# Patient Record
Sex: Male | Born: 1950 | Race: White | Hispanic: No | Marital: Married | Smoking: Never smoker
Health system: Southern US, Community
[De-identification: ages and names within clinical notes are randomized; demographics above are authoritative.]

## PROBLEM LIST (undated history)

## (undated) DIAGNOSIS — D696 Thrombocytopenia, unspecified: Secondary | ICD-10-CM

## (undated) DIAGNOSIS — G4730 Sleep apnea, unspecified: Secondary | ICD-10-CM

## (undated) DIAGNOSIS — E119 Type 2 diabetes mellitus without complications: Secondary | ICD-10-CM

## (undated) DIAGNOSIS — R51 Headache: Secondary | ICD-10-CM

## (undated) DIAGNOSIS — M1990 Unspecified osteoarthritis, unspecified site: Secondary | ICD-10-CM

## (undated) DIAGNOSIS — I4892 Unspecified atrial flutter: Secondary | ICD-10-CM

## (undated) DIAGNOSIS — N183 Chronic kidney disease, stage 3 unspecified: Secondary | ICD-10-CM

## (undated) DIAGNOSIS — E039 Hypothyroidism, unspecified: Secondary | ICD-10-CM

## (undated) DIAGNOSIS — D509 Iron deficiency anemia, unspecified: Secondary | ICD-10-CM

## (undated) DIAGNOSIS — K5790 Diverticulosis of intestine, part unspecified, without perforation or abscess without bleeding: Secondary | ICD-10-CM

## (undated) DIAGNOSIS — Z8719 Personal history of other diseases of the digestive system: Secondary | ICD-10-CM

## (undated) DIAGNOSIS — I480 Paroxysmal atrial fibrillation: Secondary | ICD-10-CM

## (undated) DIAGNOSIS — I499 Cardiac arrhythmia, unspecified: Secondary | ICD-10-CM

## (undated) DIAGNOSIS — N529 Male erectile dysfunction, unspecified: Secondary | ICD-10-CM

## (undated) DIAGNOSIS — K219 Gastro-esophageal reflux disease without esophagitis: Secondary | ICD-10-CM

## (undated) DIAGNOSIS — R519 Headache, unspecified: Secondary | ICD-10-CM

## (undated) DIAGNOSIS — K279 Peptic ulcer, site unspecified, unspecified as acute or chronic, without hemorrhage or perforation: Secondary | ICD-10-CM

## (undated) DIAGNOSIS — C801 Malignant (primary) neoplasm, unspecified: Secondary | ICD-10-CM

## (undated) DIAGNOSIS — G4733 Obstructive sleep apnea (adult) (pediatric): Secondary | ICD-10-CM

## (undated) DIAGNOSIS — I5042 Chronic combined systolic (congestive) and diastolic (congestive) heart failure: Secondary | ICD-10-CM

## (undated) DIAGNOSIS — N200 Calculus of kidney: Secondary | ICD-10-CM

## (undated) DIAGNOSIS — I10 Essential (primary) hypertension: Secondary | ICD-10-CM

## (undated) DIAGNOSIS — E785 Hyperlipidemia, unspecified: Secondary | ICD-10-CM

## (undated) DIAGNOSIS — M810 Age-related osteoporosis without current pathological fracture: Secondary | ICD-10-CM

## (undated) DIAGNOSIS — D126 Benign neoplasm of colon, unspecified: Secondary | ICD-10-CM

## (undated) DIAGNOSIS — Z8546 Personal history of malignant neoplasm of prostate: Secondary | ICD-10-CM

## (undated) DIAGNOSIS — K259 Gastric ulcer, unspecified as acute or chronic, without hemorrhage or perforation: Secondary | ICD-10-CM

## (undated) DIAGNOSIS — K5792 Diverticulitis of intestine, part unspecified, without perforation or abscess without bleeding: Secondary | ICD-10-CM

## (undated) HISTORY — DX: Thrombocytopenia, unspecified: D69.6

## (undated) HISTORY — DX: Gastric ulcer, unspecified as acute or chronic, without hemorrhage or perforation: K25.9

## (undated) HISTORY — DX: Morbid (severe) obesity due to excess calories: E66.01

## (undated) HISTORY — DX: Sleep apnea, unspecified: G47.30

## (undated) HISTORY — DX: Obstructive sleep apnea (adult) (pediatric): G47.33

## (undated) HISTORY — DX: Calculus of kidney: N20.0

## (undated) HISTORY — DX: Diverticulosis of intestine, part unspecified, without perforation or abscess without bleeding: K57.90

## (undated) HISTORY — DX: Personal history of malignant neoplasm of prostate: Z85.46

## (undated) HISTORY — DX: Diverticulitis of intestine, part unspecified, without perforation or abscess without bleeding: K57.92

## (undated) HISTORY — DX: Hyperlipidemia, unspecified: E78.5

## (undated) HISTORY — PX: OTHER SURGICAL HISTORY: SHX169

## (undated) HISTORY — PX: UPPER GASTROINTESTINAL ENDOSCOPY: SHX188

## (undated) HISTORY — PX: ATRIAL FIBRILLATION ABLATION: SHX5732

## (undated) HISTORY — DX: Hypothyroidism, unspecified: E03.9

## (undated) HISTORY — DX: Type 2 diabetes mellitus without complications: E11.9

## (undated) HISTORY — DX: Personal history of other diseases of the digestive system: Z87.19

## (undated) HISTORY — DX: Gastro-esophageal reflux disease without esophagitis: K21.9

## (undated) HISTORY — DX: Age-related osteoporosis without current pathological fracture: M81.0

## (undated) HISTORY — DX: Male erectile dysfunction, unspecified: N52.9

## (undated) HISTORY — DX: Iron deficiency anemia, unspecified: D50.9

## (undated) HISTORY — DX: Peptic ulcer, site unspecified, unspecified as acute or chronic, without hemorrhage or perforation: K27.9

## (undated) HISTORY — DX: Benign neoplasm of colon, unspecified: D12.6

## (undated) HISTORY — DX: Unspecified atrial flutter: I48.92

---

## 1975-04-12 HISTORY — PX: WRIST SURGERY: SHX841

## 1977-04-11 HISTORY — PX: KNEE ARTHROSCOPY: SUR90

## 1990-04-11 DIAGNOSIS — D696 Thrombocytopenia, unspecified: Secondary | ICD-10-CM

## 1990-04-11 HISTORY — DX: Thrombocytopenia, unspecified: D69.6

## 1992-04-11 DIAGNOSIS — K279 Peptic ulcer, site unspecified, unspecified as acute or chronic, without hemorrhage or perforation: Secondary | ICD-10-CM

## 1992-04-11 HISTORY — DX: Peptic ulcer, site unspecified, unspecified as acute or chronic, without hemorrhage or perforation: K27.9

## 1998-04-11 HISTORY — PX: OTHER SURGICAL HISTORY: SHX169

## 1999-02-02 ENCOUNTER — Ambulatory Visit (HOSPITAL_COMMUNITY): Admission: RE | Admit: 1999-02-02 | Discharge: 1999-02-02 | Payer: Self-pay | Admitting: Family Medicine

## 1999-02-02 ENCOUNTER — Encounter: Payer: Self-pay | Admitting: Family Medicine

## 2001-05-01 ENCOUNTER — Encounter: Payer: Self-pay | Admitting: Family Medicine

## 2001-05-01 ENCOUNTER — Encounter: Admission: RE | Admit: 2001-05-01 | Discharge: 2001-05-01 | Payer: Self-pay | Admitting: Family Medicine

## 2001-11-15 ENCOUNTER — Encounter: Payer: Self-pay | Admitting: Emergency Medicine

## 2001-11-16 ENCOUNTER — Inpatient Hospital Stay (HOSPITAL_COMMUNITY): Admission: EM | Admit: 2001-11-16 | Discharge: 2001-11-16 | Payer: Self-pay | Admitting: Emergency Medicine

## 2002-01-18 ENCOUNTER — Encounter: Payer: Self-pay | Admitting: Emergency Medicine

## 2002-01-18 ENCOUNTER — Emergency Department (HOSPITAL_COMMUNITY): Admission: EM | Admit: 2002-01-18 | Discharge: 2002-01-18 | Payer: Self-pay | Admitting: Emergency Medicine

## 2002-07-03 ENCOUNTER — Encounter: Payer: Self-pay | Admitting: Emergency Medicine

## 2002-07-03 ENCOUNTER — Emergency Department (HOSPITAL_COMMUNITY): Admission: EM | Admit: 2002-07-03 | Discharge: 2002-07-04 | Payer: Self-pay | Admitting: Emergency Medicine

## 2003-06-10 ENCOUNTER — Encounter: Admission: RE | Admit: 2003-06-10 | Discharge: 2003-06-10 | Payer: Self-pay | Admitting: Family Medicine

## 2004-01-01 ENCOUNTER — Encounter: Admission: RE | Admit: 2004-01-01 | Discharge: 2004-01-01 | Payer: Self-pay | Admitting: Family Medicine

## 2004-06-29 ENCOUNTER — Ambulatory Visit (HOSPITAL_COMMUNITY): Admission: RE | Admit: 2004-06-29 | Discharge: 2004-06-29 | Payer: Self-pay | Admitting: Gastroenterology

## 2005-04-07 ENCOUNTER — Inpatient Hospital Stay (HOSPITAL_COMMUNITY): Admission: AD | Admit: 2005-04-07 | Discharge: 2005-04-09 | Payer: Self-pay | Admitting: Gastroenterology

## 2005-07-26 ENCOUNTER — Emergency Department (HOSPITAL_COMMUNITY): Admission: EM | Admit: 2005-07-26 | Discharge: 2005-07-26 | Payer: Self-pay | Admitting: Emergency Medicine

## 2006-01-26 ENCOUNTER — Encounter: Admission: RE | Admit: 2006-01-26 | Discharge: 2006-01-26 | Payer: Self-pay | Admitting: Family Medicine

## 2006-04-24 ENCOUNTER — Encounter (INDEPENDENT_AMBULATORY_CARE_PROVIDER_SITE_OTHER): Payer: Self-pay | Admitting: Specialist

## 2006-04-24 ENCOUNTER — Ambulatory Visit (HOSPITAL_COMMUNITY): Admission: RE | Admit: 2006-04-24 | Discharge: 2006-04-25 | Payer: Self-pay | Admitting: Surgery

## 2006-08-23 ENCOUNTER — Emergency Department (HOSPITAL_COMMUNITY): Admission: EM | Admit: 2006-08-23 | Discharge: 2006-08-23 | Payer: Self-pay | Admitting: Emergency Medicine

## 2007-01-11 ENCOUNTER — Ambulatory Visit (HOSPITAL_COMMUNITY): Admission: RE | Admit: 2007-01-11 | Discharge: 2007-01-11 | Payer: Self-pay | Admitting: Urology

## 2007-02-10 HISTORY — PX: LAPAROSCOPIC RETROPUBIC PROSTATECTOMY: SUR794

## 2007-04-12 HISTORY — PX: TOTAL THYROIDECTOMY: SHX2547

## 2007-08-07 ENCOUNTER — Ambulatory Visit (HOSPITAL_COMMUNITY): Admission: RE | Admit: 2007-08-07 | Discharge: 2007-08-07 | Payer: Self-pay | Admitting: Family Medicine

## 2007-08-07 ENCOUNTER — Encounter (INDEPENDENT_AMBULATORY_CARE_PROVIDER_SITE_OTHER): Payer: Self-pay | Admitting: Family Medicine

## 2007-08-08 ENCOUNTER — Ambulatory Visit: Payer: Self-pay | Admitting: Vascular Surgery

## 2008-04-11 DIAGNOSIS — C801 Malignant (primary) neoplasm, unspecified: Secondary | ICD-10-CM

## 2008-04-11 HISTORY — DX: Malignant (primary) neoplasm, unspecified: C80.1

## 2008-05-09 ENCOUNTER — Encounter (INDEPENDENT_AMBULATORY_CARE_PROVIDER_SITE_OTHER): Payer: Self-pay | Admitting: Cardiology

## 2008-05-09 ENCOUNTER — Inpatient Hospital Stay (HOSPITAL_COMMUNITY): Admission: EM | Admit: 2008-05-09 | Discharge: 2008-05-15 | Payer: Self-pay | Admitting: Emergency Medicine

## 2008-05-13 ENCOUNTER — Encounter (INDEPENDENT_AMBULATORY_CARE_PROVIDER_SITE_OTHER): Payer: Self-pay | Admitting: Cardiology

## 2008-05-19 ENCOUNTER — Ambulatory Visit (HOSPITAL_COMMUNITY): Admission: RE | Admit: 2008-05-19 | Discharge: 2008-05-20 | Payer: Self-pay | Admitting: Cardiology

## 2008-05-26 ENCOUNTER — Ambulatory Visit (HOSPITAL_COMMUNITY): Admission: RE | Admit: 2008-05-26 | Discharge: 2008-05-26 | Payer: Self-pay | Admitting: Cardiology

## 2008-06-09 ENCOUNTER — Ambulatory Visit: Payer: Self-pay | Admitting: Internal Medicine

## 2009-02-10 ENCOUNTER — Emergency Department (HOSPITAL_COMMUNITY): Admission: EM | Admit: 2009-02-10 | Discharge: 2009-02-10 | Payer: Self-pay | Admitting: Emergency Medicine

## 2009-02-16 ENCOUNTER — Ambulatory Visit (HOSPITAL_BASED_OUTPATIENT_CLINIC_OR_DEPARTMENT_OTHER): Admission: RE | Admit: 2009-02-16 | Discharge: 2009-02-16 | Payer: Self-pay | Admitting: Urology

## 2009-07-10 ENCOUNTER — Ambulatory Visit: Payer: Self-pay | Admitting: Vascular Surgery

## 2010-05-02 ENCOUNTER — Encounter: Payer: Self-pay | Admitting: Family Medicine

## 2010-05-12 ENCOUNTER — Encounter: Payer: Self-pay | Admitting: Gastroenterology

## 2010-07-14 LAB — URINALYSIS, ROUTINE W REFLEX MICROSCOPIC
Leukocytes, UA: NEGATIVE
Nitrite: NEGATIVE
Specific Gravity, Urine: 1.016 (ref 1.005–1.030)

## 2010-07-14 LAB — PROTIME-INR
INR: 1.14 (ref 0.00–1.49)
Prothrombin Time: 14.5 seconds (ref 11.6–15.2)
Prothrombin Time: 21.2 seconds — ABNORMAL HIGH (ref 11.6–15.2)

## 2010-07-14 LAB — CBC: HCT: 38.2 % — ABNORMAL LOW (ref 39.0–52.0)

## 2010-07-14 LAB — URINE MICROSCOPIC-ADD ON

## 2010-07-14 LAB — BASIC METABOLIC PANEL
BUN: 12 mg/dL (ref 6–23)
BUN: 15 mg/dL (ref 6–23)
CO2: 27 mEq/L (ref 19–32)
CO2: 28 mEq/L (ref 19–32)
Calcium: 8.3 mg/dL — ABNORMAL LOW (ref 8.4–10.5)
Chloride: 100 mEq/L (ref 96–112)
Chloride: 107 mEq/L (ref 96–112)
Creatinine, Ser: 1.22 mg/dL (ref 0.4–1.5)
GFR calc Af Amer: >60 mL/min (ref >60)
GFR calc non Af Amer: >60 mL/min (ref >60)
Glucose, Bld: 132 mg/dL — ABNORMAL HIGH (ref 70–99)
Glucose, Bld: 182 mg/dL — ABNORMAL HIGH (ref 70–99)
Potassium: 2.9 mEq/L — ABNORMAL LOW (ref 3.5–5.1)
Potassium: 3.4 mEq/L — ABNORMAL LOW (ref 3.5–5.1)
Sodium: 141 mEq/L (ref 135–145)
Sodium: 141 mEq/L (ref 135–145)

## 2010-07-14 LAB — DIFFERENTIAL
Basophils Absolute: 0 10*3/uL (ref 0.0–0.1)
Basophils Relative: 0 % (ref 0–1)
Eosinophils Absolute: 0.1 10*3/uL (ref 0.0–0.7)
Eosinophils Relative: 1 % (ref 0–5)
Lymphs Abs: 1.4 10*3/uL (ref 0.7–4.0)
Monocytes Absolute: 0.8 10*3/uL (ref 0.1–1.0)
Monocytes Relative: 6 % (ref 3–12)
Neutrophils Relative %: 83 % — ABNORMAL HIGH (ref 43–77)

## 2010-07-14 LAB — APTT
aPTT: 41 seconds — ABNORMAL HIGH (ref 24–37)
aPTT: 43 seconds — ABNORMAL HIGH (ref 24–37)

## 2010-07-26 LAB — CBC
HCT: 38.1 % — ABNORMAL LOW (ref 39.0–52.0)
HCT: 41 % (ref 39.0–52.0)
Hemoglobin: 12.7 g/dL — ABNORMAL LOW (ref 13.0–17.0)
Hemoglobin: 14 g/dL (ref 13.0–17.0)
MCHC: 33.3 g/dL (ref 30.0–36.0)
MCHC: 34.1 g/dL (ref 30.0–36.0)
MCV: 90.8 fL (ref 78.0–100.0)
Platelets: 224 10*3/uL (ref 150–400)
RBC: 4.2 MIL/uL — ABNORMAL LOW (ref 4.22–5.81)
RBC: 4.26 MIL/uL (ref 4.22–5.81)
RDW: 14.9 % (ref 11.5–15.5)
RDW: 15.1 % (ref 11.5–15.5)
WBC: 10.7 10*3/uL — ABNORMAL HIGH (ref 4.0–10.5)

## 2010-07-26 LAB — APTT: aPTT: 32 seconds (ref 24–37)

## 2010-07-26 LAB — URINALYSIS, ROUTINE W REFLEX MICROSCOPIC
Glucose, UA: NEGATIVE mg/dL
Ketones, ur: 15 mg/dL — AB
Nitrite: NEGATIVE
pH: 7 (ref 5.0–8.0)

## 2010-07-26 LAB — CARDIAC PANEL(CRET KIN+CKTOT+MB+TROPI)
CK, MB: 2.3 ng/mL (ref 0.3–4.0)
CK, MB: 2.4 ng/mL (ref 0.3–4.0)
Relative Index: 1.4 (ref 0.0–2.5)
Total CK: 168 U/L (ref 7–232)

## 2010-07-26 LAB — BASIC METABOLIC PANEL
CO2: 22 mEq/L (ref 19–32)
Calcium: 8.5 mg/dL (ref 8.4–10.5)
Creatinine, Ser: 1.21 mg/dL (ref 0.4–1.5)
GFR calc Af Amer: >60 mL/min (ref >60)
GFR calc non Af Amer: >60 mL/min (ref >60)

## 2010-07-26 LAB — POCT CARDIAC MARKERS
CKMB, poc: 1.4 ng/mL (ref 1.0–8.0)
Myoglobin, poc: 201 ng/mL (ref 12–200)

## 2010-07-26 LAB — POCT I-STAT, CHEM 8
HCT: 43 % (ref 39.0–52.0)
Hemoglobin: 14.6 g/dL (ref 13.0–17.0)
Potassium: 3.7 mEq/L (ref 3.5–5.1)
Sodium: 141 mEq/L (ref 135–145)
TCO2: 18 mmol/L (ref 0–100)

## 2010-07-26 LAB — HEPARIN LEVEL (UNFRACTIONATED)
Heparin Unfractionated: 0.17 IU/mL — ABNORMAL LOW (ref 0.30–0.70)
Heparin Unfractionated: 0.21 IU/mL — ABNORMAL LOW (ref 0.30–0.70)
Heparin Unfractionated: 0.24 IU/mL — ABNORMAL LOW (ref 0.30–0.70)

## 2010-07-26 LAB — PROTIME-INR
INR: 1.2 (ref 0.00–1.49)
Prothrombin Time: 14.9 seconds (ref 11.6–15.2)
Prothrombin Time: 14.9 seconds (ref 11.6–15.2)
Prothrombin Time: 15.1 seconds (ref 11.6–15.2)

## 2010-07-26 LAB — CK TOTAL AND CKMB (NOT AT ARMC)
CK, MB: 2.8 ng/mL (ref 0.3–4.0)
Relative Index: 1.1 (ref 0.0–2.5)
Total CK: 247 U/L — ABNORMAL HIGH (ref 7–232)

## 2010-07-26 LAB — DIFFERENTIAL
Basophils Absolute: 0 10*3/uL (ref 0.0–0.1)
Eosinophils Relative: 0 % (ref 0–5)
Lymphocytes Relative: 8 % — ABNORMAL LOW (ref 12–46)
Lymphs Abs: 0.9 10*3/uL (ref 0.7–4.0)
Monocytes Absolute: 0.2 10*3/uL (ref 0.1–1.0)
Neutro Abs: 9.6 10*3/uL — ABNORMAL HIGH (ref 1.7–7.7)

## 2010-07-26 LAB — TROPONIN I: Troponin I: 0.02 ng/mL (ref 0.00–0.06)

## 2010-07-26 LAB — TSH: TSH: 1.823 u[IU]/mL (ref 0.350–4.500)

## 2010-07-27 LAB — HEPARIN LEVEL (UNFRACTIONATED)
Heparin Unfractionated: 0.21 IU/mL — ABNORMAL LOW (ref 0.30–0.70)
Heparin Unfractionated: 0.67 IU/mL (ref 0.30–0.70)
Heparin Unfractionated: 0.88 IU/mL — ABNORMAL HIGH (ref 0.30–0.70)
Heparin Unfractionated: 1.23 IU/mL — ABNORMAL HIGH (ref 0.30–0.70)

## 2010-07-27 LAB — CBC
HCT: 43.2 % (ref 39.0–52.0)
Hemoglobin: 14.9 g/dL (ref 13.0–17.0)
Hemoglobin: 15.2 g/dL (ref 13.0–17.0)
MCHC: 32.6 g/dL (ref 30.0–36.0)
MCHC: 32.8 g/dL (ref 30.0–36.0)
MCHC: 34 g/dL (ref 30.0–36.0)
MCHC: 34.2 g/dL (ref 30.0–36.0)
MCHC: 34.6 g/dL (ref 30.0–36.0)
MCHC: 34.8 g/dL (ref 30.0–36.0)
MCV: 89 fL (ref 78.0–100.0)
MCV: 89.3 fL (ref 78.0–100.0)
MCV: 89.6 fL (ref 78.0–100.0)
MCV: 89.7 fL (ref 78.0–100.0)
Platelets: 211 10*3/uL (ref 150–400)
Platelets: 230 10*3/uL (ref 150–400)
Platelets: 256 10*3/uL (ref 150–400)
RBC: 4.84 MIL/uL (ref 4.22–5.81)
RDW: 14.9 % (ref 11.5–15.5)
RDW: 14.9 % (ref 11.5–15.5)
RDW: 15 % (ref 11.5–15.5)
RDW: 15.2 % (ref 11.5–15.5)
WBC: 7.1 10*3/uL (ref 4.0–10.5)

## 2010-07-27 LAB — APTT
aPTT: 164 seconds — ABNORMAL HIGH (ref 24–37)
aPTT: 60 seconds — ABNORMAL HIGH (ref 24–37)

## 2010-07-27 LAB — BASIC METABOLIC PANEL
BUN: 15 mg/dL (ref 6–23)
CO2: 20 mEq/L (ref 19–32)
CO2: 21 mEq/L (ref 19–32)
CO2: 26 mEq/L (ref 19–32)
CO2: 27 mEq/L (ref 19–32)
Calcium: 8.8 mg/dL (ref 8.4–10.5)
Calcium: 9.5 mg/dL (ref 8.4–10.5)
Chloride: 105 mEq/L (ref 96–112)
Chloride: 108 mEq/L (ref 96–112)
Chloride: 110 mEq/L (ref 96–112)
Chloride: 110 mEq/L (ref 96–112)
Creatinine, Ser: 1.3 mg/dL (ref 0.4–1.5)
Creatinine, Ser: 1.88 mg/dL — ABNORMAL HIGH (ref 0.4–1.5)
GFR calc Af Amer: >60 mL/min (ref >60)
Glucose, Bld: 113 mg/dL — ABNORMAL HIGH (ref 70–99)
Glucose, Bld: 146 mg/dL — ABNORMAL HIGH (ref 70–99)
Potassium: 3.6 mEq/L (ref 3.5–5.1)
Potassium: 4.2 mEq/L (ref 3.5–5.1)
Sodium: 137 mEq/L (ref 135–145)
Sodium: 137 mEq/L (ref 135–145)
Sodium: 143 mEq/L (ref 135–145)

## 2010-07-27 LAB — PROTIME-INR
INR: 1.3 (ref 0.00–1.49)
INR: 2.6 — ABNORMAL HIGH (ref 0.00–1.49)
Prothrombin Time: 18.8 seconds — ABNORMAL HIGH (ref 11.6–15.2)
Prothrombin Time: 21.1 seconds — ABNORMAL HIGH (ref 11.6–15.2)
Prothrombin Time: 30.2 seconds — ABNORMAL HIGH (ref 11.6–15.2)

## 2010-08-24 NOTE — Discharge Summary (Signed)
NAME:  [name] [name], [name] [name] NO.:  [id number]   [name]          PATIENT TYPE:  INP   LOCATION:  3738                         FACILITY:  [facility]   PHYSICIAN:  Francisca December, M.D.  DATE OF BIRTH:  May 22, 1950   DATE OF ADMISSION:  05/09/2008  DATE OF DISCHARGE:  05/15/2008                               DISCHARGE SUMMARY   DISCHARGE DIAGNOSES:  1. Atrial fibrillation with rapid ventricular response, now back in      sinus rhythm.  2. 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  3. Acute systolic/acute diastolic heart failure, resolved.  4. Hypothyroidism, on replacement therapy.  5. Peptic ulcer disease on Protonix.  6. Long-term Coumadin therapy.  7. Hypertension.  8. History of stomach tumor removal, benign.  9. 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  10.Long-term medication use.   HOSPITAL COURSE:  [name] [name] is a 60 year old male patient who was  recently seen by his primary care physician for upper respiratory  symptoms that was felt to be related to reaction between Muse and  Viagra.  He was started on steroids, Ventolin, and covered with a  Zithromax Pak.  On the day of admission, he awoke with palpitations.  He  promptly saw his primary care physician and was found to be in AFib with  RVR.  He notes having intermittent palpitations over the past 2 years,  but he thought it was secondary to fluctuating thyroid levels.  Last TSH  from Dr. Larina Bras was normal.   In the emergency room, he was treated with IV heparin and IV Cardizem  with a heart rate down to the 90s.  He denied any chest pain.  He stated  he had an exercise treadmill at the office about 10 years ago that was  negative.   The patient was admitted and placed on IV Cardizem and IV heparin.  Ultimately, he required cardioversion on May 13, 2008, despite  medical therapy.  After 150 joules  synchronous biphasic shock, he  promptly reverted back to normal sinus rhythm.  His INR remained  subtherapeutic until the date of discharge, May 15, 2008, for which  his INR was 2.3.   A 2-D echo was performed that showed an EF of 45-50% but this was felt  to be a gross estimation given atrial fibrillation with RVR.  There was  a question whether or not this was tachycardia-induced cardiomyopathy.  There were no wall motion abnormalities.   Other lab studies during the patient's hospital stay included hemoglobin  of 13.1, hematocrit 37.7, white count 8.57, platelets 211.  Sodium 138,  potassium 3.9, BUN 15, creatinine 1.3.  TSH 1.823.  Cardiac markers  negative.   Chest x-ray showed diffuse pulmonary interstitial densities that are  chronic.  There was no edema.  EKG shows normal sinus rhythm, rate 65,  with no acute ST-T wave changes.   The patient is being discharged to home on May 15, 2008, in stable  but improved condition.   DISCHARGE MEDICATIONS:  1. Protonix 40 mg a day.  2. Imipramine 25 mg 3 tablets daily.  3. Levothyroxine 175 mcg a day.  4. Potassium 20 mEq daily.  5. Coumadin 5 mg 1-1/2 tablets daily.  6. Multaq 400 mg twice a day.  7. Coreg 12.5 mg p.o. twice a day.  8. Vasotec 5 mg p.o. twice a day.  9. Ambien 10 mg nightly p.r.n. sleep.   He is to continue his over-the-counter medications which include vitamin  D, glucosamine, magnesium, fish oil on a daily basis.  He is to continue  albuterol as needed for wheezing.  Viagra p.r.n.   He was on enalapril 20 mg a day and this was then switched as listed  above.  He is to stop his baby aspirin.   He is to remain on a low-sodium heart-healthy diet.  Increase activity  slowly.  He is not to return to work until May 30, 2008.  He is to  follow up in the [facility] on May 19, 2008, at 10:45 a.m.  Follow up with Dr. Deitra Mayo, nurse practitioner, on May 22, 2008, at 1:00 p.m.   An EKG will need to be done at that time.      Guy Franco, P.A.      Francisca December, M.D.  Electronically Signed    LB/MEDQ  D:  05/15/2008  T:  05/15/2008  Job:  [id number]   cc:   Dr. Larina Bras

## 2010-08-24 NOTE — Op Note (Signed)
NAME:  [name] [name], TOME NO.:  [id number]   [name]          PATIENT TYPE:  OIB   LOCATION:  2899                         FACILITY:  [facility]   PHYSICIAN:  Armanda Magic, M.D.     DATE OF BIRTH:  1950/05/28   DATE OF PROCEDURE:  05/26/2008  DATE OF DISCHARGE:  05/26/2008                               OPERATIVE REPORT   REFERRING PHYSICIAN:  Actually, I do not have the referring physician.   PROCEDURE:  Direct current cardioversion.   OPERATOR:  Armanda Magic, MD   INDICATIONS:  Atrial fibrillation, status post TEE cardioversion on  May 23, 2008, now back in atrial fibrillation.   COMPLICATIONS:  None.   IV MEDICATIONS:  Diprivan 80 mg IV.   This is a 60 year old male who has had several episodes of atrial  fibrillation and has been cardioverted twice now with reversion back to  AFib.  His last cardioversion was a week ago several days after being  TEE cardioverted.  He is adequately anticoagulated on Coumadin and now  presents back for repeat cardioversion after undergoing loading with  amiodarone.   The [facility] in the fasting nonsedated  state.  Informed consent was obtained.  The patient was connected to the  continuous heart rate, pulse oximetry monitoring, and intermittent blood  pressure monitoring.  Defibrillator pads were placed in the anterior  left chest and posterior left chest.  After adequate anesthesia was  obtained, a synchronized 150-joule biphasic shock was delivered, which  successfully converted the patient to sinus rhythm.  The patient  tolerated the procedure well without any complications.  The patient was  subsequently discharged to home after being fully awake and ambulating.   ASSESSMENT:  1. Atrial fibrillation, status post amiodarone loading with      therapeutic INR.  2. Successful cardioversion to normal sinus rhythm.  QTc is 464      milliseconds.  Heart rate is 89 beats  per minute.   PLAN:  Discharge to home after fully awake and ambulating.  Follow up  with Dr. Amil Amen on Tuesday, June 10, 2008, at 1 p.m.      Armanda Magic, M.D.  Electronically Signed     TT/MEDQ  D:  05/26/2008  T:  05/26/2008  Job:  [id number]   cc:   Francisca December, M.D.

## 2010-08-24 NOTE — Discharge Summary (Signed)
NAME:  MELIK, BLANCETT NO.:  [id number]   [name]          PATIENT TYPE:  OIB   LOCATION:  3735                         FACILITY:  [facility]   PHYSICIAN:  Francisca December, M.D.  DATE OF BIRTH:  19-Feb-1951   DATE OF ADMISSION:  05/19/2008  DATE OF DISCHARGE:  05/20/2008                               DISCHARGE SUMMARY   DISCHARGE DIAGNOSES:  1. Atrial fibrillation status post cardioversion and amiodarone      loading.  2. 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  3. History of congestive heart failure.  4. Hypothyroidism, on replacement therapy.  5. Peptic ulcer disease, on Protonix.  6. Long-term Coumadin therapy.  7. Hypertension.  8. History of stomach tumor, removed, benign.  9. 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  10.Long-term medication use.   HOSPITAL COURSE:  Mr. Virginia is a 60 year old patient who was just  released from the hospital on May 15, 2008 after being in atrial  fibrillation with RVR.  When he was discharged, he was back in normal  sinus rhythm.  During the hospitalization, he ultimately required  cardioversion despite being on IV Cardizem.  He reverted back to sinus  rhythm and was placed on Coumadin therapy.  On discharge, his INR was  2.3.   A 2-D echo was performed during that stay that showed an EF of 40-45%  but this was felt to be a gross estimation given his atrial fibrillation  with RVR.  There was a question of whether or not this was tachycardia-  induced cardiomyopathy.  There were no wall motion abnormalities.   The patient started having problem with feeling lightheaded, shortness  of breath, and palpitations and was seen again in the office on May 19, 2008.  He was found again to be in atrial fibrillation.  He was then  admitted to [facility] for cardioversion.  He was cardioverted but  ultimately required a 150 mg  bolus of amiodarone to keep him in normal  sinus rhythm.  He was kept in the hospital overnight and placed on IV  amiodarone drip and he remained in sinus rhythm overnight and therefore  we felt it was safe for him to go home the following day on an  amiodarone load.   Of note, during his stay, his creatinine was noted to be at 1.88 with a  BUN of 28 and potassium of 5.5.  This was felt to be secondary to ACE  inhibitor that had been started on his last hospitalization.  ACE  inhibitor was stopped.  He had also been on potassium replacement and  this was stopped as well.  The following day, after a general fluid  hydration, his BUN was down to 24, his creatinine was 1.66, his  potassium was down to 4.2, and his Protime was 29.9 with an INR of 2.6.   He is being discharged to home in stable but improved condition.  He is  to remain on a low-sodium heart-healthy diet.  Increase activity slowly.  Follow up at the Coumadin  Clinic on May 23, 2008 at 11:30 a.m. and  he is to follow up that same morning in our office for an adenosine  Cardiolite.  He is then to follow up with Tillman Sers, nurse  practitioner for Dr. Amil Amen on June 02, 2008 at 1:30 p.m.   DISCHARGE MEDICATIONS:  1. Amiodarone 200 mg 2 tablets p.o. b.i.d. for 1 week, then 2 tablets      p.o. daily for 1 week, then 1 p.o. daily thereafter.  2. Coumadin 5 mg a day.  3. Protonix 40 mg a day.  4. Imipramine 25 mg 3 tablets daily.  5. Levothyroxine 175 mcg a day.  6. Coreg 12.5 mg twice a day.  7. Ambien 10 mg at bedtime p.r.n. sleep.   He may resume his vitamin D, glucosamine, magnesium, fish oil,  albuterol, and Viagra as prior to admission.   DISPOSITION:  He is being discharged to home in stable but improved  condition.      Guy Franco, P.A.      Francisca December, M.D.  Electronically Signed    LB/MEDQ  D:  05/20/2008  T:  05/21/2008  Job:  [id number]   cc:   Ace Gins, MD

## 2010-08-24 NOTE — H&P (Signed)
NAME:  [name] [name], SWINGLER NO.:  [id number]   [name]          PATIENT TYPE:  INP   LOCATION:  3735                         FACILITY:  [facility]   PHYSICIAN:  Francisca December, M.D.  DATE OF BIRTH:  [birth year]   DATE OF ADMISSION:  05/09/2008  DATE OF DISCHARGE:                              HISTORY & PHYSICAL   CHIEF COMPLAINT:  Atrial fibrillation.   HISTORY OF PRESENT ILLNESS:  Mr. Stefanski is a 60 year old male patient  who was recently seen by primary care physician for upper respiratory  symptoms felt secondary to reaction between Muse and Viagra.  At that  point, he was started on steroids, Ventolin, and Zithromax PAK.  He  awoke this morning with palpitation and he saw his primary care  physician and was found to be in atrial fibrillation with RVR.  The  patient notes that he has had intermittent palpitations over this past 2  years and they thought it might be secondary to his fluctuating thyroid  levels.  His last TSH was faxed over to the emergency room and was  normal in November 2009.  He is now in the ER on IV heparin, IV  Cardizem, and the heart rate in the 90s.  He has no chest pain.  He  apparently had some type of exercise treadmill test about 10 years ago  that was reported as negative.   PAST MEDICAL HISTORY:  1. 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  2. History of erosive antral gastritis.  3. 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  4. Hypertension.  5. Prostate cancer.  No chemotherapy.  6. History of stomach tumor removal (benign).   SOCIAL HISTORY:  He lives in [place] with his wife, has several  children.  Denies tobacco, alcohol, or illicit drug use.   FAMILY HISTORY:  No family history of coronary artery disease.   REVIEW OF SYSTEMS:  No shortness of breath, palpitations.  No melena.  Review of systems otherwise negative.   ALLERGIES:  NSAID and PHENERGAN.   MEDICATIONS:  1.  Protonix 40 mg a day.  2. Potassium 20 mEq a day.  3. Enalapril 20 mg a day.  4. Imipramine 25 mg 3 tablets daily.  5. Baby aspirin daily.  6. Vitamin D daily.  7. Magnesium daily.  8. Synthroid 175 mcg daily.  9. Fish oil.  10.Glucosamine.  11.Green tea.  12.Albuterol, which was begun 2 days ago.  13.Viagra p.r.n.  14.Prednisone 40 mg a day for 5 days, started this week.   PHYSICAL EXAMINATION:  VITAL SIGNS:  Temperature 97, pulse 90-100,  respirations 16, blood pressure 113/79, O2 saturation 99% on room air.  GENERAL:  He is in no acute distress.  HEENT:  Grossly normal.  No carotid or subclavian bruits.  No JVD or  thyromegaly.  Sclerae clear.  Conjunctivae normal.  Nares without  drainage.  CHEST:  Clear to auscultation.  No wheezing or rhonchi.  HEART:  Irregularly irregular.  No gross murmur.  ABDOMEN:  Good bowel sounds.  Nontender and nondistended.  No masses.  No bruits.  LOWER EXTREMITY:  No peripheral edema.  SKIN:  Warm and dry.  NEUROLOGIC:  Cranial nerves II through XII grossly intact.  Normal mood  and affect.   Chest x-ray shows mild cardiomegaly with prominent perihilar lung base  markings.  Possible interstitial edema.   LABORATORY DATA:  Hemoglobin 14, hematocrit 41, white count 10.7,  platelets 224.  Sodium 141, potassium 3.7, BUN 17, creatinine 1.3,  glucose 174, PT 14.9, INR 1.1.  Point-of-care markers 201/1.4 with  troponin of less than 0.05.  Urinalysis negative.   EKG, AFib with RVR, rate 143, LVH.   ASSESSMENT AND PLAN:  1. Atrial fibrillation with rapid ventricular response, now rate      controlled with IV Cardizem.  2. Hypothyroidism secondary to thyroidectomy.  3. History of gastritis.  4. History of stomach tumor status post removal, benign.  5. Hypertension.  6. 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   He is currently placed on IV heparin.  We need to go ahead and start  Coumadin load.  We will keep him on IV  Cardizem overnight and titrate as  needed.  We will have a cardiac isoenzymes and check a TSH.  He will at  some point also need an echocardiogram, which will perhaps be done as an  outpatient.   This patient was seen and examined by Dr. Corliss Marcus.      Guy Franco, P.A.      Francisca December, M.D.  Electronically Signed    LB/MEDQ  D:  05/09/2008  T:  05/10/2008  Job:  [id number]   cc:   Francisca December, M.D.  James L. Malon Kindle., M.D.  Annamary Rummage

## 2010-08-24 NOTE — Letter (Signed)
June 09, 2008    [name] December, MD  [address]  [address]  [place], [place] [address]   RE:  [name] [name], [name] [name]  MRN:  [id number]  /  DOB:  1950-12-13   Dear [name] [name],   It was my pleasure to see your patient, [name] [name] in  electrophysiology consultation today regarding therapeutic strategies  for his atrial arrhythmias.  As you recall, [name] [name] is a very  pleasant 60 year old gentleman with recently diagnosed atrial  fibrillation.  The patient reports initially being diagnosed with atrial  fibrillation on May 09, 2008, after presenting to Dr. Evon Slack office for further evaluation of fatigue and decreased exercise  tolerance.  At that time, he was noted to have atrial fibrillation with  a ventricular rate in the 170 beat per minute range.  He was therefore  admitted to [facility] under your care or her was placed on  intravenous heparin and Cardizem.  He was cardioverted on May 13, 2008, successfully to sinus rhythm.  Unfortunately, he remained in sinus  rhythm for approximately 48 hours before returning to atrial  fibrillation.  He is therefore initiated on Multaq 400 mg twice daily.  He returned to your office on May 19, 2008, and was again noted to  be in atrial fibrillation.  He was therefore successfully cardioverted  to sinus rhythm after initiation of an amiodarone drip on May 20, 2008.  He notes that he remained in sinus rhythm for a week before  developing recurrent atrial fibrillation.  He again underwent  cardioversion on May 26, 2008.  He has been maintained on  amiodarone since that time.  He reports intermittent sinus rhythm but  predominantly atrial fibrillation.  He is quite clear of when he is in  sinus rhythm.  He reports symptoms of fatigue, leg weakness, decreased  exercise capacity, palpitations, and shortness of breath during atrial  fibrillation.  He is concerned that his quality of life has been  severely  impacted by atrial fibrillation.  He has been initiated on  Coumadin and has had therapeutic INRs since the beginning of February.  He denies any neurologic sequela.  He presents today for further  evaluation and management.  Upon presentation today, he is noted to have  typical-appearing atrial flutter by EKG.   PAST MEDICAL HISTORY:  1. Persistent atrial fibrillation (as above).  2. Typical-appearing atrial flutter.  3. Nonischemic cardiomyopathy (ejection fraction by 2-D echo, May 09, 2008, 45-50%).  4. Hypertension.  5. Migraines.  6. Status post thyroidectomy, January 2009, for a benign thyroid      growth.  7. Recurrent renal calculi.  8. Idiopathic thrombocytopenia purpura, 1992 which is resolved.  9. Prostate cancer, status post robotic prostatectomy at [facility].  10.Migraines.  11.Benign stomach tumor, status post surgical removal, 1994.  12.Degenerative joint disease.  13.Prior stomach ulcers.   ALLERGIES:  PHENERGAN causes itching.   CURRENT MEDICATIONS:  1. Imipramine 75 mg q.h.s.  2. Enalapril 5 mg b.i.d.  3. Digoxin 0.25 mg daily.  4. Protonix 40 mg daily.  5. Synthroid 0.175 mg daily.  6. Coumadin to maintain an INR between 2 and 3.  7. Amiodarone 200 mg twice daily.  8. Glucosamine 2000 mg daily.  9. Vitamin D 1000 mg daily.   SOCIAL HISTORY:  The patient lives in [place].  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  He denies tobacco, alcohol, or drug  use.   FAMILY HISTORY:  Notable for rheumatoid arthritis.  The patient has a  brother with atrial fibrillation at age 82.   REVIEW OF SYSTEMS:  All systems were reviewed and negative except as  outlined in the HPI above.   PHYSICAL EXAMINATION:  VITAL SIGNS:  Blood pressure 140/78, heart rate  76, respirations 18, and weight 233 pounds.  GENERAL:  The patient is a well-appearing male in no acute distress.  He  is alert and oriented x3.  HEENT:  Normocephalic  and atraumatic.  Sclerae clear.  Conjunctivae  pink.  Oropharynx clear.  NECK:  Supple.  No thyromegaly, JVD, or bruits.  LUNGS:  Clear to auscultation bilaterally.  HEART:  Irregularly irregular rhythm.  No murmurs, rubs, or gallops.  GI:  Soft, nontender, and nondistended.  Positive bowel sounds.  EXTREMITIES:  No clubbing, cyanosis, or edema.  NEUROLOGIC:  Cranial nerves II-XII are intact.  Strength and sensation  are intact.  SKIN:  No ecchymosis or lacerations.  MUSCULOSKELETAL:  No deformity or atrophy.  PSYCH:  Euthymic mood.  Full affect.   EKG today reveals typical-appearing atrial flutter with 4:1 conduction,  left axis deviation, and nonspecific ST/T-wave changes.   Transthoracic echocardiogram, May 09, 2008, reveals a left  ventricular ejection fraction of 45-50% with mild-to-moderate mitral  regurgitation.  The left ventricular end-diastolic dimension is 48, IVS  12, posterior wall 13, left atrial size 46 mm.   Transesophageal echocardiogram, May 13, 2008, reveals a left  ventricular ejection fraction of 35% with moderate diffuse left  ventricular hypokinesis.  Mild mitral regurgitation is noted with mild  dilation of the left atrium.   IMPRESSION:  [name] [name] is a pleasant 60 year old gentleman with  symptomatic typical-appearing atrial flutter and atrial fibrillation who  presents today for EP consultation.  The patient has failed medical  therapy with Multaq, digoxin, and most recently amiodarone.  He is  clinically quite symptomatic with his atrial fibrillation and therefore  would benefit from a rhythm control strategy.  He is currently  anticoagulated with Coumadin appropriately for a chance to score of 2.   PLAN:  Therapeutic strategies for atrial fibrillation and atrial flutter  were discussed in detail with the patient today including both medicine  and catheter-based therapies.  Risks, benefits, and alternatives to EP  study and radiofrequency  ablation for atrial flutter and atrial  fibrillation were also discussed in detail.  These risks include but are  not limited to bleeding, vascular damage, pericardial effusion with  tamponade, perforation, damage to the esophagus or lungs, stroke, and  pulmonary vein stenosis.  The patient understands these risks and wishes  to proceed.  We will therefore schedule the patient for a  transesophageal echocardiogram and EP study and radiofrequency ablation  for atrial fibrillation and atrial flutter at the next available time.   John, thank you for the opportunity of participating in the care of Mr.  [name].  Please feel free to contact me if you wish to discuss his care  further.    Sincerely,      Hillis Range, MD  Electronically Signed    JA/MedQ  DD: 06/09/2008  DT: 06/10/2008  Job #: [id number]   CC:    Ace Gins, MD

## 2010-08-24 NOTE — Procedures (Signed)
DUPLEX DEEP VENOUS EXAM - LOWER EXTREMITY   INDICATION:  Right lower extremity venous for pain.   HISTORY:  Edema:  No.  Trauma/Surgery:  No.  Pain:  Yes.  PE:  No.  Previous DVT:  No.  Anticoagulants:  No.  Other:   DUPLEX EXAM:                CFV   SFV   PopV  PTV    GSV                R  L  R  L  R  L  R   L  R  L  Thrombosis    o  o  o     o     o      o  Spontaneous   +  +  +     +     +      +  Phasic        +  +  +     +     +      +  Augmentation  +  +  +     +     +      +  Compressible  +  +  +     +     +      +  Competent     +  +  +     +     +      +   Legend:  + - yes  o - no  p - partial  D - decreased   IMPRESSION:  1. No evidence of deep venous thrombosis or superficial venous      thrombosis identified.  2. Baker's cyst identified in the popliteal fossa measuring 2.91 X      1.21 cm.    _____________________________  Larina Earthly, M.D.   CJ/MEDQ  D:  07/13/2009  T:  07/13/2009  Job:  [id number]

## 2010-08-24 NOTE — Op Note (Signed)
NAME:  [name] [name] NO.:  [id number]   [name]          PATIENT TYPE:  INP   LOCATION:  3738                         FACILITY:  [facility]   PHYSICIAN:  Francisca December, M.D.  DATE OF BIRTH:  [birth year]   DATE OF PROCEDURE:  DATE OF DISCHARGE:                               OPERATIVE REPORT   Elective Cardioversion   PROCEDURE PERFORMED:  Direct current cardioversion.   INDICATIONS:  Recent onset of atrial fibrillation, now with controlled  ventricular response, on p.o. metoprolol and diltiazem.  He has been  anticoagulated with IV heparin and is being orally anticoagulated with  warfarin.  Heparin level from earlier today is 0.67.  I have completed a  transesophageal echocardiogram confirming the absence of any left atrial  or left atrial appendage thrombus.  He is brought to the catheterization  laboratory now for direct current cardioversion.   PROCEDURE NOTE:  While monitoring heart rate, blood pressure, O2  saturation, and ECG, the patient received a total of 5 mg of midazolam  and 75 mg of fentanyl.  After establishing adequate moderate sedation,  the patient received a single transthoracic dose of 150 joules,  synchronized biphasic direct current energy.  This resulted in a prompt  return of sinus rhythm.  The patient had received also 150 mg of IV  amiodarone prior to the procedure.   IMPRESSION:  The patient is currently awakening without any evidence of  adverse sequelae.   PLAN:  The patient will be returned to the observation unit/telemetry  ward.  I will initiate Multaq 400 mg p.o. b.i.d., first dose ASAP.  Continue metoprolol and diltiazem with plans for outpatient assessment  of possible CAD, the exercise stress testing with myocardial perfusion  imaging.      Francisca December, M.D.  Electronically Signed     JHE/MEDQ  D:  05/13/2008  T:  05/14/2008  Job:  [id number]

## 2010-08-27 NOTE — H&P (Signed)
NAME:  [name] [name], [name] [name] NO.:  [id number]   [name]          PATIENT TYPE:  AMB   LOCATION:  DFTL                         FACILITY:  [facility]   PHYSICIAN:  Graylin Shiver, M.D.   DATE OF BIRTH:  1951/04/03   DATE OF ADMISSION:  04/07/2005  DATE OF DISCHARGE:                                HISTORY & PHYSICAL   CHIEF COMPLAINT:  Melena.   HISTORY OF PRESENT ILLNESS:  The patient is a 60 year old male, who last  evening at about 3 a.m. got very nauseated.  He went in to the bathroom.  He  did not throw up, but he did pass a melenic stool.  He did not see bright  red blood in the stool.  Dr. Randa Evens was on call last night and was called  by the wife.  Dr. Randa Evens told the wife to take the patient to the emergency  room, but she did not want to do that.  Today the patient was in contact  with Dr. Modesto Charon, who in contact then with Dr. Randa Evens and the patient still  would not go the emergency room.  It was therefore felt that he should come  over to endoscopy at [facility] to see what was going on.  He came to [facility], where I saw the patient and endoscoped him, seeing some blood clot in  the stomach.  No active bleeding and I feel that the patient should be  admitted because of upper GI bleed.   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  Other medical problems include  history of asthma in the past, history of hypertension.   MEDICATIONS:  Enalapril.   ALLERGIES:  INTOLERANT TO NONSTEROIDALS.   PRIOR SURGERIES:  As stated above.   FAMILY HISTORY:  Negative for colon cancer.   SYSTEMS REVIEW:  No chest pain or shortness of breath.   PHYSICAL:  VITAL SIGNS:  His vital signs are stable.  GENERAL:  He does not appear in any acute distress.  HEENT: Negative.  NECK:  Supple.  HEART:  Regular rhythm, no murmurs.  LUNGS:  Clear.  ABDOMEN:  Soft, non-tender, no  hepatosplenomegaly.  EXTREMITIES:  No edema.   IMPRESSION:  Upper gastrointestinal bleeding characterized by melena.  Endoscopy today did not reveal a specific lesion, but there is old blood and  clot in the stomach along with food.   PLAN:  Admit.  Place the patient on Protonix 40 mg IV b.i.d.  Re-endoscope  him tomorrow to further evaluate the stomach.           ______________________________  Graylin Shiver, M.D.     SFG/MEDQ  D:  04/07/2005  T:  04/07/2005  Job:  [id number]   cc:   Thelma Barge P. Modesto Charon, M.D.  Fax: [phone]   Llana Aliment. Malon Kindle., M.D.  Fax: [phone]

## 2010-08-27 NOTE — H&P (Signed)
NAME:  [name] [name], [name] [name] NO.:  [id number]   [name]                   PATIENT TYPE:  INP   LOCATION:  2023                                 FACILITY:  [facility]   PHYSICIAN:  Hortencia Pilar, MD                   DATE OF BIRTH:  11-Jun-1950   DATE OF ADMISSION:  11/16/2001  DATE OF DISCHARGE:                                HISTORY & PHYSICAL   CHIEF COMPLAINT:  Chest pain.   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  He had a  sharp pain right under his left nipple that went for a few seconds and went  away and then the stabbing pain came back again.  The pain lasted for about  10 minutes, then went away.  He did not have any other associated symptoms  of shortness of breath, nausea or diaphoresis.  He told his supervisor, who  immediately took him to the emergency room.  He has never had this pain  before and walks regularly without any other pain.  He denies orthopnea or  PND or any family history of early heart disease.  The rest of the review of  systems is negative.   PAST MEDICAL HISTORY:  Past medical history includes hypertension.  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   PAST SURGICAL HISTORY:  He had a benign stomach tumor removed.  He has had  multiple wrist surgeries, a bleeding ulcer and arthroscopic surgery to his  knee.   SOCIAL HISTORY:  He denies any tobacco, alcohol or drugs, present or past.   MEDICATIONS:  States he is on a potassium-sparing diuretic but he does not  know the dose or the name of the medicine.   FAMILY HISTORY:  As stated before, there is no family history of heart  disease.   PHYSICAL EXAMINATION:  VITAL SIGNS:  His blood pressure  is 127/69.  Pulse is  68.  Respiratory rate is 19.  GENERAL:  He looked well, in no acute distress, oriented x3.  HEENT:  Extraocular movements were intact.  Pupils were equal and reactive  to light.  Oropharynx is clear.  LUNGS:  Lungs were clear to auscultation bilaterally.  HEART:  He had a normal S1 and S2, regular rate and rhythm, no murmur.  ABDOMEN:  Abdomen was soft and nontender with positive bowel sounds.  EXTREMITIES:  He showed no edema.  NEUROLOGIC:  Exam was nonfocal.   LABORATORY AND ACCESSORY CLINICAL DATA:  White count of 9.1, hematocrit of  41.1, platelet count of 220,000.  Sodium of 139, potassium 2.9,  chloride of  106, CO2 of 25, BUN 21, creatinine of 1.6, glucose of 112.  Cardiac enzymes  x1 were negative.  He had normal coagulation studies.   His EKG showed normal sinus rhythm with a rate of 74 with no ST changes.    ASSESSMENT AND PLAN:  1. This is a 60 year old male with a history of hypertension, presenting     with a very good story for chest pain, exertional in nature, that     radiated to his fingers that resolved with rest.  The patient is now pain-     free with normal electrocardiogram, first set of enzymes negative.  Plan     is to rule him out with three sets of cardiac enzymes, to risk stratify     him with a noninvasive study and to continue good blood pressure and     pulse control.  2. Hypokalemia of 2.9, most likely due to diuretic.  We will give     replacement with potassium and follow potassium levels.  3. Renal insufficiency with creatinine of 1.6.  The patient gives no history     of any renal problems in the past.  Would like to get a urinalysis, urine     electrolytes and urine creatinine and follow and recheck creatinine in     the morning.                                               Hortencia Pilar, MD    SL/MEDQ  D:  11/16/2001  T:  11/20/2001  Job:  [id number]

## 2010-08-27 NOTE — Discharge Summary (Signed)
NAME:  [name] [name], [name] [name] NO.:  [id number]   [name]                   PATIENT TYPE:  INP   LOCATION:  2023                                 FACILITY:  [facility]   PHYSICIAN:  [name] Arms, MD          DATE OF BIRTH:  [birth year]   DATE OF ADMISSION:  11/15/2001  DATE OF DISCHARGE:  11/16/2001                                 DISCHARGE SUMMARY   HISTORY OF PRESENT ILLNESS:  The patient is a 60 year old white gentleman  who was admitted last night with atypical chest pain.  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  This same pain returned twice without any precipitating factors and  both times remitted spontaneously within five seconds.  With one of the  pains he thought perhaps he had some tingling in his left fingers  associated.  There was no nausea and no shortness of breath or diaphoresis  with any of these pains.  He has never had anything like this before even  when walking at a brisk pace or lifting heavy boxes.   PAST MEDICAL HISTORY:  Significant for:  1. Borderline hypertension.  2. Some mild renal insufficiency.  3. 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  4. Remote history of ITP.  5. Some arthritis.   HOSPITAL COURSE:  The patient was admitted for rule out myocardial  infarction.  He was put on telemetry and serial troponins were drawn, which  were negative.  In the morning, the patient had had no recurrence of chest  pain.  He had had no events on telemetry, no chest pain, and was feeling  quite well.  Laboratory values were significant only for a potassium of 2.9,  which was replaced orally.  His initial and subsequent EKGs were also within  normal limits.  Of note, during his emergency room stay, he was put in the  same room with a patient who subsequently turned out  to have bacterial  meningitis.  The patient was given 500 mg p.o. of Cipro to take as  prophylaxis.  On the day of discharge, the patient was afebrile with normal  vital signs, blood pressure 120/80 and pulse 60.  He was afebrile and the O2  saturation was 98% on room air.  His physical exam remained within normal  limits.  His heart exam was normal S1 and S2 with no murmurs and no rubs.  His lungs were clear.  His abdomen was soft.  There was no tenderness.  His  extremities had good pulses and were without edema.  The above physical,  laboratory values, and EKGs were discussed with the cardiologist on call,  who agreed that this atypical chest pain in a patient whose only cardiac  risk factor was being male and having borderline  hypertension, could be  worked up as an outpatient with an outpatient stress test.   DISCHARGE DIAGNOSES:  1. Atypical chest pain.  2. Hypokalemia, chronic problem.  3. Exposure to bacterial meningitis.   FOLLOW-UP:  1. The patient is to follow up with Traci R. Mayford Knife, M.D., from cardiology     on November 26, 2001.  She will have a consult and stress test.  2. He is to follow up with Thelma Barge P. Modesto Charon, M.D., next Wednesday as     previously scheduled.   DISCHARGE MEDICATIONS:  He was given a prescription and instructions to use  sublingual nitroglycerin if he were to have any recurrence of chest pain.   SPECIAL INSTRUCTIONS:  He was told to call his doctor if he had any high  fever or worsening headache, which are signs of meningitis.                                               [name] Arms, MD    AC/MEDQ  D:  11/16/2001  T:  11/21/2001  Job:  [id number]   cc:   Thelma Barge P. Modesto Charon, M.D.

## 2010-08-27 NOTE — Op Note (Signed)
NAME:  [name] [name], [name] [name] NO.:  [id number]   [name]          PATIENT TYPE:  AMB   LOCATION:  DFTL                         FACILITY:  [facility]   PHYSICIAN:  Graylin Shiver, M.D.   DATE OF BIRTH:  January 11, 1951   DATE OF PROCEDURE:  04/07/2005  DATE OF DISCHARGE:                                 OPERATIVE REPORT   INDICATIONS FOR PROCEDURE:  The patient is a 60 year old male who last night  at about 3:00 a.m. started to feel very nauseated. He then went into the  bathroom and passed a black tarry stool. Dr. Randa Evens was called late last  night about 4:00 a.m. and Dr. Randa Evens instructed the patient and his wife to  go to the emergency room. The wife did not want to take the patient to the  emergency room. The patient then got in touch with that Dr. Leodis Sias  today and then back in touch with Dr. Randa Evens and the patient was referred  over to the endoscopy unit for further evaluation at [facility].   His hemoglobin which was done stat is 13.1. His vital signs were stable. He  was not orthostatic.   Informed consent was obtained after explanation of the risks of bleeding,  infection and perforation.   PREMEDICATION:  Fentanyl 40 mcg IV, Versed 10 milligrams IV.   ADDITIONAL HISTORY:  The patient does give a history of peptic ulcer disease  and also has had a leiomyoma resected from the stomach in the past.   PROCEDURE:  After informed consent was obtained and conscious sedation  achieved, the Olympus gastroscope was inserted into the oropharynx and  passed into the esophagus. It was advanced down the esophagus and then into  the stomach and into the duodenum. The second portion and bulb of the  duodenum looked normal. The stomach had a normal-appearing antrum. There was  a large clot and retained food along the greater curvature of the stomach. I  washed this area and suctioned as much as I could but could not get rid of  all this and  could not see under it. I changed the patient's position and  placed him on his back, placed on his abdomen, but could not see adequately  under this area. I did not see anything obvious in the lesser curvature or  in the fundus or cardia. The esophagus looked normal. I did not see any  bright red blood during this exam. He tolerated the procedure well without  complications.   IMPRESSION:  Upper gastrointestinal bleeding with large clots and retained  food along the dependent portion of the stomach along the greater curvature.   PLAN:  The patient is going to be admitted to the hospital. I will follow  serial H&H's, start him on Protonix 40 milligrams IV b.i.d. and reendoscope  him tomorrow to further assess the stomach.           ______________________________  Graylin Shiver, M.D.     SFG/MEDQ  D:  04/07/2005  T:  04/07/2005  Job:  [id number]   cc:  Francis P. Modesto Charon, M.D.  Fax: [phone]   Llana Aliment. Malon Kindle., M.D.  Fax: [phone]

## 2010-08-27 NOTE — Op Note (Signed)
NAME:  [name] [name], [name] [name] NO.:  [id number]   [name]          PATIENT TYPE:  OIB   LOCATION:  5727                         FACILITY:  [facility]   PHYSICIAN:  Velora Heckler, MD      DATE OF BIRTH:  Mar 14, 1951   DATE OF PROCEDURE:  04/24/2006  DATE OF DISCHARGE:                               OPERATIVE REPORT   PREOPERATIVE DIAGNOSIS:  Bilateral thyroid nodules.   POSTOPERATIVE DIAGNOSIS:  Bilateral thyroid nodules.   PROCEDURE:  Total thyroidectomy.   SURGEON:  Velora Heckler, MD, FACS   ASSISTANT:  Cyndia Bent, MD, FACS   ANESTHESIA:  General per Dr. Arta Bruce.   ESTIMATED BLOOD LOSS:  Minimal.   PREPARATION:  Betadine.   COMPLICATIONS:  None.   INDICATIONS:  The patient is a 60 year old white male from [place],  [place].  On a recent carotid duplex screening examination, he  was noted to have bilateral thyroid nodules.  The patient subsequently  underwent diagnostic thyroid ultrasound, which showed a dominant mass  occupying approximately 90% of the left thyroid lobe.  Also multiple  nodules were present in the right thyroid lobe.  The patient was  referred to Dr. Jerelene Redden.  The patient was then referred to my  practice for consideration for total thyroidectomy.   BODY OF REPORT:  The procedure is done in OR #16 at the [facility].  The patient is brought to the operating room, placed  in supine position on the operating room table.  Following  administration of general anesthesia, the patient is positioned and then  prepped and draped in the usual strict aseptic fashion.  After  ascertaining that an adequate level of anesthesia had been obtained, a  Kocher incision is made with a #15 blade.  Dissection is carried through  subcutaneous tissues and platysma.  Skin flaps are elevated cephalad and  caudad from the thyroid notch to the sternal notch.  A Mahorner self-  retaining retractor is  placed for exposure.  Strap muscles are incised  in the midline.  Dissection is begun on the left side.  Strap muscles  are reflected laterally.  Left thyroid lobe is exposed.  It is gently  dissected out.  Venous tributaries are divided with the Harmonic  scalpel.  Using a Barista, the entire lobe is exposed.  Superior pole vessels are dissected out, ligated in continuity with 2-0  silk ties and medium Ligaclips, and divided.  Gland is rolled  anteriorly.  Parathyroid tissue is identified and preserved.  Inferior  venous tributaries are divided with the Harmonic scalpel.  Branches of  the inferior thyroid artery are divided between small Ligaclips.  Ligament of Allyson Sabal is incised in the left lobe is mobilized anteriorly up  and onto the anterior surface of the trachea.  A small pyramidal lobe is  dissected out with the Harmonic scalpel and included with the specimen.  Isthmus is mobilized across the midline.  A dry pack is placed in the  left neck.   Next we turned our attention to the right  thyroid lobe.  While the left  lobe was dominated by a single large mass, the right lobe contains  multiple firm nodules.  It is gently mobilized with a Barista.  Venous tributaries are divided with the Harmonic scalpel.  Superior pole  vessels are ligated in continuity with 2-0 silk ties and medium  Ligaclips and divided.  Gland is rolled anteriorly.  Inferior venous  tributaries are divided with the Harmonic scalpel.  Branches of the  inferior thyroid artery are divided with small Ligaclips used for  ligature.  Gland is rolled further anteriorly.  Parathyroid tissue is  identified and preserved.  Recurrent nerve is identified and preserved.  Ligament of Allyson Sabal was incised with the electrocautery and the gland is  rolled up and onto the anterior trachea.  The remainder of the gland is  excised off the anterior trachea and from the central compartment using  the Harmonic scalpel.  The  specimen  is marked with a suture in the left  superior pole.  The entire thyroid is then submitted to pathology for  review.  Neck is irrigated bilaterally.  Good hemostasis is noted.  Surgicel is placed over the area of the recurrent nerve and parathyroid  glands bilaterally.  Strap muscles are reapproximated midline with  interrupted 3-0 Vicryl sutures.  Platysma is closed with interrupted 3-0  Vicryl sutures.  Skin is closed with a running 4-0 Vicryl subcuticular  suture.  Wound is washed and dried and Benzoin and Steri-Strips were  applied.  Sterile dressings are applied.  The patient is awakened from  anesthesia and brought to the recovery room in stable condition.  The  patient tolerated the procedure well.      Velora Heckler, MD  Electronically Signed     TMG/MEDQ  D:  04/24/2006  T:  04/25/2006  Job:  [id number]   cc:   Talmadge Coventry, M.D.

## 2010-08-27 NOTE — Op Note (Signed)
NAME:  [name], [name] [name] NO.:  [id number]   [name]          PATIENT TYPE:  INP   LOCATION:  5715                         FACILITY:  [facility]   PHYSICIAN:  Graylin Shiver, M.D.   DATE OF BIRTH:  08/20/1950   DATE OF PROCEDURE:  04/08/2005  DATE OF DISCHARGE:                                 OPERATIVE REPORT   INDICATIONS FOR PROCEDURE:  Upper GI.  The patient underwent endoscopy  yesterday there was lot of food and clot in the stomach and I could not see  the stomach and well. Endoscopy is being repeated today.   Informed consent was obtained after explanation of the risks of bleeding,  infection and perforation.   PREMEDICATION:  Fentanyl 40 mcg IV, Versed 6 mg IV.   PROCEDURE:  With the patient in the left lateral decubitus position, the  Olympus gastroscope was inserted into the oropharynx and passed into the  esophagus. It was advanced down the esophagus, then into the stomach and  into the duodenum. The second portion and bulb of the duodenum were normal.  The stomach showed erosive linear antral gastritis. Also in the stomach,  there was a 6 mm ulcer on the greater curvature at the junction of the  antrum and the folds of the greater curvature. There was a small dark spot  on the center. There was no bleeding. I washed this area and there was no  bleeding. The small dark spot was flat. No therapeutic intervention was felt  needed to be done at this time. The rest of the stomach looked normal. The  esophagus looked normal. He tolerated the procedure well without  complications.   IMPRESSION:  1.  Erosive antral gastritis.  2.  A 6 mm ulcer on the greater curvature at the junction of the antrum and      the fold of the greater curvature.   PLAN:  Continue proton pump inhibitor. Advance diet. I would recommend  repeating endoscopy in 8 weeks to make sure that this gastric ulcer healed.           ______________________________  Graylin Shiver, M.D.     SFG/MEDQ  D:  04/08/2005  T:  04/08/2005  Job:  [id number]

## 2010-08-27 NOTE — Op Note (Signed)
NAME:  [name] [name], [name] [name] NO.:  [id number]   [name]          PATIENT TYPE:  AMB   LOCATION:  ENDO                         FACILITY:  [facility]   PHYSICIAN:  James L. Malon Kindle., M.D.DATE OF BIRTH:  1950/08/02   DATE OF PROCEDURE:  06/29/2004  DATE OF DISCHARGE:                                 OPERATIVE REPORT   PROCEDURE:  Colonoscopy.   MEDICATIONS:  Fentanyl 100 mcg, Versed 10 mg IV.   SCOPE:  Olympus pediatric adjustable colonoscope.   INDICATIONS FOR PROCEDURE:  Colon cancer screening.   DESCRIPTION OF PROCEDURE:  The procedure had been explained to the patient  and consent obtained. With the patient in the left lateral decubitus  position, the Olympus pediatric adjustable scope was inserted following  digital exam and advanced under direct visualization. The prep was  excellent. We were able to reach the cecum using a slight amount of  abdominal pressure. The ileocecal valve and appendiceal orifice were seen.  The scope was withdrawn and the cecum, ascending colon, transverse colon,  splenic flexure, descending and sigmoid colon were seen well. No polyps or  other lesions were seen. There was some diverticular disease that was really  fairly minimal.  The scope was withdrawn. The rectum was free of polyps.   ASSESSMENT:  1.  Normal screening colonoscopy, V76.51.  2.  Diverticulosis of mild nature, 562.10.   PLAN:  Will give a diverticulosis information sheet. Will recommend yearly  Hemoccult's, possibly repeat procedure in 10 years.      JLE/MEDQ  D:  06/29/2004  T:  06/29/2004  Job:  [id number]   cc:   Maryla Morrow. Modesto Charon, M.D.  [address]  [place]  [place] [address]  Fax: [phone]

## 2010-12-09 ENCOUNTER — Emergency Department (HOSPITAL_COMMUNITY): Payer: Managed Care, Other (non HMO)

## 2010-12-09 ENCOUNTER — Emergency Department (HOSPITAL_COMMUNITY)
Admission: EM | Admit: 2010-12-09 | Discharge: 2010-12-09 | Disposition: A | Discharge status: Home / Self Care | Payer: Managed Care, Other (non HMO) | Attending: Emergency Medicine | Admitting: Emergency Medicine

## 2010-12-09 DIAGNOSIS — R0789 Other chest pain: Secondary | ICD-10-CM | POA: Insufficient documentation

## 2010-12-09 DIAGNOSIS — R002 Palpitations: Secondary | ICD-10-CM | POA: Insufficient documentation

## 2010-12-09 DIAGNOSIS — R0989 Other specified symptoms and signs involving the circulatory and respiratory systems: Secondary | ICD-10-CM | POA: Insufficient documentation

## 2010-12-09 DIAGNOSIS — I4891 Unspecified atrial fibrillation: Secondary | ICD-10-CM | POA: Insufficient documentation

## 2010-12-09 DIAGNOSIS — R5381 Other malaise: Secondary | ICD-10-CM | POA: Insufficient documentation

## 2010-12-09 DIAGNOSIS — I2510 Atherosclerotic heart disease of native coronary artery without angina pectoris: Secondary | ICD-10-CM | POA: Insufficient documentation

## 2010-12-09 DIAGNOSIS — E876 Hypokalemia: Secondary | ICD-10-CM | POA: Insufficient documentation

## 2010-12-09 DIAGNOSIS — R0602 Shortness of breath: Secondary | ICD-10-CM | POA: Insufficient documentation

## 2010-12-09 DIAGNOSIS — Z8546 Personal history of malignant neoplasm of prostate: Secondary | ICD-10-CM | POA: Insufficient documentation

## 2010-12-09 DIAGNOSIS — I509 Heart failure, unspecified: Secondary | ICD-10-CM | POA: Insufficient documentation

## 2010-12-09 DIAGNOSIS — R0609 Other forms of dyspnea: Secondary | ICD-10-CM | POA: Insufficient documentation

## 2010-12-09 DIAGNOSIS — I10 Essential (primary) hypertension: Secondary | ICD-10-CM | POA: Insufficient documentation

## 2010-12-09 DIAGNOSIS — Z79899 Other long term (current) drug therapy: Secondary | ICD-10-CM | POA: Insufficient documentation

## 2010-12-09 LAB — CBC
HCT: 39.1 % (ref 39.0–52.0)
Hemoglobin: 14.1 g/dL (ref 13.0–17.0)
MCHC: 36.1 g/dL — ABNORMAL HIGH (ref 30.0–36.0)
RDW: 13.9 % (ref 11.5–15.5)
WBC: 8.3 10*3/uL (ref 4.0–10.5)

## 2010-12-09 LAB — DIFFERENTIAL
Basophils Absolute: 0 10*3/uL (ref 0.0–0.1)
Basophils Relative: 0 % (ref 0–1)
Lymphocytes Relative: 28 % (ref 12–46)
Neutro Abs: 5 10*3/uL (ref 1.7–7.7)

## 2010-12-09 LAB — BASIC METABOLIC PANEL
BUN: 20 mg/dL (ref 6–23)
Chloride: 104 mEq/L (ref 96–112)
GFR calc Af Amer: >60 mL/min (ref >60)
GFR calc non Af Amer: 59 mL/min — ABNORMAL LOW (ref >60)
Glucose, Bld: 97 mg/dL (ref 70–99)
Potassium: 3 mEq/L — ABNORMAL LOW (ref 3.5–5.1)
Sodium: 140 mEq/L (ref 135–145)

## 2010-12-09 LAB — POCT I-STAT TROPONIN I: Troponin i, poc: 0.04 ng/mL (ref 0.00–0.08)

## 2011-01-08 ENCOUNTER — Emergency Department (HOSPITAL_COMMUNITY): Payer: Managed Care, Other (non HMO)

## 2011-01-08 ENCOUNTER — Inpatient Hospital Stay (HOSPITAL_COMMUNITY)
Admission: EM | Admit: 2011-01-08 | Discharge: 2011-01-10 | DRG: 555 | Disposition: A | Discharge status: Home / Self Care | Payer: Managed Care, Other (non HMO) | Source: Ambulatory Visit | Attending: Interventional Cardiology | Admitting: Interventional Cardiology

## 2011-01-08 DIAGNOSIS — E876 Hypokalemia: Secondary | ICD-10-CM | POA: Diagnosis present

## 2011-01-08 DIAGNOSIS — M79609 Pain in unspecified limb: Principal | ICD-10-CM | POA: Diagnosis present

## 2011-01-08 DIAGNOSIS — I4891 Unspecified atrial fibrillation: Secondary | ICD-10-CM | POA: Diagnosis present

## 2011-01-08 DIAGNOSIS — T463X5A Adverse effect of coronary vasodilators, initial encounter: Secondary | ICD-10-CM | POA: Diagnosis present

## 2011-01-08 DIAGNOSIS — I509 Heart failure, unspecified: Secondary | ICD-10-CM | POA: Diagnosis present

## 2011-01-08 DIAGNOSIS — G43909 Migraine, unspecified, not intractable, without status migrainosus: Secondary | ICD-10-CM | POA: Diagnosis present

## 2011-01-08 DIAGNOSIS — I5033 Acute on chronic diastolic (congestive) heart failure: Secondary | ICD-10-CM | POA: Diagnosis present

## 2011-01-08 DIAGNOSIS — I428 Other cardiomyopathies: Secondary | ICD-10-CM | POA: Diagnosis present

## 2011-01-08 DIAGNOSIS — R0789 Other chest pain: Secondary | ICD-10-CM | POA: Diagnosis present

## 2011-01-08 DIAGNOSIS — I10 Essential (primary) hypertension: Secondary | ICD-10-CM | POA: Diagnosis present

## 2011-01-08 LAB — CBC
HCT: 40.1 % (ref 39.0–52.0)
Hemoglobin: 14.2 g/dL (ref 13.0–17.0)
RBC: 4.53 MIL/uL (ref 4.22–5.81)

## 2011-01-08 LAB — COMPREHENSIVE METABOLIC PANEL
ALT: 20 U/L (ref 0–53)
AST: 15 U/L (ref 0–37)
Albumin: 3.4 g/dL — ABNORMAL LOW (ref 3.5–5.2)
Alkaline Phosphatase: 85 U/L (ref 39–117)
Calcium: 9.4 mg/dL (ref 8.4–10.5)
GFR calc Af Amer: >60 mL/min (ref >60)
Glucose, Bld: 149 mg/dL — ABNORMAL HIGH (ref 70–99)
Potassium: 2.9 mEq/L — ABNORMAL LOW (ref 3.5–5.1)
Sodium: 141 mEq/L (ref 135–145)
Total Protein: 6.4 g/dL (ref 6.0–8.3)

## 2011-01-08 LAB — DIFFERENTIAL
Basophils Absolute: 0 10*3/uL (ref 0.0–0.1)
Basophils Relative: 0 % (ref 0–1)
Lymphocytes Relative: 24 % (ref 12–46)
Monocytes Absolute: 0.7 10*3/uL (ref 0.1–1.0)
Neutro Abs: 7 10*3/uL (ref 1.7–7.7)
Neutrophils Relative %: 68 % (ref 43–77)

## 2011-01-08 LAB — POCT I-STAT TROPONIN I: Troponin i, poc: 0.02 ng/mL (ref 0.00–0.08)

## 2011-01-08 LAB — POCT I-STAT, CHEM 8
BUN: 22 mg/dL (ref 6–23)
Chloride: 108 mEq/L (ref 96–112)
Glucose, Bld: 144 mg/dL — ABNORMAL HIGH (ref 70–99)
HCT: 41 % (ref 39.0–52.0)
Potassium: 2.9 mEq/L — ABNORMAL LOW (ref 3.5–5.1)

## 2011-01-08 LAB — PROTIME-INR
INR: 0.95 (ref 0.00–1.49)
Prothrombin Time: 12.9 seconds (ref 11.6–15.2)

## 2011-01-08 MED ORDER — IOHEXOL 350 MG/ML SOLN: 150.0000 mL | Freq: Once | INTRAVENOUS | Status: AC | PRN

## 2011-01-09 ENCOUNTER — Inpatient Hospital Stay (HOSPITAL_COMMUNITY): Payer: Managed Care, Other (non HMO)

## 2011-01-09 LAB — LIPID PANEL
Cholesterol: 214 mg/dL — ABNORMAL HIGH (ref 0–200)
Total CHOL/HDL Ratio: 4.8 RATIO
Triglycerides: 271 mg/dL — ABNORMAL HIGH (ref <150)

## 2011-01-09 LAB — CARDIAC PANEL(CRET KIN+CKTOT+MB+TROPI)
CK, MB: 4.6 ng/mL — ABNORMAL HIGH (ref 0.3–4.0)
Relative Index: 4.6 — ABNORMAL HIGH (ref 0.0–2.5)
Total CK: 101 U/L (ref 7–232)
Troponin I: <0.3 ng/mL (ref <0.30)

## 2011-01-09 LAB — HEMOGLOBIN A1C: Hgb A1c MFr Bld: 6.4 % — ABNORMAL HIGH (ref <5.7)

## 2011-01-09 LAB — CK TOTAL AND CKMB (NOT AT ARMC): Relative Index: 4.2 — ABNORMAL HIGH (ref 0.0–2.5)

## 2011-01-09 LAB — CBC
Hemoglobin: 13.5 g/dL (ref 13.0–17.0)
RBC: 4.41 MIL/uL (ref 4.22–5.81)

## 2011-01-09 LAB — TROPONIN I: Troponin I: <0.3 ng/mL (ref <0.30)

## 2011-01-09 LAB — BASIC METABOLIC PANEL
Calcium: 9.1 mg/dL (ref 8.4–10.5)
Chloride: 107 mEq/L (ref 96–112)
Creatinine, Ser: 1.13 mg/dL (ref 0.50–1.35)
GFR calc Af Amer: >60 mL/min (ref >60)
Sodium: 142 mEq/L (ref 135–145)

## 2011-01-10 ENCOUNTER — Other Ambulatory Visit (HOSPITAL_COMMUNITY): Payer: Managed Care, Other (non HMO)

## 2011-01-10 ENCOUNTER — Inpatient Hospital Stay (HOSPITAL_COMMUNITY): Payer: Managed Care, Other (non HMO)

## 2011-01-10 LAB — CBC
HCT: 38 % — ABNORMAL LOW (ref 39.0–52.0)
Hemoglobin: 13.4 g/dL (ref 13.0–17.0)
MCV: 88.4 fL (ref 78.0–100.0)
Platelets: 201 10*3/uL (ref 150–400)
RBC: 4.3 MIL/uL (ref 4.22–5.81)
WBC: 7.4 10*3/uL (ref 4.0–10.5)

## 2011-01-10 LAB — BASIC METABOLIC PANEL
BUN: 13 mg/dL (ref 6–23)
CO2: 26 mEq/L (ref 19–32)
Chloride: 105 mEq/L (ref 96–112)
GFR calc Af Amer: >90 mL/min (ref >90)
Glucose, Bld: 115 mg/dL — ABNORMAL HIGH (ref 70–99)
Potassium: 3 mEq/L — ABNORMAL LOW (ref 3.5–5.1)

## 2011-01-10 MED ORDER — TECHNETIUM TC 99M TETROFOSMIN IV KIT
30.0000 | PACK | Freq: Once | INTRAVENOUS | Status: AC | PRN
  Administered 2011-01-10: 30 via INTRAVENOUS

## 2011-01-10 MED ORDER — TECHNETIUM TC 99M TETROFOSMIN IV KIT
10.0000 | PACK | Freq: Once | INTRAVENOUS | Status: AC | PRN
  Administered 2011-01-10: 10 via INTRAVENOUS

## 2011-01-28 NOTE — H&P (Signed)
NAME:  [name] [name], [name] [name] NO.:  [id number]  [name]  LOCATION:  3731                         FACILITY:  [facility]  PHYSICIAN:  Armanda Magic, M.D.     DATE OF BIRTH:  [birth year]  DATE OF ADMISSION:  01/08/2011 DATE OF DISCHARGE:                             HISTORY & PHYSICAL   REFERRING PHYSICIAN:  Dr. Rubin Payor.  PRIMARY CARDIOLOGIST:  Dr. Eldridge Dace.  CHIEF COMPLAINT:  Left arm pain.  HISTORY OF PRESENT ILLNESS:  This is a 60 year old male with a history paroxysmal atrial fibrillation, congestive heart failure, hypertension who presented to the emergency room with complaints of palpitations and chest discomfort.  He says his heart went out of rhythm at 11:00 a.m. and then he started having left arm pain and chest heaviness which lasted constant for 4-5 hours, associated with diaphoresis and nausea. He denied any shortness of breath.  He was given sublingual nitroglycerin without improvement.  His chest pressure did resolve though with morphine.  He has normal sinus rhythm.  He still complains of some mild left arm discomfort.  PAST MEDICAL HISTORY: 1. Hypertension. 2. Atrial fibrillation. 3. Acute systolic heart failure, EF 35%, tachycardia induced, in     February 2010.  His EF on Cardiolite in March 2010 showed EF of     55%. 4. He has a history nephrolithiasis.  ALLERGIES:  NSAIDs which cause ulcers, ASPIRIN causes ulcers, STEROIDS, PHENERGAN causes itching and hallucinations, CALCIUM CHANNEL BLOCKERS cause lower extremity edema, BYSTOLIC cause bradycardia.  SOCIAL HISTORY:  He denies any tobacco or alcohol use.  No IV drug use. He does drink caffeine and is married.  PAST SURGICAL HISTORY: 1. Wrist surgery. 2. Knee surgery. 3. Benign stomach tumor removal. 4. Thyroidectomy for cyst. 5. Prostatectomy. 6. Kidney stone removal.  REVIEW OF SYSTEMS:  Other than what is stated in HPI is negative.  MEDICATIONS: 1. Protonix 40 mg  daily. 2. Imipramine 25 mg t.i.d. 3. Levothyroxine 200 mcg daily. 4. Ambien 10 mg nightly p.r.n. 5. Fish oil 1000 mg daily. 6. Vitamin D 1000 units daily. 7. Metformin 500 mg b.i.d. 8. Bentyl 20 mg t.i.d. p.r.n. 9. Viagra 100 mg daily p.r.n. 10.Carvedilol 12.5 mg b.i.d. 11.Multaq 400 mg b.i.d. 12.Klor-Con 20 mEq q.i.d. 13.Hydrochlorothiazide 12.5 mg 2 tablets daily. 14.Ramipril 10 mg b.i.d.  PHYSICAL EXAMINATION:  VITAL SIGNS:  Blood pressure is 114/86, heart rate 100, O2 saturation 98% on room air. GENERAL:  This is a well-developed, well-nourished male, in no acute distress. HEENT:  Benign. NECK:  Supple without lymphadenopathy.  Carotid upstrokes +2 bilaterally.  No bruits. LUNGS:  Clear to auscultation throughout. HEART:  Regular rate and rhythm.  No murmurs, rubs, or gallops.  Normal S1 and S2. ABDOMEN:  Soft, nontender, nondistended.  Normoactive bowel sounds.  No hepatosplenomegaly. EXTREMITIES:  No edema.  LABORATORY DATA:  Sodium 141, potassium 2.9, chloride 106, bicarb 25, BUN 21, creatinine 1.23.  White cell count 10.3, hemoglobin 14.2, hematocrit 40.1, platelet count 211.  LFTs are normal.  BNP 907, troponin 0.02, INR 0.95.  EKG shows sinus rhythm with PACs. Chest x-ray shows no active disease. Chest CT shows no aortic dissection.  ASSESSMENT: 1. Chest pain,  atypical with left arm pain.  No improvement with     sublingual nitroglycerin but did improve with morphine. 2. Atrial fibrillation in normal sinus rhythm, on Multaq. 3. Hypokalemia. 4. Tachycardia-induced cardiomyopathy, resolved.  PLAN:  Admit to telemetry bed.  Cycle cardiac enzymes.  We will start IV heparin drip.  Until rule out is complete, we will continue current medications.  If enzymes are negative x3, then we will plan Lexiscan Cardiolite per Dr. Eldridge Dace.     Armanda Magic, M.D.     TT/MEDQ  D:  01/09/2011  T:  01/09/2011  Job:  [id number]  cc:   Corky Crafts,  MD  Electronically Signed by Armanda Magic M.D. on 01/28/2011 08:21:43 PM

## 2011-02-03 NOTE — Consult Note (Signed)
NAME:  [name] [name], [name] [name] NO.:  [id number]  [name]  LOCATION:  3731                         FACILITY:  [facility]  PHYSICIAN:  Levie Heritage, MD       DATE OF BIRTH:  1950-10-19  DATE OF CONSULTATION:  01/09/2011 DATE OF DISCHARGE:                                CONSULTATION   CHIEF COMPLAINT:  Severe headache.  HISTORY OF PRESENT ILLNESS:  This is a 60 year old white male who was admitted to [facility] on January 08, 2011, with atypical chest pain. He has ruled out for MI.    This morning around 10 a.m. he developed a severe headache which he states  is worse than his normal migraine headaches for which he takes imipramine.   This medication has allowed him to be migraine free for years.  He did not get his dose of imipramine yesterday.    Head CT performed today was negative for mass or hemorrhage.  The patient is on nitro paste for atypical chest pain.  Neuro consult requested for HA eval.  PAST MEDICAL HISTORY: 1. Migraines. 2. Hypertension. 3. AFib/A-flutter. 4. Nonischemic cardiomyopathy. 5. ITP 1992. 6. Prostate cancer status post resection. 7. Iatrogenic hypothyroidism status post thyroidectomy (benign). 8. GERD with history of peptic ulcer disease.  MEDICATIONS: 1. Aspirin 81 mg a day. 2. Coreg 12.5 mg b.i.d. 3. Dronedarone 400 mg b.i.d. 4. Hydrochlorothiazide 25 mg a day. 5. Tofranil 25 mg t.i.d. 6. Levothyroxine 200 mcg a day. 7. KCl 20 mEq q.i.d. 8. Altace 10 mg b.i.d. 9. Protonix 40 mg a day. 10.Nitroglycerin ointment 1 inch applied as directed.  ALLERGIES: 1. ASPIRIN causing peptic ulcer. 2. NSAIDS causing peptic ulcer. 3. PHENERGAN.  FAMILY HISTORY:  Noncontributory to this consultation.  SOCIAL HISTORY:  The patient is married.  No alcohol, tobacco or illicit drug use.  Does drink caffeine.  REVIEW OF SYSTEMS:  See HPI.  The patient denies double vision or blurred vision.  He does have photophobia.   Vomiting today with headache.  No recent GI bleed per patient.  No present chest pain or shortness of breath.  No syncope or presyncope. NO weakess in arms or legs.  PHYSICAL EXAMINATION:  VITAL SIGNS:  Temperature is 98, blood pressure 153/97, pulse is 89, respirations 20, SAO2 97% on room air. GENERAL:  The patient is alert and oriented x3, in distress due to headache.  Speech is intact, language is fluent.  PERRL EOMI VFFI Conjugate gaze, no nystagmus.  Smile symmetric, tongue midline, shrug intact.  Grips are strong and equal.  Motor is 5/5 x4 extremities.   Sensation is intact to light touch throughout. DTR 2+ throughout.  Finger to nose, heel to shin intact.    LABORATORY DATA:  White blood cell is 8.9, hemoglobin 13.5, and platelet 207.  Sodium 142, potassium 3.4, BUN 18, creatinine 1.13.  Glucose 131. A1c 6.4.  Cardiac enzymes negative x2.  INR 0.95.  BNP 905.3.  Total cholesterol 214, triglyceride 271, HDL 45, and LDL 115.  Head CT was unremarkable.  IMPRESSION:  Known migraine headaches in this 60 year old gentleman which we suspect was triggered by nitroglycerin paste and stopping imipramine.  No  acute neurologic deficit on exam.  CT head unremarkable.  PLAN: 1. Discontinue nitro paste.  (Dr. Hoy Morn discussed with Dr. Mayford Knife and     she is in agreement with this plan). 2. Resume imipramine at home dose of 75 mg at bedtime. 3. Depakote 500 mg IV slowly x1 now ( for HA). 4. IV morphine 2 mg x1. 5. No additional recommendations.  Please call back if any further     questions.  Thank you for allowing [place] to assist in the management of this patient.     Luan Moore, P.A.  I have seen the patient and agree with above clinical findings.  ______________________________ Levie Heritage, MD    TCJ/MEDQ  D:  01/09/2011  T:  01/09/2011  Job:  [id number]  Electronically Signed by Delice Bison JERNEJCIC P.A. on 01/15/2011 12:30:40 PM Electronically Signed by Levie Heritage MD on  02/03/2011 12:18:05 PM

## 2011-02-07 NOTE — Discharge Summary (Signed)
  NAME:  CHAUNCE, WINKELS NO.:  [id number]  [name]  LOCATION:  3731                         FACILITY:  [facility]  PHYSICIAN:  Corky Crafts, MDDATE OF BIRTH:  [birth year]  DATE OF ADMISSION:  01/08/2011 DATE OF DISCHARGE:  01/10/2011                              DISCHARGE SUMMARY   FINAL DIAGNOSES: 1. Noncardiac chest pain. 2. Atrial fibrillation. 3. Migraine headache.  PROCEDURE PERFORMED:  Cardiolite stress test showing normal LV function. No ischemia.  HOSPITAL COURSE:  The patient was admitted with arm pain.  He is found to have a low potassium.  His potassium was supplemented.  It was just thought due to his diuretic.  He had been on a potassium sparing diuretics in the past.  He had a stress test due to the left arm pain and there was no evidence of ischemia.  After receiving nitroglycerin on the hospital, he developed a severe headache.  He has had migraines inthe past, so the Neurology consult that he was obtained and the patient was managed with Depakote.  His headache resolved.  By the time he was ready to go home he was feeling well.  We had a long discussion regarding the diuretic choice.  He is going to switch to Maxzide and we will recheck his potassium on Friday to make sure that it is adequate. He thinks some of the symptoms may have been due to low potassium.  DISCHARGE MEDICATIONS: 1. Dronedarone 400 mg p.o. b.i.d. 2. Aspirin 81 mg daily. 3. Fish oil. 4. Multivitamin. 5. Vitamin D. 6. Vitamin E. 7. Glucosamine. 8. Carvedilol 12.5 mg daily. 9. Metformin 500 mg p.o. b.i.d. 10.Potassium 20 mEq tablets 3 tablets daily. 11.Ramipril 10 mg p.o. b.i.d. 12.Levothyroxine 200 mcg daily. 13.Hydrochlorothiazide is to be stopped. 14.Imipramine 25 mg nightly. 15.We will start Maxzide 37.5/25 one tablet daily.  FOLLOWUP APPOINTMENTS:  He will follow up with Dr. Hoyle Barr office to get a potassium check on Friday October  5.  ACTIVITY:  Increase activity slowly.  No other limitations.  DIET:  Low-sodium heart-healthy diet.     Corky Crafts, MD     JSV/MEDQ  D:  01/13/2011  T:  01/13/2011  Job:  [id number]  Electronically Signed by Lance Muss MD on 02/07/2011 01:20:14 PM

## 2011-07-22 ENCOUNTER — Encounter: Payer: Self-pay | Admitting: Physician Assistant

## 2011-08-18 ENCOUNTER — Encounter: Payer: Self-pay | Admitting: Internal Medicine

## 2011-08-23 ENCOUNTER — Other Ambulatory Visit: Payer: Self-pay | Admitting: Interventional Cardiology

## 2011-08-25 ENCOUNTER — Encounter (HOSPITAL_COMMUNITY)
Admission: RE | Disposition: A | Discharge status: Home / Self Care | Payer: Self-pay | Source: Ambulatory Visit | Attending: Interventional Cardiology

## 2011-08-25 ENCOUNTER — Encounter (HOSPITAL_COMMUNITY): Payer: Self-pay | Admitting: Certified Registered"

## 2011-08-25 ENCOUNTER — Ambulatory Visit (HOSPITAL_COMMUNITY)
Admission: RE | Admit: 2011-08-25 | Discharge: 2011-08-25 | Disposition: A | Discharge status: Home / Self Care | Payer: Managed Care, Other (non HMO) | Source: Ambulatory Visit | Attending: Interventional Cardiology | Admitting: Interventional Cardiology

## 2011-08-25 ENCOUNTER — Ambulatory Visit (HOSPITAL_COMMUNITY): Payer: Managed Care, Other (non HMO) | Admitting: Certified Registered"

## 2011-08-25 ENCOUNTER — Encounter (HOSPITAL_COMMUNITY): Payer: Self-pay | Admitting: Gastroenterology

## 2011-08-25 DIAGNOSIS — I10 Essential (primary) hypertension: Secondary | ICD-10-CM | POA: Insufficient documentation

## 2011-08-25 DIAGNOSIS — I4891 Unspecified atrial fibrillation: Secondary | ICD-10-CM | POA: Insufficient documentation

## 2011-08-25 HISTORY — DX: Essential (primary) hypertension: I10

## 2011-08-25 HISTORY — PX: TEE WITHOUT CARDIOVERSION: SHX5443

## 2011-08-25 HISTORY — PX: CARDIOVERSION: SHX1299

## 2011-08-25 HISTORY — DX: Cardiac arrhythmia, unspecified: I49.9

## 2011-08-25 SURGERY — ECHOCARDIOGRAM, TRANSESOPHAGEAL
Anesthesia: Moderate Sedation

## 2011-08-25 MED ORDER — SODIUM CHLORIDE 0.9 % IV SOLN
INTRAVENOUS | Status: DC | PRN
  Administered 2011-08-25: 09:00:00 via INTRAVENOUS

## 2011-08-25 MED ORDER — LIDOCAINE VISCOUS 2 % MT SOLN
OROMUCOSAL | Status: AC
  Filled 2011-08-25: qty 15

## 2011-08-25 MED ORDER — PROPOFOL 10 MG/ML IV EMUL
INTRAVENOUS | Status: DC | PRN
  Administered 2011-08-25 (×2): 60 mg via INTRAVENOUS

## 2011-08-25 MED ORDER — FENTANYL CITRATE 0.05 MG/ML IJ SOLN
INTRAMUSCULAR | Status: AC
  Filled 2011-08-25: qty 2

## 2011-08-25 MED ORDER — CARVEDILOL 12.5 MG PO TABS: 25.0000 mg | ORAL_TABLET | Freq: Two times a day (BID) | ORAL | Status: DC

## 2011-08-25 MED ORDER — LIDOCAINE VISCOUS 2 % MT SOLN
OROMUCOSAL | Status: DC | PRN
  Administered 2011-08-25: 10 mL via OROMUCOSAL

## 2011-08-25 MED ORDER — FENTANYL CITRATE 0.05 MG/ML IJ SOLN
INTRAMUSCULAR | Status: DC | PRN
  Administered 2011-08-25 (×3): 25 ug via INTRAVENOUS

## 2011-08-25 MED ORDER — MIDAZOLAM HCL 10 MG/2ML IJ SOLN
INTRAMUSCULAR | Status: AC
  Filled 2011-08-25: qty 2

## 2011-08-25 MED ORDER — MIDAZOLAM HCL 10 MG/2ML IJ SOLN
INTRAMUSCULAR | Status: DC | PRN
  Administered 2011-08-25: 1 mg via INTRAVENOUS
  Administered 2011-08-25 (×2): 2 mg via INTRAVENOUS

## 2011-08-25 NOTE — Anesthesia Preprocedure Evaluation (Addendum)
Anesthesia Evaluation  Patient identified by MRN, date of birth, ID band Patient awake    Reviewed: Allergy & Precautions, H&P , NPO status , Patient's Chart, lab work & pertinent test results, reviewed documented beta blocker date and time   Airway Mallampati: II TM Distance: >3 FB Neck ROM: Full    Dental  (+) Teeth Intact   Pulmonary          Cardiovascular hypertension, Pt. on medications and Pt. on home beta blockers + dysrhythmias Atrial Fibrillation     Neuro/Psych    GI/Hepatic   Endo/Other    Renal/GU      Musculoskeletal   Abdominal   Peds  Hematology   Anesthesia Other Findings   Reproductive/Obstetrics                           Anesthesia Physical Anesthesia Plan  ASA: III  Anesthesia Plan: General   Post-op Pain Management:    Induction:   Airway Management Planned: Mask  Additional Equipment:   Intra-op Plan:   Post-operative Plan:   Informed Consent: I have reviewed the patients History and Physical, chart, labs and discussed the procedure including the risks, benefits and alternatives for the proposed anesthesia with the patient or authorized representative who has indicated his/her understanding and acceptance.     Plan Discussed with: CRNA and Surgeon  Anesthesia Plan Comments:         Anesthesia Quick Evaluation

## 2011-08-25 NOTE — Discharge Instructions (Addendum)
May restart Multaq 400 mg BID as well.  This medicine helped control the heart rate better.    Atrial Fibrillation  Your caregiver has diagnosed you with atrial fibrillation (AFib). The heart normally beats very regularly; AFib is a type of irregular heartbeat. The heart rate may be faster or slower than normal. This can prevent your heart from pumping as well as it should. AFib can be constant (chronic) or intermittent (paroxysmal). CAUSES  Atrial fibrillation may be caused by:  Heart disease, including heart attack, coronary artery disease, heart failure, diseases of the heart valves, and others.   Blood clot in the lungs (pulmonary embolism).   Pneumonia or other infections.   Chronic lung disease.   Thyroid disease.   Toxins. These include alcohol, some medications (such as decongestant medications or diet pills), and caffeine.  In some people, no cause for AFib can be found. This is referred to as Lone Atrial Fibrillation. SYMPTOMS   Palpitations or a fluttering in your chest.   A vague sense of chest discomfort.   Shortness of breath.   Sudden onset of lightheadedness or weakness.  Sometimes, the first sign of AFib can be a complication of the condition. This could be a stroke or heart failure. DIAGNOSIS  Your description of your condition may make your caregiver suspicious of atrial fibrillation. Your caregiver will examine your pulse to determine if fibrillation is present. An EKG (electrocardiogram) will confirm the diagnosis. Further testing may help determine what caused you to have atrial fibrillation. This may include chest x-ray, echocardiogram, blood tests, or CT scans. PREVENTION  If you have previously had atrial fibrillation, your caregiver may advise you to avoid substances known to cause the condition (such as stimulant medications, and possibly caffeine or alcohol). You may be advised to use medications to prevent recurrence. Proper treatment of any underlying  condition is important to help prevent recurrence. PROGNOSIS  Atrial fibrillation does tend to become a chronic condition over time. It can cause significant complications (see below). Atrial fibrillation is not usually immediately life-threatening, but it can shorten your life expectancy. This seems to be worse in women. If you have lone atrial fibrillation and are under 87 years old, the risk of complications is very low, and life expectancy is not shortened. RISKS AND COMPLICATIONS  Complications of atrial fibrillation can include stroke, chest pain, and heart failure. Your caregiver will recommend treatments for the atrial fibrillation, as well as for any underlying conditions, to help minimize risk of complications. TREATMENT  Treatment for AFib is divided into several categories:  Treatment of any underlying condition.   Converting you out of AFib into a regular (sinus) rhythm.   Controlling rapid heart rate.   Prevention of blood clots and stroke.  Medications and procedures are available to convert your atrial fibrillation to sinus rhythm. However, recent studies have shown that this may not offer you any advantage, and cardiac experts are continuing research and debate on this topic. More important is controlling your rapid heartbeat. The rapid heartbeat causes more symptoms, and places strain on your heart. Your caregiver will advise you on the use of medications that can control your heart rate. Atrial fibrillation is a strong stroke risk. You can lessen this risk by taking blood thinning medications such as Coumadin (warfarin), or sometimes aspirin. These medications need close monitoring by your caregiver. Over-medication can cause bleeding. Too little medication may not protect against stroke. HOME CARE INSTRUCTIONS   If your caregiver prescribed medicine to make  your heartbeat more normally, take as directed.   If blood thinners were prescribed by your caregiver, take EXACTLY as  directed.   Perform blood tests EXACTLY as directed.   Quit smoking. Smoking increases your cardiac and lung (pulmonary) risks.   DO NOT drink alcohol.   DO NOT drink caffeinated drinks (e.g. coffee, soda, chocolate, and leaf teas). You may drink decaffeinated coffee, soda or tea.   If you are overweight, you should choose a reduced calorie diet to lose weight. Please see a registered dietitian if you need more information about healthy weight loss. DO NOT USE DIET PILLS as they may aggravate heart problems.   If you have other heart problems that are causing AFib, you may need to eat a low salt, fat, and cholesterol diet. Your caregiver will tell you if this is necessary.   Exercise every day to improve your physical fitness. Stay active unless advised otherwise.   If your caregiver has given you a follow-up appointment, it is very important to keep that appointment. Not keeping the appointment could result in heart failure or stroke. If there is any problem keeping the appointment, you must call back to this facility for assistance.  SEEK MEDICAL CARE IF:  You notice a change in the rate, rhythm or strength of your heartbeat.   You develop an infection or any other change in your overall health status.  SEEK IMMEDIATE MEDICAL CARE IF:   You develop chest pain, abdominal pain, sweating, weakness or feel sick to your stomach (nausea).   You develop shortness of breath.   You develop swollen feet and ankles.   You develop dizziness, numbness, or weakness of your face or limbs, or any change in vision or speech.  MAKE SURE YOU:   Understand these instructions.   Will watch your condition.   Will get help right away if you are not doing well or get worse.  Document Released: 03/28/2005 Document Revised: 03/17/2011 Document Reviewed: 10/31/2007 [facility] Patient Information 2012 [place], [place]. Endoscopy Care After Please read the instructions outlined below and refer to this  sheet in the next few weeks. These discharge instructions provide you with general information on caring for yourself after you leave the hospital. Your doctor may also give you specific instructions. While your treatment has been planned according to the most current medical practices available, unavoidable complications occasionally occur. If you have any problems or questions after discharge, please call your doctor. HOME CARE INSTRUCTIONS Activity  You may resume your regular activity but move at a slower pace for the next 24 hours.   Take frequent rest periods for the next 24 hours.   Walking will help expel (get rid of) the air and reduce the bloated feeling in your abdomen.   No driving for 24 hours (because of the anesthesia (medicine) used during the test).   You may shower.   Do not sign any important legal documents or operate any machinery for 24 hours (because of the anesthesia used during the test).  Nutrition  Drink plenty of fluids.   You may resume your normal diet.   Begin with a light meal and progress to your normal diet.   Avoid alcoholic beverages for 24 hours or as instructed by your caregiver.  Medications You may resume your normal medications unless your caregiver tells you otherwise. What you can expect today  You may experience abdominal discomfort such as a feeling of fullness or "gas" pains.   You may experience a  sore throat for 2 to 3 days. This is normal. Gargling with salt water may help this.  Follow-up Your doctor will discuss the results of your test with you. SEEK IMMEDIATE MEDICAL CARE IF:  You have excessive nausea (feeling sick to your stomach) and/or vomiting.   You have severe abdominal pain and distention (swelling).   You have trouble swallowing.   You have a temperature over 100 F (37.8 C).   You have rectal bleeding or vomiting of blood.  Document Released: 11/10/2003 Document Revised: 03/17/2011 Document Reviewed:  05/23/2007 [employer] Patient Information 2012 ExitCare, LLCTransesophageal Echocardiography A transesophageal echocardiogram (TEE) is a special type of test that produces images of the heart by sound waves (echocardiogram). This type of echocardiogram can obtain better images of the heart than a standard echocardiogram. A TEE is done by passing a flexible tube down the esophagus. The heart is located in front of the esophagus. Because the heart and esophagus are close to one another, your caregiver can take very clear, detailed pictures of the heart via ultrasound waves. WHY HAVE A TEE? Your caregiver may need more information based on your medical condition. A TEE is usually performed due to the following:  Your caregiver needs more information based on standard echocardiogram findings.   If you had a stroke, this might have happened because a clot formed in your heart. A TEE can visualize different areas of the heart and check for clots.   To check valve anatomy and function. Your caregiver will especially look at the mitral valve.   To check for redness, soreness, and swelling (inflammation) on the inside lining of the heart (endocarditis).   To evaluate the dividing wall (septum) of the heart and presence of a hole that did not close after birth (patent foramen ovale, PFO).   To help diagnose a tear in the wall of the aorta (aortic dissection).   During cardiac valve surgery, a TEE probe is placed. This allows the surgeon to assess the valve repair before closing the chest.  LET YOUR CAREGIVER KNOW ABOUT:   Swallowing difficulties.   An esophageal obstruction.   Use of aspirin or antiplatelet therapy.  RISKS AND COMPLICATIONS  Though extremely rare, an esophageal tear (rupture) is a potential complication. BEFORE THE PROCEDURE   Arrive at least 1 hour before the procedure or as told by your caregiver.   Do not eat or drink for 6 hours before the procedure or as told by your  caregiver.   An intravenous (IV) access tube will be started in the arm.  PROCEDURE   A medicine to help you relax (sedative) will be given through the IV.   A medicine that numbs the area (local anesthetic) may be sprayed to the back of the throat.   Your blood pressure, heart rate, and breathing (vital signs) will be monitored during the procedure.   The TEE probe is a long, flexible tube. It is about the width of an adult male's index finger. The tip of the probe is placed into the back of the mouth and you will be asked to swallow. This helps to pass the tip of the probe into the esophagus. Once the tip of the probe is in the correct area, your caregiver can take pictures of the heart.   A TEE is usually not a painful procedure. You may feel the probe press against the back of the throat. The probe does not enter the trachea and does not affect your breathing.  Your time spent at the hospital is usually less than 2 hours.  AFTER THE PROCEDURE   You will be in bed, resting until you have fully returned to consciousness.   When you first awaken, your throat may feel slightly sore and will probably still feel numb. This will improve slowly over time.   You will not be allowed to eat or drink until it is clear that numbness has improved.   Once you have been able to drink, urinate, and sit on the edge of the bed without feeling sick to your stomach (nauseous) or dizzy, you may be cleared to dress and go home.   Do not drive yourself home. You have had medications that can continue to make you feel drowsy and can impair your reflexes.   You should have a friend or family member with you for the next 24 hours after your examination.  Obtaining the test results It is your responsibility to obtain your test results. Ask the lab or department performing the test when and how you will get your results. SEEK IMMEDIATE MEDICAL CARE IF:   There is chest pain.   You have a hard time  breathing or have shortness of breath.   You cough or throw up (vomit) blood.  MAKE SURE YOU:   Understand these instructions.   Will watch this condition.   Will get help right away if you is not doing well or gets worse.  Document Released: 06/18/2002 Document Revised: 03/17/2011 Document Reviewed: 09/09/2008 [facility] Patient Information 2012 [place], [place] .Marland Kitchen

## 2011-08-25 NOTE — CV Procedure (Signed)
Versed 5 mg; Fentanyl 75 mcg; TEE performed.  No LA/LAA thrombus. Mild MR.  Normal LV function.  Please see [facility] for full report.  Plan for cardioversion.

## 2011-08-25 NOTE — OR Nursing (Addendum)
0921 70 mg IV diprivan Dr. Michelle Piper 0922 120 joules biphasic cardioversion NSR Dr. Eldridge Dace 0922 150 biphasic cardioversion Dr. Eldridge Dace 0923 50 mg IV Diprivan Dr. Michelle Piper 0924 200 Joules biphasic Cardioversion Dr. Eldridge Dace 0924 200 Joules biphasic cardioversion Dr. Eldridge Dace  Patient remains in Atrial fib

## 2011-08-25 NOTE — Transfer of Care (Signed)
Immediate Anesthesia Transfer of Care Note  Patient: [name] [name]  Procedure(s) Performed: Procedure(s) (LRB): TRANSESOPHAGEAL ECHOCARDIOGRAM (TEE) (N/A) CARDIOVERSION (N/A)  Patient Location: Radiology  Anesthesia Type: MAC  Level of Consciousness: awake, alert  and oriented  Airway & Oxygen Therapy: Patient connected to nasal cannula oxygen  Post-op Assessment: Report given to PACU RN, Post -op Vital signs reviewed and stable and Patient moving all extremities X 4  Post vital signs: Reviewed and stable  Complications: No apparent anesthesia complications

## 2011-08-25 NOTE — Anesthesia Postprocedure Evaluation (Signed)
Anesthesia Post Note  Patient: [name] [name]  Procedure(s) Performed: Procedure(s) (LRB): TRANSESOPHAGEAL ECHOCARDIOGRAM (TEE) (N/A) CARDIOVERSION (N/A)  Anesthesia type: general  Patient location: PACU  Post pain: Pain level controlled  Post assessment: Patient's Cardiovascular Status Stable  Last Vitals:  Filed Vitals:   08/25/11 0905  BP: 140/94  Pulse:   Temp:   Resp: 18    Post vital signs: Reviewed and stable  Level of consciousness: sedated  Complications: No apparent anesthesia complications

## 2011-08-25 NOTE — Progress Notes (Signed)
*  PRELIMINARY RESULTS* Echocardiogram TEE has been performed.  Katheren Puller 08/25/2011, 9:24 AM

## 2011-08-25 NOTE — H&P (Signed)
  Date of Initial H&P: 08/18/11  History reviewed, patient examined, no change in status, stable for surgery. 

## 2011-08-25 NOTE — CV Procedure (Signed)
Electrical Cardioversion Procedure Note [name] [name] [id number] [birth year]  Procedure: Electrical Cardioversion Indications:  Atrial Fibrillation  Time Out: Verified patient identification, verified procedure,medications/allergies/relevent history reviewed, required imaging and test results available.  Performed  Procedure Details  The patient was NPO after midnight. Anesthesia was administered at the beside  by [facility] with 120mg  of propofol.  Cardioversion was done with synchronized biphasic defibrillation with AP pads with 120J.  He converted to NSR for a minute but reverted to AFib.  He was then shocked at 150, and then 200 J twice.  He restored NSR briefly but then went back into AFib .  The patient tolerated the procedure well   IMPRESSION:  Unsuccessful cardioversion.  Continue Xarelto.  Will increase rate control meds and consider a different antiarrhythmic.  Will also refer to EP for further evaluation.    [name] [name] S. 08/25/2011, 9:28 AM

## 2011-08-25 NOTE — Anesthesia Postprocedure Evaluation (Signed)
  Anesthesia Post-op Note  Patient: [name] [name]  Procedure(s) Performed: Procedure(s) (LRB): TRANSESOPHAGEAL ECHOCARDIOGRAM (TEE) (N/A) CARDIOVERSION (N/A)  Patient Location: Radiology  Anesthesia Type: MAC  Level of Consciousness: awake, alert  and oriented  Airway and Oxygen Therapy: Patient Spontanous Breathing  Post-op Pain: none  Post-op Assessment: Post-op Vital signs reviewed and Patient's Cardiovascular Status Stable  Post-op Vital Signs: Reviewed and stable  Complications: No apparent anesthesia complications

## 2011-08-26 ENCOUNTER — Encounter (HOSPITAL_COMMUNITY): Payer: Self-pay | Admitting: Interventional Cardiology

## 2011-09-09 ENCOUNTER — Encounter: Payer: Self-pay | Admitting: Internal Medicine

## 2011-10-24 ENCOUNTER — Ambulatory Visit (INDEPENDENT_AMBULATORY_CARE_PROVIDER_SITE_OTHER): Payer: Managed Care, Other (non HMO) | Admitting: Internal Medicine

## 2011-10-24 ENCOUNTER — Encounter: Payer: Self-pay | Admitting: Internal Medicine

## 2011-10-24 VITALS — BP 110/68 | HR 59 | Resp 18 | Ht 68.0 in | Wt 243.1 lb

## 2011-10-24 DIAGNOSIS — R0683 Snoring: Secondary | ICD-10-CM

## 2011-10-24 DIAGNOSIS — E669 Obesity, unspecified: Secondary | ICD-10-CM

## 2011-10-24 DIAGNOSIS — I10 Essential (primary) hypertension: Secondary | ICD-10-CM

## 2011-10-24 DIAGNOSIS — I4891 Unspecified atrial fibrillation: Secondary | ICD-10-CM

## 2011-10-24 DIAGNOSIS — G4733 Obstructive sleep apnea (adult) (pediatric): Secondary | ICD-10-CM | POA: Insufficient documentation

## 2011-10-24 DIAGNOSIS — I152 Hypertension secondary to endocrine disorders: Secondary | ICD-10-CM | POA: Insufficient documentation

## 2011-10-24 DIAGNOSIS — I4892 Unspecified atrial flutter: Secondary | ICD-10-CM

## 2011-10-24 DIAGNOSIS — R0989 Other specified symptoms and signs involving the circulatory and respiratory systems: Secondary | ICD-10-CM

## 2011-10-24 LAB — BASIC METABOLIC PANEL
BUN: 23 mg/dL (ref 6–23)
CO2: 23 mEq/L (ref 19–32)
Chloride: 107 mEq/L (ref 96–112)
Creatinine, Ser: 1.8 mg/dL — ABNORMAL HIGH (ref 0.4–1.5)
Glucose, Bld: 146 mg/dL — ABNORMAL HIGH (ref 70–99)
Potassium: 3.9 mEq/L (ref 3.5–5.1)

## 2011-10-24 MED ORDER — RIVAROXABAN 20 MG PO TABS: 20.0000 mg | ORAL_TABLET | Freq: Every day | ORAL | Status: DC

## 2011-10-24 NOTE — Assessment & Plan Note (Signed)
Weight reduction advised 

## 2011-10-24 NOTE — Assessment & Plan Note (Signed)
As above.

## 2011-10-24 NOTE — Patient Instructions (Addendum)
Your physician has recommended that you have an ablation. Catheter ablation is a medical procedure used to treat some cardiac arrhythmias (irregular heartbeats). During catheter ablation, a long, thin, flexible tube is put into a blood vessel in your groin (upper thigh), or neck. This tube is called an ablation catheter. It is then guided to your heart through the blood vessel. Radio frequency waves destroy small areas of heart tissue where abnormal heartbeats may cause an arrhythmia to start. Please see the instruction sheet given to you today.  Will schedule ablation for 11/10/11 at 7:30am  Be at hospital at 5:30am  TEE on 7/31 Dr Eldridge Dace to call you to schedule  Your physician has recommended you make the following change in your medication: 1) Increase your Xarelto to 20 mg daily

## 2011-10-24 NOTE — Progress Notes (Signed)
Primary Care Physician: Garlan Fillers, MD Referring Physician:  Dr Abran Cantor [name] [name] is a 61 y.o. male with a h/o typical appearing atrial flutter and persistent atrial fibrillation who presents today for EP consultation.  He reports initially being diagnosed with atrial fibrillation January 2010.   He was cardioverted 2/10 to sinus but quickly returned to afib.  He was placed on multaq without improvement.  He was placed on amiodarone and did very well.  He was seen by me 06/2008 at which time he presented in typical appearing atrial flutter.  We had planned to proceed with ablation for atrial flutter and atrial fibrillation.  He ultimately decided to decline ablation and did not follow-up with me.  He continued amiodarone for a while and did well.  Dr Amil Amen decided to stop amiodarone and retry multaq.  He developed recurrent atrial fibrillation.  Over the past 6 months, he has had increasing frequency and duration of atrial fibrillation.  He has tried flecainide without relieve.  Presently, he is anticoagulated with xarelto.  He reports increasing frequency and duration of atrial fibrillation.  He feels that he is in afib most of the time.  During afib, he reports feeling very "washed out".  He has no energy and feels that his "legs weight 150 lbs each".  He has exertional SOB as well as frequent palpitations.  Today, he denies symptoms of chest pain, orthopnea, PND, lower extremity edema, presyncope, syncope, or neurologic sequela.   He has occasional postural dizziness.  The patient is tolerating medications without difficulties and is otherwise without complaint today.   Past Medical History  Diagnosis Date  . Hypertension   . Persistent atrial fibrillation   . Renal calculi   . Chronic systolic CHF (congestive heart failure)     LVEF  previously 35% felt to be due tachycardia  . Diabetes mellitus   . Obesities, morbid   . Hypothyroid   . Gastric ulcer     s/p prior surgery   . Diverticulosis   . Erectile dysfunction   . Obstructive sleep apnea     noncompliant with CPAP   Past Surgical History  Procedure Date  . Wrist sx 1980    right  . Knee sx 1985    both  . Benign stomach tumor removal 2000  . Thyroidectomy 2009    cysts removal   . Laparoscopic retropubic prostatectomy 02/2007    hx prostate cancer  . Kidney stone removal     multiple  . Tee without cardioversion 08/25/2011    Procedure: TRANSESOPHAGEAL ECHOCARDIOGRAM (TEE);  Surgeon: Corky Crafts, MD;  Location: [facility] ENDOSCOPY;  Service: Cardiovascular;  Laterality: N/A;  . Cardioversion 08/25/2011    Procedure: CARDIOVERSION;  Surgeon: Corky Crafts, MD;  Location: [facility] ENDOSCOPY;  Service: Cardiovascular;  Laterality: N/A;    Current Outpatient Prescriptions  Medication Sig Dispense Refill  . carvedilol (COREG) 12.5 MG tablet Take 2 tablets (25 mg total) by mouth 2 (two) times daily with a meal.  60 tablet  11  . cholecalciferol (VITAMIN D) 1000 UNITS tablet Take 1,000 Units by mouth daily.      Marland Kitchen imipramine (TOFRANIL) 25 MG tablet Take 25 mg by mouth at bedtime. 2 tabs at HS      . levothyroxine (SYNTHROID, LEVOTHROID) 200 MCG tablet Take 200 mcg by mouth daily.      . metFORMIN (GLUCOPHAGE) 500 MG tablet Take 500 mg by mouth 2 (two) times daily with a meal.      .  Omega-3 Fatty Acids (FISH OIL) 1000 MG CAPS Take 1,000 capsules by mouth daily.      . pantoprazole (PROTONIX) 40 MG tablet Take 40 mg by mouth daily.      . potassium chloride SA (K-DUR,KLOR-CON) 20 MEQ tablet Take 20 mEq by mouth 2 (two) times daily.      . pravastatin (PRAVACHOL) 20 MG tablet Take 20 mg by mouth daily.      . ramipril (ALTACE) 10 MG capsule Take 10 mg by mouth daily.      . Rivaroxaban (XARELTO) 15 MG TABS tablet Take 15 mg by mouth daily.      Marland Kitchen triamterene-hydrochlorothiazide (MAXZIDE-25) 37.5-25 MG per tablet Take 1 tablet by mouth daily.        Allergies  Allergen Reactions  . Phenergan  (Promethazine Hcl) Itching    Hallucination   . Aspirin     Hx of ulcer   . Bystolic (Nebivolol Hcl)     Bradycardia   . Calcium Channel Blockers     LE edema  . Prednisone     Hx of ulcer  . Nsaids     Hx ulcer    History   Social History  . Marital Status: Married    Spouse Name: N/A    Number of Children: N/A  . Years of Education: N/A   Occupational History  . Not on file.   Social History Main Topics  . Smoking status: Never Smoker   . Smokeless tobacco: Not on file  . Alcohol Use: No  . Drug Use: No  . Sexually Active: Yes   Other Topics Concern  . Not on file   Social History Narrative   Lives in [place].  Works as a Merchandiser, retail at [employer] History  Problem Relation Age of Onset  . Emphysema Mother   . Lung cancer Father   . Diabetes Brother   . Colon cancer Neg Hx     ROS- All systems are reviewed and negative except as per the HPI above  Physical Exam: Filed Vitals:   10/24/11 1149  BP: 110/68  Pulse: 59  Resp: 18  Height: 5\' 8"  (1.727 m)  Weight: 243 lb 1.9 oz (110.279 kg)  SpO2: 98%    GEN- The patient is well appearing, alert and oriented x 3 today.   Head- normocephalic, atraumatic Eyes-  Sclera clear, conjunctiva pink Ears- hearing intact Oropharynx- clear Neck- supple, no JVP Lymph- no cervical lymphadenopathy Lungs- Clear to ausculation bilaterally, normal work of breathing Heart- Regular rate and rhythm, no murmurs, rubs or gallops, PMI not laterally displaced GI- soft, NT, ND, + BS Extremities- no clubbing, cyanosis, or edema MS- no significant deformity or atrophy Skin- no rash or lesion Psych- euthymic mood, full affect Neuro- strength and sensation are intact  EKG today reveals sinus bradycardia 53 bpm, PR 174, QRS 102, Qtc 414 Echo 09/09/11- EF 60-65%, LA size 44mm, trace MR, mild dilatation of the aortic root Myoview 06/24/08- reviewed  Assessment and Plan:

## 2011-10-24 NOTE — Assessment & Plan Note (Signed)
Stable No change required today  

## 2011-10-24 NOTE — Assessment & Plan Note (Signed)
The patient has symptomatic persistent atrial fibrillation and typical appearing atrial flutter.  He has failed medical therapy with multaq, flecainide, and amiodarone. Therapeutic strategies for afib and atrial flutter including medicine and ablation were discussed in detail with the patient today. Risk, benefits, and alternatives to EP study and radiofrequency ablation were also discussed in detail today. These risks include but are not limited to stroke, bleeding, vascular damage, tamponade, perforation, damage to the esophagus, lungs, and other structures, pulmonary vein stenosis, worsening renal function, and death. The patient understands these risk and wishes to proceed.  We will therefore proceed with catheter ablation at the next available time.  He will require a TEE on the day prior to ablation.  We will ask [facility] cardiologist to assist with this. Recent CrCl was >50.  I will therefore increase xarelto to 20mg  daliy today.

## 2011-10-24 NOTE — Assessment & Plan Note (Signed)
Compliance with CPAP encouraged  He may benefit from repeat sleep study.  I will defer to Dr Eldridge Dace

## 2011-10-25 ENCOUNTER — Telehealth: Payer: Self-pay | Admitting: Internal Medicine

## 2011-10-25 ENCOUNTER — Other Ambulatory Visit: Payer: Self-pay | Admitting: Internal Medicine

## 2011-10-25 DIAGNOSIS — I4891 Unspecified atrial fibrillation: Secondary | ICD-10-CM

## 2011-10-25 MED ORDER — RIVAROXABAN 20 MG PO TABS: 20.0000 mg | ORAL_TABLET | Freq: Every day | ORAL | Status: DC

## 2011-10-25 NOTE — Telephone Encounter (Signed)
New msg Pt's wife wants to ask some questions about when he is to go back to work

## 2011-10-26 ENCOUNTER — Telehealth: Payer: Self-pay | Admitting: Internal Medicine

## 2011-10-26 ENCOUNTER — Encounter: Payer: Self-pay | Admitting: Internal Medicine

## 2011-10-26 NOTE — Telephone Encounter (Signed)
patient having afib ablation on 11/10/11

## 2011-10-26 NOTE — Telephone Encounter (Signed)
Please return call to patient wife Kendal Hymen [phone] who has questions about patient upcoming surgery

## 2011-10-27 ENCOUNTER — Other Ambulatory Visit: Payer: Self-pay | Admitting: *Deleted

## 2011-10-27 ENCOUNTER — Encounter: Payer: Self-pay | Admitting: *Deleted

## 2011-10-27 ENCOUNTER — Telehealth: Payer: Self-pay | Admitting: *Deleted

## 2011-10-27 DIAGNOSIS — I4891 Unspecified atrial fibrillation: Secondary | ICD-10-CM

## 2011-10-27 NOTE — Telephone Encounter (Signed)
Instructions mailed to patient.

## 2011-10-27 NOTE — Telephone Encounter (Signed)
needs to D/C Maxzide and get BMP in one week

## 2011-10-27 NOTE — Telephone Encounter (Signed)
Spoke with wife and instructions for ablation mailed to patient

## 2011-10-28 ENCOUNTER — Telehealth: Payer: Self-pay | Admitting: Internal Medicine

## 2011-10-28 NOTE — Telephone Encounter (Signed)
°  F/u  Patient wife calling for f/u as she was disconnected in error.  Plz return call regarding patient care at hm#

## 2011-10-28 NOTE — Telephone Encounter (Signed)
Pt advised to D/c Maxzide and have BMET redrawn on 7/25.

## 2011-10-28 NOTE — Telephone Encounter (Signed)
Pt rtn kelly call from yesterday re a change in pt's meds, pls call

## 2011-11-03 ENCOUNTER — Other Ambulatory Visit (INDEPENDENT_AMBULATORY_CARE_PROVIDER_SITE_OTHER): Payer: Managed Care, Other (non HMO)

## 2011-11-03 ENCOUNTER — Encounter (HOSPITAL_COMMUNITY): Payer: Self-pay | Admitting: Pharmacy Technician

## 2011-11-03 DIAGNOSIS — I4891 Unspecified atrial fibrillation: Secondary | ICD-10-CM

## 2011-11-03 LAB — BASIC METABOLIC PANEL
BUN: 20 mg/dL (ref 6–23)
Calcium: 9.1 mg/dL (ref 8.4–10.5)
GFR: 47.91 mL/min — ABNORMAL LOW (ref >60.00)
Glucose, Bld: 99 mg/dL (ref 70–99)
Sodium: 137 mEq/L (ref 135–145)

## 2011-11-03 LAB — CBC WITH DIFFERENTIAL/PLATELET
Eosinophils Absolute: 0.1 10*3/uL (ref 0.0–0.7)
Eosinophils Relative: 2 % (ref 0.0–5.0)
HCT: 38.1 % — ABNORMAL LOW (ref 39.0–52.0)
Lymphs Abs: 2.3 10*3/uL (ref 0.7–4.0)
MCHC: 33.7 g/dL (ref 30.0–36.0)
MCV: 91.7 fl (ref 78.0–100.0)
Monocytes Absolute: 0.6 10*3/uL (ref 0.1–1.0)
Neutrophils Relative %: 58.1 % (ref 43.0–77.0)
Platelets: 179 10*3/uL (ref 150.0–400.0)
WBC: 7.4 10*3/uL (ref 4.5–10.5)

## 2011-11-04 ENCOUNTER — Other Ambulatory Visit: Payer: Self-pay | Admitting: Interventional Cardiology

## 2011-11-08 ENCOUNTER — Telehealth: Payer: Self-pay | Admitting: Internal Medicine

## 2011-11-08 NOTE — Telephone Encounter (Signed)
New msg Pt's wife called about ablation scheduled for thursday

## 2011-11-08 NOTE — Telephone Encounter (Signed)
Pt's wife states that they never r'cvd the letter regarding the ablation. I let her know to be at short stay at 5:30 for a 7:30 procedure, for him not to eat or drink after midnight and to hold Metformin the morning of his procedure. Told them to be prepared for an overnight stay just in case and to have insurance cards and med list available.

## 2011-11-09 ENCOUNTER — Encounter (HOSPITAL_COMMUNITY)
Admission: RE | Disposition: A | Discharge status: Home / Self Care | Payer: Self-pay | Source: Ambulatory Visit | Attending: Interventional Cardiology

## 2011-11-09 ENCOUNTER — Encounter (HOSPITAL_COMMUNITY): Payer: Self-pay

## 2011-11-09 ENCOUNTER — Ambulatory Visit (HOSPITAL_COMMUNITY)
Admission: RE | Admit: 2011-11-09 | Discharge: 2011-11-09 | Disposition: A | Discharge status: Home / Self Care | Payer: Managed Care, Other (non HMO) | Source: Ambulatory Visit | Attending: Interventional Cardiology | Admitting: Interventional Cardiology

## 2011-11-09 DIAGNOSIS — I059 Rheumatic mitral valve disease, unspecified: Secondary | ICD-10-CM | POA: Insufficient documentation

## 2011-11-09 HISTORY — PX: TEE WITHOUT CARDIOVERSION: SHX5443

## 2011-11-09 SURGERY — ECHOCARDIOGRAM, TRANSESOPHAGEAL
Anesthesia: Moderate Sedation

## 2011-11-09 MED ORDER — SODIUM CHLORIDE 0.45 % IV SOLN: INTRAVENOUS | Status: DC

## 2011-11-09 MED ORDER — MIDAZOLAM HCL 10 MG/2ML IJ SOLN: 10.0000 mg | Freq: Once | INTRAMUSCULAR | Status: DC

## 2011-11-09 MED ORDER — LIDOCAINE VISCOUS 2 % MT SOLN
OROMUCOSAL | Status: DC | PRN
  Administered 2011-11-09: 20 mL via OROMUCOSAL

## 2011-11-09 MED ORDER — MIDAZOLAM HCL 10 MG/2ML IJ SOLN
INTRAMUSCULAR | Status: AC
  Filled 2011-11-09: qty 2

## 2011-11-09 MED ORDER — SODIUM CHLORIDE 0.9 % IJ SOLN: 3.0000 mL | Freq: Two times a day (BID) | INTRAMUSCULAR | Status: DC

## 2011-11-09 MED ORDER — LIDOCAINE VISCOUS 2 % MT SOLN
OROMUCOSAL | Status: AC
  Filled 2011-11-09: qty 15

## 2011-11-09 MED ORDER — SODIUM CHLORIDE 0.9 % IV SOLN
250.0000 mL | INTRAVENOUS | Status: DC | PRN
  Administered 2011-11-09: 500 mL via INTRAVENOUS

## 2011-11-09 MED ORDER — SODIUM CHLORIDE 0.9 % IJ SOLN: 3.0000 mL | INTRAMUSCULAR | Status: DC | PRN

## 2011-11-09 MED ORDER — BENZOCAINE 20 % MT SOLN: 1.0000 "application " | OROMUCOSAL | Status: DC | PRN

## 2011-11-09 MED ORDER — MIDAZOLAM HCL 10 MG/2ML IJ SOLN
INTRAMUSCULAR | Status: DC | PRN
  Administered 2011-11-09 (×2): 1 mg via INTRAVENOUS
  Administered 2011-11-09: 2 mg via INTRAVENOUS

## 2011-11-09 MED ORDER — FENTANYL CITRATE 0.05 MG/ML IJ SOLN
INTRAMUSCULAR | Status: DC | PRN
  Administered 2011-11-09 (×2): 25 ug via INTRAVENOUS

## 2011-11-09 MED ORDER — FENTANYL CITRATE 0.05 MG/ML IJ SOLN
INTRAMUSCULAR | Status: AC
  Filled 2011-11-09: qty 2

## 2011-11-09 NOTE — CV Procedure (Signed)
TEE completed.  No complications.  Normal LV function.  Mild mitral regurgitation.  No LA/LAA thrombus.  Normal aortic valve.  No AI.  No aortic atherosclerosis.  See [employer] for full report.

## 2011-11-09 NOTE — Progress Notes (Signed)
  Echocardiogram Echocardiogram Transesophageal has been performed.  [name] [name] 11/09/2011, 9:14 AM

## 2011-11-09 NOTE — H&P (Signed)
  Date of Initial H&P: 10/24/11  History reviewed, patient examined, no change in status, stable for TEE.  AFib ablation planned for tomorrow if no thrombus.

## 2011-11-10 ENCOUNTER — Telehealth: Payer: Self-pay | Admitting: Internal Medicine

## 2011-11-10 ENCOUNTER — Ambulatory Visit (HOSPITAL_COMMUNITY)
Admission: RE | Admit: 2011-11-10 | Payer: Managed Care, Other (non HMO) | Source: Ambulatory Visit | Admitting: Internal Medicine

## 2011-11-10 ENCOUNTER — Encounter (HOSPITAL_COMMUNITY): Payer: Self-pay | Admitting: Interventional Cardiology

## 2011-11-10 SURGERY — ATRIAL FIBRILLATION ABLATION
Anesthesia: Monitor Anesthesia Care

## 2011-11-10 NOTE — Telephone Encounter (Signed)
lmom for patient that I will get him scheduled for 12/06/11 with Dr Johney Frame and call him

## 2011-11-10 NOTE — Telephone Encounter (Signed)
New msg Pt's wife called to say that the earlier day would be better but call to discuss when ablation will be rescheduled.

## 2011-11-16 ENCOUNTER — Encounter: Payer: Self-pay | Admitting: *Deleted

## 2011-11-22 ENCOUNTER — Encounter (HOSPITAL_COMMUNITY): Payer: Self-pay | Admitting: Pharmacy Technician

## 2011-12-02 ENCOUNTER — Telehealth: Payer: Self-pay | Admitting: Internal Medicine

## 2011-12-02 NOTE — Telephone Encounter (Signed)
Need to follow up with Dr Eldridge Dace in regards to swelling if any and we will be glad to help with paper work for [facility].  Also she has been charged for the last TEE and was not supposed to be.  We will have this corrected and I have reassured her Dr Johney Frame is aware

## 2011-12-02 NOTE — Telephone Encounter (Signed)
New msg Pt's wife called about maxzide. He had some swelling in ankles. Please call

## 2011-12-05 ENCOUNTER — Telehealth: Payer: Self-pay | Admitting: Internal Medicine

## 2011-12-05 NOTE — Telephone Encounter (Signed)
Pt rtn call, pls call [phone]

## 2011-12-05 NOTE — Telephone Encounter (Signed)
Left message to call back  

## 2011-12-05 NOTE — Telephone Encounter (Signed)
Explained to Ms Millan that Dr Johney Frame now holds all meds before procedures was the change.

## 2011-12-05 NOTE — Telephone Encounter (Signed)
Pt has surgery 8-27 and received two letters to stop all meds and one to stop metformin and linsinopirl, pls call [phone]

## 2011-12-06 ENCOUNTER — Ambulatory Visit (HOSPITAL_COMMUNITY)
Admission: RE | Admit: 2011-12-06 | Discharge: 2011-12-07 | Disposition: A | Discharge status: Home / Self Care | Payer: Managed Care, Other (non HMO) | Source: Ambulatory Visit | Attending: Internal Medicine | Admitting: Internal Medicine

## 2011-12-06 ENCOUNTER — Encounter (HOSPITAL_COMMUNITY): Payer: Self-pay | Admitting: *Deleted

## 2011-12-06 ENCOUNTER — Ambulatory Visit (HOSPITAL_COMMUNITY): Payer: Managed Care, Other (non HMO) | Admitting: Anesthesiology

## 2011-12-06 ENCOUNTER — Encounter (HOSPITAL_COMMUNITY): Payer: Self-pay | Admitting: Anesthesiology

## 2011-12-06 ENCOUNTER — Other Ambulatory Visit: Payer: Self-pay

## 2011-12-06 ENCOUNTER — Encounter (HOSPITAL_COMMUNITY)
Admission: RE | Disposition: A | Discharge status: Home / Self Care | Payer: Self-pay | Source: Ambulatory Visit | Attending: Internal Medicine

## 2011-12-06 DIAGNOSIS — I4892 Unspecified atrial flutter: Secondary | ICD-10-CM | POA: Insufficient documentation

## 2011-12-06 DIAGNOSIS — I4891 Unspecified atrial fibrillation: Secondary | ICD-10-CM

## 2011-12-06 DIAGNOSIS — I152 Hypertension secondary to endocrine disorders: Secondary | ICD-10-CM | POA: Diagnosis present

## 2011-12-06 DIAGNOSIS — E039 Hypothyroidism, unspecified: Secondary | ICD-10-CM | POA: Insufficient documentation

## 2011-12-06 DIAGNOSIS — E119 Type 2 diabetes mellitus without complications: Secondary | ICD-10-CM | POA: Insufficient documentation

## 2011-12-06 DIAGNOSIS — E669 Obesity, unspecified: Secondary | ICD-10-CM | POA: Diagnosis present

## 2011-12-06 DIAGNOSIS — I509 Heart failure, unspecified: Secondary | ICD-10-CM | POA: Insufficient documentation

## 2011-12-06 DIAGNOSIS — I5022 Chronic systolic (congestive) heart failure: Secondary | ICD-10-CM | POA: Insufficient documentation

## 2011-12-06 DIAGNOSIS — G4733 Obstructive sleep apnea (adult) (pediatric): Secondary | ICD-10-CM | POA: Diagnosis present

## 2011-12-06 DIAGNOSIS — I10 Essential (primary) hypertension: Secondary | ICD-10-CM | POA: Diagnosis present

## 2011-12-06 HISTORY — PX: ATRIAL FIBRILLATION ABLATION: SHX5456

## 2011-12-06 LAB — BASIC METABOLIC PANEL
CO2: 22 mEq/L (ref 19–32)
Chloride: 109 mEq/L (ref 96–112)
Creatinine, Ser: 1.4 mg/dL — ABNORMAL HIGH (ref 0.50–1.35)

## 2011-12-06 LAB — MRSA PCR SCREENING: MRSA by PCR: NEGATIVE

## 2011-12-06 LAB — CBC
HCT: 38.4 % — ABNORMAL LOW (ref 39.0–52.0)
MCV: 88.7 fL (ref 78.0–100.0)
Platelets: 215 10*3/uL (ref 150–400)
RBC: 4.33 MIL/uL (ref 4.22–5.81)
WBC: 6.5 10*3/uL (ref 4.0–10.5)

## 2011-12-06 LAB — PROTIME-INR: Prothrombin Time: 22.1 seconds — ABNORMAL HIGH (ref 11.6–15.2)

## 2011-12-06 LAB — POCT ACTIVATED CLOTTING TIME
Activated Clotting Time: 224 seconds
Activated Clotting Time: 184 seconds
Activated Clotting Time: 179 seconds

## 2011-12-06 LAB — GLUCOSE, CAPILLARY: Glucose-Capillary: 101 mg/dL — ABNORMAL HIGH (ref 70–99)

## 2011-12-06 SURGERY — ATRIAL FIBRILLATION ABLATION
Anesthesia: Monitor Anesthesia Care

## 2011-12-06 MED ORDER — POTASSIUM CHLORIDE CRYS ER 20 MEQ PO TBCR
20.0000 meq | EXTENDED_RELEASE_TABLET | Freq: Two times a day (BID) | ORAL | Status: DC
  Filled 2011-12-06 (×2): qty 2

## 2011-12-06 MED ORDER — SODIUM CHLORIDE 0.9 % IJ SOLN: 3.0000 mL | INTRAMUSCULAR | Status: DC | PRN

## 2011-12-06 MED ORDER — HEPARIN SODIUM (PORCINE) 1000 UNIT/ML IJ SOLN
INTRAMUSCULAR | Status: AC
  Filled 2011-12-06: qty 1

## 2011-12-06 MED ORDER — SUFENTANIL CITRATE 50 MCG/ML IV SOLN
INTRAVENOUS | Status: DC | PRN
  Administered 2011-12-06: 5 ug via INTRAVENOUS
  Administered 2011-12-06: 10 ug via INTRAVENOUS
  Administered 2011-12-06: 5 ug via INTRAVENOUS

## 2011-12-06 MED ORDER — LEVOTHYROXINE SODIUM 200 MCG PO TABS
200.0000 ug | ORAL_TABLET | Freq: Every day | ORAL | Status: DC
  Filled 2011-12-06 (×2): qty 1

## 2011-12-06 MED ORDER — GLYCOPYRROLATE 0.2 MG/ML IJ SOLN
INTRAMUSCULAR | Status: DC | PRN
  Administered 2011-12-06: 0.4 mg via INTRAVENOUS

## 2011-12-06 MED ORDER — ONDANSETRON HCL 4 MG/2ML IJ SOLN: 4.0000 mg | Freq: Four times a day (QID) | INTRAMUSCULAR | Status: DC | PRN

## 2011-12-06 MED ORDER — MIDAZOLAM HCL 2 MG/2ML IJ SOLN: 0.5000 mg | Freq: Once | INTRAMUSCULAR | Status: AC | PRN

## 2011-12-06 MED ORDER — HYDROCODONE-ACETAMINOPHEN 5-325 MG PO TABS
ORAL_TABLET | ORAL | Status: AC
  Filled 2011-12-06: qty 2

## 2011-12-06 MED ORDER — HYDROCODONE-ACETAMINOPHEN 5-325 MG PO TABS
1.0000 | ORAL_TABLET | ORAL | Status: DC | PRN
  Administered 2011-12-07 (×2): 1 via ORAL
  Administered 2011-12-07: 2 via ORAL
  Filled 2011-12-06 (×2): qty 2
  Filled 2011-12-06 (×2): qty 1

## 2011-12-06 MED ORDER — EPHEDRINE SULFATE 50 MG/ML IJ SOLN
INTRAMUSCULAR | Status: DC | PRN
  Administered 2011-12-06: 5 mg via INTRAVENOUS
  Administered 2011-12-06: 10 mg via INTRAVENOUS
  Administered 2011-12-06: 5 mg via INTRAVENOUS

## 2011-12-06 MED ORDER — MIDAZOLAM HCL 5 MG/5ML IJ SOLN
INTRAMUSCULAR | Status: DC | PRN
  Administered 2011-12-06 (×2): 1 mg via INTRAVENOUS

## 2011-12-06 MED ORDER — BUPIVACAINE HCL (PF) 0.25 % IJ SOLN
INTRAMUSCULAR | Status: AC
  Filled 2011-12-06: qty 30

## 2011-12-06 MED ORDER — LIDOCAINE HCL (CARDIAC) 20 MG/ML IV SOLN
INTRAVENOUS | Status: DC | PRN
  Administered 2011-12-06: 20 mg via INTRAVENOUS

## 2011-12-06 MED ORDER — NEOSTIGMINE METHYLSULFATE 1 MG/ML IJ SOLN
INTRAMUSCULAR | Status: DC | PRN
  Administered 2011-12-06: 3 mg via INTRAVENOUS

## 2011-12-06 MED ORDER — PANTOPRAZOLE SODIUM 40 MG PO TBEC: 40.0000 mg | DELAYED_RELEASE_TABLET | Freq: Every day | ORAL | Status: DC

## 2011-12-06 MED ORDER — ROCURONIUM BROMIDE 100 MG/10ML IV SOLN
INTRAVENOUS | Status: DC | PRN
  Administered 2011-12-06: 50 mg via INTRAVENOUS

## 2011-12-06 MED ORDER — FENTANYL CITRATE 0.05 MG/ML IJ SOLN
25.0000 ug | INTRAMUSCULAR | Status: DC | PRN
  Administered 2011-12-06 (×2): 50 ug via INTRAVENOUS

## 2011-12-06 MED ORDER — SODIUM CHLORIDE 0.9 % IJ SOLN: 3.0000 mL | Freq: Two times a day (BID) | INTRAMUSCULAR | Status: DC

## 2011-12-06 MED ORDER — ACETAMINOPHEN 325 MG PO TABS: 650.0000 mg | ORAL_TABLET | ORAL | Status: DC | PRN

## 2011-12-06 MED ORDER — HEPARIN SODIUM (PORCINE) 1000 UNIT/ML IJ SOLN
INTRAMUSCULAR | Status: DC | PRN
  Administered 2011-12-06: 8000 [IU] via INTRAVENOUS
  Administered 2011-12-06: 5000 [IU] via INTRAVENOUS

## 2011-12-06 MED ORDER — PROPOFOL 10 MG/ML IV EMUL
INTRAVENOUS | Status: DC | PRN
  Administered 2011-12-06: 200 mg via INTRAVENOUS
  Administered 2011-12-06: 40 mg via INTRAVENOUS
  Administered 2011-12-06: 50 mg via INTRAVENOUS

## 2011-12-06 MED ORDER — IMIPRAMINE HCL 50 MG PO TABS
50.0000 mg | ORAL_TABLET | Freq: Every day | ORAL | Status: DC
  Administered 2011-12-06: 50 mg via ORAL
  Filled 2011-12-06 (×2): qty 1

## 2011-12-06 MED ORDER — FENTANYL CITRATE 0.05 MG/ML IJ SOLN
INTRAMUSCULAR | Status: AC
  Filled 2011-12-06: qty 2

## 2011-12-06 MED ORDER — PROTAMINE SULFATE 10 MG/ML IV SOLN
INTRAVENOUS | Status: DC | PRN
  Administered 2011-12-06 (×4): 10 mg via INTRAVENOUS

## 2011-12-06 MED ORDER — MEPERIDINE HCL 25 MG/ML IJ SOLN: 6.2500 mg | INTRAMUSCULAR | Status: DC | PRN

## 2011-12-06 MED ORDER — ONDANSETRON HCL 4 MG/2ML IJ SOLN
INTRAMUSCULAR | Status: DC | PRN
  Administered 2011-12-06: 4 mg via INTRAVENOUS

## 2011-12-06 MED ORDER — SODIUM CHLORIDE 0.9 % IV SOLN: 250.0000 mL | INTRAVENOUS | Status: DC | PRN

## 2011-12-06 MED ORDER — RIVAROXABAN 20 MG PO TABS
20.0000 mg | ORAL_TABLET | Freq: Every day | ORAL | Status: DC
  Administered 2011-12-07: 20 mg via ORAL
  Filled 2011-12-06 (×2): qty 1

## 2011-12-06 MED ORDER — LACTATED RINGERS IV SOLN
INTRAVENOUS | Status: DC | PRN
  Administered 2011-12-06 (×3): via INTRAVENOUS

## 2011-12-06 NOTE — Anesthesia Preprocedure Evaluation (Signed)
Anesthesia Evaluation  Patient identified by MRN, date of birth, ID band Patient awake    Reviewed: Allergy & Precautions, H&P , NPO status , Patient's Chart, lab work & pertinent test results, reviewed documented beta blocker date and time   History of Anesthesia Complications Negative for: history of anesthetic complications  Airway Mallampati: II TM Distance: >3 FB Neck ROM: Full    Dental No notable dental hx. (+) Teeth Intact and Dental Advisory Given   Pulmonary sleep apnea (no longer needs CPAP) ,  breath sounds clear to auscultation  Pulmonary exam normal       Cardiovascular hypertension, Pt. on medications and Pt. on home beta blockers - CHF + dysrhythmias (ECHO 5/13: EF 55-60%, mild MR) Atrial Fibrillation Rhythm:Irregular Rate:Normal     Neuro/Psych PSYCHIATRIC DISORDERS negative neurological ROS     GI/Hepatic Neg liver ROS, GERD-  Medicated and Controlled,  Endo/Other  Well Controlled, Type obesity  Renal/GU Renal diseasenegative Renal ROS     Musculoskeletal   Abdominal (+) + obese,   Peds  Hematology INR 1.9   Anesthesia Other Findings   Reproductive/Obstetrics                           Anesthesia Physical Anesthesia Plan  ASA: III  Anesthesia Plan: General   Post-op Pain Management:    Induction: Intravenous  Airway Management Planned: LMA  Additional Equipment:   Intra-op Plan:   Post-operative Plan:   Informed Consent: I have reviewed the patients History and Physical, chart, labs and discussed the procedure including the risks, benefits and alternatives for the proposed anesthesia with the patient or authorized representative who has indicated his/her understanding and acceptance.   Dental advisory given  Plan Discussed with: CRNA and Surgeon  Anesthesia Plan Comments: (Plan routine monitors, GA- LMA OK)        Anesthesia Quick Evaluation

## 2011-12-06 NOTE — Anesthesia Postprocedure Evaluation (Signed)
Anesthesia Post Note  Patient: [name] [name]  Procedure(s) Performed: Procedure(s) (LRB): ATRIAL FIBRILLATION ABLATION (N/A)  Anesthesia type: general  Patient location: PACU  Post pain: Pain level controlled  Post assessment: Patient's Cardiovascular Status Stable  Last Vitals:  Filed Vitals:   12/06/11 0840  BP: 126/80  Pulse: 59  Temp: 36.4 C  Resp: 18    Post vital signs: Reviewed and stable  Level of consciousness: sedated  Complications: No apparent anesthesia complications

## 2011-12-06 NOTE — Op Note (Signed)
SURGEON:  Hillis Range, MD  PREPROCEDURE DIAGNOSES: 1. Paroxysmal atrial fibrillation. 2. Typical appearing atrial flutter  POSTPROCEDURE DIAGNOSES: 1. Paroxysmal  atrial fibrillation. 2. Typical appearing atrial flutter  PROCEDURES: 1. Comprehensive electrophysiologic study. 2. Coronary sinus pacing and recording. 3. Three-dimensional mapping of atrial fibrillation with additional mapping of a second discrete focus (right atrial flutter isthmus) 4. Ablation of  atrial fibrillation with additional ablatioin of a second discrete focus (right atrial flutter isthmus) 5. Arterial blood pressure monitoring. 6. Intracardiac echocardiography. 7. Transseptal puncture of an intact septum. 8. Rotational Angiography with processing at an independent workstation  INTRODUCTION:  [name] [name] is a 61 y.o. male with a history of paroxysmal atrial fibrillation and typical appearing atrial flutter who now presents for EP study and radiofrequency ablation.  The patient reports initially being diagnosed with atrial fibrillation after presenting with symptomatic palpitations and fatgiue. The patient reports increasing frequency and duration of atrial arrhythmias since that time.  The patient has failed medical therapy.  The patient therefore presents today for catheter ablation of atrial fibrillation and atrial flutter.  DESCRIPTION OF PROCEDURE:  Informed written consent was obtained, and the patient was brought to the electrophysiology lab in a fasting state.  The patient was adequately sedated with intravenous medications as outlined in the anesthesia report.  The patient's left and right groins were prepped and draped in the usual sterile fashion by the EP lab staff.  Using a percutaneous Seldinger technique, two 7-French and one 11 French hemostasis sheaths were placed into the right common femoral vein.  A 4- [place] hemostasis sheath was placed in the right common femoral artery for blood pressure  monitoring.    3 Dimensional Rotational Angiography: A 5 french pigtail catheter was introduced through the right common femoral vein and advanced into the inferior venocava.  3 demential rotational angiography was then performed by power injection of 100cc of nonionic contrast.  Reprocessing at an independent work station was then performed.   This demonstrated a moderate sized left atrium with 4 separate pulmonary veins which were also moderate in size.  There were no anomalous veins or significant abnormalities.  A 3 dimensional rendering of the left atrium was then merged using [employer] onto the WellPoint system and registered with intracardiac echo (see below).  The pigtail catheter was then removed.  Catheter Placement:  A 7-French Biosense Webster Decapolar coronary sinus catheter was introduced through the right common femoral vein and advanced into the coronary sinus for recording and pacing from this location.  A 6-French quadripolar Josephson catheter was introduced through the right common femoral vein and advanced into the right ventricle for recording and pacing.  This catheter was then pulled back to the His bundle location.    Initial Measurements: The patient presented to the hospital today in sinus rhythm.  Upon arrival to the electrophysiology lab, he had converted to atrial fibrillation.  By the time groin access had been accomplished, he was back in sinus rhythm.  His PR interval measured 145 with a QRS duringation of 108 and a QT interval of 416 msec.  The AH interval measured and the HV interval measured 42 msec.     Intracardiac Echocardiography: A 10-French Biosense Webster AcuNav intracardiac echocardiography catheter was introduced through the left common femoral vein and advanced into the right atrium. Intracardiac echocardiography was performed of the left atrium, and a three-dimensional anatomical rendering of the left atrium was performed using  CARTO sound technology.  The  patient was noted to have a moderate sized left atrium.  The interatrial septum was mildly aneurysmal. All 4 pulmonary veins were visualized and noted to have separate ostia.  The pulmonary veins were moderate in size.  The left atrial appendage was visualized and did not reveal thrombus.   There was no evidence of pulmonary vein stenosis.   Transseptal Puncture: The middle right common femoral vein sheath was exchanged for an 8.5 [place] SL2 transseptal sheath and transseptal access was achieved in a standard fashion using a Brockenbrough needle under biplane fluoroscopy with intracardiac echocardiography confirmation of the transseptal puncture.  Once transseptal access had been achieved, heparin was administered intravenously and intra- arterially in order to maintain an ACT of greater than 300 seconds throughout the procedure.   3D Mapping and Ablation: The His bundle catheter was removed and in its place a 3.5 mm Biosense Webster EZ [employer] ablation catheter was advanced into the right atrium.  The transseptal sheath was pulled back into the IVC over a guidewire.  The ablation catheter was advanced across the transseptal hole using the wire as a guide.  The transseptal sheath was then re-advanced over the guidewire into the left atrium.  A duodecapolar Biosense Webster circular mapping catheter was introduced through the transseptal sheath and positioned over the mouth of all 4 pulmonary veins.  Three-dimensional electroanatomical mapping was performed using CARTO technology.  This demonstrated prodigious electrical activity within all four pulmonary veins at baseline. The patient underwent successful sequential electrical isolation and anatomical encircling of all four pulmonary veins using radiofrequency current with a circular mapping catheter as a guide.   Additional Ablation of a second discrete focus (right atrial flutter isthmus) The ablation catheter was  then pulled back into the right atrial and positioned along the cavo-tricuspid isthmus.  Mapping along the atrial side of the isthmus was performed.  This demonstrated a standard isthmus.  A series of radiofrequency applications were then delivered along the isthmus.  Complete bidirectional cavotricuspid isthmus block was achieved as confirmed by differential atrial pacing from the low lateral right atrium.  A stimulus to earliest atrial activation across the isthmus measured 150 msec bi-directionally.  The patient was observe without return of conduction through the isthmus.  Measurements Following Ablation: Following ablation the AH interval measured with an HV interval of 43 msec. Ventricular pacing was performed, which revealed midline decremental VA conduction with a VA Wenckebach cycle length of 530 msec.  Rapid atrial pacing was performed, which revealed an AV Wenckebach cycle length of 370 msec. PR was less then RR.  No arrhythmias were induced with atrial pacing down to a cycle length of . Electroisolation was then again confirmed in all four pulmonary veins.  The procedure was therefore considered completed.  All catheters were removed, and the sheaths were aspirated and flushed.  The patient was transferred to the recovery area for sheath removal per protocol.  A limited bedside transthoracic echocardiogram revealed no pericardial effusion.  There were no early apparent complications.  CONCLUSIONS: 1. Sinus rhythm upon presentation.   2. Rotational Angiography reveals a moderate sized left atrium with four separate pulmonary veins without evidence of pulmonary vein stenosis. 3. Successful electrical isolation and anatomical encircling of all four pulmonary veins with radiofrequency current. 4. Cavo-tricuspid isthmus ablation was performed with complete bidirectional isthmus block achieved.  5. No inducible arrhythmias following ablation 6. No early apparent  complications.   Dashanae Longfield,MD 3:03 PM 12/06/2011

## 2011-12-06 NOTE — Anesthesia Procedure Notes (Signed)
Procedure Name: Intubation Date/Time: 12/06/2011 11:51 AM Performed by: Marni Griffon Pre-anesthesia Checklist: Patient identified, Emergency Drugs available, Suction available and Patient being monitored Patient Re-evaluated:Patient Re-evaluated prior to inductionOxygen Delivery Method: Circle system utilized Preoxygenation: Pre-oxygenation with 100% oxygen Intubation Type: IV induction Ventilation: Mask ventilation without difficulty Laryngoscope Size: Mac and 4 Grade View: Grade II Tube type: Oral Tube size: 7.5 mm Number of attempts: 1 Airway Equipment and Method: Stylet Placement Confirmation: ETT inserted through vocal cords under direct vision,  positive ETCO2 and breath sounds checked- equal and bilateral Secured at: 23 (cm at teeth) cm Tube secured with: Tape Dental Injury: Teeth and Oropharynx as per pre-operative assessment

## 2011-12-06 NOTE — Transfer of Care (Signed)
Immediate Anesthesia Transfer of Care Note  Patient: [name] [name]  Procedure(s) Performed: Procedure(s) (LRB): ATRIAL FIBRILLATION ABLATION (N/A)  Patient Location: PACU  Anesthesia Type: General  Level of Consciousness: awake, alert  and oriented  Airway & Oxygen Therapy: Patient Spontanous Breathing and Patient connected to nasal cannula oxygen  Post-op Assessment: Report given to PACU RN  Post vital signs: Reviewed and stable  Complications: No apparent anesthesia complications

## 2011-12-06 NOTE — Preoperative (Addendum)
Beta Blockers   Reason not to administer Beta Blockers:Not Applicable, coreg 12/05/11 at 1900

## 2011-12-06 NOTE — H&P (Signed)
[name] [name] is a 61 y.o. male with a h/o typical appearing atrial flutter and persistent atrial fibrillation who presents today for EP consultation. He reports initially being diagnosed with atrial fibrillation January 2010. He was cardioverted 2/10 to sinus but quickly returned to afib. He was placed on multaq without improvement. He was placed on amiodarone and did very well. He was seen by me 06/2008 at which time he presented in typical appearing atrial flutter. We had planned to proceed with ablation for atrial flutter and atrial fibrillation. He ultimately decided to decline ablation and did not follow-up with me. He continued amiodarone for a while and did well. Dr Amil Amen decided to stop amiodarone and retry multaq. He developed recurrent atrial fibrillation. Over the past 6 months, he has had increasing frequency and duration of atrial fibrillation. He has tried flecainide without relieve. Presently, he is anticoagulated with xarelto. He reports increasing frequency and duration of atrial fibrillation. He feels that he is in afib most of the time. During afib, he reports feeling very "washed out". He has no energy and feels that his "legs weight 150 lbs each". He has exertional SOB as well as frequent palpitations.  Today, he denies symptoms of chest pain, orthopnea, PND, lower extremity edema, presyncope, syncope, or neurologic sequela. He has occasional postural dizziness. The patient is tolerating medications without difficulties and is otherwise without complaint today.   Past Medical History   Diagnosis  Date   .  Hypertension    .  Persistent atrial fibrillation    .  Renal calculi    .  Chronic systolic CHF (congestive heart failure)      LVEF previously 35% felt to be due tachycardia   .  Diabetes mellitus    .  Obesities, morbid    .  Hypothyroid    .  Gastric ulcer      s/p prior surgery   .  Diverticulosis    .  Erectile dysfunction    .  Obstructive sleep apnea     noncompliant with CPAP    Past Surgical History   Procedure  Date   .  Wrist sx  1980     right   .  Knee sx  1985     both   .  Benign stomach tumor removal  2000   .  Thyroidectomy  2009     cysts removal   .  Laparoscopic retropubic prostatectomy  02/2007     hx prostate cancer   .  Kidney stone removal      multiple   .  Tee without cardioversion  08/25/2011     Procedure: TRANSESOPHAGEAL ECHOCARDIOGRAM (TEE); Surgeon: Corky Crafts, MD; Location: [facility] ENDOSCOPY; Service: Cardiovascular; Laterality: N/A;   .  Cardioversion  08/25/2011     Procedure: CARDIOVERSION; Surgeon: Corky Crafts, MD; Location: [facility] ENDOSCOPY; Service: Cardiovascular; Laterality: N/A;    Current Outpatient Prescriptions   Medication  Sig  Dispense  Refill   .  carvedilol (COREG) 12.5 MG tablet  Take 2 tablets (25 mg total) by mouth 2 (two) times daily with a meal.  60 tablet  11   .  cholecalciferol (VITAMIN D) 1000 UNITS tablet  Take 1,000 Units by mouth daily.     Marland Kitchen  imipramine (TOFRANIL) 25 MG tablet  Take 25 mg by mouth at bedtime. 2 tabs at HS     .  levothyroxine (SYNTHROID, LEVOTHROID) 200 MCG tablet  Take 200 mcg by mouth  daily.     .  metFORMIN (GLUCOPHAGE) 500 MG tablet  Take 500 mg by mouth 2 (two) times daily with a meal.     .  Omega-3 Fatty Acids (FISH OIL) 1000 MG CAPS  Take 1,000 capsules by mouth daily.     .  pantoprazole (PROTONIX) 40 MG tablet  Take 40 mg by mouth daily.     .  potassium chloride SA (K-DUR,KLOR-CON) 20 MEQ tablet  Take 20 mEq by mouth 2 (two) times daily.     .  pravastatin (PRAVACHOL) 20 MG tablet  Take 20 mg by mouth daily.     .  ramipril (ALTACE) 10 MG capsule  Take 10 mg by mouth daily.     .  Rivaroxaban (XARELTO) 15 MG TABS tablet  Take 15 mg by mouth daily.     Marland Kitchen  triamterene-hydrochlorothiazide (MAXZIDE-25) 37.5-25 MG per tablet  Take 1 tablet by mouth daily.      Allergies   Allergen  Reactions   .  Phenergan (Promethazine Hcl)  Itching      Hallucination   .  Aspirin      Hx of ulcer   .  Bystolic (Nebivolol Hcl)      Bradycardia   .  Calcium Channel Blockers      LE edema   .  Prednisone      Hx of ulcer   .  Nsaids      Hx ulcer    History    Social History   .  Marital Status:  Married     Spouse Name:  N/A     Number of Children:  N/A   .  Years of Education:  N/A    Occupational History   .  Not on file.    Social History Main Topics   .  Smoking status:  Never Smoker   .  Smokeless tobacco:  Not on file   .  Alcohol Use:  No   .  Drug Use:  No   .  Sexually Active:  Yes    Other Topics  Concern   .  Not on file    Social History Narrative    Lives in [place]. Works as a Merchandiser, retail at [employer] History   Problem  Relation  Age of Onset   .  Emphysema  Mother    .  Lung cancer  Father    .  Diabetes  Brother    .  Colon cancer  Neg Hx     ROS- All systems are reviewed and negative except as per the HPI above  Physical Exam:  Filed Vitals:      BP:  110/68   Pulse:  59   Resp:  18   Height:  5\' 8"  (1.727 m)   Weight:  243 lb 1.9 oz (110.279 kg)   SpO2:  98%    GEN- The patient is well appearing, alert and oriented x 3 today.  Head- normocephalic, atraumatic  Eyes- Sclera clear, conjunctiva pink  Ears- hearing intact  Oropharynx- clear  Neck- supple, no JVP  Lymph- no cervical lymphadenopathy  Lungs- Clear to ausculation bilaterally, normal work of breathing  Heart- Regular rate and rhythm, no murmurs, rubs or gallops, PMI not laterally displaced  GI- soft, NT, ND, + BS  Extremities- no clubbing, cyanosis, or edema  MS- no significant deformity or atrophy  Skin- no rash or lesion  Psych- euthymic  mood, full affect  Neuro- strength and sensation are intact  EKG today reveals sinus bradycardia   Echo 09/09/11- EF 60-65%, LA size 44mm, trace MR, mild dilatation of the aortic root  Myoview 06/24/08- reviewed   Assessment and Plan:   Atrial fibrillation - Hillis Range, MD 10/24/2011 1:57 PM Signed  The patient has symptomatic persistent atrial fibrillation and typical appearing atrial flutter. He has failed medical therapy with multaq, flecainide, and amiodarone.  Therapeutic strategies for afib and atrial flutter including medicine and ablation were discussed in detail with the patient today. Risk, benefits, and alternatives to EP study and radiofrequency ablation were also discussed in detail today. These risks include but are not limited to stroke, bleeding, vascular damage, tamponade, perforation, damage to the esophagus, lungs, and other structures, pulmonary vein stenosis, worsening renal function, and death. The patient understands these risk and wishes to proceed. We will therefore proceed with catheter ablation.  As he presents today in sinus with adequate anticoagulation, I will not repeat TEE today.   Atrial flutter - Hillis Range, MD 10/24/2011 1:57 PM Signed  As above

## 2011-12-07 ENCOUNTER — Other Ambulatory Visit: Payer: Self-pay

## 2011-12-07 ENCOUNTER — Telehealth: Payer: Self-pay | Admitting: Physician Assistant

## 2011-12-07 DIAGNOSIS — I4891 Unspecified atrial fibrillation: Secondary | ICD-10-CM

## 2011-12-07 LAB — BASIC METABOLIC PANEL
BUN: 13 mg/dL (ref 6–23)
Chloride: 107 mEq/L (ref 96–112)
GFR calc Af Amer: 71 mL/min — ABNORMAL LOW (ref >90)
Potassium: 3.6 mEq/L (ref 3.5–5.1)

## 2011-12-07 NOTE — Progress Notes (Signed)
Utilization Review Completed.[name] T8/28/2013   

## 2011-12-07 NOTE — Discharge Summary (Signed)
ELECTROPHYSIOLOGY DISCHARGE SUMMARY    Patient ID: [name] [name],  MRN: [id number], DOB/AGE: Aug 27, 1950 61 y.o.  Admit date: 12/06/2011 Discharge date: 12/07/2011  Primary Care Physician: Jarome Matin, MD Primary EP: Hillis Range, MD  Primary Discharge Diagnosis:  1. Paroxysmal AFib s/p RF ablation 2. Typical atrial flutter s/p RF ablation  Secondary Discharge Diagnoses:  1. Chronic systolic HF 2. HTN 3. OSA 4. DM 5. Obesity 6. History of gastric ulcer 7. Hypothyroidism  Procedures This Admission:  1. Comprehensive electrophysiologic study.  2. Coronary sinus pacing and recording.  3. Three-dimensional mapping of atrial fibrillation with additional mapping of a second discrete focus (right atrial flutter isthmus)  4. Ablation of atrial fibrillation with additional ablatioin of a second discrete focus (right atrial flutter isthmus)  5. Arterial blood pressure monitoring.  6. Intracardiac echocardiography.  7. Transseptal puncture of an intact septum.  8. Rotational Angiography with processing at an independent workstation Conclusions: 1. Sinus rhythm upon presentation.  2. Rotational Angiography reveals a moderate sized left atrium with four separate pulmonary veins without evidence of pulmonary vein stenosis.  3. Successful electrical isolation and anatomical encircling of all four pulmonary veins with radiofrequency current.  4. Cavo-tricuspid isthmus ablation was performed with complete bidirectional isthmus block achieved.  5. No inducible arrhythmias following ablation  6. No early apparent complications.  History and Hospital Course:  [name] [name] is a 61 year old gentleman with paroxysmal AFib and typical atrial flutter despite DCCV in the past and multiple AADs who presented on 12/06/2011 and underwent and EP study and both an AFib and atrial flutter ablation with Dr. Johney Frame. The patient tolerated this procedure well without any immediate complication. He remains  hemodynamically stable and afebrile. His groin site is ecchymotic and mildly tender but is intact without significant bleeding or hematoma. He has been given discharge instructions including wound care and activity restrictions. He will follow-up in clinic with a mid-level provider in 4 weeks to reassess his ability to perform work duties (Per Dr. 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 I filled out his required paperwork for his job such that he may return to work in 1 week with desk duties x 4 weeks. He will need to return to see me or my PA in 4 weeks to assess further. I would anticipate that he should be able to return to full duties at that point). He has been seen, examined and deemed stable for discharge today by Dr. Hillis Range.  Discharge Vitals: Blood pressure 144/77, pulse 90, temperature 98.9 F (37.2 C), temperature source Oral, resp. rate 20, height 5\' 8"  (1.727 m), weight 237 lb (107.502 kg), SpO2 97.00%.   Labs: Lab Results  Component Value Date   WBC 6.5 12/06/2011   HGB 13.7 12/06/2011   HCT 38.4* 12/06/2011   MCV 88.7 12/06/2011   PLT 215 12/06/2011     Lab 12/07/11 0600  NA 139  K 3.6  CL 107  CO2 24  BUN 13  CREATININE 1.24  CALCIUM 8.5  PROT --  BILITOT --  ALKPHOS --  ALT --  AST --  GLUCOSE 130*    Basename 12/06/11 0931  INR 1.90*    Disposition:  The patient is being discharged in stable condition.  Follow-up: Follow-up Information    Follow up with Jacolyn Reedy, PA on 12/28/2011. (At 9:00 AM for AFib ablation follow-up to reassess work duties)  Contact information:   [facility] [address], [address] [place], [place] [address] [phone]       Follow up with Hillis Range, MD on 03/14/2012. (At 9:30 AM)    Contact information:   [facility] [address] [place], [place] [address] [phone]       Discharge Medications:    Medication List  As of 12/07/2011  8:52 AM   TAKE these medications         carvedilol 25 MG tablet   Commonly known as: COREG   Take 25 mg by mouth 2 (two) times daily with a meal.      dronedarone 400 MG tablet   Commonly known as: MULTAQ   Take 400 mg by mouth 2 (two) times daily with a meal.      Fish Oil 1000 MG Caps   Take 1,000 capsules by mouth daily.      GLUCOSAMINE PO   Take 1,000 mg by mouth.      imipramine 25 MG tablet   Commonly known as: TOFRANIL   Take 50 mg by mouth at bedtime.      levothyroxine 200 MCG tablet   Commonly known as: SYNTHROID, LEVOTHROID   Take 200 mcg by mouth daily.      MENS 50+ MULTI VITAMIN/MIN PO   Take 1 tablet by mouth daily.      metFORMIN 500 MG tablet   Commonly known as: GLUCOPHAGE   Take 500 mg by mouth 2 (two) times daily with a meal.      pantoprazole 40 MG tablet   Commonly known as: PROTONIX   Take 40 mg by mouth daily.      polycarbophil 625 MG tablet   Commonly known as: FIBERCON   Take 1,250 mg by mouth daily.      potassium chloride SA 20 MEQ tablet   Commonly known as: K-DUR,KLOR-CON   Take 20-40 mEq by mouth 2 (two) times daily. Take 2 tablet is the morning and 1 tablet in the evening.      pravastatin 20 MG tablet   Commonly known as: PRAVACHOL   Take 20 mg by mouth at bedtime.      ramipril 10 MG capsule   Commonly known as: ALTACE   Take 10 mg by mouth 2 (two) times daily.      Rivaroxaban 20 MG Tabs   Commonly known as: XARELTO   Take 20 mg by mouth daily.      Vitamin D 2000 UNITS Caps   Take 2,000 Units by mouth daily.          Duration of Discharge Encounter: Greater than 30 minutes including physician time.  Signed, Rick Duff, PA-C 12/07/2011, 8:52 AM   I have seen, examined the patient, and reviewed the above assessment and plan.  Changes to above are made where necessary.    Co Sign: Hillis Range, MD

## 2011-12-07 NOTE — Progress Notes (Signed)
   SUBJECTIVE: The patient is doing well today.  At this time, he denies chest pain, shortness of breath, or any new concerns.  R groin with echymosis and slightly tender     . bupivacaine      . heparin      . imipramine  50 mg Oral QHS  . levothyroxine  200 mcg Oral Q breakfast  . pantoprazole  40 mg Oral Q breakfast  . potassium chloride SA  20-40 mEq Oral BID  . Rivaroxaban  20 mg Oral Q supper  . sodium chloride  3 mL Intravenous Q12H      OBJECTIVE: Physical Exam: Filed Vitals:   12/06/11 2100 12/06/11 2351 12/07/11 0021 12/07/11 0358  BP: 148/82  136/65   Pulse: 85     Temp:  99.7 F (37.6 C)  99.5 F (37.5 C)  TempSrc:  Oral  Oral  Resp: 20     Height:      Weight:      SpO2: 99% 95%  95%    Intake/Output Summary (Last 24 hours) at 12/07/11 0816 Last data filed at 12/07/11 0300  Gross per 24 hour  Intake   1720 ml  Output   1600 ml  Net    120 ml    Telemetry reveals sinus rhythm with episodes of afib with RVR GEN- The patient is well appearing, alert and oriented x 3 today.   Head- normocephalic, atraumatic Eyes-  Sclera clear, conjunctiva pink Ears- hearing intact Oropharynx- clear Neck- supple, no JVP Lymph- no cervical lymphadenopathy Lungs- Clear to ausculation bilaterally, normal work of breathing Heart- Regular rate and rhythm, no murmurs, rubs or gallops, PMI not laterally displaced GI- soft, NT, ND, + BS Extremities- no clubbing, cyanosis, or edema, moderate ecchymosis of R groin with small hematoma, no bruit Skin- no rash or lesion Psych- euthymic mood, full affect Neuro- strength and sensation are intact  LABS: Basic Metabolic Panel:  Basename 12/07/11 0600 12/06/11 0931  NA 139 143  K 3.6 3.9  CL 107 109  CO2 24 22  GLUCOSE 130* 121*  BUN 13 18  CREATININE 1.24 1.40*  CALCIUM 8.5 9.1  MG -- --  PHOS -- --   Liver Function Tests: No results found for this basename: AST:2,ALT:2,ALKPHOS:2,BILITOT:2,PROT:2,ALBUMIN:2 in the last  72 hours No results found for this basename: LIPASE:2,AMYLASE:2 in the last 72 hours CBC:  Basename 12/06/11 0931  WBC 6.5  NEUTROABS --  HGB 13.7  HCT 38.4*  MCV 88.7  PLT 215   RADIOLOGY: No results found.  ASSESSMENT AND PLAN:  Active Problems:  Atrial fibrillation  Atrial flutter  Obstructive sleep apnea  Hypertension  Obesity  1. Afib- doing well s/p ablation Resume home medicines including coreg, multaq, and xarelto PPI x 6 weeks  DC to home today.  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  I filled out his required paperwork for his job such that he may return to work in 1 week with desk duties x 4 weeks.  He will need to return to see me or my PA in 4 weeks to assess further.  I would anticipate that he should be able to return to full duties at that point. Follow-up with me in 12 weeks for routine post procedure check.  He is instructed to contact me for any concerns.  Hillis Range, MD 12/07/2011 8:16 AM

## 2011-12-07 NOTE — Telephone Encounter (Addendum)
Patient's wife called due to fever Her husband has been sleeping most of the day. He denied any pain anywhere, chills, SOB, nausea but did have ablation procedure yesterday complicated by some bleeding. He took Tylenol 2 hours ago and temp is 100.2 and she is concerned. Given ablation procedure, I recommended they proceed to ER for evaluation. I told her I am worried temp may be higher than 100.2 given Tylenol administration and his sleepiness concerns me given recent ablation procedure yesterday AM. She states he is not necessarily lethargic and is easy to wake up, and insists that they would prefer to avoid ER if possible. I told her it's unfortunately difficult to evaluate this kind of situation over the phone and that would be my recommendation. She plans on waiting at home and rechecking his temp in 3-4 hours and if still elevated beyond 100.4 off of any further tylenol or NSAIDs, she will proceed with him to ER. I told her if she elects to stay at home they should call the office in the morning if he is still having problems for possible OV or further advice. She verbalized gratitude and understanding. Dayna Dunn PA-C

## 2011-12-14 ENCOUNTER — Telehealth: Payer: Self-pay | Admitting: Internal Medicine

## 2011-12-14 NOTE — Telephone Encounter (Signed)
Dr Johney Frame tried to call the doctor and the # we have office is closed.   Called back and spoke with wife  He is on IV Cardizem  Dr Johney Frame is good with there plan

## 2011-12-14 NOTE — Telephone Encounter (Signed)
New problem:  Patient wife calling from hospital @ [facility] [place]. C/O Afib 150 . Patient's attending   Dr, Wyline Beady . [facility]  [phone].  Recently Diverticulitis

## 2011-12-19 ENCOUNTER — Encounter: Payer: Self-pay | Admitting: Physician Assistant

## 2011-12-28 ENCOUNTER — Encounter: Payer: Self-pay | Admitting: Physician Assistant

## 2012-01-03 ENCOUNTER — Encounter: Payer: Self-pay | Admitting: Physician Assistant

## 2012-01-04 ENCOUNTER — Encounter: Payer: Self-pay | Admitting: Physician Assistant

## 2012-01-04 ENCOUNTER — Ambulatory Visit (INDEPENDENT_AMBULATORY_CARE_PROVIDER_SITE_OTHER): Payer: Managed Care, Other (non HMO) | Admitting: Physician Assistant

## 2012-01-04 VITALS — BP 102/78 | HR 57 | Ht 68.0 in | Wt 231.8 lb

## 2012-01-04 DIAGNOSIS — I4892 Unspecified atrial flutter: Secondary | ICD-10-CM

## 2012-01-04 DIAGNOSIS — N189 Chronic kidney disease, unspecified: Secondary | ICD-10-CM

## 2012-01-04 DIAGNOSIS — I4891 Unspecified atrial fibrillation: Secondary | ICD-10-CM

## 2012-01-04 NOTE — Progress Notes (Signed)
[address] [place], [place]  [address] Phone: [phone] Fax:  [phone]  Date:  01/04/2012   Name:  [name]   DOB:  07/20/1950   MRN:  [id number]  PCP:  Garlan Fillers, MD  Primary Cardiologist:  Dr. Eldridge Dace Primary Electrophysiologist:  Dr. Hillis Range    History of Present Illness: [name] is a 61 y.o. male who returns for post hospital follow up.  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 He was evaluated by Dr. Johney Frame 10/24/11. Patient was noted to have persistent atrial fibrillation and typical appearing atrial flutter.  He had failed cardioversion and multiple antiarrhythmic drugs. He was set up for atrial fibrillation and atrial flutter ablation. This was performed 12/06/11. He tolerated the procedure well without complication. He was brought back for followup today in anticipation that he would be a return to full duties at work. Unfortunately, he developed diverticulitis 1 week after d/c from the hospital.  He was hospitalized in [place], [place].  It sounds as though he developed significant anemia and acute renal failure along with paroxymal AFib.  Since d/c he has continued to improve.  His PCP has been following his renal fxn and he was told his creatinine was in the 1.2 range last week.  He has had occasional palpitations that are similar to his AFib.  These are improved since his ablation.  They are short lived and appear to be getting less frequent.  He has occasional chest pain.  No syncope, orthopnea, PND, edema.  Of note, he is now on Xarelto 15 mg QD due to his renal fxn.  He is off his ramipril as well.   Wt Readings from Last 3 Encounters:  01/04/12 231 lb 12.8 oz (105.144 kg)  12/06/11 237 lb (107.502 kg)  12/06/11 237 lb (107.502  kg)     Past Medical History  Diagnosis Date  . Hypertension   . Persistent atrial fibrillation     a. failed DCCV and multiple anti-arrhythmic drugs;   b. s/p  PVI isolation with ablation of AFib 8/13  . Renal calculi   . Chronic systolic CHF (congestive heart failure)     LVEF  previously 35% felt to be due tachycardia  . Diabetes mellitus   . Obesities, morbid   . Hypothyroid   . Gastric ulcer     s/p prior surgery  . Diverticulosis   . Erectile dysfunction   . Obstructive sleep apnea     noncompliant with CPAP  . Atrial flutter     a. s/p RFCA 8/13  . CKD (chronic kidney disease)     hx of a/c renal failure during episode of diverticulitis 9/13 in [name], [place]    Current Outpatient Prescriptions  Medication Sig Dispense Refill  . carvedilol (COREG) 25 MG tablet Take 25 mg by mouth 2 (two) times daily with a meal.      . Cholecalciferol (VITAMIN D) 2000 UNITS CAPS Take 2,000 Units by mouth daily.      Marland Kitchen dronedarone (MULTAQ) 400 MG tablet Take 400 mg by mouth 2 (two) times daily with a meal.      . GLUCOSAMINE PO Take 1,000 mg by mouth.      Marland Kitchen imipramine (TOFRANIL) 25 MG tablet Take 50 mg by mouth at bedtime.       Marland Kitchen  levothyroxine (SYNTHROID, LEVOTHROID) 200 MCG tablet Take 200 mcg by mouth daily.      . metFORMIN (GLUCOPHAGE) 500 MG tablet Take 500 mg by mouth 2 (two) times daily with a meal.       . Multiple Vitamins-Minerals (MENS 50+ MULTI VITAMIN/MIN PO) Take 1 tablet by mouth daily.      . Omega-3 Fatty Acids (FISH OIL) 1000 MG CAPS Take 1,000 capsules by mouth daily.       . pantoprazole (PROTONIX) 40 MG tablet Take 40 mg by mouth daily.       . polycarbophil (FIBERCON) 625 MG tablet Take 1,250 mg by mouth daily.      . potassium chloride SA (K-DUR,KLOR-CON) 20 MEQ tablet Take 20 mEq by mouth as directed. TAKE 2 TABS IN THE AM AND TAKE 1 TAB IN THE PM      . pravastatin (PRAVACHOL) 20 MG tablet Take 20 mg by mouth at bedtime.       . Rivaroxaban (XARELTO) 15 MG TABS  tablet Take 15 mg by mouth daily.      . ramipril (ALTACE) 10 MG capsule Take 10 mg by mouth 2 (two) times daily.         Allergies: Allergies  Allergen Reactions  . Phenergan (Promethazine Hcl) Itching    Hallucination   . Aspirin     Hx of ulcer   . Bystolic (Nebivolol Hcl)     Bradycardia   . Calcium Channel Blockers     LE edema  . Prednisone     Hx of ulcer  . Nsaids     Hx ulcer    History  Substance Use Topics  . Smoking status: Never Smoker   . Smokeless tobacco: Not on file  . Alcohol Use: No     PHYSICAL EXAM: VS:  BP 102/78  Pulse 57  Ht 5\' 8"  (1.727 m)  Wt 231 lb 12.8 oz (105.144 kg)  BMI 35.25 kg/m2 Well nourished, well developed, in no acute distress HEENT: normal Neck: no JVD Cardiac:  normal S1, S2; RRR; no murmur Lungs:  clear to auscultation bilaterally, no wheezing, rhonchi or rales Abd: soft, nontender, no hepatomegaly Ext: no edema; bilateral groins without hematoma or bruit Skin: warm and dry Neuro:  CNs 2-12 intact, no focal abnormalities noted  EKG:  Sinus brady, HR 57, normal axis, no acute changes      ASSESSMENT AND PLAN:  1. Atrial Fibrillation:  Doing well s/p PVI ablation with Dr. Hillis Range.  He is having occasional episodes of AFib, but these are becoming less frequent.  Continue current Rx.  We will request his most recent labs.  Based on his prior renal fxn, I suspect his current dose of Xarelto is adequate.  Follow up with Dr. Hillis Range as planned.  He has already returned to work and is doing well.    2. Atrial Flutter:  Maintaining NSR.  Follow up as noted.  3. Chronic Kidney Disease:  Apparently renal function recently improved.  Will request recent BMET results.    4. Cardiomyopathy:  Currently off Ramipril due to worsening renal fxn during bout of diverticulitis.  He will continue follow up with Dr. Eldridge Dace, his primary cardiologist to manage his CHF medications.    Signed, Tereso Newcomer, PA-C  12:13 PM  01/04/2012

## 2012-01-04 NOTE — Patient Instructions (Addendum)
Your physician recommends that you continue on your current medications as directed. Please refer to the Current Medication list given to you today.   Your physician recommends that you KEEP YOUR follow-up appointment WITH DR. ALLRED December 13,2013

## 2012-01-31 ENCOUNTER — Other Ambulatory Visit: Payer: Self-pay | Admitting: Gastroenterology

## 2012-01-31 DIAGNOSIS — R109 Unspecified abdominal pain: Secondary | ICD-10-CM

## 2012-02-01 ENCOUNTER — Ambulatory Visit
Admission: RE | Admit: 2012-02-01 | Discharge: 2012-02-01 | Disposition: A | Discharge status: Home / Self Care | Payer: Managed Care, Other (non HMO) | Source: Ambulatory Visit | Attending: Gastroenterology | Admitting: Gastroenterology

## 2012-02-01 DIAGNOSIS — R109 Unspecified abdominal pain: Secondary | ICD-10-CM

## 2012-02-01 MED ORDER — IOHEXOL 300 MG/ML  SOLN
80.0000 mL | Freq: Once | INTRAMUSCULAR | Status: AC | PRN
  Administered 2012-02-01: 80 mL via INTRAVENOUS

## 2012-03-02 ENCOUNTER — Encounter: Payer: Self-pay | Admitting: Internal Medicine

## 2012-03-14 ENCOUNTER — Encounter: Payer: Self-pay | Admitting: Internal Medicine

## 2012-03-14 ENCOUNTER — Ambulatory Visit (INDEPENDENT_AMBULATORY_CARE_PROVIDER_SITE_OTHER): Payer: Managed Care, Other (non HMO) | Admitting: Internal Medicine

## 2012-03-14 VITALS — BP 114/72 | HR 76 | Ht 68.0 in | Wt 234.0 lb

## 2012-03-14 DIAGNOSIS — I4891 Unspecified atrial fibrillation: Secondary | ICD-10-CM

## 2012-03-14 DIAGNOSIS — I4892 Unspecified atrial flutter: Secondary | ICD-10-CM

## 2012-03-14 NOTE — Progress Notes (Signed)
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 male who presents today for routine electrophysiology followup.  Since his atrial fibrillation ablation, the patient reports doing very well.  He has had some ERAF post ablation, but feels "much better".  He denies symptoms related to atrial arrhythmias.   Today, he denies symptoms of palpitations, chest pain, shortness of breath,  lower extremity edema, dizziness, presyncope, or syncope.  The patient is otherwise without complaint today.   Past Medical History  Diagnosis Date  . Hypertension   . Persistent atrial fibrillation     a. failed DCCV and multiple anti-arrhythmic drugs;   b. s/p  PVI isolation with ablation of AFib 8/13  . Renal calculi   . Chronic systolic CHF (congestive heart failure)     LVEF  previously 35% felt to be due tachycardia  . Diabetes mellitus   . Obesities, morbid   . Hypothyroid   . Gastric ulcer     s/p prior surgery  . Diverticulosis   . Erectile dysfunction   . Obstructive sleep apnea     noncompliant with CPAP  . Atrial flutter     a. s/p RFCA 8/13  . CKD (chronic kidney disease)     hx of a/c renal failure during episode of diverticulitis 9/13 in [place], [place]   Past Surgical History  Procedure Date  . Wrist sx 1980    right  . Knee sx 1985    both  . Benign stomach tumor removal 2000  . Thyroidectomy 2009    cysts removal   . Laparoscopic retropubic prostatectomy 02/2007    hx prostate cancer  . Kidney stone removal     multiple  . Tee without cardioversion 08/25/2011    Procedure: TRANSESOPHAGEAL ECHOCARDIOGRAM (TEE);  Surgeon: Corky Crafts, MD;  Location: [facility] ENDOSCOPY;  Service: Cardiovascular;  Laterality: N/A;  . Cardioversion 08/25/2011    Procedure: CARDIOVERSION;  Surgeon: Corky Crafts, MD;  Location: [facility] ENDOSCOPY;  Service: Cardiovascular;  Laterality: N/A;  . Tee without cardioversion 11/09/2011    Procedure: TRANSESOPHAGEAL  ECHOCARDIOGRAM (TEE);  Surgeon: Corky Crafts, MD;  Location: [facility] ENDOSCOPY;  Service: Cardiovascular;  Laterality: N/A;  h/p in file drawer  . Atrial fibrillation ablatoin 12/06/11    Afib and atrial flutter ablation by Dr Johney Frame    Current Outpatient Prescriptions  Medication Sig Dispense Refill  . carvedilol (COREG) 25 MG tablet Take 25 mg by mouth 2 (two) times daily with a meal.      . Cholecalciferol (VITAMIN D) 2000 UNITS CAPS Take 2,000 Units by mouth daily.      Marland Kitchen dronedarone (MULTAQ) 400 MG tablet Take 400 mg by mouth 2 (two) times daily with a meal.      . GLUCOSAMINE PO Take 1,000 mg by mouth.      Marland Kitchen imipramine (TOFRANIL) 25 MG tablet Take 50 mg by mouth at bedtime.       Marland Kitchen levothyroxine (SYNTHROID, LEVOTHROID) 200 MCG tablet Take 200 mcg by mouth daily.      . metFORMIN (GLUCOPHAGE) 500 MG tablet Take 500 mg by mouth 2 (two) times daily with a meal.       . Multiple Vitamins-Minerals (MENS 50+ MULTI VITAMIN/MIN PO) Take 1 tablet by mouth daily.      . Omega-3 Fatty Acids (FISH OIL) 1000 MG CAPS Take 1,000 capsules by mouth daily.       . pantoprazole (PROTONIX) 40 MG tablet  Take 40 mg by mouth daily.       . polycarbophil (FIBERCON) 625 MG tablet Take 1,250 mg by mouth daily.      . potassium chloride SA (K-DUR,KLOR-CON) 20 MEQ tablet Take 20 mEq by mouth as directed. TAKE 2 TABS IN THE AM AND TAKE 1 TAB IN THE PM      . pravastatin (PRAVACHOL) 20 MG tablet Take 20 mg by mouth at bedtime.       . ramipril (ALTACE) 10 MG capsule Take 10 mg by mouth daily.       . Rivaroxaban (XARELTO) 15 MG TABS tablet Take 15 mg by mouth daily.        Physical Exam: Filed Vitals:   03/14/12 0947  BP: 114/72  Pulse: 76  Height: 5\' 8"  (1.727 m)  Weight: 234 lb (106.142 kg)  SpO2: 98%    GEN- The patient is well appearing, alert and oriented x 3 today.   Head- normocephalic, atraumatic Eyes-  Sclera clear, conjunctiva pink Ears- hearing intact Oropharynx- clear Lungs- Clear to  ausculation bilaterally, normal work of breathing Heart- Regular rate and rhythm, no murmurs, rubs or gallops, PMI not laterally displaced GI- soft, NT, ND, + BS Extremities- no clubbing, cyanosis, or edema  ekg today reveals sinus rhythm 58 bpm, otherwise normal ekg  Assessment and Plan:  1. Atrial fibrillation/ atrial flutter Doing well s/p ablation No changes today  Return in 3 months If no further arrhythmias, we will likely stop multaq at that time. He should continue long term anticoagulation

## 2012-03-14 NOTE — Patient Instructions (Signed)
Your physician recommends that you schedule a follow-up appointment in 3 months with Dr Allred    

## 2012-04-03 NOTE — Addendum Note (Signed)
Addended by: Burnett Kanaris A on: 04/03/2012 11:14 AM   Modules accepted: Orders

## 2012-05-01 ENCOUNTER — Other Ambulatory Visit: Payer: Self-pay | Admitting: Gastroenterology

## 2012-06-05 ENCOUNTER — Telehealth: Payer: Self-pay | Admitting: Internal Medicine

## 2012-06-05 ENCOUNTER — Ambulatory Visit (INDEPENDENT_AMBULATORY_CARE_PROVIDER_SITE_OTHER): Payer: Managed Care, Other (non HMO) | Admitting: Cardiovascular Disease

## 2012-06-05 ENCOUNTER — Ambulatory Visit (INDEPENDENT_AMBULATORY_CARE_PROVIDER_SITE_OTHER): Payer: Managed Care, Other (non HMO) | Admitting: *Deleted

## 2012-06-05 VITALS — BP 86/60 | HR 110 | Ht 68.0 in | Wt 233.2 lb

## 2012-06-05 DIAGNOSIS — I4891 Unspecified atrial fibrillation: Secondary | ICD-10-CM

## 2012-06-05 DIAGNOSIS — R5383 Other fatigue: Secondary | ICD-10-CM

## 2012-06-05 DIAGNOSIS — I10 Essential (primary) hypertension: Secondary | ICD-10-CM

## 2012-06-05 DIAGNOSIS — I4892 Unspecified atrial flutter: Secondary | ICD-10-CM

## 2012-06-05 LAB — BASIC METABOLIC PANEL
BUN: 25 mg/dL — ABNORMAL HIGH (ref 6–23)
Calcium: 8.9 mg/dL (ref 8.4–10.5)
GFR: 41.64 mL/min — ABNORMAL LOW (ref >60.00)
Potassium: 4.7 mEq/L (ref 3.5–5.1)
Sodium: 139 mEq/L (ref 135–145)

## 2012-06-05 LAB — CBC WITH DIFFERENTIAL/PLATELET
Basophils Absolute: 0 10*3/uL (ref 0.0–0.1)
Eosinophils Relative: 1.3 % (ref 0.0–5.0)
HCT: 43.3 % (ref 39.0–52.0)
Lymphocytes Relative: 30.9 % (ref 12.0–46.0)
Lymphs Abs: 2.3 10*3/uL (ref 0.7–4.0)
Monocytes Relative: 11 % (ref 3.0–12.0)
Platelets: 226 10*3/uL (ref 150.0–400.0)
WBC: 7.5 10*3/uL (ref 4.5–10.5)

## 2012-06-05 NOTE — Progress Notes (Signed)
Pt came into office today for evaluation after recent GI virus for the past several days.  He was unable to take his medications and feels like he is in At Fib again.  He is very SOB with walking and c/o fatigue.  Per Dr Charlton Haws pt was ordered to have a CBC, BMP and EKG.  Labs have been drawn - EKG completed.  Demonstrates At Fib with a rate between 99 and 118 bpm.  Medications reviewed with pt.  All information reviewed with MD who would like the pt to continue current medications as listed.  Pt was instructed increase po intake and if he doesn't start to feel better he should report to ED for hydration.  Pt and wife state understanding.  This information will also be forwarded to Dr Johney Frame for review and further orders related to treatment of pts At Fib.Marland Kitchen

## 2012-06-05 NOTE — Telephone Encounter (Signed)
Per wife - pt has had a GI bug with n/v and diarrhea he has missed some of medications.  Now feels very fatigued and as if he is back in At Fib.  He has a history of At Fib ablation in the past.  Discussed with Dr Eden Emms DOD - pt will come into the office for an EKG, BMP and CBC.  Wife aware.

## 2012-06-05 NOTE — Patient Instructions (Signed)
Continue all medications as listed. Please increase your fluid intake to help with re-hydration. If you continue to feel poorly please report to the ED for IV hydration. Keep your appointment with Dr Johney Frame as scheduled.

## 2012-06-05 NOTE — Telephone Encounter (Signed)
New Problem:    Patient's wife called in because the patient had fallen ill, missed some of his medications and became dehydrated after having his ablation.  Patient's heart is out of rhythm again and she would like to know if the patient needs to have his potassium level checked and how to proceed.  Please call back.

## 2012-06-05 NOTE — Telephone Encounter (Signed)
See office visit documentation for further information

## 2012-06-12 ENCOUNTER — Telehealth: Payer: Self-pay | Admitting: *Deleted

## 2012-06-12 NOTE — Telephone Encounter (Signed)
Spoke with patients wife and he is doing much better. His heart is back in normal rhythm.  They will contact the office if needed

## 2012-06-18 ENCOUNTER — Ambulatory Visit: Payer: Managed Care, Other (non HMO) | Admitting: Internal Medicine

## 2012-07-11 ENCOUNTER — Encounter: Payer: Self-pay | Admitting: Internal Medicine

## 2012-07-11 ENCOUNTER — Ambulatory Visit (INDEPENDENT_AMBULATORY_CARE_PROVIDER_SITE_OTHER): Payer: Managed Care, Other (non HMO) | Admitting: Internal Medicine

## 2012-07-11 VITALS — BP 136/82 | HR 72 | Ht 68.0 in | Wt 238.8 lb

## 2012-07-11 DIAGNOSIS — I4891 Unspecified atrial fibrillation: Secondary | ICD-10-CM

## 2012-07-11 NOTE — Patient Instructions (Addendum)
Your physician wants you to follow-up in: 6 months with Dr. Allred. You will receive a reminder letter in the mail two months in advance. If you don't receive a letter, please call our office to schedule the follow-up appointment.  

## 2012-07-15 NOTE — Progress Notes (Signed)
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  Since his atrial fibrillation ablation, the patient reports doing very well.  He had a GI virus 06/05/12 and developed afib during an episode of severe wretching.  He converted spontaneously to sinus rhythm and has had no further afib since that time.  Today, he denies symptoms of palpitations, chest pain, shortness of breath,  lower extremity edema, dizziness, presyncope, or syncope.  The patient is otherwise without complaint today.   Past Medical History  Diagnosis Date  . Hypertension   . Persistent atrial fibrillation     a. failed DCCV and multiple anti-arrhythmic drugs;   b. s/p  PVI isolation with ablation of AFib 8/13  . Renal calculi   . Chronic systolic CHF (congestive heart failure)     LVEF  previously 35% felt to be due tachycardia  . Diabetes mellitus   . Obesities, morbid   . Hypothyroid   . Gastric ulcer     s/p prior surgery  . Diverticulosis   . Erectile dysfunction   . Obstructive sleep apnea     noncompliant with CPAP  . Atrial flutter     a. s/p RFCA 8/13  . CKD (chronic kidney disease)     hx of a/c renal failure during episode of diverticulitis 9/13 in [place], [place]   Past Surgical History  Procedure Laterality Date  . Wrist sx  1980    right  . Knee sx  1985    both  . Benign stomach tumor removal  2000  . Thyroidectomy  2009    cysts removal   . Laparoscopic retropubic prostatectomy  02/2007    hx prostate cancer  . Kidney stone removal      multiple  . Tee without cardioversion  08/25/2011    Procedure: TRANSESOPHAGEAL ECHOCARDIOGRAM (TEE);  Surgeon: Corky Crafts, MD;  Location: [facility] ENDOSCOPY;  Service: Cardiovascular;  Laterality: N/A;  . Cardioversion  08/25/2011    Procedure: CARDIOVERSION;  Surgeon: Corky Crafts, MD;  Location: [facility] ENDOSCOPY;  Service: Cardiovascular;   Laterality: N/A;  . Tee without cardioversion  11/09/2011    Procedure: TRANSESOPHAGEAL ECHOCARDIOGRAM (TEE);  Surgeon: Corky Crafts, MD;  Location: [facility] ENDOSCOPY;  Service: Cardiovascular;  Laterality: N/A;  h/p in file drawer  . Atrial fibrillation ablatoin  12/06/11    Afib and atrial flutter ablation by Dr Johney Frame    Current Outpatient Prescriptions  Medication Sig Dispense Refill  . carvedilol (COREG) 25 MG tablet Take 25 mg by mouth 2 (two) times daily with a meal.      . Cholecalciferol (VITAMIN D) 2000 UNITS CAPS Take 2,000 Units by mouth daily.      Marland Kitchen dronedarone (MULTAQ) 400 MG tablet Take 400 mg by mouth 2 (two) times daily with a meal.      . GLUCOSAMINE PO Take 1,000 mg by mouth.      Marland Kitchen imipramine (TOFRANIL) 25 MG tablet Take 50 mg by mouth at bedtime.       Marland Kitchen levothyroxine (SYNTHROID, LEVOTHROID) 200 MCG tablet Take 200 mcg by mouth daily.      Marland Kitchen losartan (COZAAR) 100 MG tablet Take 100 mg by mouth daily.      . metFORMIN (GLUCOPHAGE) 500 MG tablet Take 500 mg by mouth 2 (two) times daily with a meal.       . Multiple Vitamins-Minerals (MENS 50+ MULTI VITAMIN/MIN  PO) Take 1 tablet by mouth daily.      . Omega-3 Fatty Acids (FISH OIL) 1000 MG CAPS Take 1,000 capsules by mouth daily.       . pantoprazole (PROTONIX) 40 MG tablet Take 40 mg by mouth daily.       . polycarbophil (FIBERCON) 625 MG tablet Take 1,250 mg by mouth daily.      . potassium chloride SA (K-DUR,KLOR-CON) 20 MEQ tablet Take 20 mEq by mouth as directed. TAKE 2 TABS IN THE AM AND TAKE 1 TAB IN THE PM      . pravastatin (PRAVACHOL) 20 MG tablet Take 20 mg by mouth at bedtime.       . Rivaroxaban (XARELTO) 15 MG TABS tablet Take 15 mg by mouth daily.       No current facility-administered medications for this visit.    Physical Exam: Filed Vitals:   07/11/12 1522  BP: 136/82  Pulse: 72  Height: 5\' 8"  (1.727 m)  Weight: 238 lb 12.8 oz (108.319 kg)    GEN- The patient is well appearing, alert and  oriented x 3 today.   Head- normocephalic, atraumatic Eyes-  Sclera clear, conjunctiva pink Ears- hearing intact Oropharynx- clear Lungs- Clear to ausculation bilaterally, normal work of breathing Heart- Regular rate and rhythm, no murmurs, rubs or gallops, PMI not laterally displaced GI- soft, NT, ND, + BS Extremities- no clubbing, cyanosis, or edema  ekg today reveals sinus rhythm 63 bpm, otherwise normal ekg  Assessment and Plan:  1. Atrial fibrillation/ atrial flutter Doing well s/p ablation Maintaining sinus rhythm except for one episode during extreme GI illness.    No changes today  Return in 6 months If no further arrhythmias, we will likely stop multaq at that time. He should continue long term anticoagulation

## 2013-01-07 ENCOUNTER — Telehealth: Payer: Self-pay | Admitting: Internal Medicine

## 2013-01-07 NOTE — Telephone Encounter (Signed)
Pt having SOB on exertion, pulse 45 at 1230pm, denies chest pain, presuure, thinks he's in a-fib, pls advise

## 2013-01-07 NOTE — Telephone Encounter (Signed)
Returned call to patient's wife she stated she is on her way home to get her husband.Stated he called her in a panic.Stated he is very sob and thinks his heart is out of rhythm.No chest pain.Advised to take husband to [facility] ER.Trish called.

## 2013-01-30 ENCOUNTER — Encounter: Payer: Self-pay | Admitting: *Deleted

## 2013-01-30 ENCOUNTER — Encounter: Payer: Self-pay | Admitting: Physician Assistant

## 2013-01-30 ENCOUNTER — Telehealth: Payer: Self-pay | Admitting: *Deleted

## 2013-01-30 ENCOUNTER — Ambulatory Visit (INDEPENDENT_AMBULATORY_CARE_PROVIDER_SITE_OTHER): Payer: Managed Care, Other (non HMO) | Admitting: Physician Assistant

## 2013-01-30 VITALS — BP 118/70 | HR 97 | Ht 68.0 in | Wt 239.0 lb

## 2013-01-30 DIAGNOSIS — E785 Hyperlipidemia, unspecified: Secondary | ICD-10-CM

## 2013-01-30 DIAGNOSIS — I428 Other cardiomyopathies: Secondary | ICD-10-CM

## 2013-01-30 DIAGNOSIS — I5022 Chronic systolic (congestive) heart failure: Secondary | ICD-10-CM

## 2013-01-30 DIAGNOSIS — I10 Essential (primary) hypertension: Secondary | ICD-10-CM

## 2013-01-30 DIAGNOSIS — I4891 Unspecified atrial fibrillation: Secondary | ICD-10-CM

## 2013-01-30 DIAGNOSIS — N189 Chronic kidney disease, unspecified: Secondary | ICD-10-CM

## 2013-01-30 LAB — CBC WITH DIFFERENTIAL/PLATELET
Basophils Absolute: 0 10*3/uL (ref 0.0–0.1)
Basophils Relative: 0.4 % (ref 0.0–3.0)
Eosinophils Absolute: 0.1 10*3/uL (ref 0.0–0.7)
HCT: 33.5 % — ABNORMAL LOW (ref 39.0–52.0)
Hemoglobin: 11 g/dL — ABNORMAL LOW (ref 13.0–17.0)
Lymphs Abs: 2.5 10*3/uL (ref 0.7–4.0)
MCHC: 32.8 g/dL (ref 30.0–36.0)
MCV: 77.2 fl — ABNORMAL LOW (ref 78.0–100.0)
Monocytes Absolute: 1 10*3/uL (ref 0.1–1.0)
Neutro Abs: 4.9 10*3/uL (ref 1.4–7.7)
RBC: 4.35 Mil/uL (ref 4.22–5.81)
RDW: 15.7 % — ABNORMAL HIGH (ref 11.5–14.6)

## 2013-01-30 LAB — BASIC METABOLIC PANEL
CO2: 25 mEq/L (ref 19–32)
Calcium: 9.5 mg/dL (ref 8.4–10.5)
Chloride: 107 mEq/L (ref 96–112)
Glucose, Bld: 117 mg/dL — ABNORMAL HIGH (ref 70–99)
Potassium: 4.1 mEq/L (ref 3.5–5.1)
Sodium: 141 mEq/L (ref 135–145)

## 2013-01-30 MED ORDER — FLECAINIDE ACETATE 100 MG PO TABS: 100.0000 mg | ORAL_TABLET | Freq: Two times a day (BID) | ORAL | Status: DC

## 2013-01-30 NOTE — Telephone Encounter (Signed)
pt's wife notified about lab results and that pt needs to f/u w/PCP within the nxt 7 days. I did ask to have ov note from PCP to be faxed over to [facility]. Bing Neighbors. PA will d/w results with Dr. Johney Frame to see if will delay ablation. Wife gave verbal understanding to Plan of Care and said she will call PCP in the AM and get appt. I stated I will fax these results to PCP tonight .

## 2013-01-30 NOTE — Patient Instructions (Addendum)
STOP MULTAQ AFTER 48 HOURS BEING OFF THE MULTAQ YOU WILL START FLECAINIDE 100 MG EVERY 12 HOURS   LABS TODAY; BMET, CBC W/DIFF  See instruction sheet for ablation

## 2013-01-30 NOTE — Progress Notes (Signed)
[address] [place], [place]  [address] Phone: [phone] Fax:  [phone]  Date:  01/30/2013   ID:  [name] [name], DOB 05/23/1950, MRN [id number]  PCP:  PATERSON,DANIEL G, MD  Cardiologist:  Dr. Jay Varanasi   Electrophysiologist:  Dr. James Allred    History of Present Illness: [name] [name] is a 62 y.o. male who returns for followup.  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 Myoview (10/12):  No ischemia or infarct, EF 58%.  Echo (5/13): EF 60-65%, mild LAE, trace MR, mildly dilated aortic root and descending aorta.  He was evaluated by Dr. Allred 10/24/11. Patient was noted to have persistent atrial fibrillation and typical appearing atrial flutter.  He had failed cardioversion and multiple antiarrhythmic drugs. He was set up for atrial fibrillation and atrial flutter ablation. This was performed 12/06/11.  He had a gastroenteritis in 05/2012 and developed atrial fibrillation during an episode of severe wretching. He converted to NSR spontaneously. Last seen by Dr. Allred 07/2012.  He was maintaining normal sinus rhythm at that time.  Patient has had recurrent episodes of atrial fibrillation over the last several weeks. When he is in atrial fibrillation, he feels significantly weak with decreased energy, dyspnea and lightheadedness. He denies syncope. He denies chest pain. He denies orthopnea, PND or edema.  Labs (2/14):   K 4.7, creatinine 1.8, Hgb 14.7  Wt Readings from Last 3 Encounters:  01/30/13 239 lb (108.41 kg)  07/11/12 238 lb 12.8 oz (108.319 kg)  06/05/12 233 lb 3.2 oz (105.779 kg)     Past Medical History  Diagnosis Date  . Hypertension   . Persistent atrial fibrillation     a. failed DCCV and multiple anti-arrhythmic drugs;   b. s/p  PVI isolation with  ablation of AFib 8/13  . Renal calculi   . Chronic systolic CHF (congestive heart failure)     LVEF  previously 35% felt to be due tachycardia  . Diabetes mellitus   . Obesities, morbid   . Hypothyroid   . Gastric ulcer     s/p prior surgery  . Diverticulosis   . Erectile dysfunction   . Obstructive sleep apnea     noncompliant with CPAP  . Atrial flutter     a. s/p RFCA 8/13  . CKD (chronic kidney disease)     hx of a/c renal failure during episode of diverticulitis 9/13 in [place], [place]    Current Outpatient Prescriptions  Medication Sig Dispense Refill  . carvedilol (COREG) 25 MG tablet Take 25 mg by mouth 2 (two) times daily with a meal.      . Cholecalciferol (VITAMIN D) 2000 UNITS CAPS Take 2,000 Units by mouth daily.      . dronedarone (MULTAQ) 400 MG tablet Take 400 mg by mouth 2 (two) times daily with a meal.      . GLUCOSAMINE PO Take 1,000 mg by mouth.      . imipramine (TOFRANIL) 25 MG tablet Take 50 mg by mouth at bedtime.       . levothyroxine (SYNTHROID, LEVOTHROID) 200 MCG tablet Take 200 mcg by mouth daily.      . Multiple Vitamins-Minerals (MENS 50+ MULTI VITAMIN/MIN PO) Take 1 tablet by mouth daily.      . Omega-3 Fatty Acids (FISH OIL) 1000   MG CAPS Take 1,000 capsules by mouth daily.       . pantoprazole (PROTONIX) 40 MG tablet Take 40 mg by mouth daily.       . polycarbophil (FIBERCON) 625 MG tablet Take 1,250 mg by mouth daily.      . potassium chloride SA (K-DUR,KLOR-CON) 20 MEQ tablet Take 20 mEq by mouth as directed. TAKE 2 TABS IN THE AM AND TAKE 1 TAB IN THE PM      . pravastatin (PRAVACHOL) 20 MG tablet Take 20 mg by mouth at bedtime.       . Rivaroxaban (XARELTO) 15 MG TABS tablet Take 15 mg by mouth daily.      . losartan (COZAAR) 100 MG tablet Take 100 mg by mouth daily.      . metFORMIN (GLUCOPHAGE) 500 MG tablet Take 500 mg by mouth 2 (two) times daily with a meal.        No current facility-administered medications for this visit.     Allergies:    Allergies  Allergen Reactions  . Phenergan [Promethazine Hcl] Itching    Hallucination   . Aspirin     Hx of ulcer   . Bystolic [Nebivolol Hcl]     Bradycardia   . Calcium Channel Blockers     LE edema  . Prednisone     Hx of ulcer  . Nsaids     Hx ulcer    Social History:  The patient  reports that he has never smoked. He does not have any smokeless tobacco history on file. He reports that he does not drink alcohol or use illicit drugs.   Family History:  The patient's family history includes Diabetes in his brother; Emphysema in his mother; Lung cancer in his father. There is no history of Colon cancer.   ROS:  Please see the history of present illness.      All other systems reviewed and negative.   PHYSICAL EXAM: VS:  BP 118/70  Pulse 97  Ht 5' 8" (1.727 m)  Wt 239 lb (108.41 kg)  BMI 36.35 kg/m2 Well nourished, well developed, in no acute distress HEENT: normal Neck: no JVD at 90 Cardiac:  normal S1, S2; irregularly irregular rhythm; no murmur Lungs:  clear to auscultation bilaterally, no wheezing, rhonchi or rales Abd: soft, nontender, no hepatomegaly Ext: no edema Skin: warm and dry Neuro:  CNs 2-12 intact, no focal abnormalities noted  EKG:  Atrial fibrillation, HR 97     ASSESSMENT AND PLAN:  1. Atrial Fibrillation:  He has recurrent persistent atrial fibrillation. He is quite symptomatic. He remains on anticoagulation therapy with Xarelto. He is also on Multaq. I reviewed his case today with Dr. Allred who also saw the patient. The patient is interested in pursuing further ablation therapy. We will discontinue Multaq. After 48 hours of washout, he will be placed on flecainide 100 mg twice a day. Obtain a basic metabolic panel and CBC today. We will arrange redo atrial fibrillation ablation in the next 2-3 weeks. 2. Systolic CHF: As noted, LV function has recovered. He remains on ARB and beta blocker therapy. 3. Hypertension:  Controlled. 4. CKD: Check basic metabolic panel today. 5. Hyperlipidemia: Continue statin. 6. Disposition: Patient will undergo redo atrial fibrillation ablation in the next 2-3 weeks with Dr. Allred.  Signed, Diyari Cherne, PA-C  01/30/2013 1:12 PM   

## 2013-01-31 ENCOUNTER — Other Ambulatory Visit: Payer: Self-pay | Admitting: Cardiology

## 2013-01-31 ENCOUNTER — Telehealth: Payer: Self-pay | Admitting: Physician Assistant

## 2013-01-31 ENCOUNTER — Ambulatory Visit: Payer: Managed Care, Other (non HMO)

## 2013-01-31 DIAGNOSIS — D638 Anemia in other chronic diseases classified elsewhere: Secondary | ICD-10-CM

## 2013-01-31 LAB — IBC PANEL
Iron: 27 ug/dL — ABNORMAL LOW (ref 42–165)
Transferrin: 354.4 mg/dL (ref 212.0–360.0)

## 2013-01-31 LAB — FERRITIN: Ferritin: 12.5 ng/mL — ABNORMAL LOW (ref 22.0–322.0)

## 2013-01-31 MED ORDER — POTASSIUM CHLORIDE CRYS ER 20 MEQ PO TBCR: EXTENDED_RELEASE_TABLET | ORAL | Status: DC

## 2013-01-31 NOTE — Telephone Encounter (Signed)
New problem:  Pt states he wants his labs from today and yesterday faxed to Dr. Jarold Motto at [facility]. Fax number is [phone]

## 2013-02-01 ENCOUNTER — Telehealth: Payer: Self-pay | Admitting: Internal Medicine

## 2013-02-01 MED ORDER — FERROUS SULFATE 325 (65 FE) MG PO TABS: 325.0000 mg | ORAL_TABLET | Freq: Three times a day (TID) | ORAL | Status: DC

## 2013-02-01 NOTE — Telephone Encounter (Signed)
[place],  Wife states she received a call from a "debbie" in regards to the [facility] appt on 10/28/// Please call if there is information that the patient will need for this appointment.

## 2013-02-01 NOTE — Telephone Encounter (Signed)
Follow up    Wife called back to tell us it was Eunice Blase in triage who called her husband

## 2013-02-01 NOTE — Telephone Encounter (Signed)
All labs sent to his PCP Dr. Ivery Quale and started him on Iron 325mg  tid  Patient's wife aware

## 2013-02-04 ENCOUNTER — Encounter (HOSPITAL_COMMUNITY): Payer: Self-pay | Admitting: Pharmacy Technician

## 2013-02-05 ENCOUNTER — Ambulatory Visit (HOSPITAL_COMMUNITY): Payer: Managed Care, Other (non HMO) | Admitting: Certified Registered"

## 2013-02-05 ENCOUNTER — Encounter (HOSPITAL_COMMUNITY)
Admission: RE | Disposition: A | Discharge status: Home / Self Care | Payer: Self-pay | Source: Ambulatory Visit | Attending: Internal Medicine

## 2013-02-05 ENCOUNTER — Encounter (HOSPITAL_COMMUNITY): Payer: Self-pay | Admitting: Gastroenterology

## 2013-02-05 ENCOUNTER — Encounter (HOSPITAL_COMMUNITY): Payer: Managed Care, Other (non HMO) | Admitting: Certified Registered"

## 2013-02-05 ENCOUNTER — Ambulatory Visit (HOSPITAL_COMMUNITY)
Admission: RE | Admit: 2013-02-05 | Discharge: 2013-02-06 | Disposition: A | Discharge status: Home / Self Care | Payer: Managed Care, Other (non HMO) | Source: Ambulatory Visit | Attending: Internal Medicine | Admitting: Internal Medicine

## 2013-02-05 DIAGNOSIS — Z6836 Body mass index (BMI) 36.0-36.9, adult: Secondary | ICD-10-CM | POA: Insufficient documentation

## 2013-02-05 DIAGNOSIS — E119 Type 2 diabetes mellitus without complications: Secondary | ICD-10-CM | POA: Insufficient documentation

## 2013-02-05 DIAGNOSIS — E785 Hyperlipidemia, unspecified: Secondary | ICD-10-CM | POA: Insufficient documentation

## 2013-02-05 DIAGNOSIS — G4733 Obstructive sleep apnea (adult) (pediatric): Secondary | ICD-10-CM | POA: Insufficient documentation

## 2013-02-05 DIAGNOSIS — Z79899 Other long term (current) drug therapy: Secondary | ICD-10-CM | POA: Insufficient documentation

## 2013-02-05 DIAGNOSIS — I5022 Chronic systolic (congestive) heart failure: Secondary | ICD-10-CM | POA: Insufficient documentation

## 2013-02-05 DIAGNOSIS — N189 Chronic kidney disease, unspecified: Secondary | ICD-10-CM | POA: Insufficient documentation

## 2013-02-05 DIAGNOSIS — E039 Hypothyroidism, unspecified: Secondary | ICD-10-CM | POA: Insufficient documentation

## 2013-02-05 DIAGNOSIS — I129 Hypertensive chronic kidney disease with stage 1 through stage 4 chronic kidney disease, or unspecified chronic kidney disease: Secondary | ICD-10-CM | POA: Insufficient documentation

## 2013-02-05 DIAGNOSIS — E669 Obesity, unspecified: Secondary | ICD-10-CM | POA: Diagnosis present

## 2013-02-05 DIAGNOSIS — I4891 Unspecified atrial fibrillation: Secondary | ICD-10-CM

## 2013-02-05 DIAGNOSIS — I152 Hypertension secondary to endocrine disorders: Secondary | ICD-10-CM | POA: Diagnosis present

## 2013-02-05 DIAGNOSIS — I10 Essential (primary) hypertension: Secondary | ICD-10-CM | POA: Diagnosis present

## 2013-02-05 DIAGNOSIS — Z7901 Long term (current) use of anticoagulants: Secondary | ICD-10-CM | POA: Insufficient documentation

## 2013-02-05 DIAGNOSIS — I509 Heart failure, unspecified: Secondary | ICD-10-CM | POA: Insufficient documentation

## 2013-02-05 DIAGNOSIS — I428 Other cardiomyopathies: Secondary | ICD-10-CM | POA: Insufficient documentation

## 2013-02-05 HISTORY — PX: ATRIAL FIBRILLATION ABLATION: SHX5456

## 2013-02-05 HISTORY — PX: TEE WITHOUT CARDIOVERSION: SHX5443

## 2013-02-05 LAB — POCT ACTIVATED CLOTTING TIME
Activated Clotting Time: 196 seconds
Activated Clotting Time: 222 seconds
Activated Clotting Time: 232 seconds
Activated Clotting Time: 160 seconds

## 2013-02-05 LAB — GLUCOSE, CAPILLARY
Glucose-Capillary: 110 mg/dL — ABNORMAL HIGH (ref 70–99)
Glucose-Capillary: 138 mg/dL — ABNORMAL HIGH (ref 70–99)

## 2013-02-05 LAB — MRSA PCR SCREENING: MRSA by PCR: NEGATIVE

## 2013-02-05 SURGERY — ECHOCARDIOGRAM, TRANSESOPHAGEAL
Anesthesia: Moderate Sedation

## 2013-02-05 SURGERY — ATRIAL FIBRILLATION ABLATION
Anesthesia: General

## 2013-02-05 MED ORDER — "THROMBI-PAD 3""X3"" EX PADS"
1.0000 | MEDICATED_PAD | Freq: Once | CUTANEOUS | Status: AC
  Administered 2013-02-05: 1 via TOPICAL
  Filled 2013-02-05: qty 1

## 2013-02-05 MED ORDER — RIVAROXABAN 15 MG PO TABS
15.0000 mg | ORAL_TABLET | Freq: Every day | ORAL | Status: DC
Start: 2013-02-05 — End: 2013-02-06
  Administered 2013-02-05: 15 mg via ORAL
  Filled 2013-02-05 (×2): qty 1

## 2013-02-05 MED ORDER — FLECAINIDE ACETATE 100 MG PO TABS
100.0000 mg | ORAL_TABLET | Freq: Two times a day (BID) | ORAL | Status: DC
  Administered 2013-02-05 – 2013-02-06 (×2): 100 mg via ORAL
  Filled 2013-02-05 (×3): qty 1

## 2013-02-05 MED ORDER — MIDAZOLAM HCL 5 MG/5ML IJ SOLN
INTRAMUSCULAR | Status: DC | PRN
  Administered 2013-02-05 (×2): 2 mg via INTRAVENOUS
  Administered 2013-02-05: 1 mg via INTRAVENOUS

## 2013-02-05 MED ORDER — FERROUS SULFATE 325 (65 FE) MG PO TABS
325.0000 mg | ORAL_TABLET | Freq: Three times a day (TID) | ORAL | Status: DC
  Administered 2013-02-06: 325 mg via ORAL
  Filled 2013-02-05 (×4): qty 1

## 2013-02-05 MED ORDER — LIDOCAINE HCL (CARDIAC) 20 MG/ML IV SOLN
INTRAVENOUS | Status: DC | PRN
  Administered 2013-02-05: 60 mg via INTRAVENOUS

## 2013-02-05 MED ORDER — MIDAZOLAM HCL 5 MG/ML IJ SOLN
INTRAMUSCULAR | Status: AC
  Filled 2013-02-05: qty 2

## 2013-02-05 MED ORDER — IMIPRAMINE HCL 50 MG PO TABS
50.0000 mg | ORAL_TABLET | Freq: Every day | ORAL | Status: DC
  Administered 2013-02-05: 50 mg via ORAL
  Filled 2013-02-05 (×2): qty 1

## 2013-02-05 MED ORDER — ONDANSETRON HCL 4 MG/2ML IJ SOLN
4.0000 mg | Freq: Four times a day (QID) | INTRAMUSCULAR | Status: DC | PRN
  Administered 2013-02-05: 4 mg via INTRAVENOUS
  Filled 2013-02-05: qty 2

## 2013-02-05 MED ORDER — LEVOTHYROXINE SODIUM 200 MCG PO TABS
200.0000 ug | ORAL_TABLET | Freq: Every day | ORAL | Status: DC
  Administered 2013-02-05: 200 ug via ORAL
  Filled 2013-02-05 (×2): qty 1

## 2013-02-05 MED ORDER — PANTOPRAZOLE SODIUM 40 MG PO TBEC
40.0000 mg | DELAYED_RELEASE_TABLET | Freq: Every day | ORAL | Status: DC
  Administered 2013-02-05 – 2013-02-06 (×2): 40 mg via ORAL
  Filled 2013-02-05 (×2): qty 1

## 2013-02-05 MED ORDER — SODIUM CHLORIDE 0.9 % IJ SOLN: 3.0000 mL | INTRAMUSCULAR | Status: DC | PRN

## 2013-02-05 MED ORDER — FENTANYL CITRATE 0.05 MG/ML IJ SOLN
50.0000 ug | Freq: Once | INTRAMUSCULAR | Status: AC
  Administered 2013-02-05: 50 ug via INTRAVENOUS

## 2013-02-05 MED ORDER — ACETAMINOPHEN 325 MG PO TABS
650.0000 mg | ORAL_TABLET | ORAL | Status: DC | PRN
  Administered 2013-02-06: 650 mg via ORAL
  Filled 2013-02-05: qty 2

## 2013-02-05 MED ORDER — HEPARIN SODIUM (PORCINE) 1000 UNIT/ML IJ SOLN
INTRAMUSCULAR | Status: AC
  Filled 2013-02-05: qty 1

## 2013-02-05 MED ORDER — ROCURONIUM BROMIDE 100 MG/10ML IV SOLN
INTRAVENOUS | Status: DC | PRN
  Administered 2013-02-05: 5 mg via INTRAVENOUS
  Administered 2013-02-05: 10 mg via INTRAVENOUS
  Administered 2013-02-05 (×2): 5 mg via INTRAVENOUS
  Administered 2013-02-05 (×2): 10 mg via INTRAVENOUS

## 2013-02-05 MED ORDER — HYDROCODONE-ACETAMINOPHEN 5-325 MG PO TABS
1.0000 | ORAL_TABLET | ORAL | Status: DC | PRN
  Administered 2013-02-06: 2 via ORAL
  Filled 2013-02-05: qty 2

## 2013-02-05 MED ORDER — MIDAZOLAM HCL 10 MG/2ML IJ SOLN
INTRAMUSCULAR | Status: DC | PRN
  Administered 2013-02-05 (×2): 2 mg via INTRAVENOUS

## 2013-02-05 MED ORDER — CARVEDILOL 25 MG PO TABS
25.0000 mg | ORAL_TABLET | Freq: Two times a day (BID) | ORAL | Status: DC
  Administered 2013-02-06: 25 mg via ORAL
  Filled 2013-02-05 (×3): qty 1

## 2013-02-05 MED ORDER — LACTATED RINGERS IV SOLN
INTRAVENOUS | Status: DC | PRN
  Administered 2013-02-05: 15:00:00 via INTRAVENOUS

## 2013-02-05 MED ORDER — FENTANYL CITRATE 0.05 MG/ML IJ SOLN
INTRAMUSCULAR | Status: DC | PRN
  Administered 2013-02-05: 50 ug via INTRAVENOUS

## 2013-02-05 MED ORDER — ALBUMIN HUMAN 5 % IV SOLN
INTRAVENOUS | Status: DC | PRN
  Administered 2013-02-05: 15:00:00 via INTRAVENOUS

## 2013-02-05 MED ORDER — SODIUM CHLORIDE 0.9 % IV SOLN: 250.0000 mL | INTRAVENOUS | Status: DC | PRN

## 2013-02-05 MED ORDER — SODIUM CHLORIDE 0.9 % IV SOLN
INTRAVENOUS | Status: DC
  Administered 2013-02-05: 500 mL via INTRAVENOUS

## 2013-02-05 MED ORDER — PROPOFOL 10 MG/ML IV BOLUS
INTRAVENOUS | Status: DC | PRN
  Administered 2013-02-05: 200 mg via INTRAVENOUS
  Administered 2013-02-05: 100 mg via INTRAVENOUS

## 2013-02-05 MED ORDER — SODIUM CHLORIDE 0.9 % IJ SOLN
3.0000 mL | Freq: Two times a day (BID) | INTRAMUSCULAR | Status: DC
  Administered 2013-02-05: 3 mL via INTRAVENOUS

## 2013-02-05 MED ORDER — HEPARIN SODIUM (PORCINE) 1000 UNIT/ML IJ SOLN
INTRAMUSCULAR | Status: DC | PRN
  Administered 2013-02-05: 4000 [IU] via INTRAVENOUS
  Administered 2013-02-05: 3000 [IU] via INTRAVENOUS
  Administered 2013-02-05: 10000 [IU] via INTRAVENOUS

## 2013-02-05 MED ORDER — FENTANYL CITRATE 0.05 MG/ML IJ SOLN
INTRAMUSCULAR | Status: DC | PRN
  Administered 2013-02-05 (×2): 25 ug via INTRAVENOUS

## 2013-02-05 MED ORDER — FENTANYL CITRATE 0.05 MG/ML IJ SOLN
INTRAMUSCULAR | Status: AC
  Filled 2013-02-05: qty 2

## 2013-02-05 MED ORDER — NEOSTIGMINE METHYLSULFATE 1 MG/ML IJ SOLN
INTRAMUSCULAR | Status: DC | PRN
  Administered 2013-02-05: 5 mg via INTRAVENOUS

## 2013-02-05 MED ORDER — GLYCOPYRROLATE 0.2 MG/ML IJ SOLN
INTRAMUSCULAR | Status: DC | PRN
  Administered 2013-02-05: 1 mg via INTRAVENOUS

## 2013-02-05 MED ORDER — BUTAMBEN-TETRACAINE-BENZOCAINE 2-2-14 % EX AERO
INHALATION_SPRAY | CUTANEOUS | Status: DC | PRN
  Administered 2013-02-05: 2 via TOPICAL

## 2013-02-05 MED ORDER — FENTANYL CITRATE 0.05 MG/ML IJ SOLN
INTRAMUSCULAR | Status: AC
  Administered 2013-02-05: 50 ug via INTRAVENOUS
  Filled 2013-02-05: qty 2

## 2013-02-05 MED ORDER — PROTAMINE SULFATE 10 MG/ML IV SOLN
INTRAVENOUS | Status: DC | PRN
  Administered 2013-02-05: 30 mg via INTRAVENOUS

## 2013-02-05 MED ORDER — DIPHENHYDRAMINE HCL 50 MG/ML IJ SOLN
INTRAMUSCULAR | Status: AC
  Filled 2013-02-05: qty 1

## 2013-02-05 MED ORDER — SUCCINYLCHOLINE CHLORIDE 20 MG/ML IJ SOLN
INTRAMUSCULAR | Status: DC | PRN
  Administered 2013-02-05: 100 mg via INTRAVENOUS

## 2013-02-05 NOTE — Anesthesia Procedure Notes (Signed)
Procedure Name: Intubation Date/Time: 02/05/2013 2:13 PM Performed by: Ellin Goodie Pre-anesthesia Checklist: Patient identified, Emergency Drugs available, Suction available, Patient being monitored and Timeout performed Patient Re-evaluated:Patient Re-evaluated prior to inductionOxygen Delivery Method: Circle system utilized Preoxygenation: Pre-oxygenation with 100% oxygen Intubation Type: IV induction Ventilation: Mask ventilation without difficulty Laryngoscope Size: Mac and 3 Grade View: Grade II Tube type: Oral Tube size: 7.5 mm Number of attempts: 1 Airway Equipment and Method: Stylet Placement Confirmation: ETT inserted through vocal cords under direct vision,  positive ETCO2 and breath sounds checked- equal and bilateral Secured at: 22 cm Tube secured with: Tape Dental Injury: Teeth and Oropharynx as per pre-operative assessment  Comments: Easy atraumatic induction and intubation.  LMA # 4 attempted with poor seal.  LMA #5 inserted with low tidal volumes noted.  Intubated by Dr Jacklynn Bue with MAC 3 blade.

## 2013-02-05 NOTE — Op Note (Signed)
SURGEON:  Hillis Range, MD  PREPROCEDURE DIAGNOSES: 1. Persistent atrial fibrillation.  POSTPROCEDURE DIAGNOSES: 1. Persistent atrial fibrillation.  PROCEDURES: 1. Comprehensive electrophysiologic study. 2. Coronary sinus pacing and recording. 3. Three-dimensional mapping of atrial fibrillation (with additional mapping and ablation of a additional left and right atrial foci) 4. Ablation of atrial fibrillation (with additional mapping and ablation of additional left and right atrial foci) 5. Intracardiac echocardiography. 6. Transseptal puncture of an intact septum. 7. Arrhythmia induction with pacing 8.External cardioversion.  INTRODUCTION:  [name] [name] is a 62 y.o. male with a history of persistent atrial fibrillation who now presents for EP study and radiofrequency ablation.  The patient reports initially being diagnosed with atrial fibrillation after presenting with symptomatic palpitations and fatgiue.  The patient has failed medical therapy with Multaq.  He underwent afib ablation by me 8/13.  He did very well for a year post ablation.  Unfortunately he has developed recurrent symptomatic persistent afib.  The patient therefore presents today for repeat catheter ablation of atrial fibrillation.  DESCRIPTION OF PROCEDURE:  Informed written consent was obtained, and the patient was brought to the electrophysiology lab in a fasting state.  The patient was adequately sedated with intravenous medications as outlined in the anesthesia report.  The patient's left and right groins were prepped and draped in the usual sterile fashion by the EP lab staff.  Using a percutaneous Seldinger technique, two 7-French and one 11-French hemostasis sheaths were placed into the right common femoral vein.    Catheter Placement:  A 7-French Biosense Webster Decapolar coronary sinus catheter was introduced through the right common femoral vein and advanced into the coronary sinus for recording and pacing  from this location.  A 6-French quadripolar Josephson catheter was introduced through the right common femoral vein and advanced into the right ventricle for recording and pacing.  This catheter was then pulled back to the His bundle location.    Initial Measurements: The patient presented to the electrophysiology lab in atrial fibrillation.    The HV interval 57 msec.     Intracardiac Echocardiography: A 10-French Biosense Webster AcuNav intracardiac echocardiography catheter was introduced through the left common femoral vein and advanced into the right atrium. Intracardiac echocardiography was performed of the left atrium, and a three-dimensional anatomical rendering of the left atrium was performed using CARTO sound technology.  The patient was noted to have a moderate sized left atrium.  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 All 4 pulmonary veins were visualized and noted to have separate ostia.  The pulmonary veins were moderate in size.  The left atrial appendage was visualized and did not reveal thrombus.   There was no evidence of pulmonary vein stenosis.   Transseptal Puncture: The middle right common femoral vein sheath was exchanged for an 8.5 [place] SL2 transseptal sheath and transseptal access was achieved in a standard fashion using a Brockenbrough needle under biplane fluoroscopy with intracardiac echocardiography confirmation of the transseptal puncture.  Once transseptal access had been achieved, heparin was administered intravenously and intra- arterially in order to maintain an ACT of greater than 350 seconds throughout the procedure.   3D Mapping and Ablation: The His bundle catheter was removed and in its place a 3.5 mm [employer] Thermocool ablation catheter was advanced into the right atrium.  The transseptal sheath was pulled back into the IVC over a guidewire.  The ablation catheter was advanced across the transseptal hole using the wire as a guide.  The  transseptal  sheath was then re-advanced over the guidewire into the left atrium.  A duodecapolar Biosense Webster circular mapping catheter was introduced through the transseptal sheath and positioned over the mouth of all 4 pulmonary veins.  Three-dimensional electroanatomical mapping was performed using CARTO technology.  This demonstrated return of electrical activity within the left upper and right lower pulmonary veins.  The left  Lower and right upper PVs were quiescent from the prior ablation procedure.  The patient underwent successful electrical re-isolation and anatomical encircling of the left superior and right inferior pulmonary veins with a circular mapping catheter as a guide.  Complex fractionated electrograms (CFAEs) were then identified and ablated along the roof of the left atrium, along the ridge between the left superior pulmonary vein and the left atrial appendage, the interatrial septum, along the lateral wall of the left atrium, and above the coronary sinus.  CFAEs were also ablated within the coronary sinus.   Cardioversion: The patient was then cardioverted to sinus rhythm with a single synchronized 360-J biphasic shock with cardioversion electrodes in the anterior-posterior thoracic configuration.  He remained in sinus rhythm thereafter.  Measurements Following Ablation: In sinus rhythm the RR interval was , with PR 163 msec, QRS 96 msec, and Qtc 413 msec.  Following ablation the AH interval measured 91 msec with an HV interval of 48 msec. Ventricular pacing was performed, which revealed VA dissociation when pacing at 600 msec.  Rapid atrial pacing was performed, which revealed an AV Wenckebach cycle length of 360 msec with no arrhythmias induced with pacing down to a cycle length of 250 msec.  Electroisolation was then again confirmed in all four pulmonary veins. Differential atrial pacing was performed from the low lateral right atrium.  This demonstrated complete  bidirectional isthmus block (with a stimulus to earliest activation of 160 msec recorded bidirectionally across the isthmus).   The procedure was therefore considered completed.  All catheters were removed, and the sheaths were aspirated and flushed.  The patient was transferred to the recovery area for sheath removal per protocol.  A limited bedside transthoracic echocardiogram revealed no pericardial effusion.  There were no early apparent complications.  CONCLUSIONS: 1. Atrial fibrillation upon presentation.   2. Return of electrical activity within the left upper and right lower pulmonary veins.  The left  Lower and right upper PVs were quiescent from the prior ablation procedure.  The patient underwent successful electrical re-isolation and anatomical encircling of the left superior and right inferior pulmonary veins  3. CFAEs were identified and ablated along the roof of the left atrium,  along the ridge between the left superior pulmonary vein and the left atrial appendage, the interatrial septum, along the lateral wall of the left atrium, and above the coronary sinus.  CFAEs were also ablated within the coronary sinus.  4. Atrial fibrillation successfully cardioverted to sinus rhythm. 5. CTI block persists from a prior ablation 6. No early apparent complications.   Fayrene Fearing Cariah Salatino,MD 4:26 PM 02/05/2013

## 2013-02-05 NOTE — CV Procedure (Signed)
   TEE   INDICATIONS: AFIB, pre ablation  CONCLUSIONS: No thrombus. Trace MR. LA 5.0 x 5.5cm.  See full report for details.    Proceed with ablation. Dr. Johney Frame.

## 2013-02-05 NOTE — Interval H&P Note (Signed)
History and Physical Interval Note:  02/05/2013 9:16 AM  [name] [name]  has presented today for surgery, with the diagnosis of A FIB  The various methods of treatment have been discussed with the patient and family. After consideration of risks, benefits and other options for treatment, the patient has consented to  Procedure(s): TRANSESOPHAGEAL ECHOCARDIOGRAM (TEE) (N/A) as a surgical intervention .  The patient's history has been reviewed, patient examined, no change in status, stable for surgery.  I have reviewed the patient's chart and labs.  Questions were answered to the patient's satisfaction.     Tyland Klemens

## 2013-02-05 NOTE — Discharge Summary (Signed)
ELECTROPHYSIOLOGY PROCEDURE DISCHARGE SUMMARY    Patient ID: [name] [name],  MRN: [id number], DOB/AGE: 1950/06/24 62 y.o.  Admit date: 02/05/2013 Discharge date: 02/05/2013  Primary Care Physician: Jarome Matin, MD Primary Cardiologist: Everette Rank, MD Electrophysiologist: Hillis Range, MD  Primary Discharge Diagnosis:  Persistent atrial fibrillation status post electrophysiology study and radiofrequency catheter ablation this admission  Secondary Discharge Diagnosis:  1.  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  Diabetes 3.  Hypothyroidism 4.  Sleep apnea 5.  Hypertension 6.  Obesity  Procedures This Admission:  1.  TEE on 02-05-2013 by Dr Anne Fu demonstrated no thrombus, trace MR, LA 5 X 5.5cm. 2.  Electrophysiology study and radiofrequency catheter ablation on 02-05-2013 by Dr Johney Frame.  This study demonstrated atrial fibrillation upon presentation; return of electrical activity within the left upper and right lower pulmonary veins. The left Lower and right upper PVs were quiescent from the prior ablation procedure. The patient underwent successful electrical re-isolation and anatomical encircling of the left superior and right inferior pulmonary veins; CFAEs were identified and ablated along the roof of the left atrium, along the ridge between the left superior pulmonary vein and the left atrial appendage, the interatrial septum, along the lateral wall of the left atrium, and above the coronary sinus. CFAEs were also ablated within the coronary sinus; atrial fibrillation successfully cardioverted to sinus rhythm; CTI block persists from a prior ablation.  There were no early apparent complications.   Brief HPI: [name] [name] is a 62 y.o. male with a history of persistent atrial fibrillation who now presents for EP study and radiofrequency ablation. The patient  reports initially being diagnosed with atrial fibrillation after presenting with symptomatic palpitations and fatgiue. The patient has failed medical therapy with Multaq. He underwent afib ablation by me 8/13. He did very well for a year post ablation. Unfortunately he has developed recurrent symptomatic persistent afib. The patient therefore presents today for repeat catheter ablation of atrial fibrillation.  Hospital Course:  The patient was admitted and underwent TEE with electrophysiology study and radiofrequency catheter ablation with details as outlined above.  He was monitored on telemetry overnight which demonstrated sinus rhythm with PAC's.  His groin was without complication.  He was evaluated by Dr Johney Frame and considered stable for discharge to home.   Physical Exam: Filed Vitals:   02/06/13 0200 02/06/13 0348 02/06/13 0400 02/06/13 0830  BP: 101/57 143/77  159/93  Pulse: 96 99  108  Temp:   97.5 F (36.4 C) 98.7 F (37.1 C)  TempSrc:   Oral Oral  Resp: 29 21  26   Height:      Weight:      SpO2: 90% 92%  96%    GEN- The patient is well appearing, alert and oriented x 3 today.   Head- normocephalic, atraumatic Eyes-  Sclera clear, conjunctiva pink Ears- hearing intact Oropharynx- clear Neck- supple, no JVP Lymph- no cervical lymphadenopathy Lungs- Clear to ausculation bilaterally, normal work of breathing Heart- Regular rate and rhythm, no murmurs, rubs or gallops, PMI not laterally displaced GI- soft, NT, ND, + BS Extremities- no clubbing, cyanosis, or edema, no hematoma/ bruit MS- no significant deformity or atrophy Skin- no rash or lesion Psych- euthymic mood, full affect Neuro- strength and sensation are intact   Labs:   Lab Results  Component Value Date   WBC 8.6 01/30/2013  HGB 11.0* 01/30/2013   HCT 33.5* 01/30/2013   MCV 77.2* 01/30/2013   PLT 276.0 01/30/2013    Recent Labs Lab 01/30/13 1354  NA 141  K 4.1  CL 107  CO2 25  BUN 20  CREATININE  1.7*  CALCIUM 9.5  GLUCOSE 117*     Discharge Medications:    Medication List    ASK your doctor about these medications       carvedilol 25 MG tablet  Commonly known as:  COREG  Take 25 mg by mouth 2 (two) times daily with a meal.     ferrous sulfate 325 (65 FE) MG tablet  Take 1 tablet (325 mg total) by mouth 3 (three) times daily with meals.     Fish Oil 1000 MG Caps  Take 1,000 capsules by mouth daily.     flecainide 100 MG tablet  Commonly known as:  TAMBOCOR  Take 1 tablet (100 mg total) by mouth 2 (two) times daily.     GLUCOSAMINE PO  Take 1,000 mg by mouth.     imipramine 25 MG tablet  Commonly known as:  TOFRANIL  Take 50 mg by mouth at bedtime.     levothyroxine 200 MCG tablet  Commonly known as:  SYNTHROID, LEVOTHROID  Take 200 mcg by mouth daily.     MENS 50+ MULTI VITAMIN/MIN PO  Take 1 tablet by mouth daily.     pantoprazole 40 MG tablet  Commonly known as:  PROTONIX  Take 40 mg by mouth daily.     polycarbophil 625 MG tablet  Commonly known as:  FIBERCON  Take 1,250 mg by mouth daily.     potassium chloride SA 20 MEQ tablet  Commonly known as:  K-DUR,KLOR-CON  TAKE 2 TABS IN THE AM AND TAKE 1 TAB IN THE PM     potassium chloride SA 20 MEQ tablet  Commonly known as:  K-DUR,KLOR-CON  Take 20-40 mEq by mouth 2 (two) times daily. Takes 2 tablets in am and 1 tablet in pm.     pravastatin 20 MG tablet  Commonly known as:  PRAVACHOL  Take 10 mg by mouth at bedtime.     Rivaroxaban 15 MG Tabs tablet  Commonly known as:  XARELTO  Take 15 mg by mouth daily.     Vitamin D 2000 UNITS Caps  Take 2,000 Units by mouth daily.        Disposition:       Future Appointments Provider Department Dept Phone   05/08/2013 10:00 AM Hillis Range, MD [facility] [place] Office [phone]   08/27/2013 3:00 PM Everette Rank, MD [facility] [phone]       Duration of Discharge Encounter: Greater than 30 minutes including  physician time.  Signed, Hillis Range MD

## 2013-02-05 NOTE — Interval H&P Note (Signed)
History and Physical Interval Note:  02/05/2013 12:00 PM  [name] [name]  has presented today for surgery, with the diagnosis of Afib  The various methods of treatment have been discussed with the patient and family. After consideration of risks, benefits and other options for treatment, the patient has consented to  Procedure(s): ATRIAL FIBRILLATION ABLATION (N/A) as a surgical intervention .  The patient's history has been reviewed, patient examined, no change in status, stable for surgery.  I have reviewed the patient's chart and labs.  Questions were answered to the patient's satisfaction.     Hillis Range

## 2013-02-05 NOTE — H&P (View-Only) (Signed)
[address] [place], [place]  [address] Phone: [phone] Fax:  [phone]  Date:  01/30/2013   ID:  [name] [name], DOB [birth year], MRN [id number]  PCP:  Garlan Fillers, MD  Cardiologist:  Dr. Everette Rank   Electrophysiologist:  Dr. Hillis Range    History of Present Illness: [name] [name] is a 62 y.o. male who returns for followup.  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 Myoview (10/12):  No ischemia or infarct, EF 58%.  Echo (5/13): EF 60-65%, mild LAE, trace MR, mildly dilated aortic root and descending aorta.  He was evaluated by Dr. Johney Frame 10/24/11. Patient was noted to have persistent atrial fibrillation and typical appearing atrial flutter.  He had failed cardioversion and multiple antiarrhythmic drugs. He was set up for atrial fibrillation and atrial flutter ablation. This was performed 12/06/11.  He had a gastroenteritis in 05/2012 and developed atrial fibrillation during an episode of severe wretching. He converted to NSR spontaneously. Last seen by Dr. Johney Frame 07/2012.  He was maintaining normal sinus rhythm at that time.  Patient has had recurrent episodes of atrial fibrillation over the last several weeks. When he is in atrial fibrillation, he feels significantly weak with decreased energy, dyspnea and lightheadedness. He denies syncope. He denies chest pain. He denies orthopnea, PND or edema.  Labs (2/14):   K 4.7, creatinine 1.8, Hgb 14.7  Wt Readings from Last 3 Encounters:  01/30/13 239 lb (108.41 kg)  07/11/12 238 lb 12.8 oz (108.319 kg)  06/05/12 233 lb 3.2 oz (105.779 kg)     Past Medical History  Diagnosis Date  . Hypertension   . Persistent atrial fibrillation     a. failed DCCV and multiple anti-arrhythmic drugs;   b. s/p  PVI isolation with  ablation of AFib 8/13  . Renal calculi   . Chronic systolic CHF (congestive heart failure)     LVEF  previously 35% felt to be due tachycardia  . Diabetes mellitus   . Obesities, morbid   . Hypothyroid   . Gastric ulcer     s/p prior surgery  . Diverticulosis   . Erectile dysfunction   . Obstructive sleep apnea     noncompliant with CPAP  . Atrial flutter     a. s/p RFCA 8/13  . CKD (chronic kidney disease)     hx of a/c renal failure during episode of diverticulitis 9/13 in [place], [place]    Current Outpatient Prescriptions  Medication Sig Dispense Refill  . carvedilol (COREG) 25 MG tablet Take 25 mg by mouth 2 (two) times daily with a meal.      . Cholecalciferol (VITAMIN D) 2000 UNITS CAPS Take 2,000 Units by mouth daily.      Marland Kitchen dronedarone (MULTAQ) 400 MG tablet Take 400 mg by mouth 2 (two) times daily with a meal.      . GLUCOSAMINE PO Take 1,000 mg by mouth.      Marland Kitchen imipramine (TOFRANIL) 25 MG tablet Take 50 mg by mouth at bedtime.       Marland Kitchen levothyroxine (SYNTHROID, LEVOTHROID) 200 MCG tablet Take 200 mcg by mouth daily.      . Multiple Vitamins-Minerals (MENS 50+ MULTI VITAMIN/MIN PO) Take 1 tablet by mouth daily.      . Omega-3 Fatty Acids (FISH OIL) 1000  MG CAPS Take 1,000 capsules by mouth daily.       . pantoprazole (PROTONIX) 40 MG tablet Take 40 mg by mouth daily.       . polycarbophil (FIBERCON) 625 MG tablet Take 1,250 mg by mouth daily.      . potassium chloride SA (K-DUR,KLOR-CON) 20 MEQ tablet Take 20 mEq by mouth as directed. TAKE 2 TABS IN THE AM AND TAKE 1 TAB IN THE PM      . pravastatin (PRAVACHOL) 20 MG tablet Take 20 mg by mouth at bedtime.       . Rivaroxaban (XARELTO) 15 MG TABS tablet Take 15 mg by mouth daily.      Marland Kitchen losartan (COZAAR) 100 MG tablet Take 100 mg by mouth daily.      . metFORMIN (GLUCOPHAGE) 500 MG tablet Take 500 mg by mouth 2 (two) times daily with a meal.        No current facility-administered medications for this visit.     Allergies:    Allergies  Allergen Reactions  . Phenergan [Promethazine Hcl] Itching    Hallucination   . Aspirin     Hx of ulcer   . Bystolic [Nebivolol Hcl]     Bradycardia   . Calcium Channel Blockers     LE edema  . Prednisone     Hx of ulcer  . Nsaids     Hx ulcer    Social History:  The patient  reports that he has never smoked. He does not have any smokeless tobacco history on file. He reports that he does not drink alcohol or use illicit drugs.   Family History:  The patient's family history includes Diabetes in his brother; Emphysema in his mother; Lung cancer in his father. There is no history of Colon cancer.   ROS:  Please see the history of present illness.      All other systems reviewed and negative.   PHYSICAL EXAM: VS:  BP 118/70  Pulse 97  Ht 5\' 8"  (1.727 m)  Wt 239 lb (108.41 kg)  BMI 36.35 kg/m2 Well nourished, well developed, in no acute distress HEENT: normal Neck: no JVD at 90 Cardiac:  normal S1, S2; irregularly irregular rhythm; no murmur Lungs:  clear to auscultation bilaterally, no wheezing, rhonchi or rales Abd: soft, nontender, no hepatomegaly Ext: no edema Skin: warm and dry Neuro:  CNs 2-12 intact, no focal abnormalities noted  EKG:  Atrial fibrillation, HR 97     ASSESSMENT AND PLAN:  1. Atrial Fibrillation:  He has recurrent persistent atrial fibrillation. He is quite symptomatic. He remains on anticoagulation therapy with Xarelto. He is also on Multaq. I reviewed his case today with Dr. Johney Frame who also saw the patient. The patient is interested in pursuing further ablation therapy. We will discontinue Multaq. After 48 hours of washout, he will be placed on flecainide 100 mg twice a day. Obtain a basic metabolic panel and CBC today. We will arrange redo atrial fibrillation ablation in the next 2-3 weeks. 2. Systolic CHF: As noted, LV function has recovered. He remains on ARB and beta blocker therapy. 3. Hypertension:  Controlled. 4. CKD: Check basic metabolic panel today. 5. Hyperlipidemia: Continue statin. 6. Disposition: Patient will undergo redo atrial fibrillation ablation in the next 2-3 weeks with Dr. Johney Frame.  Signed, Tereso Newcomer, PA-C  01/30/2013 1:12 PM

## 2013-02-05 NOTE — Progress Notes (Signed)
*  PRELIMINARY RESULTS* Echocardiogram 2D Echocardiogram has been performed.  Jeryl Columbia 02/05/2013, 10:43 AM

## 2013-02-05 NOTE — Transfer of Care (Signed)
Immediate Anesthesia Transfer of Care Note  Patient: [name] [name]  Procedure(s) Performed: Procedure(s): ATRIAL FIBRILLATION ABLATION (N/A)  Patient Location: PACU  Anesthesia Type:General  Level of Consciousness: awake and alert   Airway & Oxygen Therapy: Patient Spontanous Breathing and Patient connected to nasal cannula oxygen  Post-op Assessment: Report given to PACU RN and Post -op Vital signs reviewed and stable  Post vital signs: Reviewed and stable  Complications: No apparent anesthesia complications

## 2013-02-05 NOTE — Anesthesia Preprocedure Evaluation (Addendum)
Anesthesia Evaluation  Patient identified by MRN, date of birth, ID band Patient awake    Reviewed: Allergy & Precautions, H&P , NPO status   Airway       Dental   Pulmonary sleep apnea ,  breath sounds clear to auscultation        Cardiovascular hypertension, Pt. on medications and Pt. on home beta blockers +CHF + dysrhythmias Atrial Fibrillation Rhythm:Irregular Rate:Normal     Neuro/Psych Anxiety Depression    GI/Hepatic PUD,   Endo/Other  diabetesHypothyroidism   Renal/GU Renal InsufficiencyRenal disease     Musculoskeletal   Abdominal (+) + obese,   Peds  Hematology   Anesthesia Other Findings   Reproductive/Obstetrics                        Anesthesia Physical Anesthesia Plan  ASA: III  Anesthesia Plan: General   Post-op Pain Management:    Induction: Intravenous  Airway Management Planned: LMA  Additional Equipment:   Intra-op Plan:   Post-operative Plan: Extubation in OR  Informed Consent: I have reviewed the patients History and Physical, chart, labs and discussed the procedure including the risks, benefits and alternatives for the proposed anesthesia with the patient or authorized representative who has indicated his/her understanding and acceptance.   Dental advisory given  Plan Discussed with: CRNA and Surgeon  Anesthesia Plan Comments:         Anesthesia Quick Evaluation

## 2013-02-06 ENCOUNTER — Telehealth: Payer: Self-pay | Admitting: Internal Medicine

## 2013-02-06 ENCOUNTER — Encounter (HOSPITAL_COMMUNITY): Payer: Self-pay | Admitting: Cardiology

## 2013-02-06 LAB — BASIC METABOLIC PANEL WITH GFR
BUN: 23 mg/dL (ref 6–23)
CO2: 22 meq/L (ref 19–32)
Calcium: 7.9 mg/dL — ABNORMAL LOW (ref 8.4–10.5)
Chloride: 106 meq/L (ref 96–112)
Creatinine, Ser: 1.55 mg/dL — ABNORMAL HIGH (ref 0.50–1.35)
GFR calc Af Amer: 54 mL/min — ABNORMAL LOW
GFR calc non Af Amer: 46 mL/min — ABNORMAL LOW
Glucose, Bld: 123 mg/dL — ABNORMAL HIGH (ref 70–99)
Potassium: 3.8 meq/L (ref 3.5–5.1)
Sodium: 138 meq/L (ref 135–145)

## 2013-02-06 MED ORDER — HYDROCORTISONE 1 % EX CREA
1.0000 "application " | TOPICAL_CREAM | Freq: Three times a day (TID) | CUTANEOUS | Status: DC | PRN
  Filled 2013-02-06: qty 28

## 2013-02-06 NOTE — Telephone Encounter (Signed)
New message     Had ablation yesterday---has had headache all day today; tylenol is not working.  Will Dr Johney Frame call in something?---[place]

## 2013-02-06 NOTE — Progress Notes (Signed)
Pt d/c home.  Pt back in afib, Dr. Johney Frame aware.  D/C instructions given and pt stated understanding.

## 2013-02-06 NOTE — Telephone Encounter (Signed)
Spoke with patient's wife and let her know that Dr Johney Frame does not typically give pain medications out after an ablation for HA's  She thinks it is because he was not given his migraine medication last night.  I have asked her to go ahead and give to him early tonight  She is going to do so.

## 2013-02-06 NOTE — Anesthesia Postprocedure Evaluation (Signed)
  Anesthesia Post-op Note  Patient: [name] [name]  Procedure(s) Performed: Procedure(s): ATRIAL FIBRILLATION ABLATION (N/A)  Patient Location: PACU  Anesthesia Type:General  Level of Consciousness: awake  Airway and Oxygen Therapy: Patient Spontanous Breathing  Post-op Pain: none  Post-op Assessment: Post-op Vital signs reviewed, Patient's Cardiovascular Status Stable, Respiratory Function Stable, Patent Airway, No signs of Nausea or vomiting and Pain level controlled  Post-op Vital Signs: Reviewed and stable  Complications: No apparent anesthesia complications

## 2013-02-12 ENCOUNTER — Telehealth: Payer: Self-pay | Admitting: Internal Medicine

## 2013-02-12 NOTE — Telephone Encounter (Signed)
New Problem:  Pt's wife states she will be dropping off FMLA paperwork either today or tomorrow. Pt's wife would like this to be completed as soon as possible. Pt's wife would like to be called when it's completed.

## 2013-02-12 NOTE — Telephone Encounter (Signed)
Patient left paper work with Selena Batten and Selena Batten left him a message that he had not signed a release and there is a 7-14 day turn around time for the paperwork to be done

## 2013-02-13 NOTE — Telephone Encounter (Signed)
Follow Up   Pt states that the representatives name was Selena Batten

## 2013-02-13 NOTE — Telephone Encounter (Signed)
Follow up    Pt brought a letter  (FMLA and return to work) by for Dr Johney Frame to sign. Someone called and left a msg saying that the papers had been forward to someone else.  They need to know where the papers are so that they can pick it up.

## 2013-02-17 ENCOUNTER — Encounter (HOSPITAL_COMMUNITY): Payer: Self-pay | Admitting: *Deleted

## 2013-02-19 NOTE — Telephone Encounter (Signed)
New Problem:  Pt's wife states she is calling to get an update on her husband's FMLA paperwork. Pt's wife states Medical Records told her the paperwork was with the doctor. Please advise

## 2013-02-19 NOTE — Telephone Encounter (Signed)
Pt's wife is aware that pt's FMLA paper are ready for him to peak them up/ per [facility].  Pt's wife verbalized understanding.

## 2013-03-04 ENCOUNTER — Encounter (HOSPITAL_COMMUNITY): Payer: Self-pay | Admitting: Emergency Medicine

## 2013-03-04 ENCOUNTER — Ambulatory Visit (INDEPENDENT_AMBULATORY_CARE_PROVIDER_SITE_OTHER): Payer: Managed Care, Other (non HMO) | Admitting: Emergency Medicine

## 2013-03-04 ENCOUNTER — Inpatient Hospital Stay (HOSPITAL_COMMUNITY)
Admission: EM | Admit: 2013-03-04 | Discharge: 2013-03-06 | DRG: 308 | Disposition: A | Discharge status: Home / Self Care | Payer: Managed Care, Other (non HMO) | Attending: Interventional Cardiology | Admitting: Interventional Cardiology

## 2013-03-04 ENCOUNTER — Emergency Department (HOSPITAL_COMMUNITY): Payer: Managed Care, Other (non HMO)

## 2013-03-04 VITALS — BP 130/90 | HR 102 | Temp 99.2°F | Resp 22 | Ht 66.75 in | Wt 251.8 lb

## 2013-03-04 DIAGNOSIS — I129 Hypertensive chronic kidney disease with stage 1 through stage 4 chronic kidney disease, or unspecified chronic kidney disease: Secondary | ICD-10-CM | POA: Diagnosis present

## 2013-03-04 DIAGNOSIS — I5031 Acute diastolic (congestive) heart failure: Secondary | ICD-10-CM

## 2013-03-04 DIAGNOSIS — I4891 Unspecified atrial fibrillation: Secondary | ICD-10-CM

## 2013-03-04 DIAGNOSIS — I428 Other cardiomyopathies: Secondary | ICD-10-CM | POA: Diagnosis present

## 2013-03-04 DIAGNOSIS — R0602 Shortness of breath: Secondary | ICD-10-CM

## 2013-03-04 DIAGNOSIS — I5043 Acute on chronic combined systolic (congestive) and diastolic (congestive) heart failure: Secondary | ICD-10-CM | POA: Diagnosis present

## 2013-03-04 DIAGNOSIS — E119 Type 2 diabetes mellitus without complications: Secondary | ICD-10-CM | POA: Diagnosis present

## 2013-03-04 DIAGNOSIS — N1831 Chronic kidney disease, stage 3a: Secondary | ICD-10-CM

## 2013-03-04 DIAGNOSIS — N183 Chronic kidney disease, stage 3 unspecified: Secondary | ICD-10-CM | POA: Diagnosis present

## 2013-03-04 DIAGNOSIS — I152 Hypertension secondary to endocrine disorders: Secondary | ICD-10-CM | POA: Diagnosis present

## 2013-03-04 DIAGNOSIS — J810 Acute pulmonary edema: Secondary | ICD-10-CM | POA: Insufficient documentation

## 2013-03-04 DIAGNOSIS — Z7901 Long term (current) use of anticoagulants: Secondary | ICD-10-CM

## 2013-03-04 DIAGNOSIS — E039 Hypothyroidism, unspecified: Secondary | ICD-10-CM | POA: Diagnosis present

## 2013-03-04 DIAGNOSIS — N189 Chronic kidney disease, unspecified: Secondary | ICD-10-CM

## 2013-03-04 DIAGNOSIS — I509 Heart failure, unspecified: Secondary | ICD-10-CM | POA: Diagnosis present

## 2013-03-04 DIAGNOSIS — E872 Acidosis, unspecified: Secondary | ICD-10-CM | POA: Diagnosis present

## 2013-03-04 DIAGNOSIS — E669 Obesity, unspecified: Secondary | ICD-10-CM | POA: Diagnosis present

## 2013-03-04 DIAGNOSIS — N179 Acute kidney failure, unspecified: Secondary | ICD-10-CM | POA: Diagnosis present

## 2013-03-04 DIAGNOSIS — G4733 Obstructive sleep apnea (adult) (pediatric): Secondary | ICD-10-CM | POA: Diagnosis present

## 2013-03-04 DIAGNOSIS — E876 Hypokalemia: Secondary | ICD-10-CM | POA: Diagnosis not present

## 2013-03-04 DIAGNOSIS — I10 Essential (primary) hypertension: Secondary | ICD-10-CM | POA: Diagnosis present

## 2013-03-04 HISTORY — DX: Chronic kidney disease, stage 3 (moderate): N18.3

## 2013-03-04 HISTORY — DX: Chronic combined systolic (congestive) and diastolic (congestive) heart failure: I50.42

## 2013-03-04 HISTORY — DX: Paroxysmal atrial fibrillation: I48.0

## 2013-03-04 HISTORY — DX: Chronic kidney disease, stage 3 unspecified: N18.30

## 2013-03-04 LAB — PRO B NATRIURETIC PEPTIDE: Pro B Natriuretic peptide (BNP): 1535 pg/mL — ABNORMAL HIGH (ref 0–125)

## 2013-03-04 LAB — BASIC METABOLIC PANEL
Chloride: 103 mEq/L (ref 96–112)
GFR calc Af Amer: 39 mL/min — ABNORMAL LOW (ref >90)
GFR calc non Af Amer: 34 mL/min — ABNORMAL LOW (ref >90)
Potassium: 3.6 mEq/L (ref 3.5–5.1)
Sodium: 135 mEq/L (ref 135–145)

## 2013-03-04 LAB — POCT I-STAT TROPONIN I
Troponin i, poc: 0.01 ng/mL (ref 0.00–0.08)
Troponin i, poc: 0.01 ng/mL (ref 0.00–0.08)

## 2013-03-04 LAB — CBC
HCT: 31.7 % — ABNORMAL LOW (ref 39.0–52.0)
Hemoglobin: 10.5 g/dL — ABNORMAL LOW (ref 13.0–17.0)
MCHC: 33.1 g/dL (ref 30.0–36.0)
RBC: 3.9 MIL/uL — ABNORMAL LOW (ref 4.22–5.81)
WBC: 11 10*3/uL — ABNORMAL HIGH (ref 4.0–10.5)

## 2013-03-04 MED ORDER — FUROSEMIDE 10 MG/ML IJ SOLN
20.0000 mg | Freq: Once | INTRAMUSCULAR | Status: AC
  Administered 2013-03-04: 20 mg via INTRAVENOUS
  Filled 2013-03-04: qty 2

## 2013-03-04 MED ORDER — MORPHINE SULFATE 4 MG/ML IJ SOLN
4.0000 mg | Freq: Once | INTRAMUSCULAR | Status: AC
  Administered 2013-03-04: 2 mg via INTRAVENOUS
  Filled 2013-03-04: qty 1

## 2013-03-04 MED ORDER — DILTIAZEM HCL 25 MG/5ML IV SOLN
10.0000 mg | Freq: Once | INTRAVENOUS | Status: AC
  Administered 2013-03-04: 10 mg via INTRAVENOUS
  Filled 2013-03-04: qty 5

## 2013-03-04 MED ORDER — DILTIAZEM HCL 100 MG IV SOLR
5.0000 mg/h | Freq: Once | INTRAVENOUS | Status: AC
  Administered 2013-03-04: 5 mg/h via INTRAVENOUS
  Filled 2013-03-04: qty 100

## 2013-03-04 MED ORDER — METOPROLOL TARTRATE 1 MG/ML IV SOLN: 5.0000 mg | Freq: Once | INTRAVENOUS | Status: DC

## 2013-03-04 NOTE — ED Provider Notes (Signed)
CSN: [id number]     Arrival date & time 03/04/13  2124 History   First MD Initiated Contact with Patient 03/04/13 2126     Chief Complaint  Patient presents with  . Code STEMI   (Consider location/radiation/quality/duration/timing/severity/associated sxs/prior Treatment) HPI  This is a 62 year old male with a history of A. fib who presents with shortness of breath by EMS. Patient was initially presented as a code STEMI. He had a bundle branch block with A. fib with RVR on his EKG. Cardiology is at the bedside and feels that ST elevation is likely secondary to rate. Patient had onset of shortness of breath this morning at 1 AM. He denies any chest pain. He was given 4 baby aspirin and one sublingual nitroglycerin in route. Patient is currently on a nonrebreather. He is satting 100%. Patient appears in distress. He currently denies any chest pain. He denies any recent swelling. Patient was here approximately 3 weeks ago for ablation.  Limited history secondary to patient distress. Level V caveat applies. Past Medical History  Diagnosis Date  . Hypertension   . Persistent atrial fibrillation     a. failed DCCV and multiple anti-arrhythmic drugs;   b. s/p  PVI isolation with ablation of AFib 8/13; c. repeat PVI 01/2013  . Renal calculi   . Chronic systolic CHF (congestive heart failure)     LVEF  previously 35% felt to be due tachycardia  . Diabetes mellitus   . Obesities, morbid   . Hypothyroid   . Gastric ulcer     s/p prior surgery  . Diverticulosis   . Erectile dysfunction   . Obstructive sleep apnea     noncompliant with CPAP  . Atrial flutter     a. s/p RFCA 8/13  . CKD (chronic kidney disease)     hx of a/c renal failure during episode of diverticulitis 9/13 in [place], [place]   Past Surgical History  Procedure Laterality Date  . Wrist sx  1980    right  . Knee sx  1985    both  . Benign stomach tumor removal  2000  . Thyroidectomy  2009    cysts removal   . Laparoscopic  retropubic prostatectomy  02/2007    hx prostate cancer  . Kidney stone removal      multiple  . Tee without cardioversion  08/25/2011    Procedure: TRANSESOPHAGEAL ECHOCARDIOGRAM (TEE);  Surgeon: Corky Crafts, MD;  Location: [facility] ENDOSCOPY;  Service: Cardiovascular;  Laterality: N/A;  . Cardioversion  08/25/2011    Procedure: CARDIOVERSION;  Surgeon: Corky Crafts, MD;  Location: [facility] ENDOSCOPY;  Service: Cardiovascular;  Laterality: N/A;  . Tee without cardioversion  11/09/2011    Procedure: TRANSESOPHAGEAL ECHOCARDIOGRAM (TEE);  Surgeon: Corky Crafts, MD;  Location: [facility] ENDOSCOPY;  Service: Cardiovascular;  Laterality: N/A;  h/p in file drawer  . Atrial fibrillation ablatoin  12/06/11; 02/05/2013    Afib and atrial flutter ablation by Dr Johney Frame; repeat PVI by Dr Johney Frame 02/05/2013  . Tee without cardioversion N/A 02/05/2013    Procedure: TRANSESOPHAGEAL ECHOCARDIOGRAM (TEE);  Surgeon: Donato Schultz, MD;  Location: [facility] ENDOSCOPY;  Service: Cardiovascular;  Laterality: N/A;   Family History  Problem Relation Age of Onset  . Emphysema Mother   . Lung cancer Father   . Diabetes Brother   . Colon cancer Neg Hx    History  Substance Use Topics  . Smoking status: Never Smoker   . Smokeless tobacco: Not on file  . Alcohol  Use: No    Review of Systems  Constitutional: Negative.  Negative for fever.  Respiratory: Positive for shortness of breath. Negative for cough and chest tightness.   Cardiovascular: Positive for leg swelling. Negative for chest pain.  Gastrointestinal: Negative.  Negative for abdominal pain.  Genitourinary: Negative.  Negative for dysuria.  Musculoskeletal: Negative for back pain.  Skin: Negative for rash.  Neurological: Negative for headaches.  All other systems reviewed and are negative.    Allergies  Phenergan; Aspirin; Bystolic; Calcium channel blockers; Prednisone; and Nsaids  Home Medications   Current Outpatient Rx  Name  Route  Sig   Dispense  Refill  . carvedilol (COREG) 25 MG tablet   Oral   Take 25 mg by mouth 2 (two) times daily with a meal.         . Cholecalciferol (VITAMIN D) 2000 UNITS CAPS   Oral   Take 2,000 Units by mouth daily.         . ferrous sulfate 325 (65 FE) MG tablet   Oral   Take 1 tablet (325 mg total) by mouth 3 (three) times daily with meals.   90 tablet   1   . flecainide (TAMBOCOR) 100 MG tablet   Oral   Take 1 tablet (100 mg total) by mouth 2 (two) times daily.   60 tablet   11   . GLUCOSAMINE PO   Oral   Take 1,000 mg by mouth.         Marland Kitchen imipramine (TOFRANIL) 25 MG tablet   Oral   Take 50 mg by mouth at bedtime.          Marland Kitchen levothyroxine (SYNTHROID, LEVOTHROID) 200 MCG tablet   Oral   Take 200 mcg by mouth daily.         . Multiple Vitamins-Minerals (MENS 50+ MULTI VITAMIN/MIN PO)   Oral   Take 1 tablet by mouth daily.         . Omega-3 Fatty Acids (FISH OIL) 1000 MG CAPS   Oral   Take 1,000 capsules by mouth daily.          . pantoprazole (PROTONIX) 40 MG tablet   Oral   Take 40 mg by mouth daily.          . polycarbophil (FIBERCON) 625 MG tablet   Oral   Take 1,250 mg by mouth daily.         . potassium chloride SA (K-DUR,KLOR-CON) 20 MEQ tablet   Oral   Take 20-40 mEq by mouth 2 (two) times daily. 40 MeQ in the AM and 20 MeQ in the PM         . pravastatin (PRAVACHOL) 20 MG tablet   Oral   Take 10 mg by mouth at bedtime.          . Rivaroxaban (XARELTO) 15 MG TABS tablet   Oral   Take 15 mg by mouth daily.          BP 142/97  Pulse 96  Temp(Src) 98.4 F (36.9 C) (Oral)  Resp 21  SpO2 100% Physical Exam  Nursing note and vitals reviewed. Constitutional: He is oriented to person, place, and time.  Patient appears in distress, nonrebreather in place, diaphoretic  HENT:  Head: Normocephalic and atraumatic.  Eyes: Pupils are equal, round, and reactive to light.  Neck: Neck supple. JVD present.  Cardiovascular: Normal heart  sounds.   No murmur heard. Tachycardia, irregular rhythm  Pulmonary/Chest: He is in respiratory  distress.  Rales and wheezing bilaterally  Abdominal: Soft. Bowel sounds are normal. There is no tenderness. There is no rebound.  Musculoskeletal: He exhibits edema.  Lymphadenopathy:    He has no cervical adenopathy.  Neurological: He is alert and oriented to person, place, and time.  Skin: Skin is warm and dry.  Psychiatric: He has a normal mood and affect.    ED Course  Procedures (including critical care time) Labs Review Labs Reviewed  BASIC METABOLIC PANEL - Abnormal; Notable for the following:    CO2 18 (*)    Glucose, Bld 146 (*)    BUN 24 (*)    Creatinine, Ser 2.00 (*)    GFR calc non Af Amer 34 (*)    GFR calc Af Amer 39 (*)    All other components within normal limits  CBC - Abnormal; Notable for the following:    WBC 11.0 (*)    RBC 3.90 (*)    Hemoglobin 10.5 (*)    HCT 31.7 (*)    RDW 21.6 (*)    All other components within normal limits  PRO B NATRIURETIC PEPTIDE - Abnormal; Notable for the following:    Pro B Natriuretic peptide (BNP) 1535.0 (*)    All other components within normal limits  POCT I-STAT TROPONIN I   Imaging Review Dg Chest Port 1 View  03/04/2013   CLINICAL DATA:  Shortness of breath.  EXAM: PORTABLE CHEST - 1 VIEW  COMPARISON:  01/08/2011  FINDINGS: Moderate and diffuse congestive heart failure is present. The heart is mildly enlarged. No large pleural effusions are identified. No focal airspace consolidation is seen.  IMPRESSION: Moderate congestive heart failure.   Electronically Signed   By: Irish Lack M.D.   On: 03/04/2013 21:52    EKG Interpretation    Date/Time:  Monday March 04 2013 21:47:20 EST Ventricular Rate:  106 PR Interval:    QRS Duration: 105 QT Interval:  363 QTC Calculation: 482 R Axis:   -2 Text Interpretation:  Atrial fibrillation ST elevation, consider anterior injury Borderline prolonged QT interval  Confirmed by Wilkie Aye  MD, Svea Pusch ([id number]) on 03/04/2013 10:27:54 PM           Medications  diltiazem (CARDIZEM) injection 10 mg (0 mg Intravenous Stopped 03/04/13 2140)  furosemide (LASIX) injection 20 mg (20 mg Intravenous Given 03/04/13 2151)  morphine 4 MG/ML injection 4 mg (2 mg Intravenous Given 03/04/13 2214)  diltiazem (CARDIZEM) 100 mg in dextrose 5 % 100 mL infusion (5 mg/hr Intravenous New Bag/Given 03/04/13 2237)  furosemide (LASIX) injection 20 mg (20 mg Intravenous Given 03/04/13 2208)   CRITICAL CARE Performed by: Ross Marcus, F   Total critical care time: 35 min  Critical care time was exclusive of separately billable procedures and treating other patients.  Critical care was necessary to treat or prevent imminent or life-threatening deterioration.  Critical care was time spent personally by me on the following activities: development of treatment plan with patient and/or surrogate as well as nursing, discussions with consultants, evaluation of patient's response to treatment, examination of patient, obtaining history from patient or surrogate, ordering and performing treatments and interventions, ordering and review of laboratory studies, ordering and review of radiographic studies, pulse oximetry and re-evaluation of patient's condition.  MDM   1. Atrial fibrillation    Patient presents in acute distress with shortness of breath and A. fib with RVR. Cardiology at bedside. ST elevation likely secondary to patient rate.  Patient given 10 mg  IV diltiazem the diltiazem drip started. Patient was also given 40 mg IV Lasix. X-ray shows evidence of CHF.  Initial EKG with a rate of 160, subsequent EKG with a rate of 106. Troponin is negative. Creatinine is 2.0. BNP is 1535.  Patient will be admitted to the cardiac ICU for further management of his acute A. fib with RVR.     Shon Baton, MD 03/04/13 [phone]

## 2013-03-04 NOTE — H&P (Signed)
History and Physical   Patient ID: MANDEL SEIDEN MRN: [id number], DOB/AGE: 08-18-1950   Admit date: 03/04/2013 Date of Consult: 03/04/2013   Primary Physician: Garlan Fillers, MD Primary Cardiologist: Dr. Johney Frame, [facility]  HPI: [name] [name] is a 62 y.o. male with PMHx of AF/AFL s/p ablation x 2 (most recently 01/22/13 by Dr. Johney Frame) due to ongoing recurrent symptomatic AF/AFL marked by weakness, dyspnea and lightheadedness; patient has failed prior DCCV's and AAD therapies.  He is currently maintained on Flecainide BID and Xarelto for primary stroke prevention in AF/AFL.  Additional PMHx of NICMO/HFrEF (previously 35%) and thought to be tachycardia mediated but recent TEE from October 2014 prior to PVI showed LVEF=60-65%.  Nuclear stress in 2012 showed no inducible ischemia.  Since his PVI last month, he was in somewhat stable health with some ongoing recurrences of AFib according to his wife.  He states that he's been fully compliant with all of his medications.  Last night around 1AM (early Monday morning) he woke up from sleep due to severe dyspnea.  He eventually settled back down but today he reported to an Urgent Care facility on [address] due to an acute dyspnea episode and there they performed an ECG showing rapid AFib and concern for anterior STE; thus he was emergently transported to [facility] ED as a code STEMI.  Upon arrival to the ED his ECG was reviewed along with his symptoms.  He had no chest pain; but did have significant dyspnea and moderate respiratory distress.  His ECG showed an incomplete LBBB pattern in the setting of rapid AF 120-130 bpm and mild STE in V1+V2 but no reciprocal changes.  CXR showed B/L pulmonary edema.  Code STEMI was cancelled and decision was made to evaluate patient for symptomatic AF with RVR, flash pulmonary edema/decompensated CHF.  Initial troponin was <0.30 and Pro-BNP was elevated at 1,535 up from historical levels of 200-900 in 2012.  CMP  additionally showed Creat=2.0 elevated from baseline of 1.5-1.7 range.     Problem List: Past Medical History  Diagnosis Date  . Hypertension   . Persistent atrial fibrillation     a. failed DCCV and multiple anti-arrhythmic drugs;   b. s/p  PVI isolation with ablation of AFib 8/13; c. repeat PVI 01/2013  . Renal calculi   . Chronic systolic CHF (congestive heart failure)     LVEF  previously 35% felt to be due tachycardia  . Diabetes mellitus   . Obesities, morbid   . Hypothyroid   . Gastric ulcer     s/p prior surgery  . Diverticulosis   . Erectile dysfunction   . Obstructive sleep apnea     noncompliant with CPAP  . Atrial flutter     a. s/p RFCA 8/13  . CKD (chronic kidney disease)     hx of a/c renal failure during episode of diverticulitis 9/13 in [place], [place]    Past Surgical History  Procedure Laterality Date  . Wrist sx  1980    right  . Knee sx  1985    both  . Benign stomach tumor removal  2000  . Thyroidectomy  2009    cysts removal   . Laparoscopic retropubic prostatectomy  02/2007    hx prostate cancer  . Kidney stone removal      multiple  . Tee without cardioversion  08/25/2011    Procedure: TRANSESOPHAGEAL ECHOCARDIOGRAM (TEE);  Surgeon: Corky Crafts, MD;  Location: [facility] ENDOSCOPY;  Service: Cardiovascular;  Laterality: N/A;  . Cardioversion  08/25/2011    Procedure: CARDIOVERSION;  Surgeon: Corky Crafts, MD;  Location: [facility] ENDOSCOPY;  Service: Cardiovascular;  Laterality: N/A;  . Tee without cardioversion  11/09/2011    Procedure: TRANSESOPHAGEAL ECHOCARDIOGRAM (TEE);  Surgeon: Corky Crafts, MD;  Location: [facility] ENDOSCOPY;  Service: Cardiovascular;  Laterality: N/A;  h/p in file drawer  . Atrial fibrillation ablatoin  12/06/11; 02/05/2013    Afib and atrial flutter ablation by Dr Johney Frame; repeat PVI by Dr Johney Frame 02/05/2013  . Tee without cardioversion N/A 02/05/2013    Procedure: TRANSESOPHAGEAL ECHOCARDIOGRAM (TEE);  Surgeon: Donato Schultz, MD;  Location: [facility] ENDOSCOPY;  Service: Cardiovascular;  Laterality: N/A;     Allergies:  Allergies  Allergen Reactions  . Phenergan [Promethazine Hcl] Itching    Hallucination   . Aspirin     Hx of ulcer   . Bystolic [Nebivolol Hcl]     Bradycardia   . Calcium Channel Blockers     LE edema  . Prednisone     Hx of ulcer  . Nsaids     Hx ulcer    Home Medications: Prior to Admission medications   Medication Sig Start Date End Date Taking? Authorizing Provider  carvedilol (COREG) 25 MG tablet Take 25 mg by mouth 2 (two) times daily with a meal.    Historical Provider, MD  Cholecalciferol (VITAMIN D) 2000 UNITS CAPS Take 2,000 Units by mouth daily.    Historical Provider, MD  ferrous sulfate 325 (65 FE) MG tablet Take 1 tablet (325 mg total) by mouth 3 (three) times daily with meals. 02/01/13   Hillis Range, MD  flecainide (TAMBOCOR) 100 MG tablet Take 1 tablet (100 mg total) by mouth 2 (two) times daily. 01/30/13   Beatrice Lecher, PA-C  GLUCOSAMINE PO Take 1,000 mg by mouth.    Historical Provider, MD  imipramine (TOFRANIL) 25 MG tablet Take 50 mg by mouth at bedtime.     Historical Provider, MD  levothyroxine (SYNTHROID, LEVOTHROID) 200 MCG tablet Take 200 mcg by mouth daily.    Historical Provider, MD  Multiple Vitamins-Minerals (MENS 50+ MULTI VITAMIN/MIN PO) Take 1 tablet by mouth daily.    Historical Provider, MD  Omega-3 Fatty Acids (FISH OIL) 1000 MG CAPS Take 1,000 capsules by mouth daily.     Historical Provider, MD  pantoprazole (PROTONIX) 40 MG tablet Take 40 mg by mouth daily.     Historical Provider, MD  polycarbophil (FIBERCON) 625 MG tablet Take 1,250 mg by mouth daily.    Historical Provider, MD  potassium chloride SA (K-DUR,KLOR-CON) 20 MEQ tablet TAKE 2 TABS IN THE AM AND TAKE 1 TAB IN THE PM 01/31/13   Everette Rank, MD  pravastatin (PRAVACHOL) 20 MG tablet Take 10 mg by mouth at bedtime.     Historical Provider, MD  Rivaroxaban (XARELTO) 15 MG TABS  tablet Take 15 mg by mouth daily.    Historical Provider, MD    Inpatient Medications:  .  morphine injection  4 mg Intravenous Once    (Not in a hospital admission)  Family History  Problem Relation Age of Onset  . Emphysema Mother   . Lung cancer Father   . Diabetes Brother   . Colon cancer Neg Hx      History   Social History  . Marital Status: Married    Spouse Name: N/A    Number of Children: N/A  . Years of Education: N/A   Occupational History  .  Not on file.   Social History Main Topics  . Smoking status: Never Smoker   . Smokeless tobacco: Not on file  . Alcohol Use: No  . Drug Use: No  . Sexual Activity: Yes   Other Topics Concern  . Not on file   Social History Narrative   Lives in [place].  Works as a Merchandiser, retail at [employer]     Review of Systems: All other systems reviewed and are otherwise negative except as noted above.  Physical Exam: Blood pressure 140/82, pulse 131, temperature 98.4 F (36.9 C), temperature source Oral, resp. rate 29, SpO2 100.00%.  General: In moderate respiratory distress, dyspneic, A&Ox3. Head: Normocephalic, atraumatic, sclera non-icteric, no xanthomas, nares are without discharge.  Neck: Negative for carotid bruits. JVP not elevated. Lungs: Labored breathing, B/L crackles, no wheezes/ronchi Heart: RRR with S1 S2. No murmurs, rubs, or gallops appreciated. Abdomen: Soft, obese, non-tender, non-distended with normoactive bowel sounds. No hepatomegaly. No rebound/guarding. No obvious abdominal masses. Msk:  Strength and tone appears normal for age. Extremities: No clubbing, cyanosis or edema.  Distal pedal pulses are 2+ and equal bilaterally. Neuro: Alert and oriented X 3. Moves all extremities spontaneously. Psych:  Responds to questions appropriately with a normal affect.  Labs: No results found for this basename: WBC, HGB, HCT, MCV, PLT,  in the last 72 hours No results found for this basename:  VITAMINB12, FOLATE, FERRITIN, TIBC, IRON, RETICCTPCT,  in the last 72 hours No results found for this basename: DDIMER,  in the last 72 hours No results found for this basename: NA, K, CL, CO2, BUN, CREATININE, CALCIUM, LABALBU, PROT, BILITOT, ALKPHOS, ALT, AST, AMYLASE, LIPASE, GLUCOSE,  in the last 168 hours No results found for this basename: HGBA1C,  in the last 72 hours No results found for this basename: CKTOTAL, CKMB, CKMBINDEX, TROPONINI,  in the last 72 hours No components found with this basename: POCBNP,  No results found for this basename: CHOL, HDL, LDLCALC, TRIG, CHOLHDL, LDLDIRECT,  in the last 72 hours No results found for this basename: TSH, T4TOTAL, FREET3, T3FREE, THYROIDAB,  in the last 72 hours  Radiology/Studies: Dg Chest Port 1 View  03/04/2013   CLINICAL DATA:  Shortness of breath.  EXAM: PORTABLE CHEST - 1 VIEW  COMPARISON:  01/08/2011  FINDINGS: Moderate and diffuse congestive heart failure is present. The heart is mildly enlarged. No large pleural effusions are identified. No focal airspace consolidation is seen.  IMPRESSION: Moderate congestive heart failure.   Electronically Signed   By: Irish Lack M.D.   On: 03/04/2013 21:52    03/04/13 Serial 12-lead ECG's:  AF with RVR (rates ranging up to 150's bpm), incomplete LBBB, anterior STE which may suggest injury versus related to LBBB pattern.  No reciprocal ST changes, however.  ASSESSMENT:  62 yo WM with PMHx of AF/AFL s/p ablation x 2 (most recently 01/22/13 by Dr. Johney Frame) due to ongoing recurrent symptomatic AF/AFL marked by weakness, dyspnea and lightheadedness; patient has failed prior DCCV's and AAD therapies.  He is currently maintained on Flecainide BID and Xarelto for primary stroke prevention in AF/AFL.  Additional PMHx of CKD (baseline Creat 1.5-1.7), NICMO/HFrEF (previously 35%) and thought to be tachycardia mediated but recent TEE from October 2014 prior to PVI showed LVEF=60-65%.  Nuclear stress in 2012  showed no inducible ischemia.  He now presents with signs/symptoms concerning for symptomatic rapid AF with decompensated diastolic (possibly combined) CHF exacerbation.  PLAN:  1-Admit to CCU level of care; code  STEMI was cancelled after discussion with Dr. Excell Seltzer. 2-Acute episodic Dyspnea:  Likely related to symptomatic AF with RVR and flash pulmonary edema/CHF exacerbation.  Alternative DDx include acute PE (which is much less likely given that patient is on NOAC with Xarelto), ACS/NSTEMI (but patient without chest pain, symptoms on/off since last 12 hours and initial Tn <0.30), Pericardial Effusion/Tamponade (less likely given no evidence on CXR, no hypotension, no clinical scenario/syndrome to explain etiology of new effusion/tamponade), COPD/asthma exacerbation (unlikely as no history of any such chronic lung disease), PNA/viral respiratory infection (unlikely as symptom onset fairly episodic and acute, no prodromal symptoms, no fevers/chills/muscle aches), pneumothorax (unlikely as no evidence on CXR),anxiety attack (possible, but would be diagnosis of exclusion in current clinical setting).  We will therefore manage as symptomatic AF and decompensated CHF.  We will closely monitor for possibility of ACS/NSTEMI.   -IV Diuresis with Lasix; strict I&O's, follow electrolytes closely.  -2D surface Echocardiogram to be performed in AM; will do limited echo tonight if hemodynamic instability/change in symptoms.  -AF rate control with IV Diltiazem gtt (titrated), D/C Coreg for time being given acutely fluid overloaded state and to avoid bradycardia.  -Anticoagulation to continue with Xarelto (from AF standpoint) thus will NOT start ACS protocol Heparin infusion.  -Discontinue Flecainide as it is failing to maintain SR and given concern for evolving structural HD if EF once again reduced.  -Trend cardiac biomarkers every 6 hours.  -Consider urgent cardiac catheterization overnight if change in symptoms,  ECG, rising troponin or hemodynamic/electric instability.  -Will keep NPO after midnight incase of further testing/procedure in AM.  3-AoCRF:  Likely due to decompensated CHF.  Hold nephrotoxic agents but continue IV diuresis as outlined above.  Monitor electrolytes and UOP closely. 4-Metabolic Acidosis with AG=14 (techincally wnl of 12-16 range):  Check ABG to evaluate acid-base disorder.  Check lactic acid level.  No evidence of hyperglycemia to support DKA/HHNK at this time. 5-Repeat HbA1C, TSH and FT4.  Continue synthroid home dose. 6-Routine daily labs.  Code Status:  FULL CODE.  Signed, Christie Nottingham, MD Cardiology [facility] 03/04/2013, 9:57 PM

## 2013-03-04 NOTE — ED Notes (Signed)
Pt denies CP at this time 

## 2013-03-04 NOTE — ED Notes (Signed)
Called pharmacy for diltiazem drip.

## 2013-03-04 NOTE — ED Notes (Signed)
Per EMS pt came from UC c/o SOB since 0100 this AM. Upon EMS arrival pt a fib on cardiac monitor with bundle branch block and ST elevation in leads V2 and V3. Pt placed on NRB, given 324 ASA and 1 SL nitroglycerin tablet. Pt denied pain with EMS. Pt c/o left shoulder pain upon arrival to room. HR 120-130.

## 2013-03-04 NOTE — Progress Notes (Signed)
[facility] [address] [place] [address] [phone]  Date:  03/04/2013   Name:  [name] [name]   DOB:  30-Nov-1950   MRN:  [id number]  PCP:  Garlan Fillers, MD    Chief Complaint: Shortness of Breath and Cough   History of Present Illness:  [name] [name] is a 62 y.o. very pleasant male patient who presents with the following:  Ill with shortness of breath since last night.  Unable to lay flat.  Nonproductive cough.  No chest pain.  History of intermittent atrial fib.  Had an ablation within the past few weeks.  Has intermittent shortness of breath that he attributes to the ablation.  Shortness of breath is much worse now since last night.  Some increase in peripheral edema.  No nausea or vomiting.    Patient Active Problem List   Diagnosis Date Noted  . Atrial fibrillation 10/24/2011  . Atrial flutter 10/24/2011  . Obstructive sleep apnea 10/24/2011  . Hypertension 10/24/2011  . Obesity 10/24/2011    Past Medical History  Diagnosis Date  . Hypertension   . Persistent atrial fibrillation     a. failed DCCV and multiple anti-arrhythmic drugs;   b. s/p  PVI isolation with ablation of AFib 8/13; c. repeat PVI 01/2013  . Renal calculi   . Chronic systolic CHF (congestive heart failure)     LVEF  previously 35% felt to be due tachycardia  . Diabetes mellitus   . Obesities, morbid   . Hypothyroid   . Gastric ulcer     s/p prior surgery  . Diverticulosis   . Erectile dysfunction   . Obstructive sleep apnea     noncompliant with CPAP  . Atrial flutter     a. s/p RFCA 8/13  . CKD (chronic kidney disease)     hx of a/c renal failure during episode of diverticulitis 9/13 in [place], [place]    Past Surgical History  Procedure Laterality Date  . Wrist sx  1980    right  . Knee sx  1985    both  . Benign stomach tumor removal  2000  . Thyroidectomy  2009    cysts removal   . Laparoscopic retropubic prostatectomy  02/2007    hx prostate  cancer  . Kidney stone removal      multiple  . Tee without cardioversion  08/25/2011    Procedure: TRANSESOPHAGEAL ECHOCARDIOGRAM (TEE);  Surgeon: Corky Crafts, MD;  Location: [facility] ENDOSCOPY;  Service: Cardiovascular;  Laterality: N/A;  . Cardioversion  08/25/2011    Procedure: CARDIOVERSION;  Surgeon: Corky Crafts, MD;  Location: [facility] ENDOSCOPY;  Service: Cardiovascular;  Laterality: N/A;  . Tee without cardioversion  11/09/2011    Procedure: TRANSESOPHAGEAL ECHOCARDIOGRAM (TEE);  Surgeon: Corky Crafts, MD;  Location: [facility] ENDOSCOPY;  Service: Cardiovascular;  Laterality: N/A;  h/p in file drawer  . Atrial fibrillation ablatoin  12/06/11; 02/05/2013    Afib and atrial flutter ablation by Dr Johney Frame; repeat PVI by Dr Johney Frame 02/05/2013  . Tee without cardioversion N/A 02/05/2013    Procedure: TRANSESOPHAGEAL ECHOCARDIOGRAM (TEE);  Surgeon: Donato Schultz, MD;  Location: [facility] ENDOSCOPY;  Service: Cardiovascular;  Laterality: N/A;    History  Substance Use Topics  . Smoking status: Never Smoker   . Smokeless tobacco: Not on file  . Alcohol Use: No    Family History  Problem Relation Age of Onset  . Emphysema Mother   . Lung cancer Father   .  Diabetes Brother   . Colon cancer Neg Hx     Allergies  Allergen Reactions  . Phenergan [Promethazine Hcl] Itching    Hallucination   . Aspirin     Hx of ulcer   . Bystolic [Nebivolol Hcl]     Bradycardia   . Calcium Channel Blockers     LE edema  . Prednisone     Hx of ulcer  . Nsaids     Hx ulcer    Medication list has been reviewed and updated.  Current Outpatient Prescriptions on File Prior to Visit  Medication Sig Dispense Refill  . carvedilol (COREG) 25 MG tablet Take 25 mg by mouth 2 (two) times daily with a meal.      . Cholecalciferol (VITAMIN D) 2000 UNITS CAPS Take 2,000 Units by mouth daily.      . ferrous sulfate 325 (65 FE) MG tablet Take 1 tablet (325 mg total) by mouth 3 (three) times daily with meals.   90 tablet  1  . flecainide (TAMBOCOR) 100 MG tablet Take 1 tablet (100 mg total) by mouth 2 (two) times daily.  60 tablet  11  . GLUCOSAMINE PO Take 1,000 mg by mouth.      Marland Kitchen imipramine (TOFRANIL) 25 MG tablet Take 50 mg by mouth at bedtime.       Marland Kitchen levothyroxine (SYNTHROID, LEVOTHROID) 200 MCG tablet Take 200 mcg by mouth daily.      . Multiple Vitamins-Minerals (MENS 50+ MULTI VITAMIN/MIN PO) Take 1 tablet by mouth daily.      . Omega-3 Fatty Acids (FISH OIL) 1000 MG CAPS Take 1,000 capsules by mouth daily.       . pantoprazole (PROTONIX) 40 MG tablet Take 40 mg by mouth daily.       . polycarbophil (FIBERCON) 625 MG tablet Take 1,250 mg by mouth daily.      . potassium chloride SA (K-DUR,KLOR-CON) 20 MEQ tablet TAKE 2 TABS IN THE AM AND TAKE 1 TAB IN THE PM  270 tablet  2  . pravastatin (PRAVACHOL) 20 MG tablet Take 10 mg by mouth at bedtime.       . Rivaroxaban (XARELTO) 15 MG TABS tablet Take 15 mg by mouth daily.       No current facility-administered medications on file prior to visit.    Review of Systems:  As per HPI, otherwise negative.    Physical Examination: Filed Vitals:   03/04/13 2023  BP: 130/90  Pulse: 102  Temp: 99.2 F (37.3 C)  Resp: 22   Filed Vitals:   03/04/13 2023  Height: 5' 6.75" (1.695 m)  Weight: 251 lb 12.8 oz (114.216 kg)   Body mass index is 39.75 kg/(m^2). Ideal Body Weight: Weight in (lb) to have BMI = 25: 158.1  GEN: obese, marked distress, short of breath, Non-toxic, A & O x 3 HEENT: Atraumatic, Normocephalic. Neck supple. No masses, No LAD. Ears and Nose: No external deformity. CV: irregular tachycardia, No M/G/R. No JVD. No thrill. No extra heart sounds. PULM: CTA B, no wheezes, crackles, rhonchi. No retractions. Moderate resp. Distress. tachypneic ABD: S, NT, ND, +BS. No rebound. No HSM. EXTR: No c/c  +1 edema NEURO Normal gait.  PSYCH: Normally interactive. Conversant.  anxious appearing.  Calm demeanor.    Assessment and  Plan: Atrial fib with rapid rate Acute respiratory distress.   Patient was placed on O2, EKG monitoring and 12 lead done IV  To ER via EMS   Signed,  Ellison Carwin, MD

## 2013-03-05 ENCOUNTER — Encounter (HOSPITAL_COMMUNITY): Payer: Self-pay | Admitting: Interventional Cardiology

## 2013-03-05 ENCOUNTER — Encounter (HOSPITAL_COMMUNITY): Payer: Managed Care, Other (non HMO) | Admitting: Anesthesiology

## 2013-03-05 ENCOUNTER — Other Ambulatory Visit: Payer: Self-pay | Admitting: Gastroenterology

## 2013-03-05 ENCOUNTER — Encounter (HOSPITAL_COMMUNITY)
Admission: EM | Disposition: A | Discharge status: Home / Self Care | Payer: Self-pay | Source: Home / Self Care | Attending: Interventional Cardiology

## 2013-03-05 ENCOUNTER — Inpatient Hospital Stay (HOSPITAL_COMMUNITY): Payer: Managed Care, Other (non HMO) | Admitting: Anesthesiology

## 2013-03-05 ENCOUNTER — Ambulatory Visit: Payer: Managed Care, Other (non HMO) | Admitting: Interventional Cardiology

## 2013-03-05 DIAGNOSIS — G4733 Obstructive sleep apnea (adult) (pediatric): Secondary | ICD-10-CM

## 2013-03-05 DIAGNOSIS — I319 Disease of pericardium, unspecified: Secondary | ICD-10-CM

## 2013-03-05 DIAGNOSIS — I10 Essential (primary) hypertension: Secondary | ICD-10-CM

## 2013-03-05 DIAGNOSIS — I4891 Unspecified atrial fibrillation: Principal | ICD-10-CM

## 2013-03-05 DIAGNOSIS — I509 Heart failure, unspecified: Secondary | ICD-10-CM

## 2013-03-05 DIAGNOSIS — I5032 Chronic diastolic (congestive) heart failure: Secondary | ICD-10-CM | POA: Insufficient documentation

## 2013-03-05 HISTORY — PX: CARDIOVERSION: SHX1299

## 2013-03-05 LAB — BASIC METABOLIC PANEL
BUN: 25 mg/dL — ABNORMAL HIGH (ref 6–23)
CO2: 21 mEq/L (ref 19–32)
GFR calc Af Amer: 48 mL/min — ABNORMAL LOW (ref >90)
GFR calc non Af Amer: 41 mL/min — ABNORMAL LOW (ref >90)
Glucose, Bld: 164 mg/dL — ABNORMAL HIGH (ref 70–99)
Potassium: 3.3 mEq/L — ABNORMAL LOW (ref 3.5–5.1)
Sodium: 140 mEq/L (ref 135–145)

## 2013-03-05 LAB — COMPREHENSIVE METABOLIC PANEL
ALT: 15 U/L (ref 0–53)
AST: 15 U/L (ref 0–37)
CO2: 20 mEq/L (ref 19–32)
Calcium: 8.8 mg/dL (ref 8.4–10.5)
Chloride: 103 mEq/L (ref 96–112)
Creatinine, Ser: 1.87 mg/dL — ABNORMAL HIGH (ref 0.50–1.35)
GFR calc Af Amer: 43 mL/min — ABNORMAL LOW (ref >90)
GFR calc non Af Amer: 37 mL/min — ABNORMAL LOW (ref >90)
Sodium: 139 mEq/L (ref 135–145)
Total Bilirubin: 0.5 mg/dL (ref 0.3–1.2)
Total Protein: 6.3 g/dL (ref 6.0–8.3)

## 2013-03-05 LAB — BLOOD GAS, ARTERIAL
Acid-base deficit: 0.8 mmol/L (ref 0.0–2.0)
O2 Saturation: 98.6 %
TCO2: 23 mmol/L (ref 0–100)
pCO2 arterial: 28.8 mmHg — ABNORMAL LOW (ref 35.0–45.0)

## 2013-03-05 LAB — CBC
HCT: 34.3 % — ABNORMAL LOW (ref 39.0–52.0)
MCH: 26.9 pg (ref 26.0–34.0)
MCHC: 32.4 g/dL (ref 30.0–36.0)
MCV: 83.3 fL (ref 78.0–100.0)
Platelets: 192 10*3/uL (ref 150–400)
RBC: 4.12 MIL/uL — ABNORMAL LOW (ref 4.22–5.81)
RDW: 21.7 % — ABNORMAL HIGH (ref 11.5–15.5)
WBC: 9.2 10*3/uL (ref 4.0–10.5)

## 2013-03-05 LAB — TROPONIN I
Troponin I: <0.3 ng/mL (ref <0.30)
Troponin I: <0.3 ng/mL (ref <0.30)
Troponin I: <0.3 ng/mL (ref <0.30)

## 2013-03-05 LAB — CK TOTAL AND CKMB (NOT AT ARMC)
CK, MB: 2.9 ng/mL (ref 0.3–4.0)
CK, MB: 2.8 ng/mL (ref 0.3–4.0)
Relative Index: 1.8 (ref 0.0–2.5)
Relative Index: 2 (ref 0.0–2.5)
Relative Index: 1.1 (ref 0.0–2.5)
Total CK: 148 U/L (ref 7–232)
Total CK: 256 U/L — ABNORMAL HIGH (ref 7–232)

## 2013-03-05 LAB — TSH: TSH: 1.859 u[IU]/mL (ref 0.350–4.500)

## 2013-03-05 LAB — T4, FREE: Free T4: 1.28 ng/dL (ref 0.80–1.80)

## 2013-03-05 SURGERY — CARDIOVERSION
Anesthesia: Monitor Anesthesia Care

## 2013-03-05 MED ORDER — BIOTENE DRY MOUTH MT LIQD
15.0000 mL | Freq: Two times a day (BID) | OROMUCOSAL | Status: DC
  Administered 2013-03-05 – 2013-03-06 (×3): 15 mL via OROMUCOSAL

## 2013-03-05 MED ORDER — CARVEDILOL 25 MG PO TABS
25.0000 mg | ORAL_TABLET | Freq: Two times a day (BID) | ORAL | Status: DC
  Filled 2013-03-05: qty 1

## 2013-03-05 MED ORDER — ONDANSETRON HCL 4 MG PO TABS: 4.0000 mg | ORAL_TABLET | ORAL | Status: DC | PRN

## 2013-03-05 MED ORDER — POTASSIUM CHLORIDE CRYS ER 20 MEQ PO TBCR
40.0000 meq | EXTENDED_RELEASE_TABLET | Freq: Every day | ORAL | Status: DC
  Administered 2013-03-05 – 2013-03-06 (×2): 40 meq via ORAL
  Filled 2013-03-05 (×2): qty 2

## 2013-03-05 MED ORDER — FUROSEMIDE 10 MG/ML IJ SOLN
20.0000 mg | Freq: Two times a day (BID) | INTRAMUSCULAR | Status: DC
  Administered 2013-03-05 – 2013-03-06 (×3): 20 mg via INTRAVENOUS
  Filled 2013-03-05 (×5): qty 2

## 2013-03-05 MED ORDER — SODIUM CHLORIDE 0.9 % IV SOLN: 250.0000 mL | INTRAVENOUS | Status: DC

## 2013-03-05 MED ORDER — AMIODARONE HCL 200 MG PO TABS
400.0000 mg | ORAL_TABLET | Freq: Two times a day (BID) | ORAL | Status: DC
  Administered 2013-03-05 – 2013-03-06 (×3): 400 mg via ORAL
  Filled 2013-03-05 (×4): qty 2

## 2013-03-05 MED ORDER — IMIPRAMINE HCL 50 MG PO TABS
50.0000 mg | ORAL_TABLET | Freq: Every day | ORAL | Status: DC
Start: 2013-03-05 — End: 2013-03-06
  Administered 2013-03-05 (×2): 50 mg via ORAL
  Filled 2013-03-05 (×3): qty 1

## 2013-03-05 MED ORDER — SODIUM CHLORIDE 0.9 % IJ SOLN: 3.0000 mL | INTRAMUSCULAR | Status: DC | PRN

## 2013-03-05 MED ORDER — SIMVASTATIN 5 MG PO TABS
5.0000 mg | ORAL_TABLET | Freq: Every day | ORAL | Status: DC
  Administered 2013-03-05: 5 mg via ORAL
  Filled 2013-03-05 (×2): qty 1

## 2013-03-05 MED ORDER — LIDOCAINE HCL (CARDIAC) 20 MG/ML IV SOLN
INTRAVENOUS | Status: DC | PRN
  Administered 2013-03-05: 100 mg via INTRAVENOUS

## 2013-03-05 MED ORDER — PANTOPRAZOLE SODIUM 40 MG PO TBEC
40.0000 mg | DELAYED_RELEASE_TABLET | Freq: Every day | ORAL | Status: DC
  Administered 2013-03-05 – 2013-03-06 (×2): 40 mg via ORAL
  Filled 2013-03-05 (×2): qty 1

## 2013-03-05 MED ORDER — ONDANSETRON HCL 4 MG/2ML IJ SOLN: 4.0000 mg | INTRAMUSCULAR | Status: DC | PRN

## 2013-03-05 MED ORDER — DILTIAZEM HCL 100 MG IV SOLR
5.0000 mg/h | INTRAVENOUS | Status: DC
  Administered 2013-03-05: 10 mg/h via INTRAVENOUS
  Filled 2013-03-05 (×2): qty 100

## 2013-03-05 MED ORDER — PROPOFOL 10 MG/ML IV BOLUS
INTRAVENOUS | Status: DC | PRN
  Administered 2013-03-05: 100 mg via INTRAVENOUS

## 2013-03-05 MED ORDER — SODIUM CHLORIDE 0.9 % IJ SOLN
3.0000 mL | Freq: Two times a day (BID) | INTRAMUSCULAR | Status: DC
  Administered 2013-03-05 – 2013-03-06 (×2): 3 mL via INTRAVENOUS

## 2013-03-05 MED ORDER — ACETAMINOPHEN 325 MG PO TABS: 650.0000 mg | ORAL_TABLET | ORAL | Status: DC | PRN

## 2013-03-05 MED ORDER — ACETAMINOPHEN 650 MG RE SUPP: 650.0000 mg | RECTAL | Status: DC | PRN

## 2013-03-05 MED ORDER — SODIUM CHLORIDE 0.9 % IV SOLN: INTRAVENOUS | Status: DC

## 2013-03-05 MED ORDER — LEVOTHYROXINE SODIUM 200 MCG PO TABS
200.0000 ug | ORAL_TABLET | Freq: Every day | ORAL | Status: DC
  Administered 2013-03-05 – 2013-03-06 (×2): 200 ug via ORAL
  Filled 2013-03-05 (×3): qty 1

## 2013-03-05 MED ORDER — POTASSIUM CHLORIDE ER 10 MEQ PO TBCR
40.0000 meq | EXTENDED_RELEASE_TABLET | Freq: Once | ORAL | Status: AC
  Administered 2013-03-05: 40 meq via ORAL
  Filled 2013-03-05: qty 4

## 2013-03-05 MED ORDER — RIVAROXABAN 15 MG PO TABS
15.0000 mg | ORAL_TABLET | Freq: Every day | ORAL | Status: DC
  Administered 2013-03-05: 15 mg via ORAL
  Filled 2013-03-05 (×2): qty 1

## 2013-03-05 NOTE — Anesthesia Postprocedure Evaluation (Signed)
Anesthesia Post Note  Patient: [name] [name]  Procedure(s) Performed: Procedure(s) (LRB): CARDIOVERSION (N/A)  Anesthesia type: general  Patient location: PACU  Post pain: Pain level controlled  Post assessment: Patient's Cardiovascular Status Stable  Last Vitals:  Filed Vitals:   03/05/13 1200  BP: 123/73  Pulse: 60  Temp:   Resp: 20    Post vital signs: Reviewed and stable  Level of consciousness: sedated  Complications: No apparent anesthesia complications

## 2013-03-05 NOTE — Preoperative (Signed)
Beta Blockers   Reason not to administer Beta Blockers:Not Applicable 

## 2013-03-05 NOTE — Anesthesia Procedure Notes (Signed)
Procedure Name: MAC Date/Time: 03/05/2013 11:07 AM Performed by: Marena Chancy Pre-anesthesia Checklist: Patient identified, Emergency Drugs available, Suction available, Patient being monitored and Timeout performed Patient Re-evaluated:Patient Re-evaluated prior to inductionOxygen Delivery Method: Simple face mask Preoxygenation: Pre-oxygenation with 100% oxygen Intubation Type: IV induction

## 2013-03-05 NOTE — CV Procedure (Signed)
EP procedure Note   Pre procedure Diagnosis:  Persistent Atrial fibrillation Post procedure Diagnosis:  Same  Procedures:  Electrical cardioversion  Description:  Informed, written consent was obtained for cardioversion.  Adequate IV acces and airway support were assured.  The patient was adequately sedated with intravenous propofol as outlined in the anesthesia report.  The patient presented today in atrial fibrillation.  He was successfully cardioverted to sinus rhythm with a single synchronized biphasic 200J shock delivered with cardioversion electrodes placed in the anterior/posterior configuration.  He remains in sinus rhythm thereafter.  There were no early apparent complications.  Conclusions:  1.  Successful cardioversion of afib to sinus rhythm 2.  No early apparent complications.   Shavone Nevers,MD 11:11 AM 03/05/2013

## 2013-03-05 NOTE — Progress Notes (Signed)
Pt was cardioverted with Dr. Johney Frame at bedside.  Anesthesia administered medications and supported airway. A timeout was performed prior to procedure - all present were in agreeance that this was the correct patient and procedure.  Pt was defib. One time and converted into a NSR.  Pt awoken from the cardiversion shortly after.  He was able to move all limbs purposely and responded to all questions appropriately.  Pt tolerated procedure well and amiodarone is to be initiated.

## 2013-03-05 NOTE — Progress Notes (Signed)
Called report to [facility] on 2W.  All VS are WNL.  Pt remains in NSR post cardioversion at 1107.  Pt has received his first dose of amiodarone.  Wife has been advised of transfer and new room #.

## 2013-03-05 NOTE — Anesthesia Preprocedure Evaluation (Addendum)
Anesthesia Evaluation  Patient identified by MRN, date of birth, ID band Patient awake    Reviewed: Allergy & Precautions, H&P , NPO status , Patient's Chart, lab work & pertinent test results  Airway Mallampati: II TM Distance: >3 FB Neck ROM: Full    Dental   Pulmonary sleep apnea ,          Cardiovascular hypertension, +CHF + dysrhythmias Atrial Fibrillation     Neuro/Psych    GI/Hepatic   Endo/Other  diabetesHypothyroidism   Renal/GU      Musculoskeletal   Abdominal   Peds  Hematology   Anesthesia Other Findings   Reproductive/Obstetrics                          Anesthesia Physical Anesthesia Plan  ASA: III  Anesthesia Plan: General   Post-op Pain Management:    Induction: Intravenous  Airway Management Planned: Mask  Additional Equipment:   Intra-op Plan:   Post-operative Plan:   Informed Consent: I have reviewed the patients History and Physical, chart, labs and discussed the procedure including the risks, benefits and alternatives for the proposed anesthesia with the patient or authorized representative who has indicated his/her understanding and acceptance.   Dental advisory given  Plan Discussed with: CRNA and Surgeon  Anesthesia Plan Comments:        Anesthesia Quick Evaluation

## 2013-03-05 NOTE — Progress Notes (Signed)
  Echocardiogram 2D Echocardiogram has been performed.  [name] [name] FRANCES 03/05/2013, 12:20 PM

## 2013-03-05 NOTE — Progress Notes (Signed)
Received referral from patient's nurse to check in on patient before surgery/procedure. Chaplain offered emotional/spiritual support and prayer. Patient said his corporate chaplain just left and he had no further emotional/spiritual needs right now. He was grateful for chaplain support.   Maurene Capes, [place] [phone]

## 2013-03-05 NOTE — Progress Notes (Signed)
SUBJECTIVE:  No CP.  SHOB better.   OBJECTIVE:   Vitals:   Filed Vitals:   03/05/13 0600 03/05/13 0700 03/05/13 0800 03/05/13 0801  BP: 99/50 114/65 111/68   Pulse: 81 75 62   Temp:    98 F (36.7 C)  TempSrc:    Oral  Resp: 21 21 19    Height:      Weight:      SpO2: 96% 97% 98%    I&O's:   Intake/Output Summary (Last 24 hours) at 03/05/13 0908 Last data filed at 03/05/13 0801  Gross per 24 hour  Intake    212 ml  Output   3625 ml  Net  -3413 ml   TELEMETRY: Reviewed telemetry pt in AFib, rate controlled:     PHYSICAL EXAM General: Well developed, well nourished, in no acute distress Head:    Normal cephalic and atramatic  Lungs:  No wheezing Heart:  Irregularly irregular; S1 S2 Pulses are 2+ & equal.             No JVD.   Abdomen: Bowel sounds are positive, abdomen soft and non-tender Msk:  Back normal, normal gait. Normal strength and tone for age. Extremities: No edema.  DP +1 Neuro: Alert and oriented X 3. Psych:  Normal affect, responds appropriately   LABS: Basic Metabolic Panel:  Recent Labs  16/10/96 0123 03/05/13 0330 03/05/13 0730  NA 139  --  140  K 3.0*  --  3.3*  CL 103  --  103  CO2 20  --  21  GLUCOSE 165*  --  164*  BUN 25*  --  25*  CREATININE 1.87*  --  1.71*  CALCIUM 8.8  --  8.7  MG  --  1.9  --    Liver Function Tests:  Recent Labs  03/05/13 0123  AST 15  ALT 15  ALKPHOS 94  BILITOT 0.5  PROT 6.3  ALBUMIN 3.3*   No results found for this basename: LIPASE, AMYLASE,  in the last 72 hours CBC:  Recent Labs  03/04/13 2146 03/05/13 0123  WBC 11.0* 9.2  HGB 10.5* 11.1*  HCT 31.7* 34.3*  MCV 81.3 83.3  PLT 198 192   Cardiac Enzymes:  Recent Labs  03/04/13 2310 03/05/13 0128 03/05/13 0730  CKTOTAL  --  141 148  CKMB  --  2.6 2.9  TROPONINI <0.30 <0.30 <0.30   BNP: No components found with this basename: POCBNP,  D-Dimer: No results found for this basename: DDIMER,  in the last 72 hours Hemoglobin A1C: No  results found for this basename: HGBA1C,  in the last 72 hours Fasting Lipid Panel: No results found for this basename: CHOL, HDL, LDLCALC, TRIG, CHOLHDL, LDLDIRECT,  in the last 72 hours Thyroid Function Tests: No results found for this basename: TSH, T4TOTAL, FREET3, T3FREE, THYROIDAB,  in the last 72 hours Anemia Panel: No results found for this basename: VITAMINB12, FOLATE, FERRITIN, TIBC, IRON, RETICCTPCT,  in the last 72 hours Coag Panel:   Lab Results  Component Value Date   INR 1.90* 12/06/2011   INR 0.95 01/08/2011   INR 1.14 02/16/2009    RADIOLOGY: Dg Chest Port 1 View  03/04/2013   CLINICAL DATA:  Shortness of breath.  EXAM: PORTABLE CHEST - 1 VIEW  COMPARISON:  01/08/2011  FINDINGS: Moderate and diffuse congestive heart failure is present. The heart is mildly enlarged. No large pleural effusions are identified. No focal airspace consolidation is seen.  IMPRESSION: Moderate congestive  heart failure.   Electronically Signed   By: Irish Lack M.D.   On: 03/04/2013 21:52      ASSESSMENT: AFib, Diastolic heart failure-acute, CKD, hypokalemia  PLAN:  IV Dilt for rate control.  Consult to Dr. Johney Frame.  ? AMio for long term NSR.  S/p ablation x2  COntinue lasix for now.  Follow cr. Known renal insufficiency in the past.  Supplement potassium    Stewart Sasaki S., MD  03/05/2013  9:08 AM

## 2013-03-05 NOTE — Consult Note (Signed)
ELECTROPHYSIOLOGY CONSULT NOTE    Patient ID: [name] [name] MRN: [id number], DOB/AGE: 62/23/1952 62 y.o.  Admit date: 03/04/2013 Date of Consult: 03/05/2013  Primary Physician: Garlan Fillers, MD Primary Cardiologist: Everette Rank, MD Electrophysiologist: Hillis Range, MD  Reason for Consultation: atrial fibrillation  HPI:  [name] [name] is a 62 year old male with a past medical history of persistent atrial fibrillation s/p PVI 11/2011 and then again 02/05/2013, hypertension, previous tachycardia mediated cardiomyopathy, diabetes, obesity, and hypothyroidism.  He has failed medical therapy with Flecainide and Multaq.  He did have good results with Amiodarone but this medication was discontinued due to concerns about long term usage.  He states that since his ablation 10-28, he has been mostly in atrial fibrillation.  However, he did not call the office to let us know about this.  He was doing well without chest pain or shortness of breath until yesterday when he developed orthopnea, lower extremity edema, palpitations, and significant dyspnea on exertion.  He was seen by his PCP who transported to [facility] by EMS for further evaluation.  On arrival, he was found to be in afib with RVR at a rate of 160.  He was placed on IV Diltiazem and diuresed with IV Lasix.  He is doing much better today. EP has been asked to evaluate for treatment options.   TEE prior to ablation 02-05-2013 demonstrated an EF of 60-65%, LA 5X5.  Lab work this admission is significant for BNP of 1000, negative cardiac enzymes, K 3.3, creat 1.7, Mg 1.9.    He denies nausea, vomiting, diarrhea, dizziness, pre-syncope, or syncope.  ROS is negative except as outlined above. He states he has been compliant since discharge with Flecainide bid and Xarelto 15mg  daily.  Wt Readings from Last 3 Encounters:  03/05/13 251 lb (113.853 kg)  03/04/13 251 lb 12.8 oz (114.216 kg)  02/05/13 238 lb 15.7 oz (108.4 kg)      Intake/Output  Summary (Last 24 hours) at 03/05/13 0944 Last data filed at 03/05/13 0801  Gross per 24 hour  Intake    212 ml  Output   3625 ml  Net  -3413 ml     Past Medical History  Diagnosis Date  . Hypertension   . Persistent atrial fibrillation     a. failed DCCV and multiple anti-arrhythmic drugs;   b. s/p  PVI isolation with ablation of AFib 8/13; c. repeat PVI 01/2013  . Renal calculi   . Chronic systolic CHF (congestive heart failure)     LVEF  previously 35% felt to be due tachycardia  . Diabetes mellitus   . Obesities, morbid   . Hypothyroid   . Gastric ulcer     s/p prior surgery  . Diverticulosis   . Erectile dysfunction   . Obstructive sleep apnea     noncompliant with CPAP  . Atrial flutter     a. s/p RFCA 8/13  . CKD (chronic kidney disease)     hx of a/c renal failure during episode of diverticulitis 9/13 in [place], [place]  . Acute diastolic heart failure      Surgical History:  Past Surgical History  Procedure Laterality Date  . Wrist sx  1980    right  . Knee sx  1985    both  . Benign stomach tumor removal  2000  . Thyroidectomy  2009    cysts removal   . Laparoscopic retropubic prostatectomy  02/2007    hx prostate cancer  . Kidney  stone removal      multiple  . Tee without cardioversion  08/25/2011    Procedure: TRANSESOPHAGEAL ECHOCARDIOGRAM (TEE);  Surgeon: Corky Crafts, MD;  Location: [facility] ENDOSCOPY;  Service: Cardiovascular;  Laterality: N/A;  . Cardioversion  08/25/2011    Procedure: CARDIOVERSION;  Surgeon: Corky Crafts, MD;  Location: [facility] ENDOSCOPY;  Service: Cardiovascular;  Laterality: N/A;  . Tee without cardioversion  11/09/2011    Procedure: TRANSESOPHAGEAL ECHOCARDIOGRAM (TEE);  Surgeon: Corky Crafts, MD;  Location: [facility] ENDOSCOPY;  Service: Cardiovascular;  Laterality: N/A;  h/p in file drawer  . Atrial fibrillation ablatoin  12/06/11; 02/05/2013    Afib and atrial flutter ablation by Dr Johney Frame; repeat PVI by Dr Johney Frame  02/05/2013  . Tee without cardioversion N/A 02/05/2013    Procedure: TRANSESOPHAGEAL ECHOCARDIOGRAM (TEE);  Surgeon: Donato Schultz, MD;  Location: [facility] ENDOSCOPY;  Service: Cardiovascular;  Laterality: N/A;     Prescriptions prior to admission  Medication Sig Dispense Refill  . carvedilol (COREG) 25 MG tablet Take 25 mg by mouth 2 (two) times daily with a meal.      . Cholecalciferol (VITAMIN D) 2000 UNITS CAPS Take 2,000 Units by mouth daily.      . ferrous sulfate 325 (65 FE) MG tablet Take 1 tablet (325 mg total) by mouth 3 (three) times daily with meals.  90 tablet  1  . flecainide (TAMBOCOR) 100 MG tablet Take 1 tablet (100 mg total) by mouth 2 (two) times daily.  60 tablet  11  . GLUCOSAMINE PO Take 1,000 mg by mouth.      Marland Kitchen imipramine (TOFRANIL) 25 MG tablet Take 50 mg by mouth at bedtime.       Marland Kitchen levothyroxine (SYNTHROID, LEVOTHROID) 200 MCG tablet Take 200 mcg by mouth daily.      . Multiple Vitamins-Minerals (MENS 50+ MULTI VITAMIN/MIN PO) Take 1 tablet by mouth daily.      . Omega-3 Fatty Acids (FISH OIL) 1000 MG CAPS Take 1,000 capsules by mouth daily.       . pantoprazole (PROTONIX) 40 MG tablet Take 40 mg by mouth daily.       . polycarbophil (FIBERCON) 625 MG tablet Take 1,250 mg by mouth daily.      . potassium chloride SA (K-DUR,KLOR-CON) 20 MEQ tablet Take 20-40 mEq by mouth 2 (two) times daily. 40 MeQ in the AM and 20 MeQ in the PM      . pravastatin (PRAVACHOL) 20 MG tablet Take 10 mg by mouth at bedtime.       . Rivaroxaban (XARELTO) 15 MG TABS tablet Take 15 mg by mouth daily.        Inpatient Medications:  . antiseptic oral rinse  15 mL Mouth Rinse BID  . furosemide  20 mg Intravenous Q12H  . imipramine  50 mg Oral QHS  . levothyroxine  200 mcg Oral QAC breakfast  . pantoprazole  40 mg Oral Daily  . Rivaroxaban  15 mg Oral Q supper  . simvastatin  5 mg Oral q1800  . sodium chloride  3 mL Intravenous Q12H    Allergies:  Allergies  Allergen Reactions  . Phenergan  [Promethazine Hcl] Itching    Hallucination   . Aspirin     Hx of ulcer   . Bystolic [Nebivolol Hcl]     Bradycardia   . Calcium Channel Blockers     LE edema  . Prednisone     Hx of ulcer  . Nsaids  Hx ulcer    History   Social History  . Marital Status: Married    Spouse Name: N/A    Number of Children: N/A  . Years of Education: N/A   Occupational History  . Not on file.   Social History Main Topics  . Smoking status: Never Smoker   . Smokeless tobacco: Not on file  . Alcohol Use: No  . Drug Use: No  . Sexual Activity: Yes   Other Topics Concern  . Not on file   Social History Narrative   Lives in [place].  Works as a Merchandiser, retail at [employer] History  Problem Relation Age of Onset  . Emphysema Mother   . Lung cancer Father   . Diabetes Brother   . Colon cancer Neg Hx     Physical Exam: Filed Vitals:   03/05/13 0800 03/05/13 0801 03/05/13 0900 03/05/13 1000  BP: 111/68  123/88 119/80  Pulse: 62  83 88  Temp:  98 F (36.7 C)    TempSrc:  Oral    Resp: 19  20 23   Height:      Weight:      SpO2: 98%  94% 95%    GEN- The patient is well appearing, alert and oriented x 3 today.   Head- normocephalic, atraumatic Eyes-  Sclera clear, conjunctiva pink Ears- hearing intact Oropharynx- clear Neck- supple, +JVD Lungs- Clear to ausculation bilaterally, normal work of breathing Heart- irregular rate and rhythm, decreased heart sounds GI- soft, NT, ND, + BS Extremities- no clubbing, cyanosis, or edema MS- no significant deformity or atrophy Skin- no rash or lesion Psych- euthymic mood, full affect Neuro- strength and sensation are intact  Labs:   Lab Results  Component Value Date   WBC 9.2 03/05/2013   HGB 11.1* 03/05/2013   HCT 34.3* 03/05/2013   MCV 83.3 03/05/2013   PLT 192 03/05/2013    Recent Labs Lab 03/05/13 0123 03/05/13 0730  NA 139 140  K 3.0* 3.3*  CL 103 103  CO2 20 21  BUN 25* 25*  CREATININE  1.87* 1.71*  CALCIUM 8.8 8.7  PROT 6.3  --   BILITOT 0.5  --   ALKPHOS 94  --   ALT 15  --   AST 15  --   GLUCOSE 165* 164*   Lab Results  Component Value Date   CKTOTAL 148 03/05/2013   CKMB 2.9 03/05/2013   TROPONINI <0.30 03/05/2013   Lab Results  Component Value Date   CHOL 214* 01/09/2011   Lab Results  Component Value Date   HDL 45 01/09/2011   Lab Results  Component Value Date   LDLCALC 115* 01/09/2011   Lab Results  Component Value Date   TRIG 271* 01/09/2011   Lab Results  Component Value Date   CHOLHDL 4.8 01/09/2011   No results found for this basename: LDLDIRECT    No results found for this basename: DDIMER     Radiology/Studies: Dg Chest Port 1 View 03/04/2013   CLINICAL DATA:  Shortness of breath.  EXAM: PORTABLE CHEST - 1 VIEW  COMPARISON:  01/08/2011  FINDINGS: Moderate and diffuse congestive heart failure is present. The heart is mildly enlarged. No large pleural effusions are identified. No focal airspace consolidation is seen.  IMPRESSION: Moderate congestive heart failure.   Electronically Signed   By: Irish Lack M.D.   On: 03/04/2013 21:52    EKG: atrial fibrillation with RVR, ventricular rate 160,  LBBB, QRS 122  TELEMETRY: atrial fibrillation with controlled ventricular response  Assessment and Plan:  1. afib The patient has had early recurrent afib post ablation (ERAF).  He remains within the 90 healing phase post ablation.  I will stop flecainide and initiate amiodarone therapy while her continues to recover from ablation.  Risks, benefits, and alternatives to amiodarone were discussed at length with the patient who wishes to take this medicine.  We also discussed risks, alternatives and benefits to cardioversion.  He reports that he has been compliant with his xarelto without interruption.  We will therefore proceed with cardioversion at this time.  2. Acute on chronic CHF Likely due to afib with RVR Will obtain sinus and then  diurese.  3. HTN Stable No change required today  4. OSA Compliance needs to be addressed

## 2013-03-05 NOTE — Transfer of Care (Signed)
Immediate Anesthesia Transfer of Care Note  Patient: [name] [name]  Procedure(s) Performed: Procedure(s) with comments: CARDIOVERSION (N/A) - BEDSIDE   Patient Location: Endoscopy Unit  Anesthesia Type:General  Level of Consciousness: awake, alert  and oriented  Airway & Oxygen Therapy: Patient Spontanous Breathing and Patient connected to nasal cannula oxygen  Post-op Assessment: Report given to PACU RN and Post -op Vital signs reviewed and stable  Post vital signs: Reviewed and stable  Complications: No apparent anesthesia complications

## 2013-03-06 ENCOUNTER — Other Ambulatory Visit: Payer: Self-pay | Admitting: Nurse Practitioner

## 2013-03-06 ENCOUNTER — Telehealth: Payer: Self-pay | Admitting: Interventional Cardiology

## 2013-03-06 ENCOUNTER — Encounter (HOSPITAL_COMMUNITY): Payer: Self-pay | Admitting: Nurse Practitioner

## 2013-03-06 DIAGNOSIS — N189 Chronic kidney disease, unspecified: Secondary | ICD-10-CM

## 2013-03-06 DIAGNOSIS — I5043 Acute on chronic combined systolic (congestive) and diastolic (congestive) heart failure: Secondary | ICD-10-CM

## 2013-03-06 DIAGNOSIS — E876 Hypokalemia: Secondary | ICD-10-CM

## 2013-03-06 DIAGNOSIS — N1831 Chronic kidney disease, stage 3a: Secondary | ICD-10-CM

## 2013-03-06 DIAGNOSIS — I5031 Acute diastolic (congestive) heart failure: Secondary | ICD-10-CM

## 2013-03-06 LAB — CK TOTAL AND CKMB (NOT AT ARMC)
CK, MB: 4.3 ng/mL — ABNORMAL HIGH (ref 0.3–4.0)
CK, MB: 4.6 ng/mL — ABNORMAL HIGH (ref 0.3–4.0)
Relative Index: 0.8 (ref 0.0–2.5)
Relative Index: 0.7 (ref 0.0–2.5)
Total CK: 532 U/L — ABNORMAL HIGH (ref 7–232)

## 2013-03-06 LAB — COMPREHENSIVE METABOLIC PANEL
ALT: 16 U/L (ref 0–53)
AST: 25 U/L (ref 0–37)
Albumin: 3.1 g/dL — ABNORMAL LOW (ref 3.5–5.2)
Alkaline Phosphatase: 94 U/L (ref 39–117)
Calcium: 8.4 mg/dL (ref 8.4–10.5)
Creatinine, Ser: 1.43 mg/dL — ABNORMAL HIGH (ref 0.50–1.35)
GFR calc Af Amer: 59 mL/min — ABNORMAL LOW (ref >90)
Potassium: 3.5 mEq/L (ref 3.5–5.1)
Sodium: 141 mEq/L (ref 135–145)
Total Protein: 6.2 g/dL (ref 6.0–8.3)

## 2013-03-06 LAB — CBC
Hemoglobin: 11.3 g/dL — ABNORMAL LOW (ref 13.0–17.0)
MCH: 26.9 pg (ref 26.0–34.0)
MCHC: 31.8 g/dL (ref 30.0–36.0)
Platelets: 187 10*3/uL (ref 150–400)
RDW: 22.1 % — ABNORMAL HIGH (ref 11.5–15.5)

## 2013-03-06 LAB — TROPONIN I
Troponin I: <0.3 ng/mL (ref <0.30)
Troponin I: <0.3 ng/mL (ref <0.30)

## 2013-03-06 MED ORDER — AMIODARONE HCL 200 MG PO TABS: 200.0000 mg | ORAL_TABLET | Freq: Two times a day (BID) | ORAL | Status: DC

## 2013-03-06 MED ORDER — FUROSEMIDE 40 MG PO TABS: 40.0000 mg | ORAL_TABLET | Freq: Every day | ORAL | Status: DC

## 2013-03-06 MED ORDER — AMIODARONE HCL 400 MG PO TABS: 400.0000 mg | ORAL_TABLET | Freq: Two times a day (BID) | ORAL | Status: DC

## 2013-03-06 NOTE — Progress Notes (Signed)
SUBJECTIVE: The patient is doing well today.  At this time, he denies chest pain, shortness of breath, or any new concerns.  He feels that his breathing is much improved.  He wants to go home.   CURRENT MEDICATIONS: . amiodarone  400 mg Oral BID  . antiseptic oral rinse  15 mL Mouth Rinse BID  . furosemide  20 mg Intravenous Q12H  . imipramine  50 mg Oral QHS  . levothyroxine  200 mcg Oral QAC breakfast  . pantoprazole  40 mg Oral Daily  . potassium chloride  40 mEq Oral Daily  . Rivaroxaban  15 mg Oral Q supper  . simvastatin  5 mg Oral q1800  . sodium chloride  3 mL Intravenous Q12H   . sodium chloride 10 mL/hr at 03/05/13 0030  . sodium chloride    . sodium chloride      OBJECTIVE: Physical Exam: Filed Vitals:   03/05/13 1600 03/05/13 1705 03/05/13 2022 03/06/13 0507  BP: 126/78  128/77 127/74  Pulse: 76  85 87  Temp:   98.6 F (37 C) 98.2 F (36.8 C)  TempSrc:   Oral Oral  Resp: 23 16 18 18   Height:      Weight:    241 lb 4.8 oz (109.453 kg)  SpO2: 95% 100% 98% 97%    Intake/Output Summary (Last 24 hours) at 03/06/13 0700 Last data filed at 03/06/13 7829  Gross per 24 hour  Intake    430 ml  Output   2050 ml  Net  -1620 ml   Wt Readings from Last 3 Encounters:  03/06/13 241 lb 4.8 oz (109.453 kg)  03/06/13 241 lb 4.8 oz (109.453 kg)  03/04/13 251 lb 12.8 oz (114.216 kg)    Telemetry reveals sinus rhythm, short run atrial tach last night  GEN- The patient is well appearing, alert and oriented x 3 today.   Head- normocephalic, atraumatic Eyes-  Sclera clear, conjunctiva pink Ears- hearing intact Oropharynx- clear Neck- supple,  Lungs- Clear to ausculation bilaterally, normal work of breathing Heart- Regular rate and rhythm, no murmurs, rubs or gallops, PMI not laterally displaced GI- soft, NT, ND, + BS Extremities- no clubbing, cyanosis, + edema Skin- no rash or lesion Psych- euthymic mood, full affect Neuro- strength and sensation are  intact  LABS: Basic Metabolic Panel:  Recent Labs  56/21/30 0330 03/05/13 0730 03/06/13 0418  NA  --  140 141  K  --  3.3* 3.5  CL  --  103 106  CO2  --  21 24  GLUCOSE  --  164* 150*  BUN  --  25* 24*  CREATININE  --  1.71* 1.43*  CALCIUM  --  8.7 8.4  MG 1.9  --   --    Liver Function Tests:  Recent Labs  03/05/13 0123 03/06/13 0418  AST 15 25  ALT 15 16  ALKPHOS 94 94  BILITOT 0.5 0.4  PROT 6.3 6.2  ALBUMIN 3.3* 3.1*   CBC:  Recent Labs  03/05/13 0123 03/06/13 0418  WBC 9.2 8.9  HGB 11.1* 11.3*  HCT 34.3* 35.5*  MCV 83.3 84.5  PLT 192 187   Cardiac Enzymes:  Recent Labs  03/05/13 0730 03/05/13 1830 03/06/13 0037  CKTOTAL 148 256* 532*  CKMB 2.9 2.8 4.3*  TROPONINI <0.30 <0.30 <0.30   Hemoglobin A1C:  Recent Labs  03/05/13 0730  HGBA1C 6.9*   Thyroid Function Tests:  Recent Labs  03/05/13 0730  TSH 1.859  RADIOLOGY: Dg Chest Port 1 View 03/04/2013   CLINICAL DATA:  Shortness of breath.  EXAM: PORTABLE CHEST - 1 VIEW  COMPARISON:  01/08/2011  FINDINGS: Moderate and diffuse congestive heart failure is present. The heart is mildly enlarged. No large pleural effusions are identified. No focal airspace consolidation is seen.  IMPRESSION: Moderate congestive heart failure.   Electronically Signed   By: Irish Lack M.D.   On: 03/04/2013 21:52   Echo is reviewed  ASSESSMENT AND PLAN:  Principal Problem:   CHF exacerbation Active Problems:   Atrial fibrillation with RVR   Flash pulmonary edema  1. Persistent afib Maintaining sinus rhythm Continue amiodarone 400mg  BID x 14 days then 200mg  BID x 4 weeks Continue xarelto  2. Acute on chronic diastolic CHF Discharge on lasix 40mg  daily Will need to increase oral potassium Salt restriction advised Follow-up for transition of care visit with either Dr Eldridge Dace or an extender within 1 week  3. CRI Stable No change required today  4. Hypokalemia Increase home potassium dose at  discharge while on lasix  DC to home today

## 2013-03-06 NOTE — Progress Notes (Signed)
Discharge Summary   Patient ID: [name] [name],  MRN: [id number], DOB/AGE: December 24, 1950 62 y.o.  Admit date: 03/04/2013 Discharge date: 03/06/2013  Primary Care Provider: Garlan Fillers Primary Cardiologist: Catalina Gravel  Primary Electrophysiologist:  J. Sharren Schnurr, MD   Discharge Diagnoses Principal Problem:   Atrial fibrillation with RVR  **s/p successful DCCV this admission.  *Amiodarone initiated this admission.  *Chronic Xarelto anticoagulation (renal dosing)  Active Problems:   Acute on chronic combined systolic and diastolic congestive heart failure, NYHA class 1  *Net negative diuresis of 5.2 Liters this admission with discharge weight of 241 lbs.   Hypertension   Obesity   CKD (chronic kidney disease), stage III   Obstructive sleep apnea  Allergies Allergies  Allergen Reactions  . Phenergan [Promethazine Hcl] Itching    Hallucination   . Aspirin     Hx of ulcer   . Bystolic [Nebivolol Hcl]     Bradycardia   . Calcium Channel Blockers     LE edema  . Prednisone     Hx of ulcer  . Nsaids     Hx ulcer   Procedures  2D Echocardiogram 11.25.2014  Study Conclusions  - Left ventricle: The cavity size was normal. Systolic   function was normal. The estimated ejection fraction was   in the range of 50% to 55%. Wall motion was normal; there   were no regional wall motion abnormalities. - Mitral valve: Mild to moderate regurgitation. - Left atrium: The atrium was mildly dilated. - Right ventricle: The cavity size was mildly dilated. Wall   thickness was normal. - Right atrium: The atrium was mildly dilated. - Pericardium, extracardiac: A small pericardial effusion   was identified posterior to the heart and along the left   ventricular free wall. The fluid had no internal   echoes.There was no evidence of hemodynamic compromise. _____________   DCCV 11.25.2014  Conclusions:   1.  Successful cardioversion of afib to sinus rhythm 2.  No early apparent  complications. _____________   History of Present Illness  62 y/o male with h/o atrial fibrillation and non-ischemic cardiomyopathy with EF previously as low as 35%, who has previously failed multiple antiarrhythmics and cardioversions.  He recently underwent atrial fibrillation ablation/pulmonary vein isolation in October of this year and initially did well with only brief paroxysms of recurrent A. fib. In the early morning hours of November 23, patient awoke from sleep with severe dyspnea. This persisted until later in the morning when he finally presented to urgent care where he was found to be in atrial fibrillation with a rapid ventricular response. There was also concern for anterior ST segment elevation. EMS was called and a code STEMI was activated. Patient was taken to the [place] where he was met by cardiology an ECG was reviewed. It was not felt to represent an acute MI in the code STEMI was called off. Chest x-ray showed bilateral pulmonary edema and initial troponin was normal.  He was admitted for further management of rapid atrial fibrillation along with acute on chronic combined CHF.  Hospital Course  Following admission, flecainide therapy was discontinued given its failure to maintain sinus rhythm. He was seen by electrophysiology and recommendation was made to initiate amiodarone therapy with a plan for cardioversion. A 2-D echocardiogram was carried out on November 25, revealing normal LV function. Successful cardioversion was then performed on the morning of the 25th as well. Post cardioversion, Mr. Nhan has maintained sinus rhythm. He has diuresis a net negative  of over 5 L, and he has had marked symptomatic improvement. We will discharge Mr. Ngu home today in good condition. He will continue on amiodarone 400 mg twice a day for the next 2 weeks at which time he will drop his dose to 200 mg twice a day x4 weeks, and then subsequently 200 mg daily. We have arranged for office  followup in one week. We have added oral Lasix therapy to his home regimen and he will continue on oral potassium replacement as well.  We will repeat a bmet when he is seen in clinic next week.  Discharge Vitals Blood pressure 141/81, pulse 71, temperature 98.2 F (36.8 C), temperature source Oral, resp. rate 18, height 5\' 8"  (1.727 m), weight 241 lb 4.8 oz (109.453 kg), SpO2 97.00%.  Filed Weights   03/05/13 0035 03/06/13 0507  Weight: 251 lb (113.853 kg) 241 lb 4.8 oz (109.453 kg)   Labs  CBC  Recent Labs  03/05/13 0123 03/06/13 0418  WBC 9.2 8.9  HGB 11.1* 11.3*  HCT 34.3* 35.5*  MCV 83.3 84.5  PLT 192 187   Basic Metabolic Panel  Recent Labs  03/05/13 0330 03/05/13 0730 03/06/13 0418  NA  --  140 141  K  --  3.3* 3.5  CL  --  103 106  CO2  --  21 24  GLUCOSE  --  164* 150*  BUN  --  25* 24*  CREATININE  --  1.71* 1.43*  CALCIUM  --  8.7 8.4  MG 1.9  --   --    Liver Function Tests  Recent Labs  03/05/13 0123 03/06/13 0418  AST 15 25  ALT 15 16  ALKPHOS 94 94  BILITOT 0.5 0.4  PROT 6.3 6.2  ALBUMIN 3.3* 3.1*   Cardiac Enzymes  Recent Labs  03/05/13 1830 03/06/13 0037 03/06/13 0804  CKTOTAL 256* 532* 647*  CKMB 2.8 4.3* 4.6*  TROPONINI <0.30 <0.30 <0.30   Hemoglobin A1C  Recent Labs  03/05/13 0730  HGBA1C 6.9*   Thyroid Function Tests  Recent Labs  03/05/13 0730  TSH 1.859   Disposition  Pt is being discharged home today in good condition.  Follow-up Plans & Appointments  Follow-up Information   Follow up with Hillis Range, MD On 05/08/2013. (10:00 AM)    Specialty:  Cardiology   Contact information:   [address] [place] [address] [phone]       Follow up with Tereso Newcomer, PA-C On 03/13/2013. (8:50 AM - Dr. Hoyle Barr PA.)    Specialty:  Physician Assistant   Contact information:   [address] [place] [address] [phone]       Follow up with Garlan Fillers, MD.  (as scheduled.)    Specialty:  Internal Medicine   Contact information:   [address] [facility] [place] [address] [phone]      Discharge Medications    Medication List    STOP taking these medications       flecainide 100 MG tablet  Commonly known as:  TAMBOCOR      TAKE these medications       amiodarone 400 MG tablet  Commonly known as:  PACERONE  Take 1 tablet (400 mg total) by mouth 2 (two) times daily.     amiodarone 200 MG tablet  Commonly known as:  PACERONE  Take 1 tablet (200 mg total) by mouth 2 (two) times daily. Begin after  completing 2 wk course of 400mg  twice daily  Start taking on:  03/21/2013     carvedilol 25 MG tablet  Commonly known as:  COREG  Take 25 mg by mouth 2 (two) times daily with a meal.     ferrous sulfate 325 (65 FE) MG tablet  Take 1 tablet (325 mg total) by mouth 3 (three) times daily with meals.     Fish Oil 1000 MG Caps  Take 1,000 capsules by mouth daily.     furosemide 40 MG tablet  Commonly known as:  LASIX  Take 1 tablet (40 mg total) by mouth daily.     GLUCOSAMINE PO  Take 1,000 mg by mouth.     imipramine 25 MG tablet  Commonly known as:  TOFRANIL  Take 50 mg by mouth at bedtime.     levothyroxine 200 MCG tablet  Commonly known as:  SYNTHROID, LEVOTHROID  Take 200 mcg by mouth daily.     MENS 50+ MULTI VITAMIN/MIN PO  Take 1 tablet by mouth daily.     pantoprazole 40 MG tablet  Commonly known as:  PROTONIX  Take 40 mg by mouth daily.     polycarbophil 625 MG tablet  Commonly known as:  FIBERCON  Take 1,250 mg by mouth daily.     potassium chloride SA 20 MEQ tablet  Commonly known as:  K-DUR,KLOR-CON  Take 20-40 mEq by mouth 2 (two) times daily. 40 MeQ in the AM and 20 MeQ in the PM     pravastatin 20 MG tablet  Commonly known as:  PRAVACHOL  Take 10 mg by mouth at bedtime.     Rivaroxaban 15 MG Tabs tablet  Commonly known as:  XARELTO  Take 15 mg by mouth daily.       Vitamin D 2000 UNITS Caps  Take 2,000 Units by mouth daily.       Outstanding Labs/Studies  BMET @ f/u visit.  Duration of Discharge Encounter   Greater than 30 minutes including physician time.  Signed, Nicolasa Ducking NP 03/06/2013, 10:43 AM  Hillis Range MD

## 2013-03-06 NOTE — Telephone Encounter (Signed)
New problem    TCM with Tereso Newcomer on  12/3 @ 8:50 . Per [facility]

## 2013-03-08 ENCOUNTER — Telehealth: Payer: Self-pay | Admitting: Cardiology

## 2013-03-08 ENCOUNTER — Telehealth: Payer: Self-pay | Admitting: Internal Medicine

## 2013-03-08 NOTE — Telephone Encounter (Signed)
lmtcb post hosp

## 2013-03-08 NOTE — Telephone Encounter (Signed)
Wife called to report HR of 41. No sx. Pt recently discharged with amio load and ongoing coreg 25mg  BID.   We discussed that amio can decrease HR and that pt likely does not need that much coreg.   Will plan to check HR in AM. If HR > 50, give 12.5mg  coreg. If HR < 50, will page MD on call.  Wife keep check BP as well. If hypertensive she will call or page. Of note, losartan was recently stopped in setting of hypotension with AF with RVR - this could be restarted if needed.

## 2013-03-08 NOTE — Telephone Encounter (Signed)
SPOKE WITH  PT'S  WIFE   IS  BACK  IN  AFIB  SINCE  LAST   NIGHT  AROUND  10:00 PM  RATE RUNNING IN THE  80'S  WAS  TOLD TO CALL IF  WENT OUT OF RHYTHM  WILL REVIEW  WITH  DR ALLRED./CY  DISCUSSED WITH  DR ALLRED  MEDS  HAVE  BEEN CHANGE   FROM  RECENT  DISCHARGE  WILL NEED TO  HAVE  GOOD  RATE CONTROL  AND  NEED TO SPEAK  WITH  PT  ON MON  PER DR ALLRED  CALLED  AND SPOKE  WITH  PT'S  WIFE   CURRENTLY  HEART  RATE  IS  60   AND  KNOWS  WILL  CALL MON  FOR  AN UPDATE .Zack Seal WILL FORWARD TO  Dennis Bast RN

## 2013-03-08 NOTE — Telephone Encounter (Signed)
New Problem  Pt wife called// States pt is in AFIB and requests a call back

## 2013-03-09 ENCOUNTER — Telehealth: Payer: Self-pay | Admitting: Physician Assistant

## 2013-03-09 NOTE — Telephone Encounter (Signed)
[name] [name] called because her husband had bradycardia last p.m. and his Coreg was to be decreased. This a.m., his systolic blood pressure was over 140, heart rate 60 and she was concerned that she might need to restart losartan (previously discontinued.)  Advised her she should cut his Coreg 25 mg tablets in half and give him one now. Check his blood pressure and heart rate at noon and give one half of his losartan 100 mg if he needs additional blood pressure control. This p.m., give the other half of the Coreg. If ENT needs for losartan today she can give both the losartan the Coreg together in the morning. She is to call she has any questions or concerns.

## 2013-03-11 ENCOUNTER — Other Ambulatory Visit (INDEPENDENT_AMBULATORY_CARE_PROVIDER_SITE_OTHER): Payer: Managed Care, Other (non HMO)

## 2013-03-11 DIAGNOSIS — E876 Hypokalemia: Secondary | ICD-10-CM

## 2013-03-11 LAB — BASIC METABOLIC PANEL
CO2: 24 mEq/L (ref 19–32)
Chloride: 105 mEq/L (ref 96–112)
Creatinine, Ser: 2 mg/dL — ABNORMAL HIGH (ref 0.4–1.5)
Sodium: 137 mEq/L (ref 135–145)

## 2013-03-11 NOTE — Telephone Encounter (Signed)
Spoke with patient and he has a cold.  No fever today.  He is going to call back if he does not feel better on WED and reschdule procedure

## 2013-03-12 NOTE — Discharge Summary (Signed)
Discharge Summary  Patient ID: [name] [name],   MRN: [id number], DOB/AGE: 1951/02/23 62 y.o. Admit date: 03/04/2013 Discharge date: 03/06/2013  Primary Care Provider: Garlan Fillers Primary Cardiologist: Catalina Gravel   Primary Electrophysiologist:  J. Daphna Lafuente, MD    Discharge Diagnoses Principal Problem:   Atrial fibrillation with RVR             **s/p successful DCCV this admission.             *Amiodarone initiated this admission.             *Chronic Xarelto anticoagulation (renal dosing)  Active Problems:   Acute on chronic combined systolic and diastolic congestive heart failure, NYHA class 1             *Net negative diuresis of 5.2 Liters this admission with discharge weight of 241 lbs.   Hypertension   Obesity   CKD (chronic kidney disease), stage III   Obstructive sleep apnea  Allergies Allergies   Allergen  Reactions   .  Phenergan [Promethazine Hcl]  Itching       Hallucination    .  Aspirin         Hx of ulcer .  Bystolic [Nebivolol Hcl]         Bradycardia .  Calcium Channel Blockers         LE edema   .  Prednisone         Hx of ulcer   .  Nsaids         Hx ulcer    Procedures  2D Echocardiogram 11.25.2014  Study Conclusions  - Left ventricle: The cavity size was normal. Systolic   function was normal. The estimated ejection fraction was   in the range of 50% to 55%. Wall motion was normal; there   were no regional wall motion abnormalities. - Mitral valve: Mild to moderate regurgitation. - Left atrium: The atrium was mildly dilated. - Right ventricle: The cavity size was mildly dilated. Wall   thickness was normal. - Right atrium: The atrium was mildly dilated. - Pericardium, extracardiac: A small pericardial effusion   was identified posterior to the heart and along the left   ventricular free wall. The fluid had no internal   echoes.There was no evidence of hemodynamic compromise. _____________    DCCV 11.25.2014  Conclusions:    1.  Successful cardioversion of afib to sinus rhythm 2.  No early apparent complications. _____________   History of Present Illness  62 y/o male with h/o atrial fibrillation and non-ischemic cardiomyopathy with EF previously as low as 35%, who has previously failed multiple antiarrhythmics and cardioversions.  He recently underwent atrial fibrillation ablation/pulmonary vein isolation in October of this year and initially did well with only brief paroxysms of recurrent A. fib. In the early morning hours of November 23, patient awoke from sleep with severe dyspnea. This persisted until later in the morning when he finally presented to urgent care where he was found to be in atrial fibrillation with a rapid ventricular response. There was also concern for anterior ST segment elevation. EMS was called and a code STEMI was activated. Patient was taken to the [place] where he was met by cardiology an ECG was reviewed. It was not felt to represent an acute MI in the code STEMI was called off. Chest x-ray showed bilateral pulmonary edema and initial troponin was normal.  He was admitted for further management of rapid atrial fibrillation along  with acute on chronic combined CHF.  Hospital Course  Following admission, flecainide therapy was discontinued given its failure to maintain sinus rhythm. He was seen by electrophysiology and recommendation was made to initiate amiodarone therapy with a plan for cardioversion. A 2-D echocardiogram was carried out on November 25, revealing normal LV function. Successful cardioversion was then performed on the morning of the 25th as well. Post cardioversion, [name] [name] has maintained sinus rhythm. He has diuresis a net negative of over 5 L, and he has had marked symptomatic improvement. We will discharge [name] [name] home today in good condition. He will continue on amiodarone 400 mg twice a day for the next 2 weeks at which time he will drop his dose to 200 mg twice a day  x4 weeks, and then subsequently 200 mg daily. We have arranged for office followup in one week. We have added oral Lasix therapy to his home regimen and he will continue on oral potassium replacement as well.  We will repeat a bmet when he is seen in clinic next week.  Discharge Vitals Blood pressure 141/81, pulse 71, temperature 98.2 F (36.8 C), temperature source Oral, resp. rate 18, height 5\' 8"  (1.727 m), weight 241 lb 4.8 oz (109.453 kg), SpO2 97.00%.   Filed Weights     03/05/13 0035  03/06/13 0507   Weight:  251 lb (113.853 kg)  241 lb 4.8 oz (109.453 kg)    Labs  CBC Recent Labs   03/05/13 0123  03/06/13 0418   WBC  9.2  8.9   HGB  11.1*  11.3*   HCT  34.3*  35.5*   MCV  83.3  84.5   PLT  192  187    Basic Metabolic Panel Recent Labs   03/05/13 0330  03/05/13 0730  03/06/13 0418   NA   --   140  141   K   --   3.3*  3.5   CL   --   103  106   CO2   --   21  24   GLUCOSE   --   164*  150*   BUN   --   25*  24*   CREATININE   --   1.71*  1.43*   CALCIUM   --   8.7  8.4   MG  1.9   --    --     Liver Function Tests Recent Labs   03/05/13 0123  03/06/13 0418   AST  15  25   ALT  15  16   ALKPHOS  94  94   BILITOT  0.5  0.4   PROT  6.3  6.2   ALBUMIN  3.3*  3.1*    Cardiac Enzymes Recent Labs   03/05/13 1830  03/06/13 0037  03/06/13 0804   CKTOTAL  256*  532*  647*   CKMB  2.8  4.3*  4.6*   TROPONINI  <0.30  <0.30  <0.30    Hemoglobin A1C Recent Labs   03/05/13 0730   HGBA1C  6.9*    Thyroid Function Tests Recent Labs   03/05/13 0730   TSH  1.859    Disposition  Pt is being discharged home today in good condition.  Follow-up Plans & Appointments    Follow-up Information     Follow up with Hillis Range, MD On 05/08/2013. (10:00 AM)      Specialty:  Cardiology     Contact information:  [address] [place] [address] [phone]    Follow up with Tereso Newcomer, PA-C On 03/13/2013. (8:50 AM - Dr. Hoyle Barr PA.)       Specialty:  Physician Assistant     Contact information:     [address] [place] [address] [phone]        Follow up with Garlan Fillers, MD. (as scheduled.)      Specialty:  Internal Medicine     Contact information:     [address] [facility] [place] [address] [phone]      Discharge Medications     Medication List      STOP taking these medications           flecainide 100 MG tablet   Commonly known as:  TAMBOCOR      TAKE these medications           amiodarone 400 MG tablet   Commonly known as:  PACERONE   Take 1 tablet (400 mg total) by mouth 2 (two) times daily.        amiodarone 200 MG tablet   Commonly known as:  PACERONE   Take 1 tablet (200 mg total) by mouth 2 (two) times daily. Begin after completing 2 wk course of 400mg  twice daily   Start taking on:  03/21/2013        carvedilol 25 MG tablet   Commonly known as:  COREG   Take 25 mg by mouth 2 (two) times daily with a meal.        ferrous sulfate 325 (65 FE) MG tablet   Take 1 tablet (325 mg total) by mouth 3 (three) times daily with meals.        Fish Oil 1000 MG Caps   Take 1,000 capsules by mouth daily.        furosemide 40 MG tablet   Commonly known as:  LASIX   Take 1 tablet (40 mg total) by mouth daily.        GLUCOSAMINE PO   Take 1,000 mg by mouth.        imipramine 25 MG tablet   Commonly known as:  TOFRANIL   Take 50 mg by mouth at bedtime.        levothyroxine 200 MCG tablet   Commonly known as:  SYNTHROID, LEVOTHROID   Take 200 mcg by mouth daily.        MENS 50+ MULTI VITAMIN/MIN PO   Take 1 tablet by mouth daily.        pantoprazole 40 MG tablet   Commonly known as:  PROTONIX   Take 40 mg by mouth daily.        polycarbophil 625 MG tablet   Commonly known as:  FIBERCON   Take 1,250 mg by mouth daily.        potassium chloride SA 20 MEQ tablet   Commonly known as:  K-DUR,KLOR-CON   Take 20-40 mEq by  mouth 2 (two) times daily. 40 MeQ in the AM and 20 MeQ in the PM        pravastatin 20 MG tablet   Commonly known as:  PRAVACHOL   Take 10 mg by mouth at bedtime.        Rivaroxaban 15 MG Tabs tablet   Commonly known as:  XARELTO   Take 15 mg by mouth daily.        Vitamin D 2000 UNITS Caps  Take 2,000 Units by mouth daily.    Outstanding Labs/Studies  BMET @ f/u visit.  Duration of Discharge Encounter    Greater than 30 minutes including physician time.  Signed, Nicolasa Ducking NP 03/06/2013, 10:43 AM   Hillis Range MD     Revision History...     Date/Time User Action   03/07/2013 10:23 PM Hillis Range, MD Sign   03/06/2013 12:04 PM Ok Anis, NP Sign   03/06/2013 10:59 AM Ok Anis, NP Sign  View Details Report      Hillis Range MD 1:19 PM 03/14/2013

## 2013-03-13 ENCOUNTER — Encounter: Payer: Self-pay | Admitting: Physician Assistant

## 2013-03-13 ENCOUNTER — Ambulatory Visit (INDEPENDENT_AMBULATORY_CARE_PROVIDER_SITE_OTHER): Payer: Managed Care, Other (non HMO) | Admitting: Physician Assistant

## 2013-03-13 VITALS — BP 124/70 | HR 88 | Ht 68.0 in | Wt 242.8 lb

## 2013-03-13 DIAGNOSIS — I4891 Unspecified atrial fibrillation: Secondary | ICD-10-CM

## 2013-03-13 DIAGNOSIS — I10 Essential (primary) hypertension: Secondary | ICD-10-CM

## 2013-03-13 DIAGNOSIS — E785 Hyperlipidemia, unspecified: Secondary | ICD-10-CM

## 2013-03-13 DIAGNOSIS — I5042 Chronic combined systolic (congestive) and diastolic (congestive) heart failure: Secondary | ICD-10-CM

## 2013-03-13 DIAGNOSIS — N183 Chronic kidney disease, stage 3 unspecified: Secondary | ICD-10-CM

## 2013-03-13 DIAGNOSIS — D649 Anemia, unspecified: Secondary | ICD-10-CM

## 2013-03-13 NOTE — Patient Instructions (Signed)
NO CHANGES WITH YOUR MEDICATIONS; CONTINUE CURRENT REGIMEN  LAB WORK ON 03/20/13 (BMET)  PLEASE FOLLOW UP WITH SCOTT WEAVER, PAC IN 2 WEEKS SAME DAY DR. Johney Frame IS IN THE OFFICE 03/27/13 @ 11:30

## 2013-03-13 NOTE — Progress Notes (Signed)
[address] [place], [place]  [address] Phone: [phone] Fax:  [phone]  Date:  03/13/2013   ID:  [name] [name], DOB July 18, 1950, MRN [id number]  PCP:  Garlan Fillers, MD  Cardiologist:  Dr. Everette Rank   Electrophysiologist:  Dr. Hillis Range    History of Present Illness: [name] [name] is a 62 y.o. 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 Myoview (10/12):  No ischemia or infarct, EF 58%.  Echo (5/13): EF 60-65%, mild LAE, trace MR, mildly dilated aortic root and descending aorta.  He underwent atrial fibrillation and atrial flutter ablation with Dr. Hillis Range 12/06/11.  He had a gastroenteritis in 05/2012 and developed atrial fibrillation during an episode of severe wretching. He converted to NSR spontaneously.  He was seen in the office in 01/2013 with recurrent persistent AFib.  He was taken off of Multaq, placed on Flecainide and set up for redo AFib ablation.  He underwent TEE 02/05/13 that demonstrated EF 60-65% and no LAA clot (LA 5 x 5 cm).  He underwent EPS and re-do PVI ablation with Dr. Johney Frame the same day.  He was re-admitted 11/24-11/26 with a/c combined systolic and diastolic CHF in the setting of recurrent atrial fibrillation with RVR. Flecainide was discontinued. He was placed on amiodarone. Echo (03/05/13): EF 50-55%, mild to moderate MR, mild LAE, mild RVE, mild RAE, small effusion posterior to heart. Patient underwent successful cardioversion with restoration of NSR.    Patient tells me that he was in atrial fibrillation for several days after discharge. He has felt well and remained in NSR until he arrived in our waiting room today. He feels significantly weak when he goes out of rhythm. Otherwise, he denies dyspnea, chest pain,  syncope. He sleeps on 2 pillows chronically. He has noted some mild pedal edema today. He denies PND.  Recent Labs: 03/05/2013: Pro B Natriuretic peptide (BNP) 1099.0*; TSH 1.859  03/06/2013: ALT 16; Hemoglobin 11.3*  03/11/2013: Creatinine 2.0*; Potassium 4.4    Wt Readings from Last 3 Encounters:  03/06/13 241 lb 4.8 oz (109.453 kg)  03/06/13 241 lb 4.8 oz (109.453 kg)  03/04/13 251 lb 12.8 oz (114.216 kg)     Past Medical History  Diagnosis Date  . Hypertension   . PAF (paroxysmal atrial fibrillation)     a. failed DCCV and multiple anti-arrhythmic drugs (multaq/flecainide);   b. s/p  PVI isolation with ablation of AFib 8/13; c. repeat PVI 01/2013;  d. 02/2013 repeat DCCV->Amio load/xarelto.  . Renal calculi   . Chronic combined systolic and diastolic CHF (congestive heart failure)     a. LVEF previously 35% felt to be due tachycardia;  b. 02/2013 Echo: EF 50-55%, no rwma, mildly dil LA/RA, mild to mod MR.  . Diabetes mellitus   . Obesities, morbid   . Hypothyroid   . Gastric ulcer     s/p prior surgery  . Diverticulosis   . Erectile dysfunction   . Obstructive sleep apnea     noncompliant with CPAP  . Atrial flutter     a. s/p RFCA 8/13  . CKD (chronic kidney disease), stage III     hx of a/c renal failure during episode of diverticulitis 9/13 in [place], [place]    Current Outpatient Prescriptions  Medication Sig Dispense  Refill  . [START ON 03/21/2013] amiodarone (PACERONE) 200 MG tablet Take 1 tablet (200 mg total) by mouth 2 (two) times daily. Begin after completing 2 wk course of 400mg  twice daily  60 tablet  3  . amiodarone (PACERONE) 400 MG tablet Take 1 tablet (400 mg total) by mouth 2 (two) times daily.  28 tablet  0  . carvedilol (COREG) 25 MG tablet Take 25 mg by mouth 2 (two) times daily with a meal.      . Cholecalciferol (VITAMIN D) 2000 UNITS CAPS Take 2,000 Units by mouth daily.      . ferrous sulfate 325 (65 FE) MG tablet Take 1 tablet (325 mg total) by  mouth 3 (three) times daily with meals.  90 tablet  1  . furosemide (LASIX) 40 MG tablet Take 1 tablet (40 mg total) by mouth daily.  30 tablet  6  . GLUCOSAMINE PO Take 1,000 mg by mouth.      Marland Kitchen imipramine (TOFRANIL) 25 MG tablet Take 50 mg by mouth at bedtime.       Marland Kitchen levothyroxine (SYNTHROID, LEVOTHROID) 200 MCG tablet Take 200 mcg by mouth daily.      . Multiple Vitamins-Minerals (MENS 50+ MULTI VITAMIN/MIN PO) Take 1 tablet by mouth daily.      . Omega-3 Fatty Acids (FISH OIL) 1000 MG CAPS Take 1,000 capsules by mouth daily.       . pantoprazole (PROTONIX) 40 MG tablet Take 40 mg by mouth daily.       . polycarbophil (FIBERCON) 625 MG tablet Take 1,250 mg by mouth daily.      . potassium chloride SA (K-DUR,KLOR-CON) 20 MEQ tablet Take 20-40 mEq by mouth 2 (two) times daily. 40 MeQ in the AM and 20 MeQ in the PM      . pravastatin (PRAVACHOL) 20 MG tablet Take 10 mg by mouth at bedtime.       . Rivaroxaban (XARELTO) 15 MG TABS tablet Take 15 mg by mouth daily.       No current facility-administered medications for this visit.    Allergies:    Allergies  Allergen Reactions  . Phenergan [Promethazine Hcl] Itching    Hallucination   . Aspirin     Hx of ulcer   . Bystolic [Nebivolol Hcl]     Bradycardia   . Calcium Channel Blockers     LE edema  . Prednisone     Hx of ulcer  . Nsaids     Hx ulcer    Social History:  The patient  reports that he has never smoked. He does not have any smokeless tobacco history on file. He reports that he does not drink alcohol or use illicit drugs.   Family History:  The patient's family history includes Diabetes in his brother; Emphysema in his mother; Lung cancer in his father. There is no history of Colon cancer.   ROS:  Please see the history of present illness.      All other systems reviewed and negative.   PHYSICAL EXAM: VS:  BP 124/70  Pulse 88  Ht 5\' 8"  (1.727 m)  Wt 242 lb 12.8 oz (110.133 kg)  BMI 36.93 kg/m2 Well nourished,  well developed, in no acute distress HEENT: normal Neck: no JVD  Cardiac:  normal S1, S2; irregularly irregular rhythm; no murmur Lungs:  clear to auscultation bilaterally, no wheezing, rhonchi or rales Abd: soft, nontender, no hepatomegaly Ext: no edema Skin: warm and dry Neuro:  CNs  2-12 intact, no focal abnormalities noted  EKG:  Atrial fibrillation, HR 97     ASSESSMENT AND PLAN:  1. Atrial Fibrillation:  He is status post recent redo PVI ablation for atrial fibrillation. He has developed recurrent atrial fibrillation. He is status post recent admission for acute on chronic combined CHF in the setting of atrial fibrillation with RVR. He is now on amiodarone. He remains on Xarelto. I reviewed his case with Dr. Johney Frame. He is still within the ninety-day window from his recent ablation. He will likely return to NSR with continued therapy. He will continue on his current dosing regimen of amiodarone and coreg. I will see him back in several weeks. If he remains in atrial fibrillation at that time, we will consider redo cardioversion.   2. Chronic Combined Systolic and Diastolic CHF:  Volume appears stable. Continue current therapy. 3. Hypertension:  Controlled. 4. CKD:  Recent creatinine slightly elevated. Repeat basic metabolic panel next week. 5. Hyperlipidemia:  Continue statin. 6. Diabetes Mellitus:   Follow up with primary care. 7. Anemia: Follow up with gastroenterology. Obtain follow up CBC next week at patient's request. 8. Disposition:  Follow up with me in 2-3 weeks. Keep follow up with Dr. Johney Frame 05/08/13.  Signed, Tereso Newcomer, PA-C  03/13/2013 8:10 AM

## 2013-03-14 ENCOUNTER — Telehealth: Payer: Self-pay | Admitting: Internal Medicine

## 2013-03-14 NOTE — Telephone Encounter (Signed)
New message  Pt wife called states his BP is 145/105// was taken off of losartin when he went into afib// Should he resume taking the medication if his Bp continues to stay high//please call back to discuss.

## 2013-03-14 NOTE — Telephone Encounter (Signed)
Patient's wife states that patient is having increased BP today (146/104, 145/105). HR 64. Denies cough, SOB, wt gain, headache, dizziness or other symptoms. States that patient used to take Losartin 100mg  daily although this was discontinued "some time ago".  Stated that patient has been allowed to take Losartin for occasional break through elevated blood pressure and he is requesting to do that at this time to get his BP under better control. Reviewed diet and medication compliance with patient and no problems noted. Reviewed with Dr. Antoine Poche and he advised that patient to take Losartin 50mg  by mouth once, monitor BP and call office back should BP continue to be elevated/worsen or if patient were to get symptomatic.  Patient verbalized understanding and appreciation for call back/treatment advisement. Routed to Dr. Eldridge Dace and Dr. Johney Frame as Lorain Childes.

## 2013-03-20 ENCOUNTER — Other Ambulatory Visit (INDEPENDENT_AMBULATORY_CARE_PROVIDER_SITE_OTHER): Payer: Managed Care, Other (non HMO)

## 2013-03-20 DIAGNOSIS — I4891 Unspecified atrial fibrillation: Secondary | ICD-10-CM

## 2013-03-20 DIAGNOSIS — I5042 Chronic combined systolic (congestive) and diastolic (congestive) heart failure: Secondary | ICD-10-CM

## 2013-03-20 LAB — BASIC METABOLIC PANEL
BUN: 24 mg/dL — ABNORMAL HIGH (ref 6–23)
Calcium: 9 mg/dL (ref 8.4–10.5)
Chloride: 105 mEq/L (ref 96–112)
Creatinine, Ser: 1.7 mg/dL — ABNORMAL HIGH (ref 0.4–1.5)
Glucose, Bld: 129 mg/dL — ABNORMAL HIGH (ref 70–99)
Potassium: 3.7 mEq/L (ref 3.5–5.1)

## 2013-03-20 LAB — CBC WITH DIFFERENTIAL/PLATELET
Basophils Relative: 0.2 % (ref 0.0–3.0)
Eosinophils Absolute: 0.1 10*3/uL (ref 0.0–0.7)
Eosinophils Relative: 2 % (ref 0.0–5.0)
Hemoglobin: 12.2 g/dL — ABNORMAL LOW (ref 13.0–17.0)
Lymphocytes Relative: 26.7 % (ref 12.0–46.0)
Lymphs Abs: 1.9 10*3/uL (ref 0.7–4.0)
MCV: 82 fl (ref 78.0–100.0)
Monocytes Absolute: 0.7 10*3/uL (ref 0.1–1.0)
Neutro Abs: 4.5 10*3/uL (ref 1.4–7.7)
Neutrophils Relative %: 62 % (ref 43.0–77.0)
RBC: 4.5 Mil/uL (ref 4.22–5.81)
WBC: 7.3 10*3/uL (ref 4.5–10.5)

## 2013-03-27 ENCOUNTER — Encounter: Payer: Self-pay | Admitting: Physician Assistant

## 2013-03-27 ENCOUNTER — Ambulatory Visit (INDEPENDENT_AMBULATORY_CARE_PROVIDER_SITE_OTHER): Payer: Managed Care, Other (non HMO) | Admitting: Physician Assistant

## 2013-03-27 VITALS — BP 130/80 | HR 55 | Ht 68.0 in | Wt 245.4 lb

## 2013-03-27 DIAGNOSIS — I5042 Chronic combined systolic (congestive) and diastolic (congestive) heart failure: Secondary | ICD-10-CM

## 2013-03-27 DIAGNOSIS — I4891 Unspecified atrial fibrillation: Secondary | ICD-10-CM

## 2013-03-27 DIAGNOSIS — I10 Essential (primary) hypertension: Secondary | ICD-10-CM

## 2013-03-27 NOTE — Patient Instructions (Signed)
PLEASE MAKE SURE TO KEEP YOUR FOLLOW UP WITH DR. ALLRED.  Your physician recommends that you continue on your current medications as directed. Please refer to the Current Medication list given to you today.

## 2013-03-27 NOTE — Progress Notes (Signed)
[address] [place], [place]  [address] Phone: [phone] Fax:  [phone]  Date:  03/27/2013   ID:  [name] [name], DOB 02/19/1951, MRN [id number]  PCP:  [name] Fillers, MD  Cardiologist:  Dr. Everette [name]   Electrophysiologist:  Dr. Hillis [name]    History of Present Illness: [name] [name] is a 62 y.o. 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 Myoview (10/12):  No ischemia or infarct, EF 58%.  Echo (5/13): EF 60-65%, mild LAE, trace MR, mildly dilated aortic root and descending aorta.  He underwent atrial fibrillation and atrial flutter ablation with Dr. Hillis [name] 12/06/11.  He had a gastroenteritis in 05/2012 and developed atrial fibrillation during an episode of severe wretching and converted to NSR spontaneously.  He was seen in the office in 01/2013 with recurrent persistent AFib.  He was taken off of Multaq, placed on Flecainide and set up for redo AFib ablation.  He underwent TEE 02/05/13 that demonstrated EF 60-65% and no LAA clot (LA 5 x 5 cm).  He underwent EPS and re-do PVI ablation with Dr. Johney [name] the same day.  He was re-admitted 11/24-11/26 with a/c combined systolic and diastolic CHF in the setting of recurrent atrial fibrillation with RVR. Flecainide was discontinued. He was placed on amiodarone. Echo (03/05/13): EF 50-55%, mild to moderate MR, mild LAE, mild RVE, mild RAE, small effusion posterior to heart. Patient underwent successful cardioversion with restoration of NSR.    I saw him in follow up 03/13/13. He was back in atrial fibrillation and symptomatic. I reviewed this with Dr. Johney [name] who recommended continued medical therapy. He was set up for follow up today with an eye towards repeat DCCV if he remained in atrial fibrillation.  He  is doing much better.  He remains in NSR today.  The patient denies chest pain, shortness of breath, syncope, orthopnea, PND or significant pedal edema.   Recent Labs: 03/05/2013: Pro B Natriuretic peptide (BNP) 1099.0*; TSH 1.859  03/06/2013: ALT 16  03/20/2013: Creatinine 1.7*; Hemoglobin 12.2*; Potassium 3.7    Wt Readings from Last 3 Encounters:  03/27/13 245 lb 6.4 oz (111.313 kg)  03/13/13 242 lb 12.8 oz (110.133 kg)  03/06/13 241 lb 4.8 oz (109.453 kg)     Past Medical History  Diagnosis Date  . Hypertension   . PAF (paroxysmal atrial fibrillation)     a. failed DCCV and multiple anti-arrhythmic drugs (multaq/flecainide);   b. s/p  PVI isolation with ablation of AFib 8/13; c. repeat PVI 01/2013;  d. 02/2013 repeat DCCV->Amio load/xarelto.  . Renal calculi   . Chronic combined systolic and diastolic CHF (congestive heart failure)     a. LVEF previously 35% felt to be due tachycardia;  b. 02/2013 Echo: EF 50-55%, no rwma, mildly dil LA/RA, mild to mod MR.  . Diabetes mellitus   . Obesities, morbid   . Hypothyroid   . Gastric ulcer     s/p prior surgery  . Diverticulosis   . Erectile dysfunction   . Obstructive sleep apnea     noncompliant with CPAP  . Atrial flutter     a. s/p RFCA 8/13  . CKD (chronic kidney disease), stage III     hx of a/c renal failure during episode of diverticulitis 9/13 in [place],  [place]    Current Outpatient Prescriptions  Medication Sig Dispense Refill  . amiodarone (PACERONE) 200 MG tablet Take 1 tablet (200 mg total) by mouth 2 (two) times daily. Begin after completing 2 wk course of 400mg  twice daily  60 tablet  3  . amiodarone (PACERONE) 400 MG tablet Take 1 tablet (400 mg total) by mouth 2 (two) times daily.  28 tablet  0  . carvedilol (COREG) 25 MG tablet Take 25 mg by mouth 2 (two) times daily with a meal.      . Cholecalciferol (VITAMIN D) 2000 UNITS CAPS Take 2,000 Units by mouth daily.      . ferrous sulfate 325 (65 FE) MG tablet  Take 1 tablet (325 mg total) by mouth 3 (three) times daily with meals.  90 tablet  1  . furosemide (LASIX) 40 MG tablet Take 1 tablet (40 mg total) by mouth daily.  30 tablet  6  . GLUCOSAMINE PO Take 1,000 mg by mouth.      Marland Kitchen imipramine (TOFRANIL) 25 MG tablet Take 50 mg by mouth at bedtime.       Marland Kitchen levothyroxine (SYNTHROID, LEVOTHROID) 200 MCG tablet Take 200 mcg by mouth daily.      . Multiple Vitamins-Minerals (MENS 50+ MULTI VITAMIN/MIN PO) Take 1 tablet by mouth daily.      . Omega-3 Fatty Acids (FISH OIL) 1000 MG CAPS Take 1,000 capsules by mouth daily.       . pantoprazole (PROTONIX) 40 MG tablet Take 40 mg by mouth daily.       . polycarbophil (FIBERCON) 625 MG tablet Take 1,250 mg by mouth daily.      . potassium chloride SA (K-DUR,KLOR-CON) 20 MEQ tablet Take 20-40 mEq by mouth 2 (two) times daily. 40 MeQ in the AM and 20 MeQ in the PM      . pravastatin (PRAVACHOL) 20 MG tablet Take 10 mg by mouth at bedtime.       . Rivaroxaban (XARELTO) 15 MG TABS tablet Take 15 mg by mouth daily.       No current facility-administered medications for this visit.    Allergies:    Allergies  Allergen Reactions  . Phenergan [Promethazine Hcl] Itching    Hallucination   . Aspirin     Hx of ulcer   . Bystolic [Nebivolol Hcl]     Bradycardia   . Calcium Channel Blockers     LE edema  . Prednisone     Hx of ulcer  . Nsaids     Hx ulcer    Social History:  The patient  reports that he has never smoked. He does not have any smokeless tobacco history on file. He reports that he does not drink alcohol or use illicit drugs.   Family History:  The patient's family history includes Diabetes in his brother; Emphysema in his mother; Lung cancer in his father. There is no history of Colon cancer.   ROS:  Please see the history of present illness.  No melena or hematochezia.    All other systems reviewed and negative.   PHYSICAL EXAM: VS:  BP 130/80  Pulse 55  Ht 5\' 8"  (1.727 m)  Wt 245 lb  6.4 oz (111.313 kg)  BMI 37.32 kg/m2 Well nourished, well developed, in no acute distress HEENT: normal Neck: no JVD  Cardiac:  normal S1, S2; RRR; no murmur Lungs:  clear to auscultation bilaterally, no wheezing, rhonchi or rales Abd: soft, nontender, no hepatomegaly Ext:  no edema Skin: warm and dry Neuro:  CNs 2-12 intact, no focal abnormalities noted  EKG:   Sinus brady, HR 55, QTc 472  ASSESSMENT AND PLAN:  1. Atrial Fibrillation:  Maintaining NSR.  Continue Xarelto.  F/u with Dr. Hillis [name] as planned.  2. Chronic Combined Systolic and Diastolic CHF:   Volume appears stable. Continue current therapy. 3. Hypertension:  Controlled. 4. CKD:  Renal function stable by recent lab work.  5. Hyperlipidemia:  Continue statin. 6. Diabetes Mellitus:   Follow up with primary care. 7. Anemia:  Recent Hgb improved. However, he tells me it has dropped again.  EGD is planned with GI.   8. Disposition:  Follow up with Dr. Johney [name] 05/08/13.  Signed, Tereso Newcomer, PA-C  03/27/2013 11:57 AM

## 2013-04-05 DIAGNOSIS — I10 Essential (primary) hypertension: Secondary | ICD-10-CM

## 2013-04-05 DIAGNOSIS — E785 Hyperlipidemia, unspecified: Secondary | ICD-10-CM

## 2013-04-05 DIAGNOSIS — I5042 Chronic combined systolic (congestive) and diastolic (congestive) heart failure: Secondary | ICD-10-CM

## 2013-04-05 DIAGNOSIS — D649 Anemia, unspecified: Secondary | ICD-10-CM

## 2013-04-09 ENCOUNTER — Encounter (HOSPITAL_COMMUNITY)
Admission: RE | Disposition: A | Discharge status: Home / Self Care | Payer: Self-pay | Source: Ambulatory Visit | Attending: Gastroenterology

## 2013-04-09 ENCOUNTER — Encounter (HOSPITAL_COMMUNITY): Payer: Self-pay | Admitting: *Deleted

## 2013-04-09 ENCOUNTER — Ambulatory Visit (HOSPITAL_COMMUNITY)
Admission: RE | Admit: 2013-04-09 | Discharge: 2013-04-09 | Disposition: A | Discharge status: Home / Self Care | Payer: Managed Care, Other (non HMO) | Source: Ambulatory Visit | Attending: Gastroenterology | Admitting: Gastroenterology

## 2013-04-09 DIAGNOSIS — D509 Iron deficiency anemia, unspecified: Secondary | ICD-10-CM | POA: Insufficient documentation

## 2013-04-09 DIAGNOSIS — Z860100 Personal history of colon polyps, unspecified: Secondary | ICD-10-CM | POA: Insufficient documentation

## 2013-04-09 DIAGNOSIS — I4891 Unspecified atrial fibrillation: Secondary | ICD-10-CM | POA: Insufficient documentation

## 2013-04-09 DIAGNOSIS — I10 Essential (primary) hypertension: Secondary | ICD-10-CM | POA: Insufficient documentation

## 2013-04-09 HISTORY — PX: ESOPHAGOGASTRODUODENOSCOPY: SHX5428

## 2013-04-09 SURGERY — EGD (ESOPHAGOGASTRODUODENOSCOPY)
Anesthesia: Moderate Sedation

## 2013-04-09 MED ORDER — MIDAZOLAM HCL 10 MG/2ML IJ SOLN
INTRAMUSCULAR | Status: AC
  Filled 2013-04-09: qty 2

## 2013-04-09 MED ORDER — FENTANYL CITRATE 0.05 MG/ML IJ SOLN
INTRAMUSCULAR | Status: DC | PRN
  Administered 2013-04-09: 50 ug via INTRAVENOUS
  Administered 2013-04-09: 25 ug via INTRAVENOUS

## 2013-04-09 MED ORDER — DIPHENHYDRAMINE HCL 50 MG/ML IJ SOLN
INTRAMUSCULAR | Status: AC
  Filled 2013-04-09: qty 1

## 2013-04-09 MED ORDER — BUTAMBEN-TETRACAINE-BENZOCAINE 2-2-14 % EX AERO
INHALATION_SPRAY | CUTANEOUS | Status: DC | PRN
  Administered 2013-04-09: 2 via TOPICAL

## 2013-04-09 MED ORDER — SODIUM CHLORIDE 0.9 % IV SOLN: INTRAVENOUS | Status: DC

## 2013-04-09 MED ORDER — MIDAZOLAM HCL 10 MG/2ML IJ SOLN
INTRAMUSCULAR | Status: DC | PRN
  Administered 2013-04-09: 2 mg via INTRAVENOUS
  Administered 2013-04-09 (×2): 2.5 mg via INTRAVENOUS

## 2013-04-09 MED ORDER — FENTANYL CITRATE 0.05 MG/ML IJ SOLN
INTRAMUSCULAR | Status: AC
  Filled 2013-04-09: qty 2

## 2013-04-09 NOTE — H&P (Signed)
  Problem: Unexplained microcytic anemia on xarelto  History: The patient is a 62 year old male born 11/04/1950. The patient has paroxysmal atrial fibrillation requiring anticoagulation with xarelto.  On 02/05/2013, the patient's hemoglobin was 11 g with microcytic red blood cell indices.  In 2007, the patient was diagnosed with a bleeding gastric ulcer; followup esophagogastroduodenoscopy following therapy confirmed gastric ulcer healing.  In January 2014, the patient underwent a surveillance colonoscopy with removal of 2 small adenomatous colon polyps.  The patient is scheduled to undergo a diagnostic esophagogastroduodenoscopy to evaluate microcytic anemia.  Past medical history: Wrist surgery. Knee surgery. Benign stomach tumor removed in 2000. Thyroidectomy. Prostatectomy. Kidney stone surgery. Colonoscopy with removal of adenomatous colon polyps. Hypertension. Paroxysmal atrial fibrillation.  Medication allergies: None  Exam: The patient is alert and lying comfortably on the endoscopy stretcher. Abdomen is soft and nontender to palpation. Lungs are clear to auscultation. Cardiac exam reveals a regular rhythm.  Plan: Proceed with diagnostic esophagogastroduodenoscopy to evaluate unexplained microcytic anemia.

## 2013-04-09 NOTE — Op Note (Signed)
Problem: Unexplained microcytic anemia. Normal serum ferritin (27.3 ng/mL). 05/01/2012 colonoscopy with removal of 2 small adenomatous colon polyps.  Endoscopist: Danise Edge  Premedication: Versed 7 mg. Fentanyl 75 mcg.  Procedure: Diagnostic esophagogastroduodenoscopy The patient was placed in the left lateral decubitus position. The Pentax gastroscope was passed through the posterior hypopharynx into the proximal esophagus without difficulty. The hypopharynx, larynx, and vocal cords appeared normal.  Esophagoscopy: The proximal, mid, and lower segments of the esophageal mucosa appeared normal. The squamocolumnar junction is regular and noted at 40 cm from the incisor teeth.  Gastroscopy: Retroflex view of the gastric cardia and fundus was normal. The gastric body,, and pylorus appeared normal.  Duodenoscopy: The duodenal bulb, second portion of duodenum, and third portion of duodenum appeared normal.  Assessment: Normal esophagogastroduodenoscopy.

## 2013-04-10 ENCOUNTER — Encounter: Payer: Self-pay | Admitting: Internal Medicine

## 2013-04-10 ENCOUNTER — Encounter (HOSPITAL_COMMUNITY): Payer: Self-pay | Admitting: Gastroenterology

## 2013-04-11 DIAGNOSIS — G4730 Sleep apnea, unspecified: Secondary | ICD-10-CM

## 2013-04-11 HISTORY — DX: Sleep apnea, unspecified: G47.30

## 2013-05-06 ENCOUNTER — Ambulatory Visit (INDEPENDENT_AMBULATORY_CARE_PROVIDER_SITE_OTHER): Payer: Managed Care, Other (non HMO) | Admitting: Internal Medicine

## 2013-05-06 ENCOUNTER — Encounter: Payer: Self-pay | Admitting: Internal Medicine

## 2013-05-06 VITALS — BP 128/74 | HR 49 | Ht 68.0 in | Wt 242.8 lb

## 2013-05-06 DIAGNOSIS — I4891 Unspecified atrial fibrillation: Secondary | ICD-10-CM

## 2013-05-06 DIAGNOSIS — I4892 Unspecified atrial flutter: Secondary | ICD-10-CM

## 2013-05-06 LAB — HEPATIC FUNCTION PANEL
ALT: 26 U/L (ref 0–53)
AST: 22 U/L (ref 0–37)
Albumin: 3.8 g/dL (ref 3.5–5.2)
Alkaline Phosphatase: 99 U/L (ref 39–117)
BILIRUBIN DIRECT: 0.1 mg/dL (ref 0.0–0.3)
Total Bilirubin: 0.6 mg/dL (ref 0.3–1.2)
Total Protein: 6.8 g/dL (ref 6.0–8.3)

## 2013-05-06 LAB — BASIC METABOLIC PANEL
BUN: 20 mg/dL (ref 6–23)
CALCIUM: 8.8 mg/dL (ref 8.4–10.5)
CO2: 28 meq/L (ref 19–32)
CREATININE: 1.6 mg/dL — AB (ref 0.4–1.5)
Chloride: 104 mEq/L (ref 96–112)
GFR: 45.66 mL/min — AB (ref >60.00)
Glucose, Bld: 157 mg/dL — ABNORMAL HIGH (ref 70–99)
Potassium: 3.3 mEq/L — ABNORMAL LOW (ref 3.5–5.1)
SODIUM: 139 meq/L (ref 135–145)

## 2013-05-06 LAB — TSH: TSH: 7.75 u[IU]/mL — ABNORMAL HIGH (ref 0.35–5.50)

## 2013-05-06 MED ORDER — FUROSEMIDE 40 MG PO TABS: 20.0000 mg | ORAL_TABLET | Freq: Every day | ORAL | Status: DC

## 2013-05-06 MED ORDER — AMIODARONE HCL 200 MG PO TABS: 200.0000 mg | ORAL_TABLET | Freq: Every day | ORAL | Status: DC

## 2013-05-06 NOTE — Patient Instructions (Signed)
Your physician recommends that you schedule a follow-up appointment in: 3 months with Dr Rayann Heman   Your physician has recommended you make the following change in your medication:  1) Decrease Amiodarone to 200mg  daily 2) Decrease Furosemide to 20mg  daily  Your physician recommends that you return for lab work today: BMP/TSH/LIVER

## 2013-05-08 ENCOUNTER — Encounter: Payer: Managed Care, Other (non HMO) | Admitting: Internal Medicine

## 2013-05-10 ENCOUNTER — Encounter: Payer: Self-pay | Admitting: Internal Medicine

## 2013-05-10 ENCOUNTER — Telehealth: Payer: Self-pay | Admitting: Internal Medicine

## 2013-05-10 DIAGNOSIS — E876 Hypokalemia: Secondary | ICD-10-CM

## 2013-05-10 NOTE — Telephone Encounter (Signed)
Left message to return my call.  

## 2013-05-10 NOTE — Telephone Encounter (Signed)
Patient is returning your call, please call [phone].

## 2013-05-10 NOTE — Progress Notes (Signed)
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 male who presents today for routine electrophysiology followup.  Since his atrial fibrillation ablation, the patient reports doing very well.  He had ERAF early but is now doing well with amiodarone.  Today, he denies symptoms of palpitations, chest pain, shortness of breath,  lower extremity edema, dizziness, presyncope, or syncope.  The patient is otherwise without complaint today.   Past Medical History  Diagnosis Date  . Hypertension   . PAF (paroxysmal atrial fibrillation)     a. failed DCCV and multiple anti-arrhythmic drugs (multaq/flecainide);   b. s/p  PVI isolation with ablation of AFib 8/13; c. repeat PVI 01/2013;  d. 02/2013 repeat DCCV->Amio load/xarelto.  . Renal calculi   . Chronic combined systolic and diastolic CHF (congestive heart failure)     a. LVEF previously 35% felt to be due tachycardia;  b. 02/2013 Echo: EF 50-55%, no rwma, mildly dil LA/RA, mild to mod MR.  . Diabetes mellitus   . Obesities, morbid   . Hypothyroid   . Gastric ulcer     s/p prior surgery  . Diverticulosis   . Erectile dysfunction   . Obstructive sleep apnea     noncompliant with CPAP  . Atrial flutter     a. s/p RFCA 8/13  . CKD (chronic kidney disease), stage III     hx of a/c renal failure during episode of diverticulitis 9/13 in [place], [place]  . Anemia    Past Surgical History  Procedure Laterality Date  . Wrist sx  1980    right  . Knee sx  1985    both  . Benign stomach tumor removal  2000  . Thyroidectomy  2009    cysts removal   . Laparoscopic retropubic prostatectomy  02/2007    hx prostate cancer  . Kidney stone removal      multiple  . Tee without cardioversion  08/25/2011    Procedure: TRANSESOPHAGEAL ECHOCARDIOGRAM (TEE);  Surgeon: Jettie Booze, MD;  Location: [place];  Service: Cardiovascular;  Laterality: N/A;  . Cardioversion  08/25/2011    Procedure:  CARDIOVERSION;  Surgeon: Jettie Booze, MD;  Location: [place];  Service: Cardiovascular;  Laterality: N/A;  . Tee without cardioversion  11/09/2011    Procedure: TRANSESOPHAGEAL ECHOCARDIOGRAM (TEE);  Surgeon: Jettie Booze, MD;  Location: [facility] ENDOSCOPY;  Service: Cardiovascular;  Laterality: N/A;  h/p in file drawer  . Atrial fibrillation ablation  12/06/11; 02/05/2013    Afib and atrial flutter ablation by Dr Rayann Heman; repeat PVI by Dr Rayann Heman 02/05/2013  . Tee without cardioversion N/A 02/05/2013    Procedure: TRANSESOPHAGEAL ECHOCARDIOGRAM (TEE);  Surgeon: Candee Furbish, MD;  Location: [facility] ENDOSCOPY;  Service: Cardiovascular;  Laterality: N/A;  . Cardioversion N/A 03/05/2013    Procedure: CARDIOVERSION;  Surgeon: Sinclair Grooms, MD;  Location: [place];  Service: Cardiovascular;  Laterality: N/A;  BEDSIDE   . Esophagogastroduodenoscopy N/A 04/09/2013    Procedure: ESOPHAGOGASTRODUODENOSCOPY (EGD) with possible Balloon Dilation;  Surgeon: Garlan Fair, MD;  Location: [facility] ENDOSCOPY;  Service: Endoscopy;  Laterality: N/A;    Current Outpatient Prescriptions  Medication Sig Dispense Refill  . amiodarone (PACERONE) 200 MG tablet Take 1 tablet (200 mg total) by mouth daily.  90 tablet  3  . carvedilol (COREG) 25 MG tablet Take 25 mg by mouth 2 (two) times daily with a meal.      . Cholecalciferol (VITAMIN D) 2000  UNITS CAPS Take 2,000 Units by mouth daily.      . ferrous sulfate 325 (65 FE) MG tablet Take 1 tablet (325 mg total) by mouth 3 (three) times daily with meals.  90 tablet  1  . furosemide (LASIX) 40 MG tablet Take 0.5 tablets (20 mg total) by mouth daily.  30 tablet  6  . GLUCOSAMINE PO Take 1,000 mg by mouth daily.       Marland Kitchen imipramine (TOFRANIL) 25 MG tablet Take 50 mg by mouth at bedtime.       Marland Kitchen levothyroxine (SYNTHROID, LEVOTHROID) 200 MCG tablet Take 200 mcg by mouth daily.      . Multiple Vitamins-Minerals (MENS 50+ MULTI VITAMIN/MIN PO) Take 1 tablet by mouth  daily.      . Omega-3 Fatty Acids (FISH OIL) 1000 MG CAPS Take 1,000 capsules by mouth daily.       . pantoprazole (PROTONIX) 40 MG tablet Take 40 mg by mouth daily.       . polycarbophil (FIBERCON) 625 MG tablet Take 1,250 mg by mouth daily.      . potassium chloride SA (K-DUR,KLOR-CON) 20 MEQ tablet 40 MeQ in the AM and 20 MeQ in the PM      . pravastatin (PRAVACHOL) 20 MG tablet Take 10 mg by mouth at bedtime.       . Rivaroxaban (XARELTO) 15 MG TABS tablet Take 15 mg by mouth daily.      Marland Kitchen zolpidem (AMBIEN) 10 MG tablet Take 1 tablet by mouth daily as needed.       No current facility-administered medications for this visit.    Physical Exam: Filed Vitals:   05/06/13 1227  BP: 128/74  Pulse: 49  Height: 5\' 8"  (1.727 m)  Weight: 242 lb 12.8 oz (110.133 kg)    GEN- The patient is well appearing, alert and oriented x 3 today.   Head- normocephalic, atraumatic Eyes-  Sclera clear, conjunctiva pink Ears- hearing intact Oropharynx- clear Lungs- Clear to ausculation bilaterally, normal work of breathing Heart- Regular rate and rhythm, no murmurs, rubs or gallops, PMI not laterally displaced GI- soft, NT, ND, + BS Extremities- no clubbing, cyanosis, or edema  ekg today reveals sinus rhythm 49 bpm, otherwise normal ekg  Assessment and Plan:  1. Atrial fibrillation/ atrial flutter Doing well s/p ablation Decrease amiodarone to 200mg  daily Will either reduce to 100mg  daily or stop amiodarone upon return LFTs/TFTs today CHADS2VASC score is 2-3 (EF has improved)  2. Diastolic dysfunction Decrease lasix bmet today  Return in 3 months  He should continue long term anticoagulation

## 2013-05-15 ENCOUNTER — Encounter: Payer: Self-pay | Admitting: Internal Medicine

## 2013-05-16 MED ORDER — POTASSIUM CHLORIDE CRYS ER 20 MEQ PO TBCR: EXTENDED_RELEASE_TABLET | ORAL | Status: DC

## 2013-05-16 NOTE — Telephone Encounter (Signed)
Had his Potassium rechecked at PCP  3.5

## 2013-07-08 ENCOUNTER — Telehealth: Payer: Self-pay | Admitting: Internal Medicine

## 2013-07-08 NOTE — Telephone Encounter (Signed)
New message           Pt has questions about potassium medication. He started taking invokana for diabetes and one side effect for it is an increase in potassium. Does he need his potassium checked?

## 2013-07-08 NOTE — Telephone Encounter (Signed)
Have asked her to call his PCP, who started the new medication and see how long before he needs to have his Potassium checked after starting the medication

## 2013-07-23 ENCOUNTER — Observation Stay (HOSPITAL_COMMUNITY)
Admission: EM | Admit: 2013-07-23 | Discharge: 2013-07-24 | Disposition: A | Discharge status: Home / Self Care | Payer: Managed Care, Other (non HMO) | Attending: Internal Medicine | Admitting: Internal Medicine

## 2013-07-23 ENCOUNTER — Encounter (HOSPITAL_COMMUNITY): Payer: Self-pay | Admitting: Emergency Medicine

## 2013-07-23 ENCOUNTER — Emergency Department (HOSPITAL_COMMUNITY): Payer: Managed Care, Other (non HMO)

## 2013-07-23 DIAGNOSIS — I5042 Chronic combined systolic (congestive) and diastolic (congestive) heart failure: Secondary | ICD-10-CM

## 2013-07-23 DIAGNOSIS — N529 Male erectile dysfunction, unspecified: Secondary | ICD-10-CM | POA: Insufficient documentation

## 2013-07-23 DIAGNOSIS — N183 Chronic kidney disease, stage 3 unspecified: Secondary | ICD-10-CM

## 2013-07-23 DIAGNOSIS — I509 Heart failure, unspecified: Secondary | ICD-10-CM

## 2013-07-23 DIAGNOSIS — E669 Obesity, unspecified: Secondary | ICD-10-CM

## 2013-07-23 DIAGNOSIS — I10 Essential (primary) hypertension: Secondary | ICD-10-CM

## 2013-07-23 DIAGNOSIS — I152 Hypertension secondary to endocrine disorders: Secondary | ICD-10-CM | POA: Diagnosis present

## 2013-07-23 DIAGNOSIS — Z87442 Personal history of urinary calculi: Secondary | ICD-10-CM | POA: Insufficient documentation

## 2013-07-23 DIAGNOSIS — I4891 Unspecified atrial fibrillation: Secondary | ICD-10-CM

## 2013-07-23 DIAGNOSIS — R519 Headache, unspecified: Secondary | ICD-10-CM

## 2013-07-23 DIAGNOSIS — K5730 Diverticulosis of large intestine without perforation or abscess without bleeding: Secondary | ICD-10-CM | POA: Insufficient documentation

## 2013-07-23 DIAGNOSIS — I517 Cardiomegaly: Secondary | ICD-10-CM

## 2013-07-23 DIAGNOSIS — I5043 Acute on chronic combined systolic (congestive) and diastolic (congestive) heart failure: Secondary | ICD-10-CM

## 2013-07-23 DIAGNOSIS — G459 Transient cerebral ischemic attack, unspecified: Secondary | ICD-10-CM

## 2013-07-23 DIAGNOSIS — G43909 Migraine, unspecified, not intractable, without status migrainosus: Principal | ICD-10-CM | POA: Insufficient documentation

## 2013-07-23 DIAGNOSIS — I129 Hypertensive chronic kidney disease with stage 1 through stage 4 chronic kidney disease, or unspecified chronic kidney disease: Secondary | ICD-10-CM | POA: Insufficient documentation

## 2013-07-23 DIAGNOSIS — R42 Dizziness and giddiness: Secondary | ICD-10-CM

## 2013-07-23 DIAGNOSIS — I4892 Unspecified atrial flutter: Secondary | ICD-10-CM

## 2013-07-23 DIAGNOSIS — I5032 Chronic diastolic (congestive) heart failure: Secondary | ICD-10-CM

## 2013-07-23 DIAGNOSIS — D649 Anemia, unspecified: Secondary | ICD-10-CM | POA: Insufficient documentation

## 2013-07-23 DIAGNOSIS — E039 Hypothyroidism, unspecified: Secondary | ICD-10-CM | POA: Insufficient documentation

## 2013-07-23 DIAGNOSIS — K259 Gastric ulcer, unspecified as acute or chronic, without hemorrhage or perforation: Secondary | ICD-10-CM | POA: Insufficient documentation

## 2013-07-23 DIAGNOSIS — R4701 Aphasia: Secondary | ICD-10-CM

## 2013-07-23 DIAGNOSIS — G4733 Obstructive sleep apnea (adult) (pediatric): Secondary | ICD-10-CM

## 2013-07-23 DIAGNOSIS — R51 Headache: Secondary | ICD-10-CM

## 2013-07-23 DIAGNOSIS — E119 Type 2 diabetes mellitus without complications: Secondary | ICD-10-CM | POA: Insufficient documentation

## 2013-07-23 DIAGNOSIS — J810 Acute pulmonary edema: Secondary | ICD-10-CM

## 2013-07-23 DIAGNOSIS — Z79899 Other long term (current) drug therapy: Secondary | ICD-10-CM | POA: Insufficient documentation

## 2013-07-23 LAB — CBC WITH DIFFERENTIAL/PLATELET
Basophils Absolute: 0 10*3/uL (ref 0.0–0.1)
Basophils Relative: 0 % (ref 0–1)
Eosinophils Absolute: 0.2 10*3/uL (ref 0.0–0.7)
Eosinophils Relative: 2 % (ref 0–5)
HCT: 40.5 % (ref 39.0–52.0)
HEMOGLOBIN: 13.8 g/dL (ref 13.0–17.0)
LYMPHS ABS: 1.6 10*3/uL (ref 0.7–4.0)
LYMPHS PCT: 23 % (ref 12–46)
MCH: 31.7 pg (ref 26.0–34.0)
MCHC: 34.1 g/dL (ref 30.0–36.0)
MCV: 93.1 fL (ref 78.0–100.0)
MONOS PCT: 10 % (ref 3–12)
Monocytes Absolute: 0.7 10*3/uL (ref 0.1–1.0)
NEUTROS PCT: 65 % (ref 43–77)
Neutro Abs: 4.5 10*3/uL (ref 1.7–7.7)
PLATELETS: 209 10*3/uL (ref 150–400)
RBC: 4.35 MIL/uL (ref 4.22–5.81)
RDW: 15.8 % — ABNORMAL HIGH (ref 11.5–15.5)
WBC: 7 10*3/uL (ref 4.0–10.5)

## 2013-07-23 LAB — GLUCOSE, CAPILLARY
GLUCOSE-CAPILLARY: 109 mg/dL — AB (ref 70–99)
Glucose-Capillary: 126 mg/dL — ABNORMAL HIGH (ref 70–99)
Glucose-Capillary: 178 mg/dL — ABNORMAL HIGH (ref 70–99)

## 2013-07-23 LAB — URINALYSIS, ROUTINE W REFLEX MICROSCOPIC
Bilirubin Urine: NEGATIVE
Glucose, UA: >1000 mg/dL — AB
HGB URINE DIPSTICK: NEGATIVE
Ketones, ur: NEGATIVE mg/dL
Leukocytes, UA: NEGATIVE
Nitrite: NEGATIVE
PH: 6 (ref 5.0–8.0)
Protein, ur: NEGATIVE mg/dL
SPECIFIC GRAVITY, URINE: 1.019 (ref 1.005–1.030)
Urobilinogen, UA: 0.2 mg/dL (ref 0.0–1.0)

## 2013-07-23 LAB — CSF CELL COUNT WITH DIFFERENTIAL
RBC Count, CSF: 0 /mm3
RBC Count, CSF: 0 /mm3
Tube #: 1
Tube #: 4
WBC CSF: 0 /mm3 (ref 0–5)
WBC, CSF: 1 /mm3 (ref 0–5)

## 2013-07-23 LAB — URINE MICROSCOPIC-ADD ON

## 2013-07-23 LAB — PROTEIN, CSF: Total  Protein, CSF: 25 mg/dL (ref 15–45)

## 2013-07-23 LAB — BASIC METABOLIC PANEL
BUN: 19 mg/dL (ref 6–23)
CHLORIDE: 106 meq/L (ref 96–112)
CO2: 21 meq/L (ref 19–32)
Calcium: 9.2 mg/dL (ref 8.4–10.5)
Creatinine, Ser: 1.35 mg/dL (ref 0.50–1.35)
GFR calc Af Amer: 63 mL/min — ABNORMAL LOW (ref >90)
GFR calc non Af Amer: 54 mL/min — ABNORMAL LOW (ref >90)
GLUCOSE: 180 mg/dL — AB (ref 70–99)
Potassium: 4 mEq/L (ref 3.7–5.3)
SODIUM: 143 meq/L (ref 137–147)

## 2013-07-23 LAB — RAPID URINE DRUG SCREEN, HOSP PERFORMED
Amphetamines: NOT DETECTED
Barbiturates: NOT DETECTED
Benzodiazepines: NOT DETECTED
Cocaine: NOT DETECTED
Opiates: NOT DETECTED
Tetrahydrocannabinol: NOT DETECTED

## 2013-07-23 LAB — GRAM STAIN

## 2013-07-23 LAB — HEMOGLOBIN A1C
Hgb A1c MFr Bld: 7.2 % — ABNORMAL HIGH (ref <5.7)
Mean Plasma Glucose: 160 mg/dL — ABNORMAL HIGH (ref <117)

## 2013-07-23 LAB — GLUCOSE, CSF: GLUCOSE CSF: 91 mg/dL — AB (ref 43–76)

## 2013-07-23 MED ORDER — PREDNISONE 20 MG PO TABS
30.0000 mg | ORAL_TABLET | Freq: Every day | ORAL | Status: DC
  Administered 2013-07-23 – 2013-07-24 (×2): 30 mg via ORAL
  Filled 2013-07-23 (×3): qty 1

## 2013-07-23 MED ORDER — AMIODARONE HCL 200 MG PO TABS
200.0000 mg | ORAL_TABLET | Freq: Every day | ORAL | Status: DC
  Administered 2013-07-24: 200 mg via ORAL
  Filled 2013-07-23: qty 1

## 2013-07-23 MED ORDER — RIVAROXABAN 15 MG PO TABS
15.0000 mg | ORAL_TABLET | Freq: Every day | ORAL | Status: DC
  Filled 2013-07-23: qty 1

## 2013-07-23 MED ORDER — OXYCODONE HCL 5 MG PO TABS: 5.0000 mg | ORAL_TABLET | ORAL | Status: DC | PRN

## 2013-07-23 MED ORDER — ONDANSETRON HCL 4 MG/2ML IJ SOLN
4.0000 mg | Freq: Once | INTRAMUSCULAR | Status: AC
  Administered 2013-07-23: 4 mg via INTRAVENOUS
  Filled 2013-07-23: qty 2

## 2013-07-23 MED ORDER — IMIPRAMINE HCL 50 MG PO TABS
50.0000 mg | ORAL_TABLET | Freq: Every day | ORAL | Status: DC
  Administered 2013-07-23: 50 mg via ORAL
  Filled 2013-07-23 (×2): qty 1

## 2013-07-23 MED ORDER — FLEET ENEMA 7-19 GM/118ML RE ENEM
1.0000 | ENEMA | Freq: Once | RECTAL | Status: AC | PRN
  Filled 2013-07-23: qty 1

## 2013-07-23 MED ORDER — FUROSEMIDE 20 MG PO TABS
20.0000 mg | ORAL_TABLET | Freq: Every day | ORAL | Status: DC
  Administered 2013-07-24: 20 mg via ORAL
  Filled 2013-07-23: qty 1

## 2013-07-23 MED ORDER — POTASSIUM CHLORIDE CRYS ER 20 MEQ PO TBCR
40.0000 meq | EXTENDED_RELEASE_TABLET | Freq: Two times a day (BID) | ORAL | Status: DC
  Administered 2013-07-23 – 2013-07-24 (×3): 40 meq via ORAL
  Filled 2013-07-23 (×4): qty 2

## 2013-07-23 MED ORDER — CANAGLIFLOZIN 300 MG PO TABS
1.0000 | ORAL_TABLET | Freq: Every day | ORAL | Status: DC
  Administered 2013-07-24: 300 mg via ORAL
  Filled 2013-07-23 (×2): qty 1

## 2013-07-23 MED ORDER — VITAMIN D3 25 MCG (1000 UNIT) PO TABS
2000.0000 [IU] | ORAL_TABLET | Freq: Every day | ORAL | Status: DC
  Administered 2013-07-23 – 2013-07-24 (×2): 2000 [IU] via ORAL
  Filled 2013-07-23 (×2): qty 2

## 2013-07-23 MED ORDER — ALUM & MAG HYDROXIDE-SIMETH 200-200-20 MG/5ML PO SUSP
30.0000 mL | Freq: Four times a day (QID) | ORAL | Status: DC | PRN
Start: 2013-07-23 — End: 2013-07-24

## 2013-07-23 MED ORDER — ZOLPIDEM TARTRATE 5 MG PO TABS: 10.0000 mg | ORAL_TABLET | Freq: Every day | ORAL | Status: DC | PRN

## 2013-07-23 MED ORDER — SODIUM CHLORIDE 0.9 % IV BOLUS (SEPSIS)
1000.0000 mL | Freq: Once | INTRAVENOUS | Status: AC
  Administered 2013-07-23: 1000 mL via INTRAVENOUS

## 2013-07-23 MED ORDER — LEVOTHYROXINE SODIUM 200 MCG PO TABS
200.0000 ug | ORAL_TABLET | Freq: Every day | ORAL | Status: DC
Start: 2013-07-24 — End: 2013-07-24
  Administered 2013-07-24: 200 ug via ORAL
  Filled 2013-07-23 (×2): qty 1

## 2013-07-23 MED ORDER — SENNOSIDES-DOCUSATE SODIUM 8.6-50 MG PO TABS
1.0000 | ORAL_TABLET | Freq: Every evening | ORAL | Status: DC | PRN
  Filled 2013-07-23: qty 1

## 2013-07-23 MED ORDER — SODIUM CHLORIDE 0.9 % IJ SOLN
3.0000 mL | Freq: Two times a day (BID) | INTRAMUSCULAR | Status: DC
  Administered 2013-07-23 – 2013-07-24 (×3): 3 mL via INTRAVENOUS

## 2013-07-23 MED ORDER — ONDANSETRON HCL 4 MG PO TABS: 4.0000 mg | ORAL_TABLET | Freq: Four times a day (QID) | ORAL | Status: DC | PRN

## 2013-07-23 MED ORDER — PROCHLORPERAZINE EDISYLATE 5 MG/ML IJ SOLN
10.0000 mg | Freq: Once | INTRAMUSCULAR | Status: AC
  Administered 2013-07-23: 10 mg via INTRAVENOUS
  Filled 2013-07-23: qty 2

## 2013-07-23 MED ORDER — SIMVASTATIN 10 MG PO TABS
10.0000 mg | ORAL_TABLET | Freq: Every day | ORAL | Status: DC
  Administered 2013-07-23: 10 mg via ORAL
  Filled 2013-07-23 (×2): qty 1

## 2013-07-23 MED ORDER — FERROUS SULFATE 325 (65 FE) MG PO TABS
325.0000 mg | ORAL_TABLET | Freq: Three times a day (TID) | ORAL | Status: DC
  Administered 2013-07-23 – 2013-07-24 (×3): 325 mg via ORAL
  Filled 2013-07-23 (×7): qty 1

## 2013-07-23 MED ORDER — ONDANSETRON HCL 4 MG/2ML IJ SOLN: 4.0000 mg | Freq: Four times a day (QID) | INTRAMUSCULAR | Status: DC | PRN

## 2013-07-23 MED ORDER — POTASSIUM CHLORIDE IN NACL 20-0.9 MEQ/L-% IV SOLN
INTRAVENOUS | Status: DC
  Filled 2013-07-23: qty 1000

## 2013-07-23 MED ORDER — PANTOPRAZOLE SODIUM 40 MG PO TBEC
40.0000 mg | DELAYED_RELEASE_TABLET | Freq: Every day | ORAL | Status: DC
  Administered 2013-07-24: 40 mg via ORAL
  Filled 2013-07-23: qty 1

## 2013-07-23 MED ORDER — SORBITOL 70 % SOLN
30.0000 mL | Freq: Every day | Status: DC | PRN
  Filled 2013-07-23: qty 30

## 2013-07-23 MED ORDER — CARVEDILOL 25 MG PO TABS
25.0000 mg | ORAL_TABLET | Freq: Two times a day (BID) | ORAL | Status: DC
  Administered 2013-07-23 – 2013-07-24 (×2): 25 mg via ORAL
  Filled 2013-07-23 (×4): qty 1

## 2013-07-23 MED ORDER — VITAMIN D 50 MCG (2000 UT) PO CAPS: 2000.0000 [IU] | ORAL_CAPSULE | Freq: Every day | ORAL | Status: DC

## 2013-07-23 MED ORDER — ACETAMINOPHEN 650 MG RE SUPP: 650.0000 mg | Freq: Four times a day (QID) | RECTAL | Status: DC | PRN

## 2013-07-23 MED ORDER — POTASSIUM CHLORIDE 20 MEQ PO PACK
40.0000 meq | PACK | Freq: Two times a day (BID) | ORAL | Status: DC
  Filled 2013-07-23 (×2): qty 2

## 2013-07-23 MED ORDER — OMEGA-3-ACID ETHYL ESTERS 1 G PO CAPS
1.0000 g | ORAL_CAPSULE | Freq: Every day | ORAL | Status: DC
  Administered 2013-07-24: 1 g via ORAL
  Filled 2013-07-23: qty 1

## 2013-07-23 MED ORDER — CALCIUM POLYCARBOPHIL 625 MG PO TABS
1250.0000 mg | ORAL_TABLET | Freq: Every day | ORAL | Status: DC
  Administered 2013-07-24: 10:00:00 via ORAL
  Filled 2013-07-23: qty 2

## 2013-07-23 MED ORDER — CANAGLIFLOZIN 300 MG PO TABS: 1.0000 | ORAL_TABLET | Freq: Every day | ORAL | Status: DC

## 2013-07-23 MED ORDER — ACETAMINOPHEN 325 MG PO TABS: 650.0000 mg | ORAL_TABLET | Freq: Four times a day (QID) | ORAL | Status: DC | PRN

## 2013-07-23 MED ORDER — DIPHENHYDRAMINE HCL 50 MG/ML IJ SOLN
12.5000 mg | Freq: Once | INTRAMUSCULAR | Status: AC
  Administered 2013-07-23: 12.5 mg via INTRAVENOUS
  Filled 2013-07-23: qty 1

## 2013-07-23 NOTE — Progress Notes (Signed)
VASCULAR LAB PRELIMINARY  PRELIMINARY  PRELIMINARY  PRELIMINARY  Carotid duplex  completed.    Preliminary report:  No significant ICA plaque or stenosis noted bilaterally.  Vertebral artery flow antegrade.    Margarette Canada, RVT 07/23/2013, 1:55 PM

## 2013-07-23 NOTE — H&P (Signed)
[name] [name] is an 63 y.o. male.   Chief Complaint: severe headache HPI:  Patient is a 63 year old Caucasian man who is well-known to me. He has several medical problems as described below. He also has a history of occasional migraine headaches for which he takes imipramine. He was in his usual state of good health until about 1900 hours on April 12 when he had a sharp stabbing occipital pain that last about 30 seconds and resolved with taking Tylenol. He slept well that night and did not have problems throughout the day on Monday. Today at approximately 3 AM he awoke and went to work (his usual time for going to work) and noted a mild headache at work. At about 4:30 AM he had a sudden onset of stabbing occipital pain of severe intensity associated with dizziness, difficulty remembering objects, and mild gait unsteadiness, so he presented to the emergency room for evaluation. He had nausea but no vomiting and had not had recent fever, chills, or falls. He is on Xarelto because of paroxysmal atrial fibrillation and has not had any unusual bleeding. Workup in the emergency room included normal vital signs and a nonfocal neurologic exam, with a head CT scan done without contrast that was unremarkable as well as a lumbar puncture that showed no evidence of a CNS bleed. He was given medication for nausea and has been asleep since that time. Currently, he was able to briefly awaken for exam and indicated that his headache had resolved. While in the ER his case was discussed with a neurologist who felt that admission was warranted for evaluation of atypical transient ischemic attack. Of note, he has obstructive sleep apnea for which CPAP has been recommended, but not used because of intolerance. He has no history of caffeine abuse. Past Medical History  Diagnosis Date  . Hypertension   . PAF (paroxysmal atrial fibrillation)     a. failed DCCV and multiple anti-arrhythmic drugs (multaq/flecainide);   b. s/p  PVI  isolation with ablation of AFib 8/13; c. repeat PVI 01/2013;  d. 02/2013 repeat DCCV->Amio load/xarelto.  . Renal calculi   . Chronic combined systolic and diastolic CHF (congestive heart failure)     a. LVEF previously 35% felt to be due tachycardia;  b. 02/2013 Echo: EF 50-55%, no rwma, mildly dil LA/RA, mild to mod MR.  . Diabetes mellitus   . Obesities, morbid   . Hypothyroid   . Gastric ulcer     s/p prior surgery  . Diverticulosis   . Erectile dysfunction   . Obstructive sleep apnea     noncompliant with CPAP  . Atrial flutter     a. s/p RFCA 8/13  . CKD (chronic kidney disease), stage III     hx of a/c renal failure during episode of diverticulitis 9/13 in [place], [place]  . Anemia     Medications Prior to Admission  Medication Sig Dispense Refill  . amiodarone (PACERONE) 200 MG tablet Take 1 tablet (200 mg total) by mouth daily.  90 tablet  3  . Canagliflozin (INVOKANA) 300 MG TABS Take 1 tablet by mouth daily.      . carvedilol (COREG) 25 MG tablet Take 25 mg by mouth 2 (two) times daily with a meal.      . Cholecalciferol (VITAMIN D) 2000 UNITS CAPS Take 2,000 Units by mouth daily.      . ferrous sulfate 325 (65 FE) MG tablet Take 1 tablet (325 mg total) by mouth 3 (three) times  daily with meals.  90 tablet  1  . furosemide (LASIX) 40 MG tablet Take 0.5 tablets (20 mg total) by mouth daily.  30 tablet  6  . GLUCOSAMINE PO Take 1,000 mg by mouth daily.       Marland Kitchen imipramine (TOFRANIL) 25 MG tablet Take 50 mg by mouth at bedtime.       Marland Kitchen levothyroxine (SYNTHROID, LEVOTHROID) 200 MCG tablet Take 200 mcg by mouth daily.      . Multiple Vitamins-Minerals (MENS 50+ MULTI VITAMIN/MIN PO) Take 1 tablet by mouth daily.      . Omega-3 Fatty Acids (FISH OIL) 1000 MG CAPS Take 1,000 capsules by mouth daily.       . pantoprazole (PROTONIX) 40 MG tablet Take 40 mg by mouth daily.       . polycarbophil (FIBERCON) 625 MG tablet Take 1,250 mg by mouth daily.      . potassium chloride  (KLOR-CON) 20 MEQ packet Take 40 mEq by mouth 2 (two) times daily.      . pravastatin (PRAVACHOL) 20 MG tablet Take 10 mg by mouth at bedtime.       . Rivaroxaban (XARELTO) 15 MG TABS tablet Take 15 mg by mouth daily.      Marland Kitchen zolpidem (AMBIEN) 10 MG tablet Take 1 tablet by mouth daily as needed for sleep.         ADDITIONAL HOME MEDICATIONS: no additional meds  PHYSICIANS INVOLVED IN CARE: Leanna Battles (PCP), Thompson Grayer (cardiology), Howell Rucks (GI), Jenkins Rouge (card), Kathie Rhodes (urol), Armandina Gemma (gsurg)  Past Surgical History  Procedure Laterality Date  . Wrist sx  1980    right  . Knee sx  1985    both  . Benign stomach tumor removal  2000  . Thyroidectomy  2009    cysts removal   . Laparoscopic retropubic prostatectomy  02/2007    hx prostate cancer  . Kidney stone removal      multiple  . Tee without cardioversion  08/25/2011    Procedure: TRANSESOPHAGEAL ECHOCARDIOGRAM (TEE);  Surgeon: Jettie Booze, MD;  Location: [place];  Service: Cardiovascular;  Laterality: N/A;  . Cardioversion  08/25/2011    Procedure: CARDIOVERSION;  Surgeon: Jettie Booze, MD;  Location: [place];  Service: Cardiovascular;  Laterality: N/A;  . Tee without cardioversion  11/09/2011    Procedure: TRANSESOPHAGEAL ECHOCARDIOGRAM (TEE);  Surgeon: Jettie Booze, MD;  Location: [facility] ENDOSCOPY;  Service: Cardiovascular;  Laterality: N/A;  h/p in file drawer  . Atrial fibrillation ablation  12/06/11; 02/05/2013    Afib and atrial flutter ablation by Dr Rayann Heman; repeat PVI by Dr Rayann Heman 02/05/2013  . Tee without cardioversion N/A 02/05/2013    Procedure: TRANSESOPHAGEAL ECHOCARDIOGRAM (TEE);  Surgeon: Candee Furbish, MD;  Location: [facility] ENDOSCOPY;  Service: Cardiovascular;  Laterality: N/A;  . Cardioversion N/A 03/05/2013    Procedure: CARDIOVERSION;  Surgeon: Sinclair Grooms, MD;  Location: [place];  Service: Cardiovascular;  Laterality: N/A;  BEDSIDE   .  Esophagogastroduodenoscopy N/A 04/09/2013    Procedure: ESOPHAGOGASTRODUODENOSCOPY (EGD) with possible Balloon Dilation;  Surgeon: Garlan Fair, MD;  Location: [facility] ENDOSCOPY;  Service: Endoscopy;  Laterality: N/A;    Family History  Problem Relation Age of Onset  . Emphysema Mother   . Lung cancer Father   . Diabetes Brother   . Colon cancer Neg Hx      Social History:  reports that he has never smoked. He does not have any smokeless  tobacco history on file. He reports that he does not drink alcohol or use illicit drugs.  Allergies:  Allergies  Allergen Reactions  . Phenergan [Promethazine Hcl] Itching    Hallucination   . Aspirin     Hx of ulcer   . Bystolic [Nebivolol Hcl]     Bradycardia   . Calcium Channel Blockers     LE edema  . Prednisone     Hx of ulcer  . Nsaids     Hx ulcer     ROS: asthma, diabetes, heart palpitation, high blood pressure and kidney disease  PHYSICAL EXAM: Blood pressure 124/65, pulse 47, temperature 97.3 F (36.3 C), temperature source Oral, resp. rate 20, SpO2 100.00%. In general, the patient is a mildly overweight white man who initially was asleep, but was easily awoken briefly while lying supine in bed. HEENT exam was significant for very mild ptosis on the left side with normal extraocular eye movements. Neck was supple without jugular venous distention or carotid bruit, chest was clear to auscultation, heart had a regular rate and rhythm that was somewhat slow and without murmur or gallop, abdomen had normal bowel sounds no hepatosplenomegaly or tenderness, extremities were without cyanosis, clubbing, or edema and the pedal pulses were normal. Neurologic exam: He was alert oriented x3, cranial nerves II through XII were normal with the exception of slight ptosis on the left side, sensory exam was grossly normal, he was able to move all extremities well, bilateral finger to nose testing was normal, gait was not assessed.  Results for  orders placed during the hospital encounter of 07/23/13 (from the past 48 hour(s))  CBC WITH DIFFERENTIAL     Status: Abnormal   Collection Time    07/23/13  5:36 AM      Result Value Ref Range   WBC 7.0  4.0 - 10.5 K/uL   RBC 4.35  4.22 - 5.81 MIL/uL   Hemoglobin 13.8  13.0 - 17.0 g/dL   HCT 40.5  39.0 - 52.0 %   MCV 93.1  78.0 - 100.0 fL   MCH 31.7  26.0 - 34.0 pg   MCHC 34.1  30.0 - 36.0 g/dL   RDW 15.8 (*) 11.5 - 15.5 %   Platelets 209  150 - 400 K/uL   Neutrophils Relative % 65  43 - 77 %   Neutro Abs 4.5  1.7 - 7.7 K/uL   Lymphocytes Relative 23  12 - 46 %   Lymphs Abs 1.6  0.7 - 4.0 K/uL   Monocytes Relative 10  3 - 12 %   Monocytes Absolute 0.7  0.1 - 1.0 K/uL   Eosinophils Relative 2  0 - 5 %   Eosinophils Absolute 0.2  0.0 - 0.7 K/uL   Basophils Relative 0  0 - 1 %   Basophils Absolute 0.0  0.0 - 0.1 K/uL  BASIC METABOLIC PANEL     Status: Abnormal   Collection Time    07/23/13  5:36 AM      Result Value Ref Range   Sodium 143  137 - 147 mEq/L   Potassium 4.0  3.7 - 5.3 mEq/L   Chloride 106  96 - 112 mEq/L   CO2 21  19 - 32 mEq/L   Glucose, Bld 180 (*) 70 - 99 mg/dL   BUN 19  6 - 23 mg/dL   Creatinine, Ser 1.35  0.50 - 1.35 mg/dL   Calcium 9.2  8.4 - 10.5 mg/dL   GFR  calc non Af Amer 54 (*) >90 mL/min   GFR calc Af Amer 63 (*) >90 mL/min   Comment: (NOTE)     The eGFR has been calculated using the CKD EPI equation.     This calculation has not been validated in all clinical situations.     eGFR's persistently <90 mL/min signify possible Chronic Kidney     Disease.  CSF CELL COUNT WITH DIFFERENTIAL     Status: None   Collection Time    07/23/13  7:34 AM      Result Value Ref Range   Tube # 1     Color, CSF COLORLESS  COLORLESS   Appearance, CSF CLEAR  CLEAR   Supernatant NOT INDICATED     RBC Count, CSF 0  0 /cu mm   WBC, CSF 1  0 - 5 /cu mm   Segmented Neutrophils-CSF TOO FEW TO COUNT, SMEAR AVAILABLE FOR REVIEW  0 - 6 %   Lymphs, CSF RARE  40 - 80 %   CSF CELL COUNT WITH DIFFERENTIAL     Status: None   Collection Time    07/23/13  7:34 AM      Result Value Ref Range   Tube # 4     Color, CSF COLORLESS  COLORLESS   Appearance, CSF CLEAR  CLEAR   Supernatant NOT INDICATED     RBC Count, CSF 0  0 /cu mm   WBC, CSF 0  0 - 5 /cu mm   Segmented Neutrophils-CSF TOO FEW TO COUNT, SMEAR AVAILABLE FOR REVIEW  0 - 6 %   Lymphs, CSF RARE  40 - 80 %   Monocyte-Macrophage-Spinal Fluid RARE  15 - 45 %  GRAM STAIN     Status: None   Collection Time    07/23/13  7:34 AM      Result Value Ref Range   Specimen Description CSF NO 2 1.75CC     Special Requests CSF FLUID NO 2 1.75CC     Gram Stain       Value: CSF  CYTOSPIN     NO WBC SEEN     NO ORGANISMS SEEN   Report Status 07/23/2013 FINAL    GLUCOSE, CSF     Status: Abnormal   Collection Time    07/23/13  7:34 AM      Result Value Ref Range   Glucose, CSF 91 (*) 43 - 76 mg/dL  PROTEIN, CSF     Status: None   Collection Time    07/23/13  7:34 AM      Result Value Ref Range   Total  Protein, CSF 25  15 - 45 mg/dL   Ct Head Wo Contrast  07/23/2013   CLINICAL DATA:  Headache.  EXAM: CT HEAD WITHOUT CONTRAST  TECHNIQUE: Contiguous axial images were obtained from the base of the skull through the vertex without intravenous contrast.  COMPARISON:  CT HEAD W/O CM dated 01/09/2011  FINDINGS: Moderately motion degraded examination.  The ventricles and sulci are normal for age. No intraparenchymal hemorrhage, mass effect nor midline shift. Patchy supratentorial white matter hypodensities are within normal range for patient's age and though non-specific suggest sequelae of chronic small vessel ischemic disease. No acute large vascular territory infarcts.  No abnormal extra-axial fluid collections. Basal cisterns are patent.  No skull fracture. The included ocular globes and orbital contents are non-suspicious. The mastoid aircells and included paranasal sinuses are well-aerated.  IMPRESSION: Moderately  motion degraded examination without  acute intracranial process.   Electronically Signed   By: Elon Alas   On: 07/23/2013 05:55     Assessment/Plan #1 Headache: Moderately severe and of uncertain cause. This could simply be a common migraine headache, and is less likely from a CNS aneurysm given CT findings and the LP results. Transient ischemic attack is less likely, but possible, so he'll be admitted for workup to include carotid ultrasound and MRI of the brain with MRA of the brain. He has had regular echocardiograms with his cardiologist with last study done on 03/05/2013 and showing fairly normal LV systolic function with mitral regurgitation and no source for embolism. If his workup is unremarkable today then he could be discharged later in the day. Even if transient atrial fibrillation is detected with telemetry the management would not change as he would remain on Xarelto. We will help ensure resolution of his headache with a dose of prednisone today. We'll consider increasing his dose of imipramine. #2 HTN:  Stable on current medications. His heart rate runs somewhat low, but he has not had persistent issues with lightheadedness despite his low heart rate. #3 DM2: Stable on current medications   Leanna Battles 07/23/2013, 9:48 AM

## 2013-07-23 NOTE — ED Notes (Signed)
Dr. Leonides Schanz EDP into room.

## 2013-07-23 NOTE — ED Notes (Signed)
Pt to CT, no changes.  

## 2013-07-23 NOTE — ED Notes (Signed)
Dr. Leonides Schanz at [facility]. Pt sleeping, arousable to voice. Explaining risks and benefits and plan for LP.

## 2013-07-23 NOTE — ED Notes (Signed)
Patient began having a sharp pain in the back of his head which he described as "it felt like someone was stabbing me with an ice pick." Patient went to bed and woke up this morning and states "I felt like there was an explosion in my head." Patient is reporting dizziness and trouble remembering names of objects. Patient answered all questions appropriately at this time. Pupils are PERRLA. States he has nausea, but no vomiting.

## 2013-07-23 NOTE — ED Provider Notes (Addendum)
TIME SEEN: 5:20 AM  CHIEF COMPLAINT: Headache  HPI: Patient is a 63 year old male with a history of migraine headaches, hypertension, A. fib on Xarelto, CHF, diabetes, hypothyroidism, chronic kidney disease who presents to the emergency department with intermittent headaches for the past several days. He describes his headache yesterday as a "explosion" and felt like someone was stabbing him. He reports yesterday he had vertigo, trouble remembering names of objects, unsteady gait. He reports he has had intermittent headaches that are different than his prior migraines. No history of head injury. No fever, neck pain or neck stiffness. No current numbness, tingling or focal weakness. He has had nausea but no vomiting. He denies that he's ever had this headache before. No history of CVA or TIA.  ROS: See HPI Constitutional: no fever  Eyes: no drainage  ENT: no runny nose   Cardiovascular:  no chest pain  Resp: no SOB  GI: no vomiting GU: no dysuria Integumentary: no rash  Allergy: no hives  Musculoskeletal: no leg swelling  Neurological: no slurred speech ROS otherwise negative  PAST MEDICAL HISTORY/PAST SURGICAL HISTORY:  Past Medical History  Diagnosis Date  . Hypertension   . PAF (paroxysmal atrial fibrillation)     a. failed DCCV and multiple anti-arrhythmic drugs (multaq/flecainide);   b. s/p  PVI isolation with ablation of AFib 8/13; c. repeat PVI 01/2013;  d. 02/2013 repeat DCCV->Amio load/xarelto.  . Renal calculi   . Chronic combined systolic and diastolic CHF (congestive heart failure)     a. LVEF previously 35% felt to be due tachycardia;  b. 02/2013 Echo: EF 50-55%, no rwma, mildly dil LA/RA, mild to mod MR.  . Diabetes mellitus   . Obesities, morbid   . Hypothyroid   . Gastric ulcer     s/p prior surgery  . Diverticulosis   . Erectile dysfunction   . Obstructive sleep apnea     noncompliant with CPAP  . Atrial flutter     a. s/p RFCA 8/13  . CKD (chronic kidney  disease), stage III     hx of a/c renal failure during episode of diverticulitis 9/13 in [place], [place]  . Anemia     MEDICATIONS:  Prior to Admission medications   Medication Sig Start Date End Date Taking? Authorizing Provider  amiodarone (PACERONE) 200 MG tablet Take 1 tablet (200 mg total) by mouth daily. 05/06/13   Thompson Grayer, MD  carvedilol (COREG) 25 MG tablet Take 25 mg by mouth 2 (two) times daily with a meal.    Historical Provider, MD  Cholecalciferol (VITAMIN D) 2000 UNITS CAPS Take 2,000 Units by mouth daily.    Historical Provider, MD  ferrous sulfate 325 (65 FE) MG tablet Take 1 tablet (325 mg total) by mouth 3 (three) times daily with meals. 02/01/13   Thompson Grayer, MD  furosemide (LASIX) 40 MG tablet Take 0.5 tablets (20 mg total) by mouth daily. 05/06/13   Thompson Grayer, MD  GLUCOSAMINE PO Take 1,000 mg by mouth daily.     Historical Provider, MD  imipramine (TOFRANIL) 25 MG tablet Take 50 mg by mouth at bedtime.     Historical Provider, MD  levothyroxine (SYNTHROID, LEVOTHROID) 200 MCG tablet Take 200 mcg by mouth daily.    Historical Provider, MD  Multiple Vitamins-Minerals (MENS 50+ MULTI VITAMIN/MIN PO) Take 1 tablet by mouth daily.    Historical Provider, MD  Omega-3 Fatty Acids (FISH OIL) 1000 MG CAPS Take 1,000 capsules by mouth daily.     Historical  Provider, MD  pantoprazole (PROTONIX) 40 MG tablet Take 40 mg by mouth daily.     Historical Provider, MD  polycarbophil (FIBERCON) 625 MG tablet Take 1,250 mg by mouth daily.    Historical Provider, MD  potassium chloride SA (K-DUR,KLOR-CON) 20 MEQ tablet 40 MeQ in the AM and 40 MeQ in the PM 05/16/13   Thompson Grayer, MD  pravastatin (PRAVACHOL) 20 MG tablet Take 10 mg by mouth at bedtime.     Historical Provider, MD  Rivaroxaban (XARELTO) 15 MG TABS tablet Take 15 mg by mouth daily.    Historical Provider, MD  zolpidem (AMBIEN) 10 MG tablet Take 1 tablet by mouth daily as needed. 04/12/13   Historical Provider, MD     ALLERGIES:  Allergies  Allergen Reactions  . Phenergan [Promethazine Hcl] Itching    Hallucination   . Aspirin     Hx of ulcer   . Bystolic [Nebivolol Hcl]     Bradycardia   . Calcium Channel Blockers     LE edema  . Prednisone     Hx of ulcer  . Nsaids     Hx ulcer    SOCIAL HISTORY:  History  Substance Use Topics  . Smoking status: Never Smoker   . Smokeless tobacco: Not on file  . Alcohol Use: No    FAMILY HISTORY: Family History  Problem Relation Age of Onset  . Emphysema Mother   . Lung cancer Father   . Diabetes Brother   . Colon cancer Neg Hx     EXAM: BP 118/74  Pulse 55  Temp(Src) 98.6 F (37 C) (Oral)  Resp 19  SpO2 97% CONSTITUTIONAL: Alert and oriented and responds appropriately to questions. Well-appearing; well-nourished HEAD: Normocephalic EYES: Conjunctivae clear, PERRL ENT: normal nose; no rhinorrhea; moist mucous membranes; pharynx without lesions noted NECK: Supple, no meningismus, no LAD  CARD: RRR; S1 and S2 appreciated; no murmurs, no clicks, no rubs, no gallops RESP: Normal chest excursion without splinting or tachypnea; breath sounds clear and equal bilaterally; no wheezes, no rhonchi, no rales,  ABD/GI: Normal bowel sounds; non-distended; soft, non-tender, no rebound, no guarding BACK:  The back appears normal and is non-tender to palpation, there is no CVA tenderness EXT: Normal ROM in all joints; non-tender to palpation; no edema; normal capillary refill; no cyanosis    SKIN: Normal color for age and race; warm NEURO: Moves all extremities equally, strength 5/5 in all 4 extremity, sensation to light touch intact diffusely, cranial nerves II through XII intact PSYCH: The patient's mood and manner are appropriate. Grooming and personal hygiene are appropriate.  MEDICAL DECISION MAKING: Patient here with sudden onset headache this feels different than his prior migraines with associated neurologic deficits. Concern for  subarachnoid hemorrhage. We'll obtain labs and CT scan of patient's head. If CT negative, patient will need lumbar puncture. We'll give IV fluids, Compazine and reassess.  ED PROGRESS: Patient reports his headache is improved after IV Compazine but is not completely gone. His head CT shows no intracranial hemorrhage is limited due to motion artifact. Labs are unremarkable. Have consented patient and wife at bedside for lumbar puncture.   7:40 AM  LP performed with patient sitting upright. Successful with one attempt. Patient's CSF was completely clear. No concern for intracranial hemorrhage at this time. Differential includes complicated migraine versus TIA. Discussed with Dr. Doy Mince with neurology who will see the patient in consult and agrees with medicine admission for TIA workup.  PCP is Leanna Battles.  EKG Interpretation  Date/Time:  Tuesday July 23 2013 05:13:53 EDT Ventricular Rate:  56 PR Interval:  166 QRS Duration: 103 QT Interval:  438 QTC Calculation: 423 R Axis:   -20 Text Interpretation:  Age not entered, assumed to be  63 years old for purpose of ECG interpretation Sinus rhythm Abnormal R-wave progression, late transition Left ventricular hypertrophy No significant change since last tracing Confirmed by WARD,  DO, KRISTEN [phone]) on 07/23/2013 5:20:37 AM      LUMBAR PUNCTURE Date/Time: 07/23/2013 7:41 AM Performed by: Nyra Jabs Authorized by: Nyra Jabs Consent: written consent obtained. Risks and benefits: risks, benefits and alternatives were discussed Consent given by: patient Patient understanding: patient states understanding of the procedure being performed Patient consent: the patient's understanding of the procedure matches consent given Procedure consent: procedure consent matches procedure scheduled Relevant documents: relevant documents present and verified Test results: test results available and properly labeled Patient identity confirmed:  verbally with patient and arm band Time out: Immediately prior to procedure a "time out" was called to verify the correct patient, procedure, equipment, support staff and site/side marked as required. Indications: evaluation for subarachnoid hemorrhage Anesthesia: local infiltration Local anesthetic: lidocaine 1% without epinephrine Anesthetic total: 5 ml Patient sedated: no Preparation: Patient was prepped and draped in the usual sterile fashion. Lumbar space: L3-L4 interspace Patient's position: sitting Needle gauge: 18 Needle type: spinal needle - Quincke tip Needle length: 1.5 in Number of attempts: 1 Fluid appearance: clear Tubes of fluid: 4 Total volume: 7 ml Post-procedure: site cleaned and adhesive bandage applied Patient tolerance: Patient tolerated the procedure well with no immediate complications.     [place], DO 07/23/13 [place], DO 07/23/13 [place], DO 07/23/13 [phone]

## 2013-07-23 NOTE — ED Notes (Signed)
C/o L parietal HA, nausea and dizziness. (denies: visual or hearing changes, vomiting, sob, other pains or other sx). PERRL, MAEx4, facial symmetry, grip strength and coordination are strong & equal.

## 2013-07-23 NOTE — Progress Notes (Signed)
UR Completed Calixto Pavel Graves-Bigelow, RN,BSN [phone]  

## 2013-07-23 NOTE — Progress Notes (Signed)
  Echocardiogram 2D Echocardiogram has been performed.  [name] [name] 07/23/2013, 1:34 PM

## 2013-07-23 NOTE — ED Notes (Signed)
Pt sleeping, wife at [facility], no changes.

## 2013-07-23 NOTE — Consult Note (Signed)
Referring Physician: Philip Aspen    Chief Complaint: HA associated with blurred vision, nausea   HPI:                                                                                                                                         [name] [name] is an 63 y.o. male who has been seen by a neurologist 10-15 years ago (in [place]) for very similar reason.  At that time he had what sounds like a stroke work up which "was negative".  At that time he was started on daily imipramine with thoughts it may have been a migraine HA.  He states his usual migraine HA is in the left front and throbbing--not similar to this event.   On Sunday night he was normal when suddenly he noted a intense pain in the back left portion of his head followed by a hot sensation in the left aspect of his face. This lasted for about 30 seconds and dissipated.  HE was fine all day Monday.  This AM he awoke at approximately 3 AM he awoke and went to work (his usual time for going to work).  Upon waking he noted he felt dizzy, he had "double vision and blurred vision" and did not feel right. He arrived at work and noted he had a mild unsteady gait and was very slow to process things.  Between 3-4:30 he noted a gradual HA in the left posterior aspect of his head which was 6/10 at maximum intensity. He was brought to ED where LP was obtained. Glucose 91, protein 25, RBC 0, WBC 1, neutrophils 0, lymph's rare, clear, colorless, gram stain (-) and culture pending. CT head negative. Currently he feels back to his baseline. He was unable to get up and walk due to recent LP.    Date last known well: Date: 07/23/2013 Time last known well: Time: 03:00 tPA Given: No: resolution of symptoms and on Xeralto.   Past Medical History  Diagnosis Date  . Hypertension   . PAF (paroxysmal atrial fibrillation)     a. failed DCCV and multiple anti-arrhythmic drugs (multaq/flecainide);   b. s/p  PVI isolation with ablation of AFib 8/13; c. repeat PVI  01/2013;  d. 02/2013 repeat DCCV->Amio load/xarelto.  . Renal calculi   . Chronic combined systolic and diastolic CHF (congestive heart failure)     a. LVEF previously 35% felt to be due tachycardia;  b. 02/2013 Echo: EF 50-55%, no rwma, mildly dil LA/RA, mild to mod MR.  . Diabetes mellitus   . Obesities, morbid   . Hypothyroid   . Gastric ulcer     s/p prior surgery  . Diverticulosis   . Erectile dysfunction   . Obstructive sleep apnea     noncompliant with CPAP  . Atrial flutter     a. s/p RFCA 8/13  . CKD (chronic kidney disease), stage  III     hx of a/c renal failure during episode of diverticulitis 9/13 in [place], [place]  . Anemia     Past Surgical History  Procedure Laterality Date  . Wrist sx  1980    right  . Knee sx  1985    both  . Benign stomach tumor removal  2000  . Thyroidectomy  2009    cysts removal   . Laparoscopic retropubic prostatectomy  02/2007    hx prostate cancer  . Kidney stone removal      multiple  . Tee without cardioversion  08/25/2011    Procedure: TRANSESOPHAGEAL ECHOCARDIOGRAM (TEE);  Surgeon: Jettie Booze, MD;  Location: [facility];  Service: Cardiovascular;  Laterality: N/A;  . Cardioversion  08/25/2011    Procedure: CARDIOVERSION;  Surgeon: Jettie Booze, MD;  Location: [facility];  Service: Cardiovascular;  Laterality: N/A;  . Tee without cardioversion  11/09/2011    Procedure: TRANSESOPHAGEAL ECHOCARDIOGRAM (TEE);  Surgeon: Jettie Booze, MD;  Location: [facility] ENDOSCOPY;  Service: Cardiovascular;  Laterality: N/A;  h/p in file drawer  . Atrial fibrillation ablation  12/06/11; 02/05/2013    Afib and atrial flutter ablation by Dr Rayann Heman; repeat PVI by Dr Rayann Heman 02/05/2013  . Tee without cardioversion N/A 02/05/2013    Procedure: TRANSESOPHAGEAL ECHOCARDIOGRAM (TEE);  Surgeon: Candee Furbish, MD;  Location: [facility] ENDOSCOPY;  Service: Cardiovascular;  Laterality: N/A;  . Cardioversion N/A 03/05/2013    Procedure: CARDIOVERSION;   Surgeon: Sinclair Grooms, MD;  Location: [facility];  Service: Cardiovascular;  Laterality: N/A;  BEDSIDE   . Esophagogastroduodenoscopy N/A 04/09/2013    Procedure: ESOPHAGOGASTRODUODENOSCOPY (EGD) with possible Balloon Dilation;  Surgeon: Garlan Fair, MD;  Location: [facility] ENDOSCOPY;  Service: Endoscopy;  Laterality: N/A;    Family History  Problem Relation Age of Onset  . Emphysema Mother   . Lung cancer Father   . Diabetes Brother   . Colon cancer Neg Hx    Social History:  reports that he has never smoked. He does not have any smokeless tobacco history on file. He reports that he does not drink alcohol or use illicit drugs.  Allergies:  Allergies  Allergen Reactions  . Phenergan [Promethazine Hcl] Itching    Hallucination   . Aspirin     Hx of ulcer   . Bystolic [Nebivolol Hcl]     Bradycardia   . Calcium Channel Blockers     LE edema  . Prednisone     Hx of ulcer  . Nsaids     Hx ulcer    Medications:                                                                                                                           Scheduled: . [START ON 07/24/2013] amiodarone  200 mg Oral Daily  . Canagliflozin  1 tablet Oral Daily  . carvedilol  25 mg Oral BID WC  . cholecalciferol  2,000 Units Oral  Daily  . ferrous sulfate  325 mg Oral TID WC  . [START ON 07/24/2013] furosemide  20 mg Oral Daily  . imipramine  50 mg Oral QHS  . [START ON 07/24/2013] levothyroxine  200 mcg Oral QAC breakfast  . [START ON 07/24/2013] omega-3 acid ethyl esters  1 g Oral Daily  . [START ON 07/24/2013] pantoprazole  40 mg Oral Daily  . [START ON 07/24/2013] polycarbophil  1,250 mg Oral Daily  . potassium chloride  40 mEq Oral BID  . predniSONE  30 mg Oral Q breakfast  . [START ON 07/24/2013] Rivaroxaban  15 mg Oral Q supper  . simvastatin  10 mg Oral q1800  . sodium chloride  3 mL Intravenous Q12H    ROS:                                                                                                                                        History obtained from the patient  General ROS: negative for - chills, fatigue, fever, night sweats, weight gain or weight loss Psychological ROS: negative for - behavioral disorder, hallucinations, memory difficulties, mood swings or suicidal ideation Ophthalmic ROS: negative for - blurry vision, double vision, eye pain or loss of vision ENT ROS: negative for - epistaxis, nasal discharge, oral lesions, sore throat, tinnitus or vertigo Allergy and Immunology ROS: negative for - hives or itchy/watery eyes Hematological and Lymphatic ROS: negative for - bleeding problems, bruising or swollen lymph nodes Endocrine ROS: negative for - galactorrhea, hair pattern changes, polydipsia/polyuria or temperature intolerance Respiratory ROS: negative for - cough, hemoptysis, shortness of breath or wheezing Cardiovascular ROS: negative for - chest pain, dyspnea on exertion, edema or irregular heartbeat Gastrointestinal ROS: negative for - abdominal pain, diarrhea, hematemesis, nausea/vomiting or stool incontinence Genito-Urinary ROS: negative for - dysuria, hematuria, incontinence or urinary frequency/urgency Musculoskeletal ROS: negative for - joint swelling or muscular weakness Neurological ROS: as noted in HPI Dermatological ROS: negative for rash and skin lesion changes  Neurologic Examination:                                                                                                      Blood pressure 124/65, pulse 47, temperature 97.3 F (36.3 C), temperature source Oral, resp. rate 20, SpO2 100.00%.   Mental Status: Alert, oriented, thought process slightly slow.  Speech fluent without evidence of aphasia.  Able to follow 3 step commands without difficulty. Cranial Nerves: II: Discs flat bilaterally; Visual fields grossly  normal, pupils equal, round, reactive to light and accommodation III,IV, VI: ptosis not present, extra-ocular motions intact  bilaterally V,VII: smile symmetric, facial light touch sensation normal bilaterally VIII: hearing normal bilaterally IX,X: gag reflex present XI: bilateral shoulder shrug XII: midline tongue extension without atrophy or fasciculations  Motor: Right : Upper extremity   5/5    Left:     Upper extremity   5/5  Lower extremity   5/5     Lower extremity   5/5 Tone and bulk:normal tone throughout; no atrophy noted Sensory: Pinprick and light touch intact throughout, bilaterally Deep Tendon Reflexes:  Right: Upper Extremity   Left: Upper extremity   biceps (C-5 to C-6) 2/4   biceps (C-5 to C-6) 2/4 tricep (C7) 2/4    triceps (C7) 2/4 Brachioradialis (C6) 2/4  Brachioradialis (C6) 2/4  Lower Extremity Lower Extremity  quadriceps (L-2 to L-4) 2/4   quadriceps (L-2 to L-4) 2/4 Achilles (S1) 0/4   Achilles (S1) 0/4  Plantars: Right: downgoing   Left: downgoing Cerebellar: normal finger-to-nose,  normal heel-to-shin test Gait: not able to obtain--on bed rest post LP CV: pulses palpable throughout    Lab Results: Basic Metabolic Panel:  Recent Labs Lab 07/23/13 0536  NA 143  K 4.0  CL 106  CO2 21  GLUCOSE 180*  BUN 19  CREATININE 1.35  CALCIUM 9.2    Liver Function Tests: No results found for this basename: AST, ALT, ALKPHOS, BILITOT, PROT, ALBUMIN,  in the last 168 hours No results found for this basename: LIPASE, AMYLASE,  in the last 168 hours No results found for this basename: AMMONIA,  in the last 168 hours  CBC:  Recent Labs Lab 07/23/13 0536  WBC 7.0  NEUTROABS 4.5  HGB 13.8  HCT 40.5  MCV 93.1  PLT 209    Cardiac Enzymes: No results found for this basename: CKTOTAL, CKMB, CKMBINDEX, TROPONINI,  in the last 168 hours  Lipid Panel: No results found for this basename: CHOL, TRIG, HDL, CHOLHDL, VLDL, LDLCALC,  in the last 168 hours  CBG: No results found for this basename: GLUCAP,  in the last 168 hours  Microbiology: Results for orders placed during  the hospital encounter of 07/23/13  GRAM STAIN     Status: None   Collection Time    07/23/13  7:34 AM      Result Value Ref Range Status   Specimen Description CSF NO 2 1.75CC   Final   Special Requests CSF FLUID NO 2 1.75CC   Final   Gram Stain     Final   Value: CSF  CYTOSPIN     NO WBC SEEN     NO ORGANISMS SEEN   Report Status 07/23/2013 FINAL   Final     Coagulation Studies: No results found for this basename: LABPROT, INR,  in the last 72 hours  Imaging: Ct Head Wo Contrast  07/23/2013   CLINICAL DATA:  Headache.  EXAM: CT HEAD WITHOUT CONTRAST  TECHNIQUE: Contiguous axial images were obtained from the base of the skull through the vertex without intravenous contrast.  COMPARISON:  CT HEAD W/O CM dated 01/09/2011  FINDINGS: Moderately motion degraded examination.  The ventricles and sulci are normal for age. No intraparenchymal hemorrhage, mass effect nor midline shift. Patchy supratentorial white matter hypodensities are within normal range for patient's age and though non-specific suggest sequelae of chronic small vessel ischemic disease. No acute large vascular territory infarcts.  No abnormal extra-axial fluid collections. Basal  cisterns are patent.  No skull fracture. The included ocular globes and orbital contents are non-suspicious. The mastoid aircells and included paranasal sinuses are well-aerated.  IMPRESSION: Moderately motion degraded examination without acute intracranial process.   Electronically Signed   By: Elon Alas   On: 07/23/2013 05:55    Etta Quill PA-C [facility] [phone]  07/23/2013, 10:54 AM   Patient seen and examined.  Clinical course and management discussed.  Necessary edits performed.  I agree with the above.  Assessment and plan of care developed and discussed below.   Assessment: 63 y.o. male presenting with complaints of headache, diplopia and unsteady gait.  Head CT has been reviewed and shows no evidence of hemorrhage or  acute changes.  LP shows no evidence of a SAH either.  With the patient's multiple risk factors will rule out the possibility of an acute infarct.  Patient other wise likely with a complicated migraine and can be treated for such. Carotids doppler shows no significant stenosis.  Echocardiogram shows no intracardiac masses or thrombi.    Stroke Risk Factors - atrial fibrillation, diabetes mellitus and hypertension  Recommendations: 1.  Will follow up results of MRI of the brain and make further recommendations at that time.   2.  Patient to continue Xarelto.    Alexis Goodell, MD [facility] [phone]  07/23/2013  4:55 PM

## 2013-07-23 NOTE — Progress Notes (Signed)
Pt arrived to 3W05 via stretcher and on cardiac monitor from the ED accompanied by Nurse Tech. Pt asleep but awoke easily and was able to slide self over to hospital bed. Pt placed on telemetry monitor. TMU notified of new patient. Pt is a/o x4, denies pain or discomfort at this time. Vitals stable. Dr. Shon Baton office notified of patient's arrival. Awaiting admission orders. Will continue to monitor. Bed low and locked, side rails up x2, phone and call light within reach. Pt's wife currently at bedside. - Roselyn Reef Kynsleigh [place]f,RN

## 2013-07-24 ENCOUNTER — Observation Stay (HOSPITAL_COMMUNITY): Payer: Managed Care, Other (non HMO)

## 2013-07-24 LAB — COMPREHENSIVE METABOLIC PANEL
ALBUMIN: 3.1 g/dL — AB (ref 3.5–5.2)
ALK PHOS: 93 U/L (ref 39–117)
ALT: 21 U/L (ref 0–53)
AST: 17 U/L (ref 0–37)
BUN: 20 mg/dL (ref 6–23)
CALCIUM: 9.1 mg/dL (ref 8.4–10.5)
CO2: 21 mEq/L (ref 19–32)
Chloride: 107 mEq/L (ref 96–112)
Creatinine, Ser: 1.19 mg/dL (ref 0.50–1.35)
GFR calc Af Amer: 73 mL/min — ABNORMAL LOW (ref >90)
GFR calc non Af Amer: 63 mL/min — ABNORMAL LOW (ref >90)
Glucose, Bld: 153 mg/dL — ABNORMAL HIGH (ref 70–99)
Potassium: 4.4 mEq/L (ref 3.7–5.3)
SODIUM: 143 meq/L (ref 137–147)
TOTAL PROTEIN: 6.2 g/dL (ref 6.0–8.3)
Total Bilirubin: 0.4 mg/dL (ref 0.3–1.2)

## 2013-07-24 LAB — LIPID PANEL
CHOL/HDL RATIO: 3.5 ratio
Cholesterol: 181 mg/dL (ref 0–200)
HDL: 52 mg/dL (ref >39)
LDL CALC: 109 mg/dL — AB (ref 0–99)
Triglycerides: 99 mg/dL (ref <150)
VLDL: 20 mg/dL (ref 0–40)

## 2013-07-24 LAB — CBC
HEMATOCRIT: 41.1 % (ref 39.0–52.0)
HEMOGLOBIN: 13.8 g/dL (ref 13.0–17.0)
MCH: 32 pg (ref 26.0–34.0)
MCHC: 33.6 g/dL (ref 30.0–36.0)
MCV: 95.4 fL (ref 78.0–100.0)
Platelets: 203 10*3/uL (ref 150–400)
RBC: 4.31 MIL/uL (ref 4.22–5.81)
RDW: 15.5 % (ref 11.5–15.5)
WBC: 10.5 10*3/uL (ref 4.0–10.5)

## 2013-07-24 LAB — GLUCOSE, CAPILLARY
Glucose-Capillary: 117 mg/dL — ABNORMAL HIGH (ref 70–99)
Glucose-Capillary: 221 mg/dL — ABNORMAL HIGH (ref 70–99)

## 2013-07-24 MED ORDER — GADOBENATE DIMEGLUMINE 529 MG/ML IV SOLN
20.0000 mL | Freq: Once | INTRAVENOUS | Status: AC
  Administered 2013-07-24: 20 mL via INTRAVENOUS

## 2013-07-24 NOTE — Discharge Summary (Addendum)
Physician Discharge Summary  Patient ID: [name] [name] MRN: [id number] DOB/AGE: 04-22-1950 63 y.o.  Admit date: 07/23/2013 Discharge date: 07/24/2013   Discharge Diagnoses:  Principal Problem:   TIA (transient ischemic attack) Active Problems:   Atrial fibrillation   Headache   Obstructive sleep apnea   Hypertension   Discharged Condition: good  Hospital Course:  The patient is a 63 year old Caucasian man who is well-known to me. He has several medical problems as described below. He also has a history of occasional migraine headaches for which he takes imipramine. He was in his usual state of good health until about 1900 hours on April 12 when he had a sharp stabbing occipital pain that last about 30 seconds and resolved with taking Tylenol. He slept well that night and did not have problems throughout the day on Monday. Today at approximately 3 AM he awoke and went to work (his usual time for going to work) and noted a mild headache at work. At about 4:30 AM he had a sudden onset of stabbing occipital pain of severe intensity associated with dizziness, difficulty remembering objects, and mild gait unsteadiness, so he presented to the emergency room for evaluation. He had nausea but no vomiting and had not had recent fever, chills, or falls. He is on Xarelto because of paroxysmal atrial fibrillation and has not had any unusual bleeding. Workup in the emergency room included normal vital signs and a nonfocal neurologic exam, with a head CT scan done without contrast that was unremarkable as well as a lumbar puncture that showed no evidence of a CNS bleed. He was given medication for nausea and has been asleep since that time. Currently, he was able to briefly awaken for exam and indicated that his headache had resolved. While in the ER his case was discussed with a neurologist who felt that admission was warranted for evaluation of atypical transient ischemic attack. Of note, he has obstructive  sleep apnea for which CPAP has been recommended, but not used because of intolerance. He has no history of caffeine abuse.  Overnight, his headache improved a great deal, and by the day of discharge he no longer had any headache.  He also has had no difficulty walking and no nausea or change in vision.  He has been eating and drinking well.  During his hospitalization he had multiple procedures done including a transthoracic echocardiogram that showed left ventricular systolic function of 24-26% with no significant valvular heart disease and normal right ventricular function.  He also had a carotid ultrasound exam that showed less than 40% bilateral internal carotid stenosis, and a CT scan of the head that showed no acute abnormalities as well as a lumbar puncture that showed no signs of CNS bleed or infection.  On the day of discharge he had a brain MRI scan with MRA that were read out as a normal MRI for his age with normal MRA exam and trace mastoid effusions of doubtful clinical significance . On telemetry he remained in a sinus rhythm.  Consults: neurology  Significant Diagnostic Studies:  Ct Head Wo Contrast  07/23/2013   CLINICAL DATA:  Headache.  EXAM: CT HEAD WITHOUT CONTRAST  TECHNIQUE: Contiguous axial images were obtained from the base of the skull through the vertex without intravenous contrast.  COMPARISON:  CT HEAD W/O CM dated 01/09/2011  FINDINGS: Moderately motion degraded examination.  The ventricles and sulci are normal for age. No intraparenchymal hemorrhage, mass effect nor midline shift. Patchy supratentorial white matter  hypodensities are within normal range for patient's age and though non-specific suggest sequelae of chronic small vessel ischemic disease. No acute large vascular territory infarcts.  No abnormal extra-axial fluid collections. Basal cisterns are patent.  No skull fracture. The included ocular globes and orbital contents are non-suspicious. The mastoid aircells and  included paranasal sinuses are well-aerated.  IMPRESSION: Moderately motion degraded examination without acute intracranial process.   Electronically Signed   By: Elon Alas   On: 07/23/2013 05:55    Labs: Lab Results  Component Value Date   WBC 10.5 07/24/2013   HGB 13.8 07/24/2013   HCT 41.1 07/24/2013   MCV 95.4 07/24/2013   PLT 203 07/24/2013     Recent Labs Lab 07/24/13 0530  NA 143  K 4.4  CL 107  CO2 21  BUN 20  CREATININE 1.19  CALCIUM 9.1  PROT 6.2  BILITOT 0.4  ALKPHOS 93  ALT 21  AST 17  GLUCOSE 153*       Lab Results  Component Value Date   INR 1.90* 12/06/2011   INR 0.95 01/08/2011   INR 1.14 02/16/2009     Recent Results (from the past 240 hour(s))  CSF CULTURE     Status: None   Collection Time    07/23/13  7:34 AM      Result Value Ref Range Status   Specimen Description CSF   Final   Special Requests CSF FLUID TUBE 2 1.75CC   Final   Gram Stain     Final   Value: NO WBC SEEN     NO ORGANISMS SEEN     Performed at [facility]   Culture     Final   Value: NO GROWTH 1 DAY     Performed at [facility]   Report Status PENDING   Incomplete  GRAM STAIN     Status: None   Collection Time    07/23/13  7:34 AM      Result Value Ref Range Status   Specimen Description CSF NO 2 1.75CC   Final   Special Requests CSF FLUID NO 2 1.75CC   Final   Gram Stain     Final   Value: CSF  CYTOSPIN     NO WBC SEEN     NO ORGANISMS SEEN   Report Status 07/23/2013 FINAL   Final      Discharge Exam: Blood pressure 122/62, pulse 68, temperature 98.4 F (36.9 C), temperature source Oral, resp. rate 18, SpO2 97.00%.  Physical Exam: In general, the patient is a mildly overweight white man who was in no apparent distress sitting upright at the side of his bed.  HEENT exam was within normal limits, neck was supple without jugular venous distention or carotid bruit, chest was clear to auscultation, heart had a regular rate and rhythm, abdomen had  normal bowel sounds and no tenderness, extremities were without cyanosis, clubbing, or edema.  He was alert and well oriented with a normal affect, cranial nerves II through XII are normal, sensory exam was normal, motor strength was 5 out of 5 in all extremities.  He was able to walk without difficulty.  Disposition: he will be discharged to home today and will follow up with [place] within one week.  Discharge Orders   Future Appointments Provider Department Dept Phone   08/08/2013 3:30 PM Thompson Grayer, MD [place] [facility] [phone]   08/27/2013 3:00 PM Jettie Booze, MD [address] [phone]   Future Orders Complete By Expires   Call MD for:  As directed    Diet - low sodium heart healthy  As directed    Discharge instructions  As directed    Increase activity slowly  As directed        Medication List         amiodarone 200 MG tablet  Commonly known as:  PACERONE  Take 1 tablet (200 mg total) by mouth daily.     carvedilol 25 MG tablet  Commonly known as:  COREG  Take 25 mg by mouth 2 (two) times daily with a meal.     ferrous sulfate 325 (65 FE) MG tablet  Take 1 tablet (325 mg total) by mouth 3 (three) times daily with meals.     Fish Oil 1000 MG Caps  Take 1,000 capsules by mouth daily.     furosemide 40 MG tablet  Commonly known as:  LASIX  Take 0.5 tablets (20 mg total) by mouth daily.     GLUCOSAMINE PO  Take 1,000 mg by mouth daily.     imipramine 25 MG tablet  Commonly known as:  TOFRANIL  Take 50 mg by mouth at bedtime.     INVOKANA 300 MG Tabs  Generic drug:  Canagliflozin  Take 1 tablet by mouth daily.     levothyroxine 200 MCG tablet  Commonly known as:  SYNTHROID, LEVOTHROID  Take 200 mcg by mouth daily.     MENS 50+ MULTI VITAMIN/MIN PO  Take 1 tablet by mouth daily.     pantoprazole 40 MG tablet  Commonly known as:  PROTONIX  Take 40 mg by mouth daily.     polycarbophil 625 MG tablet  Commonly known  as:  FIBERCON  Take 1,250 mg by mouth daily.     potassium chloride 20 MEQ packet  Commonly known as:  KLOR-CON  Take 40 mEq by mouth 2 (two) times daily.     pravastatin 20 MG tablet  Commonly known as:  PRAVACHOL  Take 10 mg by mouth at bedtime.     Rivaroxaban 15 MG Tabs tablet  Commonly known as:  XARELTO  Take 15 mg by mouth daily.     Vitamin D 2000 UNITS Caps  Take 2,000 Units by mouth daily.     zolpidem 10 MG tablet  Commonly known as:  AMBIEN  Take 1 tablet by mouth daily as needed for sleep.           Follow-up Information   Follow up with Donnajean Lopes, MD. Schedule an appointment as soon as possible for a visit in 1 week.   Specialty:  Internal Medicine   Contact information:   [place] [address] [phone]       Signed: Leanna Battles 07/24/2013, 8:57 AM

## 2013-07-26 LAB — CSF CULTURE W GRAM STAIN

## 2013-07-26 LAB — CSF CULTURE
CULTURE: NO GROWTH
GRAM STAIN: NONE SEEN

## 2013-07-28 ENCOUNTER — Other Ambulatory Visit: Payer: Self-pay | Admitting: Interventional Cardiology

## 2013-08-08 ENCOUNTER — Ambulatory Visit (INDEPENDENT_AMBULATORY_CARE_PROVIDER_SITE_OTHER): Payer: Managed Care, Other (non HMO) | Admitting: Internal Medicine

## 2013-08-08 ENCOUNTER — Encounter: Payer: Self-pay | Admitting: Internal Medicine

## 2013-08-08 VITALS — BP 134/85 | HR 56 | Ht 68.0 in | Wt 241.0 lb

## 2013-08-08 DIAGNOSIS — I4891 Unspecified atrial fibrillation: Secondary | ICD-10-CM

## 2013-08-08 DIAGNOSIS — I509 Heart failure, unspecified: Secondary | ICD-10-CM

## 2013-08-08 DIAGNOSIS — I5032 Chronic diastolic (congestive) heart failure: Secondary | ICD-10-CM

## 2013-08-08 DIAGNOSIS — J209 Acute bronchitis, unspecified: Secondary | ICD-10-CM

## 2013-08-08 DIAGNOSIS — I5043 Acute on chronic combined systolic (congestive) and diastolic (congestive) heart failure: Secondary | ICD-10-CM

## 2013-08-08 MED ORDER — AZITHROMYCIN 250 MG PO TABS: ORAL_TABLET | ORAL | Status: DC

## 2013-08-08 MED ORDER — AMIODARONE HCL 200 MG PO TABS: 100.0000 mg | ORAL_TABLET | Freq: Every day | ORAL | Status: DC

## 2013-08-08 NOTE — Progress Notes (Signed)
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 male who presents today for routine electrophysiology followup.  Since his atrial fibrillation ablation, the patient reports doing very well.  He has had no further afib.  Today, he denies symptoms of palpitations, chest pain, shortness of breath,  lower extremity edema, dizziness, presyncope, or syncope.  His concern today is with URI symptoms.  He reports 3 days of cough (nonproductice) with SOB and sore throat.  He reports low grade fevers at home.  The patient is otherwise without complaint today.   Past Medical History  Diagnosis Date  . Hypertension   . PAF (paroxysmal atrial fibrillation)     a. failed DCCV and multiple anti-arrhythmic drugs (multaq/flecainide);   b. s/p  PVI isolation with ablation of AFib 8/13; c. repeat PVI 01/2013;  d. 02/2013 repeat DCCV->Amio load/xarelto.  . Renal calculi   . Chronic combined systolic and diastolic CHF (congestive heart failure)     a. LVEF previously 35% felt to be due tachycardia;  b. 02/2013 Echo: EF 50-55%, no rwma, mildly dil LA/RA, mild to mod MR.  . Diabetes mellitus   . Obesities, morbid   . Hypothyroid   . Gastric ulcer     s/p prior surgery  . Diverticulosis   . Erectile dysfunction   . Obstructive sleep apnea     noncompliant with CPAP  . Atrial flutter     a. s/p RFCA 8/13  . CKD (chronic kidney disease), stage III     hx of a/c renal failure during episode of diverticulitis 9/13 in [place], [place]  . Anemia    Past Surgical History  Procedure Laterality Date  . Wrist sx  1980    right  . Knee sx  1985    both  . Benign stomach tumor removal  2000  . Thyroidectomy  2009    cysts removal   . Laparoscopic retropubic prostatectomy  02/2007    hx prostate cancer  . Kidney stone removal      multiple  . Tee without cardioversion  08/25/2011    Procedure: TRANSESOPHAGEAL ECHOCARDIOGRAM (TEE);  Surgeon: Jettie Booze, MD;   Location: [place];  Service: Cardiovascular;  Laterality: N/A;  . Cardioversion  08/25/2011    Procedure: CARDIOVERSION;  Surgeon: Jettie Booze, MD;  Location: [place];  Service: Cardiovascular;  Laterality: N/A;  . Tee without cardioversion  11/09/2011    Procedure: TRANSESOPHAGEAL ECHOCARDIOGRAM (TEE);  Surgeon: Jettie Booze, MD;  Location: [facility] ENDOSCOPY;  Service: Cardiovascular;  Laterality: N/A;  h/p in file drawer  . Atrial fibrillation ablation  12/06/11; 02/05/2013    Afib and atrial flutter ablation by Dr Rayann Heman; repeat PVI by Dr Rayann Heman 02/05/2013  . Tee without cardioversion N/A 02/05/2013    Procedure: TRANSESOPHAGEAL ECHOCARDIOGRAM (TEE);  Surgeon: Candee Furbish, MD;  Location: [facility] ENDOSCOPY;  Service: Cardiovascular;  Laterality: N/A;  . Cardioversion N/A 03/05/2013    Procedure: CARDIOVERSION;  Surgeon: Sinclair Grooms, MD;  Location: [place];  Service: Cardiovascular;  Laterality: N/A;  BEDSIDE   . Esophagogastroduodenoscopy N/A 04/09/2013    Procedure: ESOPHAGOGASTRODUODENOSCOPY (EGD) with possible Balloon Dilation;  Surgeon: Garlan Fair, MD;  Location: [facility] ENDOSCOPY;  Service: Endoscopy;  Laterality: N/A;    Current Outpatient Prescriptions  Medication Sig Dispense Refill  . albuterol (PROVENTIL) (2.5 MG/3ML) 0.083% nebulizer solution Take 2.5 mg by nebulization as needed.       Marland Kitchen amiodarone (PACERONE) 200  MG tablet Take 0.5 tablets (100 mg total) by mouth daily.  90 tablet  3  . Canagliflozin (INVOKANA) 300 MG TABS Take 1 tablet by mouth daily.      . carvedilol (COREG) 25 MG tablet Take 25 mg by mouth 2 (two) times daily with a meal.      . Cholecalciferol (VITAMIN D) 2000 UNITS CAPS Take 2,000 Units by mouth daily.      . ferrous sulfate 325 (65 FE) MG tablet Take 1 tablet (325 mg total) by mouth 3 (three) times daily with meals.  90 tablet  1  . furosemide (LASIX) 40 MG tablet Take 0.5 tablets (20 mg total) by mouth daily.  30 tablet  6  .  GLUCOSAMINE PO Take 1,000 mg by mouth daily.       Marland Kitchen imipramine (TOFRANIL) 25 MG tablet Take 50 mg by mouth at bedtime.       Marland Kitchen levothyroxine (SYNTHROID, LEVOTHROID) 200 MCG tablet Take 200 mcg by mouth daily.      Marland Kitchen losartan (COZAAR) 100 MG tablet Take 1 tablet by mouth daily.      . Multiple Vitamins-Minerals (MENS 50+ MULTI VITAMIN/MIN PO) Take 1 tablet by mouth daily.      . Omega-3 Fatty Acids (FISH OIL) 1000 MG CAPS Take 1 capsule by mouth daily.       . pantoprazole (PROTONIX) 40 MG tablet Take 40 mg by mouth daily.       . polycarbophil (FIBERCON) 625 MG tablet Take 1,250 mg by mouth daily.      . potassium chloride (KLOR-CON) 20 MEQ packet Take 40 mEq by mouth 2 (two) times daily.      . pravastatin (PRAVACHOL) 20 MG tablet Take 10 mg by mouth at bedtime.       Alveda Reasons 15 MG TABS tablet TAKE 1 TABLET BY MOUTH ONE TIME DAILY  90 tablet  2  . zolpidem (AMBIEN) 10 MG tablet Take 1 tablet by mouth daily as needed for sleep.       Marland Kitchen azithromycin (ZITHROMAX Z-PAK) 250 MG tablet Take as directed  Take 2 the first day then one daily until gone  6 each  0   No current facility-administered medications for this visit.    Physical Exam: Filed Vitals:   08/08/13 1542  BP: 134/85  Pulse: 56  Height: 5\' 8"  (1.727 m)  Weight: 241 lb (109.317 kg)    GEN- The patient is well appearing, alert and oriented x 3 today. Frequently coughing today  Head- normocephalic, atraumatic Eyes-  Sclera clear, conjunctiva pink Ears- hearing intact Oropharynx- with erythematous oropharynx + shoddy anterior cervical LAD Lungs- diffuse wheezing with tachypnea and frequent cough Heart- Regular rate and rhythm, no murmurs, rubs or gallops, PMI not laterally displaced GI- soft, NT, ND, + BS Extremities- no clubbing, cyanosis, or edema  ekg today reveals sinus rhythm 56 bpm,nonspecific ST/T changes  Assessment and Plan:  1. Atrial fibrillation/ atrial flutter Doing well s/p ablation Decrease amiodarone  to 100mg  daily Will stop amiodarone upon return Continue anticoagulation  2. Diastolic dysfunction Decrease lasix bmet today  3. Acute bronchitis/ URI Will treat w azithromycin today He will followup with PCP if not improved or if symptoms worsen  Return in 3 months  He should continue long term anticoagulation

## 2013-08-08 NOTE — Patient Instructions (Addendum)
Your physician recommends that you schedule a follow-up appointment in: 3 months with Dr Allred  Your physician has recommended you make the following change in your medication:  1) Decrease Amiodarone to 100mg daily   

## 2013-08-12 ENCOUNTER — Telehealth: Payer: Self-pay | Admitting: Internal Medicine

## 2013-08-12 NOTE — Telephone Encounter (Signed)
To [name] [name], pt recently seen Dr. Rayann Heman and amio was decreased.

## 2013-08-12 NOTE — Telephone Encounter (Signed)
4 days after decreasing his Amiodarone he went back into afib  She increased his dose of Amiodarone back 200mg  daily

## 2013-08-12 NOTE — Telephone Encounter (Signed)
New problem    Pt's medication was decreased and his having afib. Pt's wife switched his medication and need to speak to nurse concerning this matter.

## 2013-08-14 NOTE — Telephone Encounter (Signed)
Discussed with Dr Rayann Heman.  Okay to stay on Amiodarone to 200mg  daily. Will decrease in 2 weeks to 200mg  daily except on Sat And Sun 100mg .  She will call if he goes back out of rhythm

## 2013-08-27 ENCOUNTER — Ambulatory Visit: Payer: Managed Care, Other (non HMO) | Admitting: Interventional Cardiology

## 2013-09-25 ENCOUNTER — Other Ambulatory Visit: Payer: Self-pay | Admitting: Interventional Cardiology

## 2013-10-01 ENCOUNTER — Other Ambulatory Visit: Payer: Self-pay | Admitting: Cardiology

## 2013-10-01 MED ORDER — CARVEDILOL 25 MG PO TABS: ORAL_TABLET | ORAL | Status: DC

## 2013-11-07 ENCOUNTER — Other Ambulatory Visit: Payer: Self-pay | Admitting: *Deleted

## 2013-11-07 MED ORDER — AMIODARONE HCL 200 MG PO TABS: ORAL_TABLET | ORAL | Status: DC

## 2013-12-02 ENCOUNTER — Ambulatory Visit (INDEPENDENT_AMBULATORY_CARE_PROVIDER_SITE_OTHER): Payer: Managed Care, Other (non HMO) | Admitting: Physician Assistant

## 2013-12-02 ENCOUNTER — Encounter: Payer: Self-pay | Admitting: Physician Assistant

## 2013-12-02 VITALS — BP 125/81 | HR 53 | Ht 68.0 in | Wt 235.4 lb

## 2013-12-02 DIAGNOSIS — I480 Paroxysmal atrial fibrillation: Secondary | ICD-10-CM

## 2013-12-02 DIAGNOSIS — I4891 Unspecified atrial fibrillation: Secondary | ICD-10-CM

## 2013-12-02 DIAGNOSIS — I10 Essential (primary) hypertension: Secondary | ICD-10-CM

## 2013-12-02 DIAGNOSIS — I5042 Chronic combined systolic (congestive) and diastolic (congestive) heart failure: Secondary | ICD-10-CM

## 2013-12-02 MED ORDER — AMIODARONE HCL 200 MG PO TABS: ORAL_TABLET | ORAL | Status: DC

## 2013-12-02 NOTE — Assessment & Plan Note (Signed)
Patient would like to eventually come off the amiodarone. We'll change his amiodarone to 200 mg daily except for 100 mg on Tuesdays and Thursdays. This was actually recommended by Dr. Rayann Heman back in May 2015. Followup with Dr. Rayann Heman.

## 2013-12-02 NOTE — Assessment & Plan Note (Signed)
No evidence of heart failure on exam 

## 2013-12-02 NOTE — Progress Notes (Signed)
HPI: This is a 63 year old male patient Dr. Rayann Heman and Dr.varanasi who has history of atrial fibrillation ablation in 11/2011. Had recurrent atrial fibrillation treated with amiodarone. Last office visit in April 2015 Dr. Rayann Heman tried to decrease his amiodarone to 100 mg once daily and the patient went back into atrial fibrillation. He went back up to 200 mg daily and is now wondering if he could decrease it to 200 mg daily except on Tuesdays and Thursdays 100 mg. This was actually recommended by Dr. Rayann Heman but was never done. Patient denies any further symptoms of atrial fibrillation. Denies chest pain, palpitations, dyspnea, dyspnea on exertion, dizziness, or presyncope.  Patient also has a history of chronic systolic and diastolic heart failure previous EF was 35% felt secondary to tachycardia. Last echo in 02/2013 EF was 50-55%  Allergies  Allergen Reactions  . Phenergan [Promethazine Hcl] Itching    Hallucination   . Aspirin     Hx of ulcer   . Bystolic [Nebivolol Hcl]     Bradycardia   . Calcium Channel Blockers     LE edema  . Prednisone     Hx of ulcer  . Nsaids     Hx ulcer     Current Outpatient Prescriptions  Medication Sig Dispense Refill  . albuterol (PROVENTIL) (2.5 MG/3ML) 0.083% nebulizer solution Take 2.5 mg by nebulization as needed.       Marland Kitchen amiodarone (PACERONE) 200 MG tablet Take 200mg  daily except on Saturday and Sunday take 100mg   30 tablet  1  . Canagliflozin (INVOKANA) 300 MG TABS Take 1 tablet by mouth daily.      . carvedilol (COREG) 25 MG tablet TAKE 1 TABLET BY MOUTH TWICE DAILY  180 tablet  0  . Cholecalciferol (VITAMIN D) 2000 UNITS CAPS Take 2,000 Units by mouth daily.      . ferrous sulfate 325 (65 FE) MG tablet Take 1 tablet (325 mg total) by mouth 3 (three) times daily with meals.  90 tablet  1  . furosemide (LASIX) 40 MG tablet Take 0.5 tablets (20 mg total) by mouth daily.  30 tablet  6  . GLUCOSAMINE PO Take 1,000 mg by mouth daily.        Marland Kitchen imipramine (TOFRANIL) 25 MG tablet Take 50 mg by mouth at bedtime.       Marland Kitchen levothyroxine (SYNTHROID, LEVOTHROID) 200 MCG tablet Take 200 mcg by mouth daily.      Marland Kitchen losartan (COZAAR) 100 MG tablet Take 1 tablet by mouth daily.      . Multiple Vitamins-Minerals (MENS 50+ MULTI VITAMIN/MIN PO) Take 1 tablet by mouth daily.      . Omega-3 Fatty Acids (FISH OIL) 1000 MG CAPS Take 1 capsule by mouth daily.       . pantoprazole (PROTONIX) 40 MG tablet Take 40 mg by mouth daily.       . polycarbophil (FIBERCON) 625 MG tablet Take 1,250 mg by mouth daily.      . potassium chloride (KLOR-CON) 20 MEQ packet Take 40 mEq by mouth 2 (two) times daily.      . pravastatin (PRAVACHOL) 20 MG tablet Take 10 mg by mouth at bedtime.       Alveda Reasons 15 MG TABS tablet TAKE 1 TABLET BY MOUTH ONE TIME DAILY  90 tablet  2  . zolpidem (AMBIEN) 10 MG tablet Take 1 tablet by mouth daily as needed for sleep.        No current  facility-administered medications for this visit.    Past Medical History  Diagnosis Date  . Hypertension   . PAF (paroxysmal atrial fibrillation)     a. failed DCCV and multiple anti-arrhythmic drugs (multaq/flecainide);   b. s/p  PVI isolation with ablation of AFib 8/13; c. repeat PVI 01/2013;  d. 02/2013 repeat DCCV->Amio load/xarelto.  . Renal calculi   . Chronic combined systolic and diastolic CHF (congestive heart failure)     a. LVEF previously 35% felt to be due tachycardia;  b. 02/2013 Echo: EF 50-55%, no rwma, mildly dil LA/RA, mild to mod MR.  . Diabetes mellitus   . Obesities, morbid   . Hypothyroid   . Gastric ulcer     s/p prior surgery  . Diverticulosis   . Erectile dysfunction   . Obstructive sleep apnea     noncompliant with CPAP  . Atrial flutter     a. s/p RFCA 8/13  . CKD (chronic kidney disease), stage III     hx of a/c renal failure during episode of diverticulitis 9/13 in [place], [place]  . Anemia     Past Surgical History  Procedure Laterality Date  .  Wrist sx  1980    right  . Knee sx  1985    both  . Benign stomach tumor removal  2000  . Thyroidectomy  2009    cysts removal   . Laparoscopic retropubic prostatectomy  02/2007    hx prostate cancer  . Kidney stone removal      multiple  . Tee without cardioversion  08/25/2011    Procedure: TRANSESOPHAGEAL ECHOCARDIOGRAM (TEE);  Surgeon: Jettie Booze, MD;  Location: [place];  Service: Cardiovascular;  Laterality: N/A;  . Cardioversion  08/25/2011    Procedure: CARDIOVERSION;  Surgeon: Jettie Booze, MD;  Location: [place];  Service: Cardiovascular;  Laterality: N/A;  . Tee without cardioversion  11/09/2011    Procedure: TRANSESOPHAGEAL ECHOCARDIOGRAM (TEE);  Surgeon: Jettie Booze, MD;  Location: [facility] ENDOSCOPY;  Service: Cardiovascular;  Laterality: N/A;  h/p in file drawer  . Atrial fibrillation ablation  12/06/11; 02/05/2013    Afib and atrial flutter ablation by Dr Rayann Heman; repeat PVI by Dr Rayann Heman 02/05/2013  . Tee without cardioversion N/A 02/05/2013    Procedure: TRANSESOPHAGEAL ECHOCARDIOGRAM (TEE);  Surgeon: Candee Furbish, MD;  Location[facility] ENDOSCOPY;  Service: Cardiovascular;  Laterality: N/A;  . Cardioversion N/A 03/05/2013    Procedure: CARDIOVERSION;  Surgeon: Sinclair Grooms, MD;  Location: [place];  Service: Cardiovascular;  Laterality: N/A;  BEDSIDE   . Esophagogastroduodenoscopy N/A 04/09/2013    Procedure: ESOPHAGOGASTRODUODENOSCOPY (EGD) with possible Balloon Dilation;  Surgeon: Garlan Fair, MD;  Location: [facility] ENDOSCOPY;  Service: Endoscopy;  Laterality: N/A;    Family History  Problem Relation Age of Onset  . Emphysema Mother   . Lung cancer Father   . Diabetes Brother   . Colon cancer Neg Hx     History   Social History  . Marital Status: Married    Spouse Name: N/A    Number of Children: N/A  . Years of Education: N/A   Occupational History  . Not on file.   Social History Main Topics  . Smoking status: Never Smoker   .  Smokeless tobacco: Not on file  . Alcohol Use: No  . Drug Use: No  . Sexual Activity: Yes   Other Topics Concern  . Not on file   Social History Narrative   Lives in [place].  Works as a Librarian, academic at [employer]: See history of present illness otherwise negative  BP 125/81  Pulse 53  Ht 5\' 8"  (1.727 m)  Wt 235 lb 6.4 oz (106.777 kg)  BMI 35.80 kg/m2  PHYSICAL EXAM: Obese, in no acute distress. Neck: No JVD, HJR, Bruit, or thyroid enlargement  Lungs: No tachypnea, clear without wheezing, rales, or rhonchi  Cardiovascular: RRR, PMI not displaced, heart sounds normal, no murmurs, gallops, bruit, thrill, or heave.  Abdomen: BS normal. Soft without organomegaly, masses, lesions or tenderness.  Extremities: without cyanosis, clubbing or edema. Good distal pulses bilateral  SKin: Warm, no lesions or rashes   Musculoskeletal: No deformities  Neuro: no focal signs   Wt Readings from Last 3 Encounters:  12/02/13 235 lb 6.4 oz (106.777 kg)  08/08/13 241 lb (109.317 kg)  05/06/13 242 lb 12.8 oz (110.133 kg)     EKG: Sinus bradycardia at 50 beats per minute with nonspecific ST-T wave changes

## 2013-12-02 NOTE — Patient Instructions (Addendum)
Your physician has recommended you make the following change in your medication:  1. Decrease AMIODARONE 200 Mg DAILY EXCEPT ON TUESDAYS AND THURSDAYS TAKE 100 MG DAILY   Your physician recommends that you schedule a follow-up appointment in: 3 TO 4 MONTHS WITH DR.ALLRED

## 2013-12-02 NOTE — Assessment & Plan Note (Signed)
Blood pressure stable ? ?

## 2013-12-04 ENCOUNTER — Encounter: Payer: Self-pay | Admitting: Physician Assistant

## 2013-12-22 ENCOUNTER — Other Ambulatory Visit: Payer: Self-pay | Admitting: Interventional Cardiology

## 2013-12-23 NOTE — Telephone Encounter (Signed)
Refill until appt with Dr. Rayann Heman.

## 2013-12-23 NOTE — Telephone Encounter (Signed)
Patient cancelled his recent appointment with Dr Irish Lack, but has been seeing Dr Rayann Heman. How many refills can be authorized? Please advise. Thanks, MI

## 2014-02-26 ENCOUNTER — Other Ambulatory Visit: Payer: Self-pay

## 2014-02-26 MED ORDER — LOSARTAN POTASSIUM 100 MG PO TABS: 100.0000 mg | ORAL_TABLET | Freq: Every day | ORAL | Status: DC

## 2014-03-14 ENCOUNTER — Ambulatory Visit (INDEPENDENT_AMBULATORY_CARE_PROVIDER_SITE_OTHER): Payer: Managed Care, Other (non HMO) | Admitting: Internal Medicine

## 2014-03-14 ENCOUNTER — Encounter: Payer: Self-pay | Admitting: Internal Medicine

## 2014-03-14 VITALS — BP 118/78 | HR 51 | Ht 68.0 in | Wt 230.0 lb

## 2014-03-14 DIAGNOSIS — I480 Paroxysmal atrial fibrillation: Secondary | ICD-10-CM

## 2014-03-14 NOTE — Patient Instructions (Signed)
Your physician wants you to follow-up in: 6 months with Dr. Allred. You will receive a reminder letter in the mail two months in advance. If you don't receive a letter, please call our office to schedule the follow-up appointment.  

## 2014-03-14 NOTE — Progress Notes (Signed)
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 male who presents today for routine electrophysiology followup.  Since his last visit, the patient reports doing very well.  He has had rare palpitations but no prolonged AF.  Today, he denies symptoms of palpitations, chest pain, shortness of breath,  lower extremity edema, dizziness, presyncope, or syncope.  His concern today is with URI symptoms.  He reports 3 days of cough (nonproductice) with SOB and sore throat.  He reports low grade fevers at home.  The patient is otherwise without complaint today.   Past Medical History  Diagnosis Date  . Hypertension   . PAF (paroxysmal atrial fibrillation)     a. failed DCCV and multiple anti-arrhythmic drugs (multaq/flecainide);   b. s/p  PVI isolation with ablation of AFib 8/13; c. repeat PVI 01/2013;  d. 02/2013 repeat DCCV->Amio load/xarelto.  . Renal calculi   . Chronic combined systolic and diastolic CHF (congestive heart failure)     a. LVEF previously 35% felt to be due tachycardia;  b. 02/2013 Echo: EF 50-55%, no rwma, mildly dil LA/RA, mild to mod MR.  . Diabetes mellitus   . Obesities, morbid   . Hypothyroid   . Gastric ulcer     s/p prior surgery  . Diverticulosis   . Erectile dysfunction   . Obstructive sleep apnea     noncompliant with CPAP  . Atrial flutter     a. s/p RFCA 8/13  . CKD (chronic kidney disease), stage III     hx of a/c renal failure during episode of diverticulitis 9/13 in [place], [place]  . Anemia    Past Surgical History  Procedure Laterality Date  . Wrist sx  1980    right  . Knee sx  1985    both  . Benign stomach tumor removal  2000  . Thyroidectomy  2009    cysts removal   . Laparoscopic retropubic prostatectomy  02/2007    hx prostate cancer  . Kidney stone removal      multiple  . Tee without cardioversion  08/25/2011    Procedure: TRANSESOPHAGEAL ECHOCARDIOGRAM (TEE);  Surgeon: Jettie Booze, MD;   Location: [facility];  Service: Cardiovascular;  Laterality: N/A;  . Cardioversion  08/25/2011    Procedure: CARDIOVERSION;  Surgeon: Jettie Booze, MD;  Location: [facility] [place];  Service: Cardiovascular;  Laterality: N/A;  . Tee without cardioversion  11/09/2011    Procedure: TRANSESOPHAGEAL ECHOCARDIOGRAM (TEE);  Surgeon: Jettie Booze, MD;  Location: [facility] ENDOSCOPY;  Service: Cardiovascular;  Laterality: N/A;  h/p in file drawer  . Atrial fibrillation ablation  12/06/11; 02/05/2013    Afib and atrial flutter ablation by Dr Rayann Heman; repeat PVI by Dr Rayann Heman 02/05/2013  . Tee without cardioversion N/A 02/05/2013    Procedure: TRANSESOPHAGEAL ECHOCARDIOGRAM (TEE);  Surgeon: Candee Furbish, MD;  Location: [facility] ENDOSCOPY;  Service: Cardiovascular;  Laterality: N/A;  . Cardioversion N/A 03/05/2013    Procedure: CARDIOVERSION;  Surgeon: Sinclair Grooms, MD;  Location: [place];  Service: Cardiovascular;  Laterality: N/A;  BEDSIDE   . Esophagogastroduodenoscopy N/A 04/09/2013    Procedure: ESOPHAGOGASTRODUODENOSCOPY (EGD) with possible Balloon Dilation;  Surgeon: Garlan Fair, MD;  Location: [facility] ENDOSCOPY;  Service: Endoscopy;  Laterality: N/A;    Current Outpatient Prescriptions  Medication Sig Dispense Refill  . albuterol (PROVENTIL) (2.5 MG/3ML) 0.083% nebulizer solution Take 2.5 mg by nebulization every 4 (four) hours as needed for wheezing or shortness  of breath.     Marland Kitchen amiodarone (PACERONE) 200 MG tablet 200 mg daily except on tuesday and thursdays take 100 mg daily (Patient taking differently: Take 200 mg by mouth daily except on tuesday and thursdays take 100 mg by mouth daily) 90 tablet 3  . Canagliflozin (INVOKANA) 300 MG TABS Take 1 tablet by mouth daily.    . carvedilol (COREG) 25 MG tablet TAKE 1 TABLET BY MOUTH TWICE DAILY 180 tablet 0  . Cholecalciferol (VITAMIN D) 2000 UNITS CAPS Take 2,000 Units by mouth daily.    . ferrous sulfate 325 (65 FE) MG tablet Take 1 tablet (325 mg  total) by mouth 3 (three) times daily with meals. 90 tablet 1  . fluticasone (FLONASE) 50 MCG/ACT nasal spray Place 2 sprays into both nostrils daily as needed. allergies    . furosemide (LASIX) 40 MG tablet Take 0.5 tablets (20 mg total) by mouth daily. 30 tablet 6  . GLUCOSAMINE PO Take 1,000 mg by mouth daily.     Marland Kitchen imipramine (TOFRANIL) 25 MG tablet Take 50 mg by mouth at bedtime.     Marland Kitchen levothyroxine (SYNTHROID, LEVOTHROID) 200 MCG tablet Take 200 mcg by mouth daily.    Marland Kitchen losartan (COZAAR) 100 MG tablet Take 1 tablet (100 mg total) by mouth daily. 10 tablet 0  . Multiple Vitamins-Minerals (MENS 50+ MULTI VITAMIN/MIN PO) Take 1 tablet by mouth daily.    . Omega-3 Fatty Acids (FISH OIL) 1000 MG CAPS Take 1 capsule by mouth daily.     . pantoprazole (PROTONIX) 40 MG tablet Take 40 mg by mouth daily.     . polycarbophil (FIBERCON) 625 MG tablet Take 1,250 mg by mouth daily.    . potassium chloride SA (K-DUR,KLOR-CON) 20 MEQ tablet Take 2 tablets by mouth daily.    . pravastatin (PRAVACHOL) 20 MG tablet Take 10 mg by mouth at bedtime.     Alveda Reasons 15 MG TABS tablet TAKE 1 TABLET BY MOUTH ONE TIME DAILY 90 tablet 2  . zolpidem (AMBIEN) 10 MG tablet Take 1 tablet by mouth daily as needed for sleep.      No current facility-administered medications for this visit.    Physical Exam: Filed Vitals:   03/14/14 1456  BP: 118/78  Pulse: 51  Height: 5\' 8"  (1.727 m)  Weight: 230 lb (104.327 kg)    GEN- The patient is well appearing, alert and oriented x 3 today. Frequently coughing today  Head- normocephalic, atraumatic Eyes-  Sclera clear, conjunctiva pink Ears- hearing intact Oropharynx- OP clear Lungs clear Heart- Regular rate and rhythm, no murmurs, rubs or gallops, PMI not laterally displaced GI- soft, NT, ND, + BS Extremities- no clubbing, cyanosis, or edema  ekg today reveals sinus rhythm 51 bpm,nonspecific ST/T changes  Assessment and Plan:  1. Atrial fibrillation/ atrial  flutter Doing well s/p ablation He remains reluctant to decrease amiodarone to100mg  daily as I previously advised.  I would like to get him off of this medicine.  He is going to [place] in March and says that he may be more willing to consider this when he returns LFTs/TFTs are followed by PCP and normal per patient Continue xarelto  2. Diastolic dysfunction Stable No change required today   Return in 6 months  He should continue long term anticoagulation

## 2014-03-20 ENCOUNTER — Encounter (HOSPITAL_COMMUNITY): Payer: Self-pay | Admitting: Internal Medicine

## 2014-03-28 ENCOUNTER — Other Ambulatory Visit: Payer: Self-pay | Admitting: *Deleted

## 2014-03-28 MED ORDER — FUROSEMIDE 40 MG PO TABS: 20.0000 mg | ORAL_TABLET | Freq: Every day | ORAL | Status: DC

## 2014-04-14 ENCOUNTER — Other Ambulatory Visit: Payer: Self-pay | Admitting: Interventional Cardiology

## 2014-05-02 ENCOUNTER — Other Ambulatory Visit: Payer: Self-pay | Admitting: Interventional Cardiology

## 2014-05-07 ENCOUNTER — Other Ambulatory Visit: Payer: Self-pay | Admitting: *Deleted

## 2014-05-07 MED ORDER — LOSARTAN POTASSIUM 100 MG PO TABS: 100.0000 mg | ORAL_TABLET | Freq: Every day | ORAL | Status: DC

## 2014-05-29 ENCOUNTER — Encounter: Payer: Self-pay | Admitting: Interventional Cardiology

## 2014-05-29 ENCOUNTER — Ambulatory Visit (INDEPENDENT_AMBULATORY_CARE_PROVIDER_SITE_OTHER): Payer: Managed Care, Other (non HMO) | Admitting: Interventional Cardiology

## 2014-05-29 VITALS — BP 140/90 | HR 52 | Ht 68.0 in | Wt 229.0 lb

## 2014-05-29 DIAGNOSIS — N183 Chronic kidney disease, stage 3 unspecified: Secondary | ICD-10-CM

## 2014-05-29 DIAGNOSIS — I480 Paroxysmal atrial fibrillation: Secondary | ICD-10-CM

## 2014-05-29 DIAGNOSIS — I10 Essential (primary) hypertension: Secondary | ICD-10-CM

## 2014-05-29 NOTE — Progress Notes (Signed)
Patient ID: [name] [name], male   DOB: 1950-05-02, 64 y.o.   MRN: [id number]    [place], [place] [place], [place]  [address] Phone: [phone] Fax:  [phone]  Date:  05/29/2014   ID:  [name] [name], DOB May 28, 1950, MRN [id number]  PCP:  Donnajean Lopes, MD      History of Present Illness: [name] [name] is a 64 y.o. male who has had a history of atrial fibrillation. Other medical problems have included hypokalemia, diverticulitis and renal insufficiency. He has had 2 atrial fibrillation ablations. He continues to have some intermittent symptoms of atrial fibrillation. He is not having any bleeding issues. He is maintaining sinus rhythm for the most part on amiodarone. It has been discussed with Dr. Rayann Heman as to whether he should be on a lower dosage of amiodarone. He was encouraged to try 100 mg daily. He is going away to reduce the dose until he returns from his planned trip to [place].   Wt Readings from Last 3 Encounters:  05/29/14 229 lb (103.874 kg)  03/14/14 230 lb (104.327 kg)  12/02/13 235 lb 6.4 oz (106.777 kg)     Past Medical History  Diagnosis Date  . Hypertension   . PAF (paroxysmal atrial fibrillation)     a. failed DCCV and multiple anti-arrhythmic drugs (multaq/flecainide);   b. s/p  PVI isolation with ablation of AFib 8/13; c. repeat PVI 01/2013;  d. 02/2013 repeat DCCV->Amio load/xarelto.  . Renal calculi   . Chronic combined systolic and diastolic CHF (congestive heart failure)     a. LVEF previously 35% felt to be due tachycardia;  b. 02/2013 Echo: EF 50-55%, no rwma, mildly dil LA/RA, mild to mod MR.  . Diabetes mellitus   . Obesities, morbid   . Hypothyroid   . Gastric ulcer     s/p prior surgery  . Diverticulosis   . Erectile dysfunction   . Obstructive sleep apnea     noncompliant with CPAP  . Atrial flutter     a. s/p RFCA 8/13  . CKD (chronic kidney disease), stage III     hx of a/c renal failure during episode of  diverticulitis 9/13 in [place], [place]  . Anemia     Current Outpatient Prescriptions  Medication Sig Dispense Refill  . albuterol (PROVENTIL) (2.5 MG/3ML) 0.083% nebulizer solution Take 2.5 mg by nebulization every 4 (four) hours as needed for wheezing or shortness of breath.     Marland Kitchen amiodarone (PACERONE) 200 MG tablet 200 mg daily except on tuesday and thursdays take 100 mg daily (Patient taking differently: Take 200 mg by mouth daily except on tuesday and thursdays take 100 mg by mouth daily) 90 tablet 3  . Canagliflozin (INVOKANA) 300 MG TABS Take 1 tablet by mouth daily.    . carvedilol (COREG) 25 MG tablet TAKE 1 TABLET BY MOUTH TWICE DAILY 180 tablet 1  . Cholecalciferol (VITAMIN D) 2000 UNITS CAPS Take 2,000 Units by mouth daily.    . ferrous sulfate 325 (65 FE) MG tablet Take 1 tablet (325 mg total) by mouth 3 (three) times daily with meals. 90 tablet 1  . fluticasone (FLONASE) 50 MCG/ACT nasal spray Place 2 sprays into both nostrils daily as needed. allergies    . furosemide (LASIX) 40 MG tablet Take 0.5 tablets (20 mg total) by mouth daily. 15 tablet 5  . GLUCOSAMINE PO Take 1,000 mg by mouth daily.     Marland Kitchen imipramine (TOFRANIL) 25 MG tablet  Take 50 mg by mouth at bedtime.     Marland Kitchen levothyroxine (SYNTHROID, LEVOTHROID) 200 MCG tablet Take 200 mcg by mouth daily.    Marland Kitchen losartan (COZAAR) 100 MG tablet Take 1 tablet (100 mg total) by mouth daily. 90 tablet 0  . Multiple Vitamins-Minerals (MENS 50+ MULTI VITAMIN/MIN PO) Take 1 tablet by mouth daily.    . Omega-3 Fatty Acids (FISH OIL) 1000 MG CAPS Take 1 capsule by mouth daily.     . pantoprazole (PROTONIX) 40 MG tablet Take 40 mg by mouth daily.     . polycarbophil (FIBERCON) 625 MG tablet Take 1,250 mg by mouth daily.    . potassium chloride SA (K-DUR,KLOR-CON) 20 MEQ tablet Take 2 tablets by mouth daily.    . pravastatin (PRAVACHOL) 20 MG tablet Take 10 mg by mouth at bedtime.     Alveda Reasons 15 MG TABS tablet TAKE 1 TABLET BY MOUTH ONE TIME  DAILY 90 tablet 1  . zolpidem (AMBIEN) 10 MG tablet Take 1 tablet by mouth daily as needed for sleep.      No current facility-administered medications for this visit.    Allergies:    Allergies  Allergen Reactions  . Phenergan [Promethazine Hcl] Itching    Hallucination   . Aspirin     Hx of ulcer   . Bystolic [Nebivolol Hcl]     Bradycardia   . Calcium Channel Blockers     LE edema  . Prednisone     Hx of ulcer  . Nsaids     Hx ulcer    Social History:  The patient  reports that he has never smoked. He does not have any smokeless tobacco history on file. He reports that he does not drink alcohol or use illicit drugs.   Family History:  The patient's family history includes Diabetes in his brother; Emphysema in his mother; Lung cancer in his father. There is no history of Colon cancer.   ROS:  Please see the history of present illness.  No nausea, vomiting.  No fevers, chills.  No focal weakness.  No dysuria.    All other systems reviewed and negative.   PHYSICAL EXAM: VS:  BP 140/90 mmHg  Pulse 52  Ht 5\' 8"  (1.727 m)  Wt 229 lb (103.874 kg)  BMI 34.83 kg/m2 General: Well developed, well nourished, in no acute distress HEENT: normal Neck: no JVD, no carotid bruits Cardiac:  normal S1, S2; bradycardia Lungs:  clear to auscultation bilaterally, no wheezing, rhonchi or rales Abd: soft, nontender, no hepatomegaly Ext: no edema Skin: warm and dry Neuro:   no focal abnormalities noted Psych: normal affect  EKG:  NSR, inferolateral ST-T wave changes.  NO change from prior.   ASSESSMENT AND PLAN:  1. AFib: Maintaining NSR at this time.  Discussed again decreasing dose of amiodarone. He is concerned about long-term side effects. Dr. Rayann Heman gave him permission to decrease to 100 mg a day. He will likely try to reduce the dose for 2 days a week to see how this does at the beginning. 2. Obesity: This is been a chronic issue.  I encouraged exercise and diet control to help  with weight loss. 3. Chronic renal insufficiency: Xarelto dose has been adjusted for this. 4. Since atrial fibrillation is his main issue, he will continue to follow-up with Dr. Rayann Heman as previously scheduled. He has had negative stress tests in the past. If there are any ischemic issues, or non-EP-related issues, I would be happy  to see him. 5. Hypertension: Borderline control: Continue to check at home. Lifestyle modification should help. Continue carvedilol.  Signed, Mina Marble, MD, [facility] 05/29/2014 5:25 PM

## 2014-05-29 NOTE — Patient Instructions (Signed)
Your physician recommends that you continue on your current medications as directed. Please refer to the Current Medication list given to you today.  Your physician recommends that you follow-up with Dr. Rayann Heman.  Call us if you need extra medications before [place] trip.

## 2014-07-09 ENCOUNTER — Other Ambulatory Visit: Payer: Self-pay

## 2014-07-09 MED ORDER — LOSARTAN POTASSIUM 100 MG PO TABS: 100.0000 mg | ORAL_TABLET | Freq: Every day | ORAL | Status: DC

## 2014-07-09 MED ORDER — POTASSIUM CHLORIDE CRYS ER 20 MEQ PO TBCR: 40.0000 meq | EXTENDED_RELEASE_TABLET | Freq: Every day | ORAL | Status: DC

## 2014-11-10 ENCOUNTER — Other Ambulatory Visit: Payer: Self-pay | Admitting: Physician Assistant

## 2014-11-19 ENCOUNTER — Emergency Department (HOSPITAL_COMMUNITY): Payer: Managed Care, Other (non HMO)

## 2014-11-19 ENCOUNTER — Encounter (HOSPITAL_COMMUNITY): Payer: Self-pay | Admitting: *Deleted

## 2014-11-19 ENCOUNTER — Emergency Department (HOSPITAL_COMMUNITY)
Admission: EM | Admit: 2014-11-19 | Discharge: 2014-11-19 | Disposition: A | Discharge status: Home / Self Care | Payer: Managed Care, Other (non HMO) | Attending: Emergency Medicine | Admitting: Emergency Medicine

## 2014-11-19 ENCOUNTER — Other Ambulatory Visit: Payer: Self-pay

## 2014-11-19 DIAGNOSIS — I129 Hypertensive chronic kidney disease with stage 1 through stage 4 chronic kidney disease, or unspecified chronic kidney disease: Secondary | ICD-10-CM | POA: Insufficient documentation

## 2014-11-19 DIAGNOSIS — Z8719 Personal history of other diseases of the digestive system: Secondary | ICD-10-CM | POA: Insufficient documentation

## 2014-11-19 DIAGNOSIS — Z8669 Personal history of other diseases of the nervous system and sense organs: Secondary | ICD-10-CM | POA: Diagnosis not present

## 2014-11-19 DIAGNOSIS — E119 Type 2 diabetes mellitus without complications: Secondary | ICD-10-CM | POA: Insufficient documentation

## 2014-11-19 DIAGNOSIS — R51 Headache: Secondary | ICD-10-CM | POA: Diagnosis present

## 2014-11-19 DIAGNOSIS — N183 Chronic kidney disease, stage 3 (moderate): Secondary | ICD-10-CM | POA: Insufficient documentation

## 2014-11-19 DIAGNOSIS — E039 Hypothyroidism, unspecified: Secondary | ICD-10-CM | POA: Insufficient documentation

## 2014-11-19 DIAGNOSIS — R0682 Tachypnea, not elsewhere classified: Secondary | ICD-10-CM | POA: Diagnosis not present

## 2014-11-19 DIAGNOSIS — Z87442 Personal history of urinary calculi: Secondary | ICD-10-CM | POA: Diagnosis not present

## 2014-11-19 DIAGNOSIS — I5042 Chronic combined systolic (congestive) and diastolic (congestive) heart failure: Secondary | ICD-10-CM | POA: Insufficient documentation

## 2014-11-19 DIAGNOSIS — R531 Weakness: Secondary | ICD-10-CM | POA: Diagnosis not present

## 2014-11-19 DIAGNOSIS — Z79899 Other long term (current) drug therapy: Secondary | ICD-10-CM | POA: Insufficient documentation

## 2014-11-19 DIAGNOSIS — Z9119 Patient's noncompliance with other medical treatment and regimen: Secondary | ICD-10-CM | POA: Diagnosis not present

## 2014-11-19 DIAGNOSIS — Z87438 Personal history of other diseases of male genital organs: Secondary | ICD-10-CM | POA: Diagnosis not present

## 2014-11-19 DIAGNOSIS — D649 Anemia, unspecified: Secondary | ICD-10-CM | POA: Insufficient documentation

## 2014-11-19 DIAGNOSIS — R519 Headache, unspecified: Secondary | ICD-10-CM

## 2014-11-19 LAB — I-STAT CHEM 8, ED
BUN: 27 mg/dL — AB (ref 6–20)
CHLORIDE: 107 mmol/L (ref 101–111)
CREATININE: 1.5 mg/dL — AB (ref 0.61–1.24)
Calcium, Ion: 1.13 mmol/L (ref 1.13–1.30)
GLUCOSE: 111 mg/dL — AB (ref 65–99)
HCT: 46 % (ref 39.0–52.0)
Hemoglobin: 15.6 g/dL (ref 13.0–17.0)
POTASSIUM: 3.9 mmol/L (ref 3.5–5.1)
SODIUM: 139 mmol/L (ref 135–145)
TCO2: 19 mmol/L (ref 0–100)

## 2014-11-19 LAB — I-STAT TROPONIN, ED: Troponin i, poc: 0 ng/mL (ref 0.00–0.08)

## 2014-11-19 LAB — CBC WITH DIFFERENTIAL/PLATELET
Basophils Absolute: 0 10*3/uL (ref 0.0–0.1)
Basophils Relative: 0 % (ref 0–1)
EOS ABS: 0.1 10*3/uL (ref 0.0–0.7)
EOS PCT: 1 % (ref 0–5)
HCT: 44.8 % (ref 39.0–52.0)
HEMOGLOBIN: 15.4 g/dL (ref 13.0–17.0)
LYMPHS ABS: 2.4 10*3/uL (ref 0.7–4.0)
LYMPHS PCT: 32 % (ref 12–46)
MCH: 32.2 pg (ref 26.0–34.0)
MCHC: 34.4 g/dL (ref 30.0–36.0)
MCV: 93.5 fL (ref 78.0–100.0)
MONOS PCT: 9 % (ref 3–12)
Monocytes Absolute: 0.6 10*3/uL (ref 0.1–1.0)
NEUTROS PCT: 58 % (ref 43–77)
Neutro Abs: 4.4 10*3/uL (ref 1.7–7.7)
Platelets: 168 10*3/uL (ref 150–400)
RBC: 4.79 MIL/uL (ref 4.22–5.81)
RDW: 13.7 % (ref 11.5–15.5)
WBC: 7.6 10*3/uL (ref 4.0–10.5)

## 2014-11-19 LAB — URINE MICROSCOPIC-ADD ON

## 2014-11-19 LAB — URINALYSIS, ROUTINE W REFLEX MICROSCOPIC
Bilirubin Urine: NEGATIVE
Glucose, UA: >1000 mg/dL — AB
Ketones, ur: NEGATIVE mg/dL
LEUKOCYTES UA: NEGATIVE
Nitrite: NEGATIVE
PROTEIN: NEGATIVE mg/dL
Specific Gravity, Urine: 1.025 (ref 1.005–1.030)
UROBILINOGEN UA: 0.2 mg/dL (ref 0.0–1.0)
pH: 6 (ref 5.0–8.0)

## 2014-11-19 LAB — PROTIME-INR
INR: 1.87 — ABNORMAL HIGH (ref 0.00–1.49)
Prothrombin Time: 21.4 seconds — ABNORMAL HIGH (ref 11.6–15.2)

## 2014-11-19 MED ORDER — HYDROMORPHONE HCL 1 MG/ML IJ SOLN
1.0000 mg | Freq: Once | INTRAMUSCULAR | Status: AC
  Administered 2014-11-19: 1 mg via INTRAVENOUS
  Filled 2014-11-19: qty 1

## 2014-11-19 MED ORDER — ONDANSETRON HCL 4 MG/2ML IJ SOLN
4.0000 mg | Freq: Once | INTRAMUSCULAR | Status: AC
  Administered 2014-11-19: 4 mg via INTRAVENOUS
  Filled 2014-11-19: qty 2

## 2014-11-19 MED ORDER — SODIUM CHLORIDE 0.9 % IV BOLUS (SEPSIS)
500.0000 mL | Freq: Once | INTRAVENOUS | Status: AC
  Administered 2014-11-19: 500 mL via INTRAVENOUS

## 2014-11-19 MED ORDER — METOCLOPRAMIDE HCL 5 MG/ML IJ SOLN
10.0000 mg | Freq: Once | INTRAMUSCULAR | Status: AC
  Administered 2014-11-19: 10 mg via INTRAVENOUS
  Filled 2014-11-19: qty 2

## 2014-11-19 MED ORDER — DIPHENHYDRAMINE HCL 50 MG/ML IJ SOLN
25.0000 mg | Freq: Once | INTRAMUSCULAR | Status: AC
  Administered 2014-11-19: 25 mg via INTRAVENOUS
  Filled 2014-11-19: qty 1

## 2014-11-19 MED ORDER — MORPHINE SULFATE 4 MG/ML IJ SOLN
6.0000 mg | Freq: Once | INTRAMUSCULAR | Status: AC
  Administered 2014-11-19: 6 mg via INTRAVENOUS
  Filled 2014-11-19: qty 2

## 2014-11-19 NOTE — ED Notes (Signed)
CT aware PA wants pt scan completed ASAP, CT states they will get pt next, PA aware.

## 2014-11-19 NOTE — ED Provider Notes (Signed)
CSN: [id number]     Arrival date & time 11/19/14  1110 History   First MD Initiated Contact with Patient 11/19/14 1114     No chief complaint on file.    (Consider location/radiation/quality/duration/timing/severity/associated sxs/prior Treatment) HPI   64 year old obese male with history of paroxysmal atrial fibrillation on Xarelto, CHF, diabetes, hypertension, chronic kidney disease was brought here by mouth friend from work to the ER for evaluation of headache. Patient reported approximately an hour ago he was sitting at his desk when he developed acute onset of severe sharp throbbing headache to the back of his head and radiates towards his neck. Headache is rated as 10 out of 10, with light sensitivity, and complaining of generalized weakness to both arms and legs bilaterally. He has not try any specific treatment. He denies having similar headaches like this in the past. He complaining of weakness throughout his extremities. He denies having fever, visual changes, neck stiffness, URI symptoms, chest pain, shortness of breath, abdominal pain, vomiting or diarrhea or rash. He took his usual medication today. He denies any prior history of stroke.  Past Medical History  Diagnosis Date  . Hypertension   . PAF (paroxysmal atrial fibrillation)     a. failed DCCV and multiple anti-arrhythmic drugs (multaq/flecainide);   b. s/p  PVI isolation with ablation of AFib 8/13; c. repeat PVI 01/2013;  d. 02/2013 repeat DCCV->Amio load/xarelto.  . Renal calculi   . Chronic combined systolic and diastolic CHF (congestive heart failure)     a. LVEF previously 35% felt to be due tachycardia;  b. 02/2013 Echo: EF 50-55%, no rwma, mildly dil LA/RA, mild to mod MR.  . Diabetes mellitus   . Obesities, morbid   . Hypothyroid   . Gastric ulcer     s/p prior surgery  . Diverticulosis   . Erectile dysfunction   . Obstructive sleep apnea     noncompliant with CPAP  . Atrial flutter     a. s/p RFCA 8/13  .  CKD (chronic kidney disease), stage III     hx of a/c renal failure during episode of diverticulitis 9/13 in [place], [place]  . Anemia    Past Surgical History  Procedure Laterality Date  . Wrist sx  1980    right  . Knee sx  1985    both  . Benign stomach tumor removal  2000  . Thyroidectomy  2009    cysts removal   . Laparoscopic retropubic prostatectomy  02/2007    hx prostate cancer  . Kidney stone removal      multiple  . Tee without cardioversion  08/25/2011    Procedure: TRANSESOPHAGEAL ECHOCARDIOGRAM (TEE);  Surgeon: Jettie Booze, MD;  Location: [place];  Service: Cardiovascular;  Laterality: N/A;  . Cardioversion  08/25/2011    Procedure: CARDIOVERSION;  Surgeon: Jettie Booze, MD;  Location: [place];  Service: Cardiovascular;  Laterality: N/A;  . Tee without cardioversion  11/09/2011    Procedure: TRANSESOPHAGEAL ECHOCARDIOGRAM (TEE);  Surgeon: Jettie Booze, MD;  Location: [facility] ENDOSCOPY;  Service: Cardiovascular;  Laterality: N/A;  h/p in file drawer  . Atrial fibrillation ablation  12/06/11; 02/05/2013    Afib and atrial flutter ablation by Dr Rayann Heman; repeat PVI by Dr Rayann Heman 02/05/2013  . Tee without cardioversion N/A 02/05/2013    Procedure: TRANSESOPHAGEAL ECHOCARDIOGRAM (TEE);  Surgeon: Candee Furbish, MD;  Location: [facility] ENDOSCOPY;  Service: Cardiovascular;  Laterality: N/A;  . Cardioversion N/A 03/05/2013    Procedure: CARDIOVERSION;  Surgeon: Sinclair Grooms, MD;  Location: [place];  Service: Cardiovascular;  Laterality: N/A;  BEDSIDE   . Esophagogastroduodenoscopy N/A 04/09/2013    Procedure: ESOPHAGOGASTRODUODENOSCOPY (EGD) with possible Balloon Dilation;  Surgeon: Garlan Fair, MD;  Location: [facility] ENDOSCOPY;  Service: Endoscopy;  Laterality: N/A;  . Atrial fibrillation ablation N/A 12/06/2011    Procedure: ATRIAL FIBRILLATION ABLATION;  Surgeon: Thompson Grayer, MD;  Location: [facility] CATH LAB;  Service: Cardiovascular;  Laterality: N/A;  . Atrial  fibrillation ablation N/A 02/05/2013    Procedure: ATRIAL FIBRILLATION ABLATION;  Surgeon: Coralyn Mark, MD;  Location: [place] CATH LAB;  Service: Cardiovascular;  Laterality: N/A;   Family History  Problem Relation Age of Onset  . Emphysema Mother   . Lung cancer Father   . Diabetes Brother   . Colon cancer Neg Hx    Social History  Substance Use Topics  . Smoking status: Never Smoker   . Smokeless tobacco: Not on file  . Alcohol Use: No    Review of Systems  All other systems reviewed and are negative.     Allergies  Phenergan; Aspirin; Bystolic; Calcium channel blockers; Prednisone; and Nsaids  Home Medications   Prior to Admission medications   Medication Sig Start Date End Date Taking? Authorizing Provider  albuterol (PROVENTIL) (2.5 MG/3ML) 0.083% nebulizer solution Take 2.5 mg by nebulization every 4 (four) hours as needed for wheezing or shortness of breath.  05/22/13   Historical Provider, MD  amiodarone (PACERONE) 200 MG tablet TAKE 1 TABLET BY MOUTH ON MONDAY, WEDNESDAY, FRIDAY, SATURDAY, & SUNDAY AND TAKE ONE-HALF (1/2) TABLET BY MOUTH ON TUESDAY & THURSDAY 11/11/14   Jettie Booze, MD  Canagliflozin (INVOKANA) 300 MG TABS Take 1 tablet by mouth daily.    Historical Provider, MD  carvedilol (COREG) 25 MG tablet TAKE 1 TABLET BY MOUTH TWICE DAILY 05/05/14   Jettie Booze, MD  Cholecalciferol (VITAMIN D) 2000 UNITS CAPS Take 2,000 Units by mouth daily.    Historical Provider, MD  ferrous sulfate 325 (65 FE) MG tablet Take 1 tablet (325 mg total) by mouth 3 (three) times daily with meals. 02/01/13   Thompson Grayer, MD  fluticasone (FLONASE) 50 MCG/ACT nasal spray Place 2 sprays into both nostrils daily as needed. allergies 01/20/14   Historical Provider, MD  furosemide (LASIX) 40 MG tablet Take 0.5 tablets (20 mg total) by mouth daily. 03/28/14   Thompson Grayer, MD  GLUCOSAMINE PO Take 1,000 mg by mouth daily.     Historical Provider, MD  imipramine (TOFRANIL) 25 MG  tablet Take 50 mg by mouth at bedtime.     Historical Provider, MD  levothyroxine (SYNTHROID, LEVOTHROID) 200 MCG tablet Take 200 mcg by mouth daily.    Historical Provider, MD  losartan (COZAAR) 100 MG tablet Take 1 tablet (100 mg total) by mouth daily. 07/09/14   Jettie Booze, MD  Multiple Vitamins-Minerals (MENS 50+ MULTI VITAMIN/MIN PO) Take 1 tablet by mouth daily.    Historical Provider, MD  Omega-3 Fatty Acids (FISH OIL) 1000 MG CAPS Take 1 capsule by mouth daily.     Historical Provider, MD  pantoprazole (PROTONIX) 40 MG tablet Take 40 mg by mouth daily.     Historical Provider, MD  polycarbophil (FIBERCON) 625 MG tablet Take 1,250 mg by mouth daily.    Historical Provider, MD  potassium chloride SA (K-DUR,KLOR-CON) 20 MEQ tablet Take 2 tablets (40 mEq total) by mouth daily. 07/09/14   Jettie Booze, MD  pravastatin (PRAVACHOL) 20 MG tablet Take 10 mg by mouth at bedtime.     Historical Provider, MD  XARELTO 15 MG TABS tablet TAKE 1 TABLET BY MOUTH ONE TIME DAILY 05/05/14   Jettie Booze, MD  zolpidem (AMBIEN) 10 MG tablet Take 1 tablet by mouth daily as needed for sleep.  04/12/13   Historical Provider, MD   There were no vitals taken for this visit. Physical Exam  Constitutional: He appears well-developed and well-nourished. He appears distressed.  Caucasian male appears to be uncomfortable holding his head.  HENT:  Head: Normocephalic and atraumatic.  Mouth/Throat: Oropharynx is clear and moist.  Eyes: Conjunctivae and EOM are normal. Pupils are equal, round, and reactive to light.  Neck: Neck supple.  Neck with decreased range of motion but no nuchal rigidity. No carotid bruit noted.  Cardiovascular: Normal rate, regular rhythm and intact distal pulses.   Pulmonary/Chest:  Mildly tachypneic without tachycardia. No wheezes, rales, or rhonchi.  Abdominal: Soft. There is tenderness.  Musculoskeletal: He exhibits no edema.  55 strength to bilateral upper extremities,  4 out of 5 strength to bilateral lower extremities.  Neurological: He is alert.  Neurologic exam:  Speech clear, pupils equal round reactive to light, extraocular movements intact  Normal peripheral visual fields Cranial nerves III through XII normal including no facial droop Follows commands, moves all extremities x4, normal strength to bilateral upper and 4/5 strength to lower extremities  Sensation normal to light touch and pinprick Coordination intact, no limb ataxia, finger-nose-finger normal Rapid alternating movements normal No pronator drift Gait not tested   Skin: No rash noted.  Psychiatric: He has a normal mood and affect.  Nursing note and vitals reviewed.   ED Course  Procedures (including critical care time)  Pt presents with acute onset of severe headache with genaralized fatigue.  Sxs concerning for [facility].  Work up initiated.    12:03 PM Patient reports minimal improvement of headache after receiving 6 mg of morphine.  Will give 1mg  of Dilaudid.  Head CT performed.    2:54 PM Patient felt much better after receiving a migraine cocktail. He is back to his normal baseline. He is able to cannulate without difficulty. He is stable for discharge with outpatient follow-up. Return precautions discussed. Work note provided as requested.  Care discussed with Dr. Alvino Chapel   Labs Review Labs Reviewed  PROTIME-INR - Abnormal; Notable for the following:    Prothrombin Time 21.4 (*)    INR 1.87 (*)    All other components within normal limits  URINALYSIS, ROUTINE W REFLEX MICROSCOPIC (NOT AT [facility]) - Abnormal; Notable for the following:    Glucose, UA >1000 (*)    Hgb urine dipstick TRACE (*)    All other components within normal limits  I-STAT CHEM 8, ED - Abnormal; Notable for the following:    BUN 27 (*)    Creatinine, Ser 1.50 (*)    Glucose, Bld 111 (*)    All other components within normal limits  CBC WITH DIFFERENTIAL/PLATELET  URINE MICROSCOPIC-ADD ON  I-STAT  TROPOININ, ED    Imaging Review Ct Head Wo Contrast  11/19/2014   CLINICAL DATA:  Headache  EXAM: CT HEAD WITHOUT CONTRAST  TECHNIQUE: Contiguous axial images were obtained from the base of the skull through the vertex without intravenous contrast.  COMPARISON:  07/24/2013  FINDINGS: Paranasal sinuses and mastoid air cells are unremarkable. No intracranial hemorrhage, mass effect or midline shift. No acute cortical infarction. No mass lesion  is noted on this unenhanced scan. Mild cerebral atrophy. Ventricular size is stable from prior exam.  IMPRESSION: No acute intracranial abnormality.  Mild cerebral atrophy.   Electronically Signed   By: Lahoma Crocker M.D.   On: 11/19/2014 12:20     EKG Interpretation None     ED ECG REPORT   Date: 11/19/2014  Rate: 60  Rhythm: normal sinus rhythm  QRS Axis: normal  Intervals: normal  ST/T Wave abnormalities: nonspecific ST changes  Conduction Disutrbances:none  Narrative Interpretation:   Old EKG Reviewed: unchanged  I have personally reviewed the EKG tracing and agree with the computerized printout as noted.   MDM   Final diagnoses:  Bad headache   BP 116/49 mmHg  Pulse 52  Temp(Src) 98.6 F (37 C) (Oral)  Resp 20  Ht 5\' 8"  (1.727 m)  Wt 221 lb (100.245 kg)  BMI 33.61 kg/m2  SpO2 95%  I have reviewed nursing notes and vital signs. I personally viewed the imaging tests through PACS system and agrees with radiologist's intepretation I reviewed available ER/hospitalization records through the EMR     Domenic Moras, PA-C 11/19/14 [place], MD 11/20/14 [phone]

## 2014-11-19 NOTE — ED Notes (Signed)
PA at bedside.

## 2014-11-19 NOTE — ED Notes (Signed)
Pt presents via POV c/o sudden onset headache and generalized weakness in his arms and legs. Pt was at work, sitting at a desk when this came on suddenly.  Pt reports hx of migraines.  Pt a x 4, NAD, holding back of his head.

## 2014-11-19 NOTE — ED Notes (Signed)
Patient transported to CT 

## 2014-11-19 NOTE — Discharge Instructions (Signed)

## 2014-11-22 ENCOUNTER — Emergency Department (HOSPITAL_COMMUNITY): Payer: Managed Care, Other (non HMO)

## 2014-11-22 ENCOUNTER — Telehealth: Payer: Self-pay | Admitting: Cardiology

## 2014-11-22 ENCOUNTER — Encounter (HOSPITAL_COMMUNITY): Payer: Self-pay | Admitting: Emergency Medicine

## 2014-11-22 ENCOUNTER — Observation Stay (HOSPITAL_COMMUNITY)
Admission: EM | Admit: 2014-11-22 | Discharge: 2014-11-24 | Disposition: A | Discharge status: Home / Self Care | Payer: Managed Care, Other (non HMO) | Attending: Internal Medicine | Admitting: Internal Medicine

## 2014-11-22 DIAGNOSIS — Z7901 Long term (current) use of anticoagulants: Secondary | ICD-10-CM

## 2014-11-22 DIAGNOSIS — I5032 Chronic diastolic (congestive) heart failure: Secondary | ICD-10-CM | POA: Diagnosis present

## 2014-11-22 DIAGNOSIS — N183 Chronic kidney disease, stage 3 unspecified: Secondary | ICD-10-CM | POA: Diagnosis present

## 2014-11-22 DIAGNOSIS — E039 Hypothyroidism, unspecified: Secondary | ICD-10-CM | POA: Diagnosis not present

## 2014-11-22 DIAGNOSIS — I951 Orthostatic hypotension: Secondary | ICD-10-CM | POA: Diagnosis present

## 2014-11-22 DIAGNOSIS — E1122 Type 2 diabetes mellitus with diabetic chronic kidney disease: Secondary | ICD-10-CM | POA: Diagnosis present

## 2014-11-22 DIAGNOSIS — R748 Abnormal levels of other serum enzymes: Secondary | ICD-10-CM | POA: Diagnosis not present

## 2014-11-22 DIAGNOSIS — D751 Secondary polycythemia: Secondary | ICD-10-CM | POA: Diagnosis not present

## 2014-11-22 DIAGNOSIS — N179 Acute kidney failure, unspecified: Secondary | ICD-10-CM | POA: Insufficient documentation

## 2014-11-22 DIAGNOSIS — I152 Hypertension secondary to endocrine disorders: Secondary | ICD-10-CM | POA: Diagnosis present

## 2014-11-22 DIAGNOSIS — Z6834 Body mass index (BMI) 34.0-34.9, adult: Secondary | ICD-10-CM | POA: Insufficient documentation

## 2014-11-22 DIAGNOSIS — Z79899 Other long term (current) drug therapy: Secondary | ICD-10-CM | POA: Insufficient documentation

## 2014-11-22 DIAGNOSIS — E119 Type 2 diabetes mellitus without complications: Secondary | ICD-10-CM | POA: Diagnosis not present

## 2014-11-22 DIAGNOSIS — E785 Hyperlipidemia, unspecified: Secondary | ICD-10-CM | POA: Diagnosis not present

## 2014-11-22 DIAGNOSIS — E875 Hyperkalemia: Secondary | ICD-10-CM | POA: Diagnosis not present

## 2014-11-22 DIAGNOSIS — R531 Weakness: Secondary | ICD-10-CM

## 2014-11-22 DIAGNOSIS — Z8679 Personal history of other diseases of the circulatory system: Secondary | ICD-10-CM

## 2014-11-22 DIAGNOSIS — I5042 Chronic combined systolic (congestive) and diastolic (congestive) heart failure: Secondary | ICD-10-CM | POA: Insufficient documentation

## 2014-11-22 DIAGNOSIS — N1831 Chronic kidney disease, stage 3a: Secondary | ICD-10-CM | POA: Diagnosis present

## 2014-11-22 DIAGNOSIS — E860 Dehydration: Secondary | ICD-10-CM | POA: Diagnosis not present

## 2014-11-22 DIAGNOSIS — I10 Essential (primary) hypertension: Secondary | ICD-10-CM | POA: Diagnosis present

## 2014-11-22 DIAGNOSIS — R55 Syncope and collapse: Secondary | ICD-10-CM | POA: Insufficient documentation

## 2014-11-22 DIAGNOSIS — D649 Anemia, unspecified: Secondary | ICD-10-CM | POA: Diagnosis not present

## 2014-11-22 DIAGNOSIS — G4733 Obstructive sleep apnea (adult) (pediatric): Secondary | ICD-10-CM | POA: Diagnosis not present

## 2014-11-22 DIAGNOSIS — I4891 Unspecified atrial fibrillation: Secondary | ICD-10-CM | POA: Diagnosis not present

## 2014-11-22 DIAGNOSIS — Z9119 Patient's noncompliance with other medical treatment and regimen: Secondary | ICD-10-CM | POA: Insufficient documentation

## 2014-11-22 DIAGNOSIS — I480 Paroxysmal atrial fibrillation: Secondary | ICD-10-CM | POA: Insufficient documentation

## 2014-11-22 DIAGNOSIS — I129 Hypertensive chronic kidney disease with stage 1 through stage 4 chronic kidney disease, or unspecified chronic kidney disease: Secondary | ICD-10-CM | POA: Diagnosis not present

## 2014-11-22 DIAGNOSIS — I4892 Unspecified atrial flutter: Secondary | ICD-10-CM | POA: Diagnosis not present

## 2014-11-22 DIAGNOSIS — Z9889 Other specified postprocedural states: Secondary | ICD-10-CM

## 2014-11-22 LAB — CBC
HCT: 47.7 % (ref 39.0–52.0)
HEMATOCRIT: 53.2 % — AB (ref 39.0–52.0)
HEMOGLOBIN: 19.2 g/dL — AB (ref 13.0–17.0)
Hemoglobin: 16.2 g/dL (ref 13.0–17.0)
MCH: 32.8 pg (ref 26.0–34.0)
MCH: 31.9 pg (ref 26.0–34.0)
MCHC: 36.1 g/dL — ABNORMAL HIGH (ref 30.0–36.0)
MCHC: 34 g/dL (ref 30.0–36.0)
MCV: 90.8 fL (ref 78.0–100.0)
MCV: 93.9 fL (ref 78.0–100.0)
PLATELETS: 228 10*3/uL (ref 150–400)
Platelets: 170 10*3/uL (ref 150–400)
RBC: 5.86 MIL/uL — ABNORMAL HIGH (ref 4.22–5.81)
RBC: 5.08 MIL/uL (ref 4.22–5.81)
RDW: 13.6 % (ref 11.5–15.5)
RDW: 13.6 % (ref 11.5–15.5)
WBC: 10.6 10*3/uL — ABNORMAL HIGH (ref 4.0–10.5)
WBC: 9.2 10*3/uL (ref 4.0–10.5)

## 2014-11-22 LAB — I-STAT TROPONIN, ED: TROPONIN I, POC: 0 ng/mL (ref 0.00–0.08)

## 2014-11-22 LAB — BASIC METABOLIC PANEL
Anion gap: 18 — ABNORMAL HIGH (ref 5–15)
Anion gap: 8 (ref 5–15)
BUN: 22 mg/dL — ABNORMAL HIGH (ref 6–20)
BUN: 21 mg/dL — ABNORMAL HIGH (ref 6–20)
CALCIUM: 9.7 mg/dL (ref 8.9–10.3)
CALCIUM: 8.6 mg/dL — AB (ref 8.9–10.3)
CO2: 18 mmol/L — ABNORMAL LOW (ref 22–32)
CO2: 22 mmol/L (ref 22–32)
CREATININE: 1.58 mg/dL — AB (ref 0.61–1.24)
Chloride: 105 mmol/L (ref 101–111)
Chloride: 112 mmol/L — ABNORMAL HIGH (ref 101–111)
Creatinine, Ser: 1.78 mg/dL — ABNORMAL HIGH (ref 0.61–1.24)
GFR calc non Af Amer: 39 mL/min — ABNORMAL LOW (ref >60)
GFR calc non Af Amer: 45 mL/min — ABNORMAL LOW (ref >60)
GFR, EST AFRICAN AMERICAN: 45 mL/min — AB (ref >60)
GFR, EST AFRICAN AMERICAN: 52 mL/min — AB (ref >60)
GLUCOSE: 102 mg/dL — AB (ref 65–99)
GLUCOSE: 132 mg/dL — AB (ref 65–99)
POTASSIUM: 6.2 mmol/L — AB (ref 3.5–5.1)
POTASSIUM: 3.8 mmol/L (ref 3.5–5.1)
SODIUM: 141 mmol/L (ref 135–145)
Sodium: 142 mmol/L (ref 135–145)

## 2014-11-22 LAB — TROPONIN I
Troponin I: 0.05 ng/mL — ABNORMAL HIGH (ref <0.031)
Troponin I: 0.07 ng/mL — ABNORMAL HIGH (ref <0.031)

## 2014-11-22 LAB — URINALYSIS, ROUTINE W REFLEX MICROSCOPIC
Bilirubin Urine: NEGATIVE
Glucose, UA: >1000 mg/dL — AB
Hgb urine dipstick: NEGATIVE
Ketones, ur: 15 mg/dL — AB
Leukocytes, UA: NEGATIVE
Nitrite: NEGATIVE
Protein, ur: NEGATIVE mg/dL
Specific Gravity, Urine: 1.024 (ref 1.005–1.030)
UROBILINOGEN UA: 1 mg/dL (ref 0.0–1.0)
pH: 6 (ref 5.0–8.0)

## 2014-11-22 LAB — POTASSIUM: Potassium: 3.5 mmol/L (ref 3.5–5.1)

## 2014-11-22 LAB — URINE MICROSCOPIC-ADD ON

## 2014-11-22 MED ORDER — ONDANSETRON HCL 4 MG/2ML IJ SOLN: 4.0000 mg | Freq: Four times a day (QID) | INTRAMUSCULAR | Status: DC | PRN

## 2014-11-22 MED ORDER — RIVAROXABAN 15 MG PO TABS
15.0000 mg | ORAL_TABLET | Freq: Every day | ORAL | Status: DC
  Administered 2014-11-22: 15 mg via ORAL
  Filled 2014-11-22 (×2): qty 1

## 2014-11-22 MED ORDER — SODIUM CHLORIDE 0.9 % IV BOLUS (SEPSIS)
500.0000 mL | Freq: Once | INTRAVENOUS | Status: AC
  Administered 2014-11-22: 500 mL via INTRAVENOUS

## 2014-11-22 MED ORDER — PANTOPRAZOLE SODIUM 40 MG PO TBEC
40.0000 mg | DELAYED_RELEASE_TABLET | Freq: Every day | ORAL | Status: DC
  Administered 2014-11-22 – 2014-11-24 (×3): 40 mg via ORAL
  Filled 2014-11-22 (×3): qty 1

## 2014-11-22 MED ORDER — GUAIFENESIN-DM 100-10 MG/5ML PO SYRP: 5.0000 mL | ORAL_SOLUTION | ORAL | Status: DC | PRN

## 2014-11-22 MED ORDER — LEVOTHYROXINE SODIUM 200 MCG PO TABS
200.0000 ug | ORAL_TABLET | Freq: Every day | ORAL | Status: DC
  Administered 2014-11-23 – 2014-11-24 (×2): 200 ug via ORAL
  Filled 2014-11-22 (×3): qty 1

## 2014-11-22 MED ORDER — ALBUTEROL SULFATE (2.5 MG/3ML) 0.083% IN NEBU: 2.5000 mg | INHALATION_SOLUTION | RESPIRATORY_TRACT | Status: DC | PRN

## 2014-11-22 MED ORDER — OXYCODONE HCL 5 MG PO TABS
5.0000 mg | ORAL_TABLET | ORAL | Status: DC | PRN
  Administered 2014-11-22 – 2014-11-23 (×4): 5 mg via ORAL
  Filled 2014-11-22 (×4): qty 1

## 2014-11-22 MED ORDER — AMIODARONE HCL 200 MG PO TABS
200.0000 mg | ORAL_TABLET | Freq: Every day | ORAL | Status: DC
  Administered 2014-11-23 – 2014-11-24 (×2): 200 mg via ORAL
  Filled 2014-11-22 (×3): qty 1

## 2014-11-22 MED ORDER — ACETAMINOPHEN 650 MG RE SUPP: 650.0000 mg | Freq: Four times a day (QID) | RECTAL | Status: DC | PRN

## 2014-11-22 MED ORDER — SODIUM CHLORIDE 0.9 % IJ SOLN: 3.0000 mL | Freq: Two times a day (BID) | INTRAMUSCULAR | Status: DC

## 2014-11-22 MED ORDER — LORAZEPAM 2 MG/ML IJ SOLN
1.0000 mg | Freq: Once | INTRAMUSCULAR | Status: DC
  Filled 2014-11-22: qty 1

## 2014-11-22 MED ORDER — ACETAMINOPHEN 325 MG PO TABS
650.0000 mg | ORAL_TABLET | Freq: Four times a day (QID) | ORAL | Status: DC | PRN
  Administered 2014-11-22: 650 mg via ORAL
  Filled 2014-11-22: qty 2

## 2014-11-22 MED ORDER — SODIUM CHLORIDE 0.9 % IV BOLUS (SEPSIS)
1000.0000 mL | Freq: Once | INTRAVENOUS | Status: AC
  Administered 2014-11-22: 1000 mL via INTRAVENOUS

## 2014-11-22 MED ORDER — ONDANSETRON HCL 4 MG PO TABS: 4.0000 mg | ORAL_TABLET | Freq: Four times a day (QID) | ORAL | Status: DC | PRN

## 2014-11-22 MED ORDER — AMIODARONE HCL 100 MG PO TABS: 100.0000 mg | ORAL_TABLET | Freq: Every day | ORAL | Status: DC

## 2014-11-22 MED ORDER — PRAVASTATIN SODIUM 10 MG PO TABS
10.0000 mg | ORAL_TABLET | Freq: Every day | ORAL | Status: DC
  Administered 2014-11-22 – 2014-11-23 (×2): 10 mg via ORAL
  Filled 2014-11-22 (×3): qty 1

## 2014-11-22 MED ORDER — SODIUM CHLORIDE 0.9 % IV SOLN
INTRAVENOUS | Status: DC
Start: 2014-11-22 — End: 2014-11-22

## 2014-11-22 MED ORDER — DILTIAZEM HCL 25 MG/5ML IV SOLN
20.0000 mg | Freq: Once | INTRAVENOUS | Status: AC
  Administered 2014-11-22: 20 mg via INTRAVENOUS
  Filled 2014-11-22: qty 5

## 2014-11-22 MED ORDER — SODIUM CHLORIDE 0.9 % IV SOLN
INTRAVENOUS | Status: DC
  Administered 2014-11-22 – 2014-11-23 (×3): via INTRAVENOUS

## 2014-11-22 NOTE — ED Notes (Signed)
Admitting at bedside 

## 2014-11-22 NOTE — ED Notes (Signed)
Pt has taken one of wife's xanax approx 2hours ago. (0.5mg )

## 2014-11-22 NOTE — ED Notes (Signed)
Attempted report 

## 2014-11-22 NOTE — Telephone Encounter (Signed)
Pt's wife called, increased HR and weakness after working in the yard. HR 130, B/P 98. He just took his Coreg 25 mg. I suggested her go lay down and let Coreg work. Don't take Cozaar today. She asked about a Xanax and i told I thought that would be fine.  Kerin Ransom PA-C 11/22/2014 9:42 AM

## 2014-11-22 NOTE — ED Notes (Signed)
Dr Ashok Cordia at bedside to evaluate pt after fall.  Small scratches noted to pt's lower left back.

## 2014-11-22 NOTE — Progress Notes (Signed)
ANTICOAGULATION CONSULT NOTE - Initial Consult  Pharmacy Consult for rivaroxaban Indication: atrial fibrillation  Allergies  Allergen Reactions  . Phenergan [Promethazine Hcl] Itching    Hallucination   . Aspirin     Hx of ulcer   . Bystolic [Nebivolol Hcl] Swelling    Bradycardia   . Calcium Channel Blockers Swelling    LE edema  . Prednisone     Hx of ulcer  . Nsaids     Hx ulcer    Patient Measurements: Height: 5\' 8"  (172.7 cm) Weight: 226 lb 13.7 oz (102.9 kg) IBW/kg (Calculated) : 68.4  Vital Signs: Temp: 97.9 F (36.6 C) (08/13 1606) Temp Source: Oral (08/13 1606) BP: 131/79 mmHg (08/13 1606) Pulse Rate: 68 (08/13 1606)  Labs:  Recent Labs  11/22/14 1200  HGB 19.2*  HCT 53.2*  PLT 228  CREATININE 1.78*    Estimated Creatinine Clearance: 48.7 mL/min (by C-G formula based on Cr of 1.78).   Medical History: Past Medical History  Diagnosis Date  . Hypertension   . PAF (paroxysmal atrial fibrillation)     a. failed DCCV and multiple anti-arrhythmic drugs (multaq/flecainide);   b. s/p  PVI isolation with ablation of AFib 8/13; c. repeat PVI 01/2013;  d. 02/2013 repeat DCCV->Amio load/xarelto.  . Renal calculi   . Chronic combined systolic and diastolic CHF (congestive heart failure)     a. LVEF previously 35% felt to be due tachycardia;  b. 02/2013 Echo: EF 50-55%, no rwma, mildly dil LA/RA, mild to mod MR.  . Diabetes mellitus   . Obesities, morbid   . Hypothyroid   . Gastric ulcer     s/p prior surgery  . Diverticulosis   . Erectile dysfunction   . Obstructive sleep apnea     noncompliant with CPAP  . Atrial flutter     a. s/p RFCA 8/13  . CKD (chronic kidney disease), stage III     hx of a/c renal failure during episode of diverticulitis 9/13 in [place], [place]  . Anemia     Assessment: 64 yo male presenting with a fib  PMH: a flutter and persistent a fib (hx of ablation and cardioversion) on xarelto, CKD III, HTN, CHF, DM,  hypothyroid  AC: on xarelto 15 mg daily PTA for a fib  Renal: SCr 1.78 (unsure of baseline, was 1.5 11/19/14 and 1.19 on 07/23/13). CrCl ~ 48 ml/min  Heme: H&H 19.2/53.2, Plt 228   Plan:  Continue home rivaroxaban at 15 mg with supper Monitor renal function Education patient  Levester Fresh, PharmD, BCPS Clinical Pharmacist Pager [phone] 11/22/2014 5:28 PM

## 2014-11-22 NOTE — ED Notes (Addendum)
Pt now reporting 6/10 back pain from fall.  Paged Dr Sloan Leiter.  Pt denied telling him while he was in the room of his pain.

## 2014-11-22 NOTE — Consult Note (Signed)
CARDIOLOGY CONSULT NOTE  Patient ID: [name] [name] MRN: [id number] DOB/AGE: 64-Oct-1952 64 y.o.  Admit date: 11/22/2014 Primary Physician Donnajean Lopes, MD Primary Cardiologist Dr. Rayann Heman  Chief Complaint  Palpitations and dizziness  HPI:  The patient has a past history of atrial fib as below.   He called today because he was having dizziness.  In the ED he was found to atrial fib with RVR.   He was treated with Cardizem.   He did have a low BP and had syncope when he stood in radiology.   He has significant orthostatic symptoms and BP drop.   In the ED he is found to have an increased creat above his baseline.  He reports that he went into atrial fib last night.  He can feel the bed shaking with this happens.  He gets profoundly weak.  He feels the palpitations and gets mildly SOB.  He denies any chest pain, neck or arm pain.  No PND or orthopnea.   Of note he has had his dose of amiodarone reduced slightly in the last several weeks.    Past Medical History  Diagnosis Date  . Hypertension   . PAF (paroxysmal atrial fibrillation)     a. failed DCCV and multiple anti-arrhythmic drugs (multaq/flecainide);   b. s/p  PVI isolation with ablation of AFib 8/13; c. repeat PVI 01/2013;  d. 02/2013 repeat DCCV->Amio load/xarelto.  . Renal calculi   . Chronic combined systolic and diastolic CHF (congestive heart failure)     a. LVEF previously 35% felt to be due tachycardia;  b. 02/2013 Echo: EF 50-55%, no rwma, mildly dil LA/RA, mild to mod MR.  . Diabetes mellitus   . Obesities, morbid   . Hypothyroid   . Gastric ulcer     s/p prior surgery  . Diverticulosis   . Erectile dysfunction   . Obstructive sleep apnea     noncompliant with CPAP  . Atrial flutter     a. s/p RFCA 8/13  . CKD (chronic kidney disease), stage III     hx of a/c renal failure during episode of diverticulitis 9/13 in [place], [place]  . Anemia     Past Surgical History  Procedure Laterality Date  . Wrist sx   1980    right  . Knee sx  1985    both  . Benign stomach tumor removal  2000  . Thyroidectomy  2009    cysts removal   . Laparoscopic retropubic prostatectomy  02/2007    hx prostate cancer  . Kidney stone removal      multiple  . Tee without cardioversion  08/25/2011    Procedure: TRANSESOPHAGEAL ECHOCARDIOGRAM (TEE);  Surgeon: Jettie Booze, MD;  Location: [place];  Service: Cardiovascular;  Laterality: N/A;  . Cardioversion  08/25/2011    Procedure: CARDIOVERSION;  Surgeon: Jettie Booze, MD;  Location: [place];  Service: Cardiovascular;  Laterality: N/A;  . Tee without cardioversion  11/09/2011    Procedure: TRANSESOPHAGEAL ECHOCARDIOGRAM (TEE);  Surgeon: Jettie Booze, MD;  Location: [facility] ENDOSCOPY;  Service: Cardiovascular;  Laterality: N/A;  h/p in file drawer  . Atrial fibrillation ablation  12/06/11; 02/05/2013    Afib and atrial flutter ablation by Dr Rayann Heman; repeat PVI by Dr Rayann Heman 02/05/2013  . Tee without cardioversion N/A 02/05/2013    Procedure: TRANSESOPHAGEAL ECHOCARDIOGRAM (TEE);  Surgeon: Candee Furbish, MD;  Location: [facility] ENDOSCOPY;  Service: Cardiovascular;  Laterality: N/A;  . Cardioversion N/A 03/05/2013  Procedure: CARDIOVERSION;  Surgeon: Sinclair Grooms, MD;  Location: [place];  Service: Cardiovascular;  Laterality: N/A;  BEDSIDE   . Esophagogastroduodenoscopy N/A 04/09/2013    Procedure: ESOPHAGOGASTRODUODENOSCOPY (EGD) with possible Balloon Dilation;  Surgeon: Garlan Fair, MD;  Location: [facility] ENDOSCOPY;  Service: Endoscopy;  Laterality: N/A;  . Atrial fibrillation ablation N/A 12/06/2011    Procedure: ATRIAL FIBRILLATION ABLATION;  Surgeon: Thompson Grayer, MD;  Location: [facility] CATH LAB;  Service: Cardiovascular;  Laterality: N/A;  . Atrial fibrillation ablation N/A 02/05/2013    Procedure: ATRIAL FIBRILLATION ABLATION;  Surgeon: Coralyn Mark, MD;  Location: [place] CATH LAB;  Service: Cardiovascular;  Laterality: N/A;    Allergies  Allergen  Reactions  . Phenergan [Promethazine Hcl] Itching    Hallucination   . Aspirin     Hx of ulcer   . Bystolic [Nebivolol Hcl] Swelling    Bradycardia   . Calcium Channel Blockers Swelling    LE edema  . Prednisone     Hx of ulcer  . Nsaids     Hx ulcer   Prescriptions prior to admission  Medication Sig Dispense Refill Last Dose  . albuterol (PROVENTIL) (2.5 MG/3ML) 0.083% nebulizer solution Take 2.5 mg by nebulization 2 (two) times daily as needed for wheezing or shortness of breath.    8 months  . amiodarone (PACERONE) 200 MG tablet TAKE 1 TABLET BY MOUTH ON MONDAY, WEDNESDAY, FRIDAY, SATURDAY, & SUNDAY AND TAKE ONE-HALF (1/2) TABLET BY MOUTH ON TUESDAY & THURSDAY 78 tablet 1 11/22/2014 at Unknown time  . Canagliflozin (INVOKANA) 300 MG TABS Take 1 tablet by mouth daily.   11/21/2014 at Unknown time  . carvedilol (COREG) 25 MG tablet TAKE 1 TABLET BY MOUTH TWICE DAILY 180 tablet 1 11/22/2014 at 0930  . Cholecalciferol (VITAMIN D) 2000 UNITS CAPS Take 2,000 Units by mouth daily.   11/21/2014 at Unknown time  . ferrous sulfate 325 (65 FE) MG tablet Take 1 tablet (325 mg total) by mouth 3 (three) times daily with meals. (Patient taking differently: Take 325 mg by mouth daily with breakfast. ) 90 tablet 1 11/21/2014 at Unknown time  . furosemide (LASIX) 40 MG tablet Take 0.5 tablets (20 mg total) by mouth daily. 15 tablet 5 11/21/2014 at Unknown time  . GLUCOSAMINE PO Take 1,000 mg by mouth daily.    11/21/2014 at Unknown time  . imipramine (TOFRANIL) 25 MG tablet Take 50 mg by mouth at bedtime.    11/21/2014 at Unknown time  . levothyroxine (SYNTHROID, LEVOTHROID) 200 MCG tablet Take 200 mcg by mouth daily.   11/21/2014 at Unknown time  . losartan (COZAAR) 100 MG tablet Take 1 tablet (100 mg total) by mouth daily. 90 tablet 1 11/21/2014 at Unknown time  . Multiple Vitamins-Minerals (MENS 50+ MULTI VITAMIN/MIN PO) Take 1 tablet by mouth daily.   11/21/2014 at Unknown time  . pantoprazole (PROTONIX) 40  MG tablet Take 40 mg by mouth daily.    11/21/2014 at Unknown time  . polycarbophil (FIBERCON) 625 MG tablet Take 1,250 mg by mouth daily.   11/21/2014 at Unknown time  . potassium chloride SA (K-DUR,KLOR-CON) 20 MEQ tablet Take 2 tablets (40 mEq total) by mouth daily. (Patient taking differently: Take 40 mEq by mouth 2 (two) times daily. ) 180 tablet 1 11/22/2014 at Unknown time  . pravastatin (PRAVACHOL) 20 MG tablet Take 10 mg by mouth at bedtime.    11/21/2014 at Unknown time  . XARELTO 15 MG TABS tablet TAKE 1  TABLET BY MOUTH ONE TIME DAILY (Patient taking differently: TAKE 1 TABLET BY MOUTH every evening.) 90 tablet 1 11/21/2014 at Unknown time   Family History  Problem Relation Age of Onset  . Emphysema Mother   . Lung cancer Father   . Diabetes Brother   . Colon cancer Neg Hx     Social History   Social History  . Marital Status: Married    Spouse Name: N/A  . Number of Children: N/A  . Years of Education: N/A   Occupational History  . Not on file.   Social History Main Topics  . Smoking status: Never Smoker   . Smokeless tobacco: Not on file  . Alcohol Use: No  . Drug Use: No  . Sexual Activity: Yes   Other Topics Concern  . Not on file   Social History Narrative   Lives in [place].  Works as a Librarian, academic at [employer]:    As stated in the HPI and negative for all other systems.  Physical Exam: Blood pressure 131/79, pulse 68, temperature 97.9 F (36.6 C), temperature source Oral, resp. rate 19, height 5\' 8"  (1.727 m), weight 226 lb 13.7 oz (102.9 kg), SpO2 100 %.  GENERAL:  Well appearing HEENT:  Pupils equal round and reactive, fundi not visualized, oral mucosa unremarkable NECK:  No jugular venous distention, waveform within normal limits, carotid upstroke brisk and symmetric, no bruits, no thyromegaly LYMPHATICS:  No cervical, inguinal adenopathy LUNGS:  Clear to auscultation bilaterally BACK:  No CVA tenderness CHEST:  Unremarkable HEART:   PMI not displaced or sustained,S1 and S2 within normal limits, no S3, no clicks, no rubs, no murmurs, irregular ABD:  Flat, positive bowel sounds normal in frequency in pitch, no bruits, no rebound, no guarding, no midline pulsatile mass, no hepatomegaly, no splenomegaly EXT:  2 plus pulses throughout, no edema, no cyanosis no clubbing SKIN:  No rashes no nodules NEURO:  Cranial nerves II through XII grossly intact, motor grossly intact throughout PSYCH:  Cognitively intact, oriented to person place and time   Labs: Lab Results  Component Value Date   BUN 22* 11/22/2014   Lab Results  Component Value Date   CREATININE 1.78* 11/22/2014   Lab Results  Component Value Date   NA 141 11/22/2014   K 3.5 11/22/2014   CL 105 11/22/2014   CO2 18* 11/22/2014    Lab Results  Component Value Date   WBC 10.6* 11/22/2014   HGB 19.2* 11/22/2014   HCT 53.2* 11/22/2014   MCV 90.8 11/22/2014   PLT 228 11/22/2014     Radiology:   CXR: Interval resolved pulmonary interstitial edema. Lung volumes remain normal. Normal cardiac size and mediastinal contours. No pneumothorax, pleural effusion or confluent pulmonary opacity. No acute osseous abnormality identified. Evidence of previous Thyroidectomy.  EKG:  Atrial flutter with 4:1 conduction with rate 69.  Poor anterior R wave progression.  No acute ST T wave changes.   ASSESSMENT AND PLAN:   ATRIAL FIB:  He is very symptomatic with this.  I will, for now, increase to PO amiodarone and add a very low dose of beta blocker. (He was on a higher dose of Coreg) If we cannot reduce his symptoms he should have DCCV attempted on Monday.  I will forward to Dr. Rayann Heman.  Ultimately he might be referred to [facility] for further attempts at ablation.    CHRONIC DIASTOLIC HF:  Seems to be euvolemic if not  dehydrated as below.    SYNCOPE:   Probably some slight volume deficit.  He was in the yard briefly this morning.  This combined with atrial fib/flutter caused  an orthostatic BP drop.     SignedMinus Breeding 11/22/2014, 4:46 PM

## 2014-11-22 NOTE — ED Provider Notes (Signed)
CSN: [id number]     Arrival date & time 11/22/14  1133 History   First MD Initiated Contact with Patient 11/22/14 1206     Chief Complaint  Patient presents with  . Atrial Fibrillation     (Consider location/radiation/quality/duration/timing/severity/associated sxs/prior Treatment) Patient is a 64 y.o. male presenting with atrial fibrillation. The history is provided by the patient.  Atrial Fibrillation Pertinent negatives include no chest pain, no abdominal pain, no headaches and no shortness of breath.  Patient w hx afib, c/o 'heart going out of rhythm' last night. Hx countless episodes afib in past, states ablation and cardioversion didn't help.  On xarelto. Compliant w normal meds.  Pt felt fast, irregular heartbeat last pm.  At rest. No specific exacerbating or alleviating factors. +sob. No nv or diaphoresis.  Symptoms persist today. No chest pain or discomfort. No recent febrile illness. No hx cad.      Past Medical History  Diagnosis Date  . Hypertension   . PAF (paroxysmal atrial fibrillation)     a. failed DCCV and multiple anti-arrhythmic drugs (multaq/flecainide);   b. s/p  PVI isolation with ablation of AFib 8/13; c. repeat PVI 01/2013;  d. 02/2013 repeat DCCV->Amio load/xarelto.  . Renal calculi   . Chronic combined systolic and diastolic CHF (congestive heart failure)     a. LVEF previously 35% felt to be due tachycardia;  b. 02/2013 Echo: EF 50-55%, no rwma, mildly dil LA/RA, mild to mod MR.  . Diabetes mellitus   . Obesities, morbid   . Hypothyroid   . Gastric ulcer     s/p prior surgery  . Diverticulosis   . Erectile dysfunction   . Obstructive sleep apnea     noncompliant with CPAP  . Atrial flutter     a. s/p RFCA 8/13  . CKD (chronic kidney disease), stage III     hx of a/c renal failure during episode of diverticulitis 9/13 in [place], [place]  . Anemia    Past Surgical History  Procedure Laterality Date  . Wrist sx  1980    right  . Knee sx  1985     both  . Benign stomach tumor removal  2000  . Thyroidectomy  2009    cysts removal   . Laparoscopic retropubic prostatectomy  02/2007    hx prostate cancer  . Kidney stone removal      multiple  . Tee without cardioversion  08/25/2011    Procedure: TRANSESOPHAGEAL ECHOCARDIOGRAM (TEE);  Surgeon: Jettie Booze, MD;  Location: [place];  Service: Cardiovascular;  Laterality: N/A;  . Cardioversion  08/25/2011    Procedure: CARDIOVERSION;  Surgeon: Jettie Booze, MD;  Location: [place];  Service: Cardiovascular;  Laterality: N/A;  . Tee without cardioversion  11/09/2011    Procedure: TRANSESOPHAGEAL ECHOCARDIOGRAM (TEE);  Surgeon: Jettie Booze, MD;  Location: [facility] ENDOSCOPY;  Service: Cardiovascular;  Laterality: N/A;  h/p in file drawer  . Atrial fibrillation ablation  12/06/11; 02/05/2013    Afib and atrial flutter ablation by Dr Rayann Heman; repeat PVI by Dr Rayann Heman 02/05/2013  . Tee without cardioversion N/A 02/05/2013    Procedure: TRANSESOPHAGEAL ECHOCARDIOGRAM (TEE);  Surgeon: Candee Furbish, MD;  Location: [facility] ENDOSCOPY;  Service: Cardiovascular;  Laterality: N/A;  . Cardioversion N/A 03/05/2013    Procedure: CARDIOVERSION;  Surgeon: Sinclair Grooms, MD;  Location: [place];  Service: Cardiovascular;  Laterality: N/A;  BEDSIDE   . Esophagogastroduodenoscopy N/A 04/09/2013    Procedure: ESOPHAGOGASTRODUODENOSCOPY (EGD) with possible Balloon  Dilation;  Surgeon: Garlan Fair, MD;  Location: [facility] ENDOSCOPY;  Service: Endoscopy;  Laterality: N/A;  . Atrial fibrillation ablation N/A 12/06/2011    Procedure: ATRIAL FIBRILLATION ABLATION;  Surgeon: Thompson Grayer, MD;  Location: [facility] CATH LAB;  Service: Cardiovascular;  Laterality: N/A;  . Atrial fibrillation ablation N/A 02/05/2013    Procedure: ATRIAL FIBRILLATION ABLATION;  Surgeon: Coralyn Mark, MD;  Location: [facility] CATH LAB;  Service: Cardiovascular;  Laterality: N/A;   Family History  Problem Relation Age of Onset  .  Emphysema Mother   . Lung cancer Father   . Diabetes Brother   . Colon cancer Neg Hx    Social History  Substance Use Topics  . Smoking status: Never Smoker   . Smokeless tobacco: None  . Alcohol Use: No    Review of Systems  Constitutional: Negative for fever and chills.  HENT: Negative for sore throat.   Eyes: Negative for redness.  Respiratory: Negative for cough and shortness of breath.   Cardiovascular: Positive for palpitations. Negative for chest pain and leg swelling.  Gastrointestinal: Negative for vomiting, abdominal pain and diarrhea.  Genitourinary: Negative for dysuria and flank pain.  Musculoskeletal: Negative for back pain and neck pain.  Skin: Negative for rash.  Neurological: Negative for syncope and headaches.  Hematological: Does not bruise/bleed easily.  Psychiatric/Behavioral: Negative for confusion.      Allergies  Phenergan; Aspirin; Bystolic; Calcium channel blockers; Prednisone; and Nsaids  Home Medications   Prior to Admission medications   Medication Sig Start Date End Date Taking? Authorizing Provider  albuterol (PROVENTIL) (2.5 MG/3ML) 0.083% nebulizer solution Take 2.5 mg by nebulization every 4 (four) hours as needed for wheezing or shortness of breath.  05/22/13   Historical Provider, MD  amiodarone (PACERONE) 200 MG tablet TAKE 1 TABLET BY MOUTH ON MONDAY, WEDNESDAY, FRIDAY, SATURDAY, & SUNDAY AND TAKE ONE-HALF (1/2) TABLET BY MOUTH ON TUESDAY & THURSDAY 11/11/14   Jettie Booze, MD  Canagliflozin (INVOKANA) 300 MG TABS Take 1 tablet by mouth daily.    Historical Provider, MD  carvedilol (COREG) 25 MG tablet TAKE 1 TABLET BY MOUTH TWICE DAILY 05/05/14   Jettie Booze, MD  Cholecalciferol (VITAMIN D) 2000 UNITS CAPS Take 2,000 Units by mouth daily.    Historical Provider, MD  ferrous sulfate 325 (65 FE) MG tablet Take 1 tablet (325 mg total) by mouth 3 (three) times daily with meals. Patient taking differently: Take 325 mg by mouth  daily with breakfast.  02/01/13   Thompson Grayer, MD  furosemide (LASIX) 40 MG tablet Take 0.5 tablets (20 mg total) by mouth daily. 03/28/14   Thompson Grayer, MD  GLUCOSAMINE PO Take 1,000 mg by mouth daily.     Historical Provider, MD  imipramine (TOFRANIL) 25 MG tablet Take 50 mg by mouth at bedtime.     Historical Provider, MD  levothyroxine (SYNTHROID, LEVOTHROID) 200 MCG tablet Take 200 mcg by mouth daily.    Historical Provider, MD  losartan (COZAAR) 100 MG tablet Take 1 tablet (100 mg total) by mouth daily. 07/09/14   Jettie Booze, MD  Multiple Vitamins-Minerals (MENS 50+ MULTI VITAMIN/MIN PO) Take 1 tablet by mouth daily.    Historical Provider, MD  Omega-3 Fatty Acids (FISH OIL) 1000 MG CAPS Take 1 capsule by mouth daily.     Historical Provider, MD  pantoprazole (PROTONIX) 40 MG tablet Take 40 mg by mouth daily.     Historical Provider, MD  polycarbophil (FIBERCON) 625 MG tablet  Take 1,250 mg by mouth daily.    Historical Provider, MD  potassium chloride SA (K-DUR,KLOR-CON) 20 MEQ tablet Take 2 tablets (40 mEq total) by mouth daily. Patient taking differently: Take 40 mEq by mouth 2 (two) times daily.  07/09/14   Jettie Booze, MD  pravastatin (PRAVACHOL) 20 MG tablet Take 10 mg by mouth at bedtime.     Historical Provider, MD  XARELTO 15 MG TABS tablet TAKE 1 TABLET BY MOUTH ONE TIME DAILY 05/05/14   Jettie Booze, MD   BP 103/86 mmHg  Pulse 132  Temp(Src) 97.8 F (36.6 C)  Resp 32  SpO2 100% Physical Exam  Constitutional: He is oriented to person, place, and time. He appears well-developed and well-nourished. No distress.  HENT:  Mouth/Throat: Oropharynx is clear and moist.  Eyes: Conjunctivae are normal. No scleral icterus.  Neck: Neck supple. No tracheal deviation present. No thyromegaly present.  Cardiovascular: Normal rate, normal heart sounds and intact distal pulses.  Exam reveals no gallop and no friction rub.   No murmur heard. Irregular rhythm   Pulmonary/Chest: Effort normal and breath sounds normal. No accessory muscle usage. No respiratory distress.  Abdominal: He exhibits no distension. There is no tenderness.  Musculoskeletal: Normal range of motion. He exhibits no edema or tenderness.  Neurological: He is alert and oriented to person, place, and time.  Skin: Skin is warm and dry. He is not diaphoretic.  Psychiatric:  Mildly anxious  Nursing note and vitals reviewed.   ED Course  Procedures (including critical care time) Labs Review  Results for orders placed or performed during the hospital encounter of 42/68/34  Basic metabolic panel  Result Value Ref Range   Sodium 141 135 - 145 mmol/L   Potassium 6.2 (HH) 3.5 - 5.1 mmol/L   Chloride 105 101 - 111 mmol/L   CO2 18 (L) 22 - 32 mmol/L   Glucose, Bld 102 (H) 65 - 99 mg/dL   BUN 22 (H) 6 - 20 mg/dL   Creatinine, Ser 1.78 (H) 0.61 - 1.24 mg/dL   Calcium 9.7 8.9 - 10.3 mg/dL   GFR calc non Af Amer 39 (L) >60 mL/min   GFR calc Af Amer 45 (L) >60 mL/min   Anion gap 18 (H) 5 - 15  CBC  Result Value Ref Range   WBC 10.6 (H) 4.0 - 10.5 K/uL   RBC 5.86 (H) 4.22 - 5.81 MIL/uL   Hemoglobin 19.2 (H) 13.0 - 17.0 g/dL   HCT 53.2 (H) 39.0 - 52.0 %   MCV 90.8 78.0 - 100.0 fL   MCH 32.8 26.0 - 34.0 pg   MCHC 36.1 (H) 30.0 - 36.0 g/dL   RDW 13.6 11.5 - 15.5 %   Platelets 228 150 - 400 K/uL  Urinalysis, Routine w reflex microscopic (not at [facility])  Result Value Ref Range   Color, Urine YELLOW YELLOW   APPearance CLEAR CLEAR   Specific Gravity, Urine 1.024 1.005 - 1.030   pH 6.0 5.0 - 8.0   Glucose, UA >1000 (A) NEGATIVE mg/dL   Hgb urine dipstick NEGATIVE NEGATIVE   Bilirubin Urine NEGATIVE NEGATIVE   Ketones, ur 15 (A) NEGATIVE mg/dL   Protein, ur NEGATIVE NEGATIVE mg/dL   Urobilinogen, UA 1.0 0.0 - 1.0 mg/dL   Nitrite NEGATIVE NEGATIVE   Leukocytes, UA NEGATIVE NEGATIVE  Urine microscopic-add on  Result Value Ref Range   Squamous Epithelial / LPF RARE RARE   WBC,  UA 0-2 <3 WBC/hpf   RBC /  HPF 0-2 <3 RBC/hpf   Bacteria, UA FEW (A) RARE   Casts HYALINE CASTS (A) NEGATIVE  I-Stat Troponin, ED (not at [facility])  Result Value Ref Range   Troponin i, poc 0.00 0.00 - 0.08 ng/mL   Comment 3           Dg Chest 2 View  11/22/2014   CLINICAL DATA:  64 year old male with cardiac arrhythmia. Chest pain radiating to the right arm. Initial encounter.  EXAM: CHEST  2 VIEW  COMPARISON:  03/04/2013 and earlier.  FINDINGS: Interval resolved pulmonary interstitial edema. Lung volumes remain normal. Normal cardiac size and mediastinal contours. No pneumothorax, pleural effusion or confluent pulmonary opacity. No acute osseous abnormality identified. Evidence of previous thyroidectomy.  IMPRESSION: No acute cardiopulmonary abnormality.   Electronically Signed   By: Genevie Ann M.D.   On: 11/22/2014 13:53   Ct Head Wo Contrast  11/19/2014   CLINICAL DATA:  Headache  EXAM: CT HEAD WITHOUT CONTRAST  TECHNIQUE: Contiguous axial images were obtained from the base of the skull through the vertex without intravenous contrast.  COMPARISON:  07/24/2013  FINDINGS: Paranasal sinuses and mastoid air cells are unremarkable. No intracranial hemorrhage, mass effect or midline shift. No acute cortical infarction. No mass lesion is noted on this unenhanced scan. Mild cerebral atrophy. Ventricular size is stable from prior exam.  IMPRESSION: No acute intracranial abnormality.  Mild cerebral atrophy.   Electronically Signed   By: Lahoma Crocker M.D.   On: 11/19/2014 12:20       I, Kavian Peters E, personally reviewed and evaluated these images and lab results as part of my medical decision-making.   EKG Interpretation   Date/Time:  Saturday November 22 2014 11:35:31 EDT Ventricular Rate:  132 PR Interval:    QRS Duration: 90 QT Interval:  312 QTC Calculation: 462 R Axis:   18 Text Interpretation:  Sinus rhythm Nonspecific ST abnormality Confirmed by  Ashok Cordia  MD, Lennette Bihari ([id number]) on 11/22/2014 12:08:21  PM      MDM   Iv ns. Continuous pulse ox and monitor. Ecg. Labs.  Reviewed nursing notes and prior charts for additional history.   In radiology, pt w syncopal episode when standing for xr.  No head injury, fell onto/into sretcher. Recheck spine nt. No apparent injury.  Monitor. Repeat ecg.  Initially hr stayed in 130's.  cardizem bolus.  Recheck bp soft, drip held, iv ns bolus.  Additional iv ns.  Hr remains controlled. Aflutter. w orthostatics, unable to stand, +orthostatic drop in bp even w sitting upright. Ivf.   Recheck pt, no cp or sob.  Given recurrent syncope/near syncope, aflutter, aki, will admit for obs.     Lajean Saver, MD 11/22/14 [phone]

## 2014-11-22 NOTE — ED Notes (Signed)
During orthostatic vital signs pt reported feeling fussy while sitting and then dizzy on standing.  Pt immediately sat back to bed, LOC for 2-3 seconds and then was able to lean himself back into the bed with assistance.  Pt has been advised to not stand again without at least 2 staff members present.

## 2014-11-22 NOTE — ED Notes (Signed)
States "heart out of rhythm last night-- hx afib-- c/o right arm pain x 1 hour and  Half" pt breathing fast, encouraged to slow breathing down-- denies chest pain

## 2014-11-22 NOTE — ED Notes (Signed)
Pt unable to complete ortho vitals

## 2014-11-22 NOTE — ED Notes (Signed)
Radiology reports pt stood for the x-ray and blacked out, falling hitting his left side.  Notified Dr Ashok Cordia.

## 2014-11-22 NOTE — H&P (Addendum)
PATIENT DETAILS Name: [name] [name] Age: 64 y.o. Sex: male Date of Birth: 1950/10/05 Admit Date: 11/22/2014 HUT:[id number] G, MD Referring Physician:Dr Stienl   CHIEF COMPLAINT:  Palpitations, dizziness on standing, right arm pain since yesterday evening  HPI: [name] [name] is a 64 y.o. male with a Past Medical History of paroxysmal atrial fibrillation (failed ablation/DCCV, failed multiple antiarrhythmics-multaq-maintained on amiodarone) who presents today with the above noted complaint. Per patient, yesterday evening he started having palpitations that were associated with dizziness, shortness of breath and right arm pain. He continued to have these symptoms intermittently throughout the night. Per patient, when he stood up at night to go to the bathroom his dizziness worsened. Symptoms persisted this morning- that he was having difficulty walking and needed assistance. He was subsequently brought to the emergency room where he was found to have atrial fibrillation with RVR, given Cardizem bolus. He was then found to have soft blood pressure with orthostatic drops. When he was in radiology, patient experienced a syncopal episode upon standing for the chest x-ray, per ED M.D. patient fell into a stretcher. He currently is in atrial fibrillation/atrial flutter but rate controlled. Further evaluation with labs revealed hyperkalemia, worsening of his chronic kidney disease and polycythemia. I was then asked to admit this patient for further evaluation and treatment During my evaluation, patient appeared comfortable, but upon sitting up he was still dizzy. Patient denied any chest pain, he also denied any nausea, vomiting or diarrhea in the past few days.   ALLERGIES:   Allergies  Allergen Reactions  . Phenergan [Promethazine Hcl] Itching    Hallucination   . Aspirin     Hx of ulcer   . Bystolic [Nebivolol Hcl]     Bradycardia   . Calcium Channel Blockers     LE  edema  . Prednisone     Hx of ulcer  . Nsaids     Hx ulcer    PAST MEDICAL HISTORY: Past Medical History  Diagnosis Date  . Hypertension   . PAF (paroxysmal atrial fibrillation)     a. failed DCCV and multiple anti-arrhythmic drugs (multaq/flecainide);   b. s/p  PVI isolation with ablation of AFib 8/13; c. repeat PVI 01/2013;  d. 02/2013 repeat DCCV->Amio load/xarelto.  . Renal calculi   . Chronic combined systolic and diastolic CHF (congestive heart failure)     a. LVEF previously 35% felt to be due tachycardia;  b. 02/2013 Echo: EF 50-55%, no rwma, mildly dil LA/RA, mild to mod MR.  . Diabetes mellitus   . Obesities, morbid   . Hypothyroid   . Gastric ulcer     s/p prior surgery  . Diverticulosis   . Erectile dysfunction   . Obstructive sleep apnea     noncompliant with CPAP  . Atrial flutter     a. s/p RFCA 8/13  . CKD (chronic kidney disease), stage III     hx of a/c renal failure during episode of diverticulitis 9/13 in [place], [place]  . Anemia     PAST SURGICAL HISTORY: Past Surgical History  Procedure Laterality Date  . Wrist sx  1980    right  . Knee sx  1985    both  . Benign stomach tumor removal  2000  . Thyroidectomy  2009    cysts removal   . Laparoscopic retropubic prostatectomy  02/2007    hx prostate cancer  . Kidney stone removal  multiple  . Tee without cardioversion  08/25/2011    Procedure: TRANSESOPHAGEAL ECHOCARDIOGRAM (TEE);  Surgeon: Jettie Booze, MD;  Location: [facility];  Service: Cardiovascular;  Laterality: N/A;  . Cardioversion  08/25/2011    Procedure: CARDIOVERSION;  Surgeon: Jettie Booze, MD;  Location: [facility];  Service: Cardiovascular;  Laterality: N/A;  . Tee without cardioversion  11/09/2011    Procedure: TRANSESOPHAGEAL ECHOCARDIOGRAM (TEE);  Surgeon: Jettie Booze, MD;  Location: [facility] ENDOSCOPY;  Service: Cardiovascular;  Laterality: N/A;  h/p in file drawer  . Atrial fibrillation ablation  12/06/11;  02/05/2013    Afib and atrial flutter ablation by Dr Rayann Heman; repeat PVI by Dr Rayann Heman 02/05/2013  . Tee without cardioversion N/A 02/05/2013    Procedure: TRANSESOPHAGEAL ECHOCARDIOGRAM (TEE);  Surgeon: Candee Furbish, MD;  Location: [facility] ENDOSCOPY;  Service: Cardiovascular;  Laterality: N/A;  . Cardioversion N/A 03/05/2013    Procedure: CARDIOVERSION;  Surgeon: Sinclair Grooms, MD;  Location: [facility];  Service: Cardiovascular;  Laterality: N/A;  BEDSIDE   . Esophagogastroduodenoscopy N/A 04/09/2013    Procedure: ESOPHAGOGASTRODUODENOSCOPY (EGD) with possible Balloon Dilation;  Surgeon: Garlan Fair, MD;  Location: [facility] ENDOSCOPY;  Service: Endoscopy;  Laterality: N/A;  . Atrial fibrillation ablation N/A 12/06/2011    Procedure: ATRIAL FIBRILLATION ABLATION;  Surgeon: Thompson Grayer, MD;  Location: [facility] CATH LAB;  Service: Cardiovascular;  Laterality: N/A;  . Atrial fibrillation ablation N/A 02/05/2013    Procedure: ATRIAL FIBRILLATION ABLATION;  Surgeon: Coralyn Mark, MD;  Location: [facility] CATH LAB;  Service: Cardiovascular;  Laterality: N/A;    MEDICATIONS AT HOME: Prior to Admission medications   Medication Sig Start Date End Date Taking? Authorizing Provider  albuterol (PROVENTIL) (2.5 MG/3ML) 0.083% nebulizer solution Take 2.5 mg by nebulization every 4 (four) hours as needed for wheezing or shortness of breath.  05/22/13   Historical Provider, MD  amiodarone (PACERONE) 200 MG tablet TAKE 1 TABLET BY MOUTH ON MONDAY, WEDNESDAY, FRIDAY, SATURDAY, & SUNDAY AND TAKE ONE-HALF (1/2) TABLET BY MOUTH ON TUESDAY & THURSDAY 11/11/14   Jettie Booze, MD  Canagliflozin (INVOKANA) 300 MG TABS Take 1 tablet by mouth daily.    Historical Provider, MD  carvedilol (COREG) 25 MG tablet TAKE 1 TABLET BY MOUTH TWICE DAILY 05/05/14   Jettie Booze, MD  Cholecalciferol (VITAMIN D) 2000 UNITS CAPS Take 2,000 Units by mouth daily.    Historical Provider, MD  ferrous sulfate 325 (65 FE) MG tablet Take 1 tablet (325  mg total) by mouth 3 (three) times daily with meals. Patient taking differently: Take 325 mg by mouth daily with breakfast.  02/01/13   Thompson Grayer, MD  furosemide (LASIX) 40 MG tablet Take 0.5 tablets (20 mg total) by mouth daily. 03/28/14   Thompson Grayer, MD  GLUCOSAMINE PO Take 1,000 mg by mouth daily.     Historical Provider, MD  imipramine (TOFRANIL) 25 MG tablet Take 50 mg by mouth at bedtime.     Historical Provider, MD  levothyroxine (SYNTHROID, LEVOTHROID) 200 MCG tablet Take 200 mcg by mouth daily.    Historical Provider, MD  losartan (COZAAR) 100 MG tablet Take 1 tablet (100 mg total) by mouth daily. 07/09/14   Jettie Booze, MD  Multiple Vitamins-Minerals (MENS 50+ MULTI VITAMIN/MIN PO) Take 1 tablet by mouth daily.    Historical Provider, MD  Omega-3 Fatty Acids (FISH OIL) 1000 MG CAPS Take 1 capsule by mouth daily.     Historical Provider, MD  pantoprazole (PROTONIX) 40  MG tablet Take 40 mg by mouth daily.     Historical Provider, MD  polycarbophil (FIBERCON) 625 MG tablet Take 1,250 mg by mouth daily.    Historical Provider, MD  potassium chloride SA (K-DUR,KLOR-CON) 20 MEQ tablet Take 2 tablets (40 mEq total) by mouth daily. Patient taking differently: Take 40 mEq by mouth 2 (two) times daily.  07/09/14   Jettie Booze, MD  pravastatin (PRAVACHOL) 20 MG tablet Take 10 mg by mouth at bedtime.     Historical Provider, MD  XARELTO 15 MG TABS tablet TAKE 1 TABLET BY MOUTH ONE TIME DAILY 05/05/14   Jettie Booze, MD    FAMILY HISTORY: Family History  Problem Relation Age of Onset  . Emphysema Mother   . Lung cancer Father   . Diabetes Brother   . Colon cancer Neg Hx     SOCIAL HISTORY:  reports that he has never smoked. He does not have any smokeless tobacco history on file. He reports that he does not drink alcohol or use illicit drugs. Lives at: Home/ Mobility:  Independent  REVIEW OF SYSTEMS:  Constitutional:   No  weight loss, night sweats,  Fevers,  chills, fatigue.  HEENT:    No headaches, Dysphagia,Tooth/dental problems,Sore throat  Cardio-vascular: No chest pain,Orthopnea, PND,lower extremity edema, anasarca  GI:  No heartburn, indigestion, abdominal pain, nausea, vomiting, diarrhea, melena or hematochezia  Resp: No , cough, hemoptysis,plueritic chest pain.   Skin:  No rash or lesions.  GU:  No dysuria, change in color of urine, no urgency or frequency.  No flank pain.  Musculoskeletal: No joint pain or swelling.  No decreased range of motion.  No back pain.  Endocrine: No heat intolerance, no cold intolerance, no polyuria, no polydipsia  Psych: No change in mood or affect. No depression or anxiety.  No memory loss.   PHYSICAL EXAM: Blood pressure 114/68, pulse 99, temperature 98.8 F (37.1 C), temperature source Oral, resp. rate 25, SpO2 98 %.  General appearance :Awake, alert, not in any distress. Speech Clear. Not toxic Looking HEENT: Atraumatic and Normocephalic, pupils equally reactive to light and accomodation Neck: supple, no JVD. No cervical lymphadenopathy.  Chest:Good air entry bilaterally, no added sounds  CVS: S1 S2 irregular, no murmurs.  Abdomen: Bowel sounds present, Non tender and not distended with no gaurding, rigidity or rebound. Extremities: B/L Lower Ext shows no edema, both legs are warm to touch Neurology:  Non focal Skin:No Rash Wounds:N/A  LABS ON ADMISSION:   Recent Labs  11/22/14 1200 11/22/14 1432  NA 141  --   K 6.2* 3.5  CL 105  --   CO2 18*  --   GLUCOSE 102*  --   BUN 22*  --   CREATININE 1.78*  --   CALCIUM 9.7  --    No results for input(s): AST, ALT, ALKPHOS, BILITOT, PROT, ALBUMIN in the last 72 hours. No results for input(s): LIPASE, AMYLASE in the last 72 hours.  Recent Labs  11/22/14 1200  WBC 10.6*  HGB 19.2*  HCT 53.2*  MCV 90.8  PLT 228   No results for input(s): CKTOTAL, CKMB, CKMBINDEX, TROPONINI in the last 72 hours. No results for input(s):  DDIMER in the last 72 hours. Invalid input(s): POCBNP   RADIOLOGIC STUDIES ON ADMISSION: Dg Chest 2 View  11/22/2014   CLINICAL DATA:  64 year old male with cardiac arrhythmia. Chest pain radiating to the right arm. Initial encounter.  EXAM: CHEST  2 VIEW  COMPARISON:  03/04/2013 and earlier.  FINDINGS: Interval resolved pulmonary interstitial edema. Lung volumes remain normal. Normal cardiac size and mediastinal contours. No pneumothorax, pleural effusion or confluent pulmonary opacity. No acute osseous abnormality identified. Evidence of previous thyroidectomy.  IMPRESSION: No acute cardiopulmonary abnormality.   Electronically Signed   By: Genevie Ann M.D.   On: 11/22/2014 13:53    I have personally reviewed images of chest xray    EKG: Personally reviewed. Aflutter  ASSESSMENT AND PLAN: Present on Admission:  . Syncope: Suspect most likely related to orthostatic hypertension. Rate now well-controlled. Monitor in telemetry, hold antihypertensives and diuretics. Hydrate with IV fluids. Recheck orthostatic vital signs in am. Obtain echocardiogram.   . Atrial fibrillation with RVR: Required IV Cardizem in the emergency room. Heart rate now well-controlled in the 80s-however remains in atrial flutter/atrial fibrillation. Has a long-standing history of paroxysmal atrial fibrillation-has failed cardioversion/ablation/multaq-and is maintained on amiodarone. For now we will continue his [facility] consulted cardiology for further assistance. Continues Xarelto -given worsening renal function-Will ask pharmacy to adjust dosing.   . Orthostatic hypotension: Suspect secondary to use of antihypertensives/diuretics/poor oral intake. Hydrate with IV fluids, hold antihypertensives and diuretics-recheck in a.m.   Marland Kitchen Hypertension: Blood pressure soft-hold all diuretics and antihypertensives. Resume when able   . Hyperkalemia: Suspect secondary to hemolysis-repeat potassium within normal limits. We will  repeat electrolyte panel later this evening. Hold losartan and K supplementation for now  . Acute on chronic kidney disease stage III: ARF likely secondary to prerenal azotemia from diuretics/losartan. Hydrate and recheck electrolytes in a.m. Losartan will be on hold.   . Polycythemia: Suspect secondary to hemoconcentration-repeat CBC-on xarelto-if still persistently high may need therapeutic phlebotomy (but currently orthostatic)  Further plan will depend as patient's clinical course evolves and further radiologic and laboratory data become available. Patient will be monitored closely.  Above noted plan was discussed with patient/spouse face to face at bedside, they were in agreement.   CONSULTS: Cards  DVT Prophylaxis: Xarelto  Code Status: Full Code  Disposition Plan:  Discharge back home possibly in 1-2 days  Total time spent  55 minutes.Greater than 50% of this time was spent in counseling, explanation of diagnosis, planning of further management, and coordination of care.  [facility] Pager [phone]  If 7PM-7AM, please contact night-coverage [email or web address] Password [facility] 11/22/2014, 3:36 PM

## 2014-11-23 DIAGNOSIS — I4891 Unspecified atrial fibrillation: Secondary | ICD-10-CM | POA: Diagnosis not present

## 2014-11-23 DIAGNOSIS — I5032 Chronic diastolic (congestive) heart failure: Secondary | ICD-10-CM | POA: Diagnosis not present

## 2014-11-23 DIAGNOSIS — I4892 Unspecified atrial flutter: Secondary | ICD-10-CM | POA: Diagnosis not present

## 2014-11-23 DIAGNOSIS — N170 Acute kidney failure with tubular necrosis: Secondary | ICD-10-CM | POA: Diagnosis not present

## 2014-11-23 DIAGNOSIS — N183 Chronic kidney disease, stage 3 (moderate): Secondary | ICD-10-CM | POA: Diagnosis not present

## 2014-11-23 DIAGNOSIS — N179 Acute kidney failure, unspecified: Secondary | ICD-10-CM | POA: Diagnosis not present

## 2014-11-23 LAB — BASIC METABOLIC PANEL
ANION GAP: 8 (ref 5–15)
BUN: 18 mg/dL (ref 6–20)
CALCIUM: 8.4 mg/dL — AB (ref 8.9–10.3)
CHLORIDE: 111 mmol/L (ref 101–111)
CO2: 21 mmol/L — ABNORMAL LOW (ref 22–32)
CREATININE: 1.42 mg/dL — AB (ref 0.61–1.24)
GFR, EST AFRICAN AMERICAN: 59 mL/min — AB (ref >60)
GFR, EST NON AFRICAN AMERICAN: 51 mL/min — AB (ref >60)
Glucose, Bld: 149 mg/dL — ABNORMAL HIGH (ref 65–99)
Potassium: 3.4 mmol/L — ABNORMAL LOW (ref 3.5–5.1)
SODIUM: 140 mmol/L (ref 135–145)

## 2014-11-23 LAB — CBC
HCT: 46.1 % (ref 39.0–52.0)
Hemoglobin: 15.4 g/dL (ref 13.0–17.0)
MCH: 31.7 pg (ref 26.0–34.0)
MCHC: 33.4 g/dL (ref 30.0–36.0)
MCV: 94.9 fL (ref 78.0–100.0)
Platelets: 161 10*3/uL (ref 150–400)
RBC: 4.86 MIL/uL (ref 4.22–5.81)
RDW: 13.9 % (ref 11.5–15.5)
WBC: 7.5 10*3/uL (ref 4.0–10.5)

## 2014-11-23 LAB — TROPONIN I: Troponin I: 0.07 ng/mL — ABNORMAL HIGH (ref <0.031)

## 2014-11-23 MED ORDER — CARVEDILOL 25 MG PO TABS: 25.0000 mg | ORAL_TABLET | Freq: Two times a day (BID) | ORAL | Status: DC

## 2014-11-23 MED ORDER — RIVAROXABAN 20 MG PO TABS
20.0000 mg | ORAL_TABLET | Freq: Every day | ORAL | Status: DC
  Administered 2014-11-23: 20 mg via ORAL
  Filled 2014-11-23 (×2): qty 1

## 2014-11-23 MED ORDER — CARVEDILOL 25 MG PO TABS
25.0000 mg | ORAL_TABLET | Freq: Two times a day (BID) | ORAL | Status: DC
  Administered 2014-11-23 – 2014-11-24 (×3): 25 mg via ORAL
  Filled 2014-11-23 (×6): qty 1

## 2014-11-23 MED ORDER — POTASSIUM CHLORIDE CRYS ER 20 MEQ PO TBCR
40.0000 meq | EXTENDED_RELEASE_TABLET | Freq: Once | ORAL | Status: AC
  Administered 2014-11-23: 40 meq via ORAL
  Filled 2014-11-23: qty 2

## 2014-11-23 MED ORDER — SODIUM CHLORIDE 0.9 % IJ SOLN
3.0000 mL | Freq: Two times a day (BID) | INTRAMUSCULAR | Status: DC
  Administered 2014-11-24: 3 mL via INTRAVENOUS

## 2014-11-23 MED ORDER — SODIUM CHLORIDE 0.9 % IJ SOLN: 3.0000 mL | INTRAMUSCULAR | Status: DC | PRN

## 2014-11-23 MED ORDER — SODIUM CHLORIDE 0.9 % IV SOLN: 250.0000 mL | INTRAVENOUS | Status: DC

## 2014-11-23 NOTE — Progress Notes (Signed)
SUBJECTIVE:  Very dizzy on standing.  Still feels rapid heart rate   PHYSICAL EXAM Filed Vitals:   11/22/14 1606 11/22/14 2114 11/23/14 0456 11/23/14 1019  BP: 131/79 124/69 111/64   Pulse: 68 84 95 114  Temp: 97.9 F (36.6 C) 98.4 F (36.9 C) 97.9 F (36.6 C)   TempSrc: Oral Oral Oral   Resp: 19 18 18    Height: 5\' 8"  (1.727 m)     Weight: 226 lb 13.7 oz (102.9 kg)     SpO2: 100% 96% 96%    General:  No distress Lungs:  Clear Heart:  Irregular Abdomen:  Positive bowel sounds, no rebound no guarding Extremities:  No edema   LABS: Lab Results  Component Value Date   TROPONINI 0.07* 11/23/2014   Results for orders placed or performed during the hospital encounter of 11/22/14 (from the past 24 hour(s))  Basic metabolic panel     Status: Abnormal   Collection Time: 11/22/14 12:00 PM  Result Value Ref Range   Sodium 141 135 - 145 mmol/L   Potassium 6.2 (HH) 3.5 - 5.1 mmol/L   Chloride 105 101 - 111 mmol/L   CO2 18 (L) 22 - 32 mmol/L   Glucose, Bld 102 (H) 65 - 99 mg/dL   BUN 22 (H) 6 - 20 mg/dL   Creatinine, Ser 1.78 (H) 0.61 - 1.24 mg/dL   Calcium 9.7 8.9 - 10.3 mg/dL   GFR calc non Af Amer 39 (L) >60 mL/min   GFR calc Af Amer 45 (L) >60 mL/min   Anion gap 18 (H) 5 - 15  CBC     Status: Abnormal   Collection Time: 11/22/14 12:00 PM  Result Value Ref Range   WBC 10.6 (H) 4.0 - 10.5 K/uL   RBC 5.86 (H) 4.22 - 5.81 MIL/uL   Hemoglobin 19.2 (H) 13.0 - 17.0 g/dL   HCT 53.2 (H) 39.0 - 52.0 %   MCV 90.8 78.0 - 100.0 fL   MCH 32.8 26.0 - 34.0 pg   MCHC 36.1 (H) 30.0 - 36.0 g/dL   RDW 13.6 11.5 - 15.5 %   Platelets 228 150 - 400 K/uL  I-Stat Troponin, ED (not at [facility])     Status: None   Collection Time: 11/22/14 12:19 PM  Result Value Ref Range   Troponin i, poc 0.00 0.00 - 0.08 ng/mL   Comment 3          Urinalysis, Routine w reflex microscopic (not at [facility])     Status: Abnormal   Collection Time: 11/22/14  1:37 PM  Result Value Ref Range   Color, Urine YELLOW  YELLOW   APPearance CLEAR CLEAR   Specific Gravity, Urine 1.024 1.005 - 1.030   pH 6.0 5.0 - 8.0   Glucose, UA >1000 (A) NEGATIVE mg/dL   Hgb urine dipstick NEGATIVE NEGATIVE   Bilirubin Urine NEGATIVE NEGATIVE   Ketones, ur 15 (A) NEGATIVE mg/dL   Protein, ur NEGATIVE NEGATIVE mg/dL   Urobilinogen, UA 1.0 0.0 - 1.0 mg/dL   Nitrite NEGATIVE NEGATIVE   Leukocytes, UA NEGATIVE NEGATIVE  Urine microscopic-add on     Status: Abnormal   Collection Time: 11/22/14  1:37 PM  Result Value Ref Range   Squamous Epithelial / LPF RARE RARE   WBC, UA 0-2 <3 WBC/hpf   RBC / HPF 0-2 <3 RBC/hpf   Bacteria, UA FEW (A) RARE   Casts HYALINE CASTS (A) NEGATIVE  Potassium     Status: None  Collection Time: 11/22/14  2:32 PM  Result Value Ref Range   Potassium 3.5 3.5 - 5.1 mmol/L  Basic metabolic panel     Status: Abnormal   Collection Time: 11/22/14  5:20 PM  Result Value Ref Range   Sodium 142 135 - 145 mmol/L   Potassium 3.8 3.5 - 5.1 mmol/L   Chloride 112 (H) 101 - 111 mmol/L   CO2 22 22 - 32 mmol/L   Glucose, Bld 132 (H) 65 - 99 mg/dL   BUN 21 (H) 6 - 20 mg/dL   Creatinine, Ser 1.58 (H) 0.61 - 1.24 mg/dL   Calcium 8.6 (L) 8.9 - 10.3 mg/dL   GFR calc non Af Amer 45 (L) >60 mL/min   GFR calc Af Amer 52 (L) >60 mL/min   Anion gap 8 5 - 15  Troponin I     Status: Abnormal   Collection Time: 11/22/14  5:20 PM  Result Value Ref Range   Troponin I 0.05 (H) <0.031 ng/mL  CBC     Status: None   Collection Time: 11/22/14  5:25 PM  Result Value Ref Range   WBC 9.2 4.0 - 10.5 K/uL   RBC 5.08 4.22 - 5.81 MIL/uL   Hemoglobin 16.2 13.0 - 17.0 g/dL   HCT 47.7 39.0 - 52.0 %   MCV 93.9 78.0 - 100.0 fL   MCH 31.9 26.0 - 34.0 pg   MCHC 34.0 30.0 - 36.0 g/dL   RDW 13.6 11.5 - 15.5 %   Platelets 170 150 - 400 K/uL  Troponin I     Status: Abnormal   Collection Time: 11/22/14  9:57 PM  Result Value Ref Range   Troponin I 0.07 (H) <0.031 ng/mL  Troponin I     Status: Abnormal   Collection Time:  11/23/14  4:19 AM  Result Value Ref Range   Troponin I 0.07 (H) <0.031 ng/mL  Basic metabolic panel     Status: Abnormal   Collection Time: 11/23/14  4:19 AM  Result Value Ref Range   Sodium 140 135 - 145 mmol/L   Potassium 3.4 (L) 3.5 - 5.1 mmol/L   Chloride 111 101 - 111 mmol/L   CO2 21 (L) 22 - 32 mmol/L   Glucose, Bld 149 (H) 65 - 99 mg/dL   BUN 18 6 - 20 mg/dL   Creatinine, Ser 1.42 (H) 0.61 - 1.24 mg/dL   Calcium 8.4 (L) 8.9 - 10.3 mg/dL   GFR calc non Af Amer 51 (L) >60 mL/min   GFR calc Af Amer 59 (L) >60 mL/min   Anion gap 8 5 - 15  CBC     Status: None   Collection Time: 11/23/14  4:19 AM  Result Value Ref Range   WBC 7.5 4.0 - 10.5 K/uL   RBC 4.86 4.22 - 5.81 MIL/uL   Hemoglobin 15.4 13.0 - 17.0 g/dL   HCT 46.1 39.0 - 52.0 %   MCV 94.9 78.0 - 100.0 fL   MCH 31.7 26.0 - 34.0 pg   MCHC 33.4 30.0 - 36.0 g/dL   RDW 13.9 11.5 - 15.5 %   Platelets 161 150 - 400 K/uL    Intake/Output Summary (Last 24 hours) at 11/23/14 1103 Last data filed at 11/23/14 1000  Gross per 24 hour  Intake   2100 ml  Output   2500 ml  Net   -400 ml    ASSESSMENT AND PLAN:  ATRIAL FIB:  Still with episodes of rapid rate.  I  left a message for Dr. Rayann Heman to discuss definitive therapy.  I will plan DCCV in the AM since he continues to have symptomatic rapid rate. Coreg was not given last night.  Start today.    SYNCOPE:   Orthostatic.  No further work up planned.    ELEVATED TROPONIN:  I suspect that this is related to his afib with RVR.  However, he has not had stress testing since 2012.  I would suggest getting him into sinus first and then considering stress perfusion imaging electively.     Jeneen Rinks [facility] 11/23/2014 11:03 AM

## 2014-11-23 NOTE — Discharge Instructions (Addendum)
Hold Cozaar and Lasix till see in the clinic 8/19        Information on my medicine - XARELTO (Rivaroxaban)  This medication education was reviewed with me or my healthcare representative as part of my discharge preparation.    Why was Xarelto prescribed for you? Xarelto was prescribed for you to reduce the risk of a blood clot forming that can cause a stroke if you have a medical condition called atrial fibrillation (a type of irregular heartbeat).  What do you need to know about xarelto ? Take your Xarelto ONCE DAILY at the same time every day with your evening meal. If you have difficulty swallowing the tablet whole, you may crush it and mix in applesauce just prior to taking your dose.  Take Xarelto exactly as prescribed by your doctor and DO NOT stop taking Xarelto without talking to the doctor who prescribed the medication.  Stopping without other stroke prevention medication to take the place of Xarelto may increase your risk of developing a clot that causes a stroke.  Refill your prescription before you run out.  After discharge, you should have regular check-up appointments with your healthcare provider that is prescribing your Xarelto.  In the future your dose may need to be changed if your kidney function or weight changes by a significant amount.  What do you do if you miss a dose? If you are taking Xarelto ONCE DAILY and you miss a dose, take it as soon as you remember on the same day then continue your regularly scheduled once daily regimen the next day. Do not take two doses of Xarelto at the same time or on the same day.   Important Safety Information A possible side effect of Xarelto is bleeding. You should call your healthcare provider right away if you experience any of the following: ? Bleeding from an injury or your nose that does not stop. ? Unusual colored urine (red or dark brown) or unusual colored stools (red or black). ? Unusual bruising for unknown  reasons. ? A serious fall or if you hit your head (even if there is no bleeding).  Some medicines may interact with Xarelto and might increase your risk of bleeding while on Xarelto. To help avoid this, consult your healthcare provider or pharmacist prior to using any new prescription or non-prescription medications, including herbals, vitamins, non-steroidal anti-inflammatory drugs (NSAIDs) and supplements.  This website has more information on Xarelto: [email or web address]  Atrial Fibrillation Atrial fibrillation is a type of irregular heart rhythm (arrhythmia). During atrial fibrillation, the upper chambers of the heart (atria) quiver continuously in a chaotic pattern. This causes an irregular and often rapid heart rate.  Atrial fibrillation is the result of the heart becoming overloaded with disorganized signals that tell it to beat. These signals are normally released one at a time by a part of the right atrium called the sinoatrial node. They then travel from the atria to the lower chambers of the heart (ventricles), causing the atria and ventricles to contract and pump blood as they pass. In atrial fibrillation, parts of the atria outside of the sinoatrial node also release these signals. This results in two problems. First, the atria receive so many signals that they do not have time to fully contract. Second, the ventricles, which can only receive one signal at a time, beat irregularly and out of rhythm with the atria.  There are three types of atrial fibrillation:   Paroxysmal. Paroxysmal atrial fibrillation starts suddenly and stops  on its own within a week.  Persistent. Persistent atrial fibrillation lasts for more than a week. It may stop on its own or with treatment.  Permanent. Permanent atrial fibrillation does not go away. Episodes of atrial fibrillation may lead to permanent atrial fibrillation. Atrial fibrillation can prevent your heart from pumping blood normally. It increases  your risk of stroke and can lead to heart failure.  CAUSES   Heart conditions, including a heart attack, heart failure, coronary artery disease, and heart valve conditions.   Inflammation of the sac that surrounds the heart (pericarditis).  Blockage of an artery in the lungs (pulmonary embolism).  Pneumonia or other infections.  Chronic lung disease.  Thyroid problems, especially if the thyroid is overactive (hyperthyroidism).  Caffeine, excessive alcohol use, and use of some illegal drugs.   Use of some medicines, including certain decongestants and diet pills.  Heart surgery.   Birth defects.  Sometimes, no cause can be found. When this happens, the atrial fibrillation is called lone atrial fibrillation. The risk of complications from atrial fibrillation increases if you have lone atrial fibrillation and you are age 8 years or older. RISK FACTORS  Heart failure.  Coronary artery disease.  Diabetes mellitus.   High blood pressure (hypertension).   Obesity.   Other arrhythmias.   Increased age. SIGNS AND SYMPTOMS   A feeling that your heart is beating rapidly or irregularly.   A feeling of discomfort or pain in your chest.   Shortness of breath.   Sudden light-headedness or weakness.   Getting tired easily when exercising.   Urinating more often than normal (mainly when atrial fibrillation first begins).  In paroxysmal atrial fibrillation, symptoms may start and suddenly stop. DIAGNOSIS  Your health care provider may be able to detect atrial fibrillation when taking your pulse. Your health care provider may have you take a test called an ambulatory electrocardiogram (ECG). An ECG records your heartbeat patterns over a 24-hour period. You may also have other tests, such as:  Transthoracic echocardiogram (TTE). During echocardiography, sound waves are used to evaluate how blood flows through your heart.  Transesophageal echocardiogram  (TEE).  Stress test. There is more than one type of stress test. If a stress test is needed, ask your health care provider about which type is best for you.  Chest X-ray exam.  Blood tests.  Computed tomography (CT). TREATMENT  Treatment may include:  Treating any underlying conditions. For example, if you have an overactive thyroid, treating the condition may correct atrial fibrillation.  Taking medicine. Medicines may be given to control a rapid heart rate or to prevent blood clots, heart failure, or a stroke.  Having a procedure to correct the rhythm of the heart:  Electrical cardioversion. During electrical cardioversion, a controlled, low-energy shock is delivered to the heart through your skin. If you have chest pain, very low blood pressure, or sudden heart failure, this procedure may need to be done as an emergency.  Catheter ablation. During this procedure, heart tissues that send the signals that cause atrial fibrillation are destroyed.  Surgical ablation. During this surgery, thin lines of heart tissue that carry the abnormal signals are destroyed. This procedure can either be an open-heart surgery or a minimally invasive surgery. With the minimally invasive surgery, small cuts are made to access the heart instead of a large opening.  Pulmonary venous isolation. During this surgery, tissue around the veins that carry blood from the lungs (pulmonary veins) is destroyed. This tissue is thought  to carry the abnormal signals. HOME CARE INSTRUCTIONS   Take medicines only as directed by your health care provider. Some medicines can make atrial fibrillation worse or recur.  If blood thinners were prescribed by your health care provider, take them exactly as directed. Too much blood-thinning medicine can cause bleeding. If you take too little, you will not have the needed protection against stroke and other problems.  Perform blood tests at home if directed by your health care  provider. Perform blood tests exactly as directed.  Quit smoking if you smoke.  Do not drink alcohol.  Do not drink caffeinated beverages such as coffee, soda, and some teas. You may drink decaffeinated coffee, soda, or tea.   Maintain a healthy weight.Do not use diet pills unless your health care provider approves. They may make heart problems worse.   Follow diet instructions as directed by your health care provider.  Exercise regularly as directed by your health care provider.  Keep all follow-up visits as directed by your health care provider. This is important. PREVENTION  The following substances can cause atrial fibrillation to recur:   Caffeinated beverages.  Alcohol.  Certain medicines, especially those used for breathing problems.  Certain herbs and herbal medicines, such as those containing ephedra or ginseng.  Illegal drugs, such as cocaine and amphetamines. Sometimes medicines are given to prevent atrial fibrillation from recurring. Proper treatment of any underlying condition is also important in helping prevent recurrence.  SEEK MEDICAL CARE IF:  You notice a change in the rate, rhythm, or strength of your heartbeat.  You suddenly begin urinating more frequently.  You tire more easily when exerting yourself or exercising. SEEK IMMEDIATE MEDICAL CARE IF:   You have chest pain, abdominal pain, sweating, or weakness.  You feel nauseous.  You have shortness of breath.  You suddenly have swollen feet and ankles.  You feel dizzy.  Your face or limbs feel numb or weak.  You have a change in your vision or speech. MAKE SURE YOU:   Understand these instructions.  Will watch your condition.  Will get help right away if you are not doing well or get worse. Document Released: 03/28/2005 Document Revised: 08/12/2013 Document Reviewed: 05/08/2012 [facility] Patient Information 2015 [place], [place]. This information is not intended to replace advice given to  you by your health care provider. Make sure you discuss any questions you have with your health care provider.

## 2014-11-23 NOTE — Progress Notes (Signed)
MD paged regading pt feeling SOB. Placed on 2L O2. Sats at 100%. No new orders given. Will continue to monitor closely.  Raliegh Ip RN

## 2014-11-23 NOTE — Progress Notes (Signed)
PT Cancellation Note  Patient Details Name: [name] [name] MRN: [id number] DOB: 29-May-1950   Cancelled Treatment:    Reason Eval/Treat Not Completed: Medical issues which prohibited therapy.  Patient's HR fluctuating 116-134 at rest.  Per RN, patient very symptomatic with SOB and dizziness.  Will return tomorrow to attempt PT evaluation if appropriate.  Possible cardioversion tomorrow per chart.   Despina Pole 11/23/2014, 10:44 AM Carita Pian. Sanjuana Kava, [place] Pager [phone]

## 2014-11-23 NOTE — Progress Notes (Signed)
Pt wife voiced concerns for husbands BP. RN took manual BP and got 132/96. HR sustaining 130s and then will return to 110s. Pt still symptomatic feeling weak and palpitations despite decrease in HR. Pt also SOB and appears making great effort to breath. RN applied 2L [place]. O2 sats  Today pt received 200 mg amiodarone and coreg 25mg  BID.   Notified Rogue Bussing with [facility] and he reported to RN to notify cardiology.   2300 spoke with Dr. Wynonia Lawman and updated him. Gave orders to check EKG. EKG obtained and no new arrhthymias. Pt was able to calm breathing and go to sleep.   Will continue to monitor.   Earlie Lou

## 2014-11-23 NOTE — Care Management Note (Addendum)
Case Management Note  Patient Details  Name: [name] [name] MRN: [id number] Date of Birth: 1950-05-23  Subjective/Objective:                  Palpitations, dizziness on standing, right arm pain; Pt with Paroxysmal Afib scheduled for Cardioversion.    Action/Plan: Cm spoke to patient and family at the bedside. Pt and spouse state that the pt was independent prior to admission. Pt denies any needs for discharge and has already been on Xarelto prior to admission. CM will continue to monitor for discharge planning needs. PT eval pending.    Expected Discharge Date:  11/24/14               Expected Discharge Plan:  Home/Self Care  In-House Referral:     Discharge planning Services  CM Consult  Post Acute Care Choice:    Choice offered to:     DME Arranged:    DME Agency:     HH Arranged:    HH Agency:     Status of Service:  In process, will continue to follow.   Medicare Important Message Given:    Date Medicare IM Given:    Medicare IM give by:    Date Additional Medicare IM Given:    Additional Medicare Important Message give by:     If discussed at [place] of Stay Meetings, dates discussed:    Additional Comments:  Guido Sander, RN 11/23/2014, 5:13 PM

## 2014-11-23 NOTE — Progress Notes (Signed)
ANTICOAGULATION CONSULT NOTE - Initial Consult  Pharmacy Consult for rivaroxaban Indication: atrial fibrillation  Allergies  Allergen Reactions  . Phenergan [Promethazine Hcl] Itching    Hallucination   . Aspirin     Hx of ulcer   . Bystolic [Nebivolol Hcl] Swelling    Bradycardia   . Calcium Channel Blockers Swelling    LE edema  . Prednisone     Hx of ulcer  . Nsaids     Hx ulcer    Patient Measurements: Height: 5\' 8"  (172.7 cm) Weight: 226 lb 13.7 oz (102.9 kg) IBW/kg (Calculated) : 68.4  Vital Signs: Temp: 97.9 F (36.6 C) (08/14 0456) Temp Source: Oral (08/14 0456) BP: 111/64 mmHg (08/14 0456) Pulse Rate: 114 (08/14 1019)  Labs:  Recent Labs  11/22/14 1200 11/22/14 1720 11/22/14 1725 11/22/14 2157 11/23/14 0419  HGB 19.2*  --  16.2  --  15.4  HCT 53.2*  --  47.7  --  46.1  PLT 228  --  170  --  161  CREATININE 1.78* 1.58*  --   --  1.42*  TROPONINI  --  0.05*  --  0.07* 0.07*    Estimated Creatinine Clearance: 61.1 mL/min (by C-G formula based on Cr of 1.42).   Medical History: Past Medical History  Diagnosis Date  . Hypertension   . PAF (paroxysmal atrial fibrillation)     a. failed DCCV and multiple anti-arrhythmic drugs (multaq/flecainide);   b. s/p  PVI isolation with ablation of AFib 8/13; c. repeat PVI 01/2013;  d. 02/2013 repeat DCCV->Amio load/xarelto.  . Renal calculi   . Chronic combined systolic and diastolic CHF (congestive heart failure)     a. LVEF previously 35% felt to be due tachycardia;  b. 02/2013 Echo: EF 50-55%, no rwma, mildly dil LA/RA, mild to mod MR.  . Diabetes mellitus   . Obesities, morbid   . Hypothyroid   . Gastric ulcer     s/p prior surgery  . Diverticulosis   . Erectile dysfunction   . Obstructive sleep apnea     noncompliant with CPAP  . Atrial flutter     a. s/p RFCA 8/13  . CKD (chronic kidney disease), stage III     hx of a/c renal failure during episode of diverticulitis 9/13 in [place], [place]  .  Anemia     Assessment: 64 yo male presenting with a fib  PMH: a flutter and persistent a fib (hx of ablation and cardioversion) on xarelto, CKD III, HTN, CHF, DM, hypothyroid  AC: on xarelto 15 mg daily PTA for a fib   Renal: SCr 1.42 (unsure of baseline, was 1.5 11/19/14 and 1.19 on 07/23/13). CrCl ~ 60 ml/min   Heme: CBC WNL   Plan:  Increase rivaroxaban to 20 mg daily  Monitor renal function  Levester Fresh, PharmD, [facility] Clinical Pharmacist Pager [phone] 11/23/2014 10:38 AM

## 2014-11-23 NOTE — Progress Notes (Signed)
[facility] PROGRESS NOTE  FRANCISCA HARBUCK CHE:[id number] DOB: [birth year] DOA: 11/22/2014 PCP: Donnajean Lopes, MD  Assessment/Plan: Afib with RVR -HR uncontrolled this am, esp with activity going up to 130-140 -now on 200mg  of Amiodarone, previously on 200 alternating with 100mg  daily -Resume Coreg, did not get this last night, suspect some rebound effect from that too -has been difficult to Rx ( previous ablations, antiarrhythmics etc) -Cards following -Xarelto for anticoagulation  Syncope -due to Orthostatic hypotension/dehydration/Afib RVR -cut down IVF rate, lungs clear today -FU ECHO -repeat Orthostatics pending  Elevated troponin -mild, non ACS pattern, suspect demand due to Afib/RVR -resume Coreg, FU ECHO  Hypertension:  -improved, hold diuretics, resume Coreg   Hyperkalemia: -resolved, Kcl and ARB held  Acute on chronic kidney disease stage III:  -ARF likely secondary to prerenal azotemia from diuretics/losartan -improving, stop ARB -cut down IVF, Bmet in am   . Polycythemia:  -Secondary to hemoconcentration/dehydration -resolved with hydration  Code Status: Full Code Family Communication: wife at bedside Disposition Plan: home when stable   Consultants:  Cardiology  HPI/Subjective: HR went up to 130s-140s with minimal activity  Objective: Filed Vitals:   11/23/14 0456  BP: 111/64  Pulse: 95  Temp: 97.9 F (36.6 C)  Resp: 18    Intake/Output Summary (Last 24 hours) at 11/23/14 0927 Last data filed at 11/23/14 5361  Gross per 24 hour  Intake   1740 ml  Output   1600 ml  Net    140 ml   Filed Weights   11/22/14 1606  Weight: 102.9 kg (226 lb 13.7 oz)    Exam:   General:  AAOx3  Cardiovascular: S1S2/Irregular rate and rhythm  Respiratory: CTAB  Abdomen: soft, NT, BS present  Musculoskeletal: no edema c/c   Data Reviewed: Basic Metabolic Panel:  Recent Labs Lab 11/19/14 1159 11/22/14 1200 11/22/14 1432  11/22/14 1720 11/23/14 0419  NA 139 141  --  142 140  K 3.9 6.2* 3.5 3.8 3.4*  CL 107 105  --  112* 111  CO2  --  18*  --  22 21*  GLUCOSE 111* 102*  --  132* 149*  BUN 27* 22*  --  21* 18  CREATININE 1.50* 1.78*  --  1.58* 1.42*  CALCIUM  --  9.7  --  8.6* 8.4*   Liver Function Tests: No results for input(s): AST, ALT, ALKPHOS, BILITOT, PROT, ALBUMIN in the last 168 hours. No results for input(s): LIPASE, AMYLASE in the last 168 hours. No results for input(s): AMMONIA in the last 168 hours. CBC:  Recent Labs Lab 11/19/14 1159 11/19/14 1203 11/22/14 1200 11/22/14 1725 11/23/14 0419  WBC  --  7.6 10.6* 9.2 7.5  NEUTROABS  --  4.4  --   --   --   HGB 15.6 15.4 19.2* 16.2 15.4  HCT 46.0 44.8 53.2* 47.7 46.1  MCV  --  93.5 90.8 93.9 94.9  PLT  --  168 228 170 161   Cardiac Enzymes:  Recent Labs Lab 11/22/14 1720 11/22/14 2157 11/23/14 0419  TROPONINI 0.05* 0.07* 0.07*   BNP (last 3 results) No results for input(s): BNP in the last 8760 hours.  ProBNP (last 3 results) No results for input(s): PROBNP in the last 8760 hours.  CBG: No results for input(s): GLUCAP in the last 168 hours.  No results found for this or any previous visit (from the past 240 hour(s)).   Studies: Dg Chest 2 View  11/22/2014   CLINICAL DATA:  64 year old male with cardiac arrhythmia. Chest pain radiating to the right arm. Initial encounter.  EXAM: CHEST  2 VIEW  COMPARISON:  03/04/2013 and earlier.  FINDINGS: Interval resolved pulmonary interstitial edema. Lung volumes remain normal. Normal cardiac size and mediastinal contours. No pneumothorax, pleural effusion or confluent pulmonary opacity. No acute osseous abnormality identified. Evidence of previous thyroidectomy.  IMPRESSION: No acute cardiopulmonary abnormality.   Electronically Signed   By: Genevie Ann M.D.   On: 11/22/2014 13:53    Scheduled Meds: . amiodarone  200 mg Oral Daily  . carvedilol  25 mg Oral BID WC  . levothyroxine  200  mcg Oral QAC breakfast  . pantoprazole  40 mg Oral Daily  . potassium chloride  40 mEq Oral Once  . pravastatin  10 mg Oral QHS  . Rivaroxaban  15 mg Oral Q supper  . sodium chloride  3 mL Intravenous Q12H   Continuous Infusions: . sodium chloride 100 mL/hr at 11/23/14 0315   Antibiotics Given (last 72 hours)    None      Principal Problem:   Atrial fibrillation with RVR Active Problems:   Hypertension   Obesity   Syncope   Orthostatic hypotension   Hyperkalemia   ARF (acute renal failure)   Polycythemia   Atrial flutter with rapid ventricular response    Time spent: 8min    Oriana Horiuchi  [facility] Pager [phone]. If 7PM-7AM, please contact night-coverage at [email or web address], password [facility] 11/23/2014, 9:27 AM  LOS: 1 day

## 2014-11-24 ENCOUNTER — Inpatient Hospital Stay (HOSPITAL_COMMUNITY): Payer: Managed Care, Other (non HMO)

## 2014-11-24 ENCOUNTER — Encounter (HOSPITAL_COMMUNITY)
Admission: EM | Disposition: A | Discharge status: Home / Self Care | Payer: Self-pay | Source: Home / Self Care | Attending: Emergency Medicine

## 2014-11-24 ENCOUNTER — Encounter (HOSPITAL_COMMUNITY): Payer: Self-pay | Admitting: Cardiology

## 2014-11-24 DIAGNOSIS — N179 Acute kidney failure, unspecified: Secondary | ICD-10-CM | POA: Insufficient documentation

## 2014-11-24 DIAGNOSIS — I4891 Unspecified atrial fibrillation: Secondary | ICD-10-CM

## 2014-11-24 DIAGNOSIS — N183 Chronic kidney disease, stage 3 unspecified: Secondary | ICD-10-CM | POA: Diagnosis present

## 2014-11-24 DIAGNOSIS — Z8679 Personal history of other diseases of the circulatory system: Secondary | ICD-10-CM

## 2014-11-24 DIAGNOSIS — Z7901 Long term (current) use of anticoagulants: Secondary | ICD-10-CM

## 2014-11-24 DIAGNOSIS — E785 Hyperlipidemia, unspecified: Secondary | ICD-10-CM | POA: Diagnosis present

## 2014-11-24 DIAGNOSIS — Z9889 Other specified postprocedural states: Secondary | ICD-10-CM | POA: Insufficient documentation

## 2014-11-24 DIAGNOSIS — E1122 Type 2 diabetes mellitus with diabetic chronic kidney disease: Secondary | ICD-10-CM | POA: Diagnosis present

## 2014-11-24 DIAGNOSIS — I5032 Chronic diastolic (congestive) heart failure: Secondary | ICD-10-CM | POA: Diagnosis not present

## 2014-11-24 DIAGNOSIS — E039 Hypothyroidism, unspecified: Secondary | ICD-10-CM | POA: Diagnosis present

## 2014-11-24 DIAGNOSIS — I4892 Unspecified atrial flutter: Secondary | ICD-10-CM

## 2014-11-24 HISTORY — PX: ELECTROPHYSIOLOGIC STUDY: SHX172A

## 2014-11-24 LAB — BASIC METABOLIC PANEL
ANION GAP: 8 (ref 5–15)
BUN: 11 mg/dL (ref 6–20)
CHLORIDE: 108 mmol/L (ref 101–111)
CO2: 23 mmol/L (ref 22–32)
Calcium: 8.4 mg/dL — ABNORMAL LOW (ref 8.9–10.3)
Creatinine, Ser: 1.22 mg/dL (ref 0.61–1.24)
GFR calc non Af Amer: >60 mL/min (ref >60)
Glucose, Bld: 135 mg/dL — ABNORMAL HIGH (ref 65–99)
Potassium: 3.2 mmol/L — ABNORMAL LOW (ref 3.5–5.1)
Sodium: 139 mmol/L (ref 135–145)

## 2014-11-24 LAB — CBC
HCT: 46.6 % (ref 39.0–52.0)
HEMOGLOBIN: 15.9 g/dL (ref 13.0–17.0)
MCH: 31.8 pg (ref 26.0–34.0)
MCHC: 34.1 g/dL (ref 30.0–36.0)
MCV: 93.2 fL (ref 78.0–100.0)
Platelets: 161 10*3/uL (ref 150–400)
RBC: 5 MIL/uL (ref 4.22–5.81)
RDW: 13.7 % (ref 11.5–15.5)
WBC: 7.3 10*3/uL (ref 4.0–10.5)

## 2014-11-24 LAB — TSH: TSH: 0.503 u[IU]/mL (ref 0.350–4.500)

## 2014-11-24 SURGERY — CARDIOVERSION (CATH LAB)

## 2014-11-24 MED ORDER — POTASSIUM CHLORIDE CRYS ER 20 MEQ PO TBCR
40.0000 meq | EXTENDED_RELEASE_TABLET | Freq: Once | ORAL | Status: AC
  Administered 2014-11-24: 40 meq via ORAL
  Filled 2014-11-24: qty 2

## 2014-11-24 MED ORDER — FENTANYL CITRATE (PF) 100 MCG/2ML IJ SOLN
INTRAMUSCULAR | Status: DC | PRN
  Administered 2014-11-24: 25 ug via INTRAVENOUS
  Administered 2014-11-24: 50 ug via INTRAVENOUS

## 2014-11-24 MED ORDER — PERFLUTREN LIPID MICROSPHERE
1.0000 mL | INTRAVENOUS | Status: AC | PRN
  Administered 2014-11-24: 2 mL via INTRAVENOUS
  Filled 2014-11-24: qty 10

## 2014-11-24 MED ORDER — FENTANYL CITRATE (PF) 100 MCG/2ML IJ SOLN
INTRAMUSCULAR | Status: AC
  Filled 2014-11-24: qty 4

## 2014-11-24 MED ORDER — AMIODARONE HCL 200 MG PO TABS: 200.0000 mg | ORAL_TABLET | Freq: Two times a day (BID) | ORAL | Status: DC

## 2014-11-24 MED ORDER — FERROUS SULFATE 325 (65 FE) MG PO TABS: 325.0000 mg | ORAL_TABLET | Freq: Every day | ORAL | Status: DC

## 2014-11-24 MED ORDER — RIVAROXABAN 15 MG PO TABS: 15.0000 mg | ORAL_TABLET | Freq: Every evening | ORAL | Status: DC

## 2014-11-24 MED ORDER — POTASSIUM CHLORIDE CRYS ER 20 MEQ PO TBCR: 40.0000 meq | EXTENDED_RELEASE_TABLET | Freq: Every day | ORAL | Status: DC

## 2014-11-24 MED ORDER — MIDAZOLAM HCL 5 MG/5ML IJ SOLN
INTRAMUSCULAR | Status: AC
  Filled 2014-11-24: qty 25

## 2014-11-24 MED ORDER — MIDAZOLAM HCL 5 MG/5ML IJ SOLN
INTRAMUSCULAR | Status: DC | PRN
Start: 2014-11-24 — End: 2014-11-24
  Administered 2014-11-24: 2 mg via INTRAVENOUS
  Administered 2014-11-24: 1 mg via INTRAVENOUS
  Administered 2014-11-24 (×2): 0.5 mg via INTRAVENOUS

## 2014-11-24 SURGICAL SUPPLY — 1 items: PAD DEFIB LIFELINK (PAD) ×3 IMPLANT

## 2014-11-24 NOTE — Progress Notes (Signed)
[facility] PROGRESS NOTE  JULIE PAOLINI PZW:[id number] DOB: 01/14/1951 DOA: 11/22/2014 PCP: Donnajean Lopes, MD  Assessment/Plan: Afib with RVR -HR still uncontrolled, esp with activity and symptomatic -on PO Amiodarone,  25mg  of Coreg -Cards following, possible DCCV today -has been difficult to Rx ( previous ablations, antiarrhythmics etc) -on Xarelto for anticoagulation  Syncope -due to Orthostatic hypotension/dehydration/Afib RVR -stop IVF rate, lungs clear today -ECHO done today, will FU -repeat Orthostatics still pending  Elevated troponin -mild, non ACS pattern, suspect demand due to Afib/RVR -continue  Coreg, will FU ECHO  Hypertension:  -improved, hold diuretics, resume Coreg   Hyperkalemia: -resolved, Kcl and ARB held  Acute on chronic kidney disease stage III:  -ARF likely secondary to prerenal azotemia from diuretics/losartan -resolved, stop ARB -stop IVF, bmet in am   . Polycythemia:  -Secondary to hemoconcentration/dehydration -resolved with hydration  Code Status: Full Code Family Communication: wife at bedside Disposition Plan: home when stable   Consultants:  Cardiology  HPI/Subjective: HR still going up with minimal activity  Objective: Filed Vitals:   11/24/14 1133  BP: 131/79  Pulse: 63  Temp:   Resp: 16    Intake/Output Summary (Last 24 hours) at 11/24/14 1149 Last data filed at 11/24/14 0843  Gross per 24 hour  Intake    639 ml  Output   2150 ml  Net  -1511 ml   Filed Weights   11/22/14 1606  Weight: 102.9 kg (226 lb 13.7 oz)    Exam:   General:  AAOx3  Cardiovascular: S1S2/Irregular rate and rhythm  Respiratory: CTAB  Abdomen: soft, NT, BS present  Musculoskeletal: no edema c/c   Data Reviewed: Basic Metabolic Panel:  Recent Labs Lab 11/19/14 1159 11/22/14 1200 11/22/14 1432 11/22/14 1720 11/23/14 0419 11/24/14 0430  NA 139 141  --  142 140 139  K 3.9 6.2* 3.5 3.8 3.4* 3.2*  CL 107 105   --  112* 111 108  CO2  --  18*  --  22 21* 23  GLUCOSE 111* 102*  --  132* 149* 135*  BUN 27* 22*  --  21* 18 11  CREATININE 1.50* 1.78*  --  1.58* 1.42* 1.22  CALCIUM  --  9.7  --  8.6* 8.4* 8.4*   Liver Function Tests: No results for input(s): AST, ALT, ALKPHOS, BILITOT, PROT, ALBUMIN in the last 168 hours. No results for input(s): LIPASE, AMYLASE in the last 168 hours. No results for input(s): AMMONIA in the last 168 hours. CBC:  Recent Labs Lab 11/19/14 1203 11/22/14 1200 11/22/14 1725 11/23/14 0419 11/24/14 0430  WBC 7.6 10.6* 9.2 7.5 7.3  NEUTROABS 4.4  --   --   --   --   HGB 15.4 19.2* 16.2 15.4 15.9  HCT 44.8 53.2* 47.7 46.1 46.6  MCV 93.5 90.8 93.9 94.9 93.2  PLT 168 228 170 161 161   Cardiac Enzymes:  Recent Labs Lab 11/22/14 1720 11/22/14 2157 11/23/14 0419  TROPONINI 0.05* 0.07* 0.07*   BNP (last 3 results) No results for input(s): BNP in the last 8760 hours.  ProBNP (last 3 results) No results for input(s): PROBNP in the last 8760 hours.  CBG: No results for input(s): GLUCAP in the last 168 hours.  No results found for this or any previous visit (from the past 240 hour(s)).   Studies: Dg Chest 2 View  11/22/2014   CLINICAL DATA:  64 year old male with cardiac arrhythmia. Chest pain radiating to the right arm. Initial encounter.  EXAM: CHEST  2 VIEW  COMPARISON:  03/04/2013 and earlier.  FINDINGS: Interval resolved pulmonary interstitial edema. Lung volumes remain normal. Normal cardiac size and mediastinal contours. No pneumothorax, pleural effusion or confluent pulmonary opacity. No acute osseous abnormality identified. Evidence of previous thyroidectomy.  IMPRESSION: No acute cardiopulmonary abnormality.   Electronically Signed   By: Genevie Ann M.D.   On: 11/22/2014 13:53    Scheduled Meds: . amiodarone  200 mg Oral Daily  . carvedilol  25 mg Oral BID WC  . levothyroxine  200 mcg Oral QAC breakfast  . pantoprazole  40 mg Oral Daily  . potassium  chloride  40 mEq Oral Once  . pravastatin  10 mg Oral QHS  . Rivaroxaban  20 mg Oral Q supper  . sodium chloride  3 mL Intravenous Q12H  . sodium chloride  3 mL Intravenous Q12H   Continuous Infusions: . sodium chloride Stopped (11/24/14 0811)  . sodium chloride     Antibiotics Given (last 72 hours)    None      Principal Problem:   Atrial flutter with rapid ventricular response Active Problems:   Obstructive sleep apnea-C-pap intol   Hypertension   Chronic diastolic heart failure   CKD (chronic kidney disease), stage III   Syncope   PAF- DCCV Nov 2014- Amiodarone   Orthostatic hypotension   S/P ablation of atrial flutter-2013 and Oct 2014   Dyslipidemia   Chronic anticoagulation-Xarelto   Type 2 diabetes mellitus with stage 3 chronic kidney disease   Hypothyroidism    Time spent: 60min    Renesmay Nesbitt  [facility] Pager [phone]. If 7PM-7AM, please contact night-coverage at [email or web address], password [facility] 11/24/2014, 11:49 AM  LOS: 2 days

## 2014-11-24 NOTE — Consult Note (Addendum)
Reason for Consult:   Atrial flutter with RVR  Requesting Physician: Dr Irish Lack Primary Cardiologist Dr Irish Lack Dr Rayann Heman  HPI:  64 year old male with a past medical history of persistent atrial fibrillation s/p PVI 11/2011 and then again 02/05/2013, hypertension, previous tachycardia mediated cardiomyopathy, diabetes, obesity, and hypothyroidism. He has failed medical therapy with Flecainide and Multaq. He did have good results with Amiodarone but this medication was discontinued due to concerns about long term usage. He had recurrent AF in Nov 2014 and was placed back on Amiodarone and DCCV to NSR. In Nov 2015. He presents now with recurrent symptomatic tachycardia which started over the weekend. EKG shows A flutter with 2:1 conduction.    PMHx:  Past Medical History  Diagnosis Date  . Hypertension   . PAF (paroxysmal atrial fibrillation)     a. failed DCCV and multiple anti-arrhythmic drugs (multaq/flecainide);   b. s/p  PVI isolation with ablation of AFib 8/13; c. repeat PVI 01/2013;  d. 02/2013 repeat DCCV->Amio load/xarelto.  . Renal calculi   . Chronic combined systolic and diastolic CHF (congestive heart failure)     a. LVEF previously 35% felt to be due tachycardia;  b. 02/2013 Echo: EF 50-55%, no rwma, mildly dil LA/RA, mild to mod MR.  . Diabetes mellitus   . Obesities, morbid   . Hypothyroid   . Gastric ulcer     s/p prior surgery  . Diverticulosis   . Erectile dysfunction   . Obstructive sleep apnea     noncompliant with CPAP  . Atrial flutter     a. s/p RFCA 8/13  . CKD (chronic kidney disease), stage III     hx of a/c renal failure during episode of diverticulitis 9/13 in [place], [place]  . Anemia     Past Surgical History  Procedure Laterality Date  . Wrist sx  1980    right  . Knee sx  1985    both  . Benign stomach tumor removal  2000  . Thyroidectomy  2009    cysts removal   . Laparoscopic retropubic prostatectomy  02/2007    hx  prostate cancer  . Kidney stone removal      multiple  . Tee without cardioversion  08/25/2011    Procedure: TRANSESOPHAGEAL ECHOCARDIOGRAM (TEE);  Surgeon: Jettie Booze, MD;  Location: [place];  Service: Cardiovascular;  Laterality: N/A;  . Cardioversion  08/25/2011    Procedure: CARDIOVERSION;  Surgeon: Jettie Booze, MD;  Location: [place];  Service: Cardiovascular;  Laterality: N/A;  . Tee without cardioversion  11/09/2011    Procedure: TRANSESOPHAGEAL ECHOCARDIOGRAM (TEE);  Surgeon: Jettie Booze, MD;  Location[facility] ENDOSCOPY;  Service: Cardiovascular;  Laterality: N/A;  h/p in file drawer  . Atrial fibrillation ablation  12/06/11; 02/05/2013    Afib and atrial flutter ablation by Dr Rayann Heman; repeat PVI by Dr Rayann Heman 02/05/2013  . Tee without cardioversion N/A 02/05/2013    Procedure: TRANSESOPHAGEAL ECHOCARDIOGRAM (TEE);  Surgeon: Candee Furbish, MD;  Location: [facility] ENDOSCOPY;  Service: Cardiovascular;  Laterality: N/A;  . Cardioversion N/A 03/05/2013    Procedure: CARDIOVERSION;  Surgeon: Sinclair Grooms, MD;  Location: [place];  Service: Cardiovascular;  Laterality: N/A;  BEDSIDE   . Esophagogastroduodenoscopy N/A 04/09/2013    Procedure: ESOPHAGOGASTRODUODENOSCOPY (EGD) with possible Balloon Dilation;  Surgeon: Garlan Fair, MD;  Location: [facility] ENDOSCOPY;  Service: Endoscopy;  Laterality: N/A;  . Atrial fibrillation ablation N/A 12/06/2011  Procedure: ATRIAL FIBRILLATION ABLATION;  Surgeon: Thompson Grayer, MD;  Location: [facility] CATH LAB;  Service: Cardiovascular;  Laterality: N/A;  . Atrial fibrillation ablation N/A 02/05/2013    Procedure: ATRIAL FIBRILLATION ABLATION;  Surgeon: Coralyn Mark, MD;  Location: [place] CATH LAB;  Service: Cardiovascular;  Laterality: N/A;    SOCHx:  reports that he has never smoked. He does not have any smokeless tobacco history on file. He reports that he does not drink alcohol or use illicit drugs.  FAMHx: Family History  Problem  Relation Age of Onset  . Emphysema Mother   . Lung cancer Father   . Diabetes Brother   . Colon cancer Neg Hx     ALLERGIES: Allergies  Allergen Reactions  . Phenergan [Promethazine Hcl] Itching    Hallucination   . Aspirin     Hx of ulcer   . Bystolic [Nebivolol Hcl] Swelling    Bradycardia   . Calcium Channel Blockers Swelling    LE edema  . Prednisone     Hx of ulcer  . Nsaids     Hx ulcer    ROS: Pertinent items are noted in HPI. see H&P for complete ROS  HOME MEDICATIONS: Prior to Admission medications   Medication Sig Start Date End Date Taking? Authorizing Provider  albuterol (PROVENTIL) (2.5 MG/3ML) 0.083% nebulizer solution Take 2.5 mg by nebulization 2 (two) times daily as needed for wheezing or shortness of breath.  05/22/13  Yes Historical Provider, MD  amiodarone (PACERONE) 200 MG tablet TAKE 1 TABLET BY MOUTH ON MONDAY, WEDNESDAY, FRIDAY, SATURDAY, & SUNDAY AND TAKE ONE-HALF (1/2) TABLET BY MOUTH ON TUESDAY & THURSDAY 11/11/14  Yes Jettie Booze, MD  Canagliflozin (INVOKANA) 300 MG TABS Take 1 tablet by mouth daily.   Yes Historical Provider, MD  carvedilol (COREG) 25 MG tablet TAKE 1 TABLET BY MOUTH TWICE DAILY 05/05/14  Yes Jettie Booze, MD  Cholecalciferol (VITAMIN D) 2000 UNITS CAPS Take 2,000 Units by mouth daily.   Yes Historical Provider, MD  ferrous sulfate 325 (65 FE) MG tablet Take 1 tablet (325 mg total) by mouth 3 (three) times daily with meals. Patient taking differently: Take 325 mg by mouth daily with breakfast.  02/01/13  Yes Thompson Grayer, MD  furosemide (LASIX) 40 MG tablet Take 0.5 tablets (20 mg total) by mouth daily. 03/28/14  Yes Thompson Grayer, MD  GLUCOSAMINE PO Take 1,000 mg by mouth daily.    Yes Historical Provider, MD  imipramine (TOFRANIL) 25 MG tablet Take 50 mg by mouth at bedtime.    Yes Historical Provider, MD  levothyroxine (SYNTHROID, LEVOTHROID) 200 MCG tablet Take 200 mcg by mouth daily.   Yes Historical Provider, MD   losartan (COZAAR) 100 MG tablet Take 1 tablet (100 mg total) by mouth daily. 07/09/14  Yes Jettie Booze, MD  Multiple Vitamins-Minerals (MENS 50+ MULTI VITAMIN/MIN PO) Take 1 tablet by mouth daily.   Yes Historical Provider, MD  pantoprazole (PROTONIX) 40 MG tablet Take 40 mg by mouth daily.    Yes Historical Provider, MD  polycarbophil (FIBERCON) 625 MG tablet Take 1,250 mg by mouth daily.   Yes Historical Provider, MD  potassium chloride SA (K-DUR,KLOR-CON) 20 MEQ tablet Take 2 tablets (40 mEq total) by mouth daily. Patient taking differently: Take 40 mEq by mouth 2 (two) times daily.  07/09/14  Yes Jettie Booze, MD  pravastatin (PRAVACHOL) 20 MG tablet Take 10 mg by mouth at bedtime.    Yes Historical Provider, MD  XARELTO 15 MG TABS tablet TAKE 1 TABLET BY MOUTH ONE TIME DAILY Patient taking differently: TAKE 1 TABLET BY MOUTH every evening. 05/05/14  Yes Jettie Booze, MD    HOSPITAL MEDICATIONS: I have reviewed the patient's current medications.  VITALS: Blood pressure 121/67, pulse 132, temperature 98 F (36.7 C), temperature source Oral, resp. rate 18, height 5\' 8"  (1.727 m), weight 226 lb 13.7 oz (102.9 kg), SpO2 99 %.  PHYSICAL EXAM: General appearance: alert, cooperative and no distress Neck: no carotid bruit and no JVD Lungs: clear to auscultation bilaterally Heart: regular rythm, fast rate Abdomen: soft, non-tender; bowel sounds normal; no masses,  no organomegaly Extremities: no edema Pulses: 2+ and symmetric Skin: pale, cool, dry Neurologic: Grossly normal  LABS: Results for orders placed or performed during the hospital encounter of 11/22/14 (from the past 24 hour(s))  Basic metabolic panel     Status: Abnormal   Collection Time: 11/24/14  4:30 AM  Result Value Ref Range   Sodium 139 135 - 145 mmol/L   Potassium 3.2 (L) 3.5 - 5.1 mmol/L   Chloride 108 101 - 111 mmol/L   CO2 23 22 - 32 mmol/L   Glucose, Bld 135 (H) 65 - 99 mg/dL   BUN 11 6 - 20  mg/dL   Creatinine, Ser 1.22 0.61 - 1.24 mg/dL   Calcium 8.4 (L) 8.9 - 10.3 mg/dL   GFR calc non Af Amer >60 >60 mL/min   GFR calc Af Amer >60 >60 mL/min   Anion gap 8 5 - 15  CBC     Status: None   Collection Time: 11/24/14  4:30 AM  Result Value Ref Range   WBC 7.3 4.0 - 10.5 K/uL   RBC 5.00 4.22 - 5.81 MIL/uL   Hemoglobin 15.9 13.0 - 17.0 g/dL   HCT 46.6 39.0 - 52.0 %   MCV 93.2 78.0 - 100.0 fL   MCH 31.8 26.0 - 34.0 pg   MCHC 34.1 30.0 - 36.0 g/dL   RDW 13.7 11.5 - 15.5 %   Platelets 161 150 - 400 K/uL    EKG: A flutter with 2:1 conduction  IMAGING: Dg Chest 2 View  11/22/2014   CLINICAL DATA:  64 year old male with cardiac arrhythmia. Chest pain radiating to the right arm. Initial encounter.  EXAM: CHEST  2 VIEW  COMPARISON:  03/04/2013 and earlier.  FINDINGS: Interval resolved pulmonary interstitial edema. Lung volumes remain normal. Normal cardiac size and mediastinal contours. No pneumothorax, pleural effusion or confluent pulmonary opacity. No acute osseous abnormality identified. Evidence of previous thyroidectomy.  IMPRESSION: No acute cardiopulmonary abnormality.   Electronically Signed   By: Genevie Ann M.D.   On: 11/22/2014 13:53    IMPRESSION: Principal Problem:   Atrial flutter with rapid ventricular response Active Problems:   Syncope   Orthostatic hypotension   Hypertension   Chronic diastolic heart failure   CKD (chronic kidney disease), stage III   PAF- DCCV Nov 2014- Amiodarone   S/P ablation of atrial flutter-2013 and Oct 2014   Type 2 diabetes mellitus with stage 3 chronic kidney disease   Obstructive sleep apnea-C-pap intol   Dyslipidemia   Chronic anticoagulation-Xarelto   Hypothyroidism   RECOMMENDATION: DCCV this afternoon in EP lab. Replace K+. Check TSH  Time Spent Directly with Patient: 40 minutes  Erlene Quan [phone] beeper 11/24/2014, 9:24 AM   I have seen and examined this patient with >Luke Kilroy.   Agree with above, note  added to reflect my  findings.  On exam, he is tachycardia with clear lungs.  He is feeling poorly with weakness, dizziness and syncope when getting an Xray.  It appears that he is in some atrial flutter, difficult to determine due to the prior ablations.  His creatinine incrased in admission but has come down nicely now.  At this point, Mafalda Mcginniss plan for cardioversion.  Valma Rotenberg also increase his amiodarone to 200 mg twice daily and have him follow up in AF clinic in 4-5 days.  ASA class II Mallampati 2  Jillane Po M. Maddyx Vallie MD 11/24/2014 10:43 AM

## 2014-11-24 NOTE — Progress Notes (Signed)
SUBJECTIVE:  Still has palpitations, worse if he stands.  Typical for his atrial arrhythmias  OBJECTIVE:   Vitals:   Filed Vitals:   11/23/14 2130 11/23/14 2135 11/23/14 2223 11/24/14 0531  BP: 133/102 132/96  121/67  Pulse: 132   132  Temp: 97.8 F (36.6 C)   98 F (36.7 C)  TempSrc: Oral   Oral  Resp: 18  20 18   Height:      Weight:      SpO2: 99%  100% 99%   I&O's:   Intake/Output Summary (Last 24 hours) at 11/24/14 2992 Last data filed at 11/24/14 4268  Gross per 24 hour  Intake    639 ml  Output   2850 ml  Net  -2211 ml   TELEMETRY: Reviewed telemetry pt in atrial flutter with variable rate control:     PHYSICAL EXAM General: Well developed, well nourished, in no acute distress Head:   Normal cephalic and atramatic  Lungs:   Clear bilaterally to auscultation. Heart:  Irregularly irregular S1 S2  No JVD.   Abdomen: abdomen soft and non-tender Msk:  Back normal,  Normal strength and tone for age. Extremities:   No edema.   Neuro: Alert and oriented. Psych:  Normal affect, responds appropriately Skin: No rash   LABS: Basic Metabolic Panel:  Recent Labs  11/23/14 0419 11/24/14 0430  NA 140 139  K 3.4* 3.2*  CL 111 108  CO2 21* 23  GLUCOSE 149* 135*  BUN 18 11  CREATININE 1.42* 1.22  CALCIUM 8.4* 8.4*   Liver Function Tests: No results for input(s): AST, ALT, ALKPHOS, BILITOT, PROT, ALBUMIN in the last 72 hours. No results for input(s): LIPASE, AMYLASE in the last 72 hours. CBC:  Recent Labs  11/23/14 0419 11/24/14 0430  WBC 7.5 7.3  HGB 15.4 15.9  HCT 46.1 46.6  MCV 94.9 93.2  PLT 161 161   Cardiac Enzymes:  Recent Labs  11/22/14 1720 11/22/14 2157 11/23/14 0419  TROPONINI 0.05* 0.07* 0.07*   BNP: Invalid input(s): POCBNP D-Dimer: No results for input(s): DDIMER in the last 72 hours. Hemoglobin A1C: No results for input(s): HGBA1C in the last 72 hours. Fasting Lipid Panel: No results for input(s): CHOL, HDL, LDLCALC, TRIG,  CHOLHDL, LDLDIRECT in the last 72 hours. Thyroid Function Tests: No results for input(s): TSH, T4TOTAL, T3FREE, THYROIDAB in the last 72 hours.  Invalid input(s): FREET3 Anemia Panel: No results for input(s): VITAMINB12, FOLATE, FERRITIN, TIBC, IRON, RETICCTPCT in the last 72 hours. Coag Panel:   Lab Results  Component Value Date   INR 1.87* 11/19/2014   INR 1.90* 12/06/2011   INR 0.95 01/08/2011    RADIOLOGY: Dg Chest 2 View  11/22/2014   CLINICAL DATA:  64 year old male with cardiac arrhythmia. Chest pain radiating to the right arm. Initial encounter.  EXAM: CHEST  2 VIEW  COMPARISON:  03/04/2013 and earlier.  FINDINGS: Interval resolved pulmonary interstitial edema. Lung volumes remain normal. Normal cardiac size and mediastinal contours. No pneumothorax, pleural effusion or confluent pulmonary opacity. No acute osseous abnormality identified. Evidence of previous thyroidectomy.  IMPRESSION: No acute cardiopulmonary abnormality.   Electronically Signed   By: Genevie Ann M.D.   On: 11/22/2014 13:53   Ct Head Wo Contrast  11/19/2014   CLINICAL DATA:  Headache  EXAM: CT HEAD WITHOUT CONTRAST  TECHNIQUE: Contiguous axial images were obtained from the base of the skull through the vertex without intravenous contrast.  COMPARISON:  07/24/2013  FINDINGS: Paranasal sinuses  and mastoid air cells are unremarkable. No intracranial hemorrhage, mass effect or midline shift. No acute cortical infarction. No mass lesion is noted on this unenhanced scan. Mild cerebral atrophy. Ventricular size is stable from prior exam.  IMPRESSION: No acute intracranial abnormality.  Mild cerebral atrophy.   Electronically Signed   By: Lahoma Crocker M.D.   On: 11/19/2014 12:20      ASSESSMENT: Kathyrn Lass:    1) Atrial flutter: Planned for DCCV today.  Unable to get him on the schedule in the endo suite.  WIll see if EP can do in the cath lab.  Dr. Percival Spanish has already spoken to the patient regarding convergent procedure at [facility].   Will seek EP opinion.  Dr. Curt Bears to see later today.    Patient and family are insistent that the cardioversion be done today.  He has not missed any doses of his anticoagulation.      Jettie Booze, MD  11/24/2014  9:38 AM

## 2014-11-24 NOTE — Progress Notes (Signed)
Utilization review completed.  

## 2014-11-24 NOTE — Progress Notes (Signed)
PT Cancellation Note  Patient Details Name: [name] [name] MRN: [id number] DOB: 1951/04/07   Cancelled Treatment:    Reason Eval/Treat Not Completed: Patient at procedure or test/unavailable; currently in cath lab for cardioversion.  Will attempt to see tomorrow.   WYNN,CYNDI 11/24/2014, 1:41 PM Magda Kiel, [place] 11/24/2014

## 2014-11-24 NOTE — Discharge Summary (Signed)
Physician Discharge Summary  [name] [name]:[id number] DOB: 18-Jul-1950 DOA: 11/22/2014  PCP: Donnajean Lopes, MD  Admit date: 11/22/2014 Discharge date: 11/24/2014  Time spent: 45 minutes  Recommendations for Outpatient Follow-up:  1. Dr.Allred 8/19,. Please evaluate need for diuretics upon FU 8/19, amiodarone dose increased please monitor for side effects  Discharge Diagnoses:  Principal Problem:   Atrial flutter with rapid ventricular response Active Problems:   Obstructive sleep apnea-C-pap intol   Hypertension   Chronic diastolic heart failure   CKD (chronic kidney disease), stage III   Syncope   PAF- DCCV Nov 2014- Amiodarone   Orthostatic hypotension   S/P ablation of atrial flutter-2013 and Oct 2014   Dyslipidemia   Chronic anticoagulation-Xarelto   Type 2 diabetes mellitus with stage 3 chronic kidney disease   Hypothyroidism   AKI (acute kidney injury)   Atrial fibrillation with RVR   Discharge Condition: stable  Diet recommendation: low sodium  Filed Weights   11/22/14 1606  Weight: 102.9 kg (226 lb 13.7 oz)    History of present illness:  Chief Complaint Palpitations and dizziness HPI:  The patient has a past history of atrial fib, ablations, failed meds, admitted due to dizziness. In the ED he was found to atrial fib with RVR. He was treated with Cardizem. He did have a low BP and had syncope when he stood in radiology. He had significant orthostatic symptoms and BP drop. In the ED he was found to have an increased creat above his baseline.   Hospital Course:  Afib with RVR -HR was uncontrolled, esp with activity and symptomatic -on PO Amiodarone,dose increased to 200mg  BID and continued on  25mg  of Coreg -Seen by EP, underwent DCCV today, converted to NSR and cleared for discharge by Cardiology today -has been difficult to Rx ( previous ablations, antiarrhythmics etc) -on Xarelto for anticoagulation  Syncope -due to Orthostatic  hypotension/dehydration/Afib RVR -stop IVF rate, lungs clear today -ECHO done report pending  Elevated troponin -mild, non ACS pattern, suspect demand due to Afib/RVR -continue Coreg, stable -please FU ECHO upon FU 8/19  Hypertension:  -improved, held diuretics, resume Coreg   Acute on chronic kidney disease stage III:  -ARF likely secondary to prerenal azotemia from diuretics/losartan -resolved, stopped ARB -was hydrated for 48hours, and diuretics on hold until FU on Friday  . Polycythemia:  -Secondary to hemoconcentration/dehydration -resolved with hydration   Procedures:  DCCV  Consultations:  EP  Discharge Exam: Filed Vitals:   11/24/14 1520  BP: 114/70  Pulse: 64  Temp: 97.3 F (36.3 C)  Resp: 18    General: AAO.x3 Cardiovascular:S1S2/RRR Respiratory: CTAB  Discharge Instructions    Current Discharge Medication List    CONTINUE these medications which have CHANGED   Details  amiodarone (PACERONE) 200 MG tablet Take 1 tablet (200 mg total) by mouth 2 (two) times daily. Qty: 60 tablet, Refills: 11    ferrous sulfate 325 (65 FE) MG tablet Take 1 tablet (325 mg total) by mouth daily with breakfast. Qty: 90 tablet, Refills: 1    potassium chloride SA (K-DUR,KLOR-CON) 20 MEQ tablet Take 2 tablets (40 mEq total) by mouth daily. Qty: 180 tablet, Refills: 1    Rivaroxaban (XARELTO) 15 MG TABS tablet Take 1 tablet (15 mg total) by mouth every evening. Qty: 90 tablet, Refills: 1      CONTINUE these medications which have NOT CHANGED   Details  albuterol (PROVENTIL) (2.5 MG/3ML) 0.083% nebulizer solution Take 2.5 mg by nebulization 2 (two) times  daily as needed for wheezing or shortness of breath.     Canagliflozin (INVOKANA) 300 MG TABS Take 1 tablet by mouth daily.    carvedilol (COREG) 25 MG tablet TAKE 1 TABLET BY MOUTH TWICE DAILY Qty: 180 tablet, Refills: 1    Cholecalciferol (VITAMIN D) 2000 UNITS CAPS Take 2,000 Units by mouth daily.     GLUCOSAMINE PO Take 1,000 mg by mouth daily.     imipramine (TOFRANIL) 25 MG tablet Take 50 mg by mouth at bedtime.     levothyroxine (SYNTHROID, LEVOTHROID) 200 MCG tablet Take 200 mcg by mouth daily.    Multiple Vitamins-Minerals (MENS 50+ MULTI VITAMIN/MIN PO) Take 1 tablet by mouth daily.    pantoprazole (PROTONIX) 40 MG tablet Take 40 mg by mouth daily.     polycarbophil (FIBERCON) 625 MG tablet Take 1,250 mg by mouth daily.    pravastatin (PRAVACHOL) 20 MG tablet Take 10 mg by mouth at bedtime.       STOP taking these medications     furosemide (LASIX) 40 MG tablet      losartan (COZAAR) 100 MG tablet        Allergies  Allergen Reactions  . Phenergan [Promethazine Hcl] Itching    Hallucination   . Aspirin     Hx of ulcer   . Bystolic [Nebivolol Hcl] Swelling    Bradycardia   . Calcium Channel Blockers Swelling    LE edema  . Prednisone     Hx of ulcer  . Nsaids     Hx ulcer   Follow-up Information    Follow up with Thompson Grayer, MD On 11/28/2014.   Specialty:  Cardiology   Why:  11 am, follow up in the AF clinic    Contact information:   [place] [address] [phone]        The results of significant diagnostics from this hospitalization (including imaging, microbiology, ancillary and laboratory) are listed below for reference.    Significant Diagnostic Studies: Dg Chest 2 View  11/22/2014   CLINICAL DATA:  64 year old male with cardiac arrhythmia. Chest pain radiating to the right arm. Initial encounter.  EXAM: CHEST  2 VIEW  COMPARISON:  03/04/2013 and earlier.  FINDINGS: Interval resolved pulmonary interstitial edema. Lung volumes remain normal. Normal cardiac size and mediastinal contours. No pneumothorax, pleural effusion or confluent pulmonary opacity. No acute osseous abnormality identified. Evidence of previous thyroidectomy.  IMPRESSION: No acute cardiopulmonary abnormality.   Electronically Signed   By: Genevie Ann  M.D.   On: 11/22/2014 13:53   Ct Head Wo Contrast  11/19/2014   CLINICAL DATA:  Headache  EXAM: CT HEAD WITHOUT CONTRAST  TECHNIQUE: Contiguous axial images were obtained from the base of the skull through the vertex without intravenous contrast.  COMPARISON:  07/24/2013  FINDINGS: Paranasal sinuses and mastoid air cells are unremarkable. No intracranial hemorrhage, mass effect or midline shift. No acute cortical infarction. No mass lesion is noted on this unenhanced scan. Mild cerebral atrophy. Ventricular size is stable from prior exam.  IMPRESSION: No acute intracranial abnormality.  Mild cerebral atrophy.   Electronically Signed   By: Lahoma Crocker M.D.   On: 11/19/2014 12:20    Microbiology: No results found for this or any previous visit (from the past 240 hour(s)).   Labs: Basic Metabolic Panel:  Recent Labs Lab 11/19/14 1159 11/22/14 1200 11/22/14 1432 11/22/14 1720 11/23/14 0419 11/24/14 0430  NA 139 141  --  142 140  139  K 3.9 6.2* 3.5 3.8 3.4* 3.2*  CL 107 105  --  112* 111 108  CO2  --  18*  --  22 21* 23  GLUCOSE 111* 102*  --  132* 149* 135*  BUN 27* 22*  --  21* 18 11  CREATININE 1.50* 1.78*  --  1.58* 1.42* 1.22  CALCIUM  --  9.7  --  8.6* 8.4* 8.4*   Liver Function Tests: No results for input(s): AST, ALT, ALKPHOS, BILITOT, PROT, ALBUMIN in the last 168 hours. No results for input(s): LIPASE, AMYLASE in the last 168 hours. No results for input(s): AMMONIA in the last 168 hours. CBC:  Recent Labs Lab 11/19/14 1203 11/22/14 1200 11/22/14 1725 11/23/14 0419 11/24/14 0430  WBC 7.6 10.6* 9.2 7.5 7.3  NEUTROABS 4.4  --   --   --   --   HGB 15.4 19.2* 16.2 15.4 15.9  HCT 44.8 53.2* 47.7 46.1 46.6  MCV 93.5 90.8 93.9 94.9 93.2  PLT 168 228 170 161 161   Cardiac Enzymes:  Recent Labs Lab 11/22/14 1720 11/22/14 2157 11/23/14 0419  TROPONINI 0.05* 0.07* 0.07*   BNP: BNP (last 3 results) No results for input(s): BNP in the last 8760 hours.  ProBNP (last  3 results) No results for input(s): PROBNP in the last 8760 hours.  CBG: No results for input(s): GLUCAP in the last 168 hours.     SignedDomenic Polite  [facility] 11/24/2014, 4:02 PM

## 2014-11-24 NOTE — Progress Notes (Signed)
Echocardiogram 2D Echocardiogram has been performed.  [name] [name] 11/24/2014, 10:56 AM

## 2014-11-25 SURGERY — CARDIOVERSION
Anesthesia: Monitor Anesthesia Care

## 2014-11-26 ENCOUNTER — Telehealth: Payer: Self-pay | Admitting: Internal Medicine

## 2014-11-26 NOTE — Telephone Encounter (Signed)
New message    Patient just release from hospital on Monday- shot for Afib.    Need a referral to see Dr. Jerral Ralph in [place] since he has two fail ablation.  [phone]  Wife is hoping to get appt in Sept 7,8, 9, or less they can do it early.

## 2014-11-26 NOTE — Telephone Encounter (Signed)
Follow up       Calling to give the correct name of the referring doctor---not Jerral Ralph.  It is Dr Domenic Moras at [phone]

## 2014-11-27 ENCOUNTER — Ambulatory Visit (HOSPITAL_COMMUNITY)
Admission: RE | Admit: 2014-11-27 | Discharge: 2014-11-27 | Disposition: A | Discharge status: Home / Self Care | Payer: Managed Care, Other (non HMO) | Source: Ambulatory Visit | Attending: Nurse Practitioner | Admitting: Nurse Practitioner

## 2014-11-27 VITALS — BP 152/92 | HR 55 | Ht 68.0 in | Wt 232.2 lb

## 2014-11-27 DIAGNOSIS — E876 Hypokalemia: Secondary | ICD-10-CM | POA: Diagnosis not present

## 2014-11-27 DIAGNOSIS — E039 Hypothyroidism, unspecified: Secondary | ICD-10-CM | POA: Diagnosis not present

## 2014-11-27 DIAGNOSIS — I129 Hypertensive chronic kidney disease with stage 1 through stage 4 chronic kidney disease, or unspecified chronic kidney disease: Secondary | ICD-10-CM | POA: Insufficient documentation

## 2014-11-27 DIAGNOSIS — E1122 Type 2 diabetes mellitus with diabetic chronic kidney disease: Secondary | ICD-10-CM | POA: Diagnosis not present

## 2014-11-27 DIAGNOSIS — I481 Persistent atrial fibrillation: Secondary | ICD-10-CM | POA: Diagnosis not present

## 2014-11-27 DIAGNOSIS — Z833 Family history of diabetes mellitus: Secondary | ICD-10-CM | POA: Insufficient documentation

## 2014-11-27 DIAGNOSIS — N183 Chronic kidney disease, stage 3 (moderate): Secondary | ICD-10-CM | POA: Insufficient documentation

## 2014-11-27 DIAGNOSIS — G4733 Obstructive sleep apnea (adult) (pediatric): Secondary | ICD-10-CM | POA: Diagnosis not present

## 2014-11-27 DIAGNOSIS — I4891 Unspecified atrial fibrillation: Secondary | ICD-10-CM | POA: Diagnosis present

## 2014-11-27 DIAGNOSIS — I4819 Other persistent atrial fibrillation: Secondary | ICD-10-CM

## 2014-11-27 DIAGNOSIS — Z79899 Other long term (current) drug therapy: Secondary | ICD-10-CM | POA: Diagnosis not present

## 2014-11-27 DIAGNOSIS — Z7902 Long term (current) use of antithrombotics/antiplatelets: Secondary | ICD-10-CM | POA: Diagnosis not present

## 2014-11-27 LAB — BASIC METABOLIC PANEL
Anion gap: 8 (ref 5–15)
BUN: 14 mg/dL (ref 6–20)
CALCIUM: 9.1 mg/dL (ref 8.9–10.3)
CHLORIDE: 107 mmol/L (ref 101–111)
CO2: 25 mmol/L (ref 22–32)
CREATININE: 1.35 mg/dL — AB (ref 0.61–1.24)
GFR calc Af Amer: >60 mL/min (ref >60)
GFR calc non Af Amer: 54 mL/min — ABNORMAL LOW (ref >60)
GLUCOSE: 116 mg/dL — AB (ref 65–99)
Potassium: 3.2 mmol/L — ABNORMAL LOW (ref 3.5–5.1)
Sodium: 140 mmol/L (ref 135–145)

## 2014-11-27 MED ORDER — AMIODARONE HCL 200 MG PO TABS: 200.0000 mg | ORAL_TABLET | Freq: Two times a day (BID) | ORAL | Status: DC

## 2014-11-27 NOTE — Patient Instructions (Signed)
Parking code for September 0090 

## 2014-11-27 NOTE — Telephone Encounter (Signed)
Has an appointment in [place] clinic 8/18.  Will see is Lenna Sciara has gotten referral info from hospital

## 2014-11-28 ENCOUNTER — Encounter (HOSPITAL_COMMUNITY): Payer: Self-pay | Admitting: Nurse Practitioner

## 2014-11-28 ENCOUNTER — Encounter (HOSPITAL_COMMUNITY): Payer: Managed Care, Other (non HMO) | Admitting: Nurse Practitioner

## 2014-11-28 NOTE — Progress Notes (Signed)
Patient ID: [name] [name], male   DOB: 1950/12/09, 64 y.o.   MRN: [id number]     Primary Care Physician: Donnajean Lopes, MD Referring Physician: [facility] f/u EP: Dr.    Livia [name] is a 64 y.o. male with a h/o atrial fib, ablations, failed meds, admitted due to dizziness. In the ED he was found to atrial fib with RVR. He was treated with Cardizem. He did have a low BP and had syncope when he stood in radiology. He had significant orthostatic symptoms and BP drop. In the ED he was found to have an increased creat above his baseline.HR was uncontrolled, esp with activity and symptomatic PO Amiodarone,dose increased to 200mg  BID and continued on 25mg  of Coreg.   He was seen by EP, underwent DCCV, converted to NSR and cleared for discharge by Cardiology. He has been difficult to Rx ( previous ablations, antiarrhythmics etc). On Xarelto for anticoagulation  F/u in the afib clinic shows BP has improved and elevated at 150 sys.Some meds were held in hospital for hypotension and will be resumed. Kt low in hospital but will resume at home with previous dose. Pt and his wife are very frustrated due to his intolerance of afib with hypotension and syncope  And states "he cant go on like this and I don't want him on the higher dose of amiodarone for ever.They are interested in the hybrid procedure at [facility]. He is maintaining sinus rhythm since d/c.   Today, he denies symptoms of palpitations, chest pain, shortness of breath, orthopnea, PND, lower extremity edema, dizziness, presyncope, syncope, or neurologic sequela. The patient is tolerating medications without difficulties and is otherwise without complaint today.   Past Medical History  Diagnosis Date  . Hypertension   . PAF (paroxysmal atrial fibrillation)     a. failed DCCV and multiple anti-arrhythmic drugs (multaq/flecainide);   b. s/p  PVI isolation with ablation of AFib 8/13; c. repeat PVI 01/2013;  d. 02/2013 repeat DCCV->Amio  load/xarelto.  . Renal calculi   . Chronic combined systolic and diastolic CHF (congestive heart failure)     a. LVEF previously 35% felt to be due tachycardia;  b. 02/2013 Echo: EF 50-55%, no rwma, mildly dil LA/RA, mild to mod MR.  . Diabetes mellitus   . Obesities, morbid   . Hypothyroid   . Gastric ulcer     s/p prior surgery  . Diverticulosis   . Erectile dysfunction   . Obstructive sleep apnea     noncompliant with CPAP  . Atrial flutter     a. s/p RFCA 8/13  . CKD (chronic kidney disease), stage III     hx of a/c renal failure during episode of diverticulitis 9/13 in [place], [place]  . Anemia    Past Surgical History  Procedure Laterality Date  . Wrist sx  1980    right  . Knee sx  1985    both  . Benign stomach tumor removal  2000  . Thyroidectomy  2009    cysts removal   . Laparoscopic retropubic prostatectomy  02/2007    hx prostate cancer  . Kidney stone removal      multiple  . Tee without cardioversion  08/25/2011    Procedure: TRANSESOPHAGEAL ECHOCARDIOGRAM (TEE);  Surgeon: Jettie Booze, MD;  Location: [facility];  Service: Cardiovascular;  Laterality: N/A;  . Cardioversion  08/25/2011    Procedure: CARDIOVERSION;  Surgeon: Jettie Booze, MD;  Location: [facility];  Service: Cardiovascular;  Laterality: N/A;  .  Tee without cardioversion  11/09/2011    Procedure: TRANSESOPHAGEAL ECHOCARDIOGRAM (TEE);  Surgeon: Jettie Booze, MD;  [facility] ENDOSCOPY;  Service: Cardiovascular;  Laterality: N/A;  h/p in file drawer  . Atrial fibrillation ablation  12/06/11; 02/05/2013    Afib and atrial flutter ablation by Dr Rayann Heman; repeat PVI by Dr Rayann Heman 02/05/2013  . Tee without cardioversion N/A 02/05/2013    Procedure: TRANSESOPHAGEAL ECHOCARDIOGRAM (TEE);  Surgeon: Candee Furbish, MD;  Location: [facility] ENDOSCOPY;  Service: Cardiovascular;  Laterality: N/A;  . Cardioversion N/A 03/05/2013    Procedure: CARDIOVERSION;  Surgeon: Sinclair Grooms, MD;   Location: [facility];  Service: Cardiovascular;  Laterality: N/A;  BEDSIDE   . Esophagogastroduodenoscopy N/A 04/09/2013    Procedure: ESOPHAGOGASTRODUODENOSCOPY (EGD) with possible Balloon Dilation;  Surgeon: Garlan Fair, MD;  Location: [facility] ENDOSCOPY;  Service: Endoscopy;  Laterality: N/A;  . Atrial fibrillation ablation N/A 12/06/2011    Procedure: ATRIAL FIBRILLATION ABLATION;  Surgeon: Thompson Grayer, MD;  Location: [facility] CATH LAB;  Service: Cardiovascular;  Laterality: N/A;  . Atrial fibrillation ablation N/A 02/05/2013    Procedure: ATRIAL FIBRILLATION ABLATION;  Surgeon: Coralyn Mark, MD;  Location: [place] CATH LAB;  Service: Cardiovascular;  Laterality: N/A;  . Electrophysiologic study N/A 11/24/2014    Procedure: Cardioversion;  Surgeon: Will Meredith Leeds, MD;  Location: [place] CV LAB;  Service: Cardiovascular;  Laterality: N/A;    Current Outpatient Prescriptions  Medication Sig Dispense Refill  . albuterol (PROVENTIL) (2.5 MG/3ML) 0.083% nebulizer solution Take 2.5 mg by nebulization 2 (two) times daily as needed for wheezing or shortness of breath.     Marland Kitchen amiodarone (PACERONE) 200 MG tablet Take 1 tablet (200 mg total) by mouth 2 (two) times daily. 60 tablet 11  . Canagliflozin (INVOKANA) 300 MG TABS Take 1 tablet by mouth daily.    . carvedilol (COREG) 25 MG tablet TAKE 1 TABLET BY MOUTH TWICE DAILY 180 tablet 1  . Cholecalciferol (VITAMIN D) 2000 UNITS CAPS Take 2,000 Units by mouth daily.    . ferrous sulfate 325 (65 FE) MG tablet Take 1 tablet (325 mg total) by mouth daily with breakfast. 90 tablet 1  . GLUCOSAMINE PO Take 1,000 mg by mouth daily.     Marland Kitchen imipramine (TOFRANIL) 25 MG tablet Take 50 mg by mouth at bedtime.     Marland Kitchen levothyroxine (SYNTHROID, LEVOTHROID) 200 MCG tablet Take 200 mcg by mouth daily.    . Multiple Vitamins-Minerals (MENS 50+ MULTI VITAMIN/MIN PO) Take 1 tablet by mouth daily.    . pantoprazole (PROTONIX) 40 MG tablet Take 40 mg by mouth daily.     .  polycarbophil (FIBERCON) 625 MG tablet Take 1,250 mg by mouth daily.    . potassium chloride SA (K-DUR,KLOR-CON) 20 MEQ tablet Take 2 tablets (40 mEq total) by mouth daily. 180 tablet 1  . pravastatin (PRAVACHOL) 20 MG tablet Take 10 mg by mouth at bedtime.     . Rivaroxaban (XARELTO) 15 MG TABS tablet Take 1 tablet (15 mg total) by mouth every evening. 90 tablet 1   No current facility-administered medications for this encounter.    Allergies  Allergen Reactions  . Phenergan [Promethazine Hcl] Itching    Hallucination   . Aspirin     Hx of ulcer   . Bystolic [Nebivolol Hcl] Swelling    Bradycardia   . Calcium Channel Blockers Swelling    LE edema  . Prednisone     Hx of ulcer  . Nsaids  Hx ulcer    Social History   Social History  . Marital Status: Married    Spouse Name: N/A  . Number of Children: N/A  . Years of Education: N/A   Occupational History  . Not on file.   Social History Main Topics  . Smoking status: Never Smoker   . Smokeless tobacco: Not on file  . Alcohol Use: No  . Drug Use: No  . Sexual Activity: Yes   Other Topics Concern  . Not on file   Social History Narrative   Lives in [place].  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 Emphysema Mother   . Lung cancer Father   . Diabetes Brother   . Colon cancer Neg Hx     ROS- All systems are reviewed and negative except as per the HPI above  Physical Exam: Filed Vitals:   11/27/14 1440  BP: 152/92  Pulse: 55  Height: 5\' 8"  (1.727 m)  Weight: 232 lb 3.2 oz (105.325 kg)    GEN- The patient is well appearing, alert and oriented x 3 today.   Head- normocephalic, atraumatic Eyes-  Sclera clear, conjunctiva pink Ears- hearing intact Oropharynx- clear Neck- supple, no JVP Lymph- no cervical lymphadenopathy Lungs- Clear to ausculation bilaterally, normal work of breathing Heart- Regular rate and rhythm, no murmurs, rubs or  gallops, PMI not laterally displaced GI- soft, NT, ND, + BS Extremities- no clubbing, cyanosis, or edema MS- no significant deformity or atrophy Skin- no rash or lesion Psych- euthymic mood, full affect Neuro- strength and sensation are intact  EKG- Sinus brady at 55 bpm, Pr int 174 ms, QRS 98 ms, QTc 440 ms Epic records reviewed  Assessment and Plan: 1.Symptomatic afib with RVR/hypotension/syncope In SR with amiodarone at 200 mg bid Will try to help facilitative appointment with [facility] for hybrid procedure. Continue amiodarone at 200 mg bid for now Carvedilol at 25 mg bid   2. HTN Resume losartan, diuretic  3. Hypokalemia Will resume 40 meq daily Recheck Kt next week  F/u in afib clinic in one month, will advise when [facility] appointment is scheduled.  Geroge Baseman [facility] [place], [place] [address] [phone]  Resume diuretic, Kt and

## 2014-12-01 ENCOUNTER — Telehealth (HOSPITAL_COMMUNITY): Payer: Self-pay

## 2014-12-01 ENCOUNTER — Other Ambulatory Visit (HOSPITAL_COMMUNITY): Payer: Self-pay

## 2014-12-01 ENCOUNTER — Telehealth (HOSPITAL_COMMUNITY): Payer: Self-pay | Admitting: Nurse Practitioner

## 2014-12-01 MED ORDER — DILTIAZEM HCL 30 MG PO TABS: 30.0000 mg | ORAL_TABLET | ORAL | Status: DC | PRN

## 2014-12-01 NOTE — Telephone Encounter (Signed)
Wife states that pt has been in and out with afib. Wanting referral to Dr. Lehman Prom for hybrid. [facility] office notified and they will send referral form. In the interim will send in rx for cardizem 30 mg tab to use if HR is high over 100 and sys Bp is over 100.

## 2014-12-01 NOTE — Telephone Encounter (Signed)
Cardizem 30 mg as needed was px to pt. Pt had a allergy reaction to amlodipine which both medications are calcium channel blockers, however since pt will only be taking Cardizem as needed this should not put him at risk per Butch Penny.

## 2014-12-01 NOTE — Telephone Encounter (Signed)
Referring to [facility] to discuss ablation.  Melissa will send referral

## 2014-12-03 ENCOUNTER — Ambulatory Visit (HOSPITAL_COMMUNITY)
Admission: RE | Admit: 2014-12-03 | Discharge: 2014-12-03 | Disposition: A | Discharge status: Home / Self Care | Payer: Managed Care, Other (non HMO) | Source: Ambulatory Visit | Attending: Nurse Practitioner | Admitting: Nurse Practitioner

## 2014-12-03 DIAGNOSIS — I480 Paroxysmal atrial fibrillation: Secondary | ICD-10-CM

## 2014-12-03 DIAGNOSIS — I481 Persistent atrial fibrillation: Secondary | ICD-10-CM | POA: Insufficient documentation

## 2014-12-03 LAB — POTASSIUM: Potassium: 3.6 mmol/L (ref 3.5–5.1)

## 2014-12-04 LAB — BASIC METABOLIC PANEL
ANION GAP: 10 (ref 5–15)
BUN: 16 mg/dL (ref 6–20)
CHLORIDE: 105 mmol/L (ref 101–111)
CO2: 23 mmol/L (ref 22–32)
CREATININE: 1.46 mg/dL — AB (ref 0.61–1.24)
Calcium: 8.6 mg/dL — ABNORMAL LOW (ref 8.9–10.3)
GFR calc non Af Amer: 49 mL/min — ABNORMAL LOW (ref >60)
GFR, EST AFRICAN AMERICAN: 57 mL/min — AB (ref >60)
Glucose, Bld: 99 mg/dL (ref 65–99)
POTASSIUM: 3.6 mmol/L (ref 3.5–5.1)
SODIUM: 138 mmol/L (ref 135–145)

## 2014-12-19 NOTE — Telephone Encounter (Signed)
dup

## 2014-12-22 ENCOUNTER — Telehealth (HOSPITAL_COMMUNITY): Payer: Self-pay | Admitting: *Deleted

## 2014-12-22 NOTE — Telephone Encounter (Signed)
Pt wife called in stating patient had gained 6lbs over weekend and his lasix was recently decreased to every other day.  Instructed patient to restart lasix daily and if continues to gain weight needs to call back and be seen. Patient was not having shortness of breath or any other symptoms other than noticed the weight gain.  Patient wife verbalized understanding.

## 2014-12-26 ENCOUNTER — Other Ambulatory Visit: Payer: Self-pay

## 2014-12-26 MED ORDER — RIVAROXABAN 15 MG PO TABS: 15.0000 mg | ORAL_TABLET | Freq: Every evening | ORAL | Status: DC

## 2014-12-26 MED ORDER — CARVEDILOL 25 MG PO TABS: 25.0000 mg | ORAL_TABLET | Freq: Two times a day (BID) | ORAL | Status: DC

## 2014-12-26 NOTE — Telephone Encounter (Signed)
Thompson Grayer, MD at 03/14/2014 3:17 PM  carvedilol (COREG) 25 MG tabletTAKE 1 TABLET BY MOUTH TWICE DAILY  Discharge date: 11/24/2014 Hospital Course:  Afib with RVR -HR was uncontrolled, esp with activity and symptomatic -on PO Amiodarone,dose increased to 200mg  BID and continued on 25mg  of Coreg -Seen by EP, underwent DCCV today, converted to NSR and cleared for discharge by Cardiology today -has been difficult to Rx ( previous ablations, antiarrhythmics etc) -on Xarelto for anticoagulation  Rivaroxaban (XARELTO) 15 MG TABS tablet Take 1 tablet (15 mg total) by mouth every evening. Qty: 90 tablet, Refills: 1

## 2014-12-30 ENCOUNTER — Inpatient Hospital Stay (HOSPITAL_COMMUNITY)
Admission: RE | Admit: 2014-12-30 | Payer: Managed Care, Other (non HMO) | Source: Ambulatory Visit | Admitting: Nurse Practitioner

## 2015-01-08 ENCOUNTER — Telehealth (HOSPITAL_COMMUNITY): Payer: Self-pay | Admitting: *Deleted

## 2015-01-08 MED ORDER — DILTIAZEM HCL 30 MG PO TABS: 30.0000 mg | ORAL_TABLET | ORAL | Status: DC | PRN

## 2015-01-08 NOTE — Telephone Encounter (Signed)
Patient's wife (bonnie) called in stating he was referred to dr. Lehman Prom at [facility] for hybrid procedure.  Wife is concerned because they were told he would have to reduce his amiodarone to 100mg  once a day for the 6 week period prior to procedure and would like Dr. Jackalyn Lombard opinion on this.  He is currently on 200mg  BID of Amiodarone.  She is afraid since he is so symptomatic when he is in afib that he will not do well with decreasing amiodarone dose.  Will forward to Dr. Jackalyn Lombard nurse Claiborne Billings.  Please call wife, Horris Latino [phone] with Dr. Jackalyn Lombard thoughts on decreasing the Amiodarone for the 6 weeks prior to procedure.

## 2015-01-12 NOTE — Telephone Encounter (Signed)
Ok to reduce amiodarone as per Dr Heide Guile suggestion

## 2015-01-12 NOTE — Telephone Encounter (Signed)
Pt wife notified. Still very concerned of what to do if he goes into afib. Encouraged patient to use PRN cardizem as prescribed and went over parameters for this.

## 2015-01-20 ENCOUNTER — Telehealth: Payer: Self-pay | Admitting: Internal Medicine

## 2015-01-20 NOTE — Telephone Encounter (Signed)
New message      Request for surgical clearance:  1. What type of surgery is being performed?  Back injection   When is this surgery scheduled? 02-10-15 2. Are there any medications that need to be held prior to surgery and how long? Stop xarelto 3 days prior  3. Name of physician performing surgery? Dr Nelva Bush  4. What is your office phone and fax number? Fax [phone]

## 2015-01-21 NOTE — Telephone Encounter (Signed)
Pt has a CHADS score of 5.  Had a TIA in 2014.  Will make sure with Dr. Irish Lack okay to hold x 72 hours.

## 2015-01-21 NOTE — Telephone Encounter (Signed)
I agree.  I don't think we need to bridge from Xarelto.  Can he restart the day after the injection?

## 2015-01-22 NOTE — Telephone Encounter (Signed)
Will fax to Dr. Nelva Bush office and request restart Xarelto day after injection.

## 2015-02-23 ENCOUNTER — Other Ambulatory Visit (HOSPITAL_COMMUNITY): Payer: Self-pay | Admitting: *Deleted

## 2015-02-23 MED ORDER — DRONEDARONE HCL 400 MG PO TABS: 400.0000 mg | ORAL_TABLET | Freq: Two times a day (BID) | ORAL | Status: DC

## 2015-02-26 HISTORY — PX: OTHER SURGICAL HISTORY: SHX169

## 2015-03-10 ENCOUNTER — Other Ambulatory Visit: Payer: Self-pay | Admitting: *Deleted

## 2015-03-10 MED ORDER — RIVAROXABAN 15 MG PO TABS: 15.0000 mg | ORAL_TABLET | Freq: Every evening | ORAL | Status: DC

## 2015-03-10 MED ORDER — CARVEDILOL 25 MG PO TABS
25.0000 mg | ORAL_TABLET | Freq: Two times a day (BID) | ORAL | Status: DC
Start: 2015-03-10 — End: 2015-04-03

## 2015-04-02 ENCOUNTER — Telehealth (HOSPITAL_COMMUNITY): Payer: Self-pay | Admitting: *Deleted

## 2015-04-02 NOTE — Telephone Encounter (Addendum)
Pt wife called in stating her husband had his hybrid procedure at [facility] (had multiple complications from the procedure and had to be in ICU for 4-5 days) but was eventually discharged home and has been released from their care at last follow up and was instructed to call Dr. Rayann Heman if further issues/concerns.  When he was discharged home he was put a decreased dose of coreg of 12.5mg  twice daily (prior to was on 25mg  twice daily). Now that he is able to tolerate an increase in his activity level they have noticed his BP beginning to increase on this decreased dose of coreg.  BP readings in the mornings are usually within normal range but evening BP readings are in 150/95 range -- he has no symptoms related to the elevated BP.  She was happy to report he is staying in NSR with HR in the 70s. Wife is wanting to know if he should be returned to his normal dosing of coreg 25mg  in the evening since this is when his BP is elevated.   Instructed wife I would forward this to Dr. Jackalyn Lombard nurse, Claiborne Billings. Wife can be reached at [phone]

## 2015-04-03 MED ORDER — CARVEDILOL 12.5 MG PO TABS: 18.7500 mg | ORAL_TABLET | Freq: Two times a day (BID) | ORAL | Status: DC

## 2015-04-03 NOTE — Telephone Encounter (Addendum)
Spoke with wife and let her know to take 18.75 mg bid until we can discuss with Dr Rayann Heman next week.  His HR have been regular and running around 75

## 2015-04-08 NOTE — Telephone Encounter (Signed)
Please schedule follow-up with Butch Penny in AF clinic to discuss

## 2015-04-21 ENCOUNTER — Ambulatory Visit (HOSPITAL_COMMUNITY)
Admission: RE | Admit: 2015-04-21 | Discharge: 2015-04-21 | Disposition: A | Discharge status: Home / Self Care | Payer: Managed Care, Other (non HMO) | Source: Ambulatory Visit | Attending: Nurse Practitioner | Admitting: Nurse Practitioner

## 2015-04-21 VITALS — BP 146/92 | HR 68 | Ht 67.0 in | Wt 236.2 lb

## 2015-04-21 DIAGNOSIS — I10 Essential (primary) hypertension: Secondary | ICD-10-CM | POA: Insufficient documentation

## 2015-04-21 DIAGNOSIS — I4819 Other persistent atrial fibrillation: Secondary | ICD-10-CM

## 2015-04-21 DIAGNOSIS — I481 Persistent atrial fibrillation: Secondary | ICD-10-CM | POA: Insufficient documentation

## 2015-04-22 ENCOUNTER — Encounter (HOSPITAL_COMMUNITY): Payer: Self-pay | Admitting: Nurse Practitioner

## 2015-04-22 NOTE — Progress Notes (Signed)
Patient ID: [name] [name], male   DOB: 12-18-1950, 65 y.o.   MRN: [id number]     Primary Care Physician: Donnajean Lopes, MD Referring Physician: Dr. Elta Guadeloupe [name] [name] is a 65 y.o. male with a h/o pesisitent afib s/p 2 ablations and on 11/17 underwent a convergent afib ablation with Dr. Lehman Prom and Dr. Danise Mina at [facility]. He had some complications with cardiogenic shock per pt and had to be intubated and spend 4 days in intensive care but now is much better and he has not had any atrial fibrillation. He is being followed very closely at [facility] but was having some issues with HBP and was told to increase carvedilol to 18.75 and follow up here. BP today is acceptable but at bedtime it will be sometimes  elevated. He was told at [facility] to take an extra 12.5 mg of carvedilol when that occurred. Otherwise he has no complaints today. He was told he could stop multaq at [facility] but he has been fearful. He will finish this current bottle and stop drug.  Today, he denies symptoms of palpitations, chest pain, shortness of breath, orthopnea, PND, lower extremity edema, dizziness, presyncope, syncope, or neurologic sequela. The patient is tolerating medications without difficulties and is otherwise without complaint today.   Past Medical History  Diagnosis Date  . Hypertension   . PAF (paroxysmal atrial fibrillation) ([place])     a. failed DCCV and multiple anti-arrhythmic drugs (multaq/flecainide);   b. s/p  PVI isolation with ablation of AFib 8/13; c. repeat PVI 01/2013;  d. 02/2013 repeat DCCV->Amio load/xarelto.  . Renal calculi   . Chronic combined systolic and diastolic CHF (congestive heart failure) ([place])     a. LVEF previously 35% felt to be due tachycardia;  b. 02/2013 Echo: EF 50-55%, no rwma, mildly dil LA/RA, mild to mod MR.  . Diabetes mellitus ([place])   . Obesities, morbid ([place])   . Hypothyroid   . Gastric ulcer     s/p prior surgery  . Diverticulosis   . Erectile dysfunction   . Obstructive  sleep apnea     noncompliant with CPAP  . Atrial flutter ([place])     a. s/p RFCA 8/13  . CKD (chronic kidney disease), stage III     hx of a/c renal failure during episode of diverticulitis 9/13 in [place], [place]  . Anemia    Past Surgical History  Procedure Laterality Date  . Wrist sx  1980    right  . Knee sx  1985    both  . Benign stomach tumor removal  2000  . Thyroidectomy  2009    cysts removal   . Laparoscopic retropubic prostatectomy  02/2007    hx prostate cancer  . Kidney stone removal      multiple  . Tee without cardioversion  08/25/2011    Procedure: TRANSESOPHAGEAL ECHOCARDIOGRAM (TEE);  Surgeon: Jettie Booze, MD;  Location: [place];  Service: Cardiovascular;  Laterality: N/A;  . Cardioversion  08/25/2011    Procedure: CARDIOVERSION;  Surgeon: Jettie Booze, MD;  Location: [place];  Service: Cardiovascular;  Laterality: N/A;  . Tee without cardioversion  11/09/2011    Procedure: TRANSESOPHAGEAL ECHOCARDIOGRAM (TEE);  Surgeon: Jettie Booze, MD;  Location[facility] ENDOSCOPY;  Service: Cardiovascular;  Laterality: N/A;  h/p in file drawer  . Atrial fibrillation ablation  12/06/11; 02/05/2013    Afib and atrial flutter ablation by Dr Rayann Heman; repeat PVI by Dr Rayann Heman 02/05/2013  . Tee without  cardioversion N/A 02/05/2013    Procedure: TRANSESOPHAGEAL ECHOCARDIOGRAM (TEE);  Surgeon: Candee Furbish, MD;  Location: [facility] ENDOSCOPY;  Service: Cardiovascular;  Laterality: N/A;  . Cardioversion N/A 03/05/2013    Procedure: CARDIOVERSION;  Surgeon: Sinclair Grooms, MD;  Location: [place];  Service: Cardiovascular;  Laterality: N/A;  BEDSIDE   . Esophagogastroduodenoscopy N/A 04/09/2013    Procedure: ESOPHAGOGASTRODUODENOSCOPY (EGD) with possible Balloon Dilation;  Surgeon: Garlan Fair, MD;  Location: [facility] ENDOSCOPY;  Service: Endoscopy;  Laterality: N/A;  . Atrial fibrillation ablation N/A 12/06/2011    Procedure: ATRIAL FIBRILLATION ABLATION;  Surgeon: Thompson Grayer, MD;  Location: [facility] CATH LAB;  Service: Cardiovascular;  Laterality: N/A;  . Atrial fibrillation ablation N/A 02/05/2013    Procedure: ATRIAL FIBRILLATION ABLATION;  Surgeon: Coralyn Mark, MD;  Location: [place] CATH LAB;  Service: Cardiovascular;  Laterality: N/A;  . Electrophysiologic study N/A 11/24/2014    Procedure: Cardioversion;  Surgeon: Will Meredith Leeds, MD;  Location: [place] CV LAB;  Service: Cardiovascular;  Laterality: N/A;    Current Outpatient Prescriptions  Medication Sig Dispense Refill  . albuterol (PROVENTIL) (2.5 MG/3ML) 0.083% nebulizer solution Take 2.5 mg by nebulization 2 (two) times daily as needed for wheezing or shortness of breath.     . Canagliflozin (INVOKANA) 300 MG TABS Take 1 tablet by mouth daily.    . carvedilol (COREG) 12.5 MG tablet Take 1.5 tablets (18.75 mg total) by mouth 2 (two) times daily. 270 tablet 3  . Cholecalciferol (VITAMIN D) 2000 UNITS CAPS Take 2,000 Units by mouth daily.    Marland Kitchen diltiazem (CARDIZEM) 30 MG tablet Take 1 tablet (30 mg total) by mouth as needed (Every 4 hours AS NEEDED with a HR 123XX123 and Systolic 123XX123 (BP)). 30 tablet 3  . dronedarone (MULTAQ) 400 MG tablet Take 1 tablet (400 mg total) by mouth 2 (two) times daily with a meal. 180 tablet 3  . ferrous sulfate 325 (65 FE) MG tablet Take 1 tablet (325 mg total) by mouth daily with breakfast. 90 tablet 1  . GLUCOSAMINE PO Take 1,000 mg by mouth daily.     Marland Kitchen imipramine (TOFRANIL) 25 MG tablet Take 50 mg by mouth at bedtime.     Marland Kitchen levothyroxine (SYNTHROID, LEVOTHROID) 200 MCG tablet Take 200 mcg by mouth daily.    . Multiple Vitamins-Minerals (MENS 50+ MULTI VITAMIN/MIN PO) Take 1 tablet by mouth daily.    . pantoprazole (PROTONIX) 40 MG tablet Take 40 mg by mouth daily.     . polycarbophil (FIBERCON) 625 MG tablet Take 1,250 mg by mouth daily.    . potassium chloride SA (K-DUR,KLOR-CON) 20 MEQ tablet Take 2 tablets (40 mEq total) by mouth daily. 180 tablet 1  . pravastatin  (PRAVACHOL) 20 MG tablet Take 10 mg by mouth at bedtime.     . Rivaroxaban (XARELTO) 15 MG TABS tablet Take 1 tablet (15 mg total) by mouth every evening. 90 tablet 0   No current facility-administered medications for this encounter.    Allergies  Allergen Reactions  . Phenergan [Promethazine Hcl] Itching    Hallucination   . Aspirin     Hx of ulcer   . Bystolic [Nebivolol Hcl] Swelling    Bradycardia   . Calcium Channel Blockers Swelling    LE edema  . Prednisone     Hx of ulcer  . Nsaids     Hx ulcer    Social History   Social History  . Marital Status: Married  Spouse Name: N/A  . Number of Children: N/A  . Years of Education: N/A   Occupational History  . Not on file.   Social History Main Topics  . Smoking status: Never Smoker   . Smokeless tobacco: Not on file  . Alcohol Use: No  . Drug Use: No  . Sexual Activity: Yes   Other Topics Concern  . Not on file   Social History Narrative   Lives in [place].  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 Emphysema Mother   . Lung cancer Father   . Diabetes Brother   . Colon cancer Neg Hx     ROS- All systems are reviewed and negative except as per the HPI above  Physical Exam: Filed Vitals:   04/21/15 1544  BP: 146/92  Pulse: 68  Height: 5\' 7"  (1.702 m)  Weight: 236 lb 3.2 oz (107.14 kg)    GEN- The patient is well appearing, alert and oriented x 3 today.   Head- normocephalic, atraumatic Eyes-  Sclera clear, conjunctiva pink Ears- hearing intact Oropharynx- clear Neck- supple, no JVP Lymph- no cervical lymphadenopathy Lungs- Clear to ausculation bilaterally, normal work of breathing Heart- Regular rate and rhythm, no murmurs, rubs or gallops, PMI not laterally displaced GI- soft, NT, ND, + BS Extremities- no clubbing, cyanosis, or edema MS- no significant deformity or atrophy Skin- no rash or lesion Psych- euthymic mood, full  affect Neuro- strength and sensation are intact  EKG-NSR, normal EKG, v rate 68 bpm, pr int 184 ms, qrs int 98 ms, qtc 460ms Epic records reviewed  Assessment and Plan: 1. Persistent afib Maintaining SR  since convergent afib abaltion 02/26/15 at [facility] Pt plans to stop multag when current bottle is finished Continue carvedilol  2. HTN Acceptable Has been instructed by [facility] to take an extra 12.5 mg carvedilol for spikes in BP Avoid salt intake  F/u as per plans at [place] as needed  Butch Penny C. Leeba Barbe, [facility] [address] [place], [place] [address] [phone]

## 2015-05-22 ENCOUNTER — Other Ambulatory Visit: Payer: Self-pay | Admitting: Internal Medicine

## 2015-05-22 MED ORDER — RIVAROXABAN 15 MG PO TABS: 15.0000 mg | ORAL_TABLET | Freq: Every evening | ORAL | Status: DC

## 2015-05-22 MED ORDER — CARVEDILOL 12.5 MG PO TABS: 18.7500 mg | ORAL_TABLET | Freq: Two times a day (BID) | ORAL | Status: DC

## 2015-05-23 ENCOUNTER — Encounter (HOSPITAL_COMMUNITY): Payer: Self-pay | Admitting: Emergency Medicine

## 2015-05-23 ENCOUNTER — Emergency Department (HOSPITAL_COMMUNITY)
Admission: EM | Admit: 2015-05-23 | Discharge: 2015-05-23 | Disposition: A | Discharge status: Home / Self Care | Payer: Managed Care, Other (non HMO) | Source: Home / Self Care | Attending: Family Medicine | Admitting: Family Medicine

## 2015-05-23 DIAGNOSIS — J111 Influenza due to unidentified influenza virus with other respiratory manifestations: Secondary | ICD-10-CM

## 2015-05-23 MED ORDER — OSELTAMIVIR PHOSPHATE 75 MG PO CAPS: 75.0000 mg | ORAL_CAPSULE | Freq: Two times a day (BID) | ORAL | Status: DC

## 2015-05-23 MED ORDER — KETOROLAC TROMETHAMINE 30 MG/ML IJ SOLN
30.0000 mg | Freq: Once | INTRAMUSCULAR | Status: AC
  Administered 2015-05-23: 30 mg via INTRAMUSCULAR

## 2015-05-23 MED ORDER — KETOROLAC TROMETHAMINE 30 MG/ML IJ SOLN
INTRAMUSCULAR | Status: AC
  Filled 2015-05-23: qty 1

## 2015-05-23 NOTE — Discharge Instructions (Signed)
It is a pleasure to see you today.  Your presentation is consistent with a very early presentation of the flu.  For this reason, I am treating you with TAMIFLU 75mg  by mouth, twice daily for five days.  This is expected to shorten the course of your illness.   Tylenol 650mg  to 1000mg  by mouth every 6 hours as needed for pain and fevers.  You are receiving an injection of TORADOL 30mg  in the urgent care center today.   Follow up with your primary doctor, Dr. Philip Aspen, early next week. Return to the urgent care center or the Emergency department if you have worsening, or if you develop new symptoms such as shortness of breath, diarrhea/vomiting, inability to keep down liquids, abdominal pain, or other symptoms or concerns.

## 2015-05-23 NOTE — ED Provider Notes (Signed)
CSN: [id number]     Arrival date & time 05/23/15  1710 History   First MD Initiated Contact with Patient 05/23/15 1737     Chief Complaint  Patient presents with  . Influenza   (Consider location/radiation/quality/duration/timing/severity/associated sxs/prior Treatment) Patient is a 65 y.o. male presenting with flu symptoms. The history is provided by the patient and the spouse. No language interpreter was used.  Influenza Presenting symptoms: fatigue and fever   Presenting symptoms: no cough, no diarrhea, no nausea, no rhinorrhea and no vomiting   Associated symptoms: chills   Associated symptoms: no ear pain and no congestion   Patient presents with acute onset (11am today) generalized malaise and body aches, headache and fevers.  Temp 102.1F at presentation.  Took Tylenol earlier today, did not help much.  Denies sick contact, no cough or shortness of breath.  No N/V or Diarrhea.  No chest pain.  No dysuria or changes in urinary patterns or appearance.  Wife is not ill.  Patient received the flu shot earlier this year. In past years he has received flu shot and ended up getting the flu anyway.  PMHx; history AFib, s/p ablation procedure in [place] in November 2016. Had post-procedure wound infection in central chest, treated with Keflex and has healed.   Past Medical History  Diagnosis Date  . Hypertension   . PAF (paroxysmal atrial fibrillation) ([place])     a. failed DCCV and multiple anti-arrhythmic drugs (multaq/flecainide);   b. s/p  PVI isolation with ablation of AFib 8/13; c. repeat PVI 01/2013;  d. 02/2013 repeat DCCV->Amio load/xarelto.  . Renal calculi   . Chronic combined systolic and diastolic CHF (congestive heart failure) ([place])     a. LVEF previously 35% felt to be due tachycardia;  b. 02/2013 Echo: EF 50-55%, no rwma, mildly dil LA/RA, mild to mod MR.  . Diabetes mellitus ([place])   . Obesities, morbid ([place])   . Hypothyroid   . Gastric ulcer     s/p prior surgery  .  Diverticulosis   . Erectile dysfunction   . Obstructive sleep apnea     noncompliant with CPAP  . Atrial flutter ([place])     a. s/p RFCA 8/13  . CKD (chronic kidney disease), stage III     hx of a/c renal failure during episode of diverticulitis 9/13 in [place], [place]  . Anemia    Past Surgical History  Procedure Laterality Date  . Wrist sx  1980    right  . Knee sx  1985    both  . Benign stomach tumor removal  2000  . Thyroidectomy  2009    cysts removal   . Laparoscopic retropubic prostatectomy  02/2007    hx prostate cancer  . Kidney stone removal      multiple  . Tee without cardioversion  08/25/2011    Procedure: TRANSESOPHAGEAL ECHOCARDIOGRAM (TEE);  Surgeon: Jettie Booze, MD;  Location: [place];  Service: Cardiovascular;  Laterality: N/A;  . Cardioversion  08/25/2011    Procedure: CARDIOVERSION;  Surgeon: Jettie Booze, MD;  Location: [place];  Service: Cardiovascular;  Laterality: N/A;  . Tee without cardioversion  11/09/2011    Procedure: TRANSESOPHAGEAL ECHOCARDIOGRAM (TEE);  Surgeon: Jettie Booze, MD;  Location: [facility] ENDOSCOPY;  Service: Cardiovascular;  Laterality: N/A;  h/p in file drawer  . Atrial fibrillation ablation  12/06/11; 02/05/2013    Afib and atrial flutter ablation by Dr Rayann Heman; repeat PVI by Dr Rayann Heman 02/05/2013  . [place] Dates  without cardioversion N/A 02/05/2013    Procedure: TRANSESOPHAGEAL ECHOCARDIOGRAM (TEE);  Surgeon: Candee Furbish, MD;  Location: [facility] ENDOSCOPY;  Service: Cardiovascular;  Laterality: N/A;  . Cardioversion N/A 03/05/2013    Procedure: CARDIOVERSION;  Surgeon: Sinclair Grooms, MD;  Location: [place];  Service: Cardiovascular;  Laterality: N/A;  BEDSIDE   . Esophagogastroduodenoscopy N/A 04/09/2013    Procedure: ESOPHAGOGASTRODUODENOSCOPY (EGD) with possible Balloon Dilation;  Surgeon: Garlan Fair, MD;  Location: [facility] ENDOSCOPY;  Service: Endoscopy;  Laterality: N/A;  . Atrial fibrillation ablation N/A 12/06/2011     Procedure: ATRIAL FIBRILLATION ABLATION;  Surgeon: Thompson Grayer, MD;  Location: [facility] CATH LAB;  Service: Cardiovascular;  Laterality: N/A;  . Atrial fibrillation ablation N/A 02/05/2013    Procedure: ATRIAL FIBRILLATION ABLATION;  Surgeon: Coralyn Mark, MD;  Location: [place] CATH LAB;  Service: Cardiovascular;  Laterality: N/A;  . Electrophysiologic study N/A 11/24/2014    Procedure: Cardioversion;  Surgeon: Will Meredith Leeds, MD;  Location: [place] CV LAB;  Service: Cardiovascular;  Laterality: N/A;  . Convergent afib abaltion [facility] 02/26/15  02/26/15    [facility] by Dr. Lehman Prom and Dr. Danise Mina   Family History  Problem Relation Age of Onset  . Emphysema Mother   . Lung cancer Father   . Diabetes Brother   . Colon cancer Neg Hx    Social History  Substance Use Topics  . Smoking status: Never Smoker   . Smokeless tobacco: None  . Alcohol Use: No    Review of Systems  Constitutional: Positive for fever, chills, diaphoresis and fatigue.  HENT: Negative for congestion, ear pain, postnasal drip, rhinorrhea, sinus pressure, sneezing and trouble swallowing.   Respiratory: Negative for cough and wheezing.   Cardiovascular: Negative for chest pain.  Gastrointestinal: Negative for nausea, vomiting, abdominal pain, diarrhea, constipation and abdominal distention.  All other systems reviewed and are negative.   Allergies  Phenergan; Aspirin; Bystolic; Calcium channel blockers; Prednisone; and Nsaids  Home Medications   Prior to Admission medications   Medication Sig Start Date End Date Taking? Authorizing Provider  albuterol (PROVENTIL) (2.5 MG/3ML) 0.083% nebulizer solution Take 2.5 mg by nebulization 2 (two) times daily as needed for wheezing or shortness of breath.  05/22/13  Yes Historical Provider, MD  Canagliflozin (INVOKANA) 300 MG TABS Take 1 tablet by mouth daily.   Yes Historical Provider, MD  carvedilol (COREG) 12.5 MG tablet Take 1.5 tablets (18.75 mg total) by mouth 2 (two) times  daily. 05/22/15  Yes Thompson Grayer, MD  Cholecalciferol (VITAMIN D) 2000 UNITS CAPS Take 2,000 Units by mouth daily.   Yes Historical Provider, MD  diltiazem (CARDIZEM) 30 MG tablet Take 1 tablet (30 mg total) by mouth as needed (Every 4 hours AS NEEDED with a HR 123XX123 and Systolic 123XX123 (BP)). [id number]  Yes Sherran Needs, NP  dronedarone (MULTAQ) 400 MG tablet Take 1 tablet (400 mg total) by mouth 2 (two) times daily with a meal. 02/23/15  Yes Sherran Needs, NP  ferrous sulfate 325 (65 FE) MG tablet Take 1 tablet (325 mg total) by mouth daily with breakfast. 11/24/14  Yes Doreene Burke Kilroy, PA-C  GLUCOSAMINE PO Take 1,000 mg by mouth daily.    Yes Historical Provider, MD  imipramine (TOFRANIL) 25 MG tablet Take 50 mg by mouth at bedtime.    Yes Historical Provider, MD  levothyroxine (SYNTHROID, LEVOTHROID) 200 MCG tablet Take 200 mcg by mouth daily.   Yes Historical Provider, MD  Multiple Vitamins-Minerals (MENS 50+ [place]  VITAMIN/MIN PO) Take 1 tablet by mouth daily.   Yes Historical Provider, MD  pantoprazole (PROTONIX) 40 MG tablet Take 40 mg by mouth daily.    Yes Historical Provider, MD  polycarbophil (FIBERCON) 625 MG tablet Take 1,250 mg by mouth daily.   Yes Historical Provider, MD  potassium chloride SA (K-DUR,KLOR-CON) 20 MEQ tablet Take 2 tablets (40 mEq total) by mouth daily. 11/24/14  Yes Luke K Kilroy, PA-C  pravastatin (PRAVACHOL) 20 MG tablet Take 10 mg by mouth at bedtime.    Yes Historical Provider, MD  Rivaroxaban (XARELTO) 15 MG TABS tablet Take 1 tablet (15 mg total) by mouth every evening. 05/22/15  Yes Thompson Grayer, MD   Meds Ordered and Administered this Visit   Medications  ketorolac (TORADOL) 30 MG/ML injection 30 mg (not administered)    BP 136/84 mmHg  Pulse 108  Temp(Src) 102.5 F (39.2 C) (Oral)  SpO2 99% No data found.   Physical Exam  Constitutional: He appears well-developed and well-nourished.  Alert and able to give history, in mild distress from body aches.    HENT:  Head: Normocephalic and atraumatic.  Right Ear: External ear normal.  Left Ear: External ear normal.  Nose: Nose normal.  Mouth/Throat: Oropharynx is clear and moist. No oropharyngeal exudate.  Eyes: Conjunctivae and EOM are normal. Pupils are equal, round, and reactive to light. Right eye exhibits no discharge. Left eye exhibits no discharge.  Neck: Normal range of motion. Neck supple.  Cardiovascular: Normal rate, regular rhythm and normal heart sounds.   Chest wall with healed central incision that is dry and intact, without surrounding redness. Wife shows me a picture on phone of wound infection at earlier presentation.   Pulmonary/Chest: Effort normal and breath sounds normal. No respiratory distress. He has no wheezes. He has no rales. He exhibits no tenderness.  Abdominal: Soft. He exhibits no distension and no mass. There is no tenderness. There is no rebound and no guarding.  Lymphadenopathy:    He has no cervical adenopathy.  Skin: He is diaphoretic.    ED Course  Procedures (including critical care time)  Labs Review Labs Reviewed - No data to display  Imaging Review No results found.   Visual Acuity Review  Right Eye Distance:   Left Eye Distance:   Bilateral Distance:    Right Eye Near:   Left Eye Near:    Bilateral Near:         MDM  No diagnosis found. Patient with ILI, no other clear explanation for presenting symptoms.  Sudden onset body aches and fevers, without apparent GI or GU sxs.  Plan to treat with Tamiflu, Tylenol at home. His intolerance to NSAIDs and aspirin is GI intolerance.  Reviewed Care Everywhere and [facility] on 05/11/15 had GFR = 56, BUN=17, Cr 1.29.  Toradol 30mg  IM today; given strict instructions for follow up in ED if worsening, if dyspnea or other worsening, and to see his primary doctor on Monday for follow up.   Dalbert Mayotte, MD    Willeen Niece, MD 05/23/15 [phone]

## 2015-05-23 NOTE — ED Notes (Signed)
Patient c/o flu like sx and fever onset today around noon. Patient is wheezing and asked to lay down on exam. Wife reports he's had Tylenol with no relief.

## 2015-05-24 ENCOUNTER — Telehealth (HOSPITAL_COMMUNITY): Payer: Self-pay | Admitting: Family Medicine

## 2015-05-24 NOTE — Telephone Encounter (Signed)
I placed a follow up call to inquire about patient welfare. Called mobile number listed, left voice message. Patient seen by me yesterday in [facility] for what appeared ILI.    Dalbert Mayotte, MD

## 2015-05-24 NOTE — Telephone Encounter (Signed)
Called patient at his mobile phone, reports that his fevers and aches are improved, now has rhinorrhea and headache but is overall feeling better. Is taking Tamiflu.  Discussed follow up plans with his primary physician, as well as indications for ED follow up (which is not indicated at this time).   [name] Mayotte, MD

## 2015-06-19 ENCOUNTER — Telehealth (HOSPITAL_COMMUNITY): Payer: Self-pay | Admitting: Nurse Practitioner

## 2015-06-19 DIAGNOSIS — I480 Paroxysmal atrial fibrillation: Secondary | ICD-10-CM | POA: Diagnosis not present

## 2015-06-19 DIAGNOSIS — Z09 Encounter for follow-up examination after completed treatment for conditions other than malignant neoplasm: Secondary | ICD-10-CM | POA: Diagnosis not present

## 2015-06-19 DIAGNOSIS — I10 Essential (primary) hypertension: Secondary | ICD-10-CM | POA: Diagnosis not present

## 2015-06-19 DIAGNOSIS — L928 Other granulomatous disorders of the skin and subcutaneous tissue: Secondary | ICD-10-CM | POA: Diagnosis not present

## 2015-06-19 NOTE — Telephone Encounter (Signed)
Pt wife wanted to know Dr. Jackalyn Lombard thoughts re staying on blood thinner now that he has undergone convergent procedure and is staying in SR. He has a chadsvasc score of 3 and per guidelines and Dr. Jackalyn Lombard recommendations, he should stay on blood thinner long term. However, he had a chest tube during his procedure and has had some chronic watery blood oozing and the surgeon suggested that he come off blood thinner for the area to heal and then resume blood thinner.Marland Kitchen

## 2015-07-07 DIAGNOSIS — R918 Other nonspecific abnormal finding of lung field: Secondary | ICD-10-CM | POA: Diagnosis not present

## 2015-07-07 DIAGNOSIS — L0889 Other specified local infections of the skin and subcutaneous tissue: Secondary | ICD-10-CM | POA: Diagnosis not present

## 2015-07-08 DIAGNOSIS — I5032 Chronic diastolic (congestive) heart failure: Secondary | ICD-10-CM | POA: Diagnosis present

## 2015-07-08 DIAGNOSIS — L0889 Other specified local infections of the skin and subcutaneous tissue: Secondary | ICD-10-CM | POA: Diagnosis not present

## 2015-07-08 DIAGNOSIS — N183 Chronic kidney disease, stage 3 (moderate): Secondary | ICD-10-CM | POA: Diagnosis present

## 2015-07-08 DIAGNOSIS — E1122 Type 2 diabetes mellitus with diabetic chronic kidney disease: Secondary | ICD-10-CM | POA: Diagnosis present

## 2015-07-08 DIAGNOSIS — I130 Hypertensive heart and chronic kidney disease with heart failure and stage 1 through stage 4 chronic kidney disease, or unspecified chronic kidney disease: Secondary | ICD-10-CM | POA: Diagnosis present

## 2015-07-08 DIAGNOSIS — G4733 Obstructive sleep apnea (adult) (pediatric): Secondary | ICD-10-CM | POA: Diagnosis present

## 2015-07-08 DIAGNOSIS — B9620 Unspecified Escherichia coli [E. coli] as the cause of diseases classified elsewhere: Secondary | ICD-10-CM | POA: Diagnosis present

## 2015-07-08 DIAGNOSIS — I480 Paroxysmal atrial fibrillation: Secondary | ICD-10-CM | POA: Diagnosis present

## 2015-07-08 DIAGNOSIS — Z8679 Personal history of other diseases of the circulatory system: Secondary | ICD-10-CM | POA: Diagnosis not present

## 2015-07-08 DIAGNOSIS — T814XXA Infection following a procedure, initial encounter: Secondary | ICD-10-CM | POA: Diagnosis not present

## 2015-07-08 DIAGNOSIS — E785 Hyperlipidemia, unspecified: Secondary | ICD-10-CM | POA: Diagnosis present

## 2015-07-08 DIAGNOSIS — L02213 Cutaneous abscess of chest wall: Secondary | ICD-10-CM | POA: Diagnosis present

## 2015-07-08 DIAGNOSIS — E039 Hypothyroidism, unspecified: Secondary | ICD-10-CM | POA: Diagnosis present

## 2015-07-13 DIAGNOSIS — I130 Hypertensive heart and chronic kidney disease with heart failure and stage 1 through stage 4 chronic kidney disease, or unspecified chronic kidney disease: Secondary | ICD-10-CM | POA: Diagnosis not present

## 2015-07-13 DIAGNOSIS — T814XXD Infection following a procedure, subsequent encounter: Secondary | ICD-10-CM | POA: Diagnosis not present

## 2015-07-13 DIAGNOSIS — E1122 Type 2 diabetes mellitus with diabetic chronic kidney disease: Secondary | ICD-10-CM | POA: Diagnosis not present

## 2015-07-13 DIAGNOSIS — I480 Paroxysmal atrial fibrillation: Secondary | ICD-10-CM | POA: Diagnosis not present

## 2015-07-13 DIAGNOSIS — L02213 Cutaneous abscess of chest wall: Secondary | ICD-10-CM | POA: Diagnosis not present

## 2015-07-13 DIAGNOSIS — I5030 Unspecified diastolic (congestive) heart failure: Secondary | ICD-10-CM | POA: Diagnosis not present

## 2015-07-15 DIAGNOSIS — I5030 Unspecified diastolic (congestive) heart failure: Secondary | ICD-10-CM | POA: Diagnosis not present

## 2015-07-15 DIAGNOSIS — T814XXD Infection following a procedure, subsequent encounter: Secondary | ICD-10-CM | POA: Diagnosis not present

## 2015-07-15 DIAGNOSIS — I480 Paroxysmal atrial fibrillation: Secondary | ICD-10-CM | POA: Diagnosis not present

## 2015-07-15 DIAGNOSIS — E1122 Type 2 diabetes mellitus with diabetic chronic kidney disease: Secondary | ICD-10-CM | POA: Diagnosis not present

## 2015-07-15 DIAGNOSIS — I130 Hypertensive heart and chronic kidney disease with heart failure and stage 1 through stage 4 chronic kidney disease, or unspecified chronic kidney disease: Secondary | ICD-10-CM | POA: Diagnosis not present

## 2015-07-15 DIAGNOSIS — L02213 Cutaneous abscess of chest wall: Secondary | ICD-10-CM | POA: Diagnosis not present

## 2015-07-17 DIAGNOSIS — I480 Paroxysmal atrial fibrillation: Secondary | ICD-10-CM | POA: Diagnosis not present

## 2015-07-17 DIAGNOSIS — E1122 Type 2 diabetes mellitus with diabetic chronic kidney disease: Secondary | ICD-10-CM | POA: Diagnosis not present

## 2015-07-17 DIAGNOSIS — L02213 Cutaneous abscess of chest wall: Secondary | ICD-10-CM | POA: Diagnosis not present

## 2015-07-17 DIAGNOSIS — I130 Hypertensive heart and chronic kidney disease with heart failure and stage 1 through stage 4 chronic kidney disease, or unspecified chronic kidney disease: Secondary | ICD-10-CM | POA: Diagnosis not present

## 2015-07-17 DIAGNOSIS — T814XXD Infection following a procedure, subsequent encounter: Secondary | ICD-10-CM | POA: Diagnosis not present

## 2015-07-17 DIAGNOSIS — I5030 Unspecified diastolic (congestive) heart failure: Secondary | ICD-10-CM | POA: Diagnosis not present

## 2015-07-20 DIAGNOSIS — T814XXD Infection following a procedure, subsequent encounter: Secondary | ICD-10-CM | POA: Diagnosis not present

## 2015-07-20 DIAGNOSIS — I480 Paroxysmal atrial fibrillation: Secondary | ICD-10-CM | POA: Diagnosis not present

## 2015-07-20 DIAGNOSIS — I5030 Unspecified diastolic (congestive) heart failure: Secondary | ICD-10-CM | POA: Diagnosis not present

## 2015-07-20 DIAGNOSIS — I130 Hypertensive heart and chronic kidney disease with heart failure and stage 1 through stage 4 chronic kidney disease, or unspecified chronic kidney disease: Secondary | ICD-10-CM | POA: Diagnosis not present

## 2015-07-20 DIAGNOSIS — E1122 Type 2 diabetes mellitus with diabetic chronic kidney disease: Secondary | ICD-10-CM | POA: Diagnosis not present

## 2015-07-20 DIAGNOSIS — L02213 Cutaneous abscess of chest wall: Secondary | ICD-10-CM | POA: Diagnosis not present

## 2015-07-21 ENCOUNTER — Encounter (HOSPITAL_COMMUNITY): Payer: Self-pay | Admitting: Nurse Practitioner

## 2015-07-21 DIAGNOSIS — Z9889 Other specified postprocedural states: Secondary | ICD-10-CM | POA: Diagnosis not present

## 2015-07-21 DIAGNOSIS — Z8679 Personal history of other diseases of the circulatory system: Secondary | ICD-10-CM | POA: Diagnosis not present

## 2015-07-21 DIAGNOSIS — T814XXD Infection following a procedure, subsequent encounter: Secondary | ICD-10-CM | POA: Diagnosis not present

## 2015-07-21 DIAGNOSIS — Z09 Encounter for follow-up examination after completed treatment for conditions other than malignant neoplasm: Secondary | ICD-10-CM | POA: Diagnosis not present

## 2015-07-22 DIAGNOSIS — I480 Paroxysmal atrial fibrillation: Secondary | ICD-10-CM | POA: Diagnosis not present

## 2015-07-22 DIAGNOSIS — T814XXD Infection following a procedure, subsequent encounter: Secondary | ICD-10-CM | POA: Diagnosis not present

## 2015-07-22 DIAGNOSIS — L02213 Cutaneous abscess of chest wall: Secondary | ICD-10-CM | POA: Diagnosis not present

## 2015-07-22 DIAGNOSIS — I130 Hypertensive heart and chronic kidney disease with heart failure and stage 1 through stage 4 chronic kidney disease, or unspecified chronic kidney disease: Secondary | ICD-10-CM | POA: Diagnosis not present

## 2015-07-22 DIAGNOSIS — E1122 Type 2 diabetes mellitus with diabetic chronic kidney disease: Secondary | ICD-10-CM | POA: Diagnosis not present

## 2015-07-22 DIAGNOSIS — I5030 Unspecified diastolic (congestive) heart failure: Secondary | ICD-10-CM | POA: Diagnosis not present

## 2015-07-24 DIAGNOSIS — I130 Hypertensive heart and chronic kidney disease with heart failure and stage 1 through stage 4 chronic kidney disease, or unspecified chronic kidney disease: Secondary | ICD-10-CM | POA: Diagnosis not present

## 2015-07-24 DIAGNOSIS — E1122 Type 2 diabetes mellitus with diabetic chronic kidney disease: Secondary | ICD-10-CM | POA: Diagnosis not present

## 2015-07-24 DIAGNOSIS — I5030 Unspecified diastolic (congestive) heart failure: Secondary | ICD-10-CM | POA: Diagnosis not present

## 2015-07-24 DIAGNOSIS — I480 Paroxysmal atrial fibrillation: Secondary | ICD-10-CM | POA: Diagnosis not present

## 2015-07-24 DIAGNOSIS — T814XXD Infection following a procedure, subsequent encounter: Secondary | ICD-10-CM | POA: Diagnosis not present

## 2015-07-24 DIAGNOSIS — L02213 Cutaneous abscess of chest wall: Secondary | ICD-10-CM | POA: Diagnosis not present

## 2015-07-27 DIAGNOSIS — I480 Paroxysmal atrial fibrillation: Secondary | ICD-10-CM | POA: Diagnosis not present

## 2015-07-27 DIAGNOSIS — I5030 Unspecified diastolic (congestive) heart failure: Secondary | ICD-10-CM | POA: Diagnosis not present

## 2015-07-27 DIAGNOSIS — T814XXD Infection following a procedure, subsequent encounter: Secondary | ICD-10-CM | POA: Diagnosis not present

## 2015-07-27 DIAGNOSIS — I130 Hypertensive heart and chronic kidney disease with heart failure and stage 1 through stage 4 chronic kidney disease, or unspecified chronic kidney disease: Secondary | ICD-10-CM | POA: Diagnosis not present

## 2015-07-27 DIAGNOSIS — L02213 Cutaneous abscess of chest wall: Secondary | ICD-10-CM | POA: Diagnosis not present

## 2015-07-27 DIAGNOSIS — E1122 Type 2 diabetes mellitus with diabetic chronic kidney disease: Secondary | ICD-10-CM | POA: Diagnosis not present

## 2015-07-29 DIAGNOSIS — Z9889 Other specified postprocedural states: Secondary | ICD-10-CM | POA: Diagnosis not present

## 2015-07-29 DIAGNOSIS — Z09 Encounter for follow-up examination after completed treatment for conditions other than malignant neoplasm: Secondary | ICD-10-CM | POA: Diagnosis not present

## 2015-07-29 DIAGNOSIS — Z8679 Personal history of other diseases of the circulatory system: Secondary | ICD-10-CM | POA: Diagnosis not present

## 2015-08-03 DIAGNOSIS — I130 Hypertensive heart and chronic kidney disease with heart failure and stage 1 through stage 4 chronic kidney disease, or unspecified chronic kidney disease: Secondary | ICD-10-CM | POA: Diagnosis not present

## 2015-08-03 DIAGNOSIS — L02213 Cutaneous abscess of chest wall: Secondary | ICD-10-CM | POA: Diagnosis not present

## 2015-08-03 DIAGNOSIS — I480 Paroxysmal atrial fibrillation: Secondary | ICD-10-CM | POA: Diagnosis not present

## 2015-08-03 DIAGNOSIS — E1122 Type 2 diabetes mellitus with diabetic chronic kidney disease: Secondary | ICD-10-CM | POA: Diagnosis not present

## 2015-08-03 DIAGNOSIS — I5030 Unspecified diastolic (congestive) heart failure: Secondary | ICD-10-CM | POA: Diagnosis not present

## 2015-08-03 DIAGNOSIS — T814XXD Infection following a procedure, subsequent encounter: Secondary | ICD-10-CM | POA: Diagnosis not present

## 2015-08-07 DIAGNOSIS — I480 Paroxysmal atrial fibrillation: Secondary | ICD-10-CM | POA: Diagnosis not present

## 2015-08-07 DIAGNOSIS — I130 Hypertensive heart and chronic kidney disease with heart failure and stage 1 through stage 4 chronic kidney disease, or unspecified chronic kidney disease: Secondary | ICD-10-CM | POA: Diagnosis not present

## 2015-08-07 DIAGNOSIS — L02213 Cutaneous abscess of chest wall: Secondary | ICD-10-CM | POA: Diagnosis not present

## 2015-08-07 DIAGNOSIS — E1122 Type 2 diabetes mellitus with diabetic chronic kidney disease: Secondary | ICD-10-CM | POA: Diagnosis not present

## 2015-08-07 DIAGNOSIS — I5030 Unspecified diastolic (congestive) heart failure: Secondary | ICD-10-CM | POA: Diagnosis not present

## 2015-08-07 DIAGNOSIS — T814XXD Infection following a procedure, subsequent encounter: Secondary | ICD-10-CM | POA: Diagnosis not present

## 2015-08-10 ENCOUNTER — Encounter: Payer: Self-pay | Admitting: Interventional Cardiology

## 2015-08-10 DIAGNOSIS — M1811 Unilateral primary osteoarthritis of first carpometacarpal joint, right hand: Secondary | ICD-10-CM | POA: Diagnosis not present

## 2015-08-10 DIAGNOSIS — Z125 Encounter for screening for malignant neoplasm of prostate: Secondary | ICD-10-CM | POA: Diagnosis not present

## 2015-08-10 DIAGNOSIS — E782 Mixed hyperlipidemia: Secondary | ICD-10-CM | POA: Diagnosis not present

## 2015-08-10 DIAGNOSIS — E890 Postprocedural hypothyroidism: Secondary | ICD-10-CM | POA: Diagnosis not present

## 2015-08-10 DIAGNOSIS — E1129 Type 2 diabetes mellitus with other diabetic kidney complication: Secondary | ICD-10-CM | POA: Diagnosis not present

## 2015-08-10 DIAGNOSIS — M19031 Primary osteoarthritis, right wrist: Secondary | ICD-10-CM | POA: Diagnosis not present

## 2015-08-10 DIAGNOSIS — R8299 Other abnormal findings in urine: Secondary | ICD-10-CM | POA: Diagnosis not present

## 2015-08-10 DIAGNOSIS — N182 Chronic kidney disease, stage 2 (mild): Secondary | ICD-10-CM | POA: Diagnosis not present

## 2015-08-13 DIAGNOSIS — I130 Hypertensive heart and chronic kidney disease with heart failure and stage 1 through stage 4 chronic kidney disease, or unspecified chronic kidney disease: Secondary | ICD-10-CM | POA: Diagnosis not present

## 2015-08-13 DIAGNOSIS — I5030 Unspecified diastolic (congestive) heart failure: Secondary | ICD-10-CM | POA: Diagnosis not present

## 2015-08-13 DIAGNOSIS — I480 Paroxysmal atrial fibrillation: Secondary | ICD-10-CM | POA: Diagnosis not present

## 2015-08-13 DIAGNOSIS — T814XXD Infection following a procedure, subsequent encounter: Secondary | ICD-10-CM | POA: Diagnosis not present

## 2015-08-13 DIAGNOSIS — L02213 Cutaneous abscess of chest wall: Secondary | ICD-10-CM | POA: Diagnosis not present

## 2015-08-13 DIAGNOSIS — E1122 Type 2 diabetes mellitus with diabetic chronic kidney disease: Secondary | ICD-10-CM | POA: Diagnosis not present

## 2015-08-17 DIAGNOSIS — E782 Mixed hyperlipidemia: Secondary | ICD-10-CM | POA: Diagnosis not present

## 2015-08-17 DIAGNOSIS — Z1389 Encounter for screening for other disorder: Secondary | ICD-10-CM | POA: Diagnosis not present

## 2015-08-17 DIAGNOSIS — I480 Paroxysmal atrial fibrillation: Secondary | ICD-10-CM | POA: Diagnosis not present

## 2015-08-17 DIAGNOSIS — I10 Essential (primary) hypertension: Secondary | ICD-10-CM | POA: Diagnosis not present

## 2015-08-17 DIAGNOSIS — G4733 Obstructive sleep apnea (adult) (pediatric): Secondary | ICD-10-CM | POA: Diagnosis not present

## 2015-08-17 DIAGNOSIS — E890 Postprocedural hypothyroidism: Secondary | ICD-10-CM | POA: Diagnosis not present

## 2015-08-17 DIAGNOSIS — Z6837 Body mass index (BMI) 37.0-37.9, adult: Secondary | ICD-10-CM | POA: Diagnosis not present

## 2015-08-17 DIAGNOSIS — Z0000 Encounter for general adult medical examination without abnormal findings: Secondary | ICD-10-CM | POA: Diagnosis not present

## 2015-08-17 DIAGNOSIS — D508 Other iron deficiency anemias: Secondary | ICD-10-CM | POA: Diagnosis not present

## 2015-08-17 DIAGNOSIS — E1129 Type 2 diabetes mellitus with other diabetic kidney complication: Secondary | ICD-10-CM | POA: Diagnosis not present

## 2015-08-17 DIAGNOSIS — M19031 Primary osteoarthritis, right wrist: Secondary | ICD-10-CM | POA: Diagnosis not present

## 2015-08-17 DIAGNOSIS — Z8546 Personal history of malignant neoplasm of prostate: Secondary | ICD-10-CM | POA: Diagnosis not present

## 2015-08-19 ENCOUNTER — Encounter: Payer: Self-pay | Admitting: Interventional Cardiology

## 2015-08-19 DIAGNOSIS — Z1212 Encounter for screening for malignant neoplasm of rectum: Secondary | ICD-10-CM | POA: Diagnosis not present

## 2015-08-19 LAB — IFOBT (OCCULT BLOOD): IMMUNOLOGICAL FECAL OCCULT BLOOD TEST: POSITIVE

## 2015-08-20 DIAGNOSIS — I5030 Unspecified diastolic (congestive) heart failure: Secondary | ICD-10-CM | POA: Diagnosis not present

## 2015-08-20 DIAGNOSIS — T814XXD Infection following a procedure, subsequent encounter: Secondary | ICD-10-CM | POA: Diagnosis not present

## 2015-08-20 DIAGNOSIS — L02213 Cutaneous abscess of chest wall: Secondary | ICD-10-CM | POA: Diagnosis not present

## 2015-08-20 DIAGNOSIS — I130 Hypertensive heart and chronic kidney disease with heart failure and stage 1 through stage 4 chronic kidney disease, or unspecified chronic kidney disease: Secondary | ICD-10-CM | POA: Diagnosis not present

## 2015-08-20 DIAGNOSIS — I480 Paroxysmal atrial fibrillation: Secondary | ICD-10-CM | POA: Diagnosis not present

## 2015-08-20 DIAGNOSIS — E1122 Type 2 diabetes mellitus with diabetic chronic kidney disease: Secondary | ICD-10-CM | POA: Diagnosis not present

## 2015-09-01 DIAGNOSIS — R591 Generalized enlarged lymph nodes: Secondary | ICD-10-CM | POA: Diagnosis not present

## 2015-09-01 DIAGNOSIS — R599 Enlarged lymph nodes, unspecified: Secondary | ICD-10-CM | POA: Diagnosis not present

## 2015-09-02 DIAGNOSIS — Z0000 Encounter for general adult medical examination without abnormal findings: Secondary | ICD-10-CM | POA: Diagnosis not present

## 2015-09-02 DIAGNOSIS — Z8546 Personal history of malignant neoplasm of prostate: Secondary | ICD-10-CM | POA: Diagnosis not present

## 2015-09-02 DIAGNOSIS — N200 Calculus of kidney: Secondary | ICD-10-CM | POA: Diagnosis not present

## 2015-09-02 DIAGNOSIS — R591 Generalized enlarged lymph nodes: Secondary | ICD-10-CM | POA: Diagnosis not present

## 2015-09-10 DIAGNOSIS — R195 Other fecal abnormalities: Secondary | ICD-10-CM | POA: Diagnosis not present

## 2015-09-21 DIAGNOSIS — M1811 Unilateral primary osteoarthritis of first carpometacarpal joint, right hand: Secondary | ICD-10-CM | POA: Diagnosis not present

## 2015-09-21 DIAGNOSIS — M19031 Primary osteoarthritis, right wrist: Secondary | ICD-10-CM | POA: Diagnosis not present

## 2015-09-28 ENCOUNTER — Encounter: Payer: Self-pay | Admitting: Interventional Cardiology

## 2015-09-28 DIAGNOSIS — E890 Postprocedural hypothyroidism: Secondary | ICD-10-CM | POA: Diagnosis not present

## 2015-09-28 DIAGNOSIS — D508 Other iron deficiency anemias: Secondary | ICD-10-CM | POA: Diagnosis not present

## 2015-09-30 DIAGNOSIS — K921 Melena: Secondary | ICD-10-CM | POA: Diagnosis not present

## 2015-10-10 HISTORY — PX: ORIF ANKLE FRACTURE: SUR919

## 2015-10-22 DIAGNOSIS — I480 Paroxysmal atrial fibrillation: Secondary | ICD-10-CM | POA: Diagnosis not present

## 2015-10-22 DIAGNOSIS — Z8546 Personal history of malignant neoplasm of prostate: Secondary | ICD-10-CM | POA: Diagnosis not present

## 2015-10-22 DIAGNOSIS — R0602 Shortness of breath: Secondary | ICD-10-CM | POA: Diagnosis not present

## 2015-10-22 DIAGNOSIS — R0781 Pleurodynia: Secondary | ICD-10-CM | POA: Diagnosis not present

## 2015-10-22 DIAGNOSIS — Z6836 Body mass index (BMI) 36.0-36.9, adult: Secondary | ICD-10-CM | POA: Diagnosis not present

## 2015-10-28 DIAGNOSIS — K921 Melena: Secondary | ICD-10-CM | POA: Diagnosis not present

## 2015-10-31 ENCOUNTER — Emergency Department (HOSPITAL_COMMUNITY): Payer: Medicare Other

## 2015-10-31 ENCOUNTER — Inpatient Hospital Stay (HOSPITAL_COMMUNITY)
Admission: EM | Admit: 2015-10-31 | Discharge: 2015-11-04 | DRG: 504 | Disposition: A | Discharge status: Home Health | Payer: Medicare Other | Attending: Internal Medicine | Admitting: Internal Medicine

## 2015-10-31 ENCOUNTER — Encounter (HOSPITAL_COMMUNITY): Payer: Self-pay

## 2015-10-31 DIAGNOSIS — S92901A Unspecified fracture of right foot, initial encounter for closed fracture: Secondary | ICD-10-CM | POA: Diagnosis not present

## 2015-10-31 DIAGNOSIS — I10 Essential (primary) hypertension: Secondary | ICD-10-CM | POA: Diagnosis not present

## 2015-10-31 DIAGNOSIS — S93321A Subluxation of tarsometatarsal joint of right foot, initial encounter: Secondary | ICD-10-CM | POA: Diagnosis not present

## 2015-10-31 DIAGNOSIS — W11XXXA Fall on and from ladder, initial encounter: Secondary | ICD-10-CM | POA: Diagnosis present

## 2015-10-31 DIAGNOSIS — Y93H9 Activity, other involving exterior property and land maintenance, building and construction: Secondary | ICD-10-CM | POA: Diagnosis not present

## 2015-10-31 DIAGNOSIS — S92341A Displaced fracture of fourth metatarsal bone, right foot, initial encounter for closed fracture: Principal | ICD-10-CM | POA: Diagnosis present

## 2015-10-31 DIAGNOSIS — I481 Persistent atrial fibrillation: Secondary | ICD-10-CM

## 2015-10-31 DIAGNOSIS — E1122 Type 2 diabetes mellitus with diabetic chronic kidney disease: Secondary | ICD-10-CM | POA: Diagnosis present

## 2015-10-31 DIAGNOSIS — S0990XA Unspecified injury of head, initial encounter: Secondary | ICD-10-CM | POA: Diagnosis not present

## 2015-10-31 DIAGNOSIS — I480 Paroxysmal atrial fibrillation: Secondary | ICD-10-CM | POA: Diagnosis present

## 2015-10-31 DIAGNOSIS — Z8711 Personal history of peptic ulcer disease: Secondary | ICD-10-CM

## 2015-10-31 DIAGNOSIS — E785 Hyperlipidemia, unspecified: Secondary | ICD-10-CM | POA: Diagnosis present

## 2015-10-31 DIAGNOSIS — Z9119 Patient's noncompliance with other medical treatment and regimen: Secondary | ICD-10-CM

## 2015-10-31 DIAGNOSIS — N183 Chronic kidney disease, stage 3 unspecified: Secondary | ICD-10-CM | POA: Diagnosis present

## 2015-10-31 DIAGNOSIS — S92901S Unspecified fracture of right foot, sequela: Secondary | ICD-10-CM | POA: Diagnosis not present

## 2015-10-31 DIAGNOSIS — M79661 Pain in right lower leg: Secondary | ICD-10-CM | POA: Diagnosis not present

## 2015-10-31 DIAGNOSIS — M79671 Pain in right foot: Secondary | ICD-10-CM | POA: Diagnosis not present

## 2015-10-31 DIAGNOSIS — S92321A Displaced fracture of second metatarsal bone, right foot, initial encounter for closed fracture: Secondary | ICD-10-CM | POA: Diagnosis not present

## 2015-10-31 DIAGNOSIS — S93324A Dislocation of tarsometatarsal joint of right foot, initial encounter: Secondary | ICD-10-CM | POA: Diagnosis not present

## 2015-10-31 DIAGNOSIS — S92909S Unspecified fracture of unspecified foot, sequela: Secondary | ICD-10-CM | POA: Diagnosis not present

## 2015-10-31 DIAGNOSIS — S299XXA Unspecified injury of thorax, initial encounter: Secondary | ICD-10-CM | POA: Diagnosis not present

## 2015-10-31 DIAGNOSIS — R109 Unspecified abdominal pain: Secondary | ICD-10-CM | POA: Diagnosis not present

## 2015-10-31 DIAGNOSIS — R079 Chest pain, unspecified: Secondary | ICD-10-CM | POA: Diagnosis not present

## 2015-10-31 DIAGNOSIS — G4733 Obstructive sleep apnea (adult) (pediatric): Secondary | ICD-10-CM | POA: Diagnosis present

## 2015-10-31 DIAGNOSIS — S92331A Displaced fracture of third metatarsal bone, right foot, initial encounter for closed fracture: Secondary | ICD-10-CM | POA: Diagnosis not present

## 2015-10-31 DIAGNOSIS — Z7901 Long term (current) use of anticoagulants: Secondary | ICD-10-CM | POA: Diagnosis not present

## 2015-10-31 DIAGNOSIS — S3991XA Unspecified injury of abdomen, initial encounter: Secondary | ICD-10-CM | POA: Diagnosis not present

## 2015-10-31 DIAGNOSIS — E039 Hypothyroidism, unspecified: Secondary | ICD-10-CM | POA: Diagnosis present

## 2015-10-31 DIAGNOSIS — T148 Other injury of unspecified body region: Secondary | ICD-10-CM | POA: Diagnosis not present

## 2015-10-31 DIAGNOSIS — Y92009 Unspecified place in unspecified non-institutional (private) residence as the place of occurrence of the external cause: Secondary | ICD-10-CM

## 2015-10-31 DIAGNOSIS — S8991XA Unspecified injury of right lower leg, initial encounter: Secondary | ICD-10-CM | POA: Diagnosis not present

## 2015-10-31 DIAGNOSIS — I130 Hypertensive heart and chronic kidney disease with heart failure and stage 1 through stage 4 chronic kidney disease, or unspecified chronic kidney disease: Secondary | ICD-10-CM | POA: Diagnosis present

## 2015-10-31 DIAGNOSIS — S92221A Displaced fracture of lateral cuneiform of right foot, initial encounter for closed fracture: Secondary | ICD-10-CM | POA: Diagnosis not present

## 2015-10-31 DIAGNOSIS — I152 Hypertension secondary to endocrine disorders: Secondary | ICD-10-CM | POA: Diagnosis present

## 2015-10-31 DIAGNOSIS — E876 Hypokalemia: Secondary | ICD-10-CM | POA: Diagnosis present

## 2015-10-31 DIAGNOSIS — Z79899 Other long term (current) drug therapy: Secondary | ICD-10-CM | POA: Diagnosis not present

## 2015-10-31 DIAGNOSIS — E038 Other specified hypothyroidism: Secondary | ICD-10-CM | POA: Diagnosis not present

## 2015-10-31 DIAGNOSIS — I5042 Chronic combined systolic (congestive) and diastolic (congestive) heart failure: Secondary | ICD-10-CM | POA: Diagnosis not present

## 2015-10-31 DIAGNOSIS — S92901D Unspecified fracture of right foot, subsequent encounter for fracture with routine healing: Secondary | ICD-10-CM | POA: Diagnosis not present

## 2015-10-31 DIAGNOSIS — S92909A Unspecified fracture of unspecified foot, initial encounter for closed fracture: Secondary | ICD-10-CM

## 2015-10-31 DIAGNOSIS — G8918 Other acute postprocedural pain: Secondary | ICD-10-CM | POA: Diagnosis not present

## 2015-10-31 DIAGNOSIS — I482 Chronic atrial fibrillation: Secondary | ICD-10-CM | POA: Diagnosis not present

## 2015-10-31 DIAGNOSIS — Z7984 Long term (current) use of oral hypoglycemic drugs: Secondary | ICD-10-CM | POA: Diagnosis not present

## 2015-10-31 DIAGNOSIS — I4891 Unspecified atrial fibrillation: Secondary | ICD-10-CM | POA: Diagnosis present

## 2015-10-31 DIAGNOSIS — W19XXXA Unspecified fall, initial encounter: Secondary | ICD-10-CM

## 2015-10-31 DIAGNOSIS — N1831 Chronic kidney disease, stage 3a: Secondary | ICD-10-CM | POA: Diagnosis present

## 2015-10-31 DIAGNOSIS — S199XXA Unspecified injury of neck, initial encounter: Secondary | ICD-10-CM | POA: Diagnosis not present

## 2015-10-31 HISTORY — DX: Headache, unspecified: R51.9

## 2015-10-31 HISTORY — DX: Malignant (primary) neoplasm, unspecified: C80.1

## 2015-10-31 HISTORY — DX: Headache: R51

## 2015-10-31 HISTORY — DX: Unspecified osteoarthritis, unspecified site: M19.90

## 2015-10-31 LAB — COMPREHENSIVE METABOLIC PANEL
ALBUMIN: 3.6 g/dL (ref 3.5–5.0)
ALT: 16 U/L — ABNORMAL LOW (ref 17–63)
AST: 20 U/L (ref 15–41)
Alkaline Phosphatase: 75 U/L (ref 38–126)
Anion gap: 5 (ref 5–15)
BUN: 18 mg/dL (ref 6–20)
CHLORIDE: 115 mmol/L — AB (ref 101–111)
CO2: 17 mmol/L — ABNORMAL LOW (ref 22–32)
Calcium: 9 mg/dL (ref 8.9–10.3)
Creatinine, Ser: 1.76 mg/dL — ABNORMAL HIGH (ref 0.61–1.24)
GFR calc Af Amer: 45 mL/min — ABNORMAL LOW (ref >60)
GFR calc non Af Amer: 39 mL/min — ABNORMAL LOW (ref >60)
GLUCOSE: 94 mg/dL (ref 65–99)
POTASSIUM: 3.8 mmol/L (ref 3.5–5.1)
SODIUM: 137 mmol/L (ref 135–145)
Total Bilirubin: 0.6 mg/dL (ref 0.3–1.2)
Total Protein: 6.1 g/dL — ABNORMAL LOW (ref 6.5–8.1)

## 2015-10-31 LAB — I-STAT CHEM 8, ED
BUN: 23 mg/dL — AB (ref 6–20)
Calcium, Ion: 1.15 mmol/L (ref 1.12–1.23)
Chloride: 111 mmol/L (ref 101–111)
Creatinine, Ser: 1.7 mg/dL — ABNORMAL HIGH (ref 0.61–1.24)
Glucose, Bld: 89 mg/dL (ref 65–99)
HEMATOCRIT: 42 % (ref 39.0–52.0)
HEMOGLOBIN: 14.3 g/dL (ref 13.0–17.0)
Potassium: 4 mmol/L (ref 3.5–5.1)
SODIUM: 142 mmol/L (ref 135–145)
TCO2: 18 mmol/L (ref 0–100)

## 2015-10-31 LAB — CBC
HEMATOCRIT: 42.3 % (ref 39.0–52.0)
Hemoglobin: 13.1 g/dL (ref 13.0–17.0)
MCH: 25 pg — ABNORMAL LOW (ref 26.0–34.0)
MCHC: 31 g/dL (ref 30.0–36.0)
MCV: 80.6 fL (ref 78.0–100.0)
Platelets: 219 10*3/uL (ref 150–400)
RBC: 5.25 MIL/uL (ref 4.22–5.81)
RDW: 22 % — AB (ref 11.5–15.5)
WBC: 8.9 10*3/uL (ref 4.0–10.5)

## 2015-10-31 LAB — TSH: TSH: 1.402 u[IU]/mL (ref 0.350–4.500)

## 2015-10-31 LAB — URINE MICROSCOPIC-ADD ON

## 2015-10-31 LAB — URINALYSIS, ROUTINE W REFLEX MICROSCOPIC
BILIRUBIN URINE: NEGATIVE
Hgb urine dipstick: NEGATIVE
KETONES UR: 15 mg/dL — AB
LEUKOCYTES UA: NEGATIVE
Nitrite: NEGATIVE
PH: 5.5 (ref 5.0–8.0)
Protein, ur: NEGATIVE mg/dL
SPECIFIC GRAVITY, URINE: 1.027 (ref 1.005–1.030)

## 2015-10-31 LAB — CDS SEROLOGY

## 2015-10-31 LAB — PROTIME-INR
INR: 1.88 — ABNORMAL HIGH (ref 0.00–1.49)
PROTHROMBIN TIME: 21.5 s — AB (ref 11.6–15.2)

## 2015-10-31 LAB — APTT: aPTT: 35 seconds (ref 24–37)

## 2015-10-31 LAB — GLUCOSE, CAPILLARY: GLUCOSE-CAPILLARY: 120 mg/dL — AB (ref 65–99)

## 2015-10-31 LAB — I-STAT CG4 LACTIC ACID, ED: Lactic Acid, Venous: 1.38 mmol/L (ref 0.5–1.9)

## 2015-10-31 LAB — SAMPLE TO BLOOD BANK

## 2015-10-31 MED ORDER — PRAVASTATIN SODIUM 20 MG PO TABS
10.0000 mg | ORAL_TABLET | Freq: Every day | ORAL | Status: DC
  Administered 2015-10-31 – 2015-11-03 (×4): 10 mg via ORAL
  Filled 2015-10-31 (×4): qty 1

## 2015-10-31 MED ORDER — MORPHINE SULFATE (PF) 2 MG/ML IV SOLN
0.5000 mg | INTRAVENOUS | Status: DC | PRN
  Administered 2015-10-31 – 2015-11-01 (×2): 1 mg via INTRAVENOUS
  Filled 2015-10-31 (×3): qty 1

## 2015-10-31 MED ORDER — PANTOPRAZOLE SODIUM 40 MG PO TBEC
40.0000 mg | DELAYED_RELEASE_TABLET | Freq: Every day | ORAL | Status: DC
  Administered 2015-11-01 – 2015-11-04 (×4): 40 mg via ORAL
  Filled 2015-10-31 (×4): qty 1

## 2015-10-31 MED ORDER — HYDROMORPHONE HCL 1 MG/ML IJ SOLN
0.5000 mg | Freq: Once | INTRAMUSCULAR | Status: AC
  Administered 2015-10-31: 0.5 mg via INTRAVENOUS
  Filled 2015-10-31: qty 1

## 2015-10-31 MED ORDER — SENNA 8.6 MG PO TABS
1.0000 | ORAL_TABLET | Freq: Two times a day (BID) | ORAL | Status: DC
  Administered 2015-10-31 – 2015-11-04 (×8): 8.6 mg via ORAL
  Filled 2015-10-31 (×8): qty 1

## 2015-10-31 MED ORDER — LEVOTHYROXINE SODIUM 75 MCG PO TABS
150.0000 ug | ORAL_TABLET | Freq: Every day | ORAL | Status: DC
  Administered 2015-11-01 – 2015-11-04 (×4): 150 ug via ORAL
  Filled 2015-10-31 (×4): qty 2

## 2015-10-31 MED ORDER — HYDROCODONE-ACETAMINOPHEN 5-325 MG PO TABS
1.0000 | ORAL_TABLET | Freq: Four times a day (QID) | ORAL | Status: DC | PRN
  Administered 2015-10-31 – 2015-11-03 (×5): 2 via ORAL
  Filled 2015-10-31 (×6): qty 2

## 2015-10-31 MED ORDER — POTASSIUM CHLORIDE CRYS ER 20 MEQ PO TBCR
40.0000 meq | EXTENDED_RELEASE_TABLET | Freq: Two times a day (BID) | ORAL | Status: DC
  Administered 2015-10-31 – 2015-11-04 (×8): 40 meq via ORAL
  Filled 2015-10-31 (×8): qty 2

## 2015-10-31 MED ORDER — POLYETHYLENE GLYCOL 3350 17 G PO PACK: 17.0000 g | PACK | Freq: Every day | ORAL | Status: DC | PRN

## 2015-10-31 MED ORDER — SODIUM CHLORIDE 0.9 % IV BOLUS (SEPSIS)
500.0000 mL | Freq: Once | INTRAVENOUS | Status: AC
  Administered 2015-10-31: 500 mL via INTRAVENOUS

## 2015-10-31 MED ORDER — INSULIN ASPART 100 UNIT/ML ~~LOC~~ SOLN
0.0000 [IU] | Freq: Three times a day (TID) | SUBCUTANEOUS | Status: DC
  Administered 2015-11-01 – 2015-11-02 (×3): 1 [IU] via SUBCUTANEOUS

## 2015-10-31 MED ORDER — ALBUTEROL SULFATE (2.5 MG/3ML) 0.083% IN NEBU: 2.5000 mg | INHALATION_SOLUTION | Freq: Two times a day (BID) | RESPIRATORY_TRACT | Status: DC | PRN

## 2015-10-31 MED ORDER — IMIPRAMINE HCL 50 MG PO TABS
50.0000 mg | ORAL_TABLET | Freq: Every day | ORAL | Status: DC
  Administered 2015-10-31 – 2015-11-03 (×4): 50 mg via ORAL
  Filled 2015-10-31 (×5): qty 1

## 2015-10-31 MED ORDER — FOLIC ACID 1 MG PO TABS
1.0000 mg | ORAL_TABLET | Freq: Every day | ORAL | Status: DC
  Administered 2015-11-01 – 2015-11-04 (×4): 1 mg via ORAL
  Filled 2015-10-31 (×4): qty 1

## 2015-10-31 MED ORDER — LORAZEPAM 2 MG/ML IJ SOLN
0.5000 mg | Freq: Once | INTRAMUSCULAR | Status: AC
  Administered 2015-10-31: 0.5 mg via INTRAVENOUS
  Filled 2015-10-31: qty 1

## 2015-10-31 MED ORDER — FERROUS SULFATE 325 (65 FE) MG PO TABS
325.0000 mg | ORAL_TABLET | Freq: Every day | ORAL | Status: DC
  Administered 2015-11-01 – 2015-11-04 (×4): 325 mg via ORAL
  Filled 2015-10-31 (×4): qty 1

## 2015-10-31 MED ORDER — DULOXETINE HCL 30 MG PO CPEP
30.0000 mg | ORAL_CAPSULE | Freq: Every day | ORAL | Status: DC
  Administered 2015-11-01 – 2015-11-04 (×5): 30 mg via ORAL
  Filled 2015-10-31 (×5): qty 1

## 2015-10-31 MED ORDER — METHOCARBAMOL 1000 MG/10ML IJ SOLN
500.0000 mg | Freq: Four times a day (QID) | INTRAVENOUS | Status: DC | PRN
  Filled 2015-10-31: qty 5

## 2015-10-31 MED ORDER — CALCIUM POLYCARBOPHIL 625 MG PO TABS
1250.0000 mg | ORAL_TABLET | Freq: Every day | ORAL | Status: DC
Start: 2015-11-01 — End: 2015-11-04
  Administered 2015-11-01 – 2015-11-04 (×4): 1250 mg via ORAL
  Filled 2015-10-31 (×4): qty 2

## 2015-10-31 MED ORDER — DILTIAZEM HCL 30 MG PO TABS
30.0000 mg | ORAL_TABLET | ORAL | Status: DC | PRN
  Filled 2015-10-31: qty 1

## 2015-10-31 MED ORDER — METHOCARBAMOL 500 MG PO TABS
500.0000 mg | ORAL_TABLET | Freq: Four times a day (QID) | ORAL | Status: DC | PRN
  Administered 2015-10-31 – 2015-11-01 (×4): 500 mg via ORAL
  Filled 2015-10-31 (×5): qty 1

## 2015-10-31 MED ORDER — SODIUM CHLORIDE 0.9 % IV SOLN
INTRAVENOUS | Status: DC
  Administered 2015-10-31 – 2015-11-03 (×2): via INTRAVENOUS

## 2015-10-31 MED ORDER — INSULIN ASPART 100 UNIT/ML ~~LOC~~ SOLN: 0.0000 [IU] | Freq: Every day | SUBCUTANEOUS | Status: DC

## 2015-10-31 MED ORDER — HYDROMORPHONE HCL 1 MG/ML IJ SOLN
0.5000 mg | Freq: Once | INTRAMUSCULAR | Status: AC
Start: 2015-10-31 — End: 2015-10-31
  Administered 2015-10-31: 0.5 mg via INTRAVENOUS
  Filled 2015-10-31: qty 1

## 2015-10-31 MED ORDER — CARVEDILOL 12.5 MG PO TABS
12.5000 mg | ORAL_TABLET | Freq: Two times a day (BID) | ORAL | Status: DC
  Administered 2015-10-31 – 2015-11-04 (×8): 12.5 mg via ORAL
  Filled 2015-10-31 (×8): qty 1

## 2015-10-31 NOTE — Progress Notes (Signed)
Orthopedic Tech Progress Note Patient Details:  [name] [name] Apr 19, 1950 [id number]  Ortho Devices Type of Ortho Device: Ace wrap, Post (short leg) splint Ortho Device/Splint Location: RLE Ortho Device/Splint Interventions: Ordered, Application   Braulio Bosch 10/31/2015, 4:31 PM

## 2015-10-31 NOTE — ED Notes (Addendum)
Pt presents to ed after having a mechanical fall from a ladder 6 feet, patient is alert and oriented with complaints of pain in his right foot and ankle, patient stats it looked twisted so he "realigned" it himself and crawled inside to call 911, pulses present, patient received 250 mcg fentanyl iv in route

## 2015-10-31 NOTE — ED Notes (Signed)
Ortho tech at bedside 

## 2015-10-31 NOTE — H&P (Signed)
History and Physical        Hospital Admission Note Date: 10/31/2015  Patient name: [name] [name] Medical record number: [id number] Date of birth: [birth year] Age: 65 y.o. Gender: male  PCP: Donnajean Lopes, MD   Referring physician: Dr Reather Converse  Patient coming from: home   Primary cardiologist: Dr. Irish Lack Dr Allred   Chief Complaint:  Mechanical fall with right foot fracture  HPI: Patient is a 65 year old male with hypertension, paroxysmal atrial fibrillation on xarelto, chronic combined systolic and diastolic CHF (EF 47-07% per echo 8/16), chronic anemia, CKD stage III, obstructive sleep apnea, hypothyroidism who presented from home after a mechanical fall from a ladder 6 feet below. Patient reported that he was pressure washing the side of his house on the ladder, slipped and fell from 6 feet. He did not have any dizziness, lightheadedness or syncopal episode while on the bladder. He denied any cardiac symptoms. Patient was in baseline state of his health prior to this event. Right after the fall, patient had pain in his right foot and ankle and it looked twisted. He crawled inside and called 911.  ED course: CBC showed hemoglobin of 14.3, hematocrit 42.0 lactic acid 1.3, sodium 142, BUN 23, creatinine 1.7, baseline seems to be around 1.4 per labs 8/16   ED work-up/course:   Review of Systems: Positives marked in 'bold' Constitutional: Denies fever, chills, diaphoresis, poor appetite and fatigue.  HEENT: Denies photophobia, eye pain, redness, hearing loss, ear pain, congestion, sore throat, rhinorrhea, sneezing, mouth sores, trouble swallowing, neck pain, neck stiffness and tinnitus.   Respiratory: Denies SOB, DOE, cough, chest tightness,  and wheezing.   Cardiovascular: Denies chest pain, palpitations and leg swelling.  Gastrointestinal: Denies nausea, vomiting,  abdominal pain, diarrhea, constipation, blood in stool and abdominal distention.  Genitourinary: Denies dysuria, urgency, frequency, hematuria, flank pain and difficulty urinating.  Musculoskeletal: At the time of my encounter, patient complained of pain in his right foot and ankle 10/10, sharp, constant, worse with movement  Skin: Denies pallor, rash and wound.  Neurological: Denies dizziness, seizures, syncope, weakness, light-headedness, numbness and headaches.  Hematological: Denies adenopathy. Easy bruising, personal or family bleeding history  Psychiatric/Behavioral: Denies suicidal ideation, mood changes, confusion, nervousness, sleep disturbance and agitation  Past Medical History: Past Medical History  Diagnosis Date  . Hypertension   . PAF (paroxysmal atrial fibrillation) ([place])     a. failed DCCV and multiple anti-arrhythmic drugs (multaq/flecainide);   b. s/p  PVI isolation with ablation of AFib 8/13; c. repeat PVI 01/2013;  d. 02/2013 repeat DCCV->Amio load/xarelto.  . Renal calculi   . Chronic combined systolic and diastolic CHF (congestive heart failure) ([place])     a. LVEF previously 35% felt to be due tachycardia;  b. 02/2013 Echo: EF 50-55%, no rwma, mildly dil LA/RA, mild to mod MR.  . Diabetes mellitus ([place])   . Obesities, morbid ([place])   . Hypothyroid   . Gastric ulcer     s/p prior surgery  . Diverticulosis   . Erectile dysfunction   . Obstructive sleep apnea     noncompliant with CPAP  . Atrial flutter ([place])     a. s/p RFCA 8/13  .  CKD (chronic kidney disease), stage III     hx of a/c renal failure during episode of diverticulitis 9/13 in [place], [place]  . Anemia     Past Surgical History  Procedure Laterality Date  . Wrist sx  1980    right  . Knee sx  1985    both  . Benign stomach tumor removal  2000  . Thyroidectomy  2009    cysts removal   . Laparoscopic retropubic prostatectomy  02/2007    hx prostate cancer  . Kidney stone removal      multiple    . Tee without cardioversion  08/25/2011    Procedure: TRANSESOPHAGEAL ECHOCARDIOGRAM (TEE);  Surgeon: Jettie Booze, MD;  Location: [place];  Service: Cardiovascular;  Laterality: N/A;  . Cardioversion  08/25/2011    Procedure: CARDIOVERSION;  Surgeon: Jettie Booze, MD;  Location: [place];  Service: Cardiovascular;  Laterality: N/A;  . Tee without cardioversion  11/09/2011    Procedure: TRANSESOPHAGEAL ECHOCARDIOGRAM (TEE);  Surgeon: Jettie Booze, MD;  Location: [facility] ENDOSCOPY;  Service: Cardiovascular;  Laterality: N/A;  h/p in file drawer  . Atrial fibrillation ablation  12/06/11; 02/05/2013    Afib and atrial flutter ablation by Dr Rayann Heman; repeat PVI by Dr Rayann Heman 02/05/2013  . Tee without cardioversion N/A 02/05/2013    Procedure: TRANSESOPHAGEAL ECHOCARDIOGRAM (TEE);  Surgeon: Candee Furbish, MD;  Location: [facility] ENDOSCOPY;  Service: Cardiovascular;  Laterality: N/A;  . Cardioversion N/A 03/05/2013    Procedure: CARDIOVERSION;  Surgeon: Sinclair Grooms, MD;  Location: [place];  Service: Cardiovascular;  Laterality: N/A;  BEDSIDE   . Esophagogastroduodenoscopy N/A 04/09/2013    Procedure: ESOPHAGOGASTRODUODENOSCOPY (EGD) with possible Balloon Dilation;  Surgeon: Garlan Fair, MD;  Location: [facility] ENDOSCOPY;  Service: Endoscopy;  Laterality: N/A;  . Atrial fibrillation ablation N/A 12/06/2011    Procedure: ATRIAL FIBRILLATION ABLATION;  Surgeon: Thompson Grayer, MD;  Location: [facility] CATH LAB;  Service: Cardiovascular;  Laterality: N/A;  . Atrial fibrillation ablation N/A 02/05/2013    Procedure: ATRIAL FIBRILLATION ABLATION;  Surgeon: Coralyn Mark, MD;  Location: [place] CATH LAB;  Service: Cardiovascular;  Laterality: N/A;  . Electrophysiologic study N/A 11/24/2014    Procedure: Cardioversion;  Surgeon: Will Meredith Leeds, MD;  Location: [place] CV LAB;  Service: Cardiovascular;  Laterality: N/A;  . Convergent afib abaltion [facility] 02/26/15  02/26/15    [facility] by Dr. Lehman Prom and  Dr. Danise Mina    Medications: Prior to Admission medications   Medication Sig Start Date End Date Taking? Authorizing Provider  albuterol (PROVENTIL) (2.5 MG/3ML) 0.083% nebulizer solution Take 2.5 mg by nebulization 2 (two) times daily as needed for wheezing or shortness of breath.  05/22/13  Yes Historical Provider, MD  Canagliflozin (INVOKANA) 300 MG TABS Take 1 tablet by mouth daily.   Yes Historical Provider, MD  carvedilol (COREG) 12.5 MG tablet Take 1.5 tablets (18.75 mg total) by mouth 2 (two) times daily. Patient taking differently: Take 12.5 mg by mouth 2 (two) times daily.  05/22/15  Yes Thompson Grayer, MD  Cholecalciferol (VITAMIN D) 2000 UNITS CAPS Take 2,000 Units by mouth daily.   Yes Historical Provider, MD  diltiazem (CARDIZEM) 30 MG tablet Take 1 tablet (30 mg total) by mouth as needed (Every 4 hours AS NEEDED with a HR >919 and Systolic >166 (BP)). 0/60/04  Yes Sherran Needs, NP  DULoxetine (CYMBALTA) 30 MG capsule Take 1 capsule by mouth every morning. 10/23/15  Yes Historical Provider, MD  ferrous  sulfate 325 (65 FE) MG tablet Take 1 tablet (325 mg total) by mouth daily with breakfast. 11/24/14  Yes Erlene Quan, PA-C  folic acid (FOLVITE) 1 MG tablet Take 1 mg by mouth daily.   Yes Historical Provider, MD  GLUCOSAMINE PO Take 1,000 mg by mouth 2 (two) times daily.    Yes Historical Provider, MD  imipramine (TOFRANIL) 25 MG tablet Take 50 mg by mouth at bedtime.    Yes Historical Provider, MD  levothyroxine (SYNTHROID, LEVOTHROID) 150 MCG tablet Take 150 mcg by mouth daily before breakfast.   Yes Historical Provider, MD  Multiple Vitamins-Minerals (MENS 50+ MULTI VITAMIN/MIN PO) Take 1 tablet by mouth daily.   Yes Historical Provider, MD  pantoprazole (PROTONIX) 40 MG tablet Take 40 mg by mouth daily.    Yes Historical Provider, MD  polycarbophil (FIBERCON) 625 MG tablet Take 1,250 mg by mouth daily.   Yes Historical Provider, MD  potassium chloride SA (K-DUR,KLOR-CON) 20 MEQ  tablet Take 2 tablets (40 mEq total) by mouth daily. Patient taking differently: Take 40 mEq by mouth 2 (two) times daily.  11/24/14  Yes Luke K Kilroy, PA-C  pravastatin (PRAVACHOL) 20 MG tablet Take 10 mg by mouth at bedtime.    Yes Historical Provider, MD  Rivaroxaban (XARELTO) 15 MG TABS tablet Take 1 tablet (15 mg total) by mouth every evening. 05/22/15  Yes Thompson Grayer, MD    Allergies:   Allergies  Allergen Reactions  . Phenergan [Promethazine Hcl] Itching    Hallucination   . Aspirin     Hx of ulcer   . Bystolic [Nebivolol Hcl] Swelling    Bradycardia   . Calcium Channel Blockers Swelling    LE edema  . Prednisone     Hx of ulcer  . Nsaids     Hx ulcer    Social History:  reports that he has never smoked. He does not have any smokeless tobacco history on file. He reports that he does not drink alcohol or use illicit drugs.  Family History: Family History  Problem Relation Age of Onset  . Emphysema Mother   . Lung cancer Father   . Diabetes Brother   . Colon cancer Neg Hx     Physical Exam: Blood pressure 154/86, pulse 63, temperature 98.1 F (36.7 C), temperature source Oral, resp. rate 18, SpO2 95 %. General: Alert, awake, oriented x3,Uncomfortable with pain  HEENT: normocephalic, atraumatic, anicteric sclera, pink conjunctiva, pupils equal and reactive to light and accomodation, oropharynx clear Neck: supple, no masses or lymphadenopathy, no goiter, no bruits  Heart: Regular rate and rhythm, without murmurs, rubs or gallops. Lungs: Clear to auscultation bilaterally, no wheezing, rales or rhonchi. Abdomen: Soft, nontender, nondistended, positive bowel sounds, no masses. Extremities: No clubbing, cyanosis or edema LLE, right lower extremity splinted  Neuro: Grossly intact, no focal neurological deficits, strength 5/5 upper and lower extremities bilaterally Psych: alert and oriented x 3, normal mood and affect Skin: no rashes or lesions, warm and dry   LABS  on Admission:  Basic Metabolic Panel:  Recent Labs Lab 10/31/15 1330 10/31/15 1338  NA 137 142  K 3.8 4.0  CL 115* 111  CO2 17*  --   GLUCOSE 94 89  BUN 18 23*  CREATININE 1.76* 1.70*  CALCIUM 9.0  --    Liver Function Tests:  Recent Labs Lab 10/31/15 1330  AST 20  ALT 16*  ALKPHOS 75  BILITOT 0.6  PROT 6.1*  ALBUMIN 3.6  No results for input(s): LIPASE, AMYLASE in the last 168 hours. No results for input(s): AMMONIA in the last 168 hours. CBC:  Recent Labs Lab 10/31/15 1330 10/31/15 1338  WBC 8.9  --   HGB 13.1 14.3  HCT 42.3 42.0  MCV 80.6  --   PLT 219  --    Cardiac Enzymes: No results for input(s): CKTOTAL, CKMB, CKMBINDEX, TROPONINI in the last 168 hours. BNP: Invalid input(s): POCBNP CBG: No results for input(s): GLUCAP in the last 168 hours.  Radiological Exams on Admission:  Ct Abdomen Pelvis Wo Contrast  10/31/2015  CLINICAL DATA:  Pain after trauma. EXAM: CT CHEST, ABDOMEN AND PELVIS WITHOUT CONTRAST TECHNIQUE: Multidetector CT imaging of the chest, abdomen and pelvis was performed following the standard protocol without IV contrast. COMPARISON:  CT of the chest January 08, 2011 FINDINGS: CT CHEST The central airways are normal. No pneumothorax. No pulmonary not, masses, or focal infiltrates. The thoracic aorta is normal in caliber measuring 3.9 cm. No aneurysm. The central pulmonary arteries are normal in caliber as well. There are coronary artery calcifications. The heart size is normal. No effusions. No adenopathy. CT ABDOMEN AND PELVIS No free air or free fluid. Evaluation of parenchymal organs is limited without contrast. The liver, gallbladder, spleen, adrenal glands, and pancreas are unremarkable. There is fatty deposition within the pancreatic head. Bilateral renal stones are seen, most numerous in the left lower pole. No hydronephrosis or perinephric stranding. No ureterectasis or ureteral stones. Atherosclerosis is seen in the abdominal aorta  which is non aneurysmal. No adenopathy. A suture line is associated with the stomach consistent previous gastric surgery. The stomach and small bowel are otherwise normal. The colon demonstrates colonic diverticuli. Slight increased attenuation in the fat adjacent to the distal descending colon could represent very subtle diverticulitis. The remainder of the colon is normal. The appendix is unremarkable. The pelvis demonstrates a small cyst on the left on series 7, image 246 measuring 14 mm. This is of doubtful acute significance. No suspicious adenopathy or mass. The right bladder dome herniates into a right inguinal hernia. The remainder of the bladder is normal. No other abnormalities in the pelvis. Subcortical sclerosis in the femoral head suggests small regions of DVT and. No acute bony abnormalities. There is a fat containing umbilical hernia. Another fat containing hernia is seen anteriorly, above the umbilicus. IMPRESSION: 1. Nonobstructive bilateral renal stones. 2. Atherosclerosis in the abdominal aorta. 3. 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 Minimal increased attenuation in the fat adjacent to the distal descending colon could represent mild diverticulitis in the appropriate clinical setting. 4. A small 14 mm cyst in the left lateral pelvis is of doubtful significance. This could represent a lymphocele or duplication cyst. 5. The right bladder dome herniates into a right inguinal hernia. This is not of acute significance and likely chronic. 6. 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 Electronically Signed   By: Dorise Bullion III M.D   On: 10/31/2015 15:32   Dg Tibia/fibula Right  10/31/2015  CLINICAL DATA:  Distal tib-fib pain after fall from ladder this afternoon. History of right ankle fracture. EXAM: RIGHT TIBIA AND FIBULA - 2 VIEW COMPARISON:  None. FINDINGS: There is no evidence of fracture or other focal bone lesions. Soft tissues are unremarkable. IMPRESSION: Negative. Electronically Signed   By:  Franki Cabot M.D.   On: 10/31/2015 16:18   Ct Head Wo Contrast  10/31/2015  CLINICAL DATA:  Patient status post fall from ladder, 60 high. No reported  loss of consciousness. Alert and oriented. EXAM: CT HEAD WITHOUT CONTRAST CT CERVICAL SPINE WITHOUT CONTRAST TECHNIQUE: Multidetector CT imaging of the head and cervical spine was performed following the standard protocol without intravenous contrast. Multiplanar CT image reconstructions of the cervical spine were also generated. COMPARISON:  None. FINDINGS: CT HEAD FINDINGS Ventricles and sulci are appropriate for patient's age. No evidence for acute cortically based infarct, intracranial hemorrhage, mass lesion or mass-effect. Orbits are unremarkable. Paranasal sinuses are well aerated. Mastoid air cells are unremarkable. Calvarium is intact. CT CERVICAL SPINE FINDINGS Normal anatomic alignment. Multilevel degenerative disc disease most pronounced C4-5, C5-6 C6-7. Multilevel facet degenerative changes. Craniocervical junction is intact. Lateral masses articulate appropriately with the dens. Prevertebral soft tissues are unremarkable. IMPRESSION: No acute intracranial process. No acute cervical spine fracture. Electronically Signed   By: Lovey Newcomer M.D.   On: 10/31/2015 15:15   Ct Chest Wo Contrast  10/31/2015  CLINICAL DATA:  Pain after trauma. EXAM: CT CHEST, ABDOMEN AND PELVIS WITHOUT CONTRAST TECHNIQUE: Multidetector CT imaging of the chest, abdomen and pelvis was performed following the standard protocol without IV contrast. COMPARISON:  CT of the chest January 08, 2011 FINDINGS: CT CHEST The central airways are normal. No pneumothorax. No pulmonary not, masses, or focal infiltrates. The thoracic aorta is normal in caliber measuring 3.9 cm. No aneurysm. The central pulmonary arteries are normal in caliber as well. There are coronary artery calcifications. The heart size is normal. No effusions. No adenopathy. CT ABDOMEN AND PELVIS No free air or free  fluid. Evaluation of parenchymal organs is limited without contrast. The liver, gallbladder, spleen, adrenal glands, and pancreas are unremarkable. There is fatty deposition within the pancreatic head. Bilateral renal stones are seen, most numerous in the left lower pole. No hydronephrosis or perinephric stranding. No ureterectasis or ureteral stones. Atherosclerosis is seen in the abdominal aorta which is non aneurysmal. No adenopathy. A suture line is associated with the stomach consistent previous gastric surgery. The stomach and small bowel are otherwise normal. The colon demonstrates colonic diverticuli. Slight increased attenuation in the fat adjacent to the distal descending colon could represent very subtle diverticulitis. The remainder of the colon is normal. The appendix is unremarkable. The pelvis demonstrates a small cyst on the left on series 7, image 246 measuring 14 mm. This is of doubtful acute significance. No suspicious adenopathy or mass. The right bladder dome herniates into a right inguinal hernia. The remainder of the bladder is normal. No other abnormalities in the pelvis. Subcortical sclerosis in the femoral head suggests small regions of DVT and. No acute bony abnormalities. There is a fat containing umbilical hernia. Another fat containing hernia is seen anteriorly, above the umbilicus. IMPRESSION: 1. Nonobstructive bilateral renal stones. 2. Atherosclerosis in the abdominal aorta. 3. 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 Minimal increased attenuation in the fat adjacent to the distal descending colon could represent mild diverticulitis in the appropriate clinical setting. 4. A small 14 mm cyst in the left lateral pelvis is of doubtful significance. This could represent a lymphocele or duplication cyst. 5. The right bladder dome herniates into a right inguinal hernia. This is not of acute significance and likely chronic. 6. 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 Electronically Signed   By: Dorise Bullion III M.D   On: 10/31/2015 15:32   Ct Cervical Spine Wo Contrast  10/31/2015  CLINICAL DATA:  Patient status post fall from ladder, 60 high. No reported loss of consciousness. Alert and oriented. EXAM: CT HEAD WITHOUT  CONTRAST CT CERVICAL SPINE WITHOUT CONTRAST TECHNIQUE: Multidetector CT imaging of the head and cervical spine was performed following the standard protocol without intravenous contrast. Multiplanar CT image reconstructions of the cervical spine were also generated. COMPARISON:  None. FINDINGS: CT HEAD FINDINGS Ventricles and sulci are appropriate for patient's age. No evidence for acute cortically based infarct, intracranial hemorrhage, mass lesion or mass-effect. Orbits are unremarkable. Paranasal sinuses are well aerated. Mastoid air cells are unremarkable. Calvarium is intact. CT CERVICAL SPINE FINDINGS Normal anatomic alignment. Multilevel degenerative disc disease most pronounced C4-5, C5-6 C6-7. Multilevel facet degenerative changes. Craniocervical junction is intact. Lateral masses articulate appropriately with the dens. Prevertebral soft tissues are unremarkable. IMPRESSION: No acute intracranial process. No acute cervical spine fracture. Electronically Signed   By: Lovey Newcomer M.D.   On: 10/31/2015 15:15   Ct Foot Right Wo Contrast  10/31/2015  CLINICAL DATA:  Status post 6 foot fall from a ladder today with a Lisfranc injury of the right foot. Initial encounter. EXAM: CT OF THE RIGHT FOOT WITHOUT CONTRAST TECHNIQUE: Multidetector CT imaging of the right foot was performed according to the standard protocol. Multiplanar CT image reconstructions were also generated. COMPARISON:  Plain films right foot this same day. FINDINGS: There is a chip fracture off the medial corner of the base of the fourth metatarsal. Fracture fragment measures a maximum of approximately 0.5 cm. Also seen is a chip fracture off the plantar aspect of the base of the third metatarsal. Minimally comminuted  fracture off the inferior and medial corner of the base of the second metatarsal is also identified. Additional chip fracture is seen off the dorsal margin of the distal lateral cuneiform. No other fracture is identified. The first, second and third metatarsals ball are laterally subluxed approximately 0.5 cm of the tarsometatarsal joints. Soft tissue swelling and hematoma are seen about the foot. The Lisfranc ligament is markedly indistinct and torn. Ligaments are not well assessed on CT. Intrinsic musculature the foot appears atrophied. IMPRESSION: Findings consistent with a Lisfranc injury with chip fractures off the second, third and fourth metatarsals and lateral cuneiform as described above. The Lisfranc ligament is torn. Evaluation of ligaments on CT scan is limited. Electronically Signed   By: Inge Rise M.D.   On: 10/31/2015 16:28   Dg Chest Port 1 View  10/31/2015  CLINICAL DATA:  Pain after fall EXAM: PORTABLE CHEST 1 VIEW COMPARISON:  November 22, 2014 FINDINGS: The heart size and mediastinal contours are within normal limits. Both lungs are clear. The visualized skeletal structures are unremarkable. IMPRESSION: No active disease. Electronically Signed   By: Dorise Bullion III M.D   On: 10/31/2015 13:24   Dg Foot Complete Right  10/31/2015  CLINICAL DATA:  Pain after fall EXAM: RIGHT FOOT COMPLETE - 3+ VIEW COMPARISON:  None. FINDINGS: The bases of the first and second metatarsals do not align appropriately with the adjacent cuneiform bones. There also appear to be a few tiny bony fragments between the bases of the first and second metatarsals. Soft tissue swelling is identified. No other acute abnormalities. IMPRESSION: 1. Lis franc injuries at the bases of the first and second metatarsals with malalignment in addition to tiny bony fragments. Recommend a CT scan for better evaluation. Electronically Signed   By: Dorise Bullion III M.D   On: 10/31/2015 13:27    *I have personally reviewed  the images above*  EKG: Independently reviewed. Rate 77, normal sinus rhythm, no ischemic changes  Assessment/Plan Principal Problem:  Foot fracture, right: Secondary to mechanical fall from the ladder. The patient was in baseline state of health prior to the event, no syncopal episode. MET>4. The patient has chronic medical conditions, atrial fibrillation on anticoagulation, CKD stage III, hypertension, diabetes, chronic systolic and diastolic CHF with preserved EF, currently all stable, no active cardiac symptoms. Moderate risk due to his chronic conditions, since no active condition, cleared for surgery, will have to hold anticoagulation. - Orthopedics have been consulted, will defer the timing of surgery to orthopedics - Hold xarelto, last dose yesterday evening (7/21) -  place on pain management with bowel regimen - will restart xarelto after the surgery once cleared by orthopedics - Currently right lower extremity placed in the posterior short leg splint, non weightbearing  Active Problems:   Atrial fibrillation ([place]) on chronic anticoagulation - Rate controlled, continue Coreg. Patient also takes as needed Cardizem for heart rate above 100 - [place] vasc >4: On anticoagulation medication, hold xarelto for surgery    Obstructive sleep apnea-C-pap intol - O2 via nasal cannula    Hypertension - Currently stable, continue Coreg  Mild acute on  CKD (chronic kidney disease), stage III - Patient was working outside, pressure washing his house, possibly some component of dehydration, not on any diuretics - Place on gentle hydration for 1 L then saline lock    Chronic combined systolic and diastolic heart failure ([place]): EF 60-65% per echo 8/16 - Currently euvolemic and appears to be somewhat dehydrated, not on any diuretics    Type 2 diabetes mellitus with stage 3 chronic kidney disease ([place]) - Obtain a hemoglobin A1c, place on sliding scale insulin while inpatient    Hypothyroidism -  Obtain TSH, place on Synthroid  DVT prophylaxis: Hold xarelto, SCDs  CODE STATUS: Full CODE STATUS, confirmed with patient  Consults called: Musician has been consulted by EDP  Family Communication: Admission, patients condition and plan of care including tests being ordered have been discussed with the patient and wife  who indicates understanding and agree with the plan and Code Status  Admission status: Inpatient telemetry  Disposition plan: Further plan will depend as patient's clinical course evolves and further radiologic and laboratory data become available.   Time Spent on Admission: 60 minutes   RAI,RIPUDEEP M.D. [facility] 10/31/2015, 5:46 PM Pager: [phone]  If 7PM-7AM, please contact night-coverage [email or web address] Password [email or web address]

## 2015-10-31 NOTE — ED Notes (Signed)
Placed patient into gown and on the monitor 

## 2015-10-31 NOTE — ED Notes (Signed)
Patient transported to X-ray 

## 2015-10-31 NOTE — ED Notes (Signed)
Pt returned from ct and placed back on monitor

## 2015-10-31 NOTE — ED Provider Notes (Signed)
CSN: [id number]     Arrival date & time 10/31/15  1248 History   First MD Initiated Contact with Patient 10/31/15 1254     Chief Complaint  Patient presents with  . Fall  . Leg Injury     (Consider location/radiation/quality/duration/timing/severity/associated sxs/prior Treatment) HPI Patient with history of atrial fibrillation and on Xarelto presents after falling off a ladder while pressure washing. Fell roughly 6 feet. He complains of pain to right foot. Per patient. Was initially deformed and patient then realigned it. He also is complaining of left sided chest pain. Denies hitting his head. No loss of consciousness. No shortness of breath. No focal weakness or numbness. Given 250 g of fentanyl en route. Past Medical History:  Diagnosis Date  . Anemia   . Arthritis    OA  . Atrial flutter ([place])    a. s/p RFCA 8/13  . Cancer (HCC)    HISTORY OF PROSTATE CANCER  . Chronic combined systolic and diastolic CHF (congestive heart failure) ([place])    a. LVEF previously 35% felt to be due tachycardia;  b. 02/2013 Echo: EF 50-55%, no rwma, mildly dil LA/RA, mild to mod MR. C 11/2014 echo - ef 60-65%, unable to determine DD  . CKD (chronic kidney disease), stage III    hx of a/c renal failure during episode of diverticulitis 9/13 in [place], [place]  . Diabetes mellitus ([place])    TYPE 2   . Diverticulosis   . Erectile dysfunction   . Gastric ulcer    s/p prior surgery  . Headache   . Hypertension   . Hypothyroid   . Obesities, morbid ([place])   . Obstructive sleep apnea    noncompliant with CPAP  . PAF (paroxysmal atrial fibrillation) ([place])    a. failed DCCV and multiple anti-arrhythmic drugs (multaq/flecainide);   b. s/p  PVI isolation with ablation of AFib 8/13; c. repeat PVI 01/2013;  d. 02/2013 repeat DCCV->Amio load/xarelto.  . Renal calculi    Past Surgical History:  Procedure Laterality Date  . ATRIAL FIBRILLATION ABLATION  12/06/11; 02/05/2013   Afib and atrial flutter ablation by  Dr Rayann Heman; repeat PVI by Dr Rayann Heman 02/05/2013  . ATRIAL FIBRILLATION ABLATION N/A 12/06/2011   Procedure: ATRIAL FIBRILLATION ABLATION;  Surgeon: Thompson Grayer, MD;  Location: [facility] CATH LAB;  Service: Cardiovascular;  Laterality: N/A;  . ATRIAL FIBRILLATION ABLATION N/A 02/05/2013   Procedure: ATRIAL FIBRILLATION ABLATION;  Surgeon: Coralyn Mark, MD;  Location: [place] CATH LAB;  Service: Cardiovascular;  Laterality: N/A;  . benign stomach tumor removal  2000  . CARDIOVERSION  08/25/2011   Procedure: CARDIOVERSION;  Surgeon: Jettie Booze, MD;  Location: [place];  Service: Cardiovascular;  Laterality: N/A;  . CARDIOVERSION N/A 03/05/2013   Procedure: CARDIOVERSION;  Surgeon: Sinclair Grooms, MD;  Location: [place];  Service: Cardiovascular;  Laterality: N/A;  BEDSIDE   . convergent afib abaltion [facility] 02/26/15  02/26/15   [facility] by Dr. Lehman Prom and Dr. Danise Mina  . ELECTROPHYSIOLOGIC STUDY N/A 11/24/2014   Procedure: Cardioversion;  Surgeon: Will Meredith Leeds, MD;  Location: [place] CV LAB;  Service: Cardiovascular;  Laterality: N/A;  . ESOPHAGOGASTRODUODENOSCOPY N/A 04/09/2013   Procedure: ESOPHAGOGASTRODUODENOSCOPY (EGD) with possible Balloon Dilation;  Surgeon: Garlan Fair, MD;  Location: [facility] ENDOSCOPY;  Service: Endoscopy;  Laterality: N/A;  . kidney stone removal     multiple  . knee sx  1985   both  . LAPAROSCOPIC RETROPUBIC PROSTATECTOMY  02/2007   hx prostate  cancer  . ORIF ANKLE FRACTURE Right 10/2015  . ORIF ANKLE FRACTURE Right 11/03/2015   Procedure: ORIF RIGHT LISFRANK FOOT;  Surgeon: Newt Minion, MD;  Location: [place];  Service: Orthopedics;  Laterality: Right;  . TEE WITHOUT CARDIOVERSION  08/25/2011   Procedure: TRANSESOPHAGEAL ECHOCARDIOGRAM (TEE);  Surgeon: Jettie Booze, MD;  Location: [place];  Service: Cardiovascular;  Laterality: N/A;  . TEE WITHOUT CARDIOVERSION  11/09/2011   Procedure: TRANSESOPHAGEAL ECHOCARDIOGRAM (TEE);  Surgeon: Jettie Booze, MD;  Location: [facility] ENDOSCOPY;  Service: Cardiovascular;  Laterality: N/A;  h/p in file drawer  . TEE WITHOUT CARDIOVERSION N/A 02/05/2013   Procedure: TRANSESOPHAGEAL ECHOCARDIOGRAM (TEE);  Surgeon: Candee Furbish, MD;  Location: [facility] ENDOSCOPY;  Service: Cardiovascular;  Laterality: N/A;  . THYROIDECTOMY  2009   cysts removal   . wrist sx  1980   right   Family History  Problem Relation Age of Onset  . Emphysema Mother   . Lung cancer Father   . Diabetes Brother   . Colon cancer Neg Hx    Social History  Substance Use Topics  . Smoking status: Never Smoker  . Smokeless tobacco: Never Used  . Alcohol use No    Review of Systems  Constitutional: Negative for fever and chills.  HENT: Negative for facial swelling.   Eyes: Negative for visual disturbance.  Respiratory: Negative for shortness of breath.   Cardiovascular: Positive for chest pain.  Gastrointestinal: Negative for nausea, vomiting, abdominal pain, diarrhea and constipation.  Musculoskeletal: Positive for arthralgias. Negative for myalgias, back pain, neck pain and neck stiffness.  Skin: Negative for rash and wound.  Neurological: Negative for dizziness, syncope, weakness, light-headedness, numbness and headaches.  All other systems reviewed and are negative.     Allergies  Phenergan [promethazine hcl]; Aspirin; Bystolic [nebivolol hcl]; Calcium channel blockers; Prednisone; and Nsaids  Home Medications   Prior to Admission medications   Medication Sig Start Date End Date Taking? Authorizing Provider  albuterol (PROVENTIL) (2.5 MG/3ML) 0.083% nebulizer solution Take 2.5 mg by nebulization 2 (two) times daily as needed for wheezing or shortness of breath.  05/22/13  Yes Historical Provider, MD  Canagliflozin (INVOKANA) 300 MG TABS Take 1 tablet by mouth daily.   Yes Historical Provider, MD  carvedilol (COREG) 12.5 MG tablet Take 1.5 tablets (18.75 mg total) by mouth 2 (two) times daily. Patient taking  differently: Take 12.5 mg by mouth 2 (two) times daily.  05/22/15  Yes Thompson Grayer, MD  Cholecalciferol (VITAMIN D) 2000 UNITS CAPS Take 2,000 Units by mouth daily.   Yes Historical Provider, MD  diltiazem (CARDIZEM) 30 MG tablet Take 1 tablet (30 mg total) by mouth as needed (Every 4 hours AS NEEDED with a HR 123XX123 and Systolic 123XX123 (BP)). [id number]  Yes Sherran Needs, NP  DULoxetine (CYMBALTA) 30 MG capsule Take 1 capsule by mouth every morning. 10/23/15  Yes Historical Provider, MD  ferrous sulfate 325 (65 FE) MG tablet Take 1 tablet (325 mg total) by mouth daily with breakfast. 11/24/14  Yes Erlene Quan, PA-C  folic acid (FOLVITE) 1 MG tablet Take 1 mg by mouth daily.   Yes Historical Provider, MD  GLUCOSAMINE PO Take 1,000 mg by mouth 2 (two) times daily.    Yes Historical Provider, MD  imipramine (TOFRANIL) 25 MG tablet Take 50 mg by mouth at bedtime.    Yes Historical Provider, MD  levothyroxine (SYNTHROID, LEVOTHROID) 150 MCG tablet Take 150 mcg by mouth daily before breakfast.  Yes Historical Provider, MD  Multiple Vitamins-Minerals (MENS 50+ MULTI VITAMIN/MIN PO) Take 1 tablet by mouth daily.   Yes Historical Provider, MD  pantoprazole (PROTONIX) 40 MG tablet Take 40 mg by mouth daily.    Yes Historical Provider, MD  polycarbophil (FIBERCON) 625 MG tablet Take 1,250 mg by mouth daily.   Yes Historical Provider, MD  potassium chloride SA (K-DUR,KLOR-CON) 20 MEQ tablet Take 2 tablets (40 mEq total) by mouth daily. Patient taking differently: Take 40 mEq by mouth 2 (two) times daily.  11/24/14  Yes Luke K Kilroy, PA-C  pravastatin (PRAVACHOL) 20 MG tablet Take 10 mg by mouth at bedtime.    Yes Historical Provider, MD  Rivaroxaban (XARELTO) 15 MG TABS tablet Take 1 tablet (15 mg total) by mouth every evening. 05/22/15  Yes Thompson Grayer, MD  HYDROcodone-acetaminophen (NORCO/VICODIN) 5-325 MG tablet Take 2 tablets by mouth every 6 (six) hours as needed for moderate pain. 11/04/15   Nishant Dhungel,  MD  polyethylene glycol (MIRALAX / GLYCOLAX) packet Take 17 g by mouth daily as needed for mild constipation. 11/04/15   Nishant Dhungel, MD   BP (!) 108/55   Pulse 61   Temp 98.8 F (37.1 C)   Resp 16   SpO2 100%  Physical Exam  Constitutional: He is oriented to person, place, and time. He appears well-developed and well-nourished. He appears distressed.  HENT:  Head: Normocephalic and atraumatic.  Mouth/Throat: Oropharynx is clear and moist.  No obvious head or neck injury. No midface instability. No malocclusion.  Eyes: EOM are normal. Pupils are equal, round, and reactive to light.  Neck: Normal range of motion. Neck supple.  No posterior midline cervical tenderness to palpation.  Cardiovascular: Normal rate and regular rhythm.  Exam reveals no gallop and no friction rub.   No murmur heard. Pulmonary/Chest: Effort normal and breath sounds normal. No respiratory distress. He has no wheezes. He has no rales. He exhibits tenderness (left anterior and lateral chest wall tenderness. No definite crepitance or deformity.).  Abdominal: Soft. Bowel sounds are normal. He exhibits no distension and no mass. There is no tenderness. There is no rebound and no guarding.  Musculoskeletal: Normal range of motion. He exhibits tenderness. He exhibits no edema.  Patient with tenderness over the dorsum of the right midfoot. No tenderness at the right ankle, right knee or right hip. Decreased range of motion of the right toes due to pain. Pelvis is stable. No midline thoracic or lumbar tenderness. Brisk capillary refill. No tenderness over the calcaneus.  Neurological: He is alert and oriented to person, place, and time.  5/5 motor in all extremities. Sensation is fully intact. Limited range of motion of the right foot pain.  Skin: Skin is warm and dry. No rash noted. No erythema.  Multiple contusions of different stages of healing present.  Psychiatric: He has a normal mood and affect. His behavior is  normal.  Nursing note and vitals reviewed.   ED Course  Procedures (including critical care time) Labs Review Labs Reviewed  COMPREHENSIVE METABOLIC PANEL - Abnormal; Notable for the following:       Result Value   Chloride 115 (*)    CO2 17 (*)    Creatinine, Ser 1.76 (*)    Total Protein 6.1 (*)    ALT 16 (*)    GFR calc non Af Amer 39 (*)    GFR calc Af Amer 45 (*)    All other components within normal limits  CBC -  Abnormal; Notable for the following:    MCH 25.0 (*)    RDW 22.0 (*)    All other components within normal limits  URINALYSIS, ROUTINE W REFLEX MICROSCOPIC (NOT AT [facility]) - Abnormal; Notable for the following:    Glucose, UA >1000 (*)    Ketones, ur 15 (*)    All other components within normal limits  PROTIME-INR - Abnormal; Notable for the following:    Prothrombin Time 21.5 (*)    INR 1.88 (*)    All other components within normal limits  HEMOGLOBIN A1C - Abnormal; Notable for the following:    Hgb A1c MFr Bld 6.7 (*)    All other components within normal limits  CBC - Abnormal; Notable for the following:    Hemoglobin 12.1 (*)    MCH 25.2 (*)    RDW 21.9 (*)    All other components within normal limits  BASIC METABOLIC PANEL - Abnormal; Notable for the following:    Potassium 3.4 (*)    Chloride 112 (*)    CO2 19 (*)    Glucose, Bld 111 (*)    Creatinine, Ser 1.46 (*)    Calcium 8.6 (*)    GFR calc non Af Amer 49 (*)    GFR calc Af Amer 56 (*)    All other components within normal limits  GLUCOSE, CAPILLARY - Abnormal; Notable for the following:    Glucose-Capillary 120 (*)    All other components within normal limits  URINE MICROSCOPIC-ADD ON - Abnormal; Notable for the following:    Squamous Epithelial / LPF 0-5 (*)    Bacteria, UA RARE (*)    Casts HYALINE CASTS (*)    All other components within normal limits  GLUCOSE, CAPILLARY - Abnormal; Notable for the following:    Glucose-Capillary 108 (*)    All other components within normal  limits  GLUCOSE, CAPILLARY - Abnormal; Notable for the following:    Glucose-Capillary 128 (*)    All other components within normal limits  GLUCOSE, CAPILLARY - Abnormal; Notable for the following:    Glucose-Capillary 121 (*)    All other components within normal limits  CBC - Abnormal; Notable for the following:    Hemoglobin 11.6 (*)    HCT 37.2 (*)    MCH 25.3 (*)    RDW 21.5 (*)    All other components within normal limits  BASIC METABOLIC PANEL - Abnormal; Notable for the following:    CO2 19 (*)    Glucose, Bld 103 (*)    Creatinine, Ser 1.28 (*)    Calcium 8.5 (*)    GFR calc non Af Amer 57 (*)    All other components within normal limits  GLUCOSE, CAPILLARY - Abnormal; Notable for the following:    Glucose-Capillary 139 (*)    All other components within normal limits  GLUCOSE, CAPILLARY - Abnormal; Notable for the following:    Glucose-Capillary 108 (*)    All other components within normal limits  GLUCOSE, CAPILLARY - Abnormal; Notable for the following:    Glucose-Capillary 140 (*)    All other components within normal limits  GLUCOSE, CAPILLARY - Abnormal; Notable for the following:    Glucose-Capillary 127 (*)    All other components within normal limits  GLUCOSE, CAPILLARY - Abnormal; Notable for the following:    Glucose-Capillary 144 (*)    All other components within normal limits  CBC - Abnormal; Notable for the following:    Hemoglobin 11.8 (*)  HCT 37.9 (*)    MCH 25.4 (*)    RDW 21.2 (*)    All other components within normal limits  BASIC METABOLIC PANEL - Abnormal; Notable for the following:    Glucose, Bld 124 (*)    Creatinine, Ser 1.30 (*)    Calcium 8.6 (*)    GFR calc non Af Amer 56 (*)    All other components within normal limits  GLUCOSE, CAPILLARY - Abnormal; Notable for the following:    Glucose-Capillary 150 (*)    All other components within normal limits  GLUCOSE, CAPILLARY - Abnormal; Notable for the following:     Glucose-Capillary 103 (*)    All other components within normal limits  GLUCOSE, CAPILLARY - Abnormal; Notable for the following:    Glucose-Capillary 104 (*)    All other components within normal limits  GLUCOSE, CAPILLARY - Abnormal; Notable for the following:    Glucose-Capillary 103 (*)    All other components within normal limits  GLUCOSE, CAPILLARY - Abnormal; Notable for the following:    Glucose-Capillary 101 (*)    All other components within normal limits  GLUCOSE, CAPILLARY - Abnormal; Notable for the following:    Glucose-Capillary 131 (*)    All other components within normal limits  GLUCOSE, CAPILLARY - Abnormal; Notable for the following:    Glucose-Capillary 105 (*)    All other components within normal limits  GLUCOSE, CAPILLARY - Abnormal; Notable for the following:    Glucose-Capillary 134 (*)    All other components within normal limits  I-STAT CHEM 8, ED - Abnormal; Notable for the following:    BUN 23 (*)    Creatinine, Ser 1.70 (*)    All other components within normal limits  SURGICAL PCR SCREEN  CDS SEROLOGY  APTT  TSH  GLUCOSE, CAPILLARY  I-STAT CG4 LACTIC ACID, ED  SAMPLE TO BLOOD BANK    Imaging Review No results found. I have personally reviewed and evaluated these images and lab results as part of my medical decision-making.   EKG Interpretation  Date/Time:  Saturday October 31 2015 12:48:30 EDT Ventricular Rate:  77 PR Interval:    QRS Duration: 97 QT Interval:  382 QTC Calculation: 433 R Axis:   -29 Text Interpretation:  Sinus rhythm Borderline left axis deviation Electrode noise No significant change since last tracing 21 Apr 2015 Confirmed by [facility]  MD-I, IVA ([id number]) on 11/01/2015 6:45:29 PM       MDM   Final diagnoses:  Fall  Foot fracture, right, closed, initial encounter  Foot fracture   CT without any evidence of acute injuries other than Lisfranc fractures of the right foot. Discuss with [facility] orthopedics. Agree with  posterior short leg splint, nonweightbearing. Will see patient in consult. Recommend hospitalist admit.     Julianne Rice, MD 11/14/15 2329

## 2015-11-01 DIAGNOSIS — S92909S Unspecified fracture of unspecified foot, sequela: Secondary | ICD-10-CM

## 2015-11-01 DIAGNOSIS — I482 Chronic atrial fibrillation: Secondary | ICD-10-CM

## 2015-11-01 LAB — BASIC METABOLIC PANEL
Anion gap: 7 (ref 5–15)
BUN: 17 mg/dL (ref 6–20)
CO2: 19 mmol/L — ABNORMAL LOW (ref 22–32)
Calcium: 8.6 mg/dL — ABNORMAL LOW (ref 8.9–10.3)
Chloride: 112 mmol/L — ABNORMAL HIGH (ref 101–111)
Creatinine, Ser: 1.46 mg/dL — ABNORMAL HIGH (ref 0.61–1.24)
GFR calc Af Amer: 56 mL/min — ABNORMAL LOW (ref >60)
GFR calc non Af Amer: 49 mL/min — ABNORMAL LOW (ref >60)
Glucose, Bld: 111 mg/dL — ABNORMAL HIGH (ref 65–99)
Potassium: 3.4 mmol/L — ABNORMAL LOW (ref 3.5–5.1)
Sodium: 138 mmol/L (ref 135–145)

## 2015-11-01 LAB — CBC
HCT: 39.2 % (ref 39.0–52.0)
Hemoglobin: 12.1 g/dL — ABNORMAL LOW (ref 13.0–17.0)
MCH: 25.2 pg — ABNORMAL LOW (ref 26.0–34.0)
MCHC: 30.9 g/dL (ref 30.0–36.0)
MCV: 81.7 fL (ref 78.0–100.0)
Platelets: 188 10*3/uL (ref 150–400)
RBC: 4.8 MIL/uL (ref 4.22–5.81)
RDW: 21.9 % — ABNORMAL HIGH (ref 11.5–15.5)
WBC: 8.4 10*3/uL (ref 4.0–10.5)

## 2015-11-01 LAB — GLUCOSE, CAPILLARY
GLUCOSE-CAPILLARY: 108 mg/dL — AB (ref 65–99)
GLUCOSE-CAPILLARY: 128 mg/dL — AB (ref 65–99)
GLUCOSE-CAPILLARY: 121 mg/dL — AB (ref 65–99)
Glucose-Capillary: 139 mg/dL — ABNORMAL HIGH (ref 65–99)

## 2015-11-01 MED ORDER — ONDANSETRON HCL 4 MG/2ML IJ SOLN
4.0000 mg | Freq: Four times a day (QID) | INTRAMUSCULAR | Status: DC | PRN
  Administered 2015-11-01 – 2015-11-02 (×3): 4 mg via INTRAVENOUS
  Filled 2015-11-01 (×3): qty 2

## 2015-11-01 MED ORDER — DIAZEPAM 5 MG PO TABS
5.0000 mg | ORAL_TABLET | Freq: Three times a day (TID) | ORAL | Status: DC | PRN
  Administered 2015-11-01: 5 mg via ORAL
  Filled 2015-11-01: qty 1

## 2015-11-01 MED ORDER — HYDROMORPHONE HCL 1 MG/ML IJ SOLN
1.0000 mg | INTRAMUSCULAR | Status: DC | PRN
  Administered 2015-11-01 (×2): 1 mg via INTRAVENOUS
  Filled 2015-11-01 (×2): qty 1

## 2015-11-01 MED ORDER — HYDROMORPHONE HCL 1 MG/ML IJ SOLN
INTRAMUSCULAR | Status: AC
  Administered 2015-11-01: 02:00:00
  Filled 2015-11-01: qty 1

## 2015-11-01 MED ORDER — HYDROMORPHONE HCL 1 MG/ML IJ SOLN
1.0000 mg | INTRAMUSCULAR | Status: DC | PRN
  Administered 2015-11-01 – 2015-11-02 (×7): 1 mg via INTRAVENOUS
  Filled 2015-11-01 (×8): qty 1

## 2015-11-01 NOTE — Progress Notes (Signed)
I was consulted by Dr.Devine to evaluate Particia Nearing.  He sustained a mechanical fall and subsequent Lisfranc fracture of his foot yesterday.  Dr. Lita Mains consulted Dr. Tonita Cong of  [facility] orthopedics according to the notes. Patient was not seen today and I was consulted  to see the patient.by Dr Charlies Silvers  His radiographs are reviewed and he indeed has a Lisfranc fracture of the forefoot which will require fixation.  His last dose of eliquis was 7/21.  I called the patient as I am currently at [facility].  I discussed the situation with he and his wife.  They have stated that someone will be by to see them from [place].Dr Charlies Silvers may or may not be aware of this communication  Their preference for this fracture fixation is Dr. Doran Durand which is a good choice  I encouraged him to reconnect with [facility] orthopedics tomorrow to make sure that the information will get to Dr. Doran Durand.  Situation currently is not acute.

## 2015-11-01 NOTE — Progress Notes (Signed)
Paged returned. Verbal orders received during Epic downtime.

## 2015-11-01 NOTE — Progress Notes (Signed)
Orthopedic Tech Progress Note Patient Details:  [name] [name] 12/30/1950 [id number]  Ortho Devices Type of Ortho Device: Ace wrap, Post (short leg) splint Ortho Device/Splint Location: footsie roll Ortho Device/Splint Interventions: Ordered, Application   Braulio Bosch 11/01/2015, 4:51 PM

## 2015-11-01 NOTE — Progress Notes (Signed)
[facility] repaged for orders regarding patient's severe pain.

## 2015-11-01 NOTE — Progress Notes (Addendum)
Patient ID: [name] [name], male   DOB: 08/20/1950, 65 y.o.   MRN: [id number]  PROGRESS NOTE    [name] [name]  [id number] DOB: April 02, 1951 DOA: 10/31/2015  PCP: Donnajean Lopes, MD   Brief Narrative:  65 year old male with past medical history of hypertension, paroxysmal atrial fibrillation on xarelto, chronic combined systolic and diastolic CHF (EF 123456 per echo 8/16), chronic anemia, CKD stage III, obstructive sleep apnea, hypothyroidism who presented from home after a mechanical fall from a ladder 6 feet below. He was subsequently found to have right foot fracture.   Assessment & Plan:   Principal Problem:   Foot fracture, right - Secondary to mechanical fall  - Orthopedics consulted - Hold xarelto (last dose 7/21) if surgery required   Active Problems:   Atrial fibrillation ([place]) on chronic anticoagulation - [place] vasc score 4 - Rate controlled with Coreg and Cardizem  Xarelto on hold in case surgery required     Obstructive sleep apnea-C-pap intol - Stable respiratory status      Essential hypertension  - Continue Coreg   Acute on  CKD (chronic kidney disease), stage III - Cr improving since admission with IV fluids, 1.88 --> 1.46    Chronic combined systolic and diastolic heart failure ([place]): EF 60-65% per echo 8/16 - Compensated     Type 2 diabetes mellitus with stage 3 chronic kidney disease (HCC) - Continue SSI    Dyslipidemia associated with type 2 DM - Continue Pravachol     Hypothyroidism - TSH WNL - Continue Synthroid  DVT prophylaxis: SCD's bilaterally, holding xarelto in case surgery required  Code Status: full code  Family Communication: no family at the bedside this am  Disposition Plan: home once seen by ortho, depending on if pt needs surgery or not    Consultants:   Ortho   Procedures:   None   Antimicrobials:   None    Subjective: No overnight events.   Objective: Vitals:   10/31/15 2149 11/01/15 0017 11/01/15  0523 11/01/15 1300  BP: (!) 122/54 122/61 136/78 119/76  Pulse: 70 66 72 72  Resp: 17 17 16 18   Temp: 98.2 F (36.8 C) 97.7 F (36.5 C) 98.1 F (36.7 C) 98 F (36.7 C)  TempSrc: Oral Oral Oral   SpO2: 99% 98%  95%    Intake/Output Summary (Last 24 hours) at 11/01/15 1522 Last data filed at 11/01/15 1300  Gross per 24 hour  Intake             2020 ml  Output             1175 ml  Net              845 ml   There were no vitals filed for this visit.  Examination:  General exam: Appears calm and comfortable  Respiratory system: Clear to auscultation. Respiratory effort normal. Cardiovascular system: S1 & S2 heard, RRR. No JVD, murmurs, rubs, gallops or clicks. No pedal edema. Gastrointestinal system: Abdomen is nondistended, soft and nontender. No organomegaly or masses felt. Normal bowel sounds heard. Central nervous system: Alert and oriented. No focal neurological deficits. Extremities: Symmetric, right foot pain Skin: No rashes, lesions or ulcers Psychiatry: Judgement and insight appear normal. Mood & affect appropriate.   Data Reviewed: I have personally reviewed following labs and imaging studies  CBC:  Recent Labs Lab 10/31/15 1330 10/31/15 1338 11/01/15 0313  WBC 8.9  --  8.4  HGB 13.1 14.3 12.1*  HCT  42.3 42.0 39.2  MCV 80.6  --  81.7  PLT 219  --  0000000   Basic Metabolic Panel:  Recent Labs Lab 10/31/15 1330 10/31/15 1338 11/01/15 0313  NA 137 142 138  K 3.8 4.0 3.4*  CL 115* 111 112*  CO2 17*  --  19*  GLUCOSE 94 89 111*  BUN 18 23* 17  CREATININE 1.76* 1.70* 1.46*  CALCIUM 9.0  --  8.6*   GFR: CrCl cannot be calculated (Unknown ideal weight.). Liver Function Tests:  Recent Labs Lab 10/31/15 1330  AST 20  ALT 16*  ALKPHOS 75  BILITOT 0.6  PROT 6.1*  ALBUMIN 3.6   No results for input(s): LIPASE, AMYLASE in the last 168 hours. No results for input(s): AMMONIA in the last 168 hours. Coagulation Profile:  Recent Labs Lab  10/31/15 1330  INR 1.88*   Cardiac Enzymes: No results for input(s): CKTOTAL, CKMB, CKMBINDEX, TROPONINI in the last 168 hours. BNP (last 3 results) No results for input(s): PROBNP in the last 8760 hours. HbA1C: No results for input(s): HGBA1C in the last 72 hours. CBG:  Recent Labs Lab 10/31/15 2122 11/01/15 0643 11/01/15 1205  GLUCAP 120* 108* 128*   Lipid Profile: No results for input(s): CHOL, HDL, LDLCALC, TRIG, CHOLHDL, LDLDIRECT in the last 72 hours. Thyroid Function Tests:  Recent Labs  10/31/15 1945  TSH 1.402   Anemia Panel: No results for input(s): VITAMINB12, FOLATE, FERRITIN, TIBC, IRON, RETICCTPCT in the last 72 hours. Urine analysis:    Component Value Date/Time   COLORURINE YELLOW 10/31/2015 2133   APPEARANCEUR CLEAR 10/31/2015 2133   LABSPEC 1.027 10/31/2015 2133   PHURINE 5.5 10/31/2015 2133   GLUCOSEU >1000 (A) 10/31/2015 2133   HGBUR NEGATIVE 10/31/2015 2133   [place] NEGATIVE 10/31/2015 2133   KETONESUR 15 (A) 10/31/2015 2133   PROTEINUR NEGATIVE 10/31/2015 2133   UROBILINOGEN 1.0 11/22/2014 1337   NITRITE NEGATIVE 10/31/2015 2133   LEUKOCYTESUR NEGATIVE 10/31/2015 2133   Sepsis Labs: @LABRCNTIP (procalcitonin:4,lacticidven:4)   )No results found for this or any previous visit (from the past 240 hour(s)).    Radiology Studies: Ct Abdomen Pelvis Wo Contrast Result Date: 10/31/2015 CLINICAL DATA:  1. Nonobstructive bilateral renal stones. 2. Atherosclerosis in the abdominal aorta. 3. 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 Minimal increased attenuation in the fat adjacent to the distal descending colon could represent mild diverticulitis in the appropriate clinical setting. 4. A small 14 mm cyst in the left lateral pelvis is of doubtful significance. This could represent a lymphocele or duplication cyst. 5. The right bladder dome herniates into a right inguinal hernia. This is not of acute significance and likely chronic. 6. 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 Electronically Signed   By: Dorise Bullion III M.D   On: 10/31/2015 15:32   Dg Tibia/fibula Right Result Date: 10/31/2015 CLINICAL DATA:  Negative. Electronically Signed   By: Franki Cabot M.D.   On: 10/31/2015 16:18   Ct Head Wo Contrast Result Date: 10/31/2015 CLINICAL DATA:  No acute intracranial process. No acute cervical spine fracture.   Ct Chest Wo Contrast Result Date: 10/31/2015 CLINICAL DATA:  1. Nonobstructive bilateral renal stones. 2. Atherosclerosis in the abdominal aorta. 3. 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 Minimal increased attenuation in the fat adjacent to the distal descending colon could represent mild diverticulitis in the appropriate clinical setting. 4. A small 14 mm cyst in the left lateral pelvis is of doubtful significance. This could represent a lymphocele or duplication cyst. 5. The  right bladder dome herniates into a right inguinal hernia. This is not of acute significance and likely chronic. 6. 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 Electronically Signed   By: Dorise Bullion III M.D   On: 10/31/2015 15:32   Ct Cervical Spine Wo Contrast Result Date: 10/31/2015 CLINICAL DATA:   No acute intracranial process. No acute cervical spine fracture.   Ct Foot Right Wo Contrast Result Date: 10/31/2015 CLINICAL DATA:  Findings consistent with a Lisfranc injury with chip fractures off the second, third and fourth metatarsals and lateral cuneiform as described above. The Lisfranc ligament is torn. Evaluation of ligaments on CT scan is limited.   Dg Chest Port 1 View Result Date: 10/31/2015 CLINICAL DATA: No active disease.  Dg Foot Complete Right Result Date: 10/31/2015 CLINICAL DATA:   1. Lis franc injuries at the bases of the first and second metatarsals with malalignment in addition to tiny bony fragments. Recommend a CT scan for better evaluation.      Scheduled Meds: . carvedilol  12.5 mg Oral BID  . DULoxetine  30 mg Oral Daily  . ferrous sulfate   325 mg Oral Q breakfast  . folic acid  1 mg Oral Daily  . imipramine  50 mg Oral QHS  . insulin aspart  0-5 Units Subcutaneous QHS  . insulin aspart  0-9 Units Subcutaneous TID WC  . levothyroxine  150 mcg Oral QAC breakfast  . pantoprazole  40 mg Oral Daily  . polycarbophil  1,250 mg Oral Daily  . potassium chloride SA  40 mEq Oral BID  . pravastatin  10 mg Oral QHS  . senna  1 tablet Oral BID   Continuous Infusions: . sodium chloride 75 mL/hr at 10/31/15 1918     LOS: 1 day    Time spent: 15 minutes  Greater than 50% of the time spent on counseling and coordinating the care.   Leisa Lenz, MD [facility] Pager [phone]  If 7PM-7AM, please contact night-coverage [email or web address] Password [facility] 11/01/2015, 3:22 PM

## 2015-11-01 NOTE — Progress Notes (Signed)
RN paged [facility] regarding patient's lack of pain relief with Morphine 1mg  IV. Awaiting return call or orders.

## 2015-11-01 NOTE — Progress Notes (Signed)
Paged Dr Ellin Goodie asking for something else to help with spasms in his foot, new orders recieved

## 2015-11-01 NOTE — Progress Notes (Signed)
Paged Dr Charlies Silvers, patient asking to see [place], spoke to Dr Carlean Jews group and they said to have Dr Charlies Silvers call Dr [facility]

## 2015-11-02 ENCOUNTER — Encounter (HOSPITAL_COMMUNITY): Payer: Self-pay | Admitting: Physician Assistant

## 2015-11-02 DIAGNOSIS — I10 Essential (primary) hypertension: Secondary | ICD-10-CM

## 2015-11-02 DIAGNOSIS — I480 Paroxysmal atrial fibrillation: Secondary | ICD-10-CM

## 2015-11-02 DIAGNOSIS — Z7901 Long term (current) use of anticoagulants: Secondary | ICD-10-CM

## 2015-11-02 DIAGNOSIS — I5042 Chronic combined systolic (congestive) and diastolic (congestive) heart failure: Secondary | ICD-10-CM | POA: Diagnosis present

## 2015-11-02 DIAGNOSIS — S92901S Unspecified fracture of right foot, sequela: Secondary | ICD-10-CM

## 2015-11-02 LAB — GLUCOSE, CAPILLARY
GLUCOSE-CAPILLARY: 108 mg/dL — AB (ref 65–99)
GLUCOSE-CAPILLARY: 140 mg/dL — AB (ref 65–99)
GLUCOSE-CAPILLARY: 144 mg/dL — AB (ref 65–99)
Glucose-Capillary: 127 mg/dL — ABNORMAL HIGH (ref 65–99)
Glucose-Capillary: 150 mg/dL — ABNORMAL HIGH (ref 65–99)

## 2015-11-02 LAB — CBC
HEMATOCRIT: 37.2 % — AB (ref 39.0–52.0)
HEMOGLOBIN: 11.6 g/dL — AB (ref 13.0–17.0)
MCH: 25.3 pg — AB (ref 26.0–34.0)
MCHC: 31.2 g/dL (ref 30.0–36.0)
MCV: 81.2 fL (ref 78.0–100.0)
Platelets: 190 10*3/uL (ref 150–400)
RBC: 4.58 MIL/uL (ref 4.22–5.81)
RDW: 21.5 % — ABNORMAL HIGH (ref 11.5–15.5)
WBC: 8.5 10*3/uL (ref 4.0–10.5)

## 2015-11-02 LAB — BASIC METABOLIC PANEL
Anion gap: 7 (ref 5–15)
BUN: 14 mg/dL (ref 6–20)
CO2: 19 mmol/L — ABNORMAL LOW (ref 22–32)
Calcium: 8.5 mg/dL — ABNORMAL LOW (ref 8.9–10.3)
Chloride: 109 mmol/L (ref 101–111)
Creatinine, Ser: 1.28 mg/dL — ABNORMAL HIGH (ref 0.61–1.24)
GFR, EST NON AFRICAN AMERICAN: 57 mL/min — AB (ref >60)
Glucose, Bld: 103 mg/dL — ABNORMAL HIGH (ref 65–99)
POTASSIUM: 3.8 mmol/L (ref 3.5–5.1)
SODIUM: 135 mmol/L (ref 135–145)

## 2015-11-02 LAB — HEMOGLOBIN A1C
HEMOGLOBIN A1C: 6.7 % — AB (ref 4.8–5.6)
MEAN PLASMA GLUCOSE: 146 mg/dL

## 2015-11-02 MED ORDER — CEFAZOLIN SODIUM-DEXTROSE 2-4 GM/100ML-% IV SOLN
2.0000 g | INTRAVENOUS | Status: AC
  Administered 2015-11-03: 2 g via INTRAVENOUS
  Filled 2015-11-02: qty 100

## 2015-11-02 MED ORDER — METHOCARBAMOL 500 MG PO TABS
500.0000 mg | ORAL_TABLET | Freq: Four times a day (QID) | ORAL | Status: DC | PRN
  Administered 2015-11-02 – 2015-11-03 (×3): 500 mg via ORAL
  Filled 2015-11-02 (×3): qty 1

## 2015-11-02 MED ORDER — POVIDONE-IODINE 10 % EX SWAB: 2.0000 "application " | Freq: Once | CUTANEOUS | Status: DC

## 2015-11-02 MED ORDER — NAPHAZOLINE-GLYCERIN 0.012-0.2 % OP SOLN: 1.0000 [drp] | Freq: Four times a day (QID) | OPHTHALMIC | Status: DC | PRN

## 2015-11-02 MED ORDER — SODIUM CHLORIDE 0.45 % IV SOLN
INTRAVENOUS | Status: DC
  Administered 2015-11-02 – 2015-11-03 (×2): via INTRAVENOUS

## 2015-11-02 MED ORDER — NAPHAZOLINE-GLYCERIN 0.012-0.2 % OP SOLN
1.0000 [drp] | Freq: Four times a day (QID) | OPHTHALMIC | Status: DC | PRN
  Administered 2015-11-02: 2 [drp] via OPHTHALMIC
  Filled 2015-11-02: qty 15

## 2015-11-02 NOTE — Progress Notes (Addendum)
Patient ID: [name] [name], male   DOB: 1950-08-23, 65 y.o.   MRN: [id number]  PROGRESS NOTE    [name] [name]  [id number] DOB: Mar 26, 1951 DOA: 10/31/2015  PCP: Donnajean Lopes, MD   Brief Narrative:  65 year old male with past medical history of hypertension, paroxysmal atrial fibrillation on xarelto, chronic combined systolic and diastolic CHF (EF 123456 per echo 8/16), chronic anemia, CKD stage III, obstructive sleep apnea, hypothyroidism who presented from home after a mechanical fall from a ladder 6 feet below. He was subsequently found to have right foot fracture.   Assessment & Plan:   Principal Problem:   Foot fracture, right - Secondary to mechanical fall  - Orthopedics consulted, we appreciate their input  - Hold xarelto (last dose 7/21) if surgery required   Active Problems:   Atrial fibrillation (HCC) on chronic anticoagulation - [place] vasc score 4 - Rate controlled with Coreg and Cardizem  - Xarelto on hold in case surgery required     Obstructive sleep apnea-C-pap intol - Stable      Essential hypertension  - Continue Coreg     Acute on  CKD (chronic kidney disease), stage III - Cr improving since admission with IV fluids, 1.88 --> 1.46    Chronic combined systolic and diastolic heart failure ([place]): EF 60-65% per echo 8/16 - Compensated     Type 2 diabetes mellitus with stage 3 chronic kidney disease (HCC) - Continue SSI - CBG's in past 24 hours: 139, 108, 140    Dyslipidemia associated with type 2 DM - Continue Pravachol     Hypothyroidism - TSH WNL - Continue Synthroid    Hypokalemia - Supplemented and normalized   DVT prophylaxis: SCD's bilaterally, holding xarelto in case surgery required  Code Status: full code  Family Communication: no family at the bedside this am  Disposition Plan: needs to be seen by ortho, home once we know if he will have surgery or not    Consultants:   Ortho   Procedures:   None   Antimicrobials:    None    Subjective: No overnight events.   Objective: Vitals:   11/01/15 1300 11/01/15 2003 11/02/15 0130 11/02/15 0626  BP: 119/76 (!) 150/79 137/77 (!) 147/72  Pulse: 72 68 85 82  Resp: 18     Temp: 98 F (36.7 C) 98.3 F (36.8 C) 98.3 F (36.8 C) 98 F (36.7 C)  TempSrc:  Oral Oral Oral  SpO2: 95% 98% 98% 95%    Intake/Output Summary (Last 24 hours) at 11/02/15 1204 Last data filed at 11/02/15 0900  Gross per 24 hour  Intake              950 ml  Output             1800 ml  Net             -850 ml   There were no vitals filed for this visit.  Examination:  General exam: Appears calm, no distress  Respiratory system: No wheezing, no rhonchi  Cardiovascular system: S1 & S2 heard, Rate controlled  Gastrointestinal system: (+) BS, non tender  Central nervous system: No focal neurological deficits. Extremities: right foot with ace wrapping, swollen, right foot with no swelling  Skin: warm, dry  Psychiatry:  Mood & affect appropriate.   Data Reviewed: I have personally reviewed following labs and imaging studies  CBC:  Recent Labs Lab 10/31/15 1330 10/31/15 1338 11/01/15 0313 11/02/15 0329  WBC 8.9  --  8.4 8.5  HGB 13.1 14.3 12.1* 11.6*  HCT 42.3 42.0 39.2 37.2*  MCV 80.6  --  81.7 81.2  PLT 219  --  [id number]   Basic Metabolic Panel:  Recent Labs Lab 10/31/15 1330 10/31/15 1338 11/01/15 0313 11/02/15 0329  NA 137 142 138 135  K 3.8 4.0 3.4* 3.8  CL 115* 111 112* 109  CO2 17*  --  19* 19*  GLUCOSE 94 89 111* 103*  BUN 18 23* 17 14  CREATININE 1.76* 1.70* 1.46* 1.28*  CALCIUM 9.0  --  8.6* 8.5*   GFR: CrCl cannot be calculated (Unknown ideal weight.). Liver Function Tests:  Recent Labs Lab 10/31/15 1330  AST 20  ALT 16*  ALKPHOS 75  BILITOT 0.6  PROT 6.1*  ALBUMIN 3.6   No results for input(s): LIPASE, AMYLASE in the last 168 hours. No results for input(s): AMMONIA in the last 168 hours. Coagulation Profile:  Recent Labs Lab  10/31/15 1330  INR 1.88*   Cardiac Enzymes: No results for input(s): CKTOTAL, CKMB, CKMBINDEX, TROPONINI in the last 168 hours. BNP (last 3 results) No results for input(s): PROBNP in the last 8760 hours. HbA1C:  Recent Labs  10/31/15 1900  HGBA1C 6.7*   CBG:  Recent Labs Lab 11/01/15 1205 11/01/15 1612 11/01/15 2225 11/02/15 0627 11/02/15 0909  GLUCAP 128* 121* 139* 108* 140*   Lipid Profile: No results for input(s): CHOL, HDL, LDLCALC, TRIG, CHOLHDL, LDLDIRECT in the last 72 hours. Thyroid Function Tests:  Recent Labs  10/31/15 1945  TSH 1.402   Anemia Panel: No results for input(s): VITAMINB12, FOLATE, FERRITIN, TIBC, IRON, RETICCTPCT in the last 72 hours. Urine analysis:    Component Value Date/Time   COLORURINE YELLOW 10/31/2015 2133   APPEARANCEUR CLEAR 10/31/2015 2133   LABSPEC 1.027 10/31/2015 2133   PHURINE 5.5 10/31/2015 2133   GLUCOSEU >1000 (A) 10/31/2015 2133   HGBUR NEGATIVE 10/31/2015 2133   [place] NEGATIVE 10/31/2015 2133   KETONESUR 15 (A) 10/31/2015 2133   PROTEINUR NEGATIVE 10/31/2015 2133   UROBILINOGEN 1.0 11/22/2014 1337   NITRITE NEGATIVE 10/31/2015 2133   LEUKOCYTESUR NEGATIVE 10/31/2015 2133   Sepsis Labs: @LABRCNTIP (procalcitonin:4,lacticidven:4)   )No results found for this or any previous visit (from the past 240 hour(s)).    Radiology Studies: Ct Abdomen Pelvis Wo Contrast Result Date: 10/31/2015 CLINICAL DATA:  1. Nonobstructive bilateral renal stones. 2. Atherosclerosis in the abdominal aorta. 3. 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 Minimal increased attenuation in the fat adjacent to the distal descending colon could represent mild diverticulitis in the appropriate clinical setting. 4. A small 14 mm cyst in the left lateral pelvis is of doubtful significance. This could represent a lymphocele or duplication cyst. 5. The right bladder dome herniates into a right inguinal hernia. This is not of acute significance  and likely chronic. 6. 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 Electronically Signed   By: Dorise Bullion III M.D   On: 10/31/2015 15:32   Dg Tibia/fibula Right Result Date: 10/31/2015 CLINICAL DATA:  Negative. Electronically Signed   By: Franki Cabot M.D.   On: 10/31/2015 16:18   Ct Head Wo Contrast Result Date: 10/31/2015 CLINICAL DATA:  No acute intracranial process. No acute cervical spine fracture.   Ct Chest Wo Contrast Result Date: 10/31/2015 CLINICAL DATA:  1. Nonobstructive bilateral renal stones. 2. Atherosclerosis in the abdominal aorta. 3. 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 Minimal increased attenuation in the fat adjacent to the distal descending colon could represent mild diverticulitis  in the appropriate clinical setting. 4. A small 14 mm cyst in the left lateral pelvis is of doubtful significance. This could represent a lymphocele or duplication cyst. 5. The right bladder dome herniates into a right inguinal hernia. This is not of acute significance and likely chronic. 6. 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 Electronically Signed   By: Dorise Bullion III M.D   On: 10/31/2015 15:32   Ct Cervical Spine Wo Contrast Result Date: 10/31/2015 CLINICAL DATA:   No acute intracranial process. No acute cervical spine fracture.   Ct Foot Right Wo Contrast Result Date: 10/31/2015 CLINICAL DATA:  Findings consistent with a Lisfranc injury with chip fractures off the second, third and fourth metatarsals and lateral cuneiform as described above. The Lisfranc ligament is torn. Evaluation of ligaments on CT scan is limited.   Dg Chest Port 1 View Result Date: 10/31/2015 CLINICAL DATA: No active disease.  Dg Foot Complete Right Result Date: 10/31/2015 CLINICAL DATA:   1. Lis franc injuries at the bases of the first and second metatarsals with malalignment in addition to tiny bony fragments. Recommend a CT scan for better evaluation.      Scheduled Meds: . carvedilol  12.5 mg Oral BID  . DULoxetine  30  mg Oral Daily  . ferrous sulfate  325 mg Oral Q breakfast  . folic acid  1 mg Oral Daily  . imipramine  50 mg Oral QHS  . insulin aspart  0-5 Units Subcutaneous QHS  . insulin aspart  0-9 Units Subcutaneous TID WC  . levothyroxine  150 mcg Oral QAC breakfast  . pantoprazole  40 mg Oral Daily  . polycarbophil  1,250 mg Oral Daily  . potassium chloride SA  40 mEq Oral BID  . pravastatin  10 mg Oral QHS  . senna  1 tablet Oral BID   Continuous Infusions: . sodium chloride Stopped (11/02/15 0600)     LOS: 2 days    Time spent: 15 minutes  Greater than 50% of the time spent on counseling and coordinating the care.   Leisa Lenz, MD [facility] Pager [phone]  If 7PM-7AM, please contact night-coverage [email or web address] Password [facility] 11/02/2015, 12:04 PM

## 2015-11-02 NOTE — Progress Notes (Signed)
Spoke to [place] at Dr Jess Barters office, she stated Dr Jess Barters aware that patient is asking for him to do the surgery and he will see him tonight

## 2015-11-02 NOTE — Progress Notes (Signed)
Pt seen  [facility] or Sharol Given will take care of this Tuesday or wednesday

## 2015-11-02 NOTE — Progress Notes (Signed)
Called [facility] office,left message with Dr Forbes Cellar nurse, Dr Marlou Sa had been in to see patient this am and explained that Dr Sharol Given could do patients surgery,  Patient and family wanted time to discuss  which orthopedic physician they wanted to do patients surgery, patient has decided to see Dr. Sharol Given for surgery.

## 2015-11-02 NOTE — Consult Note (Signed)
CARDIOLOGY CONSULT NOTE   Patient ID: [name] [name] MRN: [id number] DOB/AGE: 65-Jul-1952 65 y.o.  Admit date: 10/31/2015  Primary Physician   Donnajean Lopes, MD Primary Cardiologist   Dr. Irish Lack EP: Dr. Alphonsa Overall: Dr. Lehman Prom (released)  Reason for Consultation   Anticoagulation management  Requesting Physician  Dr. Charlies Silvers  HPI: [name] [name] is a 65 y.o. male with a history of PAF s/p 2 ablation and previously failed medical therpay, DM, HTN, OSA who presented 10/31/15 after mechanical fall.   He underwent sub xyphoid ablation in November 2016  with Dr. Lehman Prom and Dr. Danise Mina at [facility]. The patient's post op course was complicated by prolonged wound drainage from his incision/ drain site that persisted for several weeks following discharge. Required abx for few times for ongoing infection subsequently underwent wound debridement on 06/04/2015 at [facility].  Initially the wound healed but infection returned. He was again taken to the OR  for wound debridement and wound vac placement following sub-xyphoid maze procedure on 07/08/2015 with resolution of symptoms. No reoccurrence. Maintaining sinus rhythm since 02/2015. He was released from [facility] with return f/uw with Dr. Irish Lack.   The patient was in [place] until his mechanical fall. He was power washing his house and fell approximately 6-7 feet. No prodrome. Work up reveled R Lisfranc disruption and seen by orthopedic today and plan for sent home on Lovenox and possible surgery in 7-10 days. Last dose of Xarelto PM of 10/30/15. Sinus rhythm of telemetry and EKG. The patient denies nausea, vomiting, fever, chest pain, palpitations, shortness of breath, orthopnea, PND, dizziness, syncope, cough, congestion, abdominal pain, hematochezia, melena, lower extremity edema. Cardiology asked for short and long term anticoagulation management.   Past Medical History:  Diagnosis Date  . Anemia   . Atrial flutter ([place])    a. s/p RFCA 8/13  . Chronic  combined systolic and diastolic CHF (congestive heart failure) ([place])    a. LVEF previously 35% felt to be due tachycardia;  b. 02/2013 Echo: EF 50-55%, no rwma, mildly dil LA/RA, mild to mod MR. C 11/2014 echo - ef 60-65%, unable to determine DD  . CKD (chronic kidney disease), stage III    hx of a/c renal failure during episode of diverticulitis 9/13 in [place], [place]  . Diabetes mellitus ([place])   . Diverticulosis   . Erectile dysfunction   . Gastric ulcer    s/p prior surgery  . Hypertension   . Hypothyroid   . Obesities, morbid ([place])   . Obstructive sleep apnea    noncompliant with CPAP  . PAF (paroxysmal atrial fibrillation) ([place])    a. failed DCCV and multiple anti-arrhythmic drugs (multaq/flecainide);   b. s/p  PVI isolation with ablation of AFib 8/13; c. repeat PVI 01/2013;  d. 02/2013 repeat DCCV->Amio load/xarelto.  . Renal calculi      Past Surgical History:  Procedure Laterality Date  . ATRIAL FIBRILLATION ABLATION  12/06/11; 02/05/2013   Afib and atrial flutter ablation by Dr Rayann Heman; repeat PVI by Dr Rayann Heman 02/05/2013  . ATRIAL FIBRILLATION ABLATION N/A 12/06/2011   Procedure: ATRIAL FIBRILLATION ABLATION;  Surgeon: Thompson Grayer, MD;  Location: [facility] CATH LAB;  Service: Cardiovascular;  Laterality: N/A;  . ATRIAL FIBRILLATION ABLATION N/A 02/05/2013   Procedure: ATRIAL FIBRILLATION ABLATION;  Surgeon: Coralyn Mark, MD;  Location: [place] CATH LAB;  Service: Cardiovascular;  Laterality: N/A;  . benign stomach tumor removal  2000  . CARDIOVERSION  08/25/2011   Procedure: CARDIOVERSION;  Surgeon: Conception Oms  Hassell Done, MD;  Location: [facility] ENDOSCOPY;  Service: Cardiovascular;  Laterality: N/A;  . CARDIOVERSION N/A 03/05/2013   Procedure: CARDIOVERSION;  Surgeon: Sinclair Grooms, MD;  Location: [place];  Service: Cardiovascular;  Laterality: N/A;  BEDSIDE   . convergent afib abaltion [facility] 02/26/15  02/26/15   [facility] by Dr. Lehman Prom and Dr. Danise Mina  . ELECTROPHYSIOLOGIC STUDY N/A 11/24/2014    Procedure: Cardioversion;  Surgeon: Will Meredith Leeds, MD;  Location: [place] CV LAB;  Service: Cardiovascular;  Laterality: N/A;  . ESOPHAGOGASTRODUODENOSCOPY N/A 04/09/2013   Procedure: ESOPHAGOGASTRODUODENOSCOPY (EGD) with possible Balloon Dilation;  Surgeon: Garlan Fair, MD;  Location: [facility] ENDOSCOPY;  Service: Endoscopy;  Laterality: N/A;  . kidney stone removal     multiple  . knee sx  1985   both  . LAPAROSCOPIC RETROPUBIC PROSTATECTOMY  02/2007   hx prostate cancer  . TEE WITHOUT CARDIOVERSION  08/25/2011   Procedure: TRANSESOPHAGEAL ECHOCARDIOGRAM (TEE);  Surgeon: Jettie Booze, MD;  Location: [place];  Service: Cardiovascular;  Laterality: N/A;  . TEE WITHOUT CARDIOVERSION  11/09/2011   Procedure: TRANSESOPHAGEAL ECHOCARDIOGRAM (TEE);  Surgeon: Jettie Booze, MD;  Location: [facility] ENDOSCOPY;  Service: Cardiovascular;  Laterality: N/A;  h/p in file drawer  . TEE WITHOUT CARDIOVERSION N/A 02/05/2013   Procedure: TRANSESOPHAGEAL ECHOCARDIOGRAM (TEE);  Surgeon: Candee Furbish, MD;  Location: [facility] ENDOSCOPY;  Service: Cardiovascular;  Laterality: N/A;  . THYROIDECTOMY  2009   cysts removal   . wrist sx  1980   right    Allergies  Allergen Reactions  . Phenergan [Promethazine Hcl] Itching    Hallucination   . Aspirin     Hx of ulcer   . Bystolic [Nebivolol Hcl] Swelling    Bradycardia   . Calcium Channel Blockers Swelling    LE edema  . Prednisone     Hx of ulcer  . Nsaids     Hx ulcer    I have reviewed the patient's current medications . carvedilol  12.5 mg Oral BID  . DULoxetine  30 mg Oral Daily  . ferrous sulfate  325 mg Oral Q breakfast  . folic acid  1 mg Oral Daily  . imipramine  50 mg Oral QHS  . insulin aspart  0-5 Units Subcutaneous QHS  . insulin aspart  0-9 Units Subcutaneous TID WC  . levothyroxine  150 mcg Oral QAC breakfast  . pantoprazole  40 mg Oral Daily  . polycarbophil  1,250 mg Oral Daily  . potassium chloride SA  40 mEq  Oral BID  . pravastatin  10 mg Oral QHS  . senna  1 tablet Oral BID   . sodium chloride Stopped (11/02/15 0600)   albuterol, diltiazem, HYDROcodone-acetaminophen, HYDROmorphone (DILAUDID) injection, methocarbamol, naphazoline-glycerin, ondansetron (ZOFRAN) IV, polyethylene glycol  Prior to Admission medications   Medication Sig Start Date End Date Taking? Authorizing Provider  albuterol (PROVENTIL) (2.5 MG/3ML) 0.083% nebulizer solution Take 2.5 mg by nebulization 2 (two) times daily as needed for wheezing or shortness of breath.  05/22/13  Yes Historical Provider, MD  Canagliflozin (INVOKANA) 300 MG TABS Take 1 tablet by mouth daily.   Yes Historical Provider, MD  carvedilol (COREG) 12.5 MG tablet Take 1.5 tablets (18.75 mg total) by mouth 2 (two) times daily. Patient taking differently: Take 12.5 mg by mouth 2 (two) times daily.  05/22/15  Yes Thompson Grayer, MD  Cholecalciferol (VITAMIN D) 2000 UNITS CAPS Take 2,000 Units by mouth daily.   Yes Historical Provider, MD  diltiazem (  CARDIZEM) 30 MG tablet Take 1 tablet (30 mg total) by mouth as needed (Every 4 hours AS NEEDED with a HR 123XX123 and Systolic 123XX123 (BP)). [id number]  Yes Sherran Needs, NP  DULoxetine (CYMBALTA) 30 MG capsule Take 1 capsule by mouth every morning. 10/23/15  Yes Historical Provider, MD  ferrous sulfate 325 (65 FE) MG tablet Take 1 tablet (325 mg total) by mouth daily with breakfast. 11/24/14  Yes Erlene Quan, PA-C  folic acid (FOLVITE) 1 MG tablet Take 1 mg by mouth daily.   Yes Historical Provider, MD  GLUCOSAMINE PO Take 1,000 mg by mouth 2 (two) times daily.    Yes Historical Provider, MD  imipramine (TOFRANIL) 25 MG tablet Take 50 mg by mouth at bedtime.    Yes Historical Provider, MD  levothyroxine (SYNTHROID, LEVOTHROID) 150 MCG tablet Take 150 mcg by mouth daily before breakfast.   Yes Historical Provider, MD  Multiple Vitamins-Minerals (MENS 50+ MULTI VITAMIN/MIN PO) Take 1 tablet by mouth daily.   Yes Historical  Provider, MD  pantoprazole (PROTONIX) 40 MG tablet Take 40 mg by mouth daily.    Yes Historical Provider, MD  polycarbophil (FIBERCON) 625 MG tablet Take 1,250 mg by mouth daily.   Yes Historical Provider, MD  potassium chloride SA (K-DUR,KLOR-CON) 20 MEQ tablet Take 2 tablets (40 mEq total) by mouth daily. Patient taking differently: Take 40 mEq by mouth 2 (two) times daily.  11/24/14  Yes Luke K Kilroy, PA-C  pravastatin (PRAVACHOL) 20 MG tablet Take 10 mg by mouth at bedtime.    Yes Historical Provider, MD  Rivaroxaban (XARELTO) 15 MG TABS tablet Take 1 tablet (15 mg total) by mouth every evening. 05/22/15  Yes Thompson Grayer, MD     Social History   Social History  . Marital status: Married    Spouse name: N/A  . Number of children: N/A  . Years of education: N/A   Occupational History  . Not on file.   Social History Main Topics  . Smoking status: Never Smoker  . Smokeless tobacco: Not on file  . Alcohol use No  . Drug use: No  . Sexual activity: Yes   Other Topics Concern  . Not on file   Social History Narrative   Lives in [place].  Works as a Librarian, academic at [employer] Status  Relation Status  . Mother Deceased  . Father Deceased  . Brother Alive   Family History  Problem Relation Age of Onset  . Emphysema Mother   . Lung cancer Father   . Diabetes Brother   . Colon cancer Neg Hx       ROS:  Full 14 point review of systems complete and found to be negative unless listed above.  Physical Exam: Blood pressure (!) 147/72, pulse 82, temperature 98 F (36.7 C), temperature source Oral, resp. rate 18, SpO2 95 %.  General: Well developed, well nourished, male in no acute distress Head: Eyes PERRLA, No xanthomas. Normocephalic and atraumatic, oropharynx without edema or exudate.  Lungs: Resp regular and unlabored, CTA. Heart: RRR no s3, s4, or murmurs. Neck: No carotid bruits. No lymphadenopathy.  No JVD. Abdomen: Bowel sounds present, abdomen  soft and non-tender without masses or hernias noted. Msk:  No spine or cva tenderness. No weakness, no joint deformities or effusions. Extremities: No clubbing, cyanosis . R food swollen and ACE wrap. DP/PT/Radials 2+ and equal bilaterally. Neuro: Alert and oriented X 3. No focal deficits noted.  Psych:  Good affect, responds appropriately Skin: No rashes or lesions noted.  Labs:   Lab Results  Component Value Date   WBC 8.5 11/02/2015   HGB 11.6 (L) 11/02/2015   HCT 37.2 (L) 11/02/2015   MCV 81.2 11/02/2015   PLT 190 11/02/2015    Recent Labs  10/31/15 1330  INR 1.88*    Recent Labs Lab 10/31/15 1330  11/02/15 0329  NA 137  < > 135  K 3.8  < > 3.8  CL 115*  < > 109  CO2 17*  < > 19*  BUN 18  < > 14  CREATININE 1.76*  < > 1.28*  CALCIUM 9.0  < > 8.5*  PROT 6.1*  --   --   BILITOT 0.6  --   --   ALKPHOS 75  --   --   ALT 16*  --   --   AST 20  --   --   GLUCOSE 94  < > 103*  ALBUMIN 3.6  --   --   < > = values in this interval not displayed. Magnesium  Date Value Ref Range Status  03/05/2013 1.9 1.5 - 2.5 mg/dL Final   No results for input(s): CKTOTAL, CKMB, TROPONINI in the last 72 hours. No results for input(s): TROPIPOC in the last 72 hours. Pro B Natriuretic peptide (BNP)  Date/Time Value Ref Range Status  03/05/2013 07:30 AM 1,099.0 (H) 0 - 125 pg/mL Final  03/04/2013 09:46 PM 1,535.0 (H) 0 - 125 pg/mL Final   Lab Results  Component Value Date   CHOL 181 07/24/2013   HDL 52 07/24/2013   LDLCALC 109 (H) 07/24/2013   TRIG 99 07/24/2013   No results found for: DDIMER No results found for: LIPASE, AMYLASE TSH  Date/Time Value Ref Range Status  10/31/2015 07:45 PM 1.402 0.350 - 4.500 uIU/mL Final  05/06/2013 01:02 PM 7.75 (H) 0.35 - 5.50 uIU/mL Final   Ferritin  Date/Time Value Ref Range Status  01/31/2013 01:19 PM 12.5 (L) 22.0 - 322.0 ng/mL Final   Iron  Date/Time Value Ref Range Status  01/31/2013 01:19 PM 27 (L) 42 - 165 ug/dL Final    01/31/2013 01:19 PM 27 (L) 42 - 165 ug/dL Final    Echo: 11/24/2014 LV EF: 60% -   65%  ------------------------------------------------------------------- Indications:      Atrial fibrillation - 427.31.  ------------------------------------------------------------------- History:   PMH:   Atrial fibrillation.  Atrial flutter.  Risk factors:  Hypertension. Diabetes mellitus.  ------------------------------------------------------------------- Study Conclusions  - Left ventricle: The cavity size was normal. Wall thickness was   increased in a pattern of mild LVH. Systolic function was normal.   The estimated ejection fraction was in the range of 60% to 65%.   Indeterminant diastolic function (tachycardic). Although no   diagnostic regional wall motion abnormality was identified, this   possibility cannot be completely excluded on the basis of this   study. - Aortic valve: There was no stenosis. - Aorta: Mildly dilated aortic root. Aortic root dimension: 38 mm   (ED). - Mitral valve: There was no significant regurgitation. - Left atrium: The atrium was mildly dilated. - Right ventricle: The cavity size was normal. Systolic function   was normal. - Pulmonary arteries: No complete TR doppler jet so unable to   estimate PA systolic pressure.  Impressions:  - The patient was tachycardic (sinus tachy versus atrial flutter).   Normal LV size with mild LV hypertrophy. EF 60-65%. Normal RV  size and systolic function. No significant valvular   abnormalities.  ECG:  Sinus rhythm Vent. rate 77 BPM PR interval * ms QRS duration 97 ms QT/QTc 382/433 ms P-R-T axes 23 -29 61  Radiology:  Ct Abdomen Pelvis Wo Contrast  Result Date: 10/31/2015 CLINICAL DATA:  Pain after trauma. EXAM: CT CHEST, ABDOMEN AND PELVIS WITHOUT CONTRAST TECHNIQUE: Multidetector CT imaging of the chest, abdomen and pelvis was performed following the standard protocol without IV contrast. COMPARISON:  CT  of the chest January 08, 2011 FINDINGS: CT CHEST The central airways are normal. No pneumothorax. No pulmonary not, masses, or focal infiltrates. The thoracic aorta is normal in caliber measuring 3.9 cm. No aneurysm. The central pulmonary arteries are normal in caliber as well. There are coronary artery calcifications. The heart size is normal. No effusions. No adenopathy. CT ABDOMEN AND PELVIS No free air or free fluid. Evaluation of parenchymal organs is limited without contrast. The liver, gallbladder, spleen, adrenal glands, and pancreas are unremarkable. There is fatty deposition within the pancreatic head. Bilateral renal stones are seen, most numerous in the left lower pole. No hydronephrosis or perinephric stranding. No ureterectasis or ureteral stones. Atherosclerosis is seen in the abdominal aorta which is non aneurysmal. No adenopathy. A suture line is associated with the stomach consistent previous gastric surgery. The stomach and small bowel are otherwise normal. The colon demonstrates colonic diverticuli. Slight increased attenuation in the fat adjacent to the distal descending colon could represent very subtle diverticulitis. The remainder of the colon is normal. The appendix is unremarkable. The pelvis demonstrates a small cyst on the left on series 7, image 246 measuring 14 mm. This is of doubtful acute significance. No suspicious adenopathy or mass. The right bladder dome herniates into a right inguinal hernia. The remainder of the bladder is normal. No other abnormalities in the pelvis. Subcortical sclerosis in the femoral head suggests small regions of DVT and. No acute bony abnormalities. There is a fat containing umbilical hernia. Another fat containing hernia is seen anteriorly, above the umbilicus. IMPRESSION: 1. Nonobstructive bilateral renal stones. 2. Atherosclerosis in the abdominal aorta. 3. 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 Minimal increased attenuation in the fat adjacent  to the distal descending colon could represent mild diverticulitis in the appropriate clinical setting. 4. A small 14 mm cyst in the left lateral pelvis is of doubtful significance. This could represent a lymphocele or duplication cyst. 5. The right bladder dome herniates into a right inguinal hernia. This is not of acute significance and likely chronic. 6. 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 Electronically Signed   By: Dorise Bullion III M.D   On: 10/31/2015 15:32   Dg Tibia/fibula Right  Result Date: 10/31/2015 CLINICAL DATA:  Distal tib-fib pain after fall from ladder this afternoon. History of right ankle fracture. EXAM: RIGHT TIBIA AND FIBULA - 2 VIEW COMPARISON:  None. FINDINGS: There is no evidence of fracture or other focal bone lesions. Soft tissues are unremarkable. IMPRESSION: Negative. Electronically Signed   By: Franki Cabot M.D.   On: 10/31/2015 16:18   Ct Head Wo Contrast  Result Date: 10/31/2015 CLINICAL DATA:  Patient status post fall from ladder, 60 high. No reported loss of consciousness. Alert and oriented. EXAM: CT HEAD WITHOUT CONTRAST CT CERVICAL SPINE WITHOUT CONTRAST TECHNIQUE: Multidetector CT imaging of the head and cervical spine was performed following the standard protocol without intravenous contrast. Multiplanar CT image reconstructions of the cervical spine were also generated. COMPARISON:  None. FINDINGS: CT HEAD FINDINGS  Ventricles and sulci are appropriate for patient's age. No evidence for acute cortically based infarct, intracranial hemorrhage, mass lesion or mass-effect. Orbits are unremarkable. Paranasal sinuses are well aerated. Mastoid air cells are unremarkable. Calvarium is intact. CT CERVICAL SPINE FINDINGS Normal anatomic alignment. Multilevel degenerative disc disease most pronounced C4-5, C5-6 C6-7. Multilevel facet degenerative changes. Craniocervical junction is intact. Lateral masses articulate appropriately with the dens. Prevertebral soft tissues are unremarkable.  IMPRESSION: No acute intracranial process. No acute cervical spine fracture. Electronically Signed   By: Annia Belt M.D.   On: 10/31/2015 15:15   Ct Chest Wo Contrast  Result Date: 10/31/2015 CLINICAL DATA:  Pain after trauma. EXAM: CT CHEST, ABDOMEN AND PELVIS WITHOUT CONTRAST TECHNIQUE: Multidetector CT imaging of the chest, abdomen and pelvis was performed following the standard protocol without IV contrast. COMPARISON:  CT of the chest January 08, 2011 FINDINGS: CT CHEST The central airways are normal. No pneumothorax. No pulmonary not, masses, or focal infiltrates. The thoracic aorta is normal in caliber measuring 3.9 cm. No aneurysm. The central pulmonary arteries are normal in caliber as well. There are coronary artery calcifications. The heart size is normal. No effusions. No adenopathy. CT ABDOMEN AND PELVIS No free air or free fluid. Evaluation of parenchymal organs is limited without contrast. The liver, gallbladder, spleen, adrenal glands, and pancreas are unremarkable. There is fatty deposition within the pancreatic head. Bilateral renal stones are seen, most numerous in the left lower pole. No hydronephrosis or perinephric stranding. No ureterectasis or ureteral stones. Atherosclerosis is seen in the abdominal aorta which is non aneurysmal. No adenopathy. A suture line is associated with the stomach consistent previous gastric surgery. The stomach and small bowel are otherwise normal. The colon demonstrates colonic diverticuli. Slight increased attenuation in the fat adjacent to the distal descending colon could represent very subtle diverticulitis. The remainder of the colon is normal. The appendix is unremarkable. The pelvis demonstrates a small cyst on the left on series 7, image 246 measuring 14 mm. This is of doubtful acute significance. No suspicious adenopathy or mass. The right bladder dome herniates into a right inguinal hernia. The remainder of the bladder is normal. No other  abnormalities in the pelvis. Subcortical sclerosis in the femoral head suggests small regions of DVT and. No acute bony abnormalities. There is a fat containing umbilical hernia. Another fat containing hernia is seen anteriorly, above the umbilicus. IMPRESSION: 1. Nonobstructive bilateral renal stones. 2. Atherosclerosis in the abdominal aorta. 3. 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 Minimal increased attenuation in the fat adjacent to the distal descending colon could represent mild diverticulitis in the appropriate clinical setting. 4. A small 14 mm cyst in the left lateral pelvis is of doubtful significance. This could represent a lymphocele or duplication cyst. 5. The right bladder dome herniates into a right inguinal hernia. This is not of acute significance and likely chronic. 6. 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 Electronically Signed   By: Gerome Sam III M.D   On: 10/31/2015 15:32   Ct Cervical Spine Wo Contrast  Result Date: 10/31/2015 CLINICAL DATA:  Patient status post fall from ladder, 60 high. No reported loss of consciousness. Alert and oriented. EXAM: CT HEAD WITHOUT CONTRAST CT CERVICAL SPINE WITHOUT CONTRAST TECHNIQUE: Multidetector CT imaging of the head and cervical spine was performed following the standard protocol without intravenous contrast. Multiplanar CT image reconstructions of the cervical spine were also generated. COMPARISON:  None. FINDINGS: CT HEAD FINDINGS Ventricles and sulci are appropriate for patient's age.  No evidence for acute cortically based infarct, intracranial hemorrhage, mass lesion or mass-effect. Orbits are unremarkable. Paranasal sinuses are well aerated. Mastoid air cells are unremarkable. Calvarium is intact. CT CERVICAL SPINE FINDINGS Normal anatomic alignment. Multilevel degenerative disc disease most pronounced C4-5, C5-6 C6-7. Multilevel facet degenerative changes. Craniocervical junction is intact. Lateral masses articulate appropriately with the dens.  Prevertebral soft tissues are unremarkable. IMPRESSION: No acute intracranial process. No acute cervical spine fracture. Electronically Signed   By: Lovey Newcomer M.D.   On: 10/31/2015 15:15   Ct Foot Right Wo Contrast  Result Date: 10/31/2015 CLINICAL DATA:  Status post 6 foot fall from a ladder today with a Lisfranc injury of the right foot. Initial encounter. EXAM: CT OF THE RIGHT FOOT WITHOUT CONTRAST TECHNIQUE: Multidetector CT imaging of the right foot was performed according to the standard protocol. Multiplanar CT image reconstructions were also generated. COMPARISON:  Plain films right foot this same day. FINDINGS: There is a chip fracture off the medial corner of the base of the fourth metatarsal. Fracture fragment measures a maximum of approximately 0.5 cm. Also seen is a chip fracture off the plantar aspect of the base of the third metatarsal. Minimally comminuted fracture off the inferior and medial corner of the base of the second metatarsal is also identified. Additional chip fracture is seen off the dorsal margin of the distal lateral cuneiform. No other fracture is identified. The first, second and third metatarsals ball are laterally subluxed approximately 0.5 cm of the tarsometatarsal joints. Soft tissue swelling and hematoma are seen about the foot. The Lisfranc ligament is markedly indistinct and torn. Ligaments are not well assessed on CT. Intrinsic musculature the foot appears atrophied. IMPRESSION: Findings consistent with a Lisfranc injury with chip fractures off the second, third and fourth metatarsals and lateral cuneiform as described above. The Lisfranc ligament is torn. Evaluation of ligaments on CT scan is limited. Electronically Signed   By: Inge Rise M.D.   On: 10/31/2015 16:28    ASSESSMENT AND PLAN:      1. Atrial fibrillation (HCC) - Maintaining sinus rhythm since sub xyphoid ablation in November 2016  with Dr. Lehman Prom and Dr. Danise Mina at [facility]. CHADSVASCs score of  3. Last dose of Xarelto 7/21. Echo 11/24/14 showed LV EF of 60-65%, mild LVH. Ok to take off anticoagulation for surgery. Long term management per Dr. Glori Bickers. He is schedule to see Dr. Irish Lack 12/21/15.  2. Right Foot fracture - Plan for sent home on Lovenox and possible surgery in 7-10 days.       SignedLeanor Kail, PA 11/02/2015, 2:12 PM Pager [phone]  Co-Sign MD  The patient has been seen in conjunction with Vin Bhagat, PAC. All aspects of care have been considered and discussed. The patient has been personally interviewed, examined, and all clinical data has been reviewed.   He has AF and is post ablation in November 2016 with no recurrence. Currently in NSR. He becomes very symptomatic with AF. Now has fractured his right foot which will need surgery.  We recommend holding Xarelto until surgery is completed and then resume at therapeutic dose when safe. Agree with using SQ Lovenox DVT prophylaxis until surgery completed.

## 2015-11-02 NOTE — Care Management Important Message (Signed)
Important Message  Patient Details  Name: [name] [name] MRN: [id number] Date of Birth: Apr 26, 1950   Medicare Important Message Given:  Yes    Loann Quill 11/02/2015, 9:05 AM

## 2015-11-02 NOTE — Progress Notes (Signed)
Orthopedic Tech Progress Note Patient Details:  [name] [name] 1950-11-14 [id number]  Ortho Devices Type of Ortho Device: Ace wrap, Post (short) splint Splint Material: Fiberglass Ortho Device/Splint Location: footsie roll Ortho Device/Splint Interventions: Application   [place] Pink 11/02/2015, 2:42 PM

## 2015-11-02 NOTE — Consult Note (Signed)
ORTHOPAEDIC CONSULTATION  REQUESTING PHYSICIAN: Robbie Lis, MD  Chief Complaint: Lisfranc fracture dislocation right foot  HPI: [name] [name] is a 65 y.o. male who presents with Lisfranc fracture dislocation right foot closed. Patient states he was on a ladder pressure washing his house with the force of the pressure washer knocked him off the ladder landed on his forefoot on the right with approximately 6 foot fall. Patient had immediate onset of pain. Injury occurred on Saturday patient was admitted splinted and has by the patient's report not been seen by orthopedic physician. Patient was a patient of [facility] has seen Dr. Lisette Grinder in the past requested Dr. Doran Durand and he is out of town patient was then referred to Dr. Maxie Better who then referred the patient to Dr. Marlou Sa and patient was also referred to Dr. Marcelino Scot. Patient states that Dr. Marcelino Scot was out of network and that he would not follow up with Dr. Marcelino Scot who had recommended surgery in 10-14 days.  Past Medical History:  Diagnosis Date  . Anemia   . Atrial flutter ([place])    a. s/p RFCA 8/13  . Chronic combined systolic and diastolic CHF (congestive heart failure) ([place])    a. LVEF previously 35% felt to be due tachycardia;  b. 02/2013 Echo: EF 50-55%, no rwma, mildly dil LA/RA, mild to mod MR. C 11/2014 echo - ef 60-65%, unable to determine DD  . CKD (chronic kidney disease), stage III    hx of a/c renal failure during episode of diverticulitis 9/13 in [place], [place]  . Diabetes mellitus ([place])   . Diverticulosis   . Erectile dysfunction   . Gastric ulcer    s/p prior surgery  . Hypertension   . Hypothyroid   . Obesities, morbid ([place])   . Obstructive sleep apnea    noncompliant with CPAP  . PAF (paroxysmal atrial fibrillation) ([place])    a. failed DCCV and multiple anti-arrhythmic drugs (multaq/flecainide);   b. s/p  PVI isolation with ablation of AFib 8/13; c. repeat PVI 01/2013;  d. 02/2013 repeat DCCV->Amio  load/xarelto.  . Renal calculi    Past Surgical History:  Procedure Laterality Date  . ATRIAL FIBRILLATION ABLATION  12/06/11; 02/05/2013   Afib and atrial flutter ablation by Dr Rayann Heman; repeat PVI by Dr Rayann Heman 02/05/2013  . ATRIAL FIBRILLATION ABLATION N/A 12/06/2011   Procedure: ATRIAL FIBRILLATION ABLATION;  Surgeon: Thompson Grayer, MD;  Location: [facility] CATH LAB;  Service: Cardiovascular;  Laterality: N/A;  . ATRIAL FIBRILLATION ABLATION N/A 02/05/2013   Procedure: ATRIAL FIBRILLATION ABLATION;  Surgeon: Coralyn Mark, MD;  Location: [facility] CATH LAB;  Service: Cardiovascular;  Laterality: N/A;  . benign stomach tumor removal  2000  . CARDIOVERSION  08/25/2011   Procedure: CARDIOVERSION;  Surgeon: Jettie Booze, MD;  Location: [facility];  Service: Cardiovascular;  Laterality: N/A;  . CARDIOVERSION N/A 03/05/2013   Procedure: CARDIOVERSION;  Surgeon: Sinclair Grooms, MD;  Location: [facility];  Service: Cardiovascular;  Laterality: N/A;  BEDSIDE   . convergent afib abaltion [facility] 02/26/15  02/26/15   [facility] by Dr. Lehman Prom and Dr. Danise Mina  . ELECTROPHYSIOLOGIC STUDY N/A 11/24/2014   Procedure: Cardioversion;  Surgeon: Will Meredith Leeds, MD;  Location: [facility] CV LAB;  Service: Cardiovascular;  Laterality: N/A;  . ESOPHAGOGASTRODUODENOSCOPY N/A 04/09/2013   Procedure: ESOPHAGOGASTRODUODENOSCOPY (EGD) with possible Balloon Dilation;  Surgeon: Garlan Fair, MD;  Location: [facility] ENDOSCOPY;  Service: Endoscopy;  Laterality: N/A;  . kidney stone removal  multiple  . knee sx  1985   both  . LAPAROSCOPIC RETROPUBIC PROSTATECTOMY  02/2007   hx prostate cancer  . TEE WITHOUT CARDIOVERSION  08/25/2011   Procedure: TRANSESOPHAGEAL ECHOCARDIOGRAM (TEE);  Surgeon: Jettie Booze, MD;  Location: [facility];  Service: Cardiovascular;  Laterality: N/A;  . TEE WITHOUT CARDIOVERSION  11/09/2011   Procedure: TRANSESOPHAGEAL ECHOCARDIOGRAM (TEE);  Surgeon: Jettie Booze, MD;  Location: [facility]  ENDOSCOPY;  Service: Cardiovascular;  Laterality: N/A;  h/p in file drawer  . TEE WITHOUT CARDIOVERSION N/A 02/05/2013   Procedure: TRANSESOPHAGEAL ECHOCARDIOGRAM (TEE);  Surgeon: Candee Furbish, MD;  Location: [facility] ENDOSCOPY;  Service: Cardiovascular;  Laterality: N/A;  . THYROIDECTOMY  2009   cysts removal   . wrist sx  1980   right   Social History   Social History  . Marital status: Married    Spouse name: N/A  . Number of children: N/A  . Years of education: N/A   Social History Main Topics  . Smoking status: Never Smoker  . Smokeless tobacco: None  . Alcohol use No  . Drug use: No  . Sexual activity: Yes   Other Topics Concern  . None   Social History Narrative   Lives in [place].  Works as a Librarian, academic at [employer] History  Problem Relation Age of Onset  . Emphysema Mother   . Lung cancer Father   . Diabetes Brother   . Colon cancer Neg Hx    - negative except otherwise stated in the family history section Allergies  Allergen Reactions  . Phenergan [Promethazine Hcl] Itching    Hallucination   . Aspirin     Hx of ulcer   . Bystolic [Nebivolol Hcl] Swelling    Bradycardia   . Calcium Channel Blockers Swelling    LE edema  . Prednisone     Hx of ulcer  . Nsaids     Hx ulcer   Prior to Admission medications   Medication Sig Start Date End Date Taking? Authorizing Provider  albuterol (PROVENTIL) (2.5 MG/3ML) 0.083% nebulizer solution Take 2.5 mg by nebulization 2 (two) times daily as needed for wheezing or shortness of breath.  05/22/13  Yes Historical Provider, MD  Canagliflozin (INVOKANA) 300 MG TABS Take 1 tablet by mouth daily.   Yes Historical Provider, MD  carvedilol (COREG) 12.5 MG tablet Take 1.5 tablets (18.75 mg total) by mouth 2 (two) times daily. Patient taking differently: Take 12.5 mg by mouth 2 (two) times daily.  05/22/15  Yes Thompson Grayer, MD  Cholecalciferol (VITAMIN D) 2000 UNITS CAPS Take 2,000 Units by mouth daily.    Yes Historical Provider, MD  diltiazem (CARDIZEM) 30 MG tablet Take 1 tablet (30 mg total) by mouth as needed (Every 4 hours AS NEEDED with a HR 123XX123 and Systolic 123XX123 (BP)). [id number]  Yes Sherran Needs, NP  DULoxetine (CYMBALTA) 30 MG capsule Take 1 capsule by mouth every morning. 10/23/15  Yes Historical Provider, MD  ferrous sulfate 325 (65 FE) MG tablet Take 1 tablet (325 mg total) by mouth daily with breakfast. 11/24/14  Yes Erlene Quan, PA-C  folic acid (FOLVITE) 1 MG tablet Take 1 mg by mouth daily.   Yes Historical Provider, MD  GLUCOSAMINE PO Take 1,000 mg by mouth 2 (two) times daily.    Yes Historical Provider, MD  imipramine (TOFRANIL) 25 MG tablet Take 50 mg by mouth at bedtime.    Yes Historical Provider, MD  levothyroxine (  SYNTHROID, LEVOTHROID) 150 MCG tablet Take 150 mcg by mouth daily before breakfast.   Yes Historical Provider, MD  Multiple Vitamins-Minerals (MENS 50+ MULTI VITAMIN/MIN PO) Take 1 tablet by mouth daily.   Yes Historical Provider, MD  pantoprazole (PROTONIX) 40 MG tablet Take 40 mg by mouth daily.    Yes Historical Provider, MD  polycarbophil (FIBERCON) 625 MG tablet Take 1,250 mg by mouth daily.   Yes Historical Provider, MD  potassium chloride SA (K-DUR,KLOR-CON) 20 MEQ tablet Take 2 tablets (40 mEq total) by mouth daily. Patient taking differently: Take 40 mEq by mouth 2 (two) times daily.  11/24/14  Yes Luke K Kilroy, PA-C  pravastatin (PRAVACHOL) 20 MG tablet Take 10 mg by mouth at bedtime.    Yes Historical Provider, MD  Rivaroxaban (XARELTO) 15 MG TABS tablet Take 1 tablet (15 mg total) by mouth every evening. 05/22/15  Yes Thompson Grayer, MD   No results found. - pertinent xrays, CT, MRI studies were reviewed and independently interpreted  Positive ROS: All other systems have been reviewed and were otherwise negative with the exception of those mentioned in the HPI and as above.  Physical Exam: General: Alert, no acute distress Cardiovascular:Patient has  a history of atrial fibrillation which has been treated. Patient states he has not had atrial fibrillation several years. Patient has been on Xarelto but he is not quite sure why he still on Xarelto and he states that his cardiologist was going to take him off it. Respiratory: No cyanosis, no use of accessory musculature GI: No organomegaly, abdomen is soft and non-tender Skin: Patient has ecchymosis and bruising in the right foot he has a good dorsalis pedis pulse there is minimal swelling. There is wrinkling of the skin. Neurologic: Sensation intact distally Psychiatric: Patient is competent for consent with normal mood and affect Lymphatic: No axillary or cervical lymphadenopathy Endocrine: Patient does have type 2 diabetes which she states is under control  MUSCULOSKELETAL:  On examination patient has a strong dorsalis pedis pulse. He has no open wounds. He does have protective sensation. Patient x-rays and CT scan were reviewed which shows a Lisfranc fracture dislocation involving the Lisfranc joint transversely. The base of the fourth and fifth metatarsals are aligned and not displaced. The base of the second and first metatarsal do show fractures through the Lisfranc joint and are both displaced.  Assessment: Assessment: Lisfranc fracture dislocation right foot. With a history of controlled type 2 diabetes history of A. fib negative history for tobacco use positive history for sleep apnea  Plan: Plan: We'll plan for open reduction internal fixation Lisfranc fracture dislocation. Risks and benefits were discussed including infection neurovascular injury DVT nonhealing the wound need for additional surgery. Patient states he understands wish to proceed at this time we'll plan for a Prevena wound VAC postoperatively as well as the Vive Wear medical compression socks. Nonweightbearing postoperatively. Anticipate discharge in a day or 2 after surgery.  Thank you for the consult and the  opportunity to see Mr. Alez Rivette, [place] [phone] 6:01 PM

## 2015-11-02 NOTE — Consult Note (Signed)
Orthopaedic Trauma Service (OTS) Consult   Reason for Consult: Right Lisfranc disruption Referring Physician: A. Charlies Silvers, MD (internal medicine(   HPI: [name] [name] is an 65 y.o. white male who sustained a fall off of a ladder on 10/31/2015. He was power  washing his house he fell from proximally 6 feet. Patient was blown off a ladder from the back splash of the power washer. Patient landed on the ball of his right foot. Patient immediately noticed deformity of his right foot and realigned the foot himself. Patient was brought to [place] for evaluation and was found to have a Lisfranc disruption of the right foot due to his medical comorbidities he was admitted to the medicine service.   Patient's medical history is notable for hypertension, CHF, CTD stage III, OSA and hypothyroidism. He also has a history of paroxysmal A. fib that has been treated at [facility] and the patient has not been in A. fib since November 2016. He does remain on Xarelto. His cardiologist is Dr. Rayann Heman here in town.   Patient has been off of his xarelto since admission   On initial presentation the family was adamant about seeing [facility] orthopedics and Dr. Doran Durand. Apparently there is some confusion and patient has not been seen by orthopedics since admission. The orthopedic trauma service has reviewed the films.  Past Medical History:  Diagnosis Date  . Anemia   . Atrial flutter ([place])    a. s/p RFCA 8/13  . Chronic combined systolic and diastolic CHF (congestive heart failure) ([place])    a. LVEF previously 35% felt to be due tachycardia;  b. 02/2013 Echo: EF 50-55%, no rwma, mildly dil LA/RA, mild to mod MR.  . CKD (chronic kidney disease), stage III    hx of a/c renal failure during episode of diverticulitis 9/13 in [place], [place]  . Diabetes mellitus ([place])   . Diverticulosis   . Erectile dysfunction   . Gastric ulcer    s/p prior surgery  . Hypertension   . Hypothyroid   . Obesities, morbid  ([place])   . Obstructive sleep apnea    noncompliant with CPAP  . PAF (paroxysmal atrial fibrillation) ([place])    a. failed DCCV and multiple anti-arrhythmic drugs (multaq/flecainide);   b. s/p  PVI isolation with ablation of AFib 8/13; c. repeat PVI 01/2013;  d. 02/2013 repeat DCCV->Amio load/xarelto.  . Renal calculi     Past Surgical History:  Procedure Laterality Date  . ATRIAL FIBRILLATION ABLATION  12/06/11; 02/05/2013   Afib and atrial flutter ablation by Dr Rayann Heman; repeat PVI by Dr Rayann Heman 02/05/2013  . ATRIAL FIBRILLATION ABLATION N/A 12/06/2011   Procedure: ATRIAL FIBRILLATION ABLATION;  Surgeon: Thompson Grayer, MD;  Location: [facility] CATH LAB;  Service: Cardiovascular;  Laterality: N/A;  . ATRIAL FIBRILLATION ABLATION N/A 02/05/2013   Procedure: ATRIAL FIBRILLATION ABLATION;  Surgeon: Coralyn Mark, MD;  Location: [place] CATH LAB;  Service: Cardiovascular;  Laterality: N/A;  . benign stomach tumor removal  2000  . CARDIOVERSION  08/25/2011   Procedure: CARDIOVERSION;  Surgeon: Jettie Booze, MD;  Location: [place];  Service: Cardiovascular;  Laterality: N/A;  . CARDIOVERSION N/A 03/05/2013   Procedure: CARDIOVERSION;  Surgeon: Sinclair Grooms, MD;  Location: [place];  Service: Cardiovascular;  Laterality: N/A;  BEDSIDE   . convergent afib abaltion [facility] 02/26/15  02/26/15   [facility] by Dr. Lehman Prom and Dr. Danise Mina  . ELECTROPHYSIOLOGIC STUDY N/A 11/24/2014   Procedure: Cardioversion;  Surgeon: [employer],  MD;  Location: [place] CV LAB;  Service: Cardiovascular;  Laterality: N/A;  . ESOPHAGOGASTRODUODENOSCOPY N/A 04/09/2013   Procedure: ESOPHAGOGASTRODUODENOSCOPY (EGD) with possible Balloon Dilation;  Surgeon: Garlan Fair, MD;  Location: [facility] ENDOSCOPY;  Service: Endoscopy;  Laterality: N/A;  . kidney stone removal     multiple  . knee sx  1985   both  . LAPAROSCOPIC RETROPUBIC PROSTATECTOMY  02/2007   hx prostate cancer  . TEE WITHOUT CARDIOVERSION  08/25/2011    Procedure: TRANSESOPHAGEAL ECHOCARDIOGRAM (TEE);  Surgeon: Jettie Booze, MD;  Location: [place];  Service: Cardiovascular;  Laterality: N/A;  . TEE WITHOUT CARDIOVERSION  11/09/2011   Procedure: TRANSESOPHAGEAL ECHOCARDIOGRAM (TEE);  Surgeon: Jettie Booze, MD;  Location: [facility] ENDOSCOPY;  Service: Cardiovascular;  Laterality: N/A;  h/p in file drawer  . TEE WITHOUT CARDIOVERSION N/A 02/05/2013   Procedure: TRANSESOPHAGEAL ECHOCARDIOGRAM (TEE);  Surgeon: Candee Furbish, MD;  Location: [facility] ENDOSCOPY;  Service: Cardiovascular;  Laterality: N/A;  . THYROIDECTOMY  2009   cysts removal   . wrist sx  1980   right    Family History  Problem Relation Age of Onset  . Emphysema Mother   . Lung cancer Father   . Diabetes Brother   . Colon cancer Neg Hx     Social History:  reports that he has never smoked. He does not have any smokeless tobacco history on file. He reports that he does not drink alcohol or use drugs.  Allergies:  Allergies  Allergen Reactions  . Phenergan [Promethazine Hcl] Itching    Hallucination   . Aspirin     Hx of ulcer   . Bystolic [Nebivolol Hcl] Swelling    Bradycardia   . Calcium Channel Blockers Swelling    LE edema  . Prednisone     Hx of ulcer  . Nsaids     Hx ulcer    Medications:  I have reviewed the patient's current medications. Prior to Admission:  Prescriptions Prior to Admission  Medication Sig Dispense Refill Last Dose  . albuterol (PROVENTIL) (2.5 MG/3ML) 0.083% nebulizer solution Take 2.5 mg by nebulization 2 (two) times daily as needed for wheezing or shortness of breath.    unkn  . Canagliflozin (INVOKANA) 300 MG TABS Take 1 tablet by mouth daily.   10/31/2015 at Unknown time  . carvedilol (COREG) 12.5 MG tablet Take 1.5 tablets (18.75 mg total) by mouth 2 (two) times daily. (Patient taking differently: Take 12.5 mg by mouth 2 (two) times daily. ) 270 tablet 3 10/31/2015 at 0800  . Cholecalciferol (VITAMIN D) 2000 UNITS CAPS  Take 2,000 Units by mouth daily.   10/31/2015 at Unknown time  . diltiazem (CARDIZEM) 30 MG tablet Take 1 tablet (30 mg total) by mouth as needed (Every 4 hours AS NEEDED with a HR >852 and Systolic >778 (BP)). 30 tablet 3 unkn  . DULoxetine (CYMBALTA) 30 MG capsule Take 1 capsule by mouth every morning.   10/31/2015 at Unknown time  . ferrous sulfate 325 (65 FE) MG tablet Take 1 tablet (325 mg total) by mouth daily with breakfast. 90 tablet 1 10/31/2015 at Unknown time  . folic acid (FOLVITE) 1 MG tablet Take 1 mg by mouth daily.   10/31/2015 at Unknown time  . GLUCOSAMINE PO Take 1,000 mg by mouth 2 (two) times daily.    10/31/2015 at Unknown time  . imipramine (TOFRANIL) 25 MG tablet Take 50 mg by mouth at bedtime.    10/30/2015 at Unknown time  .  levothyroxine (SYNTHROID, LEVOTHROID) 150 MCG tablet Take 150 mcg by mouth daily before breakfast.   10/31/2015 at Unknown time  . Multiple Vitamins-Minerals (MENS 50+ MULTI VITAMIN/MIN PO) Take 1 tablet by mouth daily.   10/31/2015 at Unknown time  . pantoprazole (PROTONIX) 40 MG tablet Take 40 mg by mouth daily.    10/31/2015 at Unknown time  . polycarbophil (FIBERCON) 625 MG tablet Take 1,250 mg by mouth daily.   10/30/2015 at Unknown time  . potassium chloride SA (K-DUR,KLOR-CON) 20 MEQ tablet Take 2 tablets (40 mEq total) by mouth daily. (Patient taking differently: Take 40 mEq by mouth 2 (two) times daily. ) 180 tablet 1 10/31/2015 at Unknown time  . pravastatin (PRAVACHOL) 20 MG tablet Take 10 mg by mouth at bedtime.    10/30/2015 at Unknown time  . Rivaroxaban (XARELTO) 15 MG TABS tablet Take 1 tablet (15 mg total) by mouth every evening. 90 tablet 3 10/30/2015 at Unknown time   Scheduled: . carvedilol  12.5 mg Oral BID  . DULoxetine  30 mg Oral Daily  . ferrous sulfate  325 mg Oral Q breakfast  . folic acid  1 mg Oral Daily  . imipramine  50 mg Oral QHS  . insulin aspart  0-5 Units Subcutaneous QHS  . insulin aspart  0-9 Units Subcutaneous TID WC  .  levothyroxine  150 mcg Oral QAC breakfast  . pantoprazole  40 mg Oral Daily  . polycarbophil  1,250 mg Oral Daily  . potassium chloride SA  40 mEq Oral BID  . pravastatin  10 mg Oral QHS  . senna  1 tablet Oral BID    Results for orders placed or performed during the hospital encounter of 10/31/15 (from the past 48 hour(s))  Sample to Blood Bank     Status: None   Collection Time: 10/31/15 12:59 PM  Result Value Ref Range   Blood Bank Specimen SAMPLE AVAILABLE FOR TESTING    Sample Expiration 11/01/2015   CDS serology     Status: None   Collection Time: 10/31/15  1:30 PM  Result Value Ref Range   CDS serology specimen      SPECIMEN WILL BE HELD FOR 14 DAYS IF TESTING IS REQUIRED  Comprehensive metabolic panel     Status: Abnormal   Collection Time: 10/31/15  1:30 PM  Result Value Ref Range   Sodium 137 135 - 145 mmol/L   Potassium 3.8 3.5 - 5.1 mmol/L   Chloride 115 (H) 101 - 111 mmol/L   CO2 17 (L) 22 - 32 mmol/L   Glucose, Bld 94 65 - 99 mg/dL   BUN 18 6 - 20 mg/dL   Creatinine, Ser 1.76 (H) 0.61 - 1.24 mg/dL   Calcium 9.0 8.9 - 10.3 mg/dL   Total Protein 6.1 (L) 6.5 - 8.1 g/dL   Albumin 3.6 3.5 - 5.0 g/dL   AST 20 15 - 41 U/L   ALT 16 (L) 17 - 63 U/L   Alkaline Phosphatase 75 38 - 126 U/L   Total Bilirubin 0.6 0.3 - 1.2 mg/dL   GFR calc non Af Amer 39 (L) >60 mL/min   GFR calc Af Amer 45 (L) >60 mL/min    Comment: (NOTE) The eGFR has been calculated using the CKD EPI equation. This calculation has not been validated in all clinical situations. eGFR's persistently <60 mL/min signify possible Chronic Kidney Disease.    Anion gap 5 5 - 15  CBC     Status: Abnormal  Collection Time: 10/31/15  1:30 PM  Result Value Ref Range   WBC 8.9 4.0 - 10.5 K/uL   RBC 5.25 4.22 - 5.81 MIL/uL   Hemoglobin 13.1 13.0 - 17.0 g/dL   HCT 42.3 39.0 - 52.0 %   MCV 80.6 78.0 - 100.0 fL   MCH 25.0 (L) 26.0 - 34.0 pg   MCHC 31.0 30.0 - 36.0 g/dL   RDW 22.0 (H) 11.5 - 15.5 %    Platelets 219 150 - 400 K/uL  Protime-INR     Status: Abnormal   Collection Time: 10/31/15  1:30 PM  Result Value Ref Range   Prothrombin Time 21.5 (H) 11.6 - 15.2 seconds   INR 1.88 (H) 0.00 - 1.49  APTT     Status: None   Collection Time: 10/31/15  1:30 PM  Result Value Ref Range   aPTT 35 24 - 37 seconds  I-Stat Chem 8, ED     Status: Abnormal   Collection Time: 10/31/15  1:38 PM  Result Value Ref Range   Sodium 142 135 - 145 mmol/L   Potassium 4.0 3.5 - 5.1 mmol/L   Chloride 111 101 - 111 mmol/L   BUN 23 (H) 6 - 20 mg/dL   Creatinine, Ser 1.70 (H) 0.61 - 1.24 mg/dL   Glucose, Bld 89 65 - 99 mg/dL   Calcium, Ion 1.15 1.12 - 1.23 mmol/L   TCO2 18 0 - 100 mmol/L   Hemoglobin 14.3 13.0 - 17.0 g/dL   HCT 42.0 39.0 - 52.0 %  I-Stat CG4 Lactic Acid, ED     Status: None   Collection Time: 10/31/15  1:39 PM  Result Value Ref Range   Lactic Acid, Venous 1.38 0.5 - 1.9 mmol/L  Hemoglobin A1c     Status: Abnormal   Collection Time: 10/31/15  7:00 PM  Result Value Ref Range   Hgb A1c MFr Bld 6.7 (H) 4.8 - 5.6 %    Comment: (NOTE)         Pre-diabetes: 5.7 - 6.4         Diabetes: >6.4         Glycemic control for adults with diabetes: <7.0    Mean Plasma Glucose 146 mg/dL    Comment: (NOTE) Performed At: [facility] [place], [place] [id number] Lindon Romp MD [id number]   TSH     Status: None   Collection Time: 10/31/15  7:45 PM  Result Value Ref Range   TSH 1.402 0.350 - 4.500 uIU/mL  Glucose, capillary     Status: Abnormal   Collection Time: 10/31/15  9:22 PM  Result Value Ref Range   Glucose-Capillary 120 (H) 65 - 99 mg/dL  Urinalysis, Routine w reflex microscopic     Status: Abnormal   Collection Time: 10/31/15  9:33 PM  Result Value Ref Range   Color, Urine YELLOW YELLOW   APPearance CLEAR CLEAR   Specific Gravity, Urine 1.027 1.005 - 1.030   pH 5.5 5.0 - 8.0   Glucose, UA >1000 (A) NEGATIVE mg/dL   Hgb urine dipstick NEGATIVE  NEGATIVE   Bilirubin Urine NEGATIVE NEGATIVE   Ketones, ur 15 (A) NEGATIVE mg/dL   Protein, ur NEGATIVE NEGATIVE mg/dL   Nitrite NEGATIVE NEGATIVE   Leukocytes, UA NEGATIVE NEGATIVE  Urine microscopic-add on     Status: Abnormal   Collection Time: 10/31/15  9:33 PM  Result Value Ref Range   Squamous Epithelial / LPF 0-5 (A) NONE SEEN   WBC,  UA 0-5 0 - 5 WBC/hpf   RBC / HPF 0-5 0 - 5 RBC/hpf   Bacteria, UA RARE (A) NONE SEEN   Casts HYALINE CASTS (A) NEGATIVE  CBC     Status: Abnormal   Collection Time: 11/01/15  3:13 AM  Result Value Ref Range   WBC 8.4 4.0 - 10.5 K/uL   RBC 4.80 4.22 - 5.81 MIL/uL   Hemoglobin 12.1 (L) 13.0 - 17.0 g/dL   HCT 39.2 39.0 - 52.0 %   MCV 81.7 78.0 - 100.0 fL   MCH 25.2 (L) 26.0 - 34.0 pg   MCHC 30.9 30.0 - 36.0 g/dL   RDW 21.9 (H) 11.5 - 15.5 %   Platelets 188 150 - 400 K/uL  Basic metabolic panel     Status: Abnormal   Collection Time: 11/01/15  3:13 AM  Result Value Ref Range   Sodium 138 135 - 145 mmol/L   Potassium 3.4 (L) 3.5 - 5.1 mmol/L   Chloride 112 (H) 101 - 111 mmol/L   CO2 19 (L) 22 - 32 mmol/L   Glucose, Bld 111 (H) 65 - 99 mg/dL   BUN 17 6 - 20 mg/dL   Creatinine, Ser 1.46 (H) 0.61 - 1.24 mg/dL   Calcium 8.6 (L) 8.9 - 10.3 mg/dL   GFR calc non Af Amer 49 (L) >60 mL/min   GFR calc Af Amer 56 (L) >60 mL/min    Comment: (NOTE) The eGFR has been calculated using the CKD EPI equation. This calculation has not been validated in all clinical situations. eGFR's persistently <60 mL/min signify possible Chronic Kidney Disease.    Anion gap 7 5 - 15  Glucose, capillary     Status: Abnormal   Collection Time: 11/01/15  6:43 AM  Result Value Ref Range   Glucose-Capillary 108 (H) 65 - 99 mg/dL  Glucose, capillary     Status: Abnormal   Collection Time: 11/01/15 12:05 PM  Result Value Ref Range   Glucose-Capillary 128 (H) 65 - 99 mg/dL   Comment 1 Notify RN   Glucose, capillary     Status: Abnormal   Collection Time: 11/01/15  4:12  PM  Result Value Ref Range   Glucose-Capillary 121 (H) 65 - 99 mg/dL   Comment 1 Notify RN   Glucose, capillary     Status: Abnormal   Collection Time: 11/01/15 10:25 PM  Result Value Ref Range   Glucose-Capillary 139 (H) 65 - 99 mg/dL  CBC     Status: Abnormal   Collection Time: 11/02/15  3:29 AM  Result Value Ref Range   WBC 8.5 4.0 - 10.5 K/uL   RBC 4.58 4.22 - 5.81 MIL/uL   Hemoglobin 11.6 (L) 13.0 - 17.0 g/dL   HCT 37.2 (L) 39.0 - 52.0 %   MCV 81.2 78.0 - 100.0 fL   MCH 25.3 (L) 26.0 - 34.0 pg   MCHC 31.2 30.0 - 36.0 g/dL   RDW 21.5 (H) 11.5 - 15.5 %   Platelets 190 150 - 400 K/uL  Basic metabolic panel     Status: Abnormal   Collection Time: 11/02/15  3:29 AM  Result Value Ref Range   Sodium 135 135 - 145 mmol/L   Potassium 3.8 3.5 - 5.1 mmol/L   Chloride 109 101 - 111 mmol/L   CO2 19 (L) 22 - 32 mmol/L   Glucose, Bld 103 (H) 65 - 99 mg/dL   BUN 14 6 - 20 mg/dL   Creatinine, Ser 1.28 (H)  0.61 - 1.24 mg/dL   Calcium 8.5 (L) 8.9 - 10.3 mg/dL   GFR calc non Af Amer 57 (L) >60 mL/min   GFR calc Af Amer >60 >60 mL/min    Comment: (NOTE) The eGFR has been calculated using the CKD EPI equation. This calculation has not been validated in all clinical situations. eGFR's persistently <60 mL/min signify possible Chronic Kidney Disease.    Anion gap 7 5 - 15  Glucose, capillary     Status: Abnormal   Collection Time: 11/02/15  6:27 AM  Result Value Ref Range   Glucose-Capillary 108 (H) 65 - 99 mg/dL  Glucose, capillary     Status: Abnormal   Collection Time: 11/02/15  9:09 AM  Result Value Ref Range   Glucose-Capillary 140 (H) 65 - 99 mg/dL    Ct Abdomen Pelvis Wo Contrast  Result Date: 10/31/2015 CLINICAL DATA:  Pain after trauma. EXAM: CT CHEST, ABDOMEN AND PELVIS WITHOUT CONTRAST TECHNIQUE: Multidetector CT imaging of the chest, abdomen and pelvis was performed following the standard protocol without IV contrast. COMPARISON:  CT of the chest January 08, 2011  FINDINGS: CT CHEST The central airways are normal. No pneumothorax. No pulmonary not, masses, or focal infiltrates. The thoracic aorta is normal in caliber measuring 3.9 cm. No aneurysm. The central pulmonary arteries are normal in caliber as well. There are coronary artery calcifications. The heart size is normal. No effusions. No adenopathy. CT ABDOMEN AND PELVIS No free air or free fluid. Evaluation of parenchymal organs is limited without contrast. The liver, gallbladder, spleen, adrenal glands, and pancreas are unremarkable. There is fatty deposition within the pancreatic head. Bilateral renal stones are seen, most numerous in the left lower pole. No hydronephrosis or perinephric stranding. No ureterectasis or ureteral stones. Atherosclerosis is seen in the abdominal aorta which is non aneurysmal. No adenopathy. A suture line is associated with the stomach consistent previous gastric surgery. The stomach and small bowel are otherwise normal. The colon demonstrates colonic diverticuli. Slight increased attenuation in the fat adjacent to the distal descending colon could represent very subtle diverticulitis. The remainder of the colon is normal. The appendix is unremarkable. The pelvis demonstrates a small cyst on the left on series 7, image 246 measuring 14 mm. This is of doubtful acute significance. No suspicious adenopathy or mass. The right bladder dome herniates into a right inguinal hernia. The remainder of the bladder is normal. No other abnormalities in the pelvis. Subcortical sclerosis in the femoral head suggests small regions of DVT and. No acute bony abnormalities. There is a fat containing umbilical hernia. Another fat containing hernia is seen anteriorly, above the umbilicus. IMPRESSION: 1. Nonobstructive bilateral renal stones. 2. Atherosclerosis in the abdominal aorta. 3. 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 Minimal increased attenuation in the fat adjacent to the distal descending colon  could represent mild diverticulitis in the appropriate clinical setting. 4. A small 14 mm cyst in the left lateral pelvis is of doubtful significance. This could represent a lymphocele or duplication cyst. 5. The right bladder dome herniates into a right inguinal hernia. This is not of acute significance and likely chronic. 6. 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 Electronically Signed   By: Dorise Bullion III M.D   On: 10/31/2015 15:32   Dg Tibia/fibula Right  Result Date: 10/31/2015 CLINICAL DATA:  Distal tib-fib pain after fall from ladder this afternoon. History of right ankle fracture. EXAM: RIGHT TIBIA AND FIBULA - 2 VIEW COMPARISON:  None. FINDINGS: There is no evidence  of fracture or other focal bone lesions. Soft tissues are unremarkable. IMPRESSION: Negative. Electronically Signed   By: Franki Cabot M.D.   On: 10/31/2015 16:18   Ct Head Wo Contrast  Result Date: 10/31/2015 CLINICAL DATA:  Patient status post fall from ladder, 60 high. No reported loss of consciousness. Alert and oriented. EXAM: CT HEAD WITHOUT CONTRAST CT CERVICAL SPINE WITHOUT CONTRAST TECHNIQUE: Multidetector CT imaging of the head and cervical spine was performed following the standard protocol without intravenous contrast. Multiplanar CT image reconstructions of the cervical spine were also generated. COMPARISON:  None. FINDINGS: CT HEAD FINDINGS Ventricles and sulci are appropriate for patient's age. No evidence for acute cortically based infarct, intracranial hemorrhage, mass lesion or mass-effect. Orbits are unremarkable. Paranasal sinuses are well aerated. Mastoid air cells are unremarkable. Calvarium is intact. CT CERVICAL SPINE FINDINGS Normal anatomic alignment. Multilevel degenerative disc disease most pronounced C4-5, C5-6 C6-7. Multilevel facet degenerative changes. Craniocervical junction is intact. Lateral masses articulate appropriately with the dens. Prevertebral soft tissues are unremarkable. IMPRESSION: No acute  intracranial process. No acute cervical spine fracture. Electronically Signed   By: Lovey Newcomer M.D.   On: 10/31/2015 15:15   Ct Chest Wo Contrast  Result Date: 10/31/2015 CLINICAL DATA:  Pain after trauma. EXAM: CT CHEST, ABDOMEN AND PELVIS WITHOUT CONTRAST TECHNIQUE: Multidetector CT imaging of the chest, abdomen and pelvis was performed following the standard protocol without IV contrast. COMPARISON:  CT of the chest January 08, 2011 FINDINGS: CT CHEST The central airways are normal. No pneumothorax. No pulmonary not, masses, or focal infiltrates. The thoracic aorta is normal in caliber measuring 3.9 cm. No aneurysm. The central pulmonary arteries are normal in caliber as well. There are coronary artery calcifications. The heart size is normal. No effusions. No adenopathy. CT ABDOMEN AND PELVIS No free air or free fluid. Evaluation of parenchymal organs is limited without contrast. The liver, gallbladder, spleen, adrenal glands, and pancreas are unremarkable. There is fatty deposition within the pancreatic head. Bilateral renal stones are seen, most numerous in the left lower pole. No hydronephrosis or perinephric stranding. No ureterectasis or ureteral stones. Atherosclerosis is seen in the abdominal aorta which is non aneurysmal. No adenopathy. A suture line is associated with the stomach consistent previous gastric surgery. The stomach and small bowel are otherwise normal. The colon demonstrates colonic diverticuli. Slight increased attenuation in the fat adjacent to the distal descending colon could represent very subtle diverticulitis. The remainder of the colon is normal. The appendix is unremarkable. The pelvis demonstrates a small cyst on the left on series 7, image 246 measuring 14 mm. This is of doubtful acute significance. No suspicious adenopathy or mass. The right bladder dome herniates into a right inguinal hernia. The remainder of the bladder is normal. No other abnormalities in the pelvis.  Subcortical sclerosis in the femoral head suggests small regions of DVT and. No acute bony abnormalities. There is a fat containing umbilical hernia. Another fat containing hernia is seen anteriorly, above the umbilicus. IMPRESSION: 1. Nonobstructive bilateral renal stones. 2. Atherosclerosis in the abdominal aorta. 3. 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 Minimal increased attenuation in the fat adjacent to the distal descending colon could represent mild diverticulitis in the appropriate clinical setting. 4. A small 14 mm cyst in the left lateral pelvis is of doubtful significance. This could represent a lymphocele or duplication cyst. 5. The right bladder dome herniates into a right inguinal hernia. This is not of acute significance and likely chronic. 6. AVN in  the femoral heads. Electronically Signed   By: Dorise Bullion III M.D   On: 10/31/2015 15:32   Ct Cervical Spine Wo Contrast  Result Date: 10/31/2015 CLINICAL DATA:  Patient status post fall from ladder, 60 high. No reported loss of consciousness. Alert and oriented. EXAM: CT HEAD WITHOUT CONTRAST CT CERVICAL SPINE WITHOUT CONTRAST TECHNIQUE: Multidetector CT imaging of the head and cervical spine was performed following the standard protocol without intravenous contrast. Multiplanar CT image reconstructions of the cervical spine were also generated. COMPARISON:  None. FINDINGS: CT HEAD FINDINGS Ventricles and sulci are appropriate for patient's age. No evidence for acute cortically based infarct, intracranial hemorrhage, mass lesion or mass-effect. Orbits are unremarkable. Paranasal sinuses are well aerated. Mastoid air cells are unremarkable. Calvarium is intact. CT CERVICAL SPINE FINDINGS Normal anatomic alignment. Multilevel degenerative disc disease most pronounced C4-5, C5-6 C6-7. Multilevel facet degenerative changes. Craniocervical junction is intact. Lateral masses articulate appropriately with the dens. Prevertebral soft tissues are  unremarkable. IMPRESSION: No acute intracranial process. No acute cervical spine fracture. Electronically Signed   By: Lovey Newcomer M.D.   On: 10/31/2015 15:15   Ct Foot Right Wo Contrast  Result Date: 10/31/2015 CLINICAL DATA:  Status post 6 foot fall from a ladder today with a Lisfranc injury of the right foot. Initial encounter. EXAM: CT OF THE RIGHT FOOT WITHOUT CONTRAST TECHNIQUE: Multidetector CT imaging of the right foot was performed according to the standard protocol. Multiplanar CT image reconstructions were also generated. COMPARISON:  Plain films right foot this same day. FINDINGS: There is a chip fracture off the medial corner of the base of the fourth metatarsal. Fracture fragment measures a maximum of approximately 0.5 cm. Also seen is a chip fracture off the plantar aspect of the base of the third metatarsal. Minimally comminuted fracture off the inferior and medial corner of the base of the second metatarsal is also identified. Additional chip fracture is seen off the dorsal margin of the distal lateral cuneiform. No other fracture is identified. The first, second and third metatarsals ball are laterally subluxed approximately 0.5 cm of the tarsometatarsal joints. Soft tissue swelling and hematoma are seen about the foot. The Lisfranc ligament is markedly indistinct and torn. Ligaments are not well assessed on CT. Intrinsic musculature the foot appears atrophied. IMPRESSION: Findings consistent with a Lisfranc injury with chip fractures off the second, third and fourth metatarsals and lateral cuneiform as described above. The Lisfranc ligament is torn. Evaluation of ligaments on CT scan is limited. Electronically Signed   By: Inge Rise M.D.   On: 10/31/2015 16:28   Dg Chest Port 1 View  Result Date: 10/31/2015 CLINICAL DATA:  Pain after fall EXAM: PORTABLE CHEST 1 VIEW COMPARISON:  November 22, 2014 FINDINGS: The heart size and mediastinal contours are within normal limits. Both lungs  are clear. The visualized skeletal structures are unremarkable. IMPRESSION: No active disease. Electronically Signed   By: Dorise Bullion III M.D   On: 10/31/2015 13:24   Dg Foot Complete Right  Result Date: 10/31/2015 CLINICAL DATA:  Pain after fall EXAM: RIGHT FOOT COMPLETE - 3+ VIEW COMPARISON:  None. FINDINGS: The bases of the first and second metatarsals do not align appropriately with the adjacent cuneiform bones. There also appear to be a few tiny bony fragments between the bases of the first and second metatarsals. Soft tissue swelling is identified. No other acute abnormalities. IMPRESSION: 1. Lis franc injuries at the bases of the first and second metatarsals with malalignment  in addition to tiny bony fragments. Recommend a CT scan for better evaluation. Electronically Signed   By: Dorise Bullion III M.D   On: 10/31/2015 13:27    Review of Systems  Constitutional: Negative for chills and fever.  Respiratory: Negative for shortness of breath.   Cardiovascular: Negative for chest pain and palpitations.  Gastrointestinal: Negative for nausea and vomiting.  Neurological: Negative for tingling, tremors and headaches.   Blood pressure (!) 147/72, pulse 82, temperature 98 F (36.7 C), temperature source Oral, resp. rate 18, SpO2 95 %. Physical Exam  Constitutional: He is oriented to person, place, and time. Vital signs are normal. He is cooperative. No distress.  Comfortable appearing  Musculoskeletal:  Right lower extremity  Inspection:    Patient is in a short leg splint, posterior slab only    Extensive ecchymosis and swelling to the right foot and toes are noted    Knee and hip are unremarkable Bony eval:    Exquisite tenderness palpation of his foot particularly over the first and second metatarsal    Ankle and knee are nontender Soft tissue:    Extensive ecchymosis noted to the toes as well as foot    Significant swelling to the foot is noted as well. Soft tissue does not  wrinkle with gentle compression.  ROM:    Splint was removed to place a more padded posterior and U splint. Patient can perform active ankle flexion and extension without significant pain.  Sensation:    DPN, SPN, TN sensory functions intact. Patient is somewhat hypersensitive with light touch on his foot  Motor:   Patient does have pain with movement of his toes given injury to his mid foot   Motor functions are grossly intact  Vascular:    Extremity is warm    + DP pulse    Compartments of the foot are full   Neurological: He is alert and oriented to person, place, and time.  Psychiatric: He has a normal mood and affect. His speech is normal and behavior is normal. Judgment and thought content normal. Cognition and memory are normal.  Nursing note and vitals reviewed.    Assessment/Plan:  65 year old male status post fall from ladder approximately 6 feet with right Lisfranc disruption  - Right Lisfranc disruption  The patient was placed into a more padded splint to help with soft tissue swelling   He will require surgical stabilization of this injury but I believe him to be too swollen at this point to allow for safe surgical intervention. Operating too soon would increase his risk of infection and other perioperative dictations. Feel that given his medical comorbidities including diabetes, CKD, cardiac that additional soft tissue margin would be advantageous to minimizing risk. I think he would be ready for surgery in about 7-10 days.  Patient's x-rays demonstrate satisfactory alignment. Patient can be discharged with outpatient follow-up and plan for outpatient surgery   Continue with aggressive ice and elevation as well as toe motion as tolerated   Strict nonweightbearing right lower extremity   PT and OT evaluations   Patient can follow up with Dr. Doran Durand or with Dr. handy. If he so chooses to follow-up with [place] do not be able to do surgery until August 6. Which would  still be reasonable.  - Pain management:  Continue per medicine service   Will add lyrica 75 mg twice a day  - Medical issues   Cardiac   I have a consult to cardiology to provide  some input regarding patient's anticoagulation. Again patient has been in normal sinus rhythm since November 2016. Would like some clarification in terms of perioperative anticoagulation given that patient's surgery will be done as an outpatient and not during this hospital admission  - DVT/PE prophylaxis:  Okay to send patient out on Lovenox from [facility] which may also address issue above during the perioperative period.   40 mg subcutaneous daily would be our recommendation   - Dispo:  Cardiology eval  PT OT eval  Case management assessment for DME  Likely discharge tomorrow with outpatient follow-up early next week with orthopedics   Jari Pigg, PA-C [facility] [phone] (P) 11/02/2015, 12:06 PM

## 2015-11-03 ENCOUNTER — Inpatient Hospital Stay (HOSPITAL_COMMUNITY): Payer: Medicare Other | Admitting: Certified Registered Nurse Anesthetist

## 2015-11-03 ENCOUNTER — Encounter (HOSPITAL_COMMUNITY): Payer: Self-pay | Admitting: General Practice

## 2015-11-03 ENCOUNTER — Encounter (HOSPITAL_COMMUNITY)
Admission: EM | Disposition: A | Discharge status: Home Health | Payer: Self-pay | Source: Home / Self Care | Attending: Internal Medicine

## 2015-11-03 DIAGNOSIS — E038 Other specified hypothyroidism: Secondary | ICD-10-CM

## 2015-11-03 HISTORY — PX: ORIF ANKLE FRACTURE: SHX5408

## 2015-11-03 LAB — GLUCOSE, CAPILLARY
GLUCOSE-CAPILLARY: 93 mg/dL (ref 65–99)
Glucose-Capillary: 103 mg/dL — ABNORMAL HIGH (ref 65–99)
Glucose-Capillary: 104 mg/dL — ABNORMAL HIGH (ref 65–99)
Glucose-Capillary: 103 mg/dL — ABNORMAL HIGH (ref 65–99)
Glucose-Capillary: 101 mg/dL — ABNORMAL HIGH (ref 65–99)

## 2015-11-03 LAB — SURGICAL PCR SCREEN
MRSA, PCR: NEGATIVE
STAPHYLOCOCCUS AUREUS: NEGATIVE

## 2015-11-03 LAB — BASIC METABOLIC PANEL
ANION GAP: 5 (ref 5–15)
BUN: 14 mg/dL (ref 6–20)
CALCIUM: 8.6 mg/dL — AB (ref 8.9–10.3)
CHLORIDE: 111 mmol/L (ref 101–111)
CO2: 22 mmol/L (ref 22–32)
Creatinine, Ser: 1.3 mg/dL — ABNORMAL HIGH (ref 0.61–1.24)
GFR calc non Af Amer: 56 mL/min — ABNORMAL LOW (ref >60)
GLUCOSE: 124 mg/dL — AB (ref 65–99)
Potassium: 3.9 mmol/L (ref 3.5–5.1)
Sodium: 138 mmol/L (ref 135–145)

## 2015-11-03 LAB — CBC
HEMATOCRIT: 37.9 % — AB (ref 39.0–52.0)
HEMOGLOBIN: 11.8 g/dL — AB (ref 13.0–17.0)
MCH: 25.4 pg — ABNORMAL LOW (ref 26.0–34.0)
MCHC: 31.1 g/dL (ref 30.0–36.0)
MCV: 81.7 fL (ref 78.0–100.0)
Platelets: 181 10*3/uL (ref 150–400)
RBC: 4.64 MIL/uL (ref 4.22–5.81)
RDW: 21.2 % — AB (ref 11.5–15.5)
WBC: 7.4 10*3/uL (ref 4.0–10.5)

## 2015-11-03 SURGERY — OPEN REDUCTION INTERNAL FIXATION (ORIF) ANKLE FRACTURE
Anesthesia: Regional | Laterality: Right

## 2015-11-03 MED ORDER — METHOCARBAMOL 1000 MG/10ML IJ SOLN
500.0000 mg | Freq: Four times a day (QID) | INTRAVENOUS | Status: DC | PRN
  Filled 2015-11-03: qty 5

## 2015-11-03 MED ORDER — SUCCINYLCHOLINE CHLORIDE 20 MG/ML IJ SOLN
INTRAMUSCULAR | Status: DC | PRN
  Administered 2015-11-03: 80 mg via INTRAVENOUS

## 2015-11-03 MED ORDER — FENTANYL CITRATE (PF) 100 MCG/2ML IJ SOLN
INTRAMUSCULAR | Status: DC | PRN
  Administered 2015-11-03: 50 ug via INTRAVENOUS

## 2015-11-03 MED ORDER — FENTANYL CITRATE (PF) 100 MCG/2ML IJ SOLN
INTRAMUSCULAR | Status: AC
  Administered 2015-11-03: 100 ug via INTRAVENOUS
  Filled 2015-11-03: qty 2

## 2015-11-03 MED ORDER — METOCLOPRAMIDE HCL 5 MG PO TABS: 5.0000 mg | ORAL_TABLET | Freq: Three times a day (TID) | ORAL | Status: DC | PRN

## 2015-11-03 MED ORDER — MIDAZOLAM HCL 2 MG/2ML IJ SOLN
INTRAMUSCULAR | Status: AC
  Filled 2015-11-03: qty 2

## 2015-11-03 MED ORDER — SODIUM CHLORIDE 0.9 % IV SOLN
INTRAVENOUS | Status: DC
  Administered 2015-11-03: 21:00:00 via INTRAVENOUS

## 2015-11-03 MED ORDER — 0.9 % SODIUM CHLORIDE (POUR BTL) OPTIME
TOPICAL | Status: DC | PRN
  Administered 2015-11-03: 1000 mL

## 2015-11-03 MED ORDER — LACTATED RINGERS IV SOLN
INTRAVENOUS | Status: DC
  Administered 2015-11-03 (×2): via INTRAVENOUS

## 2015-11-03 MED ORDER — ACETAMINOPHEN 325 MG PO TABS: 325.0000 mg | ORAL_TABLET | ORAL | Status: DC | PRN

## 2015-11-03 MED ORDER — ACETAMINOPHEN 160 MG/5ML PO SOLN
325.0000 mg | ORAL | Status: DC | PRN
  Filled 2015-11-03: qty 20.3

## 2015-11-03 MED ORDER — ACETAMINOPHEN 650 MG RE SUPP: 650.0000 mg | Freq: Four times a day (QID) | RECTAL | Status: DC | PRN

## 2015-11-03 MED ORDER — ACETAMINOPHEN 325 MG PO TABS
650.0000 mg | ORAL_TABLET | Freq: Four times a day (QID) | ORAL | Status: DC | PRN
  Administered 2015-11-04: 650 mg via ORAL
  Filled 2015-11-03: qty 2

## 2015-11-03 MED ORDER — FENTANYL CITRATE (PF) 100 MCG/2ML IJ SOLN
100.0000 ug | Freq: Once | INTRAMUSCULAR | Status: AC
  Administered 2015-11-03: 100 ug via INTRAVENOUS

## 2015-11-03 MED ORDER — METHOCARBAMOL 500 MG PO TABS
500.0000 mg | ORAL_TABLET | Freq: Four times a day (QID) | ORAL | Status: DC | PRN
  Filled 2015-11-03: qty 1

## 2015-11-03 MED ORDER — MIDAZOLAM HCL 2 MG/2ML IJ SOLN
2.0000 mg | Freq: Once | INTRAMUSCULAR | Status: AC
  Administered 2015-11-03: 2 mg via INTRAVENOUS

## 2015-11-03 MED ORDER — EPHEDRINE SULFATE 50 MG/ML IJ SOLN
INTRAMUSCULAR | Status: DC | PRN
  Administered 2015-11-03: 5 mg via INTRAVENOUS

## 2015-11-03 MED ORDER — OXYCODONE HCL 5 MG PO TABS: 5.0000 mg | ORAL_TABLET | Freq: Once | ORAL | Status: DC | PRN

## 2015-11-03 MED ORDER — ONDANSETRON HCL 4 MG PO TABS: 4.0000 mg | ORAL_TABLET | Freq: Four times a day (QID) | ORAL | Status: DC | PRN

## 2015-11-03 MED ORDER — HYDROMORPHONE HCL 1 MG/ML IJ SOLN: 0.2500 mg | INTRAMUSCULAR | Status: DC | PRN

## 2015-11-03 MED ORDER — CEFAZOLIN IN D5W 1 GM/50ML IV SOLN
1.0000 g | Freq: Four times a day (QID) | INTRAVENOUS | Status: AC
  Administered 2015-11-03 – 2015-11-04 (×3): 1 g via INTRAVENOUS
  Filled 2015-11-03 (×3): qty 50

## 2015-11-03 MED ORDER — PHENYLEPHRINE HCL 10 MG/ML IJ SOLN
INTRAMUSCULAR | Status: DC | PRN
  Administered 2015-11-03: 80 ug via INTRAVENOUS
  Administered 2015-11-03: 120 ug via INTRAVENOUS

## 2015-11-03 MED ORDER — ALBUTEROL SULFATE HFA 108 (90 BASE) MCG/ACT IN AERS
INHALATION_SPRAY | RESPIRATORY_TRACT | Status: DC | PRN
  Administered 2015-11-03 (×2): 2 via RESPIRATORY_TRACT

## 2015-11-03 MED ORDER — ONDANSETRON HCL 4 MG/2ML IJ SOLN: 4.0000 mg | Freq: Four times a day (QID) | INTRAMUSCULAR | Status: DC | PRN

## 2015-11-03 MED ORDER — METOCLOPRAMIDE HCL 5 MG/ML IJ SOLN: 5.0000 mg | Freq: Three times a day (TID) | INTRAMUSCULAR | Status: DC | PRN

## 2015-11-03 MED ORDER — PROPOFOL 10 MG/ML IV BOLUS
INTRAVENOUS | Status: DC | PRN
  Administered 2015-11-03: 150 mg via INTRAVENOUS
  Administered 2015-11-03: 200 mg via INTRAVENOUS

## 2015-11-03 MED ORDER — MIDAZOLAM HCL 2 MG/2ML IJ SOLN
INTRAMUSCULAR | Status: AC
  Administered 2015-11-03: 2 mg via INTRAVENOUS
  Filled 2015-11-03: qty 2

## 2015-11-03 MED ORDER — FENTANYL CITRATE (PF) 250 MCG/5ML IJ SOLN
INTRAMUSCULAR | Status: AC
  Filled 2015-11-03: qty 5

## 2015-11-03 MED ORDER — LIDOCAINE HCL (CARDIAC) 20 MG/ML IV SOLN
INTRAVENOUS | Status: DC | PRN
  Administered 2015-11-03: 80 mg via INTRAVENOUS

## 2015-11-03 MED ORDER — OXYCODONE HCL 5 MG/5ML PO SOLN: 5.0000 mg | Freq: Once | ORAL | Status: DC | PRN

## 2015-11-03 SURGICAL SUPPLY — 46 items
BANDAGE ESMARK 6X9 LF (GAUZE/BANDAGES/DRESSINGS) IMPLANT
BIT DRILL CANN 3.2 (BIT) ×1 IMPLANT
BNDG COHESIVE 4X5 TAN STRL (GAUZE/BANDAGES/DRESSINGS) ×3 IMPLANT
BNDG ESMARK 6X9 LF (GAUZE/BANDAGES/DRESSINGS)
BNDG GAUZE ELAST 4 BULKY (GAUZE/BANDAGES/DRESSINGS) IMPLANT
CANISTER WOUND CARE 500ML ATS (WOUND CARE) ×3 IMPLANT
COVER SURGICAL LIGHT HANDLE (MISCELLANEOUS) ×6 IMPLANT
DRAPE INCISE IOBAN 66X45 STRL (DRAPES) ×3 IMPLANT
DRAPE OEC MINIVIEW 54X84 (DRAPES) IMPLANT
DRAPE PROXIMA HALF (DRAPES) ×3 IMPLANT
DRAPE U-SHAPE 47X51 STRL (DRAPES) ×3 IMPLANT
DRILL BIT CANN 3.2 (BIT) ×3
DRSG ADAPTIC 3X8 NADH LF (GAUZE/BANDAGES/DRESSINGS) IMPLANT
DRSG PAD ABDOMINAL 8X10 ST (GAUZE/BANDAGES/DRESSINGS) IMPLANT
DURAPREP 26ML APPLICATOR (WOUND CARE) ×3 IMPLANT
ELECT REM PT RETURN 9FT ADLT (ELECTROSURGICAL) ×3
ELECTRODE REM PT RTRN 9FT ADLT (ELECTROSURGICAL) ×1 IMPLANT
GAUZE SPONGE 4X4 12PLY STRL (GAUZE/BANDAGES/DRESSINGS) IMPLANT
GLOVE BIOGEL PI IND STRL 9 (GLOVE) ×1 IMPLANT
GLOVE BIOGEL PI INDICATOR 9 (GLOVE) ×2
GLOVE INDICATOR 6.5 STRL GRN (GLOVE) ×3 IMPLANT
GLOVE SURG ORTHO 9.0 STRL STRW (GLOVE) ×3 IMPLANT
GLOVE SURG SS PI 6.5 STRL IVOR (GLOVE) ×3 IMPLANT
GOWN STRL REUS W/ TWL XL LVL3 (GOWN DISPOSABLE) ×1 IMPLANT
GOWN STRL REUS W/TWL XL LVL3 (GOWN DISPOSABLE) ×2
GUIDEWIRE NON THREAD 1.6MM (WIRE) ×9 IMPLANT
KIT BASIN OR (CUSTOM PROCEDURE TRAY) ×3 IMPLANT
KIT ROOM TURNOVER OR (KITS) ×3 IMPLANT
MANIFOLD NEPTUNE II (INSTRUMENTS) IMPLANT
NS IRRIG 1000ML POUR BTL (IV SOLUTION) ×3 IMPLANT
PACK ORTHO EXTREMITY (CUSTOM PROCEDURE TRAY) ×3 IMPLANT
PAD ARMBOARD 7.5X6 YLW CONV (MISCELLANEOUS) ×6 IMPLANT
PREVENA INCISION MGT 90 150 (MISCELLANEOUS) ×3 IMPLANT
SCREW HEADLESS 4.5X46MM (Screw) ×3 IMPLANT
SCREW HEADLESS COMP 4.5X42 (Screw) ×6 IMPLANT
SPONGE LAP 18X18 X RAY DECT (DISPOSABLE) ×3 IMPLANT
STAPLER VISISTAT 35W (STAPLE) IMPLANT
SUCTION FRAZIER HANDLE 10FR (MISCELLANEOUS) ×2
SUCTION TUBE FRAZIER 10FR DISP (MISCELLANEOUS) ×1 IMPLANT
SUT ETHILON 2 0 PSLX (SUTURE) IMPLANT
SUT VIC AB 2-0 CT1 27 (SUTURE) ×4
SUT VIC AB 2-0 CT1 TAPERPNT 27 (SUTURE) ×2 IMPLANT
TOWEL OR 17X24 6PK STRL BLUE (TOWEL DISPOSABLE) ×3 IMPLANT
TOWEL OR 17X26 10 PK STRL BLUE (TOWEL DISPOSABLE) ×3 IMPLANT
TUBE CONNECTING 12'X1/4 (SUCTIONS) ×1
TUBE CONNECTING 12X1/4 (SUCTIONS) ×2 IMPLANT

## 2015-11-03 NOTE — Progress Notes (Signed)
Orthopedic Tech Progress Note Patient Details:  [name] [name] 05-16-1950 [id number]  Ortho Devices Type of Ortho Device: Postop shoe/boot Splint Material: Fiberglass Ortho Device/Splint Location: rle Ortho Device/Splint Interventions: Ordered, Application   Karolee Stamps 11/03/2015, 9:02 PM

## 2015-11-03 NOTE — Op Note (Signed)
10/31/2015 - 11/03/2015  6:46 PM  PATIENT:  [name] [name]    PRE-OPERATIVE DIAGNOSIS:  Lisfranc fracture dislocation right foot  POST-OPERATIVE DIAGNOSIS:  Same  PROCEDURE:  ORIF RIGHT LISFRANC FOOT  Application of Prevena wound VAC.  SURGEON:  Newt Minion, MD  PHYSICIAN ASSISTANT:None ANESTHESIA:   General  PREOPERATIVE INDICATIONS:  [name] is a  65 y.o. male with a diagnosis of Fx. Right Foot who failed conservative measures and elected for surgical management.    The risks benefits and alternatives were discussed with the patient preoperatively including but not limited to the risks of infection, bleeding, nerve injury, cardiopulmonary complications, the need for revision surgery, among others, and the patient was willing to proceed.  OPERATIVE IMPLANTS: 4.5 headless cannulated screws  OPERATIVE FINDINGS: Postop radiographs show stable alignment, there was no tension on the skin.  OPERATIVE PROCEDURE: Patient was brought to operating room after undergoing a popliteal block he then underwent a general anesthetic. After adequate levels anesthesia obtained patient's right lower extremity was prepped using DuraPrep draped into a sterile field a timeout was called. A dorsal incision was made over the first webspace just medial to the dorsalis pedis pulse. Dissection was carried down to the EHL tendon. The sheath was incised and the EHL tendon was retracted laterally. The base of the first metatarsal medial cuneiform joint was disrupted. The joint was reduced and stabilized with a K wire C-arm floss be verified alignment. 2 incisions were then made medially the Lisfranc joint was cleansed and reduced with a towel clip. A guidewire was then inserted from the medial cuneiform through the base of the second and third metatarsals a second K wire was then inserted from the medial cuneiform also across the base of the second and third metatarsals. C-arm floss be verified alignment.  Headless cannulated screws 46, 42, 42 mm in length were inserted C-arm floss be verified alignment. The wounds were irrigated with normal saline and the skin was closed using 2-0 nylon. A Prevena wound VAC was applied patient was extubated taken to the PACU in stable condition.  Patient will be discharged with the portable Prevena wound VAC. Strict nonweightbearing right lower extremity.

## 2015-11-03 NOTE — Transfer of Care (Signed)
Immediate Anesthesia Transfer of Care Note  Patient: [name] [name]  Procedure(s) Performed: Procedure(s): ORIF RIGHT LISFRANK FOOT (Right)  Patient Location: PACU  Anesthesia Type:General and Regional  Level of Consciousness: awake, alert , oriented and patient cooperative  Airway & Oxygen Therapy: Patient Spontanous Breathing and Patient connected to face mask oxygen  Post-op Assessment: Report given to RN and Post -op Vital signs reviewed and stable  Post vital signs: Reviewed and stable  Last Vitals:  Vitals:   11/03/15 1728 11/03/15 1915  BP: (!) 147/58 (!) 161/78  Pulse:  81  Resp:  19  Temp:  36.8 C    Last Pain:  Vitals:   11/03/15 1915  TempSrc:   PainSc: Asleep      Patients Stated Pain Goal: 3 ([id number])  Complications: No apparent anesthesia complications

## 2015-11-03 NOTE — Progress Notes (Addendum)
Patient ID: [name] [name], male   DOB: Oct 03, 1950, 65 y.o.   MRN: [id number]  PROGRESS NOTE    [name] [name]  [id number] DOB: 1950-12-20 DOA: 10/31/2015  PCP: Donnajean Lopes, MD   Brief Narrative:  65 year old male with past medical history of hypertension, paroxysmal atrial fibrillation on xarelto, chronic combined systolic and diastolic CHF (EF 123456 per echo 8/16), chronic anemia, CKD stage III, obstructive sleep apnea, hypothyroidism who presented from home after a mechanical fall from a ladder 6 feet below. He was subsequently found to have right foot fracture. Ortho plans for surgery today.   Assessment & Plan:   Principal Problem:   Foot fracture, right - Secondary to mechanical fall  - Orthopedics consulted, plan for ORIF today - Hold xarelto as pt going for surgery today (last dose 7/21)  - Seen by cardio in consultation and okay to hold xarelto for surgery  - Continue pain management efforts   Active Problems:   Atrial fibrillation ([place]) on chronic anticoagulation - [place] vasc score 4 - Rate controlled with Coreg and Cardizem - Xarelto on hold for surgery     Obstructive sleep apnea-C-pap intol - Stable      Essential hypertension  - Continue Coreg     Acute on  CKD (chronic kidney disease), stage III - Cr improving since admission with IV fluids, 1.88 --> 1.30    Chronic combined systolic and diastolic heart failure ([place]): EF 60-65% per echo 8/16 - Compensated     Type 2 diabetes mellitus with stage 3 chronic kidney disease (HCC) - Continue SSI - CBG's in past 24 hours: 144, 150, 103    Dyslipidemia associated with type 2 DM - Continue Pravachol     Hypothyroidism - TSH WNL - Continue Synthroid    Hypokalemia - Supplemented and normalized   DVT prophylaxis: SCD's bilaterally, holding xarelto for surgery  Code Status: full code  Family Communication: spouse at the bedside this am  Disposition Plan: plan for surgery today     Consultants:   Ortho   Cardiology, Dr. Daneen Schick   Procedures:   Plan for ORIF today   Antimicrobials:   None    Subjective: No overnight events.   Objective: Vitals:   11/02/15 2050 11/03/15 0100 11/03/15 0500 11/03/15 1028  BP: 136/65 (!) 155/72 (!) 117/59 120/67  Pulse: 69 80 (!) 57 64  Resp: 17 17 17 16   Temp: 99.1 F (37.3 C) 98.4 F (36.9 C) 97.7 F (36.5 C) 97.9 F (36.6 C)  TempSrc: Oral Oral Oral Oral  SpO2: 99% 96% 95% 96%    Intake/Output Summary (Last 24 hours) at 11/03/15 1224 Last data filed at 11/03/15 0500  Gross per 24 hour  Intake              480 ml  Output             2650 ml  Net            -2170 ml   There were no vitals filed for this visit.  Examination:  General exam: no distress  Respiratory system: No wheezing, bilateral air entry  Cardiovascular system: S1 & S2 heard, rate controlled   Gastrointestinal system: (+) BS, non tender  Central nervous system: No focal neurological deficits. Extremities: right foot swollen, palpable pulses  Skin: warm, dry, no lesions  Psychiatry:  Normal mood and behavior   Data Reviewed: I have personally reviewed following labs and imaging studies  CBC:  Recent  Labs Lab 10/31/15 1330 10/31/15 1338 11/01/15 0313 11/02/15 0329 11/03/15 0531  WBC 8.9  --  8.4 8.5 7.4  HGB 13.1 14.3 12.1* 11.6* 11.8*  HCT 42.3 42.0 39.2 37.2* 37.9*  MCV 80.6  --  81.7 81.2 81.7  PLT 219  --  188 190 0000000   Basic Metabolic Panel:  Recent Labs Lab 10/31/15 1330 10/31/15 1338 11/01/15 0313 11/02/15 0329 11/03/15 0531  NA 137 142 138 135 138  K 3.8 4.0 3.4* 3.8 3.9  CL 115* 111 112* 109 111  CO2 17*  --  19* 19* 22  GLUCOSE 94 89 111* 103* 124*  BUN 18 23* 17 14 14   CREATININE 1.76* 1.70* 1.46* 1.28* 1.30*  CALCIUM 9.0  --  8.6* 8.5* 8.6*   GFR: CrCl cannot be calculated (Unknown ideal weight.). Liver Function Tests:  Recent Labs Lab 10/31/15 1330  AST 20  ALT 16*  ALKPHOS 75   BILITOT 0.6  PROT 6.1*  ALBUMIN 3.6   No results for input(s): LIPASE, AMYLASE in the last 168 hours. No results for input(s): AMMONIA in the last 168 hours. Coagulation Profile:  Recent Labs Lab 10/31/15 1330  INR 1.88*   Cardiac Enzymes: No results for input(s): CKTOTAL, CKMB, CKMBINDEX, TROPONINI in the last 168 hours. BNP (last 3 results) No results for input(s): PROBNP in the last 8760 hours. HbA1C:  Recent Labs  10/31/15 1900  HGBA1C 6.7*   CBG:  Recent Labs Lab 11/02/15 0909 11/02/15 1142 11/02/15 1644 11/02/15 2204 11/03/15 0651  GLUCAP 140* 127* 144* 150* 103*   Lipid Profile: No results for input(s): CHOL, HDL, LDLCALC, TRIG, CHOLHDL, LDLDIRECT in the last 72 hours. Thyroid Function Tests:  Recent Labs  10/31/15 1945  TSH 1.402   Anemia Panel: No results for input(s): VITAMINB12, FOLATE, FERRITIN, TIBC, IRON, RETICCTPCT in the last 72 hours. Urine analysis:    Component Value Date/Time   COLORURINE YELLOW 10/31/2015 2133   APPEARANCEUR CLEAR 10/31/2015 2133   LABSPEC 1.027 10/31/2015 2133   PHURINE 5.5 10/31/2015 2133   GLUCOSEU >1000 (A) 10/31/2015 2133   HGBUR NEGATIVE 10/31/2015 2133   BILIRUBINUR NEGATIVE 10/31/2015 2133   KETONESUR 15 (A) 10/31/2015 2133   PROTEINUR NEGATIVE 10/31/2015 2133   UROBILINOGEN 1.0 11/22/2014 1337   NITRITE NEGATIVE 10/31/2015 2133   LEUKOCYTESUR NEGATIVE 10/31/2015 2133   Sepsis Labs: @LABRCNTIP (procalcitonin:4,lacticidven:4)    Recent Results (from the past 240 hour(s))  Surgical pcr screen     Status: None   Collection Time: 11/03/15  4:45 AM  Result Value Ref Range Status   MRSA, PCR NEGATIVE NEGATIVE Final   Staphylococcus aureus NEGATIVE NEGATIVE Final      Radiology Studies: Ct Abdomen Pelvis Wo Contrast Result Date: 10/31/2015 CLINICAL DATA:  1. Nonobstructive bilateral renal stones. 2. Atherosclerosis in the abdominal aorta. 3. 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 Minimal  increased attenuation in the fat adjacent to the distal descending colon could represent mild diverticulitis in the appropriate clinical setting. 4. A small 14 mm cyst in the left lateral pelvis is of doubtful significance. This could represent a lymphocele or duplication cyst. 5. The right bladder dome herniates into a right inguinal hernia. This is not of acute significance and likely chronic. 6. 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 Electronically Signed   By: Dorise Bullion III M.D   On: 10/31/2015 15:32   Dg Tibia/fibula Right Result Date: 10/31/2015 CLINICAL DATA:  Negative. Electronically Signed   By: Franki Cabot M.D.   On: 10/31/2015  16:18   Ct Head Wo Contrast Result Date: 10/31/2015 CLINICAL DATA:  No acute intracranial process. No acute cervical spine fracture.   Ct Chest Wo Contrast Result Date: 10/31/2015 CLINICAL DATA:  1. Nonobstructive bilateral renal stones. 2. Atherosclerosis in the abdominal aorta. 3. 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 Minimal increased attenuation in the fat adjacent to the distal descending colon could represent mild diverticulitis in the appropriate clinical setting. 4. A small 14 mm cyst in the left lateral pelvis is of doubtful significance. This could represent a lymphocele or duplication cyst. 5. The right bladder dome herniates into a right inguinal hernia. This is not of acute significance and likely chronic. 6. 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 Electronically Signed   By: Dorise Bullion III M.D   On: 10/31/2015 15:32   Ct Cervical Spine Wo Contrast Result Date: 10/31/2015 CLINICAL DATA:   No acute intracranial process. No acute cervical spine fracture.   Ct Foot Right Wo Contrast Result Date: 10/31/2015 CLINICAL DATA:  Findings consistent with a Lisfranc injury with chip fractures off the second, third and fourth metatarsals and lateral cuneiform as described above. The Lisfranc ligament is torn. Evaluation of ligaments on CT scan is limited.   Dg Chest Port 1  View Result Date: 10/31/2015 CLINICAL DATA: No active disease.  Dg Foot Complete Right Result Date: 10/31/2015 CLINICAL DATA:   1. Lis franc injuries at the bases of the first and second metatarsals with malalignment in addition to tiny bony fragments. Recommend a CT scan for better evaluation.      Scheduled Meds: . carvedilol  12.5 mg Oral BID  .  ceFAZolin (ANCEF) IV  2 g Intravenous On Call to OR  . DULoxetine  30 mg Oral Daily  . ferrous sulfate  325 mg Oral Q breakfast  . folic acid  1 mg Oral Daily  . imipramine  50 mg Oral QHS  . insulin aspart  0-5 Units Subcutaneous QHS  . insulin aspart  0-9 Units Subcutaneous TID WC  . levothyroxine  150 mcg Oral QAC breakfast  . pantoprazole  40 mg Oral Daily  . polycarbophil  1,250 mg Oral Daily  . potassium chloride SA  40 mEq Oral BID  . povidone-iodine  2 application Topical Once  . pravastatin  10 mg Oral QHS  . senna  1 tablet Oral BID   Continuous Infusions: . sodium chloride 75 mL/hr at 11/03/15 1033  . sodium chloride Stopped (11/02/15 0600)     LOS: 3 days    Time spent: 25 minutes  Greater than 50% of the time spent on counseling and coordinating the care.   Leisa Lenz, MD [facility] Pager [phone]  If 7PM-7AM, please contact night-coverage [email or web address] Password [facility] 11/03/2015, 12:24 PM

## 2015-11-03 NOTE — Progress Notes (Signed)
   11/03/15 1350  Clinical Encounter Type  Visited With Patient;Family  Visit Type Initial  Spiritual Encounters  Spiritual Needs Prayer  Chaplain visited with patient.  Chaplain and engaged in conversation.  Patient shared he was scheduled to have surgery that afternoon.  Chaplain prayed for patient and offered further support if needed.

## 2015-11-03 NOTE — Evaluation (Signed)
Physical Therapy Evaluation Patient Details Name: [name] [name] MRN: [id number] DOB: Feb 05, 1951 Today's Date: 11/03/2015   History of Present Illness  65 y.o. male who fell off a ladder at home while power washing his home. Pt diagnosed with Lisfranc fracture dislocation right foot and awaiting surgery. PMH: paroxsmal atrial fibrillation, obesity, HTN, DM, CKD, CHF.   Clinical Impression  Anticipate pt will progress well with mobility following surgery. Pt reports that he will be going to surgery later this afternoon. PT to continue to follow as appropriate for progression of ambulation and stairs. Equipment recommendations may need to be updated depending on how pt mobilizes following surgery.     Follow Up Recommendations No PT follow up;Supervision for mobility/OOB    Equipment Recommendations  Rolling walker with 5" wheels (will await equipment recommendations for stairs. )    Recommendations for Other Services       Precautions / Restrictions Precautions Precautions: Fall Restrictions Weight Bearing Restrictions: Yes RLE Weight Bearing: Non weight bearing      Mobility  Bed Mobility Overal bed mobility: Needs Assistance Bed Mobility: Supine to Sit;Sit to Supine     Supine to sit: Supervision Sit to supine: Supervision   General bed mobility comments: pt using rail to assist.   Transfers                 General transfer comment: not attempted at this time  Ambulation/Gait                Stairs            Wheelchair Mobility    Modified Rankin (Stroke Patients Only)       Balance Overall balance assessment: Needs assistance Sitting-balance support: No upper extremity supported Sitting balance-Leahy Scale: Good                                       Pertinent Vitals/Pain Pain Assessment: Faces Faces Pain Scale: Hurts a little bit Pain Location: Rt ankle Pain Descriptors / Indicators: Grimacing Pain  Intervention(s): Limited activity within patient's tolerance;Monitored during session;Ice applied (LE elevated)    Home Living Family/patient expects to be discharged to:: Private residence Living Arrangements: Spouse/significant other Available Help at Discharge: Available 24 hours/day Type of Home: House Home Access: Stairs to enter Entrance Stairs-Rails: Right;Left;Can reach both Entrance Stairs-Number of Steps: 4 Home Layout: Two level;Able to live on main level with bedroom/bathroom Home Equipment: None      Prior Function Level of Independence: Independent               Hand Dominance        Extremity/Trunk Assessment   Upper Extremity Assessment: Overall WFL for tasks assessed           Lower Extremity Assessment: RLE deficits/detail RLE Deficits / Details: pt able to move independently       Communication   Communication: No difficulties  Cognition Arousal/Alertness: Awake/alert Behavior During Therapy: WFL for tasks assessed/performed Overall Cognitive Status: Within Functional Limits for tasks assessed                      General Comments      Exercises        Assessment/Plan    PT Assessment Patient needs continued PT services  PT Diagnosis Difficulty walking   PT Problem List Decreased strength;Decreased range of motion;Decreased activity tolerance;Decreased balance;Decreased  mobility  PT Treatment Interventions DME instruction;Gait training;Stair training;Functional mobility training;Therapeutic activities;Therapeutic exercise;Balance training;Patient/family education   PT Goals (Current goals can be found in the Care Plan section) Acute Rehab PT Goals Patient Stated Goal: return home PT Goal Formulation: With patient Time For Goal Achievement: 11/17/15 Potential to Achieve Goals: Good    Frequency Min 5X/week   Barriers to discharge        Co-evaluation               End of Session   Activity Tolerance:  Patient tolerated treatment well Patient left: in bed;with family/visitor present (Rt LE elevated and ice applied)           Time: [id number] PT Time Calculation (min) (ACUTE ONLY): 20 min   Charges:   PT Evaluation $PT Eval Moderate Complexity: 1 Procedure     PT G Codes:        Cassell Clement, PT, CSCS Pager [phone] Office [phone]  11/03/2015, 12:29 PM

## 2015-11-03 NOTE — Interval H&P Note (Signed)
History and Physical Interval Note:  11/03/2015 5:35 PM  [name] [name]  has presented today for surgery, with the diagnosis of Fx. Right Foot  The various methods of treatment have been discussed with the patient and family. After consideration of risks, benefits and other options for treatment, the patient has consented to  Procedure(s): ORIF RIGHT LISFRANK FOOT (Right) as a surgical intervention .  The patient's history has been reviewed, patient examined, no change in status, stable for surgery.  I have reviewed the patient's chart and labs.  Questions were answered to the patient's satisfaction.     DUDA,MARCUS V

## 2015-11-03 NOTE — Anesthesia Postprocedure Evaluation (Signed)
Anesthesia Post Note  Patient: [name] [name]  Procedure(s) Performed: Procedure(s) (LRB): ORIF RIGHT LISFRANK FOOT (Right)  Patient location during evaluation: PACU Anesthesia Type: General and Regional Level of consciousness: awake and alert Pain management: pain level controlled Vital Signs Assessment: post-procedure vital signs reviewed and stable Respiratory status: spontaneous breathing, nonlabored ventilation, respiratory function stable and patient connected to nasal cannula oxygen Cardiovascular status: blood pressure returned to baseline and stable Postop Assessment: no signs of nausea or vomiting Anesthetic complications: no    Last Vitals:  Vitals:   11/03/15 2015 11/03/15 2022  BP: 119/69 114/75  Pulse: 68 65  Resp: 17 17  Temp:  37.1 C    Last Pain:  Vitals:   11/03/15 2022  TempSrc:   PainSc: Tyler Deis

## 2015-11-03 NOTE — Anesthesia Preprocedure Evaluation (Addendum)
Anesthesia Evaluation  Patient identified by MRN, date of birth, ID band Patient awake    Reviewed: Allergy & Precautions, H&P , Patient's Chart, lab work & pertinent test results, reviewed documented beta blocker date and time   Airway Mallampati: II  TM Distance: >3 FB Neck ROM: full    Dental no notable dental hx.    Pulmonary    Pulmonary exam normal breath sounds clear to auscultation       Cardiovascular  Rhythm:regular Rate:Normal     Neuro/Psych    GI/Hepatic   Endo/Other  diabetes  Renal/GU      Musculoskeletal   Abdominal   Peds  Hematology   Anesthesia Other Findings   Reproductive/Obstetrics                            Anesthesia Physical Anesthesia Plan  ASA: II  Anesthesia Plan:    Post-op Pain Management: GA combined w/ Regional for post-op pain   Induction: Intravenous  Airway Management Planned: LMA  Additional Equipment:   Intra-op Plan:   Post-operative Plan:   Informed Consent: I have reviewed the patients History and Physical, chart, labs and discussed the procedure including the risks, benefits and alternatives for the proposed anesthesia with the patient or authorized representative who has indicated his/her understanding and acceptance.   Dental Advisory Given  Plan Discussed with: CRNA and Surgeon  Anesthesia Plan Comments: (Discussed GA with LMA, possible sore throat, potential need to switch to ETT, N/V, pulmonary aspiration. Questions answered. )       Anesthesia Quick Evaluation

## 2015-11-03 NOTE — Progress Notes (Signed)
OT Cancellation Note  Patient Details Name: [name] [name] MRN: [id number] DOB: 21-May-1950   Cancelled Treatment:    Reason Eval/Treat Not Completed:  (Pt to have foot surgery today, will continue to follow.)  Malka So 11/03/2015, 3:46 PM

## 2015-11-03 NOTE — Anesthesia Procedure Notes (Addendum)
Anesthesia Regional Block:  Popliteal block  Pre-Anesthetic Checklist: ,, timeout performed, Correct Patient, Correct Site, Correct Laterality, Correct Procedure, Correct Position, site marked, Risks and benefits discussed, at surgeon's request and post-op pain management  Laterality: Right  Prep: chloraprep       Needles:   Needle Type: Echogenic Needle     Needle Length: 9cm 9 cm Needle Gauge: 21 and 21 G    Additional Needles:  Procedures: ultrasound guided (picture in chart) Popliteal block Narrative:  Injection made incrementally with aspirations every 5 mL. Anesthesiologist: Lyndle Herrlich  Additional Notes: .5% Naropin  25cc

## 2015-11-03 NOTE — Anesthesia Procedure Notes (Signed)
Procedure Name: Intubation Date/Time: 11/03/2015 6:20 PM Performed by: Shirlyn Goltz Pre-anesthesia Checklist: Patient identified, Emergency Drugs available, Suction available and Patient being monitored Patient Re-evaluated:Patient Re-evaluated prior to inductionOxygen Delivery Method: Circle system utilized Preoxygenation: Pre-oxygenation with 100% oxygen Ventilation: Mask ventilation without difficulty Laryngoscope Size: Glidescope and 4 Grade View: Grade I Tube type: Oral Tube size: 7.5 mm Number of attempts: 1 Airway Equipment and Method: Stylet Placement Confirmation: ETT inserted through vocal cords under direct vision,  positive ETCO2 and breath sounds checked- equal and bilateral Secured at: 23 cm Tube secured with: Tape Dental Injury: Teeth and Oropharynx as per pre-operative assessment  Comments: LMA not working properly- stridorous sounds and decreased Vt. Changed to OETT without issue.

## 2015-11-03 NOTE — H&P (View-Only) (Signed)
ORTHOPAEDIC CONSULTATION  REQUESTING PHYSICIAN: Robbie Lis, MD  Chief Complaint: Lisfranc fracture dislocation right foot  HPI: [name] [name] is a 65 y.o. male who presents with Lisfranc fracture dislocation right foot closed. Patient states he was on a ladder pressure washing his house with the force of the pressure washer knocked him off the ladder landed on his forefoot on the right with approximately 6 foot fall. Patient had immediate onset of pain. Injury occurred on Saturday patient was admitted splinted and has by the patient's report not been seen by orthopedic physician. Patient was a patient of [facility] has seen Dr. Lisette Grinder in the past requested Dr. Doran Durand and he is out of town patient was then referred to Dr. Maxie Better who then referred the patient to Dr. Marlou Sa and patient was also referred to Dr. Marcelino Scot. Patient states that Dr. Marcelino Scot was out of network and that he would not follow up with Dr. Marcelino Scot who had recommended surgery in 10-14 days.  Past Medical History:  Diagnosis Date  . Anemia   . Atrial flutter ([place])    a. s/p RFCA 8/13  . Chronic combined systolic and diastolic CHF (congestive heart failure) ([place])    a. LVEF previously 35% felt to be due tachycardia;  b. 02/2013 Echo: EF 50-55%, no rwma, mildly dil LA/RA, mild to mod MR. C 11/2014 echo - ef 60-65%, unable to determine DD  . CKD (chronic kidney disease), stage III    hx of a/c renal failure during episode of diverticulitis 9/13 in [place], [place]  . Diabetes mellitus ([place])   . Diverticulosis   . Erectile dysfunction   . Gastric ulcer    s/p prior surgery  . Hypertension   . Hypothyroid   . Obesities, morbid ([place])   . Obstructive sleep apnea    noncompliant with CPAP  . PAF (paroxysmal atrial fibrillation) ([place])    a. failed DCCV and multiple anti-arrhythmic drugs (multaq/flecainide);   b. s/p  PVI isolation with ablation of AFib 8/13; c. repeat PVI 01/2013;  d. 02/2013 repeat DCCV->Amio  load/xarelto.  . Renal calculi    Past Surgical History:  Procedure Laterality Date  . ATRIAL FIBRILLATION ABLATION  12/06/11; 02/05/2013   Afib and atrial flutter ablation by Dr Rayann Heman; repeat PVI by Dr Rayann Heman 02/05/2013  . ATRIAL FIBRILLATION ABLATION N/A 12/06/2011   Procedure: ATRIAL FIBRILLATION ABLATION;  Surgeon: Thompson Grayer, MD;  Location[facility] CATH LAB;  Service: Cardiovascular;  Laterality: N/A;  . ATRIAL FIBRILLATION ABLATION N/A 02/05/2013   Procedure: ATRIAL FIBRILLATION ABLATION;  Surgeon: Coralyn Mark, MD;  Location: [facility] CATH LAB;  Service: Cardiovascular;  Laterality: N/A;  . benign stomach tumor removal  2000  . CARDIOVERSION  08/25/2011   Procedure: CARDIOVERSION;  Surgeon: Jettie Booze, MD;  Location: [facility];  Service: Cardiovascular;  Laterality: N/A;  . CARDIOVERSION N/A 03/05/2013   Procedure: CARDIOVERSION;  Surgeon: Sinclair Grooms, MD;  Location: [facility];  Service: Cardiovascular;  Laterality: N/A;  BEDSIDE   . convergent afib abaltion [facility] 02/26/15  02/26/15   [facility] by Dr. Lehman Prom and Dr. Danise Mina  . ELECTROPHYSIOLOGIC STUDY N/A 11/24/2014   Procedure: Cardioversion;  Surgeon: Will Meredith Leeds, MD;  Location: [facility] CV LAB;  Service: Cardiovascular;  Laterality: N/A;  . ESOPHAGOGASTRODUODENOSCOPY N/A 04/09/2013   Procedure: ESOPHAGOGASTRODUODENOSCOPY (EGD) with possible Balloon Dilation;  Surgeon: Garlan Fair, MD;  Location: [facility] ENDOSCOPY;  Service: Endoscopy;  Laterality: N/A;  . kidney stone removal  multiple  . knee sx  1985   both  . LAPAROSCOPIC RETROPUBIC PROSTATECTOMY  02/2007   hx prostate cancer  . TEE WITHOUT CARDIOVERSION  08/25/2011   Procedure: TRANSESOPHAGEAL ECHOCARDIOGRAM (TEE);  Surgeon: Jettie Booze, MD;  Location: [facility];  Service: Cardiovascular;  Laterality: N/A;  . TEE WITHOUT CARDIOVERSION  11/09/2011   Procedure: TRANSESOPHAGEAL ECHOCARDIOGRAM (TEE);  Surgeon: Jettie Booze, MD;  Location: [facility]  ENDOSCOPY;  Service: Cardiovascular;  Laterality: N/A;  h/p in file drawer  . TEE WITHOUT CARDIOVERSION N/A 02/05/2013   Procedure: TRANSESOPHAGEAL ECHOCARDIOGRAM (TEE);  Surgeon: Candee Furbish, MD;  Location: [facility] ENDOSCOPY;  Service: Cardiovascular;  Laterality: N/A;  . THYROIDECTOMY  2009   cysts removal   . wrist sx  1980   right   Social History   Social History  . Marital status: Married    Spouse name: N/A  . Number of children: N/A  . Years of education: N/A   Social History Main Topics  . Smoking status: Never Smoker  . Smokeless tobacco: None  . Alcohol use No  . Drug use: No  . Sexual activity: Yes   Other Topics Concern  . None   Social History Narrative   Lives in [place].  Works as a Librarian, academic at [employer] History  Problem Relation Age of Onset  . Emphysema Mother   . Lung cancer Father   . Diabetes Brother   . Colon cancer Neg Hx    - negative except otherwise stated in the family history section Allergies  Allergen Reactions  . Phenergan [Promethazine Hcl] Itching    Hallucination   . Aspirin     Hx of ulcer   . Bystolic [Nebivolol Hcl] Swelling    Bradycardia   . Calcium Channel Blockers Swelling    LE edema  . Prednisone     Hx of ulcer  . Nsaids     Hx ulcer   Prior to Admission medications   Medication Sig Start Date End Date Taking? Authorizing Provider  albuterol (PROVENTIL) (2.5 MG/3ML) 0.083% nebulizer solution Take 2.5 mg by nebulization 2 (two) times daily as needed for wheezing or shortness of breath.  05/22/13  Yes Historical Provider, MD  Canagliflozin (INVOKANA) 300 MG TABS Take 1 tablet by mouth daily.   Yes Historical Provider, MD  carvedilol (COREG) 12.5 MG tablet Take 1.5 tablets (18.75 mg total) by mouth 2 (two) times daily. Patient taking differently: Take 12.5 mg by mouth 2 (two) times daily.  05/22/15  Yes Thompson Grayer, MD  Cholecalciferol (VITAMIN D) 2000 UNITS CAPS Take 2,000 Units by mouth daily.    Yes Historical Provider, MD  diltiazem (CARDIZEM) 30 MG tablet Take 1 tablet (30 mg total) by mouth as needed (Every 4 hours AS NEEDED with a HR 123XX123 and Systolic 123XX123 (BP)). [id number]  Yes Sherran Needs, NP  DULoxetine (CYMBALTA) 30 MG capsule Take 1 capsule by mouth every morning. 10/23/15  Yes Historical Provider, MD  ferrous sulfate 325 (65 FE) MG tablet Take 1 tablet (325 mg total) by mouth daily with breakfast. 11/24/14  Yes Erlene Quan, PA-C  folic acid (FOLVITE) 1 MG tablet Take 1 mg by mouth daily.   Yes Historical Provider, MD  GLUCOSAMINE PO Take 1,000 mg by mouth 2 (two) times daily.    Yes Historical Provider, MD  imipramine (TOFRANIL) 25 MG tablet Take 50 mg by mouth at bedtime.    Yes Historical Provider, MD  levothyroxine (  SYNTHROID, LEVOTHROID) 150 MCG tablet Take 150 mcg by mouth daily before breakfast.   Yes Historical Provider, MD  Multiple Vitamins-Minerals (MENS 50+ MULTI VITAMIN/MIN PO) Take 1 tablet by mouth daily.   Yes Historical Provider, MD  pantoprazole (PROTONIX) 40 MG tablet Take 40 mg by mouth daily.    Yes Historical Provider, MD  polycarbophil (FIBERCON) 625 MG tablet Take 1,250 mg by mouth daily.   Yes Historical Provider, MD  potassium chloride SA (K-DUR,KLOR-CON) 20 MEQ tablet Take 2 tablets (40 mEq total) by mouth daily. Patient taking differently: Take 40 mEq by mouth 2 (two) times daily.  11/24/14  Yes Luke K Kilroy, PA-C  pravastatin (PRAVACHOL) 20 MG tablet Take 10 mg by mouth at bedtime.    Yes Historical Provider, MD  Rivaroxaban (XARELTO) 15 MG TABS tablet Take 1 tablet (15 mg total) by mouth every evening. 05/22/15  Yes Thompson Grayer, MD   No results found. - pertinent xrays, CT, MRI studies were reviewed and independently interpreted  Positive ROS: All other systems have been reviewed and were otherwise negative with the exception of those mentioned in the HPI and as above.  Physical Exam: General: Alert, no acute distress Cardiovascular:Patient has  a history of atrial fibrillation which has been treated. Patient states he has not had atrial fibrillation several years. Patient has been on Xarelto but he is not quite sure why he still on Xarelto and he states that his cardiologist was going to take him off it. Respiratory: No cyanosis, no use of accessory musculature GI: No organomegaly, abdomen is soft and non-tender Skin: Patient has ecchymosis and bruising in the right foot he has a good dorsalis pedis pulse there is minimal swelling. There is wrinkling of the skin. Neurologic: Sensation intact distally Psychiatric: Patient is competent for consent with normal mood and affect Lymphatic: No axillary or cervical lymphadenopathy Endocrine: Patient does have type 2 diabetes which she states is under control  MUSCULOSKELETAL:  On examination patient has a strong dorsalis pedis pulse. He has no open wounds. He does have protective sensation. Patient x-rays and CT scan were reviewed which shows a Lisfranc fracture dislocation involving the Lisfranc joint transversely. The base of the fourth and fifth metatarsals are aligned and not displaced. The base of the second and first metatarsal do show fractures through the Lisfranc joint and are both displaced.  Assessment: Assessment: Lisfranc fracture dislocation right foot. With a history of controlled type 2 diabetes history of A. fib negative history for tobacco use positive history for sleep apnea  Plan: Plan: We'll plan for open reduction internal fixation Lisfranc fracture dislocation. Risks and benefits were discussed including infection neurovascular injury DVT nonhealing the wound need for additional surgery. Patient states he understands wish to proceed at this time we'll plan for a Prevena wound VAC postoperatively as well as the Vive Wear medical compression socks. Nonweightbearing postoperatively. Anticipate discharge in a day or 2 after surgery.  Thank you for the consult and the  opportunity to see [name] [name], [place] [phone] 6:01 PM

## 2015-11-03 NOTE — Anesthesia Procedure Notes (Signed)
Procedure Name: LMA Insertion Date/Time: 11/03/2015 5:43 PM Performed by: Shirlyn Goltz Pre-anesthesia Checklist: Patient identified, Emergency Drugs available, Suction available and Patient being monitored Patient Re-evaluated:Patient Re-evaluated prior to inductionOxygen Delivery Method: Circle system utilized Preoxygenation: Pre-oxygenation with 100% oxygen Intubation Type: IV induction LMA: LMA inserted LMA Size: 5.0 Placement Confirmation: positive ETCO2 and breath sounds checked- equal and bilateral Tube secured with: Tape Dental Injury: Teeth and Oropharynx as per pre-operative assessment

## 2015-11-04 ENCOUNTER — Encounter (HOSPITAL_COMMUNITY): Payer: Self-pay | Admitting: Orthopedic Surgery

## 2015-11-04 DIAGNOSIS — N183 Chronic kidney disease, stage 3 (moderate): Secondary | ICD-10-CM

## 2015-11-04 DIAGNOSIS — S92901D Unspecified fracture of right foot, subsequent encounter for fracture with routine healing: Secondary | ICD-10-CM

## 2015-11-04 DIAGNOSIS — I5042 Chronic combined systolic (congestive) and diastolic (congestive) heart failure: Secondary | ICD-10-CM

## 2015-11-04 DIAGNOSIS — E1122 Type 2 diabetes mellitus with diabetic chronic kidney disease: Secondary | ICD-10-CM

## 2015-11-04 LAB — GLUCOSE, CAPILLARY
GLUCOSE-CAPILLARY: 134 mg/dL — AB (ref 65–99)
Glucose-Capillary: 131 mg/dL — ABNORMAL HIGH (ref 65–99)
Glucose-Capillary: 105 mg/dL — ABNORMAL HIGH (ref 65–99)

## 2015-11-04 MED ORDER — POLYETHYLENE GLYCOL 3350 17 G PO PACK: 17.0000 g | PACK | Freq: Every day | ORAL | 0 refills | Status: DC | PRN

## 2015-11-04 MED ORDER — HYDROCODONE-ACETAMINOPHEN 5-325 MG PO TABS: 2.0000 | ORAL_TABLET | Freq: Four times a day (QID) | ORAL | 0 refills | Status: DC | PRN

## 2015-11-04 NOTE — Progress Notes (Signed)
Physical Therapy Treatment Patient Details Name: [name] [name] MRN: [id number] DOB: Aug 11, 1950 Today's Date: 11/04/2015    History of Present Illness 65 y.o. male who fell off a ladder at home while power washing his home. Pt diagnosed with Lisfranc fracture dislocation right foot and s/p ORIF/wound vac. PMH: paroxsmal atrial fibrillation, obesity, HTN, DM, CKD, CHF.     PT Comments    Pt reporting that they have arranged for a ramp and w/c to use for entering their home. Pt and family educated on safe use of w/c on ramp and on stairs if needed. Pt/family verbalized understanding and denied any questions or concerns. Also performed a trial of knee scooter per pt request, states that he will stick with the rw for now.    Follow Up Recommendations  Home health PT;Supervision for mobility/OOB     Equipment Recommendations  Rolling walker with 5" wheels;3in1 (PT)    Recommendations for Other Services       Precautions / Restrictions Precautions Precautions: Fall Precaution Comments: wound vac Required Braces or Orthoses: Other Brace/Splint Other Brace/Splint: post-op shoe Restrictions Weight Bearing Restrictions: Yes RLE Weight Bearing: Non weight bearing    Mobility  Bed Mobility               General bed mobility comments: up in chair  Transfers Overall transfer level: Needs assistance Equipment used:  (knee scooter) Transfers: Sit to/from Stand Sit to Stand: Min guard         General transfer comment: reminder for hand position  Ambulation/Gait Ambulation/Gait assistance: Min guard Ambulation Distance (Feet): 20 Feet Assistive device:  (knee scooter)       General Gait Details: Using scooter with emphasis on safety of use.    Stairs         General stair comments: Pt confirmed that they do have a ramp for home and will enter using thier w/c. Reviewed how to propel w/c up ramp with assist and also reviewed how to bump up stairs with w/c incase  ramp does not work. Pt/ family educated hand handout provided.   Wheelchair Mobility    Modified Rankin (Stroke Patients Only)       Balance Overall balance assessment: Needs assistance Sitting-balance support: No upper extremity supported Sitting balance-Leahy Scale: Good     Standing balance support: Single extremity supported Standing balance-Leahy Scale: Fair                      Cognition Arousal/Alertness: Awake/alert Behavior During Therapy: WFL for tasks assessed/performed Overall Cognitive Status: Within Functional Limits for tasks assessed                      Exercises      General Comments General comments (skin integrity, edema, etc.): Pt confirms that they have a w/c and ramp for home.       Pertinent Vitals/Pain Pain Assessment: No/denies pain    Home Living                      Prior Function            PT Goals (current goals can now be found in the care plan section) Acute Rehab PT Goals Patient Stated Goal: go home PT Goal Formulation: With patient Time For Goal Achievement: 11/17/15 Potential to Achieve Goals: Good Progress towards PT goals: Progressing toward goals    Frequency  Min 5X/week    PT Plan Current  plan remains appropriate    Co-evaluation             End of Session Equipment Utilized During Treatment: Gait belt Activity Tolerance: Patient tolerated treatment well Patient left: in chair;with call bell/phone within reach (LE elevated)     Time: [id number] PT Time Calculation (min) (ACUTE ONLY): 25 min  Charges:  $Therapeutic Activity: 23-37 mins                    G Codes:      Cassell Clement, PT, CSCS Pager [phone] Office [phone]  11/04/2015, 4:12 PM

## 2015-11-04 NOTE — Discharge Instructions (Signed)
No weight bearing on rt leg. Keep right foot elevated while at rest.

## 2015-11-04 NOTE — Care Management Note (Signed)
Case Management Note  Patient Details  Name: RASHEEM MONEGRO MRN: [id number] Date of Birth: 02/23/1951  Subjective/Objective:  65 yr old gentleman s/p ORIF of right foot fracture.                  Action/Plan:  Case manager spoke with patient and his daughter concerning home health and DME needs. Choice was offered for [place]. Referral was called to Christa See, [facility]. Patient will have family support at discharge, will go home with a prevena wound vac.     Expected Discharge Date:   11/04/15               Expected Discharge Plan:  [place]  In-House Referral:  NA  Discharge planning Services  CM Consult  Post Acute Care Choice:  Durable Medical Equipment, Home Health Choice offered to:  Patient  DME Arranged:  3-N-1, Walker rolling DME Agency:  [place]:  PT [place]:  [facility] (now [facility])  Status of Service:  Completed, signed off  If discussed at [employer] of Stay Meetings, dates discussed:    Additional Comments:  Ninfa Meeker, RN 11/04/2015, 3:13 PM

## 2015-11-04 NOTE — Evaluation (Signed)
Occupational Therapy Evaluation Patient Details Name: [name] [name] MRN: [id number] DOB: 1950/05/15 Today's Date: 11/04/2015    History of Present Illness 65 y.o. male who fell off a ladder at home while power washing his home. Pt diagnosed with Lisfranc fracture dislocation right foot and s/p ORIF/wound vac. PMH: paroxsmal atrial fibrillation, obesity, HTN, DM, CKD, CHF.    Clinical Impression   Pt was admitted for the above. All education was completed.  No further OT is needed at this time    Follow Up Recommendations  Supervision/Assistance - 24 hour    Equipment Recommendations  3 in 1 bedside comode (wife will look into tub bench)    Recommendations for Other Services       Precautions / Restrictions Precautions Precautions: Fall Restrictions RLE Weight Bearing: Non weight bearing      Mobility Bed Mobility               General bed mobility comments: oob  Transfers                 General transfer comment:  (not performed; up with NT earlier and PT.  )    Balance                                            ADL Overall ADL's : Needs assistance/impaired     Grooming: Set up;Sitting   Upper Body Bathing: Set up;Sitting   Lower Body Bathing: Minimal assistance;Sit to/from stand   Upper Body Dressing : Set up;Sitting   Lower Body Dressing: Moderate assistance;Sit to/from stand                 General ADL Comments: wife will assist with adls as needed:  Pt will go home with a small wound vac.   She is considering tub bench for home:  MD told them that pt can shower in a week when wound vac is removed.       Vision     Perception     Praxis      Pertinent Vitals/Pain Pain Assessment: No/denies pain     Hand Dominance     Extremity/Trunk Assessment Upper Extremity Assessment Upper Extremity Assessment: Overall WFL for tasks assessed           Communication Communication Communication: No  difficulties   Cognition Arousal/Alertness: Awake/alert Behavior During Therapy: WFL for tasks assessed/performed Overall Cognitive Status: Within Functional Limits for tasks assessed                     General Comments       Exercises       Shoulder Instructions      Home Living Family/patient expects to be discharged to:: Private residence Living Arrangements: Spouse/significant other Available Help at Discharge: Available 24 hours/day Type of Home: House Home Access: Stairs to enter [employer] of Steps: 4         Bathroom Shower/Tub: Tub/shower unit Shower/tub characteristics: Architectural technologist: Standard     Home Equipment: None          Prior Functioning/Environment Level of Independence: Independent             OT Diagnosis: Generalized weakness   OT Problem List:     OT Treatment/Interventions:      OT Goals(Current goals can be found in the care plan section)  Acute Rehab OT Goals Patient Stated Goal: return home OT Goal Formulation: All assessment and education complete, DC therapy  OT Frequency:     Barriers to D/C:            Co-evaluation              End of Session    Activity Tolerance: Patient tolerated treatment well Patient left: in chair;with family/visitor present;with call bell/phone within reach   Time: 1035-1058 OT Time Calculation (min): 23 min Charges:  OT General Charges $OT Visit: 1 Procedure OT Evaluation $OT Eval Low Complexity: 1 Procedure G-Codes:    Shiesha Jahn 2015-11-12, 12:05 PM Lesle Chris, OTR/L [phone] 11/12/2015

## 2015-11-04 NOTE — Progress Notes (Signed)
Physical Therapy Treatment Patient Details Name: [name] [name] MRN: [id number] DOB: July 09, 1950 Today's Date: 11/04/2015    History of Present Illness 65 y.o. male who fell off a ladder at home while power washing his home. Pt diagnosed with Lisfranc fracture dislocation right foot and s/p ORIF/wound vac. PMH: paroxsmal atrial fibrillation, obesity, HTN, DM, CKD, CHF.     PT Comments    Pt having difficulty with stairs at this time. Pt able to go up/down 1 step but unable to go any further due to reports of feeling unsteady and unable. Discussed options for technique and devices for improved safety. Pt/spouse contacting a friend who has a portable ramp that they may be able to use. Will check back to finalize plan for going up his stairs at home.   Follow Up Recommendations  Home health PT;Supervision for mobility/OOB     Equipment Recommendations  Rolling walker with 5" wheels;3in1 (PT)    Recommendations for Other Services       Precautions / Restrictions Precautions Precautions: Fall Precaution Comments: wound vac Required Braces or Orthoses: Other Brace/Splint Other Brace/Splint: post-op shoe Restrictions Weight Bearing Restrictions: Yes RLE Weight Bearing: Non weight bearing    Mobility  Bed Mobility               General bed mobility comments: in chair upon arrival  Transfers Overall transfer level: Needs assistance Equipment used: Rolling walker (2 wheeled);Crutches Transfers: Sit to/from Stand Sit to Stand: Min guard         General transfer comment: performing with rw and crutches. Poor stability with crutches, recommending use of rw.   Ambulation/Gait Ambulation/Gait assistance: Min guard Ambulation Distance (Feet): 25 Feet (x2) Assistive device: Rolling walker (2 wheeled) Gait Pattern/deviations:  (swing-to pattern) Gait velocity: decreased   General Gait Details: Consistent with NWB on Rt. Pt requesting trial of crutches, pt with poor  stability.    Stairs Stairs: Yes Stairs assistance: Mod assist Stair Management: One rail Left Number of Stairs: 1 General stair comments: Pt reports not feeling safe with stairs, attempted with rail and crutch. Pt with poor stability. Spouse reports that they may have access to a portable ramp but will have to call and check.   Wheelchair Mobility    Modified Rankin (Stroke Patients Only)       Balance Overall balance assessment: Needs assistance Sitting-balance support: No upper extremity supported Sitting balance-Leahy Scale: Good     Standing balance support: Bilateral upper extremity supported Standing balance-Leahy Scale: Poor Standing balance comment: using rw for support                    Cognition Arousal/Alertness: Awake/alert Behavior During Therapy: WFL for tasks assessed/performed Overall Cognitive Status: Within Functional Limits for tasks assessed                      Exercises      General Comments        Pertinent Vitals/Pain Pain Assessment: No/denies pain    Home Living            Prior Function          PT Goals (current goals can now be found in the care plan section) Acute Rehab PT Goals Patient Stated Goal: return home PT Goal Formulation: With patient Time For Goal Achievement: 11/17/15 Potential to Achieve Goals: Good Progress towards PT goals: Progressing toward goals    Frequency  Min 5X/week    PT Plan  Current plan remains appropriate    Co-evaluation             End of Session Equipment Utilized During Treatment: Gait belt Activity Tolerance: Patient limited by fatigue Patient left: in chair;with call bell/phone within reach;with family/visitor present (LE elevated)     Time: [id number] PT Time Calculation (min) (ACUTE ONLY): 44 min  Charges:  $Gait Training: 38-52 mins                    G Codes:      Cassell Clement, PT, CSCS Pager [phone] Office [phone]  11/04/2015,  12:20 PM

## 2015-11-04 NOTE — Progress Notes (Signed)
Patient ID: [name] [name], male   DOB: 02-Sep-1950, 65 y.o.   MRN: [id number] Postoperative day 1 status post open reduction internal fixation Lisfranc fracture dislocation right foot. Patient states he has no pain postoperatively. The wound VAC is functioning well there is no drainage. Plan for physical therapy progressive ambulation nonweightbearing on the right. Patient may be discharged to home as seen as he can independently ambulate nonweightbearing on the right. I will follow-up in the office in 1 week. Patient to be discharged with the portable Prevena wound VAC.

## 2015-11-04 NOTE — Discharge Summary (Signed)
Physician Discharge Summary  MASE CASSANI [id number] DOB: 1951-02-18 DOA: 10/31/2015  PCP: Donnajean Lopes, MD  Admit date: 10/31/2015 Discharge date: 11/04/2015  Admitted From: Home Disposition:  Home with RN and PT  Recommendations for Outpatient Follow-up:  1. Follow up with PCP in 1-2 weeks 2. Follow-up with orthopedics Dr. Sharol Given in 1 week.  Home Health: Yes (PT and RN) Equipment/Devices: Conservation officer, nature  Discharge Condition: Stable CODE STATUS: Full code Diet recommendation: Heart Healthy / Carb Modified     Discharge Diagnoses:  Principal Problem:   Foot fracture, right   Active Problems:   Atrial fibrillation (HCC)   Obstructive sleep apnea-C-pap intol   Hypertension   CKD (chronic kidney disease), stage III   Chronic combined systolic and diastolic heart failure (HCC)   Chronic anticoagulation-Xarelto   Type 2 diabetes mellitus with stage 3 chronic kidney disease (HCC)   Hypothyroidism   Chronic combined systolic and diastolic CHF (congestive heart failure) (HCC)  Brief narrative/history of present illness Please refer to admission H&P for details, in brief, 65 year old male with past medical history of hypertension, paroxysmal atrial fibrillation on xarelto, chronic combined systolic and diastolic CHF (EF [id number] per echo 8/16), chronic anemia, CKD stage III, obstructive sleep apnea, hypothyroidism who presented from home after a mechanical fall from a ladder 6 feet below. He was subsequently found to have right foot fracture. Patient seen by orthopedics and underwent ORIF of the right foot on 7/25.  Hospital course Principal Problem: Foot fracture, right - Secondary to mechanical fall . Xray showed Lisc frank injury at the base of 1st and 2nd metatarsal with malalignment. - Orthopedics consulted, went ORIF on 7/25 and wound VAC placed. Antibiotics and was held for surgery and resumed postoperatively. -Seen by Dr. Sharol Given this morning and recommends  nonweightbearing on right leg and discharge home with portable prevena wound VAC. I would prescribe him with some pain medications. Patient will follow-up with Dr. Sharol Given in 1 week. Seen by PT and OT. Recommend 24-hour supervision and 15 1 bedside commode which will be ordered.    Active Problems: Atrial fibrillation (HCC) on chronic anticoagulation - [place] vasc score 4 - Rate controlled with Coreg and Cardizem -Xarelto resumed after surgery.  Obstructive sleep apnea on CPAP - Stable    Essential hypertension  - Continue Coreg     Acute on CKD (chronic kidney disease), stage III - Given IV hydration and improved  Chronic combined systolic and diastolic heart failure ([place]): EF 60-65% per echo 8/16 - Compensated . Continue home medications  Type 2 diabetes mellitus with stage 3 chronic kidney disease ([place]) -Stable. Resume home medications    Dyslipidemia associated with type 2 DM - Continue Pravachol   Hypothyroidism - TSH normal. Continue Synthroid.    Hypokalemia -Replenished   Family Communication: None at the bedside  Disposition Plan:  home  Discharge Instructions  Discharge Instructions    Elevate operative extremity    Complete by:  As directed   Neg Press Wound Therapy / Incisional    Complete by:  As directed   Non weight bearing    Complete by:  As directed   Laterality:  right   Extremity:  Lower   Post-op shoe    Complete by:  As directed       Medication List    TAKE these medications   albuterol (2.5 MG/3ML) 0.083% nebulizer solution Commonly known as:  PROVENTIL Take 2.5 mg by nebulization 2 (two) times daily as needed for  wheezing or shortness of breath.   carvedilol 12.5 MG tablet Commonly known as:  COREG Take 1.5 tablets (18.75 mg total) by mouth 2 (two) times daily. What changed:  how much to take   diltiazem 30 MG tablet Commonly known as:  CARDIZEM Take 1 tablet (30 mg total) by mouth as needed (Every 4 hours AS  NEEDED with a HR 123XX123 and Systolic 123XX123 (BP)).   DULoxetine 30 MG capsule Commonly known as:  CYMBALTA Take 1 capsule by mouth every morning.   ferrous sulfate 325 (65 FE) MG tablet Take 1 tablet (325 mg total) by mouth daily with breakfast.   folic acid 1 MG tablet Commonly known as:  FOLVITE Take 1 mg by mouth daily.   GLUCOSAMINE PO Take 1,000 mg by mouth 2 (two) times daily.   HYDROcodone-acetaminophen 5-325 MG tablet Commonly known as:  NORCO/VICODIN Take 2 tablets by mouth every 6 (six) hours as needed for moderate pain.   imipramine 25 MG tablet Commonly known as:  TOFRANIL Take 50 mg by mouth at bedtime.   INVOKANA 300 MG Tabs tablet Generic drug:  canagliflozin Take 1 tablet by mouth daily.   levothyroxine 150 MCG tablet Commonly known as:  SYNTHROID, LEVOTHROID Take 150 mcg by mouth daily before breakfast.   MENS 50+ MULTI VITAMIN/MIN PO Take 1 tablet by mouth daily.   pantoprazole 40 MG tablet Commonly known as:  PROTONIX Take 40 mg by mouth daily.   polycarbophil 625 MG tablet Commonly known as:  FIBERCON Take 1,250 mg by mouth daily.   polyethylene glycol packet Commonly known as:  MIRALAX / GLYCOLAX Take 17 g by mouth daily as needed for mild constipation.   potassium chloride SA 20 MEQ tablet Commonly known as:  K-DUR,KLOR-CON Take 2 tablets (40 mEq total) by mouth daily. What changed:  when to take this   pravastatin 20 MG tablet Commonly known as:  PRAVACHOL Take 10 mg by mouth at bedtime.   Rivaroxaban 15 MG Tabs tablet Commonly known as:  XARELTO Take 1 tablet (15 mg total) by mouth every evening.   Vitamin D 2000 units Caps Take 2,000 Units by mouth daily.      Follow-up Information    DUDA,MARCUS V, MD Follow up in 1 week(s).   Specialty:  Orthopedic Surgery Contact information: [place] [address] [phone]        Donnajean Lopes, MD. Schedule an appointment as soon as possible for a visit in  2 week(s).   Specialty:  Internal Medicine Contact information: [place] [address] [phone]          Allergies  Allergen Reactions  . Phenergan [Promethazine Hcl] Itching    Hallucination   . Aspirin     Hx of ulcer   . Bystolic [Nebivolol Hcl] Swelling    Bradycardia   . Calcium Channel Blockers Swelling    LE edema  . Prednisone     Hx of ulcer  . Nsaids     Hx ulcer    Consultations:  Orthopedics ( Dr Sharol Given)   Procedures/Studies: Ct Abdomen Pelvis Wo Contrast  Result Date: 10/31/2015 CLINICAL DATA:  Pain after trauma. EXAM: CT CHEST, ABDOMEN AND PELVIS WITHOUT CONTRAST TECHNIQUE: Multidetector CT imaging of the chest, abdomen and pelvis was performed following the standard protocol without IV contrast. COMPARISON:  CT of the chest January 08, 2011 FINDINGS: CT CHEST The central airways are normal. No pneumothorax. No pulmonary not, masses, or focal infiltrates.  The thoracic aorta is normal in caliber measuring 3.9 cm. No aneurysm. The central pulmonary arteries are normal in caliber as well. There are coronary artery calcifications. The heart size is normal. No effusions. No adenopathy. CT ABDOMEN AND PELVIS No free air or free fluid. Evaluation of parenchymal organs is limited without contrast. The liver, gallbladder, spleen, adrenal glands, and pancreas are unremarkable. There is fatty deposition within the pancreatic head. Bilateral renal stones are seen, most numerous in the left lower pole. No hydronephrosis or perinephric stranding. No ureterectasis or ureteral stones. Atherosclerosis is seen in the abdominal aorta which is non aneurysmal. No adenopathy. A suture line is associated with the stomach consistent previous gastric surgery. The stomach and small bowel are otherwise normal. The colon demonstrates colonic diverticuli. Slight increased attenuation in the fat adjacent to the distal descending colon could represent very subtle  diverticulitis. The remainder of the colon is normal. The appendix is unremarkable. The pelvis demonstrates a small cyst on the left on series 7, image 246 measuring 14 mm. This is of doubtful acute significance. No suspicious adenopathy or mass. The right bladder dome herniates into a right inguinal hernia. The remainder of the bladder is normal. No other abnormalities in the pelvis. Subcortical sclerosis in the femoral head suggests small regions of DVT and. No acute bony abnormalities. There is a fat containing umbilical hernia. Another fat containing hernia is seen anteriorly, above the umbilicus. IMPRESSION: 1. Nonobstructive bilateral renal stones. 2. Atherosclerosis in the abdominal aorta. 3. 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 Minimal increased attenuation in the fat adjacent to the distal descending colon could represent mild diverticulitis in the appropriate clinical setting. 4. A small 14 mm cyst in the left lateral pelvis is of doubtful significance. This could represent a lymphocele or duplication cyst. 5. The right bladder dome herniates into a right inguinal hernia. This is not of acute significance and likely chronic. 6. 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 Electronically Signed   By: Dorise Bullion III M.D   On: 10/31/2015 15:32   Dg Tibia/fibula Right  Result Date: 10/31/2015 CLINICAL DATA:  Distal tib-fib pain after fall from ladder this afternoon. History of right ankle fracture. EXAM: RIGHT TIBIA AND FIBULA - 2 VIEW COMPARISON:  None. FINDINGS: There is no evidence of fracture or other focal bone lesions. Soft tissues are unremarkable. IMPRESSION: Negative. Electronically Signed   By: Franki Cabot M.D.   On: 10/31/2015 16:18   Ct Head Wo Contrast  Result Date: 10/31/2015 CLINICAL DATA:  Patient status post fall from ladder, 60 high. No reported loss of consciousness. Alert and oriented. EXAM: CT HEAD WITHOUT CONTRAST CT CERVICAL SPINE WITHOUT CONTRAST TECHNIQUE: Multidetector CT imaging  of the head and cervical spine was performed following the standard protocol without intravenous contrast. Multiplanar CT image reconstructions of the cervical spine were also generated. COMPARISON:  None. FINDINGS: CT HEAD FINDINGS Ventricles and sulci are appropriate for patient's age. No evidence for acute cortically based infarct, intracranial hemorrhage, mass lesion or mass-effect. Orbits are unremarkable. Paranasal sinuses are well aerated. Mastoid air cells are unremarkable. Calvarium is intact. CT CERVICAL SPINE FINDINGS Normal anatomic alignment. Multilevel degenerative disc disease most pronounced C4-5, C5-6 C6-7. Multilevel facet degenerative changes. Craniocervical junction is intact. Lateral masses articulate appropriately with the dens. Prevertebral soft tissues are unremarkable. IMPRESSION: No acute intracranial process. No acute cervical spine fracture. Electronically Signed   By: Lovey Newcomer M.D.   On: 10/31/2015 15:15   Ct Chest Wo Contrast  Result Date:  10/31/2015 CLINICAL DATA:  Pain after trauma. EXAM: CT CHEST, ABDOMEN AND PELVIS WITHOUT CONTRAST TECHNIQUE: Multidetector CT imaging of the chest, abdomen and pelvis was performed following the standard protocol without IV contrast. COMPARISON:  CT of the chest January 08, 2011 FINDINGS: CT CHEST The central airways are normal. No pneumothorax. No pulmonary not, masses, or focal infiltrates. The thoracic aorta is normal in caliber measuring 3.9 cm. No aneurysm. The central pulmonary arteries are normal in caliber as well. There are coronary artery calcifications. The heart size is normal. No effusions. No adenopathy. CT ABDOMEN AND PELVIS No free air or free fluid. Evaluation of parenchymal organs is limited without contrast. The liver, gallbladder, spleen, adrenal glands, and pancreas are unremarkable. There is fatty deposition within the pancreatic head. Bilateral renal stones are seen, most numerous in the left lower pole. No  hydronephrosis or perinephric stranding. No ureterectasis or ureteral stones. Atherosclerosis is seen in the abdominal aorta which is non aneurysmal. No adenopathy. A suture line is associated with the stomach consistent previous gastric surgery. The stomach and small bowel are otherwise normal. The colon demonstrates colonic diverticuli. Slight increased attenuation in the fat adjacent to the distal descending colon could represent very subtle diverticulitis. The remainder of the colon is normal. The appendix is unremarkable. The pelvis demonstrates a small cyst on the left on series 7, image 246 measuring 14 mm. This is of doubtful acute significance. No suspicious adenopathy or mass. The right bladder dome herniates into a right inguinal hernia. The remainder of the bladder is normal. No other abnormalities in the pelvis. Subcortical sclerosis in the femoral head suggests small regions of DVT and. No acute bony abnormalities. There is a fat containing umbilical hernia. Another fat containing hernia is seen anteriorly, above the umbilicus. IMPRESSION: 1. Nonobstructive bilateral renal stones. 2. Atherosclerosis in the abdominal aorta. 3. 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 Minimal increased attenuation in the fat adjacent to the distal descending colon could represent mild diverticulitis in the appropriate clinical setting. 4. A small 14 mm cyst in the left lateral pelvis is of doubtful significance. This could represent a lymphocele or duplication cyst. 5. The right bladder dome herniates into a right inguinal hernia. This is not of acute significance and likely chronic. 6. 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 Electronically Signed   By: Dorise Bullion III M.D   On: 10/31/2015 15:32   Ct Cervical Spine Wo Contrast  Result Date: 10/31/2015 CLINICAL DATA:  Patient status post fall from ladder, 60 high. No reported loss of consciousness. Alert and oriented. EXAM: CT HEAD WITHOUT CONTRAST CT CERVICAL SPINE WITHOUT  CONTRAST TECHNIQUE: Multidetector CT imaging of the head and cervical spine was performed following the standard protocol without intravenous contrast. Multiplanar CT image reconstructions of the cervical spine were also generated. COMPARISON:  None. FINDINGS: CT HEAD FINDINGS Ventricles and sulci are appropriate for patient's age. No evidence for acute cortically based infarct, intracranial hemorrhage, mass lesion or mass-effect. Orbits are unremarkable. Paranasal sinuses are well aerated. Mastoid air cells are unremarkable. Calvarium is intact. CT CERVICAL SPINE FINDINGS Normal anatomic alignment. Multilevel degenerative disc disease most pronounced C4-5, C5-6 C6-7. Multilevel facet degenerative changes. Craniocervical junction is intact. Lateral masses articulate appropriately with the dens. Prevertebral soft tissues are unremarkable. IMPRESSION: No acute intracranial process. No acute cervical spine fracture. Electronically Signed   By: Lovey Newcomer M.D.   On: 10/31/2015 15:15   Ct Foot Right Wo Contrast  Result Date: 10/31/2015 CLINICAL DATA:  Status post 6  foot fall from a ladder today with a Lisfranc injury of the right foot. Initial encounter. EXAM: CT OF THE RIGHT FOOT WITHOUT CONTRAST TECHNIQUE: Multidetector CT imaging of the right foot was performed according to the standard protocol. Multiplanar CT image reconstructions were also generated. COMPARISON:  Plain films right foot this same day. FINDINGS: There is a chip fracture off the medial corner of the base of the fourth metatarsal. Fracture fragment measures a maximum of approximately 0.5 cm. Also seen is a chip fracture off the plantar aspect of the base of the third metatarsal. Minimally comminuted fracture off the inferior and medial corner of the base of the second metatarsal is also identified. Additional chip fracture is seen off the dorsal margin of the distal lateral cuneiform. No other fracture is identified. The first, second and third  metatarsals ball are laterally subluxed approximately 0.5 cm of the tarsometatarsal joints. Soft tissue swelling and hematoma are seen about the foot. The Lisfranc ligament is markedly indistinct and torn. Ligaments are not well assessed on CT. Intrinsic musculature the foot appears atrophied. IMPRESSION: Findings consistent with a Lisfranc injury with chip fractures off the second, third and fourth metatarsals and lateral cuneiform as described above. The Lisfranc ligament is torn. Evaluation of ligaments on CT scan is limited. Electronically Signed   By: Inge Rise M.D.   On: 10/31/2015 16:28   Dg Chest Port 1 View  Result Date: 10/31/2015 CLINICAL DATA:  Pain after fall EXAM: PORTABLE CHEST 1 VIEW COMPARISON:  November 22, 2014 FINDINGS: The heart size and mediastinal contours are within normal limits. Both lungs are clear. The visualized skeletal structures are unremarkable. IMPRESSION: No active disease. Electronically Signed   By: Dorise Bullion III M.D   On: 10/31/2015 13:24   Dg Foot Complete Right  Result Date: 10/31/2015 CLINICAL DATA:  Pain after fall EXAM: RIGHT FOOT COMPLETE - 3+ VIEW COMPARISON:  None. FINDINGS: The bases of the first and second metatarsals do not align appropriately with the adjacent cuneiform bones. There also appear to be a few tiny bony fragments between the bases of the first and second metatarsals. Soft tissue swelling is identified. No other acute abnormalities. IMPRESSION: 1. Lis franc injuries at the bases of the first and second metatarsals with malalignment in addition to tiny bony fragments. Recommend a CT scan for better evaluation. Electronically Signed   By: Dorise Bullion III M.D   On: 10/31/2015 13:27       Subjective: Foot pain much better.  Discharge Exam: Vitals:   11/04/15 0026 11/04/15 0446  BP: 129/70 133/64  Pulse: 91 78  Resp: 18 18  Temp: 99.1 F (37.3 C) 98.5 F (36.9 C)   Vitals:   11/03/15 2022 11/03/15 2035 11/04/15 0026  11/04/15 0446  BP: 114/75 124/68 129/70 133/64  Pulse: 65 67 91 78  Resp: 17 18 18 18   Temp: 98.8 F (37.1 C) 98.2 F (36.8 C) 99.1 F (37.3 C) 98.5 F (36.9 C)  TempSrc:  Oral Oral Oral  SpO2: 97% 97% 96% 96%    General: Elderly obese male not in distress HEENT: Moist mucosa, supple neck Cardiovascular: Son and S2 irregular, no murmurs rub or gallop Respiratory: CTA bilaterally, no wheezing, no rhonchi Abdominal: Soft, NT, ND, bowel sounds + Extremities: Warm, wound VAC over right foot, no swelling CNS: Alert and oriented    The results of significant diagnostics from this hospitalization (including imaging, microbiology, ancillary and laboratory) are listed below for reference.  Microbiology: Recent Results (from the past 240 hour(s))  Surgical pcr screen     Status: None   Collection Time: 11/03/15  4:45 AM  Result Value Ref Range Status   MRSA, PCR NEGATIVE NEGATIVE Final   Staphylococcus aureus NEGATIVE NEGATIVE Final    Comment:        The Xpert SA Assay (FDA approved for NASAL specimens in patients over 83 years of age), is one component of a comprehensive surveillance program.  Test performance has been validated by [facility] for patients greater than or equal to 64 year old. It is not intended to diagnose infection nor to guide or monitor treatment.      Labs: BNP (last 3 results) No results for input(s): BNP in the last 8760 hours. Basic Metabolic Panel:  Recent Labs Lab 10/31/15 1330 10/31/15 1338 11/01/15 0313 11/02/15 0329 11/03/15 0531  NA 137 142 138 135 138  K 3.8 4.0 3.4* 3.8 3.9  CL 115* 111 112* 109 111  CO2 17*  --  19* 19* 22  GLUCOSE 94 89 111* 103* 124*  BUN 18 23* 17 14 14   CREATININE 1.76* 1.70* 1.46* 1.28* 1.30*  CALCIUM 9.0  --  8.6* 8.5* 8.6*   Liver Function Tests:  Recent Labs Lab 10/31/15 1330  AST 20  ALT 16*  ALKPHOS 75  BILITOT 0.6  PROT 6.1*  ALBUMIN 3.6   No results for input(s): LIPASE, AMYLASE  in the last 168 hours. No results for input(s): AMMONIA in the last 168 hours. CBC:  Recent Labs Lab 10/31/15 1330 10/31/15 1338 11/01/15 0313 11/02/15 0329 11/03/15 0531  WBC 8.9  --  8.4 8.5 7.4  HGB 13.1 14.3 12.1* 11.6* 11.8*  HCT 42.3 42.0 39.2 37.2* 37.9*  MCV 80.6  --  81.7 81.2 81.7  PLT 219  --  188 190 181   Cardiac Enzymes: No results for input(s): CKTOTAL, CKMB, CKMBINDEX, TROPONINI in the last 168 hours. BNP: Invalid input(s): POCBNP CBG:  Recent Labs Lab 11/03/15 1230 11/03/15 1559 11/03/15 1916 11/03/15 2230 11/04/15 0623  GLUCAP 104* 93 103* 101* 131*   D-Dimer No results for input(s): DDIMER in the last 72 hours. Hgb A1c No results for input(s): HGBA1C in the last 72 hours. Lipid Profile No results for input(s): CHOL, HDL, LDLCALC, TRIG, CHOLHDL, LDLDIRECT in the last 72 hours. Thyroid function studies No results for input(s): TSH, T4TOTAL, T3FREE, THYROIDAB in the last 72 hours.  Invalid input(s): FREET3 Anemia work up No results for input(s): VITAMINB12, FOLATE, FERRITIN, TIBC, IRON, RETICCTPCT in the last 72 hours. Urinalysis    Component Value Date/Time   COLORURINE YELLOW 10/31/2015 2133   APPEARANCEUR CLEAR 10/31/2015 2133   LABSPEC 1.027 10/31/2015 2133   PHURINE 5.5 10/31/2015 2133   GLUCOSEU >1000 (A) 10/31/2015 2133   HGBUR NEGATIVE 10/31/2015 2133   BILIRUBINUR NEGATIVE 10/31/2015 2133   KETONESUR 15 (A) 10/31/2015 2133   PROTEINUR NEGATIVE 10/31/2015 2133   UROBILINOGEN 1.0 11/22/2014 1337   NITRITE NEGATIVE 10/31/2015 2133   LEUKOCYTESUR NEGATIVE 10/31/2015 2133   Sepsis Labs Invalid input(s): PROCALCITONIN,  WBC,  LACTICIDVEN Microbiology Recent Results (from the past 240 hour(s))  Surgical pcr screen     Status: None   Collection Time: 11/03/15  4:45 AM  Result Value Ref Range Status   MRSA, PCR NEGATIVE NEGATIVE Final   Staphylococcus aureus NEGATIVE NEGATIVE Final    Comment:        The Xpert SA Assay  (FDA approved for NASAL  specimens in patients over 57 years of age), is one component of a comprehensive surveillance program.  Test performance has been validated by [facility] for patients greater than or equal to 15 year old. It is not intended to diagnose infection nor to guide or monitor treatment.      Time coordinating discharge: Over 30 minutes  SIGNED:   Louellen Molder, MD  [facility] 11/04/2015, 8:53 AM Pager   If 7PM-7AM, please contact night-coverage [email or web address] Password [email or web address]

## 2015-11-06 DIAGNOSIS — I110 Hypertensive heart disease with heart failure: Secondary | ICD-10-CM | POA: Diagnosis not present

## 2015-11-06 DIAGNOSIS — Z4801 Encounter for change or removal of surgical wound dressing: Secondary | ICD-10-CM | POA: Diagnosis not present

## 2015-11-06 DIAGNOSIS — E1122 Type 2 diabetes mellitus with diabetic chronic kidney disease: Secondary | ICD-10-CM | POA: Diagnosis not present

## 2015-11-06 DIAGNOSIS — S93324D Dislocation of tarsometatarsal joint of right foot, subsequent encounter: Secondary | ICD-10-CM | POA: Diagnosis not present

## 2015-11-06 DIAGNOSIS — N183 Chronic kidney disease, stage 3 (moderate): Secondary | ICD-10-CM | POA: Diagnosis not present

## 2015-11-06 DIAGNOSIS — I5040 Unspecified combined systolic (congestive) and diastolic (congestive) heart failure: Secondary | ICD-10-CM | POA: Diagnosis not present

## 2015-11-09 NOTE — Anesthesia Postprocedure Evaluation (Signed)
Anesthesia Post Note  Patient: [name] [name]  Procedure(s) Performed: Procedure(s) (LRB): ORIF RIGHT LISFRANK FOOT (Right)  Anesthesia Post Evaluation  Last Vitals:  Vitals:   11/04/15 0446 11/04/15 1400  BP: 133/64 (!) 108/55  Pulse: 78 61  Resp: 18 16  Temp: 36.9 C 37.1 C    Last Pain:  Vitals:   11/04/15 1200  TempSrc:   PainSc: 3                  [name] EDWARD

## 2015-11-11 DIAGNOSIS — I5040 Unspecified combined systolic (congestive) and diastolic (congestive) heart failure: Secondary | ICD-10-CM | POA: Diagnosis not present

## 2015-11-11 DIAGNOSIS — N183 Chronic kidney disease, stage 3 (moderate): Secondary | ICD-10-CM | POA: Diagnosis not present

## 2015-11-11 DIAGNOSIS — I110 Hypertensive heart disease with heart failure: Secondary | ICD-10-CM | POA: Diagnosis not present

## 2015-11-11 DIAGNOSIS — S93324D Dislocation of tarsometatarsal joint of right foot, subsequent encounter: Secondary | ICD-10-CM | POA: Diagnosis not present

## 2015-11-11 DIAGNOSIS — Z4801 Encounter for change or removal of surgical wound dressing: Secondary | ICD-10-CM | POA: Diagnosis not present

## 2015-11-11 DIAGNOSIS — E1122 Type 2 diabetes mellitus with diabetic chronic kidney disease: Secondary | ICD-10-CM | POA: Diagnosis not present

## 2015-11-18 DIAGNOSIS — S93324D Dislocation of tarsometatarsal joint of right foot, subsequent encounter: Secondary | ICD-10-CM | POA: Diagnosis not present

## 2015-11-18 DIAGNOSIS — I5040 Unspecified combined systolic (congestive) and diastolic (congestive) heart failure: Secondary | ICD-10-CM | POA: Diagnosis not present

## 2015-11-18 DIAGNOSIS — E1122 Type 2 diabetes mellitus with diabetic chronic kidney disease: Secondary | ICD-10-CM | POA: Diagnosis not present

## 2015-11-18 DIAGNOSIS — N183 Chronic kidney disease, stage 3 (moderate): Secondary | ICD-10-CM | POA: Diagnosis not present

## 2015-11-18 DIAGNOSIS — Z4801 Encounter for change or removal of surgical wound dressing: Secondary | ICD-10-CM | POA: Diagnosis not present

## 2015-11-18 DIAGNOSIS — I110 Hypertensive heart disease with heart failure: Secondary | ICD-10-CM | POA: Diagnosis not present

## 2015-11-25 DIAGNOSIS — S93321D Subluxation of tarsometatarsal joint of right foot, subsequent encounter: Secondary | ICD-10-CM | POA: Diagnosis not present

## 2015-11-26 DIAGNOSIS — I5040 Unspecified combined systolic (congestive) and diastolic (congestive) heart failure: Secondary | ICD-10-CM | POA: Diagnosis not present

## 2015-11-26 DIAGNOSIS — S93324D Dislocation of tarsometatarsal joint of right foot, subsequent encounter: Secondary | ICD-10-CM | POA: Diagnosis not present

## 2015-11-26 DIAGNOSIS — I110 Hypertensive heart disease with heart failure: Secondary | ICD-10-CM | POA: Diagnosis not present

## 2015-11-26 DIAGNOSIS — N183 Chronic kidney disease, stage 3 (moderate): Secondary | ICD-10-CM | POA: Diagnosis not present

## 2015-11-26 DIAGNOSIS — Z4801 Encounter for change or removal of surgical wound dressing: Secondary | ICD-10-CM | POA: Diagnosis not present

## 2015-11-26 DIAGNOSIS — E1122 Type 2 diabetes mellitus with diabetic chronic kidney disease: Secondary | ICD-10-CM | POA: Diagnosis not present

## 2015-11-30 DIAGNOSIS — E1122 Type 2 diabetes mellitus with diabetic chronic kidney disease: Secondary | ICD-10-CM | POA: Diagnosis not present

## 2015-11-30 DIAGNOSIS — I5040 Unspecified combined systolic (congestive) and diastolic (congestive) heart failure: Secondary | ICD-10-CM | POA: Diagnosis not present

## 2015-11-30 DIAGNOSIS — I110 Hypertensive heart disease with heart failure: Secondary | ICD-10-CM | POA: Diagnosis not present

## 2015-11-30 DIAGNOSIS — Z4801 Encounter for change or removal of surgical wound dressing: Secondary | ICD-10-CM | POA: Diagnosis not present

## 2015-11-30 DIAGNOSIS — N183 Chronic kidney disease, stage 3 (moderate): Secondary | ICD-10-CM | POA: Diagnosis not present

## 2015-11-30 DIAGNOSIS — S93324D Dislocation of tarsometatarsal joint of right foot, subsequent encounter: Secondary | ICD-10-CM | POA: Diagnosis not present

## 2015-12-02 DIAGNOSIS — S93324D Dislocation of tarsometatarsal joint of right foot, subsequent encounter: Secondary | ICD-10-CM | POA: Diagnosis not present

## 2015-12-02 DIAGNOSIS — Z4801 Encounter for change or removal of surgical wound dressing: Secondary | ICD-10-CM | POA: Diagnosis not present

## 2015-12-02 DIAGNOSIS — I110 Hypertensive heart disease with heart failure: Secondary | ICD-10-CM | POA: Diagnosis not present

## 2015-12-02 DIAGNOSIS — I5040 Unspecified combined systolic (congestive) and diastolic (congestive) heart failure: Secondary | ICD-10-CM | POA: Diagnosis not present

## 2015-12-02 DIAGNOSIS — E1122 Type 2 diabetes mellitus with diabetic chronic kidney disease: Secondary | ICD-10-CM | POA: Diagnosis not present

## 2015-12-02 DIAGNOSIS — N183 Chronic kidney disease, stage 3 (moderate): Secondary | ICD-10-CM | POA: Diagnosis not present

## 2015-12-07 NOTE — Addendum Note (Signed)
Addendum  created 12/07/15 1147 by Lyndle Herrlich, MD   Anesthesia Intra Blocks edited, Sign clinical note

## 2015-12-15 DIAGNOSIS — E1122 Type 2 diabetes mellitus with diabetic chronic kidney disease: Secondary | ICD-10-CM | POA: Diagnosis not present

## 2015-12-15 DIAGNOSIS — N183 Chronic kidney disease, stage 3 (moderate): Secondary | ICD-10-CM | POA: Diagnosis not present

## 2015-12-15 DIAGNOSIS — I5040 Unspecified combined systolic (congestive) and diastolic (congestive) heart failure: Secondary | ICD-10-CM | POA: Diagnosis not present

## 2015-12-15 DIAGNOSIS — I110 Hypertensive heart disease with heart failure: Secondary | ICD-10-CM | POA: Diagnosis not present

## 2015-12-15 DIAGNOSIS — S93324D Dislocation of tarsometatarsal joint of right foot, subsequent encounter: Secondary | ICD-10-CM | POA: Diagnosis not present

## 2015-12-15 DIAGNOSIS — Z4801 Encounter for change or removal of surgical wound dressing: Secondary | ICD-10-CM | POA: Diagnosis not present

## 2015-12-20 NOTE — Progress Notes (Signed)
Cardiology Office Note   Date:  12/21/2015   ID:  [name] [name], DOB [birth year], MRN [id number]  PCP:  Donnajean Lopes, MD    No chief complaint on file. AFib   Wt Readings from Last 3 Encounters:  12/21/15 230 lb (104.3 kg)  04/21/15 236 lb 3.2 oz (107.1 kg)  11/27/14 232 lb 3.2 oz (105.3 kg)       History of Present Illness: [name] [name] is a 65 y.o. male   with a h/o pesisitent afib s/p 2 ablations and in 11/16 underwent a convergent afib ablation with Dr. Lehman Prom and Dr. Danise Mina at [facility]. He had some complications with cardiogenic shock per pt and had to be intubated and spend 4 days in intensive care.  It was recommended that he stop his Multaq.   BP was an issue.  Of late, BP has been well controlled.  He is tolerating Coreg.  He wants to stop Xarelto.    Recent broken foot and surgery after falling off a ladder.   No sx of palpitations.  He was very symptomatic in the past when he had AFib.    Past Medical History:  Diagnosis Date  . Anemia   . Arthritis    OA  . Atrial flutter ([place])    a. s/p RFCA 8/13  . Cancer (HCC)    HISTORY OF PROSTATE CANCER  . Chronic combined systolic and diastolic CHF (congestive heart failure) ([place])    a. LVEF previously 35% felt to be due tachycardia;  b. 02/2013 Echo: EF 50-55%, no rwma, mildly dil LA/RA, mild to mod MR. C 11/2014 echo - ef 60-65%, unable to determine DD  . CKD (chronic kidney disease), stage III    hx of a/c renal failure during episode of diverticulitis 9/13 in [place], [place]  . Diabetes mellitus ([place])    TYPE 2   . Diverticulosis   . Erectile dysfunction   . Gastric ulcer    s/p prior surgery  . Headache   . Hypertension   . Hypothyroid   . Obesities, morbid ([place])   . Obstructive sleep apnea    noncompliant with CPAP  . PAF (paroxysmal atrial fibrillation) ([place])    a. failed DCCV and multiple anti-arrhythmic drugs (multaq/flecainide);   b. s/p  PVI isolation with ablation of AFib 8/13; c.  repeat PVI 01/2013;  d. 02/2013 repeat DCCV->Amio load/xarelto.  . Renal calculi     Past Surgical History:  Procedure Laterality Date  . ATRIAL FIBRILLATION ABLATION  12/06/11; 02/05/2013   Afib and atrial flutter ablation by Dr Rayann Heman; repeat PVI by Dr Rayann Heman 02/05/2013  . ATRIAL FIBRILLATION ABLATION N/A 12/06/2011   Procedure: ATRIAL FIBRILLATION ABLATION;  Surgeon: Thompson Grayer, MD;  Location: [facility] CATH LAB;  Service: Cardiovascular;  Laterality: N/A;  . ATRIAL FIBRILLATION ABLATION N/A 02/05/2013   Procedure: ATRIAL FIBRILLATION ABLATION;  Surgeon: Coralyn Mark, MD;  Location: [place] CATH LAB;  Service: Cardiovascular;  Laterality: N/A;  . benign stomach tumor removal  2000  . CARDIOVERSION  08/25/2011   Procedure: CARDIOVERSION;  Surgeon: Jettie Booze, MD;  Location: [place];  Service: Cardiovascular;  Laterality: N/A;  . CARDIOVERSION N/A 03/05/2013   Procedure: CARDIOVERSION;  Surgeon: Sinclair Grooms, MD;  Location: [place];  Service: Cardiovascular;  Laterality: N/A;  BEDSIDE   . convergent afib abaltion [facility] 02/26/15  02/26/15   [facility] by Dr. Lehman Prom and Dr. Danise Mina  . ELECTROPHYSIOLOGIC STUDY N/A 11/24/2014   Procedure: Cardioversion;  Surgeon: Will Meredith Leeds, MD;  Location: [place] CV LAB;  Service: Cardiovascular;  Laterality: N/A;  . ESOPHAGOGASTRODUODENOSCOPY N/A 04/09/2013   Procedure: ESOPHAGOGASTRODUODENOSCOPY (EGD) with possible Balloon Dilation;  Surgeon: Garlan Fair, MD;  Location: [facility] ENDOSCOPY;  Service: Endoscopy;  Laterality: N/A;  . kidney stone removal     multiple  . knee sx  1985   both  . LAPAROSCOPIC RETROPUBIC PROSTATECTOMY  02/2007   hx prostate cancer  . ORIF ANKLE FRACTURE Right 10/2015  . ORIF ANKLE FRACTURE Right 11/03/2015   Procedure: ORIF RIGHT LISFRANK FOOT;  Surgeon: Newt Minion, MD;  Location: [place];  Service: Orthopedics;  Laterality: Right;  . TEE WITHOUT CARDIOVERSION  08/25/2011   Procedure: TRANSESOPHAGEAL  ECHOCARDIOGRAM (TEE);  Surgeon: Jettie Booze, MD;  Location: [place];  Service: Cardiovascular;  Laterality: N/A;  . TEE WITHOUT CARDIOVERSION  11/09/2011   Procedure: TRANSESOPHAGEAL ECHOCARDIOGRAM (TEE);  Surgeon: Jettie Booze, MD;  Location: [facility] ENDOSCOPY;  Service: Cardiovascular;  Laterality: N/A;  h/p in file drawer  . TEE WITHOUT CARDIOVERSION N/A 02/05/2013   Procedure: TRANSESOPHAGEAL ECHOCARDIOGRAM (TEE);  Surgeon: Candee Furbish, MD;  Location: [facility] ENDOSCOPY;  Service: Cardiovascular;  Laterality: N/A;  . THYROIDECTOMY  2009   cysts removal   . wrist sx  1980   right     Current Outpatient Prescriptions  Medication Sig Dispense Refill  . Canagliflozin (INVOKANA) 300 MG TABS Take 1 tablet by mouth daily.    . carvedilol (COREG) 12.5 MG tablet Take 1.5 tablets (18.75 mg total) by mouth 2 (two) times daily. (Patient taking differently: Take 12.5 mg by mouth 2 (two) times daily. ) 270 tablet 3  . Cholecalciferol (VITAMIN D) 2000 UNITS CAPS Take 2,000 Units by mouth daily.    Marland Kitchen diltiazem (CARDIZEM) 30 MG tablet Take 1 tablet (30 mg total) by mouth as needed (Every 4 hours AS NEEDED with a HR 123XX123 and Systolic 123XX123 (BP)). 30 tablet 3  . DULoxetine (CYMBALTA) 30 MG capsule Take 1 capsule by mouth every morning.    . ferrous sulfate 325 (65 FE) MG tablet Take 1 tablet (325 mg total) by mouth daily with breakfast. 90 tablet 1  . folic acid (FOLVITE) 1 MG tablet Take 1 mg by mouth daily.    Marland Kitchen GLUCOSAMINE PO Take 1,000 mg by mouth 2 (two) times daily.     Marland Kitchen HYDROcodone-acetaminophen (NORCO/VICODIN) 5-325 MG tablet Take 2 tablets by mouth every 6 (six) hours as needed for moderate pain. 30 tablet 0  . imipramine (TOFRANIL) 25 MG tablet Take 50 mg by mouth at bedtime.     Marland Kitchen levothyroxine (SYNTHROID, LEVOTHROID) 150 MCG tablet Take 150 mcg by mouth daily before breakfast.    . Multiple Vitamins-Minerals (MENS 50+ MULTI VITAMIN/MIN PO) Take 1 tablet by mouth daily.    .  pantoprazole (PROTONIX) 40 MG tablet Take 40 mg by mouth daily.     . polycarbophil (FIBERCON) 625 MG tablet Take 1,250 mg by mouth daily.    . polyethylene glycol (MIRALAX / GLYCOLAX) packet Take 17 g by mouth daily as needed for mild constipation. 10 each 0  . potassium chloride SA (K-DUR,KLOR-CON) 20 MEQ tablet Take 2 tablets (40 mEq total) by mouth daily. (Patient taking differently: Take 40 mEq by mouth 2 (two) times daily. ) 180 tablet 1  . pravastatin (PRAVACHOL) 20 MG tablet Take 10 mg by mouth at bedtime.     . Rivaroxaban (XARELTO) 15 MG TABS tablet Take 1 tablet (  15 mg total) by mouth every evening. 90 tablet 3  . albuterol (PROVENTIL) (2.5 MG/3ML) 0.083% nebulizer solution Take 2.5 mg by nebulization 2 (two) times daily as needed for wheezing or shortness of breath.      No current facility-administered medications for this visit.     Allergies:   Phenergan [promethazine hcl]; Aspirin; Bystolic [nebivolol hcl]; Calcium channel blockers; Prednisone; and Nsaids    Social History:  The patient  reports that he has never smoked. He has never used smokeless tobacco. He reports that he does not drink alcohol or use drugs.   Family History:  The patient's *family history includes Diabetes in his brother; Emphysema in his mother; Lung cancer in his father.    ROS:  Please see the history of present illness.   Otherwise, review of systems are positive for recent foot fracture.   All other systems are reviewed and negative.    PHYSICAL EXAM: VS:  BP 116/80 (BP Location: Left Arm, Patient Position: Sitting, Cuff Size: Normal)   Pulse 64   Ht 5\' 7"  (1.702 m)   Wt 230 lb (104.3 kg)   BMI 36.02 kg/m  , BMI Body mass index is 36.02 kg/m. GEN: Well nourished, well developed, in no acute distress  HEENT: normal  Neck: no JVD, carotid bruits, or masses Cardiac: RRR; no murmurs, rubs, or gallops,no edema  Respiratory:  clear to auscultation bilaterally, normal work of breathing GI: soft,  nontender, nondistended, + BS MS: no deformity or atrophy  Skin: warm and dry, no rash Neuro:  Strength and sensation are intact Psych: euthymic mood, full affect    Recent Labs: 10/31/2015: ALT 16; TSH 1.402 11/03/2015: BUN 14; Creatinine, Ser 1.30; Hemoglobin 11.8; Platelets 181; Potassium 3.9; Sodium 138   Lipid Panel    Component Value Date/Time   CHOL 181 07/24/2013 0530   TRIG 99 07/24/2013 0530   HDL 52 07/24/2013 0530   CHOLHDL 3.5 07/24/2013 0530   VLDL 20 07/24/2013 0530   LDLCALC 109 (H) 07/24/2013 0530     Other studies Reviewed: Additional studies/ records that were reviewed today with results demonstrating: [facility]. .   ASSESSMENT AND PLAN:  1. Persistent AFib: Now back in NSR.  He wants to come off of Xarelto.  Will check with AFib clinic.   2. HTN: Well controlled.  3. Obtain lab results from [place] office.  4. Hyperlipidemia: COntinue pravastatin.    Current medicines are reviewed at length with the patient today.  The patient concerns regarding his medicines were addressed.  The following changes have been made:  No change  Labs/ tests ordered today include:  No orders of the defined types were placed in this encounter.   Recommend 150 minutes/week of aerobic exercise Low fat, low carb, high fiber diet recommended  Disposition:   FU in 1 year   Signed, Larae Grooms, MD  12/21/2015 10:31 AM    [facility] [place], [place], [place]  [address] Phone: [phone]; Fax: [phone]

## 2015-12-21 ENCOUNTER — Encounter: Payer: Self-pay | Admitting: Interventional Cardiology

## 2015-12-21 ENCOUNTER — Ambulatory Visit (INDEPENDENT_AMBULATORY_CARE_PROVIDER_SITE_OTHER): Payer: Medicare Other | Admitting: Interventional Cardiology

## 2015-12-21 VITALS — BP 116/80 | HR 64 | Ht 67.0 in | Wt 230.0 lb

## 2015-12-21 DIAGNOSIS — E785 Hyperlipidemia, unspecified: Secondary | ICD-10-CM | POA: Insufficient documentation

## 2015-12-21 DIAGNOSIS — I480 Paroxysmal atrial fibrillation: Secondary | ICD-10-CM | POA: Diagnosis not present

## 2015-12-21 DIAGNOSIS — E1169 Type 2 diabetes mellitus with other specified complication: Secondary | ICD-10-CM | POA: Insufficient documentation

## 2015-12-21 DIAGNOSIS — I10 Essential (primary) hypertension: Secondary | ICD-10-CM | POA: Diagnosis not present

## 2015-12-21 DIAGNOSIS — Z8679 Personal history of other diseases of the circulatory system: Secondary | ICD-10-CM

## 2015-12-21 DIAGNOSIS — Z9889 Other specified postprocedural states: Secondary | ICD-10-CM

## 2015-12-21 DIAGNOSIS — Z7901 Long term (current) use of anticoagulants: Secondary | ICD-10-CM | POA: Diagnosis not present

## 2015-12-21 NOTE — Patient Instructions (Signed)
Your physician recommends that you continue on your current medications as directed. Please refer to the Current Medication list given to you today.  Your physician wants you to follow-up in: 1 year with Dr. Varanasi. You will receive a reminder letter in the mail two months in advance. If you don't receive a letter, please call our office to schedule the follow-up appointment.  

## 2015-12-22 DIAGNOSIS — E1122 Type 2 diabetes mellitus with diabetic chronic kidney disease: Secondary | ICD-10-CM | POA: Diagnosis not present

## 2015-12-22 DIAGNOSIS — N183 Chronic kidney disease, stage 3 (moderate): Secondary | ICD-10-CM | POA: Diagnosis not present

## 2015-12-22 DIAGNOSIS — I5040 Unspecified combined systolic (congestive) and diastolic (congestive) heart failure: Secondary | ICD-10-CM | POA: Diagnosis not present

## 2015-12-22 DIAGNOSIS — Z4801 Encounter for change or removal of surgical wound dressing: Secondary | ICD-10-CM | POA: Diagnosis not present

## 2015-12-22 DIAGNOSIS — I110 Hypertensive heart disease with heart failure: Secondary | ICD-10-CM | POA: Diagnosis not present

## 2015-12-22 DIAGNOSIS — S93324D Dislocation of tarsometatarsal joint of right foot, subsequent encounter: Secondary | ICD-10-CM | POA: Diagnosis not present

## 2015-12-23 NOTE — Progress Notes (Signed)
Dr. Rayann Heman recommends that we continue Xarelto.  There is an implantable monitor that we would check to ensure no AFib before stopping anticoagulation, if he really wants to stop it.

## 2015-12-28 NOTE — Progress Notes (Signed)
The pt states that he will call us back if he decides that he wants to stop taking Xarelto as he is unsure at this time.

## 2016-01-08 NOTE — Addendum Note (Signed)
Addendum  created 01/08/16 1416 by Lyndle Herrlich, MD   Anesthesia Intra Blocks edited, Sign clinical note

## 2016-01-14 DIAGNOSIS — Z23 Encounter for immunization: Secondary | ICD-10-CM | POA: Diagnosis not present

## 2016-01-29 DIAGNOSIS — E890 Postprocedural hypothyroidism: Secondary | ICD-10-CM | POA: Diagnosis not present

## 2016-01-29 DIAGNOSIS — D509 Iron deficiency anemia, unspecified: Secondary | ICD-10-CM | POA: Diagnosis not present

## 2016-01-29 DIAGNOSIS — D508 Other iron deficiency anemias: Secondary | ICD-10-CM | POA: Diagnosis not present

## 2016-03-17 ENCOUNTER — Ambulatory Visit (INDEPENDENT_AMBULATORY_CARE_PROVIDER_SITE_OTHER): Payer: Medicare Other

## 2016-03-17 ENCOUNTER — Ambulatory Visit (INDEPENDENT_AMBULATORY_CARE_PROVIDER_SITE_OTHER): Payer: Medicare Other | Admitting: Orthopedic Surgery

## 2016-03-17 ENCOUNTER — Encounter (INDEPENDENT_AMBULATORY_CARE_PROVIDER_SITE_OTHER): Payer: Self-pay | Admitting: Orthopedic Surgery

## 2016-03-17 DIAGNOSIS — E1142 Type 2 diabetes mellitus with diabetic polyneuropathy: Secondary | ICD-10-CM | POA: Diagnosis not present

## 2016-03-17 DIAGNOSIS — M79671 Pain in right foot: Secondary | ICD-10-CM | POA: Diagnosis not present

## 2016-03-17 DIAGNOSIS — M170 Bilateral primary osteoarthritis of knee: Secondary | ICD-10-CM | POA: Diagnosis not present

## 2016-03-17 DIAGNOSIS — S92901G Unspecified fracture of right foot, subsequent encounter for fracture with delayed healing: Secondary | ICD-10-CM | POA: Diagnosis not present

## 2016-03-17 DIAGNOSIS — M6701 Short Achilles tendon (acquired), right ankle: Secondary | ICD-10-CM

## 2016-03-17 MED ORDER — METHYLPREDNISOLONE ACETATE 40 MG/ML IJ SUSP
40.0000 mg | INTRAMUSCULAR | Status: AC | PRN
  Administered 2016-03-17: 40 mg via INTRA_ARTICULAR

## 2016-03-17 MED ORDER — LIDOCAINE HCL 1 % IJ SOLN
5.0000 mL | INTRAMUSCULAR | Status: AC | PRN
  Administered 2016-03-17: 5 mL

## 2016-03-17 NOTE — Progress Notes (Signed)
Office Visit Note   Patient: [name] [name]           Date of Birth: [birth year]           MRN: [id number] Visit Date: 03/17/2016              Requested by: [name] Battles, MD [address] [place], [place] [address] PCP: [name] Lopes, MD   Assessment & Plan: Visit Diagnoses:  1. Pain in right foot   2. Closed fracture of right foot with delayed healing, subsequent encounter   3. Diabetic polyneuropathy associated with type 2 diabetes mellitus ([place])   4. Achilles tendon contracture, right   5. Bilateral primary osteoarthritis of knee     Plan: Both knees were injected with steroid without complications. Patient is on Xarelto and cannot take a nonsteroidal. Patient's Xarelto is for A. fib but he states that this has been treated. Patient is given instructions for heel cord stretching for his Achilles tendinitis. Recommended new balance sneakers and orthotics to provide better arch support heel cord stretching 5 times a day.  Follow-up in 4 weeks.  Obtain two-view radiographs of both knees at follow-up.  Follow-Up Instructions: Return in about 4 weeks (around 04/14/2016).   Orders:  Orders Placed This Encounter  Procedures  . XR Foot Complete Right   No orders of the defined types were placed in this encounter.     Procedures: Large Joint Inj Date/Time: 03/17/2016 11:31 AM Performed by: DUDA, MARCUS V Authorized by: Newt Minion   Consent Given by:  Patient Site marked: the procedure site was marked   Timeout: prior to procedure the correct patient, procedure, and site was verified   Indications:  Pain and diagnostic evaluation Location:  Knee Site:  R knee Prep: patient was prepped and draped in usual sterile fashion   Needle Size:  22 G Needle Length:  1.5 inches Approach:  Anteromedial Ultrasound Guidance: No   Fluoroscopic Guidance: No   Arthrogram: No   Medications:  5 mL lidocaine 1 %; 40 mg methylPREDNISolone acetate 40 MG/ML Aspiration Attempted:  No   Patient tolerance:  Patient tolerated the procedure well with no immediate complications Large Joint Inj Date/Time: 03/17/2016 11:32 AM Performed by: DUDA, MARCUS V Authorized by: Newt Minion   Consent Given by:  Patient Site marked: the procedure site was marked   Timeout: prior to procedure the correct patient, procedure, and site was verified   Indications:  Pain and diagnostic evaluation Location:  Knee Site:  L knee Prep: patient was prepped and draped in usual sterile fashion   Needle Size:  22 G Needle Length:  1.5 inches Approach:  Anteromedial Ultrasound Guidance: No   Fluoroscopic Guidance: No   Arthrogram: No   Medications:  5 mL lidocaine 1 %; 40 mg methylPREDNISolone acetate 40 MG/ML Aspiration Attempted: No   Patient tolerance:  Patient tolerated the procedure well with no immediate complications     Clinical Data: No additional findings.   Subjective: Chief Complaint  Patient presents with  . Right Foot - Routine Post Op    status post open reduction internal fixation, Lisfranc fracture dislocation 11/03/15    Patient is status post open reduction internal fixation, Lisfranc fracture dislocation 11/03/15. Patient has pain whenever he walks. Feels almost like a blister, questions if this is normal. Pain is from inside of foot. Still swelling any type of walking. He is taking tylenol for his pain  Patient states his pain is beneath the  second and third metatarsal head.  Review of Systems   Objective: Vital Signs: There were no vitals taken for this visit.  Physical Exam on examination patient is alert oriented no known well-dressed normal affect normal respiratory effort he does have an antalgic gait. Examination of both knees he does have a moderate effusion. He has crepitation range of motion both knees" inches stable he's tender to palpation medial joint line as well as patellofemoral joint. Examination of right foot he has good pulses he has  dorsiflexion only to neutral he has no pain or symptoms across the Lisfranc complex but is painful primarily beneath the second and third metatarsal head. Palpation beneath the second and third metatarsal head reproduces his symptoms. Radiographs shows a long second and third metatarsal.  Ortho Exam  Specialty Comments:  No specialty comments available.  Imaging: Xr Foot Complete Right  Result Date: 03/17/2016 Three-view radiographs the right foot shows stable internal fixation of the Lisfranc fracture dislocation. Radiographs do show a long second and third metatarsal.    PMFS History: Patient Active Problem List   Diagnosis Date Noted  . Diabetic polyneuropathy associated with type 2 diabetes mellitus ([place]) 03/17/2016  . Hyperlipidemia 12/21/2015  . Chronic combined systolic and diastolic CHF (congestive heart failure) ([place])   . Foot fracture, right 10/31/2015  . S/P ablation of atrial flutter-2013 and Oct 2014 11/24/2014  . Dyslipidemia 11/24/2014  . Chronic anticoagulation-Xarelto 11/24/2014  . Type 2 diabetes mellitus with stage 3 chronic kidney disease ([place]) 11/24/2014  . Hypothyroidism 11/24/2014  . Atrial fibrillation with RVR ([place]) 11/24/2014  . AKI (acute kidney injury) ([place])   . Syncope 11/22/2014  . PAF- DCCV Nov 2014- Amiodarone 11/22/2014  . Orthostatic hypotension 11/22/2014  . Hyperkalemia 11/22/2014  . Polycythemia 11/22/2014  . Atrial flutter with rapid ventricular response ([place])   . Acute bronchitis 08/08/2013  . TIA (transient ischemic attack) 07/23/2013  . Headache 07/23/2013  . Chronic combined systolic and diastolic heart failure ([place]) 03/13/2013  . Acute on chronic combined systolic and diastolic congestive heart failure, NYHA class 1 ([place]) 03/06/2013  . CKD (chronic kidney disease), stage III 03/06/2013  . Chronic diastolic heart failure ([place])   . CHF exacerbation ([place]) 03/04/2013  . Flash pulmonary edema ([place]) 03/04/2013  . Atrial fibrillation  ([place]) 10/24/2011  . Obstructive sleep apnea-C-pap intol 10/24/2011  . Hypertension 10/24/2011  . Obesity 10/24/2011   Past Medical History:  Diagnosis Date  . Anemia   . Arthritis    OA  . Atrial flutter ([place])    a. s/p RFCA 8/13  . Cancer (HCC)    HISTORY OF PROSTATE CANCER  . Chronic combined systolic and diastolic CHF (congestive heart failure) ([place])    a. LVEF previously 35% felt to be due tachycardia;  b. 02/2013 Echo: EF 50-55%, no rwma, mildly dil LA/RA, mild to mod MR. C 11/2014 echo - ef 60-65%, unable to determine DD  . CKD (chronic kidney disease), stage III    hx of a/c renal failure during episode of diverticulitis 9/13 in [place], [place]  . Diabetes mellitus ([place])    TYPE 2   . Diverticulosis   . Erectile dysfunction   . Gastric ulcer    s/p prior surgery  . Headache   . Hypertension   . Hypothyroid   . Obesities, morbid ([place])   . Obstructive sleep apnea    noncompliant with CPAP  . PAF (paroxysmal atrial fibrillation) ([place])    a. failed DCCV and multiple anti-arrhythmic  drugs (multaq/flecainide);   b. s/p  PVI isolation with ablation of AFib 8/13; c. repeat PVI 01/2013;  d. 02/2013 repeat DCCV->Amio load/xarelto.  . Renal calculi     Family History  Problem Relation Age of Onset  . Emphysema Mother   . Lung cancer Father   . Diabetes Brother   . Colon cancer Neg Hx     Past Surgical History:  Procedure Laterality Date  . ATRIAL FIBRILLATION ABLATION  12/06/11; 02/05/2013   Afib and atrial flutter ablation by Dr Rayann Heman; repeat PVI by Dr Rayann Heman 02/05/2013  . ATRIAL FIBRILLATION ABLATION N/A 12/06/2011   Procedure: ATRIAL FIBRILLATION ABLATION;  Surgeon: Thompson Grayer, MD;  Location[facility] CATH LAB;  Service: Cardiovascular;  Laterality: N/A;  . ATRIAL FIBRILLATION ABLATION N/A 02/05/2013   Procedure: ATRIAL FIBRILLATION ABLATION;  Surgeon: Coralyn Mark, MD;  Location: [place] CATH LAB;  Service: Cardiovascular;  Laterality: N/A;  . benign stomach tumor removal  2000    . CARDIOVERSION  08/25/2011   Procedure: CARDIOVERSION;  Surgeon: Jettie Booze, MD;  Location: [place];  Service: Cardiovascular;  Laterality: N/A;  . CARDIOVERSION N/A 03/05/2013   Procedure: CARDIOVERSION;  Surgeon: Sinclair Grooms, MD;  Location: [place];  Service: Cardiovascular;  Laterality: N/A;  BEDSIDE   . convergent afib abaltion [facility] 02/26/15  02/26/15   [facility] by Dr. Lehman Prom and Dr. Danise Mina  . ELECTROPHYSIOLOGIC STUDY N/A 11/24/2014   Procedure: Cardioversion;  Surgeon: Will Meredith Leeds, MD;  Location: [place] CV LAB;  Service: Cardiovascular;  Laterality: N/A;  . ESOPHAGOGASTRODUODENOSCOPY N/A 04/09/2013   Procedure: ESOPHAGOGASTRODUODENOSCOPY (EGD) with possible Balloon Dilation;  Surgeon: Garlan Fair, MD;  [facility] ENDOSCOPY;  Service: Endoscopy;  Laterality: N/A;  . kidney stone removal     multiple  . knee sx  1985   both  . LAPAROSCOPIC RETROPUBIC PROSTATECTOMY  02/2007   hx prostate cancer  . ORIF ANKLE FRACTURE Right 10/2015  . ORIF ANKLE FRACTURE Right 11/03/2015   Procedure: ORIF RIGHT LISFRANK FOOT;  Surgeon: Newt Minion, MD;  Location: [place];  Service: Orthopedics;  Laterality: Right;  . TEE WITHOUT CARDIOVERSION  08/25/2011   Procedure: TRANSESOPHAGEAL ECHOCARDIOGRAM (TEE);  Surgeon: Jettie Booze, MD;  Location: [place];  Service: Cardiovascular;  Laterality: N/A;  . TEE WITHOUT CARDIOVERSION  11/09/2011   Procedure: TRANSESOPHAGEAL ECHOCARDIOGRAM (TEE);  Surgeon: Jettie Booze, MD;  Location: [facility] ENDOSCOPY;  Service: Cardiovascular;  Laterality: N/A;  h/p in file drawer  . TEE WITHOUT CARDIOVERSION N/A 02/05/2013   Procedure: TRANSESOPHAGEAL ECHOCARDIOGRAM (TEE);  Surgeon: Candee Furbish, MD;  Location: [facility] ENDOSCOPY;  Service: Cardiovascular;  Laterality: N/A;  . THYROIDECTOMY  2009   cysts removal   . wrist sx  1980   right   Social History   Occupational History  . Not on file.   Social History Main Topics  .  Smoking status: Never Smoker  . Smokeless tobacco: Never Used  . Alcohol use No  . Drug use: No  . Sexual activity: Yes

## 2016-04-14 ENCOUNTER — Ambulatory Visit (INDEPENDENT_AMBULATORY_CARE_PROVIDER_SITE_OTHER): Payer: Medicare Other | Admitting: Orthopedic Surgery

## 2016-05-05 ENCOUNTER — Ambulatory Visit (INDEPENDENT_AMBULATORY_CARE_PROVIDER_SITE_OTHER): Payer: Medicare Other | Admitting: Family

## 2016-05-05 DIAGNOSIS — T3390XA Superficial frostbite of unspecified sites, initial encounter: Secondary | ICD-10-CM | POA: Diagnosis not present

## 2016-05-05 NOTE — Progress Notes (Signed)
Office Visit Note   Patient: [name] [name]           Date of Birth: 11-20-1950           MRN: [id number] Visit Date: 05/05/2016              Requested by: Leanna Battles, MD [place], [place] [id number] PCP: Donnajean Lopes, MD  No chief complaint on file.   HPI: Patient states that he wore boots out in the snow last week and they "must have been too small" he states the next day or two afterwards he woke up and his left foot all of the toes were red and blistered and swollen. They are not weeping the skin is in tact but swollen and red. He does note that it is painful but not so uncomfortable that he needs to take medication. He states that he does not check his blood sugars and has been on the medication Invokana for several years. Pamella Pert, RMA   The patient is a 66 year old gentleman who is seen today for evaluation of discoloration to his toes on the left foot. He was out shoveling snow about a week ago in snow boots for him a couple hours. the following day noticed discoloration to his toes. Does have diabetic peripheral neuropathy.    Assessment & Plan:   Visit Diagnoses:  1. Frostbite, initial encounter     Plan: continue with daily foot checks. Discussed discussed wound care. If any ulceration drainage or worsening they will return to the office. May use Silvadene. Is currently taking Xarelto for anticoagulation.  Follow-Up Instructions: Return in about 1 week (around 05/12/2016).   Ortho Exam Physical Exam  Constitutional: Appears well-developed.  Head: Normocephalic.  Eyes: EOM are normal.  Neck: Normal range of motion.  Cardiovascular: Normal rate.   Pulmonary/Chest: Effort normal.  Neurological: Is alert.  Skin: Skin is warm.  Psychiatric: Has a normal mood and affect. Left foot: Him has a strong dorsalis pedis pulse. The first second, third and fourth toes are discolored. Erythema from the DIP to tip of the him second third and fourth  toes. There is some ecchymosis great toe. No eschar. Him there are some superficial blisters scattered over 4 toes. No open ulcers noted drainage no warmth. Toes are cool to touch. No sign of infection today.  Imaging: No results found.  Orders:  No orders of the defined types were placed in this encounter.  No orders of the defined types were placed in this encounter.    Procedures: No procedures performed  Clinical Data: No additional findings.  Subjective: Review of Systems  Constitutional: Negative for chills and fever.  Cardiovascular: Negative for leg swelling.  Skin: Positive for color change. Negative for rash and wound.    Objective: Vital Signs: There were no vitals taken for this visit.  Specialty Comments:  No specialty comments available.  PMFS History: Patient Active Problem List   Diagnosis Date Noted  . Diabetic polyneuropathy associated with type 2 diabetes mellitus ([place]) 03/17/2016  . Hyperlipidemia 12/21/2015  . Chronic combined systolic and diastolic CHF (congestive heart failure) ([place])   . Foot fracture, right 10/31/2015  . S/P ablation of atrial flutter-2013 and Oct 2014 11/24/2014  . Dyslipidemia 11/24/2014  . Chronic anticoagulation-Xarelto 11/24/2014  . Type 2 diabetes mellitus with stage 3 chronic kidney disease ([place]) 11/24/2014  . Hypothyroidism 11/24/2014  . Atrial fibrillation with RVR ([place]) 11/24/2014  . AKI (acute kidney injury) ([place])   .  Syncope 11/22/2014  . PAF- DCCV Nov 2014- Amiodarone 11/22/2014  . Orthostatic hypotension 11/22/2014  . Hyperkalemia 11/22/2014  . Polycythemia 11/22/2014  . Atrial flutter with rapid ventricular response ([place])   . Acute bronchitis 08/08/2013  . TIA (transient ischemic attack) 07/23/2013  . Headache 07/23/2013  . Chronic combined systolic and diastolic heart failure ([place]) 03/13/2013  . Acute on chronic combined systolic and diastolic congestive heart failure, NYHA class 1 ([place]) 03/06/2013  . CKD  (chronic kidney disease), stage III 03/06/2013  . Chronic diastolic heart failure ([place])   . CHF exacerbation ([place]) 03/04/2013  . Flash pulmonary edema ([place]) 03/04/2013  . Atrial fibrillation ([place]) 10/24/2011  . Obstructive sleep apnea-C-pap intol 10/24/2011  . Hypertension 10/24/2011  . Obesity 10/24/2011   Past Medical History:  Diagnosis Date  . Anemia   . Arthritis    OA  . Atrial flutter ([place])    a. s/p RFCA 8/13  . Cancer (HCC)    HISTORY OF PROSTATE CANCER  . Chronic combined systolic and diastolic CHF (congestive heart failure) ([place])    a. LVEF previously 35% felt to be due tachycardia;  b. 02/2013 Echo: EF 50-55%, no rwma, mildly dil LA/RA, mild to mod MR. C 11/2014 echo - ef 60-65%, unable to determine DD  . CKD (chronic kidney disease), stage III    hx of a/c renal failure during episode of diverticulitis 9/13 in [place], [place]  . Diabetes mellitus ([place])    TYPE 2   . Diverticulosis   . Erectile dysfunction   . Gastric ulcer    s/p prior surgery  . Headache   . Hypertension   . Hypothyroid   . Obesities, morbid ([place])   . Obstructive sleep apnea    noncompliant with CPAP  . PAF (paroxysmal atrial fibrillation) ([place])    a. failed DCCV and multiple anti-arrhythmic drugs (multaq/flecainide);   b. s/p  PVI isolation with ablation of AFib 8/13; c. repeat PVI 01/2013;  d. 02/2013 repeat DCCV->Amio load/xarelto.  . Renal calculi     Family History  Problem Relation Age of Onset  . Emphysema Mother   . Lung cancer Father   . Diabetes Brother   . Colon cancer Neg Hx     Past Surgical History:  Procedure Laterality Date  . ATRIAL FIBRILLATION ABLATION  12/06/11; 02/05/2013   Afib and atrial flutter ablation by Dr Rayann Heman; repeat PVI by Dr Rayann Heman 02/05/2013  . ATRIAL FIBRILLATION ABLATION N/A 12/06/2011   Procedure: ATRIAL FIBRILLATION ABLATION;  Surgeon: Thompson Grayer, MD;  Location: [facility] CATH LAB;  Service: Cardiovascular;  Laterality: N/A;  . ATRIAL FIBRILLATION ABLATION  N/A 02/05/2013   Procedure: ATRIAL FIBRILLATION ABLATION;  Surgeon: Coralyn Mark, MD;  Location: [facility] CATH LAB;  Service: Cardiovascular;  Laterality: N/A;  . benign stomach tumor removal  2000  . CARDIOVERSION  08/25/2011   Procedure: CARDIOVERSION;  Surgeon: Jettie Booze, MD;  Location: [facility];  Service: Cardiovascular;  Laterality: N/A;  . CARDIOVERSION N/A 03/05/2013   Procedure: CARDIOVERSION;  Surgeon: Sinclair Grooms, MD;  Location: [place];  Service: Cardiovascular;  Laterality: N/A;  BEDSIDE   . convergent afib abaltion [facility] 02/26/15  02/26/15   [facility] by Dr. Lehman Prom and Dr. Danise Mina  . ELECTROPHYSIOLOGIC STUDY N/A 11/24/2014   Procedure: Cardioversion;  Surgeon: Will Meredith Leeds, MD;  Location: [facility] CV LAB;  Service: Cardiovascular;  Laterality: N/A;  . ESOPHAGOGASTRODUODENOSCOPY N/A 04/09/2013   Procedure: ESOPHAGOGASTRODUODENOSCOPY (EGD) with possible Balloon Dilation;  Surgeon:  Garlan Fair, MD;  Location: [facility] ENDOSCOPY;  Service: Endoscopy;  Laterality: N/A;  . kidney stone removal     multiple  . knee sx  1985   both  . LAPAROSCOPIC RETROPUBIC PROSTATECTOMY  02/2007   hx prostate cancer  . ORIF ANKLE FRACTURE Right 10/2015  . ORIF ANKLE FRACTURE Right 11/03/2015   Procedure: ORIF RIGHT LISFRANK FOOT;  Surgeon: Newt Minion, MD;  Location: [facility];  Service: Orthopedics;  Laterality: Right;  . TEE WITHOUT CARDIOVERSION  08/25/2011   Procedure: TRANSESOPHAGEAL ECHOCARDIOGRAM (TEE);  Surgeon: Jettie Booze, MD;  Location: [place];  Service: Cardiovascular;  Laterality: N/A;  . TEE WITHOUT CARDIOVERSION  11/09/2011   Procedure: TRANSESOPHAGEAL ECHOCARDIOGRAM (TEE);  Surgeon: Jettie Booze, MD;  Location: [facility] ENDOSCOPY;  Service: Cardiovascular;  Laterality: N/A;  h/p in file drawer  . TEE WITHOUT CARDIOVERSION N/A 02/05/2013   Procedure: TRANSESOPHAGEAL ECHOCARDIOGRAM (TEE);  Surgeon: Candee Furbish, MD;  Location: [facility] ENDOSCOPY;  Service:  Cardiovascular;  Laterality: N/A;  . THYROIDECTOMY  2009   cysts removal   . wrist sx  1980   right   Social History   Occupational History  . Not on file.   Social History Main Topics  . Smoking status: Never Smoker  . Smokeless tobacco: Never Used  . Alcohol use No  . Drug use: No  . Sexual activity: Yes

## 2016-05-11 ENCOUNTER — Ambulatory Visit (INDEPENDENT_AMBULATORY_CARE_PROVIDER_SITE_OTHER): Payer: Medicare Other | Admitting: Family

## 2016-05-11 DIAGNOSIS — T3390XD Superficial frostbite of unspecified sites, subsequent encounter: Secondary | ICD-10-CM | POA: Diagnosis not present

## 2016-05-18 NOTE — Progress Notes (Signed)
Office Visit Note   Patient: [name] [name]           Date of Birth: 1950-07-12           MRN: [id number] Visit Date: 05/11/2016              Requested by: Leanna Battles, MD [address] [place], [place] [address] PCP: Donnajean Lopes, MD  No chief complaint on file.   HPI: The patient is a 66 year old gentleman who him is seen in follow-up for frostbite left foot. He was out snow with boots on shoveling snow for several hours. Later today no discoloration to the tips of 4 toes on the left foot. Today he has wife have seen a little improvement in discoloration. There are no open wounds. Does complain of burning and tingling pain in the toes.  Does take chronic xarelto to therapy.    Assessment & Plan: Visit Diagnoses:  1. Frostbite, subsequent encounter     Plan: Discussed that they will just continue with conservative measures. Should any ulcers open up he has Silvadene he will use for wound care. And returned. I feel that this will resolve.  Follow-Up Instructions: Return in about 4 weeks (around 06/08/2016), or if symptoms worsen or fail to improve.   Ortho Exam Physical Exam  Constitutional: Appears well-developed.  Head: Normocephalic.  Eyes: EOM are normal.  Neck: Normal range of motion.  Cardiovascular: Normal rate.   Pulmonary/Chest: Effort normal.  Neurological: Is alert.  Skin: Skin is warm.  Psychiatric: Has a normal mood and affect. Left foot: Him the great toe has improved coloration. No longer has any darkened discoloration. The second third and fourth toe discontinue the him peek distally. The they are pinking up, no longer beefy red. No drainage no swelling him no warmth no sign of infection  Imaging: No results found.  Orders:  No orders of the defined types were placed in this encounter.  No orders of the defined types were placed in this encounter.    Procedures: No procedures performed  Clinical Data: No additional  findings.  Subjective: Review of Systems  Constitutional: Negative for chills and fever.  Skin: Positive for color change. Negative for wound.    Objective: Vital Signs: There were no vitals taken for this visit.  Specialty Comments:  No specialty comments available.  PMFS History: Patient Active Problem List   Diagnosis Date Noted  . Diabetic polyneuropathy associated with type 2 diabetes mellitus ([place]) 03/17/2016  . Hyperlipidemia 12/21/2015  . Chronic combined systolic and diastolic CHF (congestive heart failure) ([place])   . Foot fracture, right 10/31/2015  . S/P ablation of atrial flutter-2013 and Oct 2014 11/24/2014  . Dyslipidemia 11/24/2014  . Chronic anticoagulation-Xarelto 11/24/2014  . Type 2 diabetes mellitus with stage 3 chronic kidney disease ([place]) 11/24/2014  . Hypothyroidism 11/24/2014  . Atrial fibrillation with RVR ([place]) 11/24/2014  . AKI (acute kidney injury) ([place])   . Syncope 11/22/2014  . PAF- DCCV Nov 2014- Amiodarone 11/22/2014  . Orthostatic hypotension 11/22/2014  . Hyperkalemia 11/22/2014  . Polycythemia 11/22/2014  . Atrial flutter with rapid ventricular response ([place])   . Acute bronchitis 08/08/2013  . TIA (transient ischemic attack) 07/23/2013  . Headache 07/23/2013  . Chronic combined systolic and diastolic heart failure ([place]) 03/13/2013  . Acute on chronic combined systolic and diastolic congestive heart failure, NYHA class 1 ([place]) 03/06/2013  . CKD (chronic kidney disease), stage III 03/06/2013  . Chronic diastolic heart failure ([place])   .  CHF exacerbation ([place]) 03/04/2013  . Flash pulmonary edema ([place]) 03/04/2013  . Atrial fibrillation ([place]) 10/24/2011  . Obstructive sleep apnea-C-pap intol 10/24/2011  . Hypertension 10/24/2011  . Obesity 10/24/2011   Past Medical History:  Diagnosis Date  . Anemia   . Arthritis    OA  . Atrial flutter ([place])    a. s/p RFCA 8/13  . Cancer (HCC)    HISTORY OF PROSTATE CANCER  . Chronic combined  systolic and diastolic CHF (congestive heart failure) ([place])    a. LVEF previously 35% felt to be due tachycardia;  b. 02/2013 Echo: EF 50-55%, no rwma, mildly dil LA/RA, mild to mod MR. C 11/2014 echo - ef 60-65%, unable to determine DD  . CKD (chronic kidney disease), stage III    hx of a/c renal failure during episode of diverticulitis 9/13 in [place], [place]  . Diabetes mellitus ([place])    TYPE 2   . Diverticulosis   . Erectile dysfunction   . Gastric ulcer    s/p prior surgery  . Headache   . Hypertension   . Hypothyroid   . Obesities, morbid ([place])   . Obstructive sleep apnea    noncompliant with CPAP  . PAF (paroxysmal atrial fibrillation) ([place])    a. failed DCCV and multiple anti-arrhythmic drugs (multaq/flecainide);   b. s/p  PVI isolation with ablation of AFib 8/13; c. repeat PVI 01/2013;  d. 02/2013 repeat DCCV->Amio load/xarelto.  . Renal calculi     Family History  Problem Relation Age of Onset  . Emphysema Mother   . Lung cancer Father   . Diabetes Brother   . Colon cancer Neg Hx     Past Surgical History:  Procedure Laterality Date  . ATRIAL FIBRILLATION ABLATION  12/06/11; 02/05/2013   Afib and atrial flutter ablation by Dr Rayann Heman; repeat PVI by Dr Rayann Heman 02/05/2013  . ATRIAL FIBRILLATION ABLATION N/A 12/06/2011   Procedure: ATRIAL FIBRILLATION ABLATION;  Surgeon: Thompson Grayer, MD;  [facility] CATH LAB;  Service: Cardiovascular;  Laterality: N/A;  . ATRIAL FIBRILLATION ABLATION N/A 02/05/2013   Procedure: ATRIAL FIBRILLATION ABLATION;  Surgeon: Coralyn Mark, MD;  Location: [facility] CATH LAB;  Service: Cardiovascular;  Laterality: N/A;  . benign stomach tumor removal  2000  . CARDIOVERSION  08/25/2011   Procedure: CARDIOVERSION;  Surgeon: Jettie Booze, MD;  Location: [place];  Service: Cardiovascular;  Laterality: N/A;  . CARDIOVERSION N/A 03/05/2013   Procedure: CARDIOVERSION;  Surgeon: Sinclair Grooms, MD;  Location: [place];  Service: Cardiovascular;   Laterality: N/A;  BEDSIDE   . convergent afib abaltion [facility] 02/26/15  02/26/15   [facility] by Dr. Lehman Prom and Dr. Danise Mina  . ELECTROPHYSIOLOGIC STUDY N/A 11/24/2014   Procedure: Cardioversion;  Surgeon: Will Meredith Leeds, MD;  Location: [facility] CV LAB;  Service: Cardiovascular;  Laterality: N/A;  . ESOPHAGOGASTRODUODENOSCOPY N/A 04/09/2013   Procedure: ESOPHAGOGASTRODUODENOSCOPY (EGD) with possible Balloon Dilation;  Surgeon: Garlan Fair, MD;  Location: [facility] ENDOSCOPY;  Service: Endoscopy;  Laterality: N/A;  . kidney stone removal     multiple  . knee sx  1985   both  . LAPAROSCOPIC RETROPUBIC PROSTATECTOMY  02/2007   hx prostate cancer  . ORIF ANKLE FRACTURE Right 10/2015  . ORIF ANKLE FRACTURE Right 11/03/2015   Procedure: ORIF RIGHT LISFRANK FOOT;  Surgeon: Newt Minion, MD;  Location: [facility];  Service: Orthopedics;  Laterality: Right;  . TEE WITHOUT CARDIOVERSION  08/25/2011   Procedure: TRANSESOPHAGEAL ECHOCARDIOGRAM (TEE);  Surgeon: Jettie Booze, MD;  Location: [place];  Service: Cardiovascular;  Laterality: N/A;  . TEE WITHOUT CARDIOVERSION  11/09/2011   Procedure: TRANSESOPHAGEAL ECHOCARDIOGRAM (TEE);  Surgeon: Jettie Booze, MD;  Location: [facility] ENDOSCOPY;  Service: Cardiovascular;  Laterality: N/A;  h/p in file drawer  . TEE WITHOUT CARDIOVERSION N/A 02/05/2013   Procedure: TRANSESOPHAGEAL ECHOCARDIOGRAM (TEE);  Surgeon: Candee Furbish, MD;  Location: [facility] ENDOSCOPY;  Service: Cardiovascular;  Laterality: N/A;  . THYROIDECTOMY  2009   cysts removal   . wrist sx  1980   right   Social History   Occupational History  . Not on file.   Social History Main Topics  . Smoking status: Never Smoker  . Smokeless tobacco: Never Used  . Alcohol use No  . Drug use: No  . Sexual activity: Yes

## 2016-07-12 ENCOUNTER — Telehealth: Payer: Self-pay | Admitting: Interventional Cardiology

## 2016-07-12 NOTE — Telephone Encounter (Signed)
OK to increase carvedilol to previous dose of 25 mg BID.

## 2016-07-12 NOTE — Telephone Encounter (Signed)
Spoke to patient and his wife regarding elevated BP. Pt stated elevated BP for the last month, especially in the evenings. Pt's last BP at rest was 164/99. Pt is currently taking 12.5 mg Coreg BID. No reported symptoms of SOB, tachycardia, or diaphoresis. Pt did state he was having headahces. Patient was taking Coreg 25 mg BID and Losartan  prior to surgery. Pt wants to know if his medications need to be increased. Message routed to Dr. Irish Lack for review and recommendation.

## 2016-07-12 NOTE — Telephone Encounter (Signed)
New Message:    Pt having problems with his blood pressure being  A little high,especially at night.Should he increase his medicine,or does he need to be seen? Please call to advise.

## 2016-07-13 MED ORDER — CARVEDILOL 25 MG PO TABS: 25.0000 mg | ORAL_TABLET | Freq: Two times a day (BID) | ORAL | 3 refills | Status: DC

## 2016-07-13 NOTE — Telephone Encounter (Signed)
Pt called and made aware of Dr. Hassell Done recommendation of increasing the carvedilol to 25 mg twice a day. Prescription sent to patient's preferred pharmacy. Pt verbalized understanding and is in agreement with this plan.

## 2016-07-17 ENCOUNTER — Telehealth: Payer: Self-pay | Admitting: Physician Assistant

## 2016-07-17 NOTE — Telephone Encounter (Signed)
[name] [name] is a 66 y.o. male with history of atrial fibrillation status post prior ablative therapy. He required convergent procedure at [facility] last year. His blood pressure has recently gone up and he called the office last week. His carvedilol was changed from 12.5 mg twice daily to 25 mg twice daily. His wife called the answering service today because his blood pressure has been running 50-539 systolic since yesterday. He feels lightheaded/dizzy. He denies syncope. He denies chest pain or shortness of breath.  PLAN: 1. I have advised the patient's wife to elevate his feet and liberalize salt in his diet today. 2. Decrease carvedilol back to 12.5 g twice a day. 3. If systolic blood pressure is less than 140 this evening, hold carvedilol. 4. Consider carvedilol 18.75 mg twice a day if blood pressure difficult to control. 5. Go to ED if he has syncope, feels worse or SBP is < 90. She agrees with this plan.  Signed,  Richardson Dopp, PA-C    07/17/2016 12:24 PM

## 2016-07-18 ENCOUNTER — Telehealth: Payer: Self-pay | Admitting: Interventional Cardiology

## 2016-07-18 MED ORDER — CARVEDILOL 12.5 MG PO TABS: 12.5000 mg | ORAL_TABLET | Freq: Two times a day (BID) | ORAL | 3 refills | Status: DC

## 2016-07-18 NOTE — Telephone Encounter (Signed)
Patient is feeling fine this morning with BP of 108/74 and HR 55. Patient is going to hold his morning dose of carvedilol, and recheck his BP before his PM dose. Patient stated last night that SBP was in the 160's so he did take his evening dose of coreg 12.5 mg. Patient needed a refill on the smaller dose pill because wife was having trouble cutting pills in half. Sent in the patient's pharmacy Coreg 12.5 mg by mouth twice daily. Encouraged patient to record his BP for the next week. Informed patient to do as Richardson Dopp PA advised, and consider carvedilol 18.75 mg twice a day if BP difficult to control. Patient will give our office a call if he is needing to take more than 12.5 mg BID, so we can change his prescription. Patient verbalized understanding.

## 2016-07-18 NOTE — Telephone Encounter (Signed)
°  New message   Pt wife verbalized that she is calling to get an appt for the blood pressure clinic

## 2016-07-18 NOTE — Telephone Encounter (Signed)
Spoke with pt's wife and she reports that he has been getting dizzy upon standing. She states last night he took 12.5mg  of his coreg but his pressure remains low when standing. She was planning to stop his coreg completely. Advised she cut back to 6.25mg  and keep track of pressures. Appt moved to 3/18. She states appreciation.

## 2016-07-18 NOTE — Telephone Encounter (Signed)
Spoke with wife and scheduled pt with HTN clinic on 08/01/16.  Tried to schedule for first available on 4/19 but wife states they will be out of town 4/19-22.  She would like sooner appt if possible.  Advised I would send message to [facility] to see if pt can be seen sooner.  Wife appreciative for assistance.

## 2016-07-18 NOTE — Telephone Encounter (Signed)
Agree. Richardson Dopp, PA-C    07/18/2016 1:01 PM

## 2016-07-27 ENCOUNTER — Ambulatory Visit (INDEPENDENT_AMBULATORY_CARE_PROVIDER_SITE_OTHER): Payer: Medicare Other | Admitting: Pharmacist

## 2016-07-27 ENCOUNTER — Ambulatory Visit: Payer: Medicare Other

## 2016-07-27 VITALS — BP 116/78 | HR 70

## 2016-07-27 DIAGNOSIS — I10 Essential (primary) hypertension: Secondary | ICD-10-CM

## 2016-07-27 MED ORDER — CARVEDILOL 12.5 MG PO TABS: ORAL_TABLET | ORAL | 3 refills | Status: DC

## 2016-07-27 NOTE — Patient Instructions (Signed)
Increase your evening dose of carvedilol to 1.5 tablets (18.75mg ) in the evening, continue your usual 12.5mg  morning dose  Continue to check your blood pressure twice daily  Call clinic with any concerns

## 2016-07-27 NOTE — Progress Notes (Signed)
Patient ID: [name] [name]                 DOB: 1951-04-01                      MRN: [id number]     HPI: [name] [name] is a 66 y.o. male patient of [name] [name] who presents to HTN clinic. PMH is significant for afib s/p convergent ablation in 2016, DM2, HFpEF, HLD, OSA, CKD, and obesity. Over the past few weeks, patient's carvedilol was increased from 12.5mg  to 25mg  BID due to pt reported elevated BP readings. He then experienced low BP readings with systolic ranging 24-580 and was advised to reduce dose back to 12.5mg  BID. Pt called again a few days later with complaints of dizziness upon standing. He was advised to decrease carvedilol to 6.25mg  BID and presents today for follow up.  Pt states he has been taking carvedilol 12.5mg  BID. His readings during the day have generally been well controlled, ranging 110-130s/70-80s. About half of his night time readings ~9pm are elevated to 160-180s/100s (takes carvedilol at 8am and 7pm). He does occasionally have a headache when his readings are that elevated. Denies blurred vision or falls. Has not been dizzy upon standing since reducing carvedilol back to 12.5mg  BID dosing. He had one low BP reading of 80s/50s when he was taking carvedilol 25mg  BID, he had also taken Flomax the evening before which could have contributed as well. Pt brings in his home bicep cuff to clinic and it matches clinic reading.   Current HTN meds: carvedilol 6.25mg  BID, diltiazem 30mg  Q4H prn BP goal: <130/83mmHg  Family History: The patient's family history includes Diabetes in his brother; Emphysema in his mother; Lung cancer in his father.   Social History: The patient  reports that he has never smoked. He has never used smokeless tobacco. He reports that he does not drink alcohol or use drugs.  Diet: Drinks a cup of coffee in the morning, otherwise minimal caffeine. Limits salt intake.  Wt Readings from Last 3 Encounters:  12/21/15 230 lb (104.3 kg)  04/21/15 236 lb 3.2  oz (107.1 kg)  11/27/14 232 lb 3.2 oz (105.3 kg)   BP Readings from Last 3 Encounters:  12/21/15 116/80  11/04/15 (!) 108/55  05/23/15 136/84   Pulse Readings from Last 3 Encounters:  12/21/15 64  11/04/15 61  05/23/15 108    Renal function: CrCl cannot be calculated (Patient's most recent lab result is older than the maximum 21 days allowed.).  Past Medical History:  Diagnosis Date  . Anemia   . Arthritis    OA  . Atrial flutter ([place])    a. s/p RFCA 8/13  . Cancer (HCC)    HISTORY OF PROSTATE CANCER  . Chronic combined systolic and diastolic CHF (congestive heart failure) ([place])    a. LVEF previously 35% felt to be due tachycardia;  b. 02/2013 Echo: EF 50-55%, no rwma, mildly dil LA/RA, mild to mod MR. C 11/2014 echo - ef 60-65%, unable to determine DD  . CKD (chronic kidney disease), stage III    hx of a/c renal failure during episode of diverticulitis 9/13 in [place], [place]  . Diabetes mellitus ([place])    TYPE 2   . Diverticulosis   . Erectile dysfunction   . Gastric ulcer    s/p prior surgery  . Headache   . Hypertension   . Hypothyroid   . Obesities, morbid ([place])   .  Obstructive sleep apnea    noncompliant with CPAP  . PAF (paroxysmal atrial fibrillation) ([place])    a. failed DCCV and multiple anti-arrhythmic drugs (multaq/flecainide);   b. s/p  PVI isolation with ablation of AFib 8/13; c. repeat PVI 01/2013;  d. 02/2013 repeat DCCV->Amio load/xarelto.  . Renal calculi     Current Outpatient Prescriptions on File Prior to Visit  Medication Sig Dispense Refill  . albuterol (PROVENTIL) (2.5 MG/3ML) 0.083% nebulizer solution Take 2.5 mg by nebulization 2 (two) times daily as needed for wheezing or shortness of breath.     . Canagliflozin (INVOKANA) 300 MG TABS Take 1 tablet by mouth daily.    . carvedilol (COREG) 12.5 MG tablet Take 1 tablet (12.5 mg total) by mouth 2 (two) times daily. 180 tablet 3  . Cholecalciferol (VITAMIN D) 2000 UNITS CAPS Take 2,000 Units by  mouth daily.    Marland Kitchen diltiazem (CARDIZEM) 30 MG tablet Take 1 tablet (30 mg total) by mouth as needed (Every 4 hours AS NEEDED with a HR >161 and Systolic >096 (BP)). 30 tablet 3  . DULoxetine (CYMBALTA) 30 MG capsule Take 1 capsule by mouth every morning.    . ferrous sulfate 325 (65 FE) MG tablet Take 1 tablet (325 mg total) by mouth daily with breakfast. 90 tablet 1  . folic acid (FOLVITE) 1 MG tablet Take 1 mg by mouth daily.    Marland Kitchen GLUCOSAMINE PO Take 1,000 mg by mouth 2 (two) times daily.     Marland Kitchen imipramine (TOFRANIL) 25 MG tablet Take 50 mg by mouth at bedtime.     Marland Kitchen levothyroxine (SYNTHROID, LEVOTHROID) 150 MCG tablet Take 150 mcg by mouth daily before breakfast.    . Multiple Vitamins-Minerals (MENS 50+ MULTI VITAMIN/MIN PO) Take 1 tablet by mouth daily.    . pantoprazole (PROTONIX) 40 MG tablet Take 40 mg by mouth daily.     . polycarbophil (FIBERCON) 625 MG tablet Take 1,250 mg by mouth daily.    . polyethylene glycol (MIRALAX / GLYCOLAX) packet Take 17 g by mouth daily as needed for mild constipation. 10 each 0  . potassium chloride SA (K-DUR,KLOR-CON) 20 MEQ tablet Take 2 tablets (40 mEq total) by mouth daily. (Patient taking differently: Take 40 mEq by mouth 2 (two) times daily. ) 180 tablet 1  . pravastatin (PRAVACHOL) 20 MG tablet Take 10 mg by mouth at bedtime.     . Rivaroxaban (XARELTO) 15 MG TABS tablet Take 1 tablet (15 mg total) by mouth every evening. 90 tablet 3   No current facility-administered medications on file prior to visit.     Allergies  Allergen Reactions  . Phenergan [Promethazine Hcl] Itching    Hallucination   . Aspirin     Hx of ulcer   . Bystolic [Nebivolol Hcl] Swelling    Bradycardia   . Calcium Channel Blockers Swelling    LE edema  . Prednisone     Hx of ulcer  . Nsaids     Hx ulcer     Assessment/Plan:  1. Hypertension - BP during the day is controlled and at goal <130/86mmHg however half of evening readings are elevated to 045-409  systolic. Pt previously became hypotensive when he increased carvedilol from 12.5mg  to 25mg  BID. Will continue carvedilol 12.5mg  for the morning dose and increase evening dose to 18.75mg . Pt will continue to monitor BP at home and will call with any concerns.   Aydrien Froman E. Ryan Ogborn, PharmD, CPP, [facility]. [address], [place], [place] [address] Phone: [phone]; Fax: [phone] 07/27/2016 4:08 PM

## 2016-08-01 ENCOUNTER — Ambulatory Visit: Payer: Medicare Other

## 2016-08-04 DIAGNOSIS — E119 Type 2 diabetes mellitus without complications: Secondary | ICD-10-CM | POA: Diagnosis not present

## 2016-08-29 ENCOUNTER — Encounter (INDEPENDENT_AMBULATORY_CARE_PROVIDER_SITE_OTHER): Payer: Medicare Other | Admitting: Ophthalmology

## 2016-08-29 DIAGNOSIS — I10 Essential (primary) hypertension: Secondary | ICD-10-CM | POA: Diagnosis not present

## 2016-08-29 DIAGNOSIS — H35033 Hypertensive retinopathy, bilateral: Secondary | ICD-10-CM

## 2016-08-29 DIAGNOSIS — H43813 Vitreous degeneration, bilateral: Secondary | ICD-10-CM

## 2016-08-29 DIAGNOSIS — H2513 Age-related nuclear cataract, bilateral: Secondary | ICD-10-CM

## 2016-10-24 DIAGNOSIS — R109 Unspecified abdominal pain: Secondary | ICD-10-CM | POA: Diagnosis not present

## 2016-10-24 DIAGNOSIS — N200 Calculus of kidney: Secondary | ICD-10-CM | POA: Diagnosis not present

## 2016-10-24 DIAGNOSIS — R1084 Generalized abdominal pain: Secondary | ICD-10-CM | POA: Diagnosis not present

## 2016-10-27 DIAGNOSIS — E1129 Type 2 diabetes mellitus with other diabetic kidney complication: Secondary | ICD-10-CM | POA: Diagnosis not present

## 2016-10-27 DIAGNOSIS — E890 Postprocedural hypothyroidism: Secondary | ICD-10-CM | POA: Diagnosis not present

## 2016-10-27 DIAGNOSIS — Z125 Encounter for screening for malignant neoplasm of prostate: Secondary | ICD-10-CM | POA: Diagnosis not present

## 2016-10-27 DIAGNOSIS — N182 Chronic kidney disease, stage 2 (mild): Secondary | ICD-10-CM | POA: Diagnosis not present

## 2016-10-27 DIAGNOSIS — E782 Mixed hyperlipidemia: Secondary | ICD-10-CM | POA: Diagnosis not present

## 2016-11-03 DIAGNOSIS — E890 Postprocedural hypothyroidism: Secondary | ICD-10-CM | POA: Diagnosis not present

## 2016-11-03 DIAGNOSIS — Z1389 Encounter for screening for other disorder: Secondary | ICD-10-CM | POA: Diagnosis not present

## 2016-11-03 DIAGNOSIS — I480 Paroxysmal atrial fibrillation: Secondary | ICD-10-CM | POA: Diagnosis not present

## 2016-11-03 DIAGNOSIS — I129 Hypertensive chronic kidney disease with stage 1 through stage 4 chronic kidney disease, or unspecified chronic kidney disease: Secondary | ICD-10-CM | POA: Diagnosis not present

## 2016-11-03 DIAGNOSIS — E1129 Type 2 diabetes mellitus with other diabetic kidney complication: Secondary | ICD-10-CM | POA: Diagnosis not present

## 2016-11-03 DIAGNOSIS — E782 Mixed hyperlipidemia: Secondary | ICD-10-CM | POA: Diagnosis not present

## 2016-11-03 DIAGNOSIS — I5042 Chronic combined systolic (congestive) and diastolic (congestive) heart failure: Secondary | ICD-10-CM | POA: Diagnosis not present

## 2016-11-03 DIAGNOSIS — G4733 Obstructive sleep apnea (adult) (pediatric): Secondary | ICD-10-CM | POA: Diagnosis not present

## 2016-11-03 DIAGNOSIS — Z6835 Body mass index (BMI) 35.0-35.9, adult: Secondary | ICD-10-CM | POA: Diagnosis not present

## 2016-11-03 DIAGNOSIS — Z0000 Encounter for general adult medical examination without abnormal findings: Secondary | ICD-10-CM | POA: Diagnosis not present

## 2016-11-03 DIAGNOSIS — M25562 Pain in left knee: Secondary | ICD-10-CM | POA: Diagnosis not present

## 2016-11-04 DIAGNOSIS — Z1212 Encounter for screening for malignant neoplasm of rectum: Secondary | ICD-10-CM | POA: Diagnosis not present

## 2016-11-04 LAB — IFOBT (OCCULT BLOOD): IFOBT: POSITIVE

## 2016-12-07 ENCOUNTER — Encounter: Payer: Self-pay | Admitting: Internal Medicine

## 2017-01-12 DIAGNOSIS — Z23 Encounter for immunization: Secondary | ICD-10-CM | POA: Diagnosis not present

## 2017-01-31 ENCOUNTER — Encounter: Payer: Self-pay | Admitting: *Deleted

## 2017-02-03 ENCOUNTER — Ambulatory Visit (INDEPENDENT_AMBULATORY_CARE_PROVIDER_SITE_OTHER): Payer: Medicare Other | Admitting: Internal Medicine

## 2017-02-03 ENCOUNTER — Telehealth: Payer: Self-pay | Admitting: *Deleted

## 2017-02-03 ENCOUNTER — Encounter: Payer: Self-pay | Admitting: *Deleted

## 2017-02-03 VITALS — HR 68 | Ht 67.0 in | Wt 228.0 lb

## 2017-02-03 DIAGNOSIS — Z86010 Personal history of colonic polyps: Secondary | ICD-10-CM | POA: Diagnosis not present

## 2017-02-03 DIAGNOSIS — R195 Other fecal abnormalities: Secondary | ICD-10-CM | POA: Diagnosis not present

## 2017-02-03 DIAGNOSIS — Z7901 Long term (current) use of anticoagulants: Secondary | ICD-10-CM | POA: Diagnosis not present

## 2017-02-03 DIAGNOSIS — R112 Nausea with vomiting, unspecified: Secondary | ICD-10-CM | POA: Diagnosis not present

## 2017-02-03 NOTE — Patient Instructions (Signed)
You have been scheduled for a colonoscopy. Please follow written instructions given to you at your visit today.  If you use inhalers (even only as needed), please bring them with you on the day of your procedure. Your physician has requested that you go to www.startemmi.com and enter the access code given to you at your visit today. This web site gives a general overview about your procedure. However, you should still follow specific instructions given to you by our office regarding your preparation for the procedure.  You will be contaced by our office prior to your procedure for directions on holding your Xarelto.  If you do not hear from our office 1 week prior to your scheduled procedure, please call [phone] to discuss.  If you are age 25 or older, your body mass index should be between 23-30. Your Body mass index is 35.71 kg/m. If this is out of the aforementioned range listed, please consider follow up with your Primary Care Provider.  If you are age 82 or younger, your body mass index should be between 19-25. Your Body mass index is 35.71 kg/m. If this is out of the aformentioned range listed, please consider follow up with your Primary Care Provider.

## 2017-02-03 NOTE — Progress Notes (Signed)
Patient ID: [name] [name], male   DOB: Aug 23, 1950, 66 y.o.   MRN: [id number] HPI: [name] [name] is a 66 year old male with a past medical history of adenomatous colon polyps, diverticulosis and diverticulitis, GERD, atrial flutter status post ablation on chronic Xarelto, CK, diabetes, dyslipidemia, hypertension, hypothyroidism, sleep apnea who is seen in consultation at the request of Dr. Philip Aspen to discuss heme positive stool and history of colon polyps. He is here today with his wife.  He reports that he is feeling well though he has spontaneous vomiting about 70% of the time that he has a bowel movement. This is been going on for quite some time. He denies feeling constipated. He is not having diarrhea. He denies abdominal pain. Always occurs while he is defecating and he is not having nausea or vomiting outside of defecation. If he chews gum before using the bathroom it lessens the likelihood that this will occur. He reports his bowel movements occur once daily and are formed and not difficult to pass. He denies blood in his stool and melena. He reports he is eating well with no heartburn, dysphagia or odynophagia. He is taking Nexium daily. He has had previous endoscopy and colonoscopy.  He reports that he is using Xarelto daily. He's had prior ablation for atrial flutter.  He is a former patient of Dr. Wynetta Emery. His last colonoscopy was on 05/01/2012 where 2 small adenomatous polyps were removed. He had a normal EGD on 04/09/2013.  He has performed fecal occult blood testing through primary care and reports that these are positive nearly every year. He reports he will see occasional blood with wiping which she has a treated hemorrhoids in the setting of his anticoagulation.  No family history of GI tract malignancy. He is retired. He has 2 adult children. Never a tobacco user. No alcohol.  Past Medical History:  Diagnosis Date  . Arthritis    OA  . Atrial flutter ([place])    a. s/p RFCA 8/13   . Chronic combined systolic and diastolic CHF (congestive heart failure) ([place])    a. LVEF previously 35% felt to be due tachycardia;  b. 02/2013 Echo: EF 50-55%, no rwma, mildly dil LA/RA, mild to mod MR. C 11/2014 echo - ef 60-65%, unable to determine DD  . CKD (chronic kidney disease), stage III (HCC)    hx of a/c renal failure during episode of diverticulitis 9/13 in [place], [place]  . Diabetes mellitus ([place])    TYPE 2   . Diverticulitis   . Diverticulosis   . Dyslipidemia   . Erectile dysfunction   . Gastric ulcer    s/p prior surgery  . GERD (gastroesophageal reflux disease)   . Headache   . History of colonic diverticulitis   . History of prostate cancer   . Hypertension   . Hypothyroid   . Microcytic anemia   . Obesities, morbid ([place])   . Obstructive sleep apnea    noncompliant with CPAP  . PAF (paroxysmal atrial fibrillation) ([place])    a. failed DCCV and multiple anti-arrhythmic drugs (multaq/flecainide);   b. s/p  PVI isolation with ablation of AFib 8/13; c. repeat PVI 01/2013;  d. 02/2013 repeat DCCV->Amio load/xarelto.  . Peptic ulcer disease 1994  . Renal calculi   . Thrombocytopenia ([place]) 1992   idopathic-treated with danazol  . Tubular adenoma of colon     Past Surgical History:  Procedure Laterality Date  . ATRIAL FIBRILLATION ABLATION  12/06/11; 02/05/2013   Afib and atrial flutter  ablation by Dr Rayann Heman; repeat PVI by Dr Rayann Heman 02/05/2013  . ATRIAL FIBRILLATION ABLATION N/A 12/06/2011   Procedure: ATRIAL FIBRILLATION ABLATION;  Surgeon: Thompson Grayer, MD;  Location: [facility] CATH LAB;  Service: Cardiovascular;  Laterality: N/A;  . ATRIAL FIBRILLATION ABLATION N/A 02/05/2013   Procedure: ATRIAL FIBRILLATION ABLATION;  Surgeon: Coralyn Mark, MD;  Location: [facility] CATH LAB;  Service: Cardiovascular;  Laterality: N/A;  . benign stomach tumor removal  2000  . CARDIOVERSION  08/25/2011   Procedure: CARDIOVERSION;  Surgeon: Jettie Booze, MD;  Location: [facility];   Service: Cardiovascular;  Laterality: N/A;  . CARDIOVERSION N/A 03/05/2013   Procedure: CARDIOVERSION;  Surgeon: Sinclair Grooms, MD;  Location: [facility];  Service: Cardiovascular;  Laterality: N/A;  BEDSIDE   . convergent afib abaltion [facility] 02/26/15  02/26/15   [facility] by Dr. Lehman Prom and Dr. Danise Mina  . ELECTROPHYSIOLOGIC STUDY N/A 11/24/2014   Procedure: Cardioversion;  Surgeon: Will Meredith Leeds, MD;  Location: [facility] CV LAB;  Service: Cardiovascular;  Laterality: N/A;  . ESOPHAGOGASTRODUODENOSCOPY N/A 04/09/2013   Procedure: ESOPHAGOGASTRODUODENOSCOPY (EGD) with possible Balloon Dilation;  Surgeon: Garlan Fair, MD;  Location: [facility] ENDOSCOPY;  Service: Endoscopy;  Laterality: N/A;  . kidney stone removal     multiple  . KNEE ARTHROSCOPY Bilateral 1979  . LAPAROSCOPIC RETROPUBIC PROSTATECTOMY  02/2007   hx prostate cancer  . ORIF ANKLE FRACTURE Right 10/2015  . ORIF ANKLE FRACTURE Right 11/03/2015   Procedure: ORIF RIGHT LISFRANK FOOT;  Surgeon: Newt Minion, MD;  Location: [facility];  Service: Orthopedics;  Laterality: Right;  . TEE WITHOUT CARDIOVERSION  08/25/2011   Procedure: TRANSESOPHAGEAL ECHOCARDIOGRAM (TEE);  Surgeon: Jettie Booze, MD;  Location: [facility];  Service: Cardiovascular;  Laterality: N/A;  . TEE WITHOUT CARDIOVERSION  11/09/2011   Procedure: TRANSESOPHAGEAL ECHOCARDIOGRAM (TEE);  Surgeon: Jettie Booze, MD;  Location: [facility] ENDOSCOPY;  Service: Cardiovascular;  Laterality: N/A;  h/p in file drawer  . TEE WITHOUT CARDIOVERSION N/A 02/05/2013   Procedure: TRANSESOPHAGEAL ECHOCARDIOGRAM (TEE);  Surgeon: Candee Furbish, MD;  Location: [facility] ENDOSCOPY;  Service: Cardiovascular;  Laterality: N/A;  . TOTAL THYROIDECTOMY  2009   benign nodules removed  . WRIST SURGERY Right 1977   with placement of lunate prosthesis    Outpatient Medications Prior to Visit  Medication Sig Dispense Refill  . albuterol (PROVENTIL) (2.5 MG/3ML) 0.083% nebulizer solution Take 2.5 mg  by nebulization 2 (two) times daily as needed for wheezing or shortness of breath.     . Canagliflozin (INVOKANA) 300 MG TABS Take 1 tablet by mouth daily.    . carvedilol (COREG) 12.5 MG tablet Take 1 tablet by mouth in the morning and 1.5 tablets in the evening (Patient taking differently: 25 mg. Take 1 tablet by mouth in the morning and 1.5 tablets in the evening) 225 tablet 3  . Cholecalciferol (VITAMIN D) 2000 UNITS CAPS Take 2,000 Units by mouth daily.    Marland Kitchen diltiazem (CARDIZEM) 30 MG tablet Take 1 tablet (30 mg total) by mouth as needed (Every 4 hours AS NEEDED with a HR >259 and Systolic >563 (BP)). 30 tablet 3  . DULoxetine (CYMBALTA) 30 MG capsule Take 1 capsule by mouth every morning.    . ferrous sulfate 325 (65 FE) MG tablet Take 1 tablet (325 mg total) by mouth daily with breakfast. 90 tablet 1  . folic acid (FOLVITE) 1 MG tablet Take 1 mg by mouth daily.    Marland Kitchen GLUCOSAMINE PO Take 1,000 mg  by mouth 2 (two) times daily.     Marland Kitchen imipramine (TOFRANIL) 25 MG tablet Take 50 mg by mouth at bedtime.     Marland Kitchen levothyroxine (SYNTHROID, LEVOTHROID) 150 MCG tablet Take 150 mcg by mouth daily before breakfast.    . Multiple Vitamins-Minerals (MENS 50+ MULTI VITAMIN/MIN PO) Take 1 tablet by mouth daily.    . polycarbophil (FIBERCON) 625 MG tablet Take 1,250 mg by mouth daily.    . potassium chloride SA (K-DUR,KLOR-CON) 20 MEQ tablet Take 2 tablets (40 mEq total) by mouth daily. (Patient taking differently: Take 40 mEq by mouth 2 (two) times daily. ) 180 tablet 1  . pravastatin (PRAVACHOL) 20 MG tablet Take 10 mg by mouth at bedtime.     . Rivaroxaban (XARELTO) 15 MG TABS tablet Take 1 tablet (15 mg total) by mouth every evening. 90 tablet 3  . pantoprazole (PROTONIX) 40 MG tablet Take 40 mg by mouth daily.     . polyethylene glycol (MIRALAX / GLYCOLAX) packet Take 17 g by mouth daily as needed for mild constipation. 10 each 0   No facility-administered medications prior to visit.     Allergies   Allergen Reactions  . Phenergan [Promethazine Hcl] Itching    Hallucination   . Aspirin     Hx of ulcer   . Bystolic [Nebivolol Hcl] Swelling    Bradycardia   . Calcium Channel Blockers Swelling    LE edema  . Prednisone     Hx of ulcer  . Nsaids     Hx ulcer    Family History  Problem Relation Age of Onset  . Emphysema Mother   . Lung cancer Father   . Diabetes Brother   . Atrial fibrillation Brother   . Throat cancer Sister   . Lung cancer Sister   . Colon cancer Neg Hx     Social History  Substance Use Topics  . Smoking status: Never Smoker  . Smokeless tobacco: Never Used  . Alcohol use No    ROS: As per history of present illness, otherwise negative  Pulse 68   Ht 5\' 7"  (1.702 m)   Wt 228 lb (103.4 kg)   BMI 35.71 kg/m  Constitutional: Well-developed and well-nourished. No distress. HEENT: Normocephalic and atraumatic. Oropharynx is clear and moist. Conjunctivae are normal.  No scleral icterus. Neck: Neck supple. Trachea midline. Cardiovascular: Normal rate, regular rhythm and intact distal pulses. No M/R/G Pulmonary/chest: Effort normal and breath sounds normal. No wheezing, rales or rhonchi. Abdominal: Soft, obese, nontender, nondistended. Bowel sounds active throughout. There are no masses palpable. No hepatosplenomegaly. Extremities: no clubbing, cyanosis, or edema Neurological: Alert and oriented to person place and time. Skin: Skin is warm and dry.  Psychiatric: Normal mood and affect. Behavior is normal.  RELEVANT LABS AND IMAGING: Labs reviewed from primary care, hemosure positive July 2018  Labs dated August 3419 -- metabolic panel normal except for mild elevation in glucose at 149. Creatinine is 1.3. AST, ALT, bili and alkaline phosphatase all normal Hemoglobin 14.9, MCV 84.9, platelet count 227, WBC 6.4  ASSESSMENT/PLAN: 66 year old male with a past medical history of adenomatous colon polyps, diverticulosis and diverticulitis, GERD,  atrial flutter status post ablation on chronic Xarelto, CK, diabetes, dyslipidemia, hypertension, hypothyroidism, sleep apnea who is seen in consultation at the request of Dr. Philip Aspen to discuss heme positive stool and history of colon polyps.  1. History of adenomatous colon polyps/heme positive stool -- I recommended that we proceed with surveillance colonoscopy given that  it has been nearly 5 years since his last colonoscopy where 2 small adenomatous polyps were removed. Also with a heme positive stool this warrants repeat colonoscopy. We discussed the risks, benefits and alternatives and he is agreeable and wishes to proceed. --Will hold Eliquis 2 days prior to endoscopic procedures - will instruct when and how to resume after procedure. Benefits and risks of procedure explained including risks of bleeding, perforation, infection, missed lesions, reactions to medications and possible need for hospitalization and surgery for complications. Additional rare but real risk of stroke or other vascular clotting events off Eliquis also explained and need to seek urgent help if any signs of these problems occur. Will communicate by phone or EMR with patient's  prescribing provider to confirm that holding Eliquis is reasonable in this case.   2. Vomiting associated with defecation -- this may be a vagal type response to defecation. He denies feeling constipated and he is not having abdominal cramping or pain associated with vomiting. Chewing gum seems to alleviate this symptom for him part of the time. He has had prior upper endoscopy which was unremarkable. Unclear etiology though very likely neuropathic in nature. Will see if he has vomiting associated with bowel preparation and if not consider addition of laxative at low dose to see if this may be in fact be constipation or colonic spasm leading to this symptom. Also consider anti-spasmodic therapy.       MO:[id number], Quillian Quince, [place], [place] [address]

## 2017-02-03 NOTE — Telephone Encounter (Signed)
   [name] [name] 31-Jan-1951 [id number]   Dear Dr Allred/Cardiology Team:  We have scheduled the above named patient for a(n) colonoscopy procedure. Our records show that (s)he is on anticoagulation therapy.  Please advise as to whether the patient may come off their therapy of Xarelto 2 days prior to their procedure which is scheduled for 03/15/17.  Please route your response to Dixon Boos, CMA .  Sincerely,  [facility]

## 2017-02-08 NOTE — Telephone Encounter (Signed)
Patient with diagnosis of atrial fibrillation on Xarelto for anticoagulation.    Procedure: colonoscopy Date of procedure: 03/15/17  CHADS2 score of 3 (CHF, HTN, DM2);  CHADS2-VASc score of  4 (CHF, HTN, AGE, DM2)  CrCl 81.4  Per office protocol, patient can hold Xarelto for 2 days prior to procedure.    Patient should restart Xarelto on the evening of procedure or day after, at discretion of procedure MD

## 2017-02-08 NOTE — Telephone Encounter (Signed)
I have spoken to patient to advise that he has been cleared to hold xarelto 2 days prior to procedure and to restart evening of procedure or day after at discretion of Dr Hilarie Fredrickson pending procedure results. He verbalizes understanding.

## 2017-03-15 ENCOUNTER — Other Ambulatory Visit: Payer: Self-pay

## 2017-03-15 ENCOUNTER — Encounter: Payer: Self-pay | Admitting: Internal Medicine

## 2017-03-15 ENCOUNTER — Ambulatory Visit (AMBULATORY_SURGERY_CENTER): Payer: Medicare Other | Admitting: Internal Medicine

## 2017-03-15 VITALS — BP 149/80 | HR 63 | Temp 98.2°F | Resp 12 | Ht 67.0 in | Wt 228.0 lb

## 2017-03-15 DIAGNOSIS — R195 Other fecal abnormalities: Secondary | ICD-10-CM

## 2017-03-15 DIAGNOSIS — D123 Benign neoplasm of transverse colon: Secondary | ICD-10-CM

## 2017-03-15 DIAGNOSIS — Z86010 Personal history of colonic polyps: Secondary | ICD-10-CM

## 2017-03-15 DIAGNOSIS — D122 Benign neoplasm of ascending colon: Secondary | ICD-10-CM

## 2017-03-15 MED ORDER — SODIUM CHLORIDE 0.9 % IV SOLN: 500.0000 mL | Freq: Once | INTRAVENOUS | Status: DC

## 2017-03-15 NOTE — Patient Instructions (Signed)
YOU HAD AN ENDOSCOPIC PROCEDURE TODAY AT [facility]:   Refer to the procedure report that was given to you for any specific questions about what was found during the examination.  If the procedure report does not answer your questions, please call your gastroenterologist to clarify.  If you requested that your care partner not be given the details of your procedure findings, then the procedure report has been included in a sealed envelope for you to review at your convenience later.  YOU SHOULD EXPECT: Some feelings of bloating in the abdomen. Passage of more gas than usual.  Walking can help get rid of the air that was put into your GI tract during the procedure and reduce the bloating. If you had a lower endoscopy (such as a colonoscopy or flexible sigmoidoscopy) you may notice spotting of blood in your stool or on the toilet paper. If you underwent a bowel prep for your procedure, you may not have a normal bowel movement for a few days.  Please Note:  You might notice some irritation and congestion in your nose or some drainage.  This is from the oxygen used during your procedure.  There is no need for concern and it should clear up in a day or so.  SYMPTOMS TO REPORT IMMEDIATELY:   Following lower endoscopy (colonoscopy or flexible sigmoidoscopy):  Excessive amounts of blood in the stool  Significant tenderness or worsening of abdominal pains  Swelling of the abdomen that is new, acute  Fever of 100F or higher  For urgent or emergent issues, a gastroenterologist can be reached at any hour by calling [phone].   DIET:  We do recommend a small meal at first, but then you may proceed to your regular diet.  Drink plenty of fluids but you should avoid alcoholic beverages for 24 hours.  ACTIVITY:  You should plan to take it easy for the rest of today and you should NOT DRIVE or use heavy machinery until tomorrow (because of the sedation medicines used during the test).     FOLLOW UP: Our staff will call the number listed on your records the next business day following your procedure to check on you and address any questions or concerns that you may have regarding the information given to you following your procedure. If we do not reach you, we will leave a message.  However, if you are feeling well and you are not experiencing any problems, there is no need to return our call.  We will assume that you have returned to your regular daily activities without incident.  If any biopsies were taken you will be contacted by phone or by letter within the next 1-3 weeks.  Please call us at [phone] if you have not heard about the biopsies in 3 weeks.   Await for biopsy results to determine next repeat Colonoscopy screening Polyps (handout given) Diverticulosis (handout given) Hemorrhoids (handout given) High Fiber Diet (handout given) Resume Xarelto at prior dose tomorrow  SIGNATURES/CONFIDENTIALITY: You and/or your care partner have signed paperwork which will be entered into your electronic medical record.  These signatures attest to the fact that that the information above on your After Visit Summary has been reviewed and is understood.  Full responsibility of the confidentiality of this discharge information lies with you and/or your care-partner.

## 2017-03-15 NOTE — Progress Notes (Signed)
To PACU, VSS. Report to [email or web address] 

## 2017-03-15 NOTE — Progress Notes (Signed)
Called to room to assist during endoscopic procedure.  Patient ID and intended procedure confirmed with present staff. Received instructions for my participation in the procedure from the performing physician.  

## 2017-03-15 NOTE — Op Note (Signed)
[place] Patient Name: Javyon Fontan Procedure Date: 03/15/2017 2:52 PM MRN: [id number] Endoscopist: Jerene Bears , MD Age: 66 Referring MD:  Date of Birth: 21-Dec-1950 Gender: Male Account #: [id number] Procedure:                Colonoscopy Indications:              Surveillance: Personal history of adenomatous                            polyps on last colonoscopy nearly 5 years ago,                            incidental - Heme positive stool Medicines:                Monitored Anesthesia Care Procedure:                Pre-Anesthesia Assessment:                           - Prior to the procedure, a History and Physical                            was performed, and patient medications and                            allergies were reviewed. The patient's tolerance of                            previous anesthesia was also reviewed. The risks                            and benefits of the procedure and the sedation                            options and risks were discussed with the patient.                            All questions were answered, and informed consent                            was obtained. Prior Anticoagulants: The patient has                            taken Xarelto (rivaroxaban), last dose was 2 days                            prior to procedure. ASA Grade Assessment: III - A                            patient with severe systemic disease. After                            reviewing the risks and benefits, the patient was  deemed in satisfactory condition to undergo the                            procedure.                           After obtaining informed consent, the colonoscope                            was passed under direct vision. Throughout the                            procedure, the patient's blood pressure, pulse, and                            oxygen saturations were monitored continuously. The          Colonoscope was introduced through the anus and                            advanced to the the cecum, identified by                            appendiceal orifice and ileocecal valve. The                            quality of the bowel preparation was good. The                            ileocecal valve, appendiceal orifice, and rectum                            were photographed. Scope In: 2:59:16 PM Scope Out: 3:36:54 PM Scope Withdrawal Time: 0 hours 32 minutes 1 second  Total Procedure Duration: 0 hours 37 minutes 38 seconds  Findings:                 The digital rectal exam was normal.                           Four sessile polyps were found in the ascending                            colon. The polyps were 4 to 7 mm in size. These                            polyps were removed with a cold snare. Resection                            and retrieval were complete.                           Five sessile polyps were found in the transverse                            colon. The  polyps were 4 to 6 mm in size. These                            polyps were removed with a cold snare. Resection                            and retrieval were complete.                           Multiple small and large-mouthed diverticula were                            found from ascending colon to sigmoid colon.                           Internal hemorrhoids were found during                            retroflexion. The hemorrhoids were small. Complications:            No immediate complications. Estimated Blood Loss:     Estimated blood loss was minimal. Impression:               - Four 4 to 7 mm polyps in the ascending colon,                            removed with a cold snare. Resected and retrieved.                           - Five 4 to 6 mm polyps in the transverse colon,                            removed with a cold snare. Resected and retrieved.                           - Moderate diverticulosis  from ascending colon to                            sigmoid colon.                           - Internal hemorrhoids. Recommendation:           - Patient has a contact number available for                            emergencies. The signs and symptoms of potential                            delayed complications were discussed with the                            patient. Return to normal activities tomorrow.  Written discharge instructions were provided to the                            patient.                           - Resume previous diet.                           - Continue present medications.                           - Resume Xarelto (rivaroxaban) at prior dose                            tomorrow.                           - Repeat colonoscopy date to be determined after                            pending pathology results are reviewed for                            surveillance. Jerene Bears, MD 03/15/2017 3:42:06 PM This report has been signed electronically.

## 2017-03-16 ENCOUNTER — Telehealth: Payer: Self-pay

## 2017-03-16 NOTE — Telephone Encounter (Signed)
  Follow up Call-  Call back number 03/15/2017  Post procedure Call Back phone  # [phone]  Permission to leave phone message Yes  Some recent data might be hidden     Patient questions:  Do you have a fever, pain , or abdominal swelling? No. Pain Score  0 *  Have you tolerated food without any problems? Yes.    Have you been able to return to your normal activities? Yes.    Do you have any questions about your discharge instructions: Diet   No. Medications  No. Follow up visit  No.  Do you have questions or concerns about your Care? No.  Actions: * If pain score is 4 or above: No action needed, pain <4.

## 2017-03-22 ENCOUNTER — Encounter: Payer: Self-pay | Admitting: Internal Medicine

## 2017-03-31 ENCOUNTER — Other Ambulatory Visit (INDEPENDENT_AMBULATORY_CARE_PROVIDER_SITE_OTHER): Payer: Medicare Other

## 2017-03-31 ENCOUNTER — Other Ambulatory Visit: Payer: Self-pay

## 2017-03-31 ENCOUNTER — Telehealth: Payer: Self-pay | Admitting: Internal Medicine

## 2017-03-31 DIAGNOSIS — D649 Anemia, unspecified: Secondary | ICD-10-CM | POA: Diagnosis not present

## 2017-03-31 LAB — CBC WITH DIFFERENTIAL/PLATELET
BASOS ABS: 0.1 10*3/uL (ref 0.0–0.1)
BASOS PCT: 0.7 % (ref 0.0–3.0)
EOS ABS: 0.1 10*3/uL (ref 0.0–0.7)
Eosinophils Relative: 1.8 % (ref 0.0–5.0)
HEMATOCRIT: 39.4 % (ref 39.0–52.0)
HEMOGLOBIN: 13 g/dL (ref 13.0–17.0)
LYMPHS PCT: 29 % (ref 12.0–46.0)
Lymphs Abs: 2.3 10*3/uL (ref 0.7–4.0)
MCHC: 33 g/dL (ref 30.0–36.0)
MCV: 91 fl (ref 78.0–100.0)
MONO ABS: 0.7 10*3/uL (ref 0.1–1.0)
Monocytes Relative: 9.4 % (ref 3.0–12.0)
Neutro Abs: 4.6 10*3/uL (ref 1.4–7.7)
Neutrophils Relative %: 59.1 % (ref 43.0–77.0)
Platelets: 212 10*3/uL (ref 150.0–400.0)
RBC: 4.33 Mil/uL (ref 4.22–5.81)
RDW: 14.5 % (ref 11.5–15.5)
WBC: 7.8 10*3/uL (ref 4.0–10.5)

## 2017-03-31 NOTE — Telephone Encounter (Signed)
Spoke with pts wife and she is aware, order in for lab.

## 2017-03-31 NOTE — Telephone Encounter (Signed)
Pts wife states pt had colon 2 weeks ago and had 9 polyps removed. Pt is still on Xarelto. Wife states that pt had a BM this morning with BRB in it and the stool was very dark black. Wife wants to know if this is

## 2017-03-31 NOTE — Telephone Encounter (Signed)
He had multiple colon polypectomies nearly 2 weeks ago While it is not impossible to have a delayed post polypectomy bleed using cold technique, it is unlikely Would continue to monitor stool and if he has further blood in his stool or black tarry stools he needs to call the on-call physician If possible check CBC today Would have him continue Xarelto for now

## 2017-04-03 ENCOUNTER — Other Ambulatory Visit (INDEPENDENT_AMBULATORY_CARE_PROVIDER_SITE_OTHER): Payer: Medicare Other

## 2017-04-03 ENCOUNTER — Other Ambulatory Visit: Payer: Self-pay

## 2017-04-03 ENCOUNTER — Telehealth: Payer: Self-pay | Admitting: Internal Medicine

## 2017-04-03 ENCOUNTER — Telehealth: Payer: Self-pay | Admitting: Gastroenterology

## 2017-04-03 DIAGNOSIS — K921 Melena: Secondary | ICD-10-CM

## 2017-04-03 LAB — CBC WITH DIFFERENTIAL/PLATELET
Basophils Absolute: 0.1 10*3/uL (ref 0.0–0.1)
Basophils Relative: 1.5 % (ref 0.0–3.0)
EOS PCT: 1.9 % (ref 0.0–5.0)
Eosinophils Absolute: 0.1 10*3/uL (ref 0.0–0.7)
HCT: 38.1 % — ABNORMAL LOW (ref 39.0–52.0)
Hemoglobin: 12.4 g/dL — ABNORMAL LOW (ref 13.0–17.0)
LYMPHS ABS: 1.5 10*3/uL (ref 0.7–4.0)
Lymphocytes Relative: 23.6 % (ref 12.0–46.0)
MCHC: 32.6 g/dL (ref 30.0–36.0)
MCV: 91.2 fl (ref 78.0–100.0)
MONO ABS: 0.4 10*3/uL (ref 0.1–1.0)
MONOS PCT: 6.6 % (ref 3.0–12.0)
NEUTROS ABS: 4.3 10*3/uL (ref 1.4–7.7)
NEUTROS PCT: 66.4 % (ref 43.0–77.0)
PLATELETS: 222 10*3/uL (ref 150.0–400.0)
RBC: 4.18 Mil/uL — AB (ref 4.22–5.81)
RDW: 14.6 % (ref 11.5–15.5)
WBC: 6.5 10*3/uL (ref 4.0–10.5)

## 2017-04-03 NOTE — Telephone Encounter (Signed)
Pt came in for CBC. Pt is scheduled to see Amy Esterwood PA 04/19/17@1 :3pm. Please advise regarding cbc results.

## 2017-04-03 NOTE — Telephone Encounter (Signed)
Order in epic, pt to come for labs.

## 2017-04-03 NOTE — Telephone Encounter (Signed)
Hgb slightly trended down to 12.4 from 13. Continue to hold Xarelto (H/o afib/aflutter s/p cardioversion in 2016 curently in sinus rhythm). Called patient to inform results, didn't reach and left a message. Ok to restart Xarelto if stool changes to normal. Call with any worsening symptoms.

## 2017-04-03 NOTE — Telephone Encounter (Addendum)
Oncall Note  Patient's wife called with complaint that her husband is continuing to have black soft stool with maroon color when hits the water in toilet. He had a bowel movement on Friday and next on Sunday. Denies any other symptoms. Feeling well otherwise. Colonoscopy >2 weeks ago with removal of multiple small polyps (9). Slow GI bleed vs passing old blood from limited bleed from polypectomy sites.  Hgb was stable on Friday 03/31/17.  Recheck Hgb, advised him to hold Xarelto for now, if no significant change in Hgb, can start it back Follow up in office visit  K. Denzil Magnuson , MD [phone] Mon-Fri 8a-5p [phone] after 5p, weekends, holidays

## 2017-04-05 NOTE — Telephone Encounter (Signed)
Spoke to wife to make sure they did get the message about the CBC results. Advised of plan that okay to restart Xarelto if stools return to normal. Wife states that 2 days ago stool was still black, but yesterday it was normal color. They will wait to see what next one is like and if normal will restart the Xarelto back.

## 2017-04-10 NOTE — Telephone Encounter (Signed)
Please call patient to check on him Ensure no further black stools and see if he has resumed Xarelto. If further black stools, repeat CBC and hold Xarelto

## 2017-04-10 NOTE — Telephone Encounter (Signed)
Ive spoken to patient who states that he has had no further black stool since discontinuing xarelto. Upon questioning, patient states that he has NOT restarted xarelto. I advised that he may restart xarelto from a GI standpoint if he has not had any black stool recently. Patient states he has an appointment with Dr Irish Lack next week and wants to discuss with him before making restarting the medication.

## 2017-04-19 ENCOUNTER — Ambulatory Visit: Payer: Medicare Other | Admitting: Physician Assistant

## 2017-05-01 NOTE — Progress Notes (Signed)
Cardiology Office Note   Date:  05/02/2017   ID:  Juris, Gosnell 03-13-1951, MRN [id number]  PCP:  Leanna Battles, MD    No chief complaint on file.  AFib  Wt Readings from Last 3 Encounters:  05/02/17 232 lb (105.2 kg)  03/15/17 228 lb (103.4 kg)  02/03/17 228 lb (103.4 kg)       History of Present Illness: [name] [name] is a 67 y.o. male  with a h/o pesisitent afib s/p 2 ablations and in 11/16 underwent a convergent afib ablation with Dr. Lehman Prom and Dr. Danise Mina at [facility]. He had some complications with cardiogenic shock per pt and had to be intubated and spend 4 days in intensive care.  It was recommended that he stop his Multaq. He wanted to stop Xarelto.   He was very symptomatic in the past when he had AFib.  BP has been well controlled.    Denies : Chest pain. Dizziness. Leg edema. Nitroglycerin use. Orthopnea. Palpitations. Paroxysmal nocturnal dyspnea. Shortness of breath. Syncope.   He walks for exercise.  He has a step counter.  He walks about a mile.    Past Medical History:  Diagnosis Date  . Arthritis    OA  . Atrial flutter ([place])    a. s/p RFCA 8/13  . Cancer [facility]) 2010   prostate  . Chronic combined systolic and diastolic CHF (congestive heart failure) ([place])    a. LVEF previously 35% felt to be due tachycardia;  b. 02/2013 Echo: EF 50-55%, no rwma, mildly dil LA/RA, mild to mod MR. C 11/2014 echo - ef 60-65%, unable to determine DD  . CKD (chronic kidney disease), stage III (HCC)    hx of a/c renal failure during episode of diverticulitis 9/13 in [place], [place]  . 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 Diverticulitis   . Diverticulosis   . Dyslipidemia   . Erectile dysfunction   . Gastric ulcer    s/p prior surgery  . GERD (gastroesophageal reflux disease)   . Headache   . History of colonic diverticulitis   . History of prostate cancer   . Hypertension   . Hypothyroid   . Microcytic anemia   . Obesities, morbid ([place])   .  Obstructive sleep apnea    noncompliant with CPAP  . Osteoporosis   . PAF (paroxysmal atrial fibrillation) ([place])    a. failed DCCV and multiple anti-arrhythmic drugs (multaq/flecainide);   b. s/p  PVI isolation with ablation of AFib 8/13; c. repeat PVI 01/2013;  d. 02/2013 repeat DCCV->Amio load/xarelto.  . Peptic ulcer disease 1994  . Renal calculi   . Sleep apnea 2015   has not had sleep apnea since heart intervention  . Thrombocytopenia ([place]) 1992   idopathic-treated with danazol  . Tubular adenoma of colon     Past Surgical History:  Procedure Laterality Date  . ATRIAL FIBRILLATION ABLATION  12/06/11; 02/05/2013   Afib and atrial flutter ablation by Dr Rayann Heman; repeat PVI by Dr Rayann Heman 02/05/2013  . ATRIAL FIBRILLATION ABLATION N/A 12/06/2011   Procedure: ATRIAL FIBRILLATION ABLATION;  Surgeon: Thompson Grayer, MD;  Location: [facility] CATH LAB;  Service: Cardiovascular;  Laterality: N/A;  . ATRIAL FIBRILLATION ABLATION N/A 02/05/2013   Procedure: ATRIAL FIBRILLATION ABLATION;  Surgeon: Coralyn Mark, MD;  Location: [place] CATH LAB;  Service: Cardiovascular;  Laterality: N/A;  . benign stomach tumor removal  2000  . CARDIOVERSION  08/25/2011   Procedure: CARDIOVERSION;  Surgeon: Jettie Booze, MD;  Location: [place];  Service: Cardiovascular;  Laterality: N/A;  . CARDIOVERSION N/A 03/05/2013   Procedure: CARDIOVERSION;  Surgeon: Sinclair Grooms, MD;  Location: [place];  Service: Cardiovascular;  Laterality: N/A;  BEDSIDE   . convergent afib abaltion [facility] 02/26/15  02/26/15   [facility] by Dr. Lehman Prom and Dr. Danise Mina  . ELECTROPHYSIOLOGIC STUDY N/A 11/24/2014   Procedure: Cardioversion;  Surgeon: Will Meredith Leeds, MD;  Location: [place] CV LAB;  Service: Cardiovascular;  Laterality: N/A;  . ESOPHAGOGASTRODUODENOSCOPY N/A 04/09/2013   Procedure: ESOPHAGOGASTRODUODENOSCOPY (EGD) with possible Balloon Dilation;  Surgeon: Garlan Fair, MD;  Location: [facility] ENDOSCOPY;  Service: Endoscopy;   Laterality: N/A;  . kidney stone removal     multiple  . KNEE ARTHROSCOPY Bilateral 1979  . LAPAROSCOPIC RETROPUBIC PROSTATECTOMY  02/2007   hx prostate cancer  . ORIF ANKLE FRACTURE Right 10/2015  . ORIF ANKLE FRACTURE Right 11/03/2015   Procedure: ORIF RIGHT LISFRANK FOOT;  Surgeon: Newt Minion, MD;  Location: [place];  Service: Orthopedics;  Laterality: Right;  . TEE WITHOUT CARDIOVERSION  08/25/2011   Procedure: TRANSESOPHAGEAL ECHOCARDIOGRAM (TEE);  Surgeon: Jettie Booze, MD;  Location: [place];  Service: Cardiovascular;  Laterality: N/A;  . TEE WITHOUT CARDIOVERSION  11/09/2011   Procedure: TRANSESOPHAGEAL ECHOCARDIOGRAM (TEE);  Surgeon: Jettie Booze, MD;  Location: [facility] ENDOSCOPY;  Service: Cardiovascular;  Laterality: N/A;  h/p in file drawer  . TEE WITHOUT CARDIOVERSION N/A 02/05/2013   Procedure: TRANSESOPHAGEAL ECHOCARDIOGRAM (TEE);  Surgeon: Candee Furbish, MD;  Location: [facility] ENDOSCOPY;  Service: Cardiovascular;  Laterality: N/A;  . TOTAL THYROIDECTOMY  2009   benign nodules removed  . WRIST SURGERY Right 1977   with placement of lunate prosthesis     Current Outpatient Medications  Medication Sig Dispense Refill  . albuterol (PROVENTIL) (2.5 MG/3ML) 0.083% nebulizer solution Take 2.5 mg by nebulization 2 (two) times daily as needed for wheezing or shortness of breath.     . carvedilol (COREG) 12.5 MG tablet Take 1 tablet by mouth in the morning and 1.5 tablets in the evening (Patient taking differently: 25 mg. Take 1 tablet by mouth in the morning and 1.5 tablets in the evening) 225 tablet 3  . Cholecalciferol (VITAMIN D) 2000 UNITS CAPS Take 2,000 Units by mouth daily.    Marland Kitchen diltiazem (CARDIZEM) 30 MG tablet Take 1 tablet (30 mg total) by mouth as needed (Every 4 hours AS NEEDED with a HR >924 and Systolic >268 (BP)). 30 tablet 3  . DULoxetine (CYMBALTA) 30 MG capsule Take 1 capsule by mouth every morning.    Marland Kitchen esomeprazole (NEXIUM) 40 MG capsule Take 40 mg by  mouth daily at 12 noon.    . ferrous sulfate 325 (65 FE) MG tablet Take 1 tablet (325 mg total) by mouth daily with breakfast. 90 tablet 1  . folic acid (FOLVITE) 1 MG tablet Take 1 mg by mouth daily.    Marland Kitchen GLUCOSAMINE PO Take 1,000 mg by mouth 2 (two) times daily.     Marland Kitchen imipramine (TOFRANIL) 25 MG tablet Take 50 mg by mouth at bedtime.     Marland Kitchen levothyroxine (SYNTHROID, LEVOTHROID) 150 MCG tablet Take 150 mcg by mouth daily before breakfast.    . Multiple Vitamins-Minerals (MENS 50+ MULTI VITAMIN/MIN PO) Take 1 tablet by mouth daily.    . polycarbophil (FIBERCON) 625 MG tablet Take 1,250 mg by mouth daily.    . potassium chloride SA (K-DUR,KLOR-CON) 20 MEQ tablet  Take 2 tablets (40 mEq total) by mouth daily. (Patient taking differently: Take 40 mEq by mouth 2 (two) times daily. ) 180 tablet 1  . pravastatin (PRAVACHOL) 20 MG tablet Take 10 mg by mouth at bedtime.     . Rivaroxaban (XARELTO) 15 MG TABS tablet Take 1 tablet (15 mg total) by mouth every evening. 90 tablet 3   Current Facility-Administered Medications  Medication Dose Route Frequency Provider Last Rate Last Dose  . 0.9 %  sodium chloride infusion  500 mL Intravenous Once Pyrtle, Lajuan Lines, MD        Allergies:   Phenergan [promethazine hcl]; Aspirin; Bystolic [nebivolol hcl]; Calcium channel blockers; Prednisone; Promethazine; and Nsaids    Social History:  The patient  reports that  has never smoked. he has never used smokeless tobacco. He reports that he does not drink alcohol or use drugs.   Family History:  The patient's family history includes Atrial fibrillation in his brother; Diabetes in his brother; Emphysema in his mother; Lung cancer in his father and sister; Throat cancer in his sister.    ROS:  Please see the history of present illness.   Otherwise, review of systems are positive for GI bleed after polypectomy; he had to stop Xarelto for 2 weeks.   All other systems are reviewed and negative.    PHYSICAL EXAM: VS:  BP  126/80   Pulse 64   Ht 5\' 7"  (1.702 m)   Wt 232 lb (105.2 kg)   SpO2 98%   BMI 36.34 kg/m  , BMI Body mass index is 36.34 kg/m. GEN: Well nourished, well developed, in no acute distress  HEENT: normal  Neck: no JVD, carotid bruits, or masses Cardiac: RRR; no murmurs, rubs, or gallops,no edema  Respiratory:  clear to auscultation bilaterally, normal work of breathing GI: soft, nontender, nondistended, + BS MS: no deformity or atrophy  Skin: warm and dry, no rash Neuro:  Strength and sensation are intact Psych: euthymic mood, full affect   EKG:   The ekg ordered today demonstrates NSR, no ST changes   Recent Labs: 04/03/2017: Hemoglobin 12.4; Platelets 222.0   Lipid Panel    Component Value Date/Time   CHOL 181 07/24/2013 0530   TRIG 99 07/24/2013 0530   HDL 52 07/24/2013 0530   CHOLHDL 3.5 07/24/2013 0530   VLDL 20 07/24/2013 0530   LDLCALC 109 (H) 07/24/2013 0530     Other studies Reviewed: Additional studies/ records that were reviewed today with results demonstrating: LDL 80 in 7/18.   ASSESSMENT AND PLAN:  1. Persistent AFib: He wants to stop Xarelto.  No AFivb sx since his Convergent procedure at [facility].  Released from f/u at [facility].   2. HTN: We controlled.  Can continue current meds.  OK to come back as needed for BP checks since this hsa been well controlled.  Using Coreg 18.75 mg in AM and 25 mg in PM.  3. Morbid Obesity: AFib and HTN likely related to this problem.  Continue to try to lose weight. 4. Hyperlipidemia: Followed by PMD. LDL 80 in 7/18. 5. Chronic Xarelto: Will continue XArelto at this time. Will check with Dr. Rayann Heman and Dr. Lehman Prom.  No bleeding problems. Hbg stable in 12/18.  Check Cr.   Current medicines are reviewed at length with the patient today.  The patient concerns regarding his medicines were addressed.  The following changes have been made:  No change  Labs/ tests ordered today include:  No orders of  the defined types were placed in  this encounter.   Recommend 150 minutes/week of aerobic exercise Low fat, low carb, high fiber diet recommended  Disposition:   FU as needed   Signed, Larae Grooms, MD  05/02/2017 9:05 AM    [address], [place], [place]  [address] Phone: [phone]; Fax: [phone]

## 2017-05-02 ENCOUNTER — Ambulatory Visit (INDEPENDENT_AMBULATORY_CARE_PROVIDER_SITE_OTHER): Payer: Medicare Other | Admitting: Interventional Cardiology

## 2017-05-02 ENCOUNTER — Encounter: Payer: Self-pay | Admitting: Interventional Cardiology

## 2017-05-02 VITALS — BP 126/80 | HR 64 | Ht 67.0 in | Wt 232.0 lb

## 2017-05-02 DIAGNOSIS — E782 Mixed hyperlipidemia: Secondary | ICD-10-CM | POA: Diagnosis not present

## 2017-05-02 DIAGNOSIS — Z8679 Personal history of other diseases of the circulatory system: Secondary | ICD-10-CM | POA: Diagnosis not present

## 2017-05-02 DIAGNOSIS — Z9889 Other specified postprocedural states: Secondary | ICD-10-CM | POA: Diagnosis not present

## 2017-05-02 DIAGNOSIS — I480 Paroxysmal atrial fibrillation: Secondary | ICD-10-CM

## 2017-05-02 DIAGNOSIS — Z7901 Long term (current) use of anticoagulants: Secondary | ICD-10-CM

## 2017-05-02 LAB — BASIC METABOLIC PANEL
BUN / CREAT RATIO: 15 (ref 10–24)
BUN: 19 mg/dL (ref 8–27)
CO2: 18 mmol/L — AB (ref 20–29)
CREATININE: 1.27 mg/dL (ref 0.76–1.27)
Calcium: 9 mg/dL (ref 8.6–10.2)
Chloride: 107 mmol/L — ABNORMAL HIGH (ref 96–106)
GFR calc Af Amer: 68 mL/min/{1.73_m2} (ref >59)
GFR calc non Af Amer: 58 mL/min/{1.73_m2} — ABNORMAL LOW (ref >59)
GLUCOSE: 200 mg/dL — AB (ref 65–99)
POTASSIUM: 4.4 mmol/L (ref 3.5–5.2)
Sodium: 141 mmol/L (ref 134–144)

## 2017-05-02 MED ORDER — CARVEDILOL 12.5 MG PO TABS: 18.7500 mg | ORAL_TABLET | Freq: Every day | ORAL | 11 refills | Status: DC

## 2017-05-02 MED ORDER — CARVEDILOL 25 MG PO TABS: 25.0000 mg | ORAL_TABLET | Freq: Every day | ORAL | 3 refills | Status: DC

## 2017-05-02 MED ORDER — CARVEDILOL 12.5 MG PO TABS: 18.7500 mg | ORAL_TABLET | Freq: Every day | ORAL | 3 refills | Status: DC

## 2017-05-02 MED ORDER — RIVAROXABAN 15 MG PO TABS: 15.0000 mg | ORAL_TABLET | Freq: Every evening | ORAL | 3 refills | Status: DC

## 2017-05-02 MED ORDER — CARVEDILOL 25 MG PO TABS: 25.0000 mg | ORAL_TABLET | Freq: Every day | ORAL | 11 refills | Status: DC

## 2017-05-02 NOTE — Patient Instructions (Signed)
Medication Instructions:  Your physician recommends that you continue on your current medications as directed. Please refer to the Current Medication list given to you today.   Labwork: TODAY: BMET  Testing/Procedures: None ordered  Follow-Up: Your physician wants you to follow-up with Dr. Irish Lack AS NEEDED   Any Other Special Instructions Will Be Listed Below (If Applicable).     If you need a refill on your cardiac medications before your next appointment, please call your pharmacy.

## 2017-05-08 DIAGNOSIS — J09X2 Influenza due to identified novel influenza A virus with other respiratory manifestations: Secondary | ICD-10-CM | POA: Diagnosis not present

## 2017-05-08 DIAGNOSIS — J111 Influenza due to unidentified influenza virus with other respiratory manifestations: Secondary | ICD-10-CM | POA: Diagnosis not present

## 2017-05-08 DIAGNOSIS — R05 Cough: Secondary | ICD-10-CM | POA: Diagnosis not present

## 2017-05-08 DIAGNOSIS — Z6835 Body mass index (BMI) 35.0-35.9, adult: Secondary | ICD-10-CM | POA: Diagnosis not present

## 2017-05-16 NOTE — Progress Notes (Signed)
D/w Dr. Rayann Heman who felt that decision for anticoagulation should be based on our CHADS-Vasc scoring system, which would mean that he should stay on the Xarelto based on his RF for stroke.

## 2017-05-16 NOTE — Progress Notes (Signed)
Called and made patient aware that Dr. Irish Lack discussed with Dr. Rayann Heman and has determined that he should continue taking the Xarelto to help reduce his risk of stroke. Patient verbalized understanding and thanked me for the call.

## 2017-07-03 DIAGNOSIS — R1084 Generalized abdominal pain: Secondary | ICD-10-CM | POA: Diagnosis not present

## 2017-07-03 DIAGNOSIS — N200 Calculus of kidney: Secondary | ICD-10-CM | POA: Diagnosis not present

## 2017-07-20 ENCOUNTER — Other Ambulatory Visit: Payer: Self-pay | Admitting: Physician Assistant

## 2017-08-07 DIAGNOSIS — E1129 Type 2 diabetes mellitus with other diabetic kidney complication: Secondary | ICD-10-CM | POA: Diagnosis not present

## 2017-08-07 DIAGNOSIS — E782 Mixed hyperlipidemia: Secondary | ICD-10-CM | POA: Diagnosis not present

## 2017-08-07 DIAGNOSIS — M7711 Lateral epicondylitis, right elbow: Secondary | ICD-10-CM | POA: Diagnosis not present

## 2017-08-07 DIAGNOSIS — Z6836 Body mass index (BMI) 36.0-36.9, adult: Secondary | ICD-10-CM | POA: Diagnosis not present

## 2017-08-07 DIAGNOSIS — I129 Hypertensive chronic kidney disease with stage 1 through stage 4 chronic kidney disease, or unspecified chronic kidney disease: Secondary | ICD-10-CM | POA: Diagnosis not present

## 2017-08-07 DIAGNOSIS — I480 Paroxysmal atrial fibrillation: Secondary | ICD-10-CM | POA: Diagnosis not present

## 2017-08-07 DIAGNOSIS — I10 Essential (primary) hypertension: Secondary | ICD-10-CM | POA: Diagnosis not present

## 2017-08-07 DIAGNOSIS — G4733 Obstructive sleep apnea (adult) (pediatric): Secondary | ICD-10-CM | POA: Diagnosis not present

## 2017-08-29 ENCOUNTER — Ambulatory Visit (INDEPENDENT_AMBULATORY_CARE_PROVIDER_SITE_OTHER): Payer: Medicare Other | Admitting: Ophthalmology

## 2017-09-27 ENCOUNTER — Encounter (INDEPENDENT_AMBULATORY_CARE_PROVIDER_SITE_OTHER): Payer: Medicare Other | Admitting: Ophthalmology

## 2017-09-27 DIAGNOSIS — H35033 Hypertensive retinopathy, bilateral: Secondary | ICD-10-CM

## 2017-09-27 DIAGNOSIS — H2513 Age-related nuclear cataract, bilateral: Secondary | ICD-10-CM

## 2017-09-27 DIAGNOSIS — H43813 Vitreous degeneration, bilateral: Secondary | ICD-10-CM

## 2017-09-27 DIAGNOSIS — H353131 Nonexudative age-related macular degeneration, bilateral, early dry stage: Secondary | ICD-10-CM

## 2017-09-27 DIAGNOSIS — I10 Essential (primary) hypertension: Secondary | ICD-10-CM

## 2017-11-02 DIAGNOSIS — R82998 Other abnormal findings in urine: Secondary | ICD-10-CM | POA: Diagnosis not present

## 2017-11-02 DIAGNOSIS — I129 Hypertensive chronic kidney disease with stage 1 through stage 4 chronic kidney disease, or unspecified chronic kidney disease: Secondary | ICD-10-CM | POA: Diagnosis not present

## 2017-11-02 DIAGNOSIS — E1129 Type 2 diabetes mellitus with other diabetic kidney complication: Secondary | ICD-10-CM | POA: Diagnosis not present

## 2017-11-02 DIAGNOSIS — E782 Mixed hyperlipidemia: Secondary | ICD-10-CM | POA: Diagnosis not present

## 2017-11-02 DIAGNOSIS — E890 Postprocedural hypothyroidism: Secondary | ICD-10-CM | POA: Diagnosis not present

## 2017-11-02 DIAGNOSIS — Z125 Encounter for screening for malignant neoplasm of prostate: Secondary | ICD-10-CM | POA: Diagnosis not present

## 2017-11-09 DIAGNOSIS — M25511 Pain in right shoulder: Secondary | ICD-10-CM | POA: Diagnosis not present

## 2017-11-09 DIAGNOSIS — I10 Essential (primary) hypertension: Secondary | ICD-10-CM | POA: Diagnosis not present

## 2017-11-09 DIAGNOSIS — D126 Benign neoplasm of colon, unspecified: Secondary | ICD-10-CM | POA: Diagnosis not present

## 2017-11-09 DIAGNOSIS — Z0000 Encounter for general adult medical examination without abnormal findings: Secondary | ICD-10-CM | POA: Diagnosis not present

## 2017-11-09 DIAGNOSIS — E1121 Type 2 diabetes mellitus with diabetic nephropathy: Secondary | ICD-10-CM | POA: Diagnosis not present

## 2017-11-09 DIAGNOSIS — I5042 Chronic combined systolic (congestive) and diastolic (congestive) heart failure: Secondary | ICD-10-CM | POA: Diagnosis not present

## 2017-11-09 DIAGNOSIS — I480 Paroxysmal atrial fibrillation: Secondary | ICD-10-CM | POA: Diagnosis not present

## 2017-11-09 DIAGNOSIS — E1129 Type 2 diabetes mellitus with other diabetic kidney complication: Secondary | ICD-10-CM | POA: Diagnosis not present

## 2017-11-09 DIAGNOSIS — I129 Hypertensive chronic kidney disease with stage 1 through stage 4 chronic kidney disease, or unspecified chronic kidney disease: Secondary | ICD-10-CM | POA: Diagnosis not present

## 2017-11-09 DIAGNOSIS — E782 Mixed hyperlipidemia: Secondary | ICD-10-CM | POA: Diagnosis not present

## 2017-11-09 DIAGNOSIS — Z8546 Personal history of malignant neoplasm of prostate: Secondary | ICD-10-CM | POA: Diagnosis not present

## 2017-11-09 DIAGNOSIS — Z6836 Body mass index (BMI) 36.0-36.9, adult: Secondary | ICD-10-CM | POA: Diagnosis not present

## 2017-11-15 DIAGNOSIS — M25511 Pain in right shoulder: Secondary | ICD-10-CM | POA: Diagnosis not present

## 2017-11-15 DIAGNOSIS — M7541 Impingement syndrome of right shoulder: Secondary | ICD-10-CM | POA: Diagnosis not present

## 2017-11-18 ENCOUNTER — Emergency Department (HOSPITAL_COMMUNITY): Payer: Medicare Other

## 2017-11-18 ENCOUNTER — Other Ambulatory Visit: Payer: Self-pay

## 2017-11-18 ENCOUNTER — Encounter (HOSPITAL_COMMUNITY): Payer: Self-pay | Admitting: Emergency Medicine

## 2017-11-18 ENCOUNTER — Emergency Department (HOSPITAL_COMMUNITY)
Admission: EM | Admit: 2017-11-18 | Discharge: 2017-11-18 | Disposition: A | Discharge status: Home / Self Care | Payer: Medicare Other | Attending: Emergency Medicine | Admitting: Emergency Medicine

## 2017-11-18 DIAGNOSIS — E039 Hypothyroidism, unspecified: Secondary | ICD-10-CM | POA: Insufficient documentation

## 2017-11-18 DIAGNOSIS — N183 Chronic kidney disease, stage 3 (moderate): Secondary | ICD-10-CM | POA: Diagnosis not present

## 2017-11-18 DIAGNOSIS — E1122 Type 2 diabetes mellitus with diabetic chronic kidney disease: Secondary | ICD-10-CM | POA: Insufficient documentation

## 2017-11-18 DIAGNOSIS — I130 Hypertensive heart and chronic kidney disease with heart failure and stage 1 through stage 4 chronic kidney disease, or unspecified chronic kidney disease: Secondary | ICD-10-CM | POA: Insufficient documentation

## 2017-11-18 DIAGNOSIS — M545 Low back pain: Secondary | ICD-10-CM | POA: Insufficient documentation

## 2017-11-18 DIAGNOSIS — K8020 Calculus of gallbladder without cholecystitis without obstruction: Secondary | ICD-10-CM

## 2017-11-18 DIAGNOSIS — S39012A Strain of muscle, fascia and tendon of lower back, initial encounter: Secondary | ICD-10-CM | POA: Diagnosis not present

## 2017-11-18 DIAGNOSIS — Z7901 Long term (current) use of anticoagulants: Secondary | ICD-10-CM | POA: Insufficient documentation

## 2017-11-18 DIAGNOSIS — Z8546 Personal history of malignant neoplasm of prostate: Secondary | ICD-10-CM | POA: Diagnosis not present

## 2017-11-18 DIAGNOSIS — Z79899 Other long term (current) drug therapy: Secondary | ICD-10-CM | POA: Diagnosis not present

## 2017-11-18 DIAGNOSIS — I5042 Chronic combined systolic (congestive) and diastolic (congestive) heart failure: Secondary | ICD-10-CM | POA: Diagnosis not present

## 2017-11-18 LAB — URINALYSIS, ROUTINE W REFLEX MICROSCOPIC
BACTERIA UA: NONE SEEN
BILIRUBIN URINE: NEGATIVE
Hgb urine dipstick: NEGATIVE
KETONES UR: NEGATIVE mg/dL
LEUKOCYTES UA: NEGATIVE
NITRITE: NEGATIVE
PH: 5 (ref 5.0–8.0)
Protein, ur: NEGATIVE mg/dL
Specific Gravity, Urine: 1.026 (ref 1.005–1.030)

## 2017-11-18 MED ORDER — ACETAMINOPHEN 500 MG PO TABS
1000.0000 mg | ORAL_TABLET | Freq: Once | ORAL | Status: AC
  Administered 2017-11-18: 1000 mg via ORAL
  Filled 2017-11-18: qty 2

## 2017-11-18 MED ORDER — METHOCARBAMOL 500 MG PO TABS
750.0000 mg | ORAL_TABLET | Freq: Once | ORAL | Status: AC
  Administered 2017-11-18: 750 mg via ORAL
  Filled 2017-11-18: qty 2

## 2017-11-18 NOTE — ED Triage Notes (Signed)
Patient reports intermittent lower back pain/sacral pain that began last night, states that pain is better when ambulating. Pt states that pain occurs "with my heart beat" denies cp or sob. Denies urinary symptoms or abd pain.

## 2017-11-18 NOTE — ED Notes (Signed)
Patient transported to Ultrasound 

## 2017-11-18 NOTE — Discharge Instructions (Addendum)
It was our pleasure to provide your ER care today - we hope that you feel better.  The ultrasound made incidental noted of gallstones. You aorta is 3 cm - the radiologist recommends a follow up/repeat ultrasound in 2-3 years.   Take acetaminophen as need for pain. Take your muscle relaxer as need for pain.   Avoid bending at waist, or heavy lifting > 20 lbs for the next few days.  Follow up with primary care doctor in the coming week.  Return to ER if worse, new symptoms, fevers, numbness/weakness, intractable pain, other concern.

## 2017-11-18 NOTE — ED Provider Notes (Signed)
[facility] EMERGENCY DEPARTMENT Provider Note   CSN: [id number] Arrival date & time: 11/18/17  7564     History   Chief Complaint Chief Complaint  Patient presents with  . Back Pain    HPI [name] [name] is a 67 y.o. male.  Patient c/o low back pain in the past day. Dull, moderate, constant, non radiating, with episodic electrical shock/pulsing sensation. No radicular pain. No numbness or weakness. No problems with bowel or bladder function. Denies specific injury or strain. No anterior/abdominal pain. No hematuria or dysuria. No fever or chills. No hx aaa.   The history is provided by the patient.  Back Pain   Pertinent negatives include no chest pain, no fever, no numbness, no abdominal pain, no dysuria and no weakness.    Past Medical History:  Diagnosis Date  . Arthritis    OA  . Atrial flutter ([place])    a. s/p RFCA 8/13  . Cancer [facility]) 2010   prostate  . Chronic combined systolic and diastolic CHF (congestive heart failure) ([place])    a. LVEF previously 35% felt to be due tachycardia;  b. 02/2013 Echo: EF 50-55%, no rwma, mildly dil LA/RA, mild to mod MR. C 11/2014 echo - ef 60-65%, unable to determine DD  . CKD (chronic kidney disease), stage III (HCC)    hx of a/c renal failure during episode of diverticulitis 9/13 in [place], [place]  . Diabetes mellitus ([place])    TYPE 2   . Diverticulitis   . Diverticulosis   . Dyslipidemia   . Erectile dysfunction   . Gastric ulcer    s/p prior surgery  . GERD (gastroesophageal reflux disease)   . Headache   . History of colonic diverticulitis   . History of prostate cancer   . Hypertension   . Hypothyroid   . Microcytic anemia   . Obesities, morbid ([place])   . Obstructive sleep apnea    noncompliant with CPAP  . Osteoporosis   . PAF (paroxysmal atrial fibrillation) ([place])    a. failed DCCV and multiple anti-arrhythmic drugs (multaq/flecainide);   b. s/p  PVI isolation with ablation of AFib 8/13; c. repeat  PVI 01/2013;  d. 02/2013 repeat DCCV->Amio load/xarelto.  . Peptic ulcer disease 1994  . Renal calculi   . Sleep apnea 2015   has not had sleep apnea since heart intervention  . Thrombocytopenia ([place]) 1992   idopathic-treated with danazol  . Tubular adenoma of [name]     Patient Active Problem List   Diagnosis Date Noted  . Diabetic polyneuropathy associated with type 2 diabetes mellitus ([place]) 03/17/2016  . Hyperlipidemia 12/21/2015  . Chronic combined systolic and diastolic CHF (congestive heart failure) ([place])   . Foot fracture, right 10/31/2015  . S/P ablation of atrial flutter-2013 and Oct 2014 11/24/2014  . Dyslipidemia 11/24/2014  . Chronic anticoagulation-Xarelto 11/24/2014  . Type 2 diabetes mellitus with stage 3 chronic kidney disease ([place]) 11/24/2014  . Hypothyroidism 11/24/2014  . Atrial fibrillation with RVR ([place]) 11/24/2014  . AKI (acute kidney injury) ([place])   . Syncope 11/22/2014  . PAF- DCCV Nov 2014- Amiodarone 11/22/2014  . Orthostatic hypotension 11/22/2014  . Hyperkalemia 11/22/2014  . Polycythemia 11/22/2014  . Atrial flutter with rapid ventricular response ([place])   . Acute bronchitis 08/08/2013  . TIA (transient ischemic attack) 07/23/2013  . Headache 07/23/2013  . Chronic combined systolic and diastolic heart failure ([place]) 03/13/2013  . Acute on chronic combined systolic and diastolic congestive heart  failure, NYHA class 1 ([place]) 03/06/2013  . CKD (chronic kidney disease), stage III ([place]) 03/06/2013  . Chronic diastolic heart failure ([place])   . CHF exacerbation ([place]) 03/04/2013  . Flash pulmonary edema ([place]) 03/04/2013  . Atrial fibrillation ([place]) 10/24/2011  . Obstructive sleep apnea-C-pap intol 10/24/2011  . Hypertension 10/24/2011  . Obesity 10/24/2011    Past Surgical History:  Procedure Laterality Date  . ATRIAL FIBRILLATION ABLATION  12/06/11; 02/05/2013   Afib and atrial flutter ablation by Dr Rayann Heman; repeat PVI by Dr Rayann Heman 02/05/2013  . ATRIAL  FIBRILLATION ABLATION N/A 12/06/2011   Procedure: ATRIAL FIBRILLATION ABLATION;  Surgeon: Thompson Grayer, MD;  Location: [facility] CATH LAB;  Service: Cardiovascular;  Laterality: N/A;  . ATRIAL FIBRILLATION ABLATION N/A 02/05/2013   Procedure: ATRIAL FIBRILLATION ABLATION;  Surgeon: Coralyn Mark, MD;  Location: [facility] CATH LAB;  Service: Cardiovascular;  Laterality: N/A;  . benign stomach tumor removal  2000  . CARDIOVERSION  08/25/2011   Procedure: CARDIOVERSION;  Surgeon: Jettie Booze, MD;  Location: [facility];  Service: Cardiovascular;  Laterality: N/A;  . CARDIOVERSION N/A 03/05/2013   Procedure: CARDIOVERSION;  Surgeon: Sinclair Grooms, MD;  Location: [facility];  Service: Cardiovascular;  Laterality: N/A;  BEDSIDE   . convergent afib abaltion [facility] 02/26/15  02/26/15   [facility] by Dr. Lehman Prom and Dr. Danise Mina  . ELECTROPHYSIOLOGIC STUDY N/A 11/24/2014   Procedure: Cardioversion;  Surgeon: Will Meredith Leeds, MD;  Location: [facility] CV LAB;  Service: Cardiovascular;  Laterality: N/A;  . ESOPHAGOGASTRODUODENOSCOPY N/A 04/09/2013   Procedure: ESOPHAGOGASTRODUODENOSCOPY (EGD) with possible Balloon Dilation;  Surgeon: Garlan Fair, MD;  Location: [facility] ENDOSCOPY;  Service: Endoscopy;  Laterality: N/A;  . kidney stone removal     multiple  . KNEE ARTHROSCOPY Bilateral 1979  . LAPAROSCOPIC RETROPUBIC PROSTATECTOMY  02/2007   hx prostate cancer  . ORIF ANKLE FRACTURE Right 10/2015  . ORIF ANKLE FRACTURE Right 11/03/2015   Procedure: ORIF RIGHT LISFRANK FOOT;  Surgeon: Newt Minion, MD;  Location: [facility];  Service: Orthopedics;  Laterality: Right;  . TEE WITHOUT CARDIOVERSION  08/25/2011   Procedure: TRANSESOPHAGEAL ECHOCARDIOGRAM (TEE);  Surgeon: Jettie Booze, MD;  Location: [facility];  Service: Cardiovascular;  Laterality: N/A;  . TEE WITHOUT CARDIOVERSION  11/09/2011   Procedure: TRANSESOPHAGEAL ECHOCARDIOGRAM (TEE);  Surgeon: Jettie Booze, MD;  Location: [facility] ENDOSCOPY;  Service:  Cardiovascular;  Laterality: N/A;  h/p in file drawer  . TEE WITHOUT CARDIOVERSION N/A 02/05/2013   Procedure: TRANSESOPHAGEAL ECHOCARDIOGRAM (TEE);  Surgeon: Candee Furbish, MD;  Location: [facility] ENDOSCOPY;  Service: Cardiovascular;  Laterality: N/A;  . TOTAL THYROIDECTOMY  2009   benign nodules removed  . WRIST SURGERY Right 1977   with placement of lunate prosthesis        Home Medications    Prior to Admission medications   Medication Sig Start Date End Date Taking? Authorizing Provider  albuterol (PROVENTIL) (2.5 MG/3ML) 0.083% nebulizer solution Take 2.5 mg by nebulization 2 (two) times daily as needed for wheezing or shortness of breath.  05/22/13   [provider]  carvedilol (COREG) 12.5 MG tablet Take 1.5 tablets (18.75 mg total) by mouth daily. In the morning 05/02/17 07/31/17  Jettie Booze, MD  carvedilol (COREG) 12.5 MG tablet TAKE 1 TABLET BY MOUTH TWICE DAILY 07/21/17   Jettie Booze, MD  carvedilol (COREG) 25 MG tablet Take 1 tablet (25 mg total) by mouth daily. In the evening 05/02/17 07/31/17  Jettie Booze, MD  Cholecalciferol (VITAMIN D) 2000 UNITS CAPS Take 2,000 Units by mouth daily.    [provider]  diltiazem (CARDIZEM) 30 MG tablet Take 1 tablet (30 mg total) by mouth as needed (Every 4 hours AS NEEDED with a HR >706 and Systolic >237 (BP)). 10/06/29   Sherran Needs, NP  DULoxetine (CYMBALTA) 30 MG capsule Take 1 capsule by mouth every morning. 10/23/15   [provider]  esomeprazole (NEXIUM) 40 MG capsule Take 40 mg by mouth daily at 12 noon.    [provider]  ferrous sulfate 325 (65 FE) MG tablet Take 1 tablet (325 mg total) by mouth daily with breakfast. 11/24/14   Erlene Quan, PA-C  folic acid (FOLVITE) 1 MG tablet Take 1 mg by mouth daily.    [provider]  GLUCOSAMINE PO Take 1,000 mg by mouth 2 (two) times daily.     [provider]  imipramine (TOFRANIL) 25 MG tablet Take 50 mg by  mouth at bedtime.     [provider]  levothyroxine (SYNTHROID, LEVOTHROID) 150 MCG tablet Take 150 mcg by mouth daily before breakfast.    [provider]  Multiple Vitamins-Minerals (MENS 50+ MULTI VITAMIN/MIN PO) Take 1 tablet by mouth daily.    [provider]  polycarbophil (FIBERCON) 625 MG tablet Take 1,250 mg by mouth daily.    [provider]  potassium chloride SA (K-DUR,KLOR-CON) 20 MEQ tablet Take 2 tablets (40 mEq total) by mouth daily. Patient taking differently: Take 40 mEq by mouth 2 (two) times daily.  11/24/14   Erlene Quan, PA-C  pravastatin (PRAVACHOL) 20 MG tablet Take 10 mg by mouth at bedtime.     [provider]  Rivaroxaban (XARELTO) 15 MG TABS tablet Take 1 tablet (15 mg total) by mouth every evening. 05/02/17   Jettie Booze, MD    Family History Family History  Problem Relation Age of Onset  . Emphysema Mother   . Lung cancer Father   . Diabetes Brother   . Atrial fibrillation Brother   . Throat cancer Sister   . Lung cancer Sister   . [name] cancer Neg Hx     Social History Social History   Tobacco Use  . Smoking status: Never Smoker  . Smokeless tobacco: Never Used  Substance Use Topics  . Alcohol use: No  . Drug use: No     Allergies   Phenergan [promethazine hcl]; Aspirin; Bystolic [nebivolol hcl]; Calcium channel blockers; Prednisone; Promethazine; and Nsaids   Review of Systems Review of Systems  Constitutional: Negative for fever.  HENT: Negative for sore throat.   Eyes: Negative for redness.  Respiratory: Negative for shortness of breath.   Cardiovascular: Negative for chest pain.  Gastrointestinal: Negative for abdominal pain.  Genitourinary: Negative for dysuria and hematuria.  Musculoskeletal: Positive for back pain.  Skin: Negative for rash.  Neurological: Negative for weakness and numbness.  Hematological: Does not bruise/bleed easily.  Psychiatric/Behavioral: Negative for  confusion.     Physical Exam Updated Vital Signs BP 139/87 (BP Location: Right Arm)   Pulse 62   Temp 98.1 F (36.7 C) (Oral)   Resp 18   Ht 1.676 m (5\' 6" )   Wt 100.2 kg   SpO2 100%   BMI 35.67 kg/m   Physical Exam  Constitutional: He appears well-developed and well-nourished.  HENT:  Head: Atraumatic.  Eyes: Conjunctivae are normal.  Neck: Neck supple. No tracheal deviation present.  Cardiovascular: Normal rate and intact  distal pulses.  Pulmonary/Chest: Effort normal. No accessory muscle usage. No respiratory distress.  Abdominal: Soft. He exhibits no distension. There is no tenderness.  No pulsatile mass.   Genitourinary:  Genitourinary Comments: No cva tenderness  Musculoskeletal: He exhibits no edema.  TLS spine non tender, aligned. No sts.   Neurological: He is alert.  Speech normal. Motor intact bil legs, sens grossly intact. Straight leg raise neg. Steady gait.   Skin: Skin is warm and dry. No rash noted.  No skin lesions/rash to area of pain.   Psychiatric: He has a normal mood and affect.  Nursing note and vitals reviewed.    ED Treatments / Results  Labs (all labs ordered are listed, but only abnormal results are displayed) Results for orders placed or performed during the hospital encounter of 11/18/17  Urinalysis, Routine w reflex microscopic  Result Value Ref Range   Color, Urine YELLOW YELLOW   APPearance CLEAR CLEAR   Specific Gravity, Urine 1.026 1.005 - 1.030   pH 5.0 5.0 - 8.0   Glucose, UA >=500 (A) NEGATIVE mg/dL   Hgb urine dipstick NEGATIVE NEGATIVE   Bilirubin Urine NEGATIVE NEGATIVE   Ketones, ur NEGATIVE NEGATIVE mg/dL   Protein, ur NEGATIVE NEGATIVE mg/dL   Nitrite NEGATIVE NEGATIVE   Leukocytes, UA NEGATIVE NEGATIVE   RBC / HPF 0-5 0 - 5 RBC/hpf   WBC, UA 0-5 0 - 5 WBC/hpf   Bacteria, UA NONE SEEN NONE SEEN   Mucus PRESENT    US Abdomen Complete  Result Date: 11/18/2017 CLINICAL DATA:  Back pain for 2 days EXAM: ABDOMEN  ULTRASOUND COMPLETE COMPARISON:  None. FINDINGS: Gallbladder: Cholelithiasis. No pericholecystic fluid or gallbladder wall thickening. No intrahepatic or extrahepatic biliary ductal dilatation. Negative sonographic Murphy sign. Common bile duct: Diameter: 3.2 mm Liver: No focal lesion identified. Within normal limits in parenchymal echogenicity. Portal vein is patent on color Doppler imaging with normal direction of blood flow towards the liver. IVC: No abnormality visualized. Pancreas: Visualized portion unremarkable. Spleen: Size and appearance within normal limits. Right Kidney: Length: 11.8 cm. Echogenicity within normal limits. 2 x 1.9 x 1.7 cm anechoic right renal mass most consistent with a cyst. No solid mass or hydronephrosis visualized. Left Kidney: Length: 11.8 cm. Echogenicity within normal limits. 8 mm echogenic focus in the left kidney consistent with a cyst. No solid mass or hydronephrosis visualized. Abdominal aorta: Abdominal aortic aneurysm measuring 3 cm in diameter. Other findings: None. IMPRESSION: 1. Cholelithiasis without sonographic evidence of acute cholecystitis. 2. Abdominal aortic aneurysm measuring 3 cm. Abdominal Aortic Aneurysm (ICD10-I71.9). Recommend follow-up aortic ultrasound in 3 years. This recommendation follows ACR consensus guidelines: White Paper of the ACR Incidental Findings Committee II on Vascular Findings. J Am Coll Radiol 2013; 10:789-794. Electronically Signed   By: Kathreen Devoid   On: 11/18/2017 11:48    EKG None  Radiology US Abdomen Complete  Result Date: 11/18/2017 CLINICAL DATA:  Back pain for 2 days EXAM: ABDOMEN ULTRASOUND COMPLETE COMPARISON:  None. FINDINGS: Gallbladder: Cholelithiasis. No pericholecystic fluid or gallbladder wall thickening. No intrahepatic or extrahepatic biliary ductal dilatation. Negative sonographic Murphy sign. Common bile duct: Diameter: 3.2 mm Liver: No focal lesion identified. Within normal limits in parenchymal echogenicity.  Portal vein is patent on color Doppler imaging with normal direction of blood flow towards the liver. IVC: No abnormality visualized. Pancreas: Visualized portion unremarkable. Spleen: Size and appearance within normal limits. Right Kidney: Length: 11.8 cm. Echogenicity within normal limits. 2 x 1.9 x 1.7 cm  anechoic right renal mass most consistent with a cyst. No solid mass or hydronephrosis visualized. Left Kidney: Length: 11.8 cm. Echogenicity within normal limits. 8 mm echogenic focus in the left kidney consistent with a cyst. No solid mass or hydronephrosis visualized. Abdominal aorta: Abdominal aortic aneurysm measuring 3 cm in diameter. Other findings: None. IMPRESSION: 1. Cholelithiasis without sonographic evidence of acute cholecystitis. 2. Abdominal aortic aneurysm measuring 3 cm. Abdominal Aortic Aneurysm (ICD10-I71.9). Recommend follow-up aortic ultrasound in 3 years. This recommendation follows ACR consensus guidelines: White Paper of the ACR Incidental Findings Committee II on Vascular Findings. J Am Coll Radiol 2013; 10:789-794. Electronically Signed   By: Kathreen Devoid   On: 11/18/2017 11:48    Procedures Procedures (including critical care time)  Medications Ordered in ED Medications - No data to display   Initial Impression / Assessment and Plan / ED Course  I have reviewed the triage vital signs and the nursing notes.  Pertinent labs & imaging results that were available during my care of the patient were reviewed by me and considered in my medical decision making (see chart for details).  Patient notes that his pcp has requested we have ultrasound. U/s ordered.   Reviewed nursing notes and prior charts for additional history.   pcp had requested u/s - u/s without acute findings to explain symptoms - 3 cm aorta/aaa, and gallstones incidentally noted.   Symptoms appear more musculoskeletal in etiology.   Acetaminophen po. Robaxin po.  Discussed u/s w pt incl 3 cm aorta/aaa,  gallstones.   Patient comfortable appearing, nad, appear stable for d/c.     Final Clinical Impressions(s) / ED Diagnoses   Final diagnoses:  None    ED Discharge Orders    None       Lajean Saver, MD 11/18/17 1225

## 2017-11-18 NOTE — ED Notes (Signed)
Pt discharged from ED; instructions provided; Pt encouraged to return to ED if symptoms worsen and to f/u with PCP; Pt verbalized understanding of all instructions 

## 2017-11-18 NOTE — ED Notes (Signed)
Pt aware of need for urine sample, urinal provided, pt to ring call light when ready for urine to be collected

## 2017-11-18 NOTE — ED Notes (Signed)
Pt returned from US

## 2018-01-13 DIAGNOSIS — Z23 Encounter for immunization: Secondary | ICD-10-CM | POA: Diagnosis not present

## 2018-04-13 DIAGNOSIS — K5792 Diverticulitis of intestine, part unspecified, without perforation or abscess without bleeding: Secondary | ICD-10-CM | POA: Diagnosis not present

## 2018-04-13 DIAGNOSIS — D649 Anemia, unspecified: Secondary | ICD-10-CM | POA: Diagnosis not present

## 2018-04-13 DIAGNOSIS — Z6836 Body mass index (BMI) 36.0-36.9, adult: Secondary | ICD-10-CM | POA: Diagnosis not present

## 2018-04-13 DIAGNOSIS — I10 Essential (primary) hypertension: Secondary | ICD-10-CM | POA: Diagnosis not present

## 2018-04-13 DIAGNOSIS — E1129 Type 2 diabetes mellitus with other diabetic kidney complication: Secondary | ICD-10-CM | POA: Diagnosis not present

## 2018-04-24 ENCOUNTER — Encounter (INDEPENDENT_AMBULATORY_CARE_PROVIDER_SITE_OTHER): Payer: Self-pay | Admitting: Physician Assistant

## 2018-04-24 ENCOUNTER — Ambulatory Visit (INDEPENDENT_AMBULATORY_CARE_PROVIDER_SITE_OTHER): Payer: Medicare Other | Admitting: Physician Assistant

## 2018-04-24 ENCOUNTER — Ambulatory Visit (INDEPENDENT_AMBULATORY_CARE_PROVIDER_SITE_OTHER): Payer: Medicare Other

## 2018-04-24 VITALS — Ht 66.0 in | Wt 221.0 lb

## 2018-04-24 DIAGNOSIS — M25562 Pain in left knee: Secondary | ICD-10-CM

## 2018-04-24 DIAGNOSIS — M1712 Unilateral primary osteoarthritis, left knee: Secondary | ICD-10-CM | POA: Diagnosis not present

## 2018-04-24 MED ORDER — LIDOCAINE HCL 1 % IJ SOLN
5.0000 mL | INTRAMUSCULAR | Status: AC | PRN
  Administered 2018-04-24: 5 mL

## 2018-04-24 MED ORDER — METHYLPREDNISOLONE ACETATE 40 MG/ML IJ SUSP
40.0000 mg | INTRAMUSCULAR | Status: AC | PRN
  Administered 2018-04-24: 40 mg via INTRA_ARTICULAR

## 2018-04-24 NOTE — Progress Notes (Signed)
Office Visit Note   Patient: [name] [name]           Date of Birth: Mar 21, 1951           MRN: [id number] Visit Date: 04/24/2018              Requested by: Leanna Battles, [address], [place] [id number] PCP: Leanna Battles, MD  Chief Complaint  Patient presents with  . Left Knee - Pain      HPI: The patient is a 68 yo gentleman who is here with his wife for evaluation of his left knee. He reports he was walking down some steps 1 week ago, when he turned or twisted wrong on the left knee and he has been having left knee pain since this time.  He feels like the knee may have hyperextended. He has just completed a course of Cipro and wondered if this caused a "tendon injury" to the knee. He reports there has been no locking or popping of the knee, just pain over the lateral and posterior knee area since this time. He reports he has had steroid injections in both knees in the past and this has helped greatly and he would like to try a steroid injection to the left knee today. He has been wearing a knee sleeve and does feel this helped some.   Assessment & Plan: Visit Diagnoses:  1. Unilateral primary osteoarthritis, left knee   2. Left knee pain, unspecified chronicity     Plan: The left knee was injected with lidocaine and Depo Medrol under sterile techniques and the patient tolerated this well. Counseled patient to work on quadriceps strengthening exercises. He will follow up in about 4 weeks.   Follow-Up Instructions: Return in about 4 weeks (around 05/22/2018).   Ortho Exam  Patient is alert, oriented, no adenopathy, well-dressed, normal affect, normal respiratory effort. The left knee is tender to palpation over the lateral and posterior knee. There is no instability with varus and valgus stress. Negative drawers. Moderate effusion medially. Range of motion 0-110 degrees with pain on end range. No signs of infection or cellulitis.   Imaging: Xr Knee 1-2 Views  Left  Result Date: 04/24/2018 2 view of the knee knee shows moderate to severe degenerative changes with joint space narrowing, subchondral sclerosis and calcification of the meniscus. There are osteophytes present as well.   No images are attached to the encounter.  Labs: Lab Results  Component Value Date   HGBA1C 6.7 (H) 10/31/2015   HGBA1C 7.2 (H) 07/23/2013   HGBA1C 6.9 (H) 03/05/2013   ESRSEDRATE 11 01/10/2011   REPTSTATUS 07/26/2013 FINAL 07/23/2013   REPTSTATUS 07/23/2013 FINAL 07/23/2013   GRAMSTAIN  07/23/2013    NO WBC SEEN NO ORGANISMS SEEN Performed at [facility]  CYTOSPIN NO WBC SEEN NO ORGANISMS SEEN 07/23/2013   CULT  07/23/2013    NO GROWTH 3 DAYS Performed at [facility]     Lab Results  Component Value Date   ALBUMIN 3.6 10/31/2015   ALBUMIN 3.1 (L) 07/24/2013   ALBUMIN 3.8 05/06/2013    Body mass index is 35.67 kg/m.  Orders:  Orders Placed This Encounter  Procedures  . Large Joint Inj: L knee  . XR Knee 1-2 Views Left   No orders of the defined types were placed in this encounter.    Procedures: Large Joint Inj: L knee on 04/24/2018 10:02 AM Indications: pain and diagnostic evaluation Details:  22 G 1.5 in needle, anteromedial approach  Arthrogram: No  Medications: 5 mL lidocaine 1 %; 40 mg methylPREDNISolone acetate 40 MG/ML Outcome: tolerated well, no immediate complications Procedure, treatment alternatives, risks and benefits explained, specific risks discussed. Consent was given by the patient. Immediately prior to procedure a time out was called to verify the correct patient, procedure, equipment, support staff and site/side marked as required. Patient was prepped and draped in the usual sterile fashion.      Clinical Data: No additional findings.  ROS:  All other systems negative, except as noted in the HPI. Review of Systems  Objective: Vital Signs: Ht 5\' 6"  (1.676 m)   Wt 221 lb (100.2  kg)   BMI 35.67 kg/m   Specialty Comments:  No specialty comments available.  PMFS History: Patient Active Problem List   Diagnosis Date Noted  . Diabetic polyneuropathy associated with type 2 diabetes mellitus ([place]) 03/17/2016  . Hyperlipidemia 12/21/2015  . Chronic combined systolic and diastolic CHF (congestive heart failure) ([place])   . Foot fracture, right 10/31/2015  . S/P ablation of atrial flutter-2013 and Oct 2014 11/24/2014  . Dyslipidemia 11/24/2014  . Chronic anticoagulation-Xarelto 11/24/2014  . Type 2 diabetes mellitus with stage 3 chronic kidney disease ([place]) 11/24/2014  . Hypothyroidism 11/24/2014  . Atrial fibrillation with RVR ([place]) 11/24/2014  . AKI (acute kidney injury) ([place])   . Syncope 11/22/2014  . PAF- DCCV Nov 2014- Amiodarone 11/22/2014  . Orthostatic hypotension 11/22/2014  . Hyperkalemia 11/22/2014  . Polycythemia 11/22/2014  . Atrial flutter with rapid ventricular response ([place])   . Acute bronchitis 08/08/2013  . TIA (transient ischemic attack) 07/23/2013  . Headache 07/23/2013  . Chronic combined systolic and diastolic heart failure ([place]) 03/13/2013  . Acute on chronic combined systolic and diastolic congestive heart failure, NYHA class 1 ([place]) 03/06/2013  . CKD (chronic kidney disease), stage III ([place]) 03/06/2013  . Chronic diastolic heart failure ([place])   . CHF exacerbation ([place]) 03/04/2013  . Flash pulmonary edema ([place]) 03/04/2013  . Atrial fibrillation ([place]) 10/24/2011  . Obstructive sleep apnea-C-pap intol 10/24/2011  . Hypertension 10/24/2011  . Obesity 10/24/2011   Past Medical History:  Diagnosis Date  . Arthritis    OA  . Atrial flutter ([place])    a. s/p RFCA 8/13  . Cancer [facility]) 2010   prostate  . Chronic combined systolic and diastolic CHF (congestive heart failure) ([place])    a. LVEF previously 35% felt to be due tachycardia;  b. 02/2013 Echo: EF 50-55%, no rwma, mildly dil LA/RA, mild to mod MR. C 11/2014 echo - ef 60-65%, unable to  determine DD  . CKD (chronic kidney disease), stage III (HCC)    hx of a/c renal failure during episode of diverticulitis 9/13 in [place], [place]  . Diabetes mellitus ([place])    TYPE 2   . Diverticulitis   . Diverticulosis   . Dyslipidemia   . Erectile dysfunction   . Gastric ulcer    s/p prior surgery  . GERD (gastroesophageal reflux disease)   . Headache   . History of colonic diverticulitis   . History of prostate cancer   . Hypertension   . Hypothyroid   . Microcytic anemia   . Obesities, morbid ([place])   . Obstructive sleep apnea    noncompliant with CPAP  . Osteoporosis   . PAF (paroxysmal atrial fibrillation) ([place])    a. failed DCCV and multiple anti-arrhythmic drugs (multaq/flecainide);   b. s/p  PVI isolation with  ablation of AFib 8/13; c. repeat PVI 01/2013;  d. 02/2013 repeat DCCV->Amio load/xarelto.  . Peptic ulcer disease 1994  . Renal calculi   . Sleep apnea 2015   has not had sleep apnea since heart intervention  . Thrombocytopenia ([place]) 1992   idopathic-treated with danazol  . Tubular adenoma of colon     Family History  Problem Relation Age of Onset  . Emphysema Mother   . Lung cancer Father   . Diabetes Brother   . Atrial fibrillation Brother   . Throat cancer Sister   . Lung cancer Sister   . Colon cancer Neg Hx     Past Surgical History:  Procedure Laterality Date  . ATRIAL FIBRILLATION ABLATION  12/06/11; 02/05/2013   Afib and atrial flutter ablation by Dr Rayann Heman; repeat PVI by Dr Rayann Heman 02/05/2013  . ATRIAL FIBRILLATION ABLATION N/A 12/06/2011   Procedure: ATRIAL FIBRILLATION ABLATION;  Surgeon: Thompson Grayer, MD;  Location: [facility] CATH LAB;  Service: Cardiovascular;  Laterality: N/A;  . ATRIAL FIBRILLATION ABLATION N/A 02/05/2013   Procedure: ATRIAL FIBRILLATION ABLATION;  Surgeon: Coralyn Mark, MD;  Location: [place] CATH LAB;  Service: Cardiovascular;  Laterality: N/A;  . benign stomach tumor removal  2000  . CARDIOVERSION  08/25/2011   Procedure:  CARDIOVERSION;  Surgeon: Jettie Booze, MD;  Location: [place];  Service: Cardiovascular;  Laterality: N/A;  . CARDIOVERSION N/A 03/05/2013   Procedure: CARDIOVERSION;  Surgeon: Sinclair Grooms, MD;  Location: [place] [facility];  Service: Cardiovascular;  Laterality: N/A;  BEDSIDE   . convergent afib abaltion [facility] 02/26/15  02/26/15   [facility] by Dr. Lehman Prom and Dr. Danise Mina  . ELECTROPHYSIOLOGIC STUDY N/A 11/24/2014   Procedure: Cardioversion;  Surgeon: Will Meredith Leeds, MD;  Location: [place] CV LAB;  Service: Cardiovascular;  Laterality: N/A;  . ESOPHAGOGASTRODUODENOSCOPY N/A 04/09/2013   Procedure: ESOPHAGOGASTRODUODENOSCOPY (EGD) with possible Balloon Dilation;  Surgeon: Garlan Fair, MD;  Location: [facility] ENDOSCOPY;  Service: Endoscopy;  Laterality: N/A;  . kidney stone removal     multiple  . KNEE ARTHROSCOPY Bilateral 1979  . LAPAROSCOPIC RETROPUBIC PROSTATECTOMY  02/2007   hx prostate cancer  . ORIF ANKLE FRACTURE Right 10/2015  . ORIF ANKLE FRACTURE Right 11/03/2015   Procedure: ORIF RIGHT LISFRANK FOOT;  Surgeon: Newt Minion, MD;  Location: [place];  Service: Orthopedics;  Laterality: Right;  . TEE WITHOUT CARDIOVERSION  08/25/2011   Procedure: TRANSESOPHAGEAL ECHOCARDIOGRAM (TEE);  Surgeon: Jettie Booze, MD;  Location: [place];  Service: Cardiovascular;  Laterality: N/A;  . TEE WITHOUT CARDIOVERSION  11/09/2011   Procedure: TRANSESOPHAGEAL ECHOCARDIOGRAM (TEE);  Surgeon: Jettie Booze, MD;  Location: [facility] ENDOSCOPY;  Service: Cardiovascular;  Laterality: N/A;  h/p in file drawer  . TEE WITHOUT CARDIOVERSION N/A 02/05/2013   Procedure: TRANSESOPHAGEAL ECHOCARDIOGRAM (TEE);  Surgeon: Candee Furbish, MD;  Location: [facility] ENDOSCOPY;  Service: Cardiovascular;  Laterality: N/A;  . TOTAL THYROIDECTOMY  2009   benign nodules removed  . WRIST SURGERY Right 1977   with placement of lunate prosthesis   Social History   Occupational History  . Not on file  Tobacco Use  .  Smoking status: Never Smoker  . Smokeless tobacco: Never Used  Substance and Sexual Activity  . Alcohol use: No  . Drug use: No  . Sexual activity: Yes

## 2018-04-30 DIAGNOSIS — Z6836 Body mass index (BMI) 36.0-36.9, adult: Secondary | ICD-10-CM | POA: Diagnosis not present

## 2018-04-30 DIAGNOSIS — I10 Essential (primary) hypertension: Secondary | ICD-10-CM | POA: Diagnosis not present

## 2018-04-30 DIAGNOSIS — E1129 Type 2 diabetes mellitus with other diabetic kidney complication: Secondary | ICD-10-CM | POA: Diagnosis not present

## 2018-04-30 DIAGNOSIS — M25562 Pain in left knee: Secondary | ICD-10-CM | POA: Diagnosis not present

## 2018-04-30 DIAGNOSIS — I480 Paroxysmal atrial fibrillation: Secondary | ICD-10-CM | POA: Diagnosis not present

## 2018-05-22 ENCOUNTER — Ambulatory Visit (INDEPENDENT_AMBULATORY_CARE_PROVIDER_SITE_OTHER): Payer: Medicare Other | Admitting: Physician Assistant

## 2018-06-05 DIAGNOSIS — Z6836 Body mass index (BMI) 36.0-36.9, adult: Secondary | ICD-10-CM | POA: Diagnosis not present

## 2018-06-05 DIAGNOSIS — G4733 Obstructive sleep apnea (adult) (pediatric): Secondary | ICD-10-CM | POA: Diagnosis not present

## 2018-06-05 DIAGNOSIS — I10 Essential (primary) hypertension: Secondary | ICD-10-CM | POA: Diagnosis not present

## 2018-06-05 DIAGNOSIS — I480 Paroxysmal atrial fibrillation: Secondary | ICD-10-CM | POA: Diagnosis not present

## 2018-06-05 DIAGNOSIS — Z1331 Encounter for screening for depression: Secondary | ICD-10-CM | POA: Diagnosis not present

## 2018-06-05 DIAGNOSIS — I5042 Chronic combined systolic (congestive) and diastolic (congestive) heart failure: Secondary | ICD-10-CM | POA: Diagnosis not present

## 2018-06-05 DIAGNOSIS — E1129 Type 2 diabetes mellitus with other diabetic kidney complication: Secondary | ICD-10-CM | POA: Diagnosis not present

## 2018-06-29 DIAGNOSIS — I10 Essential (primary) hypertension: Secondary | ICD-10-CM | POA: Diagnosis not present

## 2018-07-02 DIAGNOSIS — K1370 Unspecified lesions of oral mucosa: Secondary | ICD-10-CM | POA: Diagnosis not present

## 2018-07-03 ENCOUNTER — Other Ambulatory Visit: Payer: Self-pay | Admitting: Interventional Cardiology

## 2018-07-04 MED ORDER — RIVAROXABAN 20 MG PO TABS: 20.0000 mg | ORAL_TABLET | Freq: Every day | ORAL | 1 refills | Status: DC

## 2018-07-04 NOTE — Telephone Encounter (Signed)
Age 68, weight 100kg, SCr 1.4 at [facility] 04/13/18. CrCl 9mL/min, pt should be on Xarelto 20mg  daily instead of 15mg  daily since CrCl > 50. Last OV 04/2017, pt overdue for annual follow up however due to [place] will not send reminder message as non-urgent office visits are being canceled currently. Called pt to make him aware of dose change.

## 2018-08-13 DIAGNOSIS — N182 Chronic kidney disease, stage 2 (mild): Secondary | ICD-10-CM | POA: Diagnosis not present

## 2018-08-13 DIAGNOSIS — I129 Hypertensive chronic kidney disease with stage 1 through stage 4 chronic kidney disease, or unspecified chronic kidney disease: Secondary | ICD-10-CM | POA: Diagnosis not present

## 2018-08-13 DIAGNOSIS — L259 Unspecified contact dermatitis, unspecified cause: Secondary | ICD-10-CM | POA: Diagnosis not present

## 2018-08-13 DIAGNOSIS — E1129 Type 2 diabetes mellitus with other diabetic kidney complication: Secondary | ICD-10-CM | POA: Diagnosis not present

## 2018-10-01 ENCOUNTER — Encounter (INDEPENDENT_AMBULATORY_CARE_PROVIDER_SITE_OTHER): Payer: Medicare Other | Admitting: Ophthalmology

## 2018-11-08 DIAGNOSIS — E782 Mixed hyperlipidemia: Secondary | ICD-10-CM | POA: Diagnosis not present

## 2018-11-08 DIAGNOSIS — E1129 Type 2 diabetes mellitus with other diabetic kidney complication: Secondary | ICD-10-CM | POA: Diagnosis not present

## 2018-11-08 DIAGNOSIS — Z125 Encounter for screening for malignant neoplasm of prostate: Secondary | ICD-10-CM | POA: Diagnosis not present

## 2018-11-14 DIAGNOSIS — R82998 Other abnormal findings in urine: Secondary | ICD-10-CM | POA: Diagnosis not present

## 2018-11-14 DIAGNOSIS — I129 Hypertensive chronic kidney disease with stage 1 through stage 4 chronic kidney disease, or unspecified chronic kidney disease: Secondary | ICD-10-CM | POA: Diagnosis not present

## 2018-11-15 DIAGNOSIS — G4733 Obstructive sleep apnea (adult) (pediatric): Secondary | ICD-10-CM | POA: Diagnosis not present

## 2018-11-15 DIAGNOSIS — I480 Paroxysmal atrial fibrillation: Secondary | ICD-10-CM | POA: Diagnosis not present

## 2018-11-15 DIAGNOSIS — I5042 Chronic combined systolic (congestive) and diastolic (congestive) heart failure: Secondary | ICD-10-CM | POA: Diagnosis not present

## 2018-11-15 DIAGNOSIS — E1129 Type 2 diabetes mellitus with other diabetic kidney complication: Secondary | ICD-10-CM | POA: Diagnosis not present

## 2018-11-15 DIAGNOSIS — E1121 Type 2 diabetes mellitus with diabetic nephropathy: Secondary | ICD-10-CM | POA: Diagnosis not present

## 2018-11-15 DIAGNOSIS — Z1339 Encounter for screening examination for other mental health and behavioral disorders: Secondary | ICD-10-CM | POA: Diagnosis not present

## 2018-11-15 DIAGNOSIS — D6949 Other primary thrombocytopenia: Secondary | ICD-10-CM | POA: Diagnosis not present

## 2018-11-15 DIAGNOSIS — E782 Mixed hyperlipidemia: Secondary | ICD-10-CM | POA: Diagnosis not present

## 2018-11-15 DIAGNOSIS — E890 Postprocedural hypothyroidism: Secondary | ICD-10-CM | POA: Diagnosis not present

## 2018-11-15 DIAGNOSIS — Z0000 Encounter for general adult medical examination without abnormal findings: Secondary | ICD-10-CM | POA: Diagnosis not present

## 2018-11-15 DIAGNOSIS — Z1331 Encounter for screening for depression: Secondary | ICD-10-CM | POA: Diagnosis not present

## 2018-11-15 DIAGNOSIS — I110 Hypertensive heart disease with heart failure: Secondary | ICD-10-CM | POA: Diagnosis not present

## 2018-12-07 DIAGNOSIS — H0102A Squamous blepharitis right eye, upper and lower eyelids: Secondary | ICD-10-CM | POA: Diagnosis not present

## 2018-12-07 DIAGNOSIS — H0288B Meibomian gland dysfunction left eye, upper and lower eyelids: Secondary | ICD-10-CM | POA: Diagnosis not present

## 2018-12-07 DIAGNOSIS — H0102B Squamous blepharitis left eye, upper and lower eyelids: Secondary | ICD-10-CM | POA: Diagnosis not present

## 2018-12-07 DIAGNOSIS — H0288A Meibomian gland dysfunction right eye, upper and lower eyelids: Secondary | ICD-10-CM | POA: Diagnosis not present

## 2018-12-07 DIAGNOSIS — H2513 Age-related nuclear cataract, bilateral: Secondary | ICD-10-CM | POA: Diagnosis not present

## 2018-12-10 ENCOUNTER — Encounter (INDEPENDENT_AMBULATORY_CARE_PROVIDER_SITE_OTHER): Payer: Medicare Other | Admitting: Ophthalmology

## 2018-12-14 ENCOUNTER — Telehealth: Payer: Self-pay | Admitting: Interventional Cardiology

## 2018-12-14 NOTE — Telephone Encounter (Signed)
Called and spoke to patient's wife (DPR on file). Made her aware that he should continue Xarelto 20 mg QD. Patient was a prn f/u with Dr. Irish Lack but since we are continuing Xarelto per Dr. Jackalyn Lombard recommendation we will schedule a follow up appointment for the next available in a few months, not urgent. Wife states that she will call for an appt when she gets back home and they will let us know if he needs anything before then. Denies needing refills at this time. Recall has been placed.

## 2018-12-14 NOTE — Telephone Encounter (Signed)
He should stay on the 20mg  daily. Actual body weight creatine clearance is 56 ml/min based off of a scr of 1.5.

## 2018-12-14 NOTE — Telephone Encounter (Signed)
New message   Pt c/o medication issue:  1. Name of Medication: rivaroxaban (XARELTO) 20 MG TABS tablet  2. How are you currently taking this medication (dosage and times per day)? 1 time daily   3. Are you having a reaction (difficulty breathing--STAT)? n/a  4. What is your medication issue? Patient's wife wants to know if he should keep taking the 20 mg tablet or go back to the 15 mg? Please call to discuss.

## 2018-12-23 ENCOUNTER — Other Ambulatory Visit: Payer: Self-pay | Admitting: Interventional Cardiology

## 2018-12-24 NOTE — Telephone Encounter (Signed)
Xarelto 20mg  refill request received; pt is 68 years old, weight-100.2kg, Crea-1.50 on 11/08/2018 via [facility] at [facility], last seen by Dr. Irish Lack on 05/02/17 and recall set for 02/12/2019 and a note from 12/14/2018 in the chart states the appt is not urgent, Diagnosis-Afib, CrCl- 66.73ml/min; Dose is appropriate based on dosing criteria. Will send in refill to requested pharmacy.

## 2019-01-08 DIAGNOSIS — Z23 Encounter for immunization: Secondary | ICD-10-CM | POA: Diagnosis not present

## 2019-03-14 DIAGNOSIS — E1129 Type 2 diabetes mellitus with other diabetic kidney complication: Secondary | ICD-10-CM | POA: Diagnosis not present

## 2019-03-14 DIAGNOSIS — I5042 Chronic combined systolic (congestive) and diastolic (congestive) heart failure: Secondary | ICD-10-CM | POA: Diagnosis not present

## 2019-03-14 DIAGNOSIS — G4733 Obstructive sleep apnea (adult) (pediatric): Secondary | ICD-10-CM | POA: Diagnosis not present

## 2019-03-14 DIAGNOSIS — I480 Paroxysmal atrial fibrillation: Secondary | ICD-10-CM | POA: Diagnosis not present

## 2019-03-14 DIAGNOSIS — E890 Postprocedural hypothyroidism: Secondary | ICD-10-CM | POA: Diagnosis not present

## 2019-03-14 DIAGNOSIS — N200 Calculus of kidney: Secondary | ICD-10-CM | POA: Diagnosis not present

## 2019-03-15 ENCOUNTER — Telehealth: Payer: Self-pay | Admitting: Internal Medicine

## 2019-03-15 MED ORDER — AMOXICILLIN-POT CLAVULANATE 875-125 MG PO TABS: 1.0000 | ORAL_TABLET | Freq: Two times a day (BID) | ORAL | 0 refills | Status: DC

## 2019-03-15 NOTE — Telephone Encounter (Signed)
Spoke with pts wife and she is aware. Script sent to pharmacy. Pt scheduled to see Dr. Hilarie Fredrickson 03/26/19@10 :10am. Wife aware of appt.

## 2019-03-15 NOTE — Telephone Encounter (Signed)
Chart reviewed  OK to Rx Augmentin 875 mg bid x 10 days to treat suspected diverticulitis  F/U visit Dr. Hilarie Fredrickson or an APP appropriate also

## 2019-03-15 NOTE — Telephone Encounter (Signed)
Left message for pt to call back.  Pts wife calling back stating that last night pt started having LLQ abd pain and his abdomen is sore to touch, he reports no fever. Wife states he has had diverticulitis 4-5 times in the past and these symptoms are the ones he has when he has a flare. Requesting antibiotics be called in and then want to schedule appt with Dr Hilarie Fredrickson. Dr. Carlean Purl as DOD please advise.

## 2019-03-18 ENCOUNTER — Other Ambulatory Visit (INDEPENDENT_AMBULATORY_CARE_PROVIDER_SITE_OTHER): Payer: Medicare Other

## 2019-03-18 ENCOUNTER — Other Ambulatory Visit: Payer: Self-pay

## 2019-03-18 ENCOUNTER — Telehealth: Payer: Self-pay | Admitting: Internal Medicine

## 2019-03-18 DIAGNOSIS — R1032 Left lower quadrant pain: Secondary | ICD-10-CM | POA: Diagnosis not present

## 2019-03-18 LAB — COMPREHENSIVE METABOLIC PANEL
ALT: 15 U/L (ref 0–53)
AST: 12 U/L (ref 0–37)
Albumin: 4.2 g/dL (ref 3.5–5.2)
Alkaline Phosphatase: 96 U/L (ref 39–117)
BUN: 20 mg/dL (ref 6–23)
CO2: 24 mEq/L (ref 19–32)
Calcium: 9.5 mg/dL (ref 8.4–10.5)
Chloride: 107 mEq/L (ref 96–112)
Creatinine, Ser: 1.17 mg/dL (ref 0.40–1.50)
GFR: 61.85 mL/min (ref >60.00)
Glucose, Bld: 144 mg/dL — ABNORMAL HIGH (ref 70–99)
Potassium: 4.7 mEq/L (ref 3.5–5.1)
Sodium: 140 mEq/L (ref 135–145)
Total Bilirubin: 0.4 mg/dL (ref 0.2–1.2)
Total Protein: 7.1 g/dL (ref 6.0–8.3)

## 2019-03-18 LAB — CBC WITH DIFFERENTIAL/PLATELET
Basophils Absolute: 0 10*3/uL (ref 0.0–0.1)
Basophils Relative: 0.2 % (ref 0.0–3.0)
Eosinophils Absolute: 0.1 10*3/uL (ref 0.0–0.7)
Eosinophils Relative: 0.9 % (ref 0.0–5.0)
HCT: 47.2 % (ref 39.0–52.0)
Hemoglobin: 15.3 g/dL (ref 13.0–17.0)
Lymphocytes Relative: 15.7 % (ref 12.0–46.0)
Lymphs Abs: 1.5 10*3/uL (ref 0.7–4.0)
MCHC: 32.4 g/dL (ref 30.0–36.0)
MCV: 88.2 fl (ref 78.0–100.0)
Monocytes Absolute: 0.8 10*3/uL (ref 0.1–1.0)
Monocytes Relative: 7.8 % (ref 3.0–12.0)
Neutro Abs: 7.4 10*3/uL (ref 1.4–7.7)
Neutrophils Relative %: 75.4 % (ref 43.0–77.0)
Platelets: 197 10*3/uL (ref 150.0–400.0)
RBC: 5.35 Mil/uL (ref 4.22–5.81)
RDW: 15.7 % — ABNORMAL HIGH (ref 11.5–15.5)
WBC: 9.8 10*3/uL (ref 4.0–10.5)

## 2019-03-18 NOTE — Telephone Encounter (Signed)
Pt called with diverticulitis flare on Friday and was prescribed Augmentin. Pts wife states over the weekend he felt better but today he is worse. States he usually is prescribed cipro and flagyl, she wasn't sure if he needed to be switched to different meds or what. Pt is scheduled for a follow-up visit next week. Please advise.

## 2019-03-18 NOTE — Telephone Encounter (Signed)
Pt to come for labs, orders in epic. Pt scheduled for CT of A/P at [facility] 03/19/19@3pm . Pt to be NPO after 11am, to drink bottle 1 of contrast at 1pm, bottle 2 at 2pm. Pt to pick up contrast prior to scan. Pts wife aware of appts and instructions.

## 2019-03-18 NOTE — Telephone Encounter (Signed)
Would expect Augmentin to provider similar coverage for diverticulitis compared to Cipro/flagyl If worse, please have him come for CBC, CMP and order urgent CT abd/pelvis with contrast [place] diet with focus on fluids

## 2019-03-19 ENCOUNTER — Ambulatory Visit (HOSPITAL_COMMUNITY)
Admission: RE | Admit: 2019-03-19 | Discharge: 2019-03-19 | Disposition: A | Discharge status: Home / Self Care | Payer: Medicare Other | Source: Ambulatory Visit | Attending: Internal Medicine | Admitting: Internal Medicine

## 2019-03-19 ENCOUNTER — Other Ambulatory Visit: Payer: Self-pay

## 2019-03-19 DIAGNOSIS — N200 Calculus of kidney: Secondary | ICD-10-CM | POA: Diagnosis not present

## 2019-03-19 DIAGNOSIS — R1032 Left lower quadrant pain: Secondary | ICD-10-CM | POA: Diagnosis not present

## 2019-03-19 MED ORDER — SODIUM CHLORIDE (PF) 0.9 % IJ SOLN
INTRAMUSCULAR | Status: AC
  Filled 2019-03-19: qty 50

## 2019-03-19 MED ORDER — IOHEXOL 300 MG/ML  SOLN
100.0000 mL | Freq: Once | INTRAMUSCULAR | Status: AC | PRN
  Administered 2019-03-19: 16:00:00 100 mL via INTRAVENOUS

## 2019-03-26 ENCOUNTER — Ambulatory Visit: Payer: Medicare Other | Admitting: Internal Medicine

## 2019-05-07 DIAGNOSIS — E1129 Type 2 diabetes mellitus with other diabetic kidney complication: Secondary | ICD-10-CM | POA: Diagnosis not present

## 2019-05-07 DIAGNOSIS — E890 Postprocedural hypothyroidism: Secondary | ICD-10-CM | POA: Diagnosis not present

## 2019-05-20 ENCOUNTER — Ambulatory Visit: Payer: Medicare Other

## 2019-05-21 ENCOUNTER — Ambulatory Visit: Payer: Medicare Other

## 2019-06-16 ENCOUNTER — Other Ambulatory Visit: Payer: Self-pay | Admitting: Interventional Cardiology

## 2019-06-17 NOTE — Telephone Encounter (Signed)
Called and spoke to pt and made him aware that he was overdue for an appointment and once he had an appointment scheduled I could send in for a refill. Appointment scheduled for 07/23/2019. Prescription refill sent.

## 2019-06-17 NOTE — Telephone Encounter (Signed)
Prescription refill request for Xarelto received.   Last office visit:[place], 05/02/2017 Weight: 100.2 kg Age: 69 y.o. Scr: 1.17, 03/18/2019 CrCl: 84.45 ml/min  Pt over due for an office visit.

## 2019-07-01 DIAGNOSIS — H0102B Squamous blepharitis left eye, upper and lower eyelids: Secondary | ICD-10-CM | POA: Diagnosis not present

## 2019-07-01 DIAGNOSIS — E119 Type 2 diabetes mellitus without complications: Secondary | ICD-10-CM | POA: Diagnosis not present

## 2019-07-01 DIAGNOSIS — H43812 Vitreous degeneration, left eye: Secondary | ICD-10-CM | POA: Diagnosis not present

## 2019-07-01 DIAGNOSIS — H0288A Meibomian gland dysfunction right eye, upper and lower eyelids: Secondary | ICD-10-CM | POA: Diagnosis not present

## 2019-07-01 DIAGNOSIS — H2513 Age-related nuclear cataract, bilateral: Secondary | ICD-10-CM | POA: Diagnosis not present

## 2019-07-01 DIAGNOSIS — H0288B Meibomian gland dysfunction left eye, upper and lower eyelids: Secondary | ICD-10-CM | POA: Diagnosis not present

## 2019-07-01 DIAGNOSIS — H0102A Squamous blepharitis right eye, upper and lower eyelids: Secondary | ICD-10-CM | POA: Diagnosis not present

## 2019-07-15 DIAGNOSIS — G4733 Obstructive sleep apnea (adult) (pediatric): Secondary | ICD-10-CM | POA: Diagnosis not present

## 2019-07-15 DIAGNOSIS — N200 Calculus of kidney: Secondary | ICD-10-CM | POA: Diagnosis not present

## 2019-07-15 DIAGNOSIS — E890 Postprocedural hypothyroidism: Secondary | ICD-10-CM | POA: Diagnosis not present

## 2019-07-15 DIAGNOSIS — I480 Paroxysmal atrial fibrillation: Secondary | ICD-10-CM | POA: Diagnosis not present

## 2019-07-15 DIAGNOSIS — I5042 Chronic combined systolic (congestive) and diastolic (congestive) heart failure: Secondary | ICD-10-CM | POA: Diagnosis not present

## 2019-07-15 DIAGNOSIS — D6949 Other primary thrombocytopenia: Secondary | ICD-10-CM | POA: Diagnosis not present

## 2019-07-15 DIAGNOSIS — M19031 Primary osteoarthritis, right wrist: Secondary | ICD-10-CM | POA: Diagnosis not present

## 2019-07-15 DIAGNOSIS — E782 Mixed hyperlipidemia: Secondary | ICD-10-CM | POA: Diagnosis not present

## 2019-07-15 DIAGNOSIS — Z1331 Encounter for screening for depression: Secondary | ICD-10-CM | POA: Diagnosis not present

## 2019-07-15 DIAGNOSIS — E1129 Type 2 diabetes mellitus with other diabetic kidney complication: Secondary | ICD-10-CM | POA: Diagnosis not present

## 2019-07-21 NOTE — Progress Notes (Signed)
Cardiology Office Note   Date:  07/23/2019   ID:  [name] [name], [name] [name] 1950-09-27, MRN [id number]  PCP:  Leanna Battles, MD    No chief complaint on file.  PAF  Wt Readings from Last 3 Encounters:  07/23/19 221 lb (100.2 kg)  04/24/18 221 lb (100.2 kg)  11/18/17 221 lb (100.2 kg)       History of Present Illness: [name] [name] is a 69 y.o. male  with a h/o pesisitent afib s/p 2 ablations andin11/16underwent a convergent afib ablation with Dr. Lehman Prom and Dr. Danise Mina at [facility]. He had some complications with cardiogenic shock per pt and had to be intubated and spend 4 days in intensive care.  It was recommended that he stop his Multaq. He wanted to stop Xarelto. After discussion with Dr. Rayann Heman in 2019, it was recommended that he continue Xarelto.  He was very symptomatic in the past when he had AFib.  Since the last visit, he has done well.  Denies : Chest pain. Dizziness. Leg edema. Nitroglycerin use. Orthopnea. Palpitations. Paroxysmal nocturnal dyspnea. Shortness of breath. Syncope.   No use of prn dilt.  Walks 3-5 miles daily.  Wife has RA, takes immunosuppression.  They have been careful during COVID.    Eating at home mostly.     Past Medical History:  Diagnosis Date  . Arthritis    OA  . Atrial flutter ([place])    a. s/p RFCA 8/13  . Cancer [facility]) 2010   prostate  . Chronic combined systolic and diastolic CHF (congestive heart failure) ([place])    a. LVEF previously 35% felt to be due tachycardia;  b. 02/2013 Echo: EF 50-55%, no rwma, mildly dil LA/RA, mild to mod MR. C 11/2014 echo - ef 60-65%, unable to determine DD  . CKD (chronic kidney disease), stage III    hx of a/c renal failure during episode of diverticulitis 9/13 in [place], [place]  . Diabetes mellitus ([place])    TYPE 2   . Diverticulitis   . Diverticulosis   . Dyslipidemia   . Erectile dysfunction   . Gastric ulcer    s/p prior surgery  . GERD (gastroesophageal reflux disease)   .  Headache   . History of colonic diverticulitis   . History of prostate cancer   . Hypertension   . Hypothyroid   . Microcytic anemia   . Obesities, morbid ([place])   . Obstructive sleep apnea    noncompliant with CPAP  . Osteoporosis   . PAF (paroxysmal atrial fibrillation) ([place])    a. failed DCCV and multiple anti-arrhythmic drugs (multaq/flecainide);   b. s/p  PVI isolation with ablation of AFib 8/13; c. repeat PVI 01/2013;  d. 02/2013 repeat DCCV->Amio load/xarelto.  . Peptic ulcer disease 1994  . Renal calculi   . Sleep apnea 2015   has not had sleep apnea since heart intervention  . Thrombocytopenia ([place]) 1992   idopathic-treated with danazol  . Tubular adenoma of colon     Past Surgical History:  Procedure Laterality Date  . ATRIAL FIBRILLATION ABLATION  12/06/11; 02/05/2013   Afib and atrial flutter ablation by Dr Rayann Heman; repeat PVI by Dr Rayann Heman 02/05/2013  . ATRIAL FIBRILLATION ABLATION N/A 12/06/2011   Procedure: ATRIAL FIBRILLATION ABLATION;  Surgeon: Thompson Grayer, MD;  [facility] CATH LAB;  Service: Cardiovascular;  Laterality: N/A;  . ATRIAL FIBRILLATION ABLATION N/A 02/05/2013   Procedure: ATRIAL FIBRILLATION ABLATION;  Surgeon: Coralyn Mark, MD;  Location: [facility] CATH  LAB;  Service: Cardiovascular;  Laterality: N/A;  . benign stomach tumor removal  2000  . CARDIOVERSION  08/25/2011   Procedure: CARDIOVERSION;  Surgeon: Jettie Booze, MD;  Location: [place];  Service: Cardiovascular;  Laterality: N/A;  . CARDIOVERSION N/A 03/05/2013   Procedure: CARDIOVERSION;  Surgeon: Sinclair Grooms, MD;  Location: [place];  Service: Cardiovascular;  Laterality: N/A;  BEDSIDE   . convergent afib abaltion [facility] 02/26/15  02/26/15   [facility] by Dr. Lehman Prom and Dr. Danise Mina  . ELECTROPHYSIOLOGIC STUDY N/A 11/24/2014   Procedure: Cardioversion;  Surgeon: Will Meredith Leeds, MD;  Location: [place] CV LAB;  Service: Cardiovascular;  Laterality: N/A;  . ESOPHAGOGASTRODUODENOSCOPY  N/A 04/09/2013   Procedure: ESOPHAGOGASTRODUODENOSCOPY (EGD) with possible Balloon Dilation;  Surgeon: Garlan Fair, MD;  Location: [facility] ENDOSCOPY;  Service: Endoscopy;  Laterality: N/A;  . kidney stone removal     multiple  . KNEE ARTHROSCOPY Bilateral 1979  . LAPAROSCOPIC RETROPUBIC PROSTATECTOMY  02/2007   hx prostate cancer  . ORIF ANKLE FRACTURE Right 10/2015  . ORIF ANKLE FRACTURE Right 11/03/2015   Procedure: ORIF RIGHT LISFRANK FOOT;  Surgeon: Newt Minion, MD;  Location: [place];  Service: Orthopedics;  Laterality: Right;  . TEE WITHOUT CARDIOVERSION  08/25/2011   Procedure: TRANSESOPHAGEAL ECHOCARDIOGRAM (TEE);  Surgeon: Jettie Booze, MD;  Location: [place];  Service: Cardiovascular;  Laterality: N/A;  . TEE WITHOUT CARDIOVERSION  11/09/2011   Procedure: TRANSESOPHAGEAL ECHOCARDIOGRAM (TEE);  Surgeon: Jettie Booze, MD;  Location: [facility] ENDOSCOPY;  Service: Cardiovascular;  Laterality: N/A;  h/p in file drawer  . TEE WITHOUT CARDIOVERSION N/A 02/05/2013   Procedure: TRANSESOPHAGEAL ECHOCARDIOGRAM (TEE);  Surgeon: Candee Furbish, MD;  Location: [facility] ENDOSCOPY;  Service: Cardiovascular;  Laterality: N/A;  . TOTAL THYROIDECTOMY  2009   benign nodules removed  . WRIST SURGERY Right 1977   with placement of lunate prosthesis     Current Outpatient Medications  Medication Sig Dispense Refill  . albuterol (PROVENTIL) (2.5 MG/3ML) 0.083% nebulizer solution Take 2.5 mg by nebulization 2 (two) times daily as needed for wheezing or shortness of breath.     Marland Kitchen amoxicillin-clavulanate (AUGMENTIN) 875-125 MG tablet Take 1 tablet by mouth 2 (two) times daily. 20 tablet 0  . carvedilol (COREG) 25 MG tablet Take 1 tablet (25 mg total) by mouth daily. In the evening (Patient taking differently: Take 25 mg by mouth 2 (two) times daily with a meal. In the evening) 90 tablet 3  . Cholecalciferol (VITAMIN D) 2000 UNITS CAPS Take 2,000 Units by mouth daily.    Marland Kitchen diltiazem (CARDIZEM) 30 MG  tablet Take 1 tablet (30 mg total) by mouth as needed (Every 4 hours AS NEEDED with a HR 123XX123 and Systolic 123XX123 (BP)). 30 tablet 3  . DULoxetine (CYMBALTA) 30 MG capsule Take 1 capsule by mouth every morning.    Marland Kitchen esomeprazole (NEXIUM) 40 MG capsule Take 40 mg by mouth daily at 12 noon.    . ferrous sulfate 325 (65 FE) MG tablet Take 1 tablet (325 mg total) by mouth daily with breakfast. 90 tablet 1  . folic acid (FOLVITE) 1 MG tablet Take 1 mg by mouth daily.    Marland Kitchen GLUCOSAMINE PO Take 1,000 mg by mouth 2 (two) times daily.     Marland Kitchen imipramine (TOFRANIL) 25 MG tablet Take 50 mg by mouth at bedtime.     Marland Kitchen JARDIANCE 25 MG TABS tablet Take 25 mg by mouth daily.    Marland Kitchen levothyroxine (  SYNTHROID) 175 MCG tablet Take 175 mcg by mouth daily.    Marland Kitchen losartan (COZAAR) 50 MG tablet Take 50 mg by mouth daily.    . metFORMIN (GLUCOPHAGE-XR) 500 MG 24 hr tablet Take 1,000 mg by mouth 2 (two) times daily.    . Multiple Vitamins-Minerals (MENS 50+ MULTI VITAMIN/MIN PO) Take 1 tablet by mouth daily.    . polycarbophil (FIBERCON) 625 MG tablet Take 1,250 mg by mouth daily.    . potassium chloride SA (K-DUR,KLOR-CON) 20 MEQ tablet Take 2 tablets (40 mEq total) by mouth daily. (Patient taking differently: Take 40 mEq by mouth 4 (four) times daily. ) 180 tablet 1  . pravastatin (PRAVACHOL) 20 MG tablet Take 40 mg by mouth at bedtime.     . tamsulosin (FLOMAX) 0.4 MG CAPS capsule Take 0.4 mg by mouth daily as needed.    Alveda Reasons 20 MG TABS tablet TAKE 1 TABLET DAILY WITH   SUPPER (DOSE INCREASE) 90 tablet 0   Current Facility-Administered Medications  Medication Dose Route Frequency Provider Last Rate Last Admin  . 0.9 %  sodium chloride infusion  500 mL Intravenous Once Pyrtle, Lajuan Lines, MD        Allergies:   Phenergan [promethazine hcl], Aspirin, Bystolic [nebivolol hcl], Calcium channel blockers, Prednisone, Promethazine, and Nsaids    Social History:  The patient  reports that he has never smoked. He has never used  smokeless tobacco. He reports that he does not drink alcohol or use drugs.   Family History:  The patient's family history includes Atrial fibrillation in his brother; Diabetes in his brother; Emphysema in his mother; Lung cancer in his father and sister; Throat cancer in his sister.    ROS:  Please see the history of present illness.   Otherwise, review of systems are positive for .   All other systems are reviewed and negative.    PHYSICAL EXAM: VS:  BP 108/72   Pulse 62   Ht 5\' 6"  (1.676 m)   Wt 221 lb (100.2 kg)   SpO2 98%   BMI 35.67 kg/m  , BMI Body mass index is 35.67 kg/m. GEN: Well nourished, well developed, in no acute distress  HEENT: normal  Neck: no JVD, carotid bruits, or masses Cardiac: RRR; no murmurs, rubs, or gallops,no edema  Respiratory:  clear to auscultation bilaterally, normal work of breathing GI: soft, nontender, nondistended, + BS MS: no deformity or atrophy  Skin: warm and dry, no rash Neuro:  Strength and sensation are intact Psych: euthymic mood, full affect   EKG:   The ekg ordered today demonstrates NSR, no ST changes   Recent Labs: 03/18/2019: ALT 15; BUN 20; Creatinine, Ser 1.17; Hemoglobin 15.3; Platelets 197.0; Potassium 4.7; Sodium 140   Lipid Panel    Component Value Date/Time   CHOL 181 07/24/2013 0530   TRIG 99 07/24/2013 0530   HDL 52 07/24/2013 0530   CHOLHDL 3.5 07/24/2013 0530   VLDL 20 07/24/2013 0530   LDLCALC 109 (H) 07/24/2013 0530     Other studies Reviewed: Additional studies/ records that were reviewed today with results demonstrating: PMD labs reviewed.   ASSESSMENT AND PLAN:  1. Persistent atrial fibrillation: Status post convergent procedure at [facility].  Maintaining NSR.   No sx of AFib.  2. Hypertension: The current medical regimen is effective;  continue present plan and medications. 3. Morbid obesity: Difficulty losing weight. A1C 7.8- metformin increased for diabetes. 4. Hyperlipidemia: LDL 124 on pravastatin.   Stop pravastatin,  Start rosuvastatin 10 mg daily.  LDL target < 100.  Lipids will be checked in August 2021. 5. Anticoagulated: Continue Xarelto or stroke prevention.     Current medicines are reviewed at length with the patient today.  The patient concerns regarding his medicines were addressed.  The following changes have been made:  No change  Labs/ tests ordered today include:  No orders of the defined types were placed in this encounter.   Recommend 150 minutes/week of aerobic exercise Low fat, low carb, high fiber diet recommended  Disposition:   FU in 1 year   Signed, Larae Grooms, MD  07/23/2019 9:16 AM    [facility] [place], [place], [place]  [address] Phone: [phone]; Fax: [phone]

## 2019-07-23 ENCOUNTER — Other Ambulatory Visit: Payer: Self-pay

## 2019-07-23 ENCOUNTER — Ambulatory Visit (INDEPENDENT_AMBULATORY_CARE_PROVIDER_SITE_OTHER): Payer: Medicare Other | Admitting: Interventional Cardiology

## 2019-07-23 ENCOUNTER — Encounter: Payer: Self-pay | Admitting: Interventional Cardiology

## 2019-07-23 VITALS — BP 108/72 | HR 62 | Ht 66.0 in | Wt 221.0 lb

## 2019-07-23 DIAGNOSIS — I4821 Permanent atrial fibrillation: Secondary | ICD-10-CM

## 2019-07-23 DIAGNOSIS — E782 Mixed hyperlipidemia: Secondary | ICD-10-CM | POA: Diagnosis not present

## 2019-07-23 DIAGNOSIS — I10 Essential (primary) hypertension: Secondary | ICD-10-CM

## 2019-07-23 DIAGNOSIS — Z7901 Long term (current) use of anticoagulants: Secondary | ICD-10-CM

## 2019-07-23 MED ORDER — ROSUVASTATIN CALCIUM 10 MG PO TABS: 10.0000 mg | ORAL_TABLET | Freq: Every day | ORAL | 3 refills | Status: DC

## 2019-07-23 NOTE — Patient Instructions (Signed)
Medication Instructions:  Your physician has recommended you make the following change in your medication:   1. STOP: pravastatin  2. START: rosuvastatin (crestor) 10 mg tablet: Take 1 tablet by mouth once a day  *If you need a refill on your cardiac medications before your next appointment, please call your pharmacy*   Lab Work: None ordered- To be done with Primary Care  If you have labs (blood work) drawn today and your tests are completely normal, you will receive your results only by: Marland Kitchen MyChart Message (if you have MyChart) OR . A paper copy in the mail If you have any lab test that is abnormal or we need to change your treatment, we will call you to review the results.   Testing/Procedures: None ordered   Follow-Up: At [facility], you and your health needs are our priority.  As part of our continuing mission to provide you with exceptional heart care, we have created designated Provider Care Teams.  These Care Teams include your primary Cardiologist (physician) and Advanced Practice Providers (APPs -  Physician Assistants and Nurse Practitioners) who all work together to provide you with the care you need, when you need it.  We recommend signing up for the patient portal called "MyChart".  Sign up information is provided on this After Visit Summary.  MyChart is used to connect with patients for Virtual Visits (Telemedicine).  Patients are able to view lab/test results, encounter notes, upcoming appointments, etc.  Non-urgent messages can be sent to your provider as well.   To learn more about what you can do with MyChart, go to [email or web address].    Your next appointment:   12 month(s)  The format for your next appointment:   In Person  Provider:   You may see Casandra Doffing, MD or one of the following Advanced Practice Providers on your designated Care Team:    Melina Copa, PA-C  Ermalinda Barrios, PA-C    Other Instructions  High-Fiber Diet Fiber, also called  dietary fiber, is a type of carbohydrate that is found in fruits, vegetables, whole grains, and beans. A high-fiber diet can have many health benefits. Your health care provider may recommend a high-fiber diet to help:  Prevent constipation. Fiber can make your bowel movements more regular.  Lower your cholesterol.  Relieve the following conditions: ? Swelling of veins in the anus (hemorrhoids). ? Swelling and irritation (inflammation) of specific areas of the digestive tract (uncomplicated diverticulosis). ? A problem of the large intestine (colon) that sometimes causes pain and diarrhea (irritable bowel syndrome, IBS).  Prevent overeating as part of a weight-loss plan.  Prevent heart disease, type 2 diabetes, and certain cancers. What is my plan? The recommended daily fiber intake in grams (g) includes:  38 g for men age 81 or younger.  30 g for men over age 17.  68 g for women age 44 or younger.  21 g for women over age 42. You can get the recommended daily intake of dietary fiber by:  Eating a variety of fruits, vegetables, grains, and beans.  Taking a fiber supplement, if it is not possible to get enough fiber through your diet. What do I need to know about a high-fiber diet?  It is better to get fiber through food sources rather than from fiber supplements. There is not a lot of research about how effective supplements are.  Always check the fiber content on the nutrition facts label of any prepackaged food. Look for foods that contain  5 g of fiber or more per serving.  Talk with a diet and nutrition specialist (dietitian) if you have questions about specific foods that are recommended or not recommended for your medical condition, especially if those foods are not listed below.  Gradually increase how much fiber you consume. If you increase your intake of dietary fiber too quickly, you may have bloating, cramping, or gas.  Drink plenty of water. Water helps you to digest  fiber. What are tips for following this plan?  Eat a wide variety of high-fiber foods.  Make sure that half of the grains that you eat each day are whole grains.  Eat breads and cereals that are made with whole-grain flour instead of refined flour or white flour.  Eat brown rice, bulgur wheat, or millet instead of white rice.  Start the day with a breakfast that is high in fiber, such as a cereal that contains 5 g of fiber or more per serving.  Use beans in place of meat in soups, salads, and pasta dishes.  Eat high-fiber snacks, such as berries, raw vegetables, nuts, and popcorn.  Choose whole fruits and vegetables instead of processed forms like juice or sauce. What foods can I eat?  Fruits Berries. Pears. Apples. Oranges. Avocado. Prunes and raisins. Dried figs. Vegetables Sweet potatoes. Spinach. Kale. Artichokes. Cabbage. Broccoli. Cauliflower. Green peas. Carrots. Squash. Grains Whole-grain breads. Multigrain cereal. Oats and oatmeal. Brown rice. Barley. Bulgur wheat. [place]. Quinoa. Bran muffins. Popcorn. Rye wafer crackers. Meats and other proteins Navy, kidney, and pinto beans. Soybeans. Split peas. Lentils. Nuts and seeds. Dairy Fiber-fortified yogurt. Beverages Fiber-fortified soy milk. Fiber-fortified orange juice. Other foods Fiber bars. The items listed above may not be a complete list of recommended foods and beverages. Contact a dietitian for more options. What foods are not recommended? Fruits Fruit juice. Cooked, strained fruit. Vegetables Fried potatoes. Canned vegetables. Well-cooked vegetables. Grains White bread. Pasta made with refined flour. White rice. Meats and other proteins Fatty cuts of meat. Fried chicken or fried fish. Dairy Milk. Yogurt. Cream cheese. Sour cream. Fats and oils Butters. Beverages Soft drinks. Other foods Cakes and pastries. The items listed above may not be a complete list of foods and beverages to avoid. Contact a  dietitian for more information. Summary  Fiber is a type of carbohydrate. It is found in fruits, vegetables, whole grains, and beans.  There are many health benefits of eating a high-fiber diet, such as preventing constipation, lowering blood cholesterol, helping with weight loss, and reducing your risk of heart disease, diabetes, and certain cancers.  Gradually increase your intake of fiber. Increasing too fast can result in cramping, bloating, and gas. Drink plenty of water while you increase your fiber.  The best sources of fiber include whole fruits and vegetables, whole grains, nuts, seeds, and beans. This information is not intended to replace advice given to you by your health care provider. Make sure you discuss any questions you have with your health care provider. Document Revised: 01/30/2017 Document Reviewed: 01/30/2017 Elsevier Patient Education  2020 [employer].

## 2019-08-14 DIAGNOSIS — M25531 Pain in right wrist: Secondary | ICD-10-CM | POA: Diagnosis not present

## 2019-08-14 DIAGNOSIS — G5601 Carpal tunnel syndrome, right upper limb: Secondary | ICD-10-CM | POA: Diagnosis not present

## 2019-08-14 DIAGNOSIS — G5621 Lesion of ulnar nerve, right upper limb: Secondary | ICD-10-CM | POA: Diagnosis not present

## 2019-08-14 DIAGNOSIS — M19039 Primary osteoarthritis, unspecified wrist: Secondary | ICD-10-CM | POA: Diagnosis not present

## 2019-08-17 DIAGNOSIS — G5601 Carpal tunnel syndrome, right upper limb: Secondary | ICD-10-CM | POA: Diagnosis not present

## 2019-08-24 DIAGNOSIS — M25531 Pain in right wrist: Secondary | ICD-10-CM | POA: Diagnosis not present

## 2019-08-28 DIAGNOSIS — D2261 Melanocytic nevi of right upper limb, including shoulder: Secondary | ICD-10-CM | POA: Diagnosis not present

## 2019-08-28 DIAGNOSIS — L718 Other rosacea: Secondary | ICD-10-CM | POA: Diagnosis not present

## 2019-08-28 DIAGNOSIS — D1801 Hemangioma of skin and subcutaneous tissue: Secondary | ICD-10-CM | POA: Diagnosis not present

## 2019-08-28 DIAGNOSIS — D225 Melanocytic nevi of trunk: Secondary | ICD-10-CM | POA: Diagnosis not present

## 2019-08-28 DIAGNOSIS — L821 Other seborrheic keratosis: Secondary | ICD-10-CM | POA: Diagnosis not present

## 2019-08-28 DIAGNOSIS — G5601 Carpal tunnel syndrome, right upper limb: Secondary | ICD-10-CM | POA: Diagnosis not present

## 2019-08-28 DIAGNOSIS — L570 Actinic keratosis: Secondary | ICD-10-CM | POA: Diagnosis not present

## 2019-08-28 DIAGNOSIS — M25531 Pain in right wrist: Secondary | ICD-10-CM | POA: Diagnosis not present

## 2019-08-28 DIAGNOSIS — M13839 Other specified arthritis, unspecified wrist: Secondary | ICD-10-CM | POA: Diagnosis not present

## 2019-09-02 DIAGNOSIS — M25562 Pain in left knee: Secondary | ICD-10-CM | POA: Diagnosis not present

## 2019-09-02 DIAGNOSIS — M25561 Pain in right knee: Secondary | ICD-10-CM | POA: Diagnosis not present

## 2019-09-13 ENCOUNTER — Other Ambulatory Visit: Payer: Self-pay | Admitting: Interventional Cardiology

## 2019-09-13 NOTE — Telephone Encounter (Signed)
Pt last saw Dr Irish Lack 07/23/19, last labs 03/18/19 Creat 1.17, age 69, weight 100.2kg, CrCl 84.45, based on CrCl pt is on appropriate dosage of Xarelto 20mg  QD.  Will refill rx.

## 2019-10-28 DIAGNOSIS — E1129 Type 2 diabetes mellitus with other diabetic kidney complication: Secondary | ICD-10-CM | POA: Diagnosis not present

## 2019-10-28 DIAGNOSIS — I87332 Chronic venous hypertension (idiopathic) with ulcer and inflammation of left lower extremity: Secondary | ICD-10-CM | POA: Diagnosis not present

## 2019-10-28 DIAGNOSIS — I739 Peripheral vascular disease, unspecified: Secondary | ICD-10-CM | POA: Diagnosis not present

## 2019-10-28 DIAGNOSIS — I129 Hypertensive chronic kidney disease with stage 1 through stage 4 chronic kidney disease, or unspecified chronic kidney disease: Secondary | ICD-10-CM | POA: Diagnosis not present

## 2019-10-28 DIAGNOSIS — N182 Chronic kidney disease, stage 2 (mild): Secondary | ICD-10-CM | POA: Diagnosis not present

## 2019-11-01 DIAGNOSIS — N182 Chronic kidney disease, stage 2 (mild): Secondary | ICD-10-CM | POA: Diagnosis not present

## 2019-11-01 DIAGNOSIS — I87332 Chronic venous hypertension (idiopathic) with ulcer and inflammation of left lower extremity: Secondary | ICD-10-CM | POA: Diagnosis not present

## 2019-11-01 DIAGNOSIS — I739 Peripheral vascular disease, unspecified: Secondary | ICD-10-CM | POA: Diagnosis not present

## 2019-11-01 DIAGNOSIS — E1129 Type 2 diabetes mellitus with other diabetic kidney complication: Secondary | ICD-10-CM | POA: Diagnosis not present

## 2019-11-14 DIAGNOSIS — E782 Mixed hyperlipidemia: Secondary | ICD-10-CM | POA: Diagnosis not present

## 2019-11-14 DIAGNOSIS — Z125 Encounter for screening for malignant neoplasm of prostate: Secondary | ICD-10-CM | POA: Diagnosis not present

## 2019-11-14 DIAGNOSIS — E890 Postprocedural hypothyroidism: Secondary | ICD-10-CM | POA: Diagnosis not present

## 2019-11-14 DIAGNOSIS — E1129 Type 2 diabetes mellitus with other diabetic kidney complication: Secondary | ICD-10-CM | POA: Diagnosis not present

## 2019-11-21 DIAGNOSIS — I739 Peripheral vascular disease, unspecified: Secondary | ICD-10-CM | POA: Diagnosis not present

## 2019-11-21 DIAGNOSIS — I10 Essential (primary) hypertension: Secondary | ICD-10-CM | POA: Diagnosis not present

## 2019-11-21 DIAGNOSIS — E1129 Type 2 diabetes mellitus with other diabetic kidney complication: Secondary | ICD-10-CM | POA: Diagnosis not present

## 2019-11-21 DIAGNOSIS — G4733 Obstructive sleep apnea (adult) (pediatric): Secondary | ICD-10-CM | POA: Diagnosis not present

## 2019-11-21 DIAGNOSIS — E782 Mixed hyperlipidemia: Secondary | ICD-10-CM | POA: Diagnosis not present

## 2019-11-21 DIAGNOSIS — I129 Hypertensive chronic kidney disease with stage 1 through stage 4 chronic kidney disease, or unspecified chronic kidney disease: Secondary | ICD-10-CM | POA: Diagnosis not present

## 2019-11-21 DIAGNOSIS — M19031 Primary osteoarthritis, right wrist: Secondary | ICD-10-CM | POA: Diagnosis not present

## 2019-11-21 DIAGNOSIS — I480 Paroxysmal atrial fibrillation: Secondary | ICD-10-CM | POA: Diagnosis not present

## 2019-11-21 DIAGNOSIS — I87332 Chronic venous hypertension (idiopathic) with ulcer and inflammation of left lower extremity: Secondary | ICD-10-CM | POA: Diagnosis not present

## 2019-11-21 DIAGNOSIS — K219 Gastro-esophageal reflux disease without esophagitis: Secondary | ICD-10-CM | POA: Diagnosis not present

## 2019-11-21 DIAGNOSIS — E890 Postprocedural hypothyroidism: Secondary | ICD-10-CM | POA: Diagnosis not present

## 2020-01-03 DIAGNOSIS — Z23 Encounter for immunization: Secondary | ICD-10-CM | POA: Diagnosis not present

## 2020-01-21 ENCOUNTER — Telehealth: Payer: Self-pay | Admitting: *Deleted

## 2020-01-21 NOTE — Telephone Encounter (Addendum)
Patient with diagnosis of afib on xarelto for anticoagulation.    Procedure: RIGHT CTR, RT WRIST PROXIMAL ROW CARPECTON  Date of procedure: TBD  CHADS2-VASc score of  6 (CHF, HTN, AGE, DM2, stroke/tia x 2??)  CrCl 66 ml/min  Patient had a questionable TIA in 2015. I will ask Dr. Irish Lack for input

## 2020-01-21 NOTE — Progress Notes (Signed)
Cardiology Office Note   Date:  01/22/2020   ID:  Shogo, Larkey 09-Feb-1951, MRN [id number]  PCP:  Leanna Battles, MD    No chief complaint on file.  AFib  Wt Readings from Last 3 Encounters:  01/22/20 213 lb 3.2 oz (96.7 kg)  07/23/19 221 lb (100.2 kg)  04/24/18 221 lb (100.2 kg)       History of Present Illness: [name] [name] is a 69 y.o. male  with a h/o pesisitent afib s/p 2 ablations andin11/16underwent a convergent afib ablation with Dr. Lehman Prom and Dr. Danise Mina at [facility]. He had some complications with cardiogenic shock per pt and had to be intubated and spend 4 days in intensive care.  It was recommended that he stop his Multaq.He wanted to stop Xarelto.After discussion with Dr. Rayann Heman in 2019, it was recommended that he continue Xarelto.  He was very symptomatic in the past when he had AFib.  Wife has RA, takes immunosuppression.  They have been careful during COVID.    Denies : Chest pain. Dizziness. Leg edema. Nitroglycerin use. Orthopnea. Palpitations. Paroxysmal nocturnal dyspnea. Shortness of breath. Syncope.   Needs wrist surgery, which will be prolonged.    Chops wood, raking leaves as well.  No problems with those activities.  BP at home not checked.   Walks up hill and no angina.     Past Medical History:  Diagnosis Date  . Arthritis    OA  . Atrial flutter ([place])    a. s/p RFCA 8/13  . [facility]) 2010   prostate  . Chronic combined systolic and diastolic CHF (congestive heart failure) ([place])    a. LVEF previously 35% felt to be due tachycardia;  b. 02/2013 Echo: EF 50-55%, no rwma, mildly dil LA/RA, mild to mod MR. C 11/2014 echo - ef 60-65%, unable to determine DD  . CKD (chronic kidney disease), stage III (HCC)    hx of a/c renal failure during episode of diverticulitis 9/13 in [place], [place]  . Diabetes mellitus ([place])    TYPE 2   . Diverticulitis   . Diverticulosis   . Dyslipidemia   . Erectile dysfunction   . Gastric  ulcer    s/p prior surgery  . GERD (gastroesophageal reflux disease)   . Headache   . History of colonic diverticulitis   . History of prostate cancer   . Hypertension   . Hypothyroid   . Microcytic anemia   . Obesities, morbid ([place])   . Obstructive sleep apnea    noncompliant with CPAP  . Osteoporosis   . PAF (paroxysmal atrial fibrillation) ([place])    a. failed DCCV and multiple anti-arrhythmic drugs (multaq/flecainide);   b. s/p  PVI isolation with ablation of AFib 8/13; c. repeat PVI 01/2013;  d. 02/2013 repeat DCCV->Amio load/xarelto.  . Peptic ulcer disease 1994  . Renal calculi   . Sleep apnea 2015   has not had sleep apnea since heart intervention  . Thrombocytopenia ([place]) 1992   idopathic-treated with danazol  . Tubular adenoma of colon     Past Surgical History:  Procedure Laterality Date  . ATRIAL FIBRILLATION ABLATION  12/06/11; 02/05/2013   Afib and atrial flutter ablation by Dr Rayann Heman; repeat PVI by Dr Rayann Heman 02/05/2013  . ATRIAL FIBRILLATION ABLATION N/A 12/06/2011   Procedure: ATRIAL FIBRILLATION ABLATION;  Surgeon: Thompson Grayer, MD;  Location: [facility] CATH LAB;  Service: Cardiovascular;  Laterality: N/A;  . ATRIAL FIBRILLATION ABLATION N/A 02/05/2013   Procedure:  ATRIAL FIBRILLATION ABLATION;  Surgeon: Coralyn Mark, MD;  Location: [place] CATH LAB;  Service: Cardiovascular;  Laterality: N/A;  . benign stomach tumor removal  2000  . CARDIOVERSION  08/25/2011   Procedure: CARDIOVERSION;  Surgeon: Jettie Booze, MD;  Location: [place];  Service: Cardiovascular;  Laterality: N/A;  . CARDIOVERSION N/A 03/05/2013   Procedure: CARDIOVERSION;  Surgeon: Sinclair Grooms, MD;  Location: [place];  Service: Cardiovascular;  Laterality: N/A;  BEDSIDE   . convergent afib abaltion [facility] 02/26/15  02/26/15   [facility] by Dr. Lehman Prom and Dr. Danise Mina  . ELECTROPHYSIOLOGIC STUDY N/A 11/24/2014   Procedure: Cardioversion;  Surgeon: Will Meredith Leeds, MD;  Location: [place] CV LAB;   Service: Cardiovascular;  Laterality: N/A;  . ESOPHAGOGASTRODUODENOSCOPY N/A 04/09/2013   Procedure: ESOPHAGOGASTRODUODENOSCOPY (EGD) with possible Balloon Dilation;  Surgeon: Garlan Fair, MD;  Location: [facility] ENDOSCOPY;  Service: Endoscopy;  Laterality: N/A;  . kidney stone removal     multiple  . KNEE ARTHROSCOPY Bilateral 1979  . LAPAROSCOPIC RETROPUBIC PROSTATECTOMY  02/2007   hx prostate cancer  . ORIF ANKLE FRACTURE Right 10/2015  . ORIF ANKLE FRACTURE Right 11/03/2015   Procedure: ORIF RIGHT LISFRANK FOOT;  Surgeon: Newt Minion, MD;  Location: [place];  Service: Orthopedics;  Laterality: Right;  . TEE WITHOUT CARDIOVERSION  08/25/2011   Procedure: TRANSESOPHAGEAL ECHOCARDIOGRAM (TEE);  Surgeon: Jettie Booze, MD;  Location: [place];  Service: Cardiovascular;  Laterality: N/A;  . TEE WITHOUT CARDIOVERSION  11/09/2011   Procedure: TRANSESOPHAGEAL ECHOCARDIOGRAM (TEE);  Surgeon: Jettie Booze, MD;  Location: [facility] ENDOSCOPY;  Service: Cardiovascular;  Laterality: N/A;  h/p in file drawer  . TEE WITHOUT CARDIOVERSION N/A 02/05/2013   Procedure: TRANSESOPHAGEAL ECHOCARDIOGRAM (TEE);  Surgeon: Candee Furbish, MD;  Location: [facility] ENDOSCOPY;  Service: Cardiovascular;  Laterality: N/A;  . TOTAL THYROIDECTOMY  2009   benign nodules removed  . WRIST SURGERY Right 1977   with placement of lunate prosthesis     Current Outpatient Medications  Medication Sig Dispense Refill  . albuterol (PROVENTIL) (2.5 MG/3ML) 0.083% nebulizer solution Take 2.5 mg by nebulization 2 (two) times daily as needed for wheezing or shortness of breath.     Marland Kitchen amoxicillin-clavulanate (AUGMENTIN) 875-125 MG tablet Take 1 tablet by mouth 2 (two) times daily. 20 tablet 0  . Cholecalciferol (VITAMIN D) 2000 UNITS CAPS Take 2,000 Units by mouth daily.    Marland Kitchen diltiazem (CARDIZEM) 30 MG tablet Take 1 tablet (30 mg total) by mouth as needed (Every 4 hours AS NEEDED with a HR >465 and Systolic >681 (BP)). 30 tablet 3    . DULoxetine (CYMBALTA) 30 MG capsule Take 1 capsule by mouth every morning.    Marland Kitchen esomeprazole (NEXIUM) 40 MG capsule Take 40 mg by mouth daily at 12 noon.    . ferrous sulfate 325 (65 FE) MG tablet Take 1 tablet (325 mg total) by mouth daily with breakfast. 90 tablet 1  . folic acid (FOLVITE) 1 MG tablet Take 1 mg by mouth daily.    Marland Kitchen GLUCOSAMINE PO Take 1,000 mg by mouth 2 (two) times daily.     Marland Kitchen imipramine (TOFRANIL) 25 MG tablet Take 50 mg by mouth at bedtime.     Marland Kitchen JARDIANCE 25 MG TABS tablet Take 25 mg by mouth daily.    Marland Kitchen levothyroxine (SYNTHROID) 175 MCG tablet Take 175 mcg by mouth daily.    Marland Kitchen losartan (COZAAR) 50 MG tablet Take 50 mg by mouth daily.    Marland Kitchen  metFORMIN (GLUCOPHAGE-XR) 500 MG 24 hr tablet Take 1,000 mg by mouth 2 (two) times daily.    . Multiple Vitamins-Minerals (MENS 50+ MULTI VITAMIN/MIN PO) Take 1 tablet by mouth daily.    . polycarbophil (FIBERCON) 625 MG tablet Take 1,250 mg by mouth daily.    . potassium chloride SA (K-DUR,KLOR-CON) 20 MEQ tablet Take 2 tablets (40 mEq total) by mouth daily. 180 tablet 1  . rivaroxaban (XARELTO) 20 MG TABS tablet Take 1 tablet (20 mg total) by mouth daily with supper. 90 tablet 1  . rosuvastatin (CRESTOR) 10 MG tablet Take 1 tablet (10 mg total) by mouth daily. 90 tablet 3  . tamsulosin (FLOMAX) 0.4 MG CAPS capsule Take 0.4 mg by mouth daily as needed.    . carvedilol (COREG) 25 MG tablet Take 1 tablet (25 mg total) by mouth daily. In the evening (Patient taking differently: Take 25 mg by mouth 2 (two) times daily with a meal. In the evening) 90 tablet 3  . ondansetron (ZOFRAN) 4 MG tablet Take 1 tablet by mouth as needed for nausea.    Marland Kitchen OZEMPIC, 0.25 OR 0.5 MG/DOSE, 2 MG/1.5ML SOPN Inject 1 mL into the skin once a week.     Current Facility-Administered Medications  Medication Dose Route Frequency Provider Last Rate Last Admin  . 0.9 %  sodium chloride infusion  500 mL Intravenous Once Pyrtle, Lajuan Lines, MD        Allergies:    Phenergan [promethazine hcl], Aspirin, Bystolic [nebivolol hcl], Calcium channel blockers, Prednisone, Promethazine, and Nsaids    Social History:  The patient  reports that he has never smoked. He has never used smokeless tobacco. He reports that he does not drink alcohol and does not use drugs.   Family History:  The patient's \ family history includes Atrial fibrillation in his brother; Diabetes in his brother; Emphysema in his mother; Lung cancer in his father and sister; Throat cancer in his sister.    ROS:  Please see the history of present illness.   Otherwise, review of systems are positive for wrist pain.   All other systems are reviewed and negative.    PHYSICAL EXAM: VS:  BP 112/78   Pulse 68   Ht 5\' 6"  (1.676 m)   Wt 213 lb 3.2 oz (96.7 kg)   SpO2 96%   BMI 34.41 kg/m  , BMI Body mass index is 34.41 kg/m. GEN: Well nourished, well developed, in no acute distress  HEENT: normal  Neck: no JVD, carotid bruits, or masses Cardiac: RRR; no murmurs, rubs, or gallops,no edema  Respiratory:  clear to auscultation bilaterally, normal work of breathing GI: soft, nontender, nondistended, + BS, obese MS: no deformity or atrophy  Skin: warm and dry, no rash Neuro:  Strength and sensation are intact Psych: euthymic mood, full affect   EKG:   The ekg ordered 4/21 demonstrates NSR   Recent Labs: 03/18/2019: ALT 15; BUN 20; Creatinine, Ser 1.17; Hemoglobin 15.3; Platelets 197.0; Potassium 4.7; Sodium 140   Lipid Panel    Component Value Date/Time   CHOL 181 07/24/2013 0530   TRIG 99 07/24/2013 0530   HDL 52 07/24/2013 0530   CHOLHDL 3.5 07/24/2013 0530   VLDL 20 07/24/2013 0530   LDLCALC 109 (H) 07/24/2013 0530     Other studies Reviewed: Additional studies/ records that were reviewed today with results demonstrating: labs reviewed.   ASSESSMENT AND PLAN:  1. Persistent atrial fibrillation: Maintaining NSR.  COntiue current meds.  2.  Preoperative cardiovascular eval:  Has some trouble gripping steering wheel.  Wrist surgery- prolonged duration.  No further testing needed before surgery.  OK to hold Xarelto or 2 days prior to wrist surgery if needed.  3. HTN: The current medical regimen is effective;  continue present plan and medications. 4. Morbid obesity: Still trying to lose weight with healthy diet.  5. Hyperlipidemia: LDL 124 6. Anticoagulated: No bleeding issues.  7. DM: A1C 7.8.  Ozempic was added.  Some GI side effects with that.    Current medicines are reviewed at length with the patient today.  The patient concerns regarding his medicines were addressed.  The following changes have been made:  No change  Labs/ tests ordered today include:  No orders of the defined types were placed in this encounter.   Recommend 150 minutes/week of aerobic exercise Low fat, low carb, high fiber diet recommended  Disposition:   FU in 1 year   Signed, Larae Grooms, MD  01/22/2020 9:46 AM    [facility] [address], [place], [place]  [address] Phone: [phone]; Fax: [phone]

## 2020-01-21 NOTE — Telephone Encounter (Signed)
OK to hold Xarelto 2 days prior to surgery.  JV

## 2020-01-21 NOTE — Telephone Encounter (Signed)
   [facility] Pre-operative Risk Assessment    HEARTCARE STAFF: - Please ensure there is not already an duplicate clearance open for this procedure. - Under Visit Info/Reason for Call, type in Other and utilize the format Clearance MM/DD/YY or Clearance TBD. Do not use dashes or single digits. - If request is for dental extraction, please clarify the # of teeth to be extracted.  Request for surgical clearance:  1. What type of surgery is being performed? RIGHT CTR, RT WRIST PROXIMAL ROW CARPECTON   2. When is this surgery scheduled? TBD   3. What type of clearance is required (medical clearance vs. Pharmacy clearance to hold med vs. Both)? BOTH  4. Are there any medications that need to be held prior to surgery and how long? [place]   5. Practice name and name of physician performing surgery? [facility]; DR. Gwyndolyn Saxon GRAMIG   6. What is the office phone number? [phone]   7.   What is the office fax number? [phone] ATN: SHERRY WILLS  8.   Anesthesia type (None, local, MAC, general) ? BLOCK WITH IV SEDATION   Julaine Hua 01/21/2020, 11:48 AM  _________________________________________________________________   (provider comments below)

## 2020-01-22 ENCOUNTER — Ambulatory Visit (INDEPENDENT_AMBULATORY_CARE_PROVIDER_SITE_OTHER): Payer: Medicare Other | Admitting: Interventional Cardiology

## 2020-01-22 ENCOUNTER — Other Ambulatory Visit: Payer: Self-pay

## 2020-01-22 ENCOUNTER — Encounter: Payer: Self-pay | Admitting: Interventional Cardiology

## 2020-01-22 VITALS — BP 112/78 | HR 68 | Ht 66.0 in | Wt 213.2 lb

## 2020-01-22 DIAGNOSIS — I10 Essential (primary) hypertension: Secondary | ICD-10-CM

## 2020-01-22 DIAGNOSIS — I4821 Permanent atrial fibrillation: Secondary | ICD-10-CM | POA: Diagnosis not present

## 2020-01-22 DIAGNOSIS — E782 Mixed hyperlipidemia: Secondary | ICD-10-CM | POA: Diagnosis not present

## 2020-01-22 DIAGNOSIS — Z7901 Long term (current) use of anticoagulants: Secondary | ICD-10-CM

## 2020-01-22 NOTE — Patient Instructions (Signed)

## 2020-01-22 NOTE — Telephone Encounter (Signed)
° °  Primary Cardiologist: Larae Grooms, MD  Chart reviewed as part of pre-operative protocol coverage. Given past medical history and time since last visit, based on ACC/AHA guidelines, [name] [name] would be at acceptable risk for the planned procedure without further cardiovascular testing.   He may hold his Xarelto 2 days prior to surgery.  Please resume as soon as hemostasis is achieved.  Patient was advised that if he develops new symptoms prior to surgery to contact our office to arrange a follow-up appointment.  He verbalized understanding.  I will route this recommendation to the requesting party via Epic fax function and remove from pre-op pool.  Please call with questions.  Jossie Ng. Amilah Greenspan NP-C    01/22/2020, [address] Office [phone] Fax [phone]

## 2020-02-11 DIAGNOSIS — N182 Chronic kidney disease, stage 2 (mild): Secondary | ICD-10-CM | POA: Diagnosis not present

## 2020-02-11 DIAGNOSIS — I129 Hypertensive chronic kidney disease with stage 1 through stage 4 chronic kidney disease, or unspecified chronic kidney disease: Secondary | ICD-10-CM | POA: Diagnosis not present

## 2020-02-11 DIAGNOSIS — L089 Local infection of the skin and subcutaneous tissue, unspecified: Secondary | ICD-10-CM | POA: Diagnosis not present

## 2020-02-11 DIAGNOSIS — E1129 Type 2 diabetes mellitus with other diabetic kidney complication: Secondary | ICD-10-CM | POA: Diagnosis not present

## 2020-02-17 DIAGNOSIS — M25531 Pain in right wrist: Secondary | ICD-10-CM | POA: Diagnosis not present

## 2020-02-17 DIAGNOSIS — G5601 Carpal tunnel syndrome, right upper limb: Secondary | ICD-10-CM | POA: Diagnosis not present

## 2020-02-17 DIAGNOSIS — M79645 Pain in left finger(s): Secondary | ICD-10-CM | POA: Diagnosis not present

## 2020-02-22 ENCOUNTER — Encounter (HOSPITAL_COMMUNITY): Payer: Self-pay | Admitting: Orthopedic Surgery

## 2020-02-22 ENCOUNTER — Other Ambulatory Visit (HOSPITAL_COMMUNITY)
Admission: RE | Admit: 2020-02-22 | Discharge: 2020-02-22 | Disposition: A | Discharge status: Home / Self Care | Payer: Medicare Other | Source: Ambulatory Visit | Attending: Orthopedic Surgery | Admitting: Orthopedic Surgery

## 2020-02-22 DIAGNOSIS — Z01812 Encounter for preprocedural laboratory examination: Secondary | ICD-10-CM | POA: Insufficient documentation

## 2020-02-22 DIAGNOSIS — Z20822 Contact with and (suspected) exposure to covid-19: Secondary | ICD-10-CM | POA: Insufficient documentation

## 2020-02-22 LAB — SARS CORONAVIRUS 2 (TAT 6-24 HRS): SARS Coronavirus 2: NEGATIVE

## 2020-02-22 NOTE — Progress Notes (Signed)
Denies chest pain or shortness of breath. Cardiology notes in chart. Last dose of Xarelto on 02/21/20. Requested patient stop vitamins and supplements today. Educated on Environmental manager. Reminded patient to quarantine. States blood sugar does not get below 70. Educated to call if below 70 am of surgery. Advised not to take Metformin or Jardiance DOS.

## 2020-02-24 NOTE — Anesthesia Preprocedure Evaluation (Addendum)
Anesthesia Evaluation  Patient identified by MRN, date of birth, ID band Patient awake    Reviewed: Allergy & Precautions, H&P , NPO status , Patient's Chart, lab work & pertinent test results, reviewed documented beta blocker date and time   Airway Mallampati: II  TM Distance: >3 FB Neck ROM: Full    Dental no notable dental hx. (+) Teeth Intact, Dental Advisory Given   Pulmonary    Pulmonary exam normal breath sounds clear to auscultation       Cardiovascular hypertension, Pt. on medications and Pt. on home beta blockers +CHF   Rhythm:Regular Rate:Normal     Neuro/Psych  Headaches, TIAnegative psych ROS   GI/Hepatic Neg liver ROS, PUD, GERD  Medicated,  Endo/Other  diabetes, Type 2, Oral Hypoglycemic AgentsHypothyroidism Morbid obesity  Renal/GU negative Renal ROS  negative genitourinary   Musculoskeletal  (+) Arthritis , Osteoarthritis,    Abdominal   Peds  Hematology  (+) Blood dyscrasia, anemia ,   Anesthesia Other Findings   Reproductive/Obstetrics negative OB ROS                           Anesthesia Physical Anesthesia Plan  ASA: III  Anesthesia Plan: MAC and Regional   Post-op Pain Management:    Induction: Intravenous  PONV Risk Score and Plan: 1 and Propofol infusion, Midazolam and Ondansetron  Airway Management Planned: Simple Face Mask  Additional Equipment:   Intra-op Plan:   Post-operative Plan:   Informed Consent: I have reviewed the patients History and Physical, chart, labs and discussed the procedure including the risks, benefits and alternatives for the proposed anesthesia with the patient or authorized representative who has indicated his/her understanding and acceptance.     Dental advisory given  Plan Discussed with: CRNA  Anesthesia Plan Comments: (PAT note by Karoline Caldwell, PA-C: Follows with cardiology for history of atrial fibrillation status post  2 ablations and in 02/2015 underwent convergent A. fib ablation at [facility].  Last seen by Dr. Irish Lack 01/22/2020, maintaining NSR at that time.  Per note, "Chops wood, raking leaves as well.  No problems with those activities.Marland KitchenMarland KitchenPreoperative cardiovascular eval: Has some trouble gripping steering wheel.  Wrist surgery- prolonged duration.  No further testing needed before surgery.  OK to hold Xarelto or 2 days prior to wrist surgery if needed."  Patient reported last dose of Xarelto on 02/21/2020  History of CKD 3.  We will need day of surgery labs and evaluation.  EKG 07/23/2019: Sinus rhythm.  Rate 62.  Limited echo 02/27/2015 (Care Everywhere):  Limited study to assess for pericardial effusion   Left ventricular hypertrophy - mild   Normal left ventricular systolic function, ejection fraction 55 to 60%   Reduced right ventricular systolic function   No evidence of a pericardial effusion  Cath 01/22/2015 (Care Everywhere): Findings:  1. No flow-limiting coronary artery disease  2. Elevated left ventricular filling pressures (LVEPD = 18 mm Hg).  3. Normal left ventricular contractile function.   Recommendations:  1. Follow up with electrophysiologist to plan atrial fibrillation ablation   )       Anesthesia Quick Evaluation

## 2020-02-24 NOTE — Progress Notes (Signed)
Anesthesia Chart Review: Same day workup  Follows with cardiology for history of atrial fibrillation status post 2 ablations and in 02/2015 underwent convergent A. fib ablation at [facility].  Last seen by Dr. Irish Lack 01/22/2020, maintaining NSR at that time.  Per note, "Chops wood, raking leaves as well.  No problems with those activities.Marland KitchenMarland KitchenPreoperative cardiovascular eval: Has some trouble gripping steering wheel.  Wrist surgery- prolonged duration.  No further testing needed before surgery.  OK to hold Xarelto or 2 days prior to wrist surgery if needed."  Patient reported last dose of Xarelto on 02/21/2020  History of CKD 3.  We will need day of surgery labs and evaluation.  EKG 07/23/2019: Sinus rhythm.  Rate 62.  Limited echo 02/27/2015 (Care Everywhere):  Limited study to assess for pericardial effusion   Left ventricular hypertrophy - mild   Normal left ventricular systolic function, ejection fraction 55 to 60%   Reduced right ventricular systolic function   No evidence of a pericardial effusion  Cath 01/22/2015 (Care Everywhere): Findings:  1. No flow-limiting coronary artery disease  2. Elevated left ventricular filling pressures (LVEPD = 18 mm Hg).  3. Normal left ventricular contractile function.   Recommendations:  1. Follow up with electrophysiologist to plan atrial fibrillation ablation    [facility] Stay [facility]/Anesthesiology Phone [phone] 02/24/2020 9:38 AM

## 2020-02-25 ENCOUNTER — Other Ambulatory Visit: Payer: Self-pay

## 2020-02-25 ENCOUNTER — Inpatient Hospital Stay (HOSPITAL_COMMUNITY)
Admission: RE | Admit: 2020-02-25 | Discharge: 2020-02-27 | DRG: 041 | Disposition: A | Discharge status: Home / Self Care | Payer: Medicare Other | Attending: Orthopedic Surgery | Admitting: Orthopedic Surgery

## 2020-02-25 ENCOUNTER — Ambulatory Visit (HOSPITAL_COMMUNITY): Payer: Medicare Other | Admitting: Physician Assistant

## 2020-02-25 ENCOUNTER — Encounter (HOSPITAL_COMMUNITY)
Admission: RE | Disposition: A | Discharge status: Home / Self Care | Payer: Self-pay | Source: Home / Self Care | Attending: Orthopedic Surgery

## 2020-02-25 ENCOUNTER — Ambulatory Visit (HOSPITAL_COMMUNITY): Payer: Medicare Other

## 2020-02-25 ENCOUNTER — Encounter (HOSPITAL_COMMUNITY): Payer: Self-pay | Admitting: Orthopedic Surgery

## 2020-02-25 DIAGNOSIS — Z79890 Hormone replacement therapy (postmenopausal): Secondary | ICD-10-CM

## 2020-02-25 DIAGNOSIS — E785 Hyperlipidemia, unspecified: Secondary | ICD-10-CM | POA: Diagnosis present

## 2020-02-25 DIAGNOSIS — Z9119 Patient's noncompliance with other medical treatment and regimen: Secondary | ICD-10-CM

## 2020-02-25 DIAGNOSIS — G5641 Causalgia of right upper limb: Secondary | ICD-10-CM | POA: Diagnosis not present

## 2020-02-25 DIAGNOSIS — E039 Hypothyroidism, unspecified: Secondary | ICD-10-CM | POA: Diagnosis not present

## 2020-02-25 DIAGNOSIS — Z20822 Contact with and (suspected) exposure to covid-19: Secondary | ICD-10-CM | POA: Diagnosis present

## 2020-02-25 DIAGNOSIS — K219 Gastro-esophageal reflux disease without esophagitis: Secondary | ICD-10-CM | POA: Diagnosis present

## 2020-02-25 DIAGNOSIS — I480 Paroxysmal atrial fibrillation: Secondary | ICD-10-CM | POA: Diagnosis present

## 2020-02-25 DIAGNOSIS — M6588 Other synovitis and tenosynovitis, other site: Secondary | ICD-10-CM | POA: Diagnosis not present

## 2020-02-25 DIAGNOSIS — N183 Chronic kidney disease, stage 3 unspecified: Secondary | ICD-10-CM | POA: Diagnosis present

## 2020-02-25 DIAGNOSIS — Z888 Allergy status to other drugs, medicaments and biological substances status: Secondary | ICD-10-CM | POA: Diagnosis not present

## 2020-02-25 DIAGNOSIS — E1122 Type 2 diabetes mellitus with diabetic chronic kidney disease: Secondary | ICD-10-CM | POA: Diagnosis present

## 2020-02-25 DIAGNOSIS — G5621 Lesion of ulnar nerve, right upper limb: Secondary | ICD-10-CM | POA: Diagnosis not present

## 2020-02-25 DIAGNOSIS — M25331 Other instability, right wrist: Secondary | ICD-10-CM | POA: Diagnosis not present

## 2020-02-25 DIAGNOSIS — I5042 Chronic combined systolic (congestive) and diastolic (congestive) heart failure: Secondary | ICD-10-CM | POA: Diagnosis present

## 2020-02-25 DIAGNOSIS — Z8546 Personal history of malignant neoplasm of prostate: Secondary | ICD-10-CM

## 2020-02-25 DIAGNOSIS — E890 Postprocedural hypothyroidism: Secondary | ICD-10-CM | POA: Diagnosis present

## 2020-02-25 DIAGNOSIS — I130 Hypertensive heart and chronic kidney disease with heart failure and stage 1 through stage 4 chronic kidney disease, or unspecified chronic kidney disease: Secondary | ICD-10-CM | POA: Diagnosis present

## 2020-02-25 DIAGNOSIS — G4733 Obstructive sleep apnea (adult) (pediatric): Secondary | ICD-10-CM | POA: Diagnosis present

## 2020-02-25 DIAGNOSIS — Z7901 Long term (current) use of anticoagulants: Secondary | ICD-10-CM

## 2020-02-25 DIAGNOSIS — Z886 Allergy status to analgesic agent status: Secondary | ICD-10-CM

## 2020-02-25 DIAGNOSIS — Z79899 Other long term (current) drug therapy: Secondary | ICD-10-CM | POA: Diagnosis not present

## 2020-02-25 DIAGNOSIS — G5601 Carpal tunnel syndrome, right upper limb: Principal | ICD-10-CM | POA: Diagnosis present

## 2020-02-25 DIAGNOSIS — Z9079 Acquired absence of other genital organ(s): Secondary | ICD-10-CM

## 2020-02-25 DIAGNOSIS — M19031 Primary osteoarthritis, right wrist: Secondary | ICD-10-CM | POA: Diagnosis not present

## 2020-02-25 DIAGNOSIS — M810 Age-related osteoporosis without current pathological fracture: Secondary | ICD-10-CM | POA: Diagnosis present

## 2020-02-25 DIAGNOSIS — Z7984 Long term (current) use of oral hypoglycemic drugs: Secondary | ICD-10-CM | POA: Diagnosis not present

## 2020-02-25 HISTORY — PX: CARPAL TUNNEL WITH CUBITAL TUNNEL: SHX5608

## 2020-02-25 LAB — CBC
HCT: 40.9 % (ref 39.0–52.0)
Hemoglobin: 13 g/dL (ref 13.0–17.0)
MCH: 29.1 pg (ref 26.0–34.0)
MCHC: 31.8 g/dL (ref 30.0–36.0)
MCV: 91.5 fL (ref 80.0–100.0)
Platelets: 208 10*3/uL (ref 150–400)
RBC: 4.47 MIL/uL (ref 4.22–5.81)
RDW: 14.8 % (ref 11.5–15.5)
WBC: 7.1 10*3/uL (ref 4.0–10.5)
nRBC: 0 % (ref 0.0–0.2)

## 2020-02-25 LAB — GLUCOSE, CAPILLARY
Glucose-Capillary: 127 mg/dL — ABNORMAL HIGH (ref 70–99)
Glucose-Capillary: 96 mg/dL (ref 70–99)
Glucose-Capillary: 129 mg/dL — ABNORMAL HIGH (ref 70–99)
Glucose-Capillary: 97 mg/dL (ref 70–99)

## 2020-02-25 LAB — BASIC METABOLIC PANEL
Anion gap: 12 (ref 5–15)
BUN: 18 mg/dL (ref 8–23)
CO2: 20 mmol/L — ABNORMAL LOW (ref 22–32)
Calcium: 9.1 mg/dL (ref 8.9–10.3)
Chloride: 106 mmol/L (ref 98–111)
Creatinine, Ser: 1.04 mg/dL (ref 0.61–1.24)
GFR, Estimated: >60 mL/min (ref >60)
Glucose, Bld: 98 mg/dL (ref 70–99)
Potassium: 3.8 mmol/L (ref 3.5–5.1)
Sodium: 138 mmol/L (ref 135–145)

## 2020-02-25 SURGERY — RELEASE, CARPAL TUNNEL AND CUBITAL TUNNEL
Anesthesia: Monitor Anesthesia Care | Site: Wrist | Laterality: Right

## 2020-02-25 MED ORDER — ONDANSETRON HCL 4 MG/2ML IJ SOLN
4.0000 mg | Freq: Four times a day (QID) | INTRAMUSCULAR | Status: DC | PRN
  Administered 2020-02-27: 4 mg via INTRAVENOUS
  Filled 2020-02-25: qty 2

## 2020-02-25 MED ORDER — PROPOFOL 10 MG/ML IV BOLUS
INTRAVENOUS | Status: DC | PRN
  Administered 2020-02-25: 10 mg via INTRAVENOUS
  Administered 2020-02-25: 30 mg via INTRAVENOUS
  Administered 2020-02-25: 10 mg via INTRAVENOUS

## 2020-02-25 MED ORDER — ACETAMINOPHEN 500 MG PO TABS
1000.0000 mg | ORAL_TABLET | Freq: Once | ORAL | Status: AC
  Administered 2020-02-25: 1000 mg via ORAL
  Filled 2020-02-25: qty 2

## 2020-02-25 MED ORDER — CLONIDINE HCL (ANALGESIA) 100 MCG/ML EP SOLN
EPIDURAL | Status: DC | PRN
  Administered 2020-02-25: 50 ug

## 2020-02-25 MED ORDER — STERILE WATER FOR IRRIGATION IR SOLN
Status: DC | PRN
  Administered 2020-02-25: 1000 mL

## 2020-02-25 MED ORDER — CHLORHEXIDINE GLUCONATE 0.12 % MT SOLN
15.0000 mL | Freq: Once | OROMUCOSAL | Status: AC
  Administered 2020-02-25: 15 mL via OROMUCOSAL
  Filled 2020-02-25: qty 15

## 2020-02-25 MED ORDER — LEVOTHYROXINE SODIUM 75 MCG PO TABS
175.0000 ug | ORAL_TABLET | Freq: Every day | ORAL | Status: DC
  Administered 2020-02-26 – 2020-02-27 (×2): 175 ug via ORAL
  Filled 2020-02-25 (×2): qty 1

## 2020-02-25 MED ORDER — ADULT MULTIVITAMIN W/MINERALS CH
1.0000 | ORAL_TABLET | Freq: Every day | ORAL | Status: DC
  Administered 2020-02-25 – 2020-02-27 (×3): 1 via ORAL
  Filled 2020-02-25 (×3): qty 1

## 2020-02-25 MED ORDER — POTASSIUM CHLORIDE CRYS ER 20 MEQ PO TBCR
40.0000 meq | EXTENDED_RELEASE_TABLET | Freq: Two times a day (BID) | ORAL | Status: DC
  Administered 2020-02-25 – 2020-02-27 (×4): 40 meq via ORAL
  Filled 2020-02-25 (×4): qty 2

## 2020-02-25 MED ORDER — ONDANSETRON HCL 4 MG PO TABS
4.0000 mg | ORAL_TABLET | Freq: Four times a day (QID) | ORAL | Status: DC | PRN
  Administered 2020-02-26: 4 mg via ORAL
  Filled 2020-02-25: qty 1

## 2020-02-25 MED ORDER — METHOCARBAMOL 1000 MG/10ML IJ SOLN
500.0000 mg | Freq: Four times a day (QID) | INTRAVENOUS | Status: DC | PRN
  Filled 2020-02-25: qty 5

## 2020-02-25 MED ORDER — FENTANYL CITRATE (PF) 250 MCG/5ML IJ SOLN
INTRAMUSCULAR | Status: AC
  Filled 2020-02-25: qty 5

## 2020-02-25 MED ORDER — ACETAMINOPHEN 500 MG PO TABS
1000.0000 mg | ORAL_TABLET | Freq: Four times a day (QID) | ORAL | Status: AC
  Administered 2020-02-25 – 2020-02-26 (×4): 1000 mg via ORAL
  Filled 2020-02-25 (×4): qty 2

## 2020-02-25 MED ORDER — ROSUVASTATIN CALCIUM 5 MG PO TABS
10.0000 mg | ORAL_TABLET | Freq: Every day | ORAL | Status: DC
  Administered 2020-02-25 – 2020-02-27 (×3): 10 mg via ORAL
  Filled 2020-02-25 (×3): qty 2

## 2020-02-25 MED ORDER — SEMAGLUTIDE(0.25 OR 0.5MG/DOS) 2 MG/1.5ML ~~LOC~~ SOPN: 0.5000 mg | PEN_INJECTOR | SUBCUTANEOUS | Status: DC

## 2020-02-25 MED ORDER — METHOCARBAMOL 500 MG PO TABS
500.0000 mg | ORAL_TABLET | Freq: Four times a day (QID) | ORAL | Status: DC | PRN
  Administered 2020-02-26 (×2): 500 mg via ORAL
  Filled 2020-02-25 (×2): qty 1

## 2020-02-25 MED ORDER — DOCUSATE SODIUM 100 MG PO CAPS
100.0000 mg | ORAL_CAPSULE | Freq: Two times a day (BID) | ORAL | Status: DC
  Administered 2020-02-25 – 2020-02-27 (×4): 100 mg via ORAL
  Filled 2020-02-25 (×4): qty 1

## 2020-02-25 MED ORDER — PHENYLEPHRINE 40 MCG/ML (10ML) SYRINGE FOR IV PUSH (FOR BLOOD PRESSURE SUPPORT)
PREFILLED_SYRINGE | INTRAVENOUS | Status: DC | PRN
  Administered 2020-02-25: 80 ug via INTRAVENOUS

## 2020-02-25 MED ORDER — LACTATED RINGERS IV SOLN: INTRAVENOUS | Status: DC

## 2020-02-25 MED ORDER — ONDANSETRON HCL 4 MG/2ML IJ SOLN
INTRAMUSCULAR | Status: DC | PRN
  Administered 2020-02-25: 4 mg via INTRAVENOUS

## 2020-02-25 MED ORDER — MIDAZOLAM HCL 2 MG/2ML IJ SOLN: 2.0000 mg | INTRAMUSCULAR | Status: AC

## 2020-02-25 MED ORDER — DULOXETINE HCL 30 MG PO CPEP
30.0000 mg | ORAL_CAPSULE | ORAL | Status: DC
  Administered 2020-02-26 – 2020-02-27 (×2): 30 mg via ORAL
  Filled 2020-02-25 (×2): qty 1

## 2020-02-25 MED ORDER — HYDROMORPHONE HCL 1 MG/ML IJ SOLN: 0.2500 mg | INTRAMUSCULAR | Status: DC | PRN

## 2020-02-25 MED ORDER — ASCORBIC ACID 500 MG PO TABS
1000.0000 mg | ORAL_TABLET | Freq: Every day | ORAL | Status: DC
  Administered 2020-02-25 – 2020-02-27 (×3): 1000 mg via ORAL
  Filled 2020-02-25 (×3): qty 2

## 2020-02-25 MED ORDER — IMIPRAMINE HCL 25 MG PO TABS
50.0000 mg | ORAL_TABLET | Freq: Every day | ORAL | Status: DC
  Administered 2020-02-25 – 2020-02-26 (×2): 50 mg via ORAL
  Filled 2020-02-25 (×4): qty 2

## 2020-02-25 MED ORDER — BUPIVACAINE HCL (PF) 0.25 % IJ SOLN
INTRAMUSCULAR | Status: DC | PRN
  Administered 2020-02-25: 30 mL

## 2020-02-25 MED ORDER — 0.9 % SODIUM CHLORIDE (POUR BTL) OPTIME
TOPICAL | Status: DC | PRN
  Administered 2020-02-25: 1000 mL

## 2020-02-25 MED ORDER — PANTOPRAZOLE SODIUM 40 MG PO TBEC
40.0000 mg | DELAYED_RELEASE_TABLET | Freq: Every day | ORAL | Status: DC
  Administered 2020-02-26 – 2020-02-27 (×2): 40 mg via ORAL
  Filled 2020-02-25 (×2): qty 1

## 2020-02-25 MED ORDER — FENTANYL CITRATE (PF) 100 MCG/2ML IJ SOLN
INTRAMUSCULAR | Status: AC
  Administered 2020-02-25: 100 ug via INTRAVENOUS
  Filled 2020-02-25: qty 2

## 2020-02-25 MED ORDER — FENTANYL CITRATE (PF) 100 MCG/2ML IJ SOLN
INTRAMUSCULAR | Status: AC
  Administered 2020-02-25: 50 ug via INTRAVENOUS
  Filled 2020-02-25: qty 2

## 2020-02-25 MED ORDER — FENTANYL CITRATE (PF) 100 MCG/2ML IJ SOLN: 50.0000 ug | Freq: Once | INTRAMUSCULAR | Status: AC

## 2020-02-25 MED ORDER — LOSARTAN POTASSIUM 50 MG PO TABS
50.0000 mg | ORAL_TABLET | Freq: Every day | ORAL | Status: DC
  Administered 2020-02-25 – 2020-02-27 (×3): 50 mg via ORAL
  Filled 2020-02-25 (×3): qty 1

## 2020-02-25 MED ORDER — CEFAZOLIN SODIUM-DEXTROSE 2-4 GM/100ML-% IV SOLN
2.0000 g | INTRAVENOUS | Status: AC
  Administered 2020-02-25: 2 g via INTRAVENOUS
  Filled 2020-02-25: qty 100

## 2020-02-25 MED ORDER — HYDROMORPHONE HCL 2 MG PO TABS
4.0000 mg | ORAL_TABLET | ORAL | Status: DC | PRN
  Administered 2020-02-26 (×2): 4 mg via ORAL
  Filled 2020-02-25 (×2): qty 2

## 2020-02-25 MED ORDER — OXYCODONE HCL 5 MG PO TABS
10.0000 mg | ORAL_TABLET | ORAL | Status: DC | PRN
  Administered 2020-02-26 – 2020-02-27 (×3): 15 mg via ORAL
  Filled 2020-02-25 (×4): qty 3
  Filled 2020-02-25: qty 2

## 2020-02-25 MED ORDER — OXYCODONE HCL 5 MG PO TABS
5.0000 mg | ORAL_TABLET | ORAL | Status: DC | PRN
  Administered 2020-02-26: 5 mg via ORAL
  Filled 2020-02-25: qty 1

## 2020-02-25 MED ORDER — CEFAZOLIN SODIUM-DEXTROSE 1-4 GM/50ML-% IV SOLN
1.0000 g | Freq: Three times a day (TID) | INTRAVENOUS | Status: DC
  Administered 2020-02-26 – 2020-02-27 (×4): 1 g via INTRAVENOUS
  Filled 2020-02-25 (×4): qty 50

## 2020-02-25 MED ORDER — MIDAZOLAM HCL 2 MG/2ML IJ SOLN
INTRAMUSCULAR | Status: AC
  Administered 2020-02-25: 2 mg via INTRAVENOUS
  Filled 2020-02-25: qty 2

## 2020-02-25 MED ORDER — MIDAZOLAM HCL 2 MG/2ML IJ SOLN: 2.0000 mg | Freq: Once | INTRAMUSCULAR | Status: AC

## 2020-02-25 MED ORDER — CEFAZOLIN SODIUM-DEXTROSE 1-4 GM/50ML-% IV SOLN
1.0000 g | INTRAVENOUS | Status: AC
  Administered 2020-02-25: 1 g via INTRAVENOUS
  Filled 2020-02-25: qty 50

## 2020-02-25 MED ORDER — HYDROMORPHONE HCL 1 MG/ML IJ SOLN
0.5000 mg | INTRAMUSCULAR | Status: DC | PRN
  Administered 2020-02-26: 0.5 mg via INTRAVENOUS
  Filled 2020-02-25 (×2): qty 1

## 2020-02-25 MED ORDER — METFORMIN HCL ER 500 MG PO TB24
1000.0000 mg | ORAL_TABLET | Freq: Two times a day (BID) | ORAL | Status: DC
  Administered 2020-02-26 – 2020-02-27 (×3): 1000 mg via ORAL
  Filled 2020-02-25 (×3): qty 2

## 2020-02-25 MED ORDER — FENTANYL CITRATE (PF) 100 MCG/2ML IJ SOLN: 100.0000 ug | INTRAMUSCULAR | Status: AC

## 2020-02-25 MED ORDER — BUPIVACAINE-EPINEPHRINE (PF) 0.5% -1:200000 IJ SOLN
INTRAMUSCULAR | Status: DC | PRN
  Administered 2020-02-25: 10 mL via PERINEURAL
  Administered 2020-02-25: 30 mL via PERINEURAL

## 2020-02-25 MED ORDER — PROPOFOL 500 MG/50ML IV EMUL
INTRAVENOUS | Status: DC | PRN
  Administered 2020-02-25: 50 ug/kg/min via INTRAVENOUS

## 2020-02-25 MED ORDER — ACETAMINOPHEN 325 MG PO TABS: 325.0000 mg | ORAL_TABLET | Freq: Four times a day (QID) | ORAL | Status: DC | PRN

## 2020-02-25 MED ORDER — ORAL CARE MOUTH RINSE: 15.0000 mL | Freq: Once | OROMUCOSAL | Status: AC

## 2020-02-25 MED ORDER — EMPAGLIFLOZIN 25 MG PO TABS
25.0000 mg | ORAL_TABLET | Freq: Every day | ORAL | Status: DC
  Administered 2020-02-26 – 2020-02-27 (×2): 25 mg via ORAL
  Filled 2020-02-25 (×2): qty 1

## 2020-02-25 SURGICAL SUPPLY — 50 items
BLADE LONG MED 31MMX9MM (MISCELLANEOUS) ×1
BLADE LONG MED 31X9 (MISCELLANEOUS) ×2 IMPLANT
BLADE SURG 15 STRL LF DISP TIS (BLADE) ×3 IMPLANT
BLADE SURG 15 STRL SS (BLADE) ×9
BNDG ELASTIC 3X5.8 VLCR STR LF (GAUZE/BANDAGES/DRESSINGS) ×6 IMPLANT
BNDG ELASTIC 4X5.8 VLCR STR LF (GAUZE/BANDAGES/DRESSINGS) ×6 IMPLANT
BNDG ESMARK 4X9 LF (GAUZE/BANDAGES/DRESSINGS) ×3 IMPLANT
BNDG GAUZE ELAST 4 BULKY (GAUZE/BANDAGES/DRESSINGS) ×3 IMPLANT
CORD BIPOLAR FORCEPS 12FT (ELECTRODE) ×3 IMPLANT
COVER SURGICAL LIGHT HANDLE (MISCELLANEOUS) ×6 IMPLANT
CUFF TOURN SGL QUICK 18X4 (TOURNIQUET CUFF) ×3 IMPLANT
DRAPE INCISE IOBAN 66X45 STRL (DRAPES) ×3 IMPLANT
DRAPE OEC MINIVIEW 54X84 (DRAPES) ×3 IMPLANT
DRAPE SURG 17X23 STRL (DRAPES) ×3 IMPLANT
DRSG ADAPTIC 3X8 NADH LF (GAUZE/BANDAGES/DRESSINGS) ×3 IMPLANT
GAUZE SPONGE 4X4 12PLY STRL (GAUZE/BANDAGES/DRESSINGS) ×3 IMPLANT
GAUZE XEROFORM 1X8 LF (GAUZE/BANDAGES/DRESSINGS) ×3 IMPLANT
GLOVE SS BIOGEL STRL SZ 8 (GLOVE) ×2 IMPLANT
GLOVE SUPERSENSE BIOGEL SZ 8 (GLOVE) ×4
GOWN STRL REUS W/ TWL XL LVL3 (GOWN DISPOSABLE) ×2 IMPLANT
GOWN STRL REUS W/TWL XL LVL3 (GOWN DISPOSABLE) ×6
IMPL CAPITATE TAPR POST 7.5 (Orthopedic Implant) ×1 IMPLANT
IMPL CAPITATE W 15X22X23 (Orthopedic Implant) ×1 IMPLANT
IMPLANT CAPITATE TAPR POST 7.5 (Orthopedic Implant) ×3 IMPLANT
IMPLANT CAPITATE W 15X22X23 (Orthopedic Implant) ×3 IMPLANT
KIT BASIN OR (CUSTOM PROCEDURE TRAY) ×3 IMPLANT
KIT TURNOVER KIT B (KITS) ×3 IMPLANT
NS IRRIG 1000ML POUR BTL (IV SOLUTION) ×3 IMPLANT
PACK ORTHO EXTREMITY (CUSTOM PROCEDURE TRAY) ×3 IMPLANT
PAD ARMBOARD 7.5X6 YLW CONV (MISCELLANEOUS) ×6 IMPLANT
PAD CAST 3X4 CTTN HI CHSV (CAST SUPPLIES) ×1 IMPLANT
PAD CAST 4YDX4 CTTN HI CHSV (CAST SUPPLIES) ×3 IMPLANT
PADDING CAST COTTON 3X4 STRL (CAST SUPPLIES) ×3
PADDING CAST COTTON 4X4 STRL (CAST SUPPLIES) ×9
PILLOW ARM CARTER ADULT (MISCELLANEOUS) ×3 IMPLANT
SLING ARM FOAM STRAP XLG (SOFTGOODS) ×3 IMPLANT
SOL PREP POV-IOD 4OZ 10% (MISCELLANEOUS) ×3 IMPLANT
SPLINT FIBERGLASS 4X30 (CAST SUPPLIES) ×3 IMPLANT
STOCKINETTE TUBULAR SYNTH 3IN (CAST SUPPLIES) ×3 IMPLANT
SUCTION FRAZIER HANDLE 10FR (MISCELLANEOUS) ×3
SUCTION TUBE FRAZIER 10FR DISP (MISCELLANEOUS) ×1 IMPLANT
SUT FIBERWIRE 3-0 18 TAPR NDL (SUTURE) ×6
SUT PROLENE 3 0 PS 2 (SUTURE) ×3 IMPLANT
SUT PROLENE 4 0 PS 2 18 (SUTURE) ×9 IMPLANT
SUTURE FIBERWR 3-0 18 TAPR NDL (SUTURE) ×2 IMPLANT
TOWEL GREEN STERILE (TOWEL DISPOSABLE) ×3 IMPLANT
TOWEL GREEN STERILE FF (TOWEL DISPOSABLE) ×3 IMPLANT
TUBE CONNECTING 12'X1/4 (SUCTIONS) ×1
TUBE CONNECTING 12X1/4 (SUCTIONS) ×2 IMPLANT
WATER STERILE IRR 1000ML POUR (IV SOLUTION) ×3 IMPLANT

## 2020-02-25 NOTE — Transfer of Care (Signed)
Immediate Anesthesia Transfer of Care Note  Patient: [name] [name]  Procedure(s) Performed: RIGHT CARPAL TUNNEL RELEASE, CUBITAL TUNNEL RELEASE IN SITU, RIGHT WRIST PROXIMAL ROW CARPECTOMY AND PROXIMAL CAPITATE HEAD REPLACEMENT/HEMIARTHROPLASTY WITH POSTERIOR INTEROSSOUS NERVE NEURECTOMY AND REPAIR (Right Wrist)  Patient Location: PACU  Anesthesia Type:MAC combined with regional for post-op pain  Level of Consciousness: awake, alert , oriented and patient cooperative  Airway & Oxygen Therapy: Patient Spontanous Breathing  Post-op Assessment: Report given to RN, Post -op Vital signs reviewed and stable and Patient moving all extremities X 4  Post vital signs: Reviewed and stable  Last Vitals:  Vitals Value Taken Time  BP 113/60   Temp    Pulse 58 02/25/20 1746  Resp 14 02/25/20 1746  SpO2 92 % 02/25/20 1746  Vitals shown include unvalidated device data.  Last Pain:  Vitals:   02/25/20 1251  TempSrc:   PainSc: 0-No pain         Complications: No complications documented.

## 2020-02-25 NOTE — H&P (Signed)
[name] [name] is an 69 y.o. male.   Chief Complaint: Right wrist arthritis.  Right carpal tunnel syndrome.  Right cubital tunnel syndrome. HPI: Patient presents for surgical evaluation and management.  We will plan for right carpal tunnel release.  Right cubital tunnel release.  Right capitate head arthroplasty with PIN neurectomy and repair reconstruction is necessary.  Patient presents for evaluation and treatment of the of their upper extremity predicament. The patient denies neck, back, chest or  abdominal pain. The patient notes that they have no lower extremity problems. The patients primary complaint is noted. We are planning surgical care pathway for the upper extremity.  Past Medical History:  Diagnosis Date  . Arthritis    OA  . Atrial flutter ([place])    a. s/p RFCA 8/13  . Cancer [facility]) 2010   prostate  . Chronic combined systolic and diastolic CHF (congestive heart failure) ([place])    a. LVEF previously 35% felt to be due tachycardia;  b. 02/2013 Echo: EF 50-55%, no rwma, mildly dil LA/RA, mild to mod MR. C 11/2014 echo - ef 60-65%, unable to determine DD  . CKD (chronic kidney disease), stage III (HCC)    hx of a/c renal failure during episode of diverticulitis 9/13 in [place], [place]  . Diabetes mellitus ([place])    TYPE 2   . Diverticulitis   . Diverticulosis   . Dyslipidemia   . Erectile dysfunction   . Gastric ulcer    s/p prior surgery  . GERD (gastroesophageal reflux disease)   . Headache   . History of colonic diverticulitis   . History of prostate cancer   . Hypertension   . Hypothyroid   . Microcytic anemia   . Obesities, morbid ([place])   . Obstructive sleep apnea    noncompliant with CPAP  . Osteoporosis   . PAF (paroxysmal atrial fibrillation) ([place])    a. failed DCCV and multiple anti-arrhythmic drugs (multaq/flecainide);   b. s/p  PVI isolation with ablation of AFib 8/13; c. repeat PVI 01/2013;  d. 02/2013 repeat DCCV->Amio load/xarelto.  . Peptic ulcer disease  1994  . Renal calculi   . Sleep apnea 2015   has not had sleep apnea since heart intervention  . Thrombocytopenia ([place]) 1992   idopathic-treated with danazol  . Tubular adenoma of colon     Past Surgical History:  Procedure Laterality Date  . ATRIAL FIBRILLATION ABLATION  12/06/11; 02/05/2013   Afib and atrial flutter ablation by Dr Rayann Heman; repeat PVI by Dr Rayann Heman 02/05/2013  . ATRIAL FIBRILLATION ABLATION N/A 12/06/2011   Procedure: ATRIAL FIBRILLATION ABLATION;  Surgeon: Thompson Grayer, MD;  Location: [facility] CATH LAB;  Service: Cardiovascular;  Laterality: N/A;  . ATRIAL FIBRILLATION ABLATION N/A 02/05/2013   Procedure: ATRIAL FIBRILLATION ABLATION;  Surgeon: Coralyn Mark, MD;  Location: [place] CATH LAB;  Service: Cardiovascular;  Laterality: N/A;  . benign stomach tumor removal  2000  . CARDIOVERSION  08/25/2011   Procedure: CARDIOVERSION;  Surgeon: Jettie Booze, MD;  Location: [place];  Service: Cardiovascular;  Laterality: N/A;  . CARDIOVERSION N/A 03/05/2013   Procedure: CARDIOVERSION;  Surgeon: Sinclair Grooms, MD;  Location: [place];  Service: Cardiovascular;  Laterality: N/A;  BEDSIDE   . convergent afib abaltion [facility] 02/26/15  02/26/15   [facility] by Dr. Lehman Prom and Dr. Danise Mina  . ELECTROPHYSIOLOGIC STUDY N/A 11/24/2014   Procedure: Cardioversion;  Surgeon: Will Meredith Leeds, MD;  Location: [place] CV LAB;  Service: Cardiovascular;  Laterality: N/A;  .  ESOPHAGOGASTRODUODENOSCOPY N/A 04/09/2013   Procedure: ESOPHAGOGASTRODUODENOSCOPY (EGD) with possible Balloon Dilation;  Surgeon: Garlan Fair, MD;  Location: [facility] ENDOSCOPY;  Service: Endoscopy;  Laterality: N/A;  . kidney stone removal     multiple  . KNEE ARTHROSCOPY Bilateral 1979  . LAPAROSCOPIC RETROPUBIC PROSTATECTOMY  02/2007   hx prostate cancer  . ORIF ANKLE FRACTURE Right 10/2015  . ORIF ANKLE FRACTURE Right 11/03/2015   Procedure: ORIF RIGHT LISFRANK FOOT;  Surgeon: Newt Minion, MD;  Location: [place];   Service: Orthopedics;  Laterality: Right;  . TEE WITHOUT CARDIOVERSION  08/25/2011   Procedure: TRANSESOPHAGEAL ECHOCARDIOGRAM (TEE);  Surgeon: Jettie Booze, MD;  Location: [place];  Service: Cardiovascular;  Laterality: N/A;  . TEE WITHOUT CARDIOVERSION  11/09/2011   Procedure: TRANSESOPHAGEAL ECHOCARDIOGRAM (TEE);  Surgeon: Jettie Booze, MD;  Location: [facility] ENDOSCOPY;  Service: Cardiovascular;  Laterality: N/A;  h/p in file drawer  . TEE WITHOUT CARDIOVERSION N/A 02/05/2013   Procedure: TRANSESOPHAGEAL ECHOCARDIOGRAM (TEE);  Surgeon: Candee Furbish, MD;  Location: [facility] ENDOSCOPY;  Service: Cardiovascular;  Laterality: N/A;  . TOTAL THYROIDECTOMY  2009   benign nodules removed  . WRIST SURGERY Right 1977   with placement of lunate prosthesis    Family History  Problem Relation Age of Onset  . Emphysema Mother   . Lung cancer Father   . Diabetes Brother   . Atrial fibrillation Brother   . Throat cancer Sister   . Lung cancer Sister   . Colon cancer Neg Hx    Social History:  reports that he has never smoked. He has never used smokeless tobacco. He reports that he does not drink alcohol and does not use drugs.  Allergies:  Allergies  Allergen Reactions  . Phenergan [Promethazine Hcl] Itching    Hallucination   . Aspirin     Hx of ulcer   . Bystolic [Nebivolol Hcl] Swelling    Bradycardia   . Calcium Channel Blockers Swelling    LE edema  . Prednisone     Hx of ulcer  . Nsaids     Hx ulcer    Facility-Administered Medications Prior to Admission  Medication Dose Route Frequency Provider Last Rate Last Admin  . 0.9 %  sodium chloride infusion  500 mL Intravenous Once Pyrtle, Lajuan Lines, MD       Medications Prior to Admission  Medication Sig Dispense Refill  . acetaminophen (TYLENOL) 500 MG tablet Take 1,000 mg by mouth every 6 (six) hours as needed for moderate pain or headache.    . bismuth subsalicylate (PEPTO BISMOL) 262 MG/15ML suspension Take 30 mLs by  mouth every 6 (six) hours as needed for indigestion or diarrhea or loose stools.    . Ca Carbonate-Mag Hydroxide (ROLAIDS PO) Take 2 tablets by mouth daily as needed (heartburn).    . carvedilol (COREG) 25 MG tablet Take 1 tablet (25 mg total) by mouth daily. In the evening (Patient taking differently: Take 25 mg by mouth 2 (two) times daily with a meal. ) 90 tablet 3  . doxycycline (VIBRAMYCIN) 100 MG capsule Take 100 mg by mouth 2 (two) times daily.    . DULoxetine (CYMBALTA) 30 MG capsule Take 30 mg by mouth every morning.     Marland Kitchen esomeprazole (NEXIUM) 40 MG capsule Take 40 mg by mouth daily.     Marland Kitchen FOLIC ACID PO Take 1 tablet by mouth daily.    Marland Kitchen GLUCOSAMINE PO Take 1 tablet by mouth 2 (two) times daily.     Marland Kitchen  imipramine (TOFRANIL) 25 MG tablet Take 50 mg by mouth at bedtime.     Marland Kitchen JARDIANCE 25 MG TABS tablet Take 25 mg by mouth daily.    Marland Kitchen levothyroxine (SYNTHROID) 175 MCG tablet Take 175 mcg by mouth daily.    Marland Kitchen losartan (COZAAR) 50 MG tablet Take 50 mg by mouth daily.    Marland Kitchen LYSINE PO Take 1 tablet by mouth daily.    . Meclizine HCl (BONINE PO) Take 1 tablet by mouth daily as needed (nausea).    . metFORMIN (GLUCOPHAGE-XR) 500 MG 24 hr tablet Take 1,000 mg by mouth 2 (two) times daily.    . Multiple Vitamins-Minerals (MENS 50+ MULTI VITAMIN/MIN PO) Take 1 tablet by mouth daily.    . Multiple Vitamins-Minerals (PRESERVISION AREDS 2) CAPS Take 1 capsule by mouth 2 (two) times daily.    . ondansetron (ZOFRAN) 4 MG tablet Take 4 mg by mouth 2 (two) times daily as needed for nausea.     Marland Kitchen OZEMPIC, 0.25 OR 0.5 MG/DOSE, 2 MG/1.5ML SOPN Inject 0.5 mg into the skin every Sunday.     . polycarbophil (FIBERCON) 625 MG tablet Take 1,250 mg by mouth daily.    . potassium chloride SA (K-DUR,KLOR-CON) 20 MEQ tablet Take 2 tablets (40 mEq total) by mouth daily. (Patient taking differently: Take 40 mEq by mouth 2 (two) times daily. ) 180 tablet 1  . rivaroxaban (XARELTO) 20 MG TABS tablet Take 1 tablet (20 mg  total) by mouth daily with supper. 90 tablet 1  . rosuvastatin (CRESTOR) 10 MG tablet Take 1 tablet (10 mg total) by mouth daily. 90 tablet 3  . Cyanocobalamin (B-12 PO) Take 1 tablet by mouth daily.    . tamsulosin (FLOMAX) 0.4 MG CAPS capsule Take 0.4 mg by mouth daily as needed (kidney stones).       Results for orders placed or performed during the hospital encounter of 02/25/20 (from the past 48 hour(s))  Basic metabolic panel per protocol     Status: Abnormal   Collection Time: 02/25/20 10:44 AM  Result Value Ref Range   Sodium 138 135 - 145 mmol/L   Potassium 3.8 3.5 - 5.1 mmol/L   Chloride 106 98 - 111 mmol/L   CO2 20 (L) 22 - 32 mmol/L   Glucose, Bld 98 70 - 99 mg/dL    Comment: Glucose reference range applies only to samples taken after fasting for at least 8 hours.   BUN 18 8 - 23 mg/dL   Creatinine, Ser 1.04 0.61 - 1.24 mg/dL   Calcium 9.1 8.9 - 10.3 mg/dL   GFR, Estimated >60 >60 mL/min    Comment: (NOTE) Calculated using the CKD-EPI Creatinine Equation (2021)    Anion gap 12 5 - 15    Comment: Performed at [facility] [address], [place], [place] [address]  CBC per protocol     Status: None   Collection Time: 02/25/20 10:44 AM  Result Value Ref Range   WBC 7.1 4.0 - 10.5 K/uL   RBC 4.47 4.22 - 5.81 MIL/uL   Hemoglobin 13.0 13.0 - 17.0 g/dL   HCT 40.9 39 - 52 %   MCV 91.5 80.0 - 100.0 fL   MCH 29.1 26.0 - 34.0 pg   MCHC 31.8 30.0 - 36.0 g/dL   RDW 14.8 11.5 - 15.5 %   Platelets 208 150 - 400 K/uL   nRBC 0.0 0.0 - 0.2 %    Comment: Performed at [facility]  [facility], [address] [place], [place] [address]  Glucose, capillary     Status: Abnormal   Collection Time: 02/25/20 10:58 AM  Result Value Ref Range   Glucose-Capillary 127 (H) 70 - 99 mg/dL    Comment: Glucose reference range applies only to samples taken after fasting for at least 8 hours.  Glucose, capillary     Status: None   Collection Time: 02/25/20 12:55 PM  Result Value Ref Range    Glucose-Capillary 96 70 - 99 mg/dL    Comment: Glucose reference range applies only to samples taken after fasting for at least 8 hours.  Glucose, capillary     Status: Abnormal   Collection Time: 02/25/20  2:48 PM  Result Value Ref Range   Glucose-Capillary 129 (H) 70 - 99 mg/dL    Comment: Glucose reference range applies only to samples taken after fasting for at least 8 hours.   DG MINI C-ARM IMAGE ONLY  Result Date: 02/25/2020 There is no interpretation for this exam.  This order is for images obtained during a surgical procedure.  Please See "Surgeries" Tab for more information regarding the procedure.    Review of Systems  Respiratory: Negative.   Cardiovascular: Negative.   Gastrointestinal: Negative.   Genitourinary: Negative.     Blood pressure 123/71, pulse 70, temperature 97.6 F (36.4 C), temperature source Oral, resp. rate 14, height 5\' 6"  (1.676 m), weight 99.8 kg, SpO2 99 %. Physical Exam  Loss of motion pain and arthritis about the right wrist secondary to osteoarthritic issues with history of lunate replacement in the distant past with failure and subsequent still concerned about this.  Patient has marked disarray of the wrist in terms of the bony architecture and arthritic sequelae.  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  I reviewed this with patient at length and the findings.  The patient is alert and oriented in no acute distress. The patient complains of pain in the affected upper extremity.  The patient is noted to have a normal HEENT exam. Lung fields show equal chest expansion and no shortness of breath. Abdomen exam is nontender without distention. Lower extremity examination does not show any fracture dislocation or blood clot symptoms. Pelvis is stable and the neck and back are stable and nontender. Assessment/Plan We will plan to proceed with right carpal and  cubital tunnel release and associated wrist reconstruction in the form of a capitate head arthroplasty with PIN neurectomy and EPL transfer with repair is necessary.  We are planning surgery for your upper extremity. The risk and benefits of surgery to include risk of bleeding, infection, anesthesia,  damage to normal structures and failure of the surgery to accomplish its intended goals of relieving symptoms and restoring function have been discussed in detail. With this in mind we plan to proceed. I have specifically discussed with the patient the pre-and postoperative regime and the dos and don'ts and risk and benefits in great detail. Risk and benefits of surgery also include risk of dystrophy(CRPS), chronic nerve pain, failure of the healing process to go onto completion and other inherent risks of surgery The relavent the pathophysiology of the disease/injury process, as well as the alternatives for treatment and postoperative course of action has been discussed in great detail with the patient who desires to proceed.  We will do everything in our power to help you (the patient) restore function to the upper extremity. It  is a pleasure to see this patient today.   Willa Frater III, MD 02/25/2020, 2:55 PM

## 2020-02-25 NOTE — Anesthesia Procedure Notes (Signed)
Anesthesia Regional Block: Axillary brachial plexus block   Pre-Anesthetic Checklist: ,, timeout performed, Correct Patient, Correct Site, Correct Laterality, Correct Procedure, Correct Position, site marked, Risks and benefits discussed, pre-op evaluation,  At surgeon's request and post-op pain management  Laterality: Right  Prep: Maximum Sterile Barrier Precautions used, chloraprep       Needles:  Injection technique: Single-shot  Needle Type: Other     Needle Length: 9cm  Needle Gauge: 21     Additional Needles:   Procedures:,,,, ultrasound used (permanent image in chart),,,,  Narrative:  Start time: 02/25/2020 1:44 PM End time: 02/25/2020 1:54 PM Injection made incrementally with aspirations every 5 mL.  Additional Notes: 2% Lidocaine skin wheel. Patient with some motor block after supraclavicular block but I would expect a more dense block. Discussed with patient that this is unexpected and that I would like to supplement the block to try to achieve arm numbness. Pt agrees.to an axillary block to supplement.

## 2020-02-25 NOTE — Anesthesia Procedure Notes (Addendum)
Procedure Name: MAC Date/Time: 02/25/2020 3:08 PM Performed by: Rande Brunt, CRNA Pre-anesthesia Checklist: Patient identified, Emergency Drugs available, Suction available and Patient being monitored Patient Re-evaluated:Patient Re-evaluated prior to induction Oxygen Delivery Method: Simple face mask Induction Type: IV induction Placement Confirmation: positive ETCO2 and breath sounds checked- equal and bilateral Dental Injury: Teeth and Oropharynx as per pre-operative assessment

## 2020-02-25 NOTE — Plan of Care (Signed)
  Problem: Education: Goal: Knowledge of General Education information will improve Description Including pain rating scale, medication(s)/side effects and non-pharmacologic comfort measures Outcome: Progressing   

## 2020-02-25 NOTE — Anesthesia Procedure Notes (Signed)
Anesthesia Regional Block: Axillary brachial plexus block   Pre-Anesthetic Checklist: ,, timeout performed, Correct Patient, Correct Site, Correct Laterality, Correct Procedure, Correct Position, site marked, Risks and benefits discussed, pre-op evaluation,  At surgeon's request and post-op pain management  Laterality: Right  Prep: Maximum Sterile Barrier Precautions used, chloraprep       Needles:  Injection technique: Single-shot  Needle Type: Other     Needle Length: 9cm  Needle Gauge: 21     Additional Needles:   Procedures:, nerve stimulator,,, ultrasound used (permanent image in chart),,,,   Nerve Stimulator or Paresthesia:  Response: Median Nerve,  Response: Radial Nerve,  Response: Musulocutaneous Nerve,   Additional Responses:   Narrative:  Start time: 02/25/2020 2:28 PM End time: 02/25/2020 2:38 PM Injection made incrementally with aspirations every 5 mL.  Additional Notes: 2% Lidocaine skin wheel. The patient still with inadequate motor block. Calculating local anesthetic total dose based on his weight I can use up to 60cc of 0.5% Bupivicaine. I have used 40cc so far. At this point I am switching to 0.25% Bupivicaine so that I can use more volume to try to achieve the block I need. 30cc of 0.25% Bupiv still keeps me under a toxic dose of local anesthetic. I explained this to the patient. He and I both know that this is a painful procedure and we agree to try to do everything I know to numb the arm

## 2020-02-25 NOTE — Progress Notes (Signed)
Patient arrived via hospital bed from PACU. Wife at bedside  Patient in bed resting , bed in lowest position, call bell within reach.

## 2020-02-25 NOTE — Progress Notes (Signed)
Patient  in bed resting quietly. No distress noted, call bell within reach.

## 2020-02-25 NOTE — OR Nursing (Signed)
Pt is awake,alert and oriented.Pt and/or family verbalized understanding of poc and discharge instructions. Reviewed admission and on going care with receiving RN. Pt is in NAD at this time and is ready to be transferred to floor. Will con't to monitor until pt is transferred. Belongings on bed with patient Report given to [facility] RN by [employer]

## 2020-02-25 NOTE — Anesthesia Procedure Notes (Signed)
Anesthesia Regional Block: Supraclavicular block   Pre-Anesthetic Checklist: ,, timeout performed, Correct Patient, Correct Site, Correct Laterality, Correct Procedure, Correct Position, site marked, Risks and benefits discussed, pre-op evaluation,  At surgeon's request and post-op pain management  Laterality: Right  Prep: Maximum Sterile Barrier Precautions used, chloraprep       Needles:  Injection technique: Single-shot  Needle Type: Echogenic Stimulator Needle     Needle Length: 9cm  Needle Gauge: 21     Additional Needles:   Procedures:,,,, ultrasound used (permanent image in chart),,,,  Narrative:  Start time: 02/25/2020 12:41 PM End time: 02/25/2020 12:51 PM Injection made incrementally with aspirations every 5 mL.  Performed by: Personally  Anesthesiologist: Roderic Palau, MD  Additional Notes: 2% Lidocaine skin wheel.

## 2020-02-25 NOTE — Op Note (Signed)
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  Postop diagnosis the same with noted anconeus epitrochlear areas right elbow  Operative procedure #1 right carpal tunnel release open #2 ulnar nerve release at the elbow in situ with resection of an anconeus epitrochlear [place] muscle #3 removal lunate Silastic implant/deep hardware right wrist #4 synovectomy and culture with arthrotomy and debridement right wrist joint and subsequent proximal row carpectomy involving removal of the triquetrum and scaphoid #5 capitate head arthroplasty/replacement arthroplasty partial in nature right wrist #6 posterior interosseous nerve neurectomy right wrist #7 EPL decompression and tendon transfer right wrist #8 8 view radiographic series right wrist with fluoroscopy performed examined and interpreted by myself  Surgeon Roseanne Kaufman  Assistant none  Complications none  Anesthesia block with IV sedation   tourniquet time less than 2 hours  Cultures taken times one of the synovitis. There is no frank evidence of obvious infection.   Description of procedure: The patient is a 69 year old male with significant deterioration of his wrist. He had a Silastic implant in the years past. This is gone on to failure and silicone synovitis. 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 I discussed with him the options for care. It is our hope to give him a motion preserving procedure in 1 which will allow him better functional capabilities.  I discussed with the patient the complexity of the operation and the difficulty in trying to get him back to her usual and normal state of affairs  The operation began with IV sedation after a block  was instituted by Dr. Ola Spurr. He was taken to the operative theater body parts well-padded and following this he was prepped and draped in usual sterile fashion with a 10-minute Hibiclens scrub followed by 10-minute surgical Betadine scrub performed by myself outlined marks were made and following this a sterile field was secured. Tourniquet was insufflated 250 mmHg after timeout was observed and antibiotics were given.  Under a sterile field I made a 1 to 2 cm incision at the distal edge of the transverse carpal ligament dissection was carried down palmar fascia was incised. Following this retractor was placed transverse carpal ligament was released distally fat pad aggressive nicely I confirmed the superficial palmar arch and then dissected in a distal to proximal direction until adequate room was available to release the proximal leaflet and portions of the antebrachial fascia. I was careful to release the ligament off of the ulnar ledge and noted the median nerve is quite hyperemic and under signs of chronic compression.  Following this we irrigated obtained hemostasis with bipolar cautery and closed wound with Prolene.  At this time attention was turned towards the elbow where a posterior medial incision was made dissection was carried down and the patient underwent release of the arcade of Struthers medial intermuscular septum Osborne's ligament 2 heads of the FCU and the cubital tunnel. The patient had a anconeus epitrochlear areas which was resected and was likely because of additional chronic compression.  The patient tolerated this well and there were no complications.  Following this we deflated the tourniquet irrigated and closed wound with Prolene.  At this time we then reinflated the tourniquet and made a utilitarian dorsal approach to the wrist. Dissection was carried down patient  had a large amount of scar tissue. I entered the interval about the 4th dorsal compartment and created a  retinacular flap. Following this the EPL underwent a decompression and anterior transposition to move this out of harm's way. This allowed for access between the 2nd and 4th compartments. I performed a posterior interosseous nerve neurectomy with crushing and cauterization technique.  At this time we then entered the wrist joint. There were prior sutures here which were removed I entered the wrist joint in a T-type fashion. The patient then underwent arthrotomy synovectomy and culture of the synovium. There was no frank signs of infection. This appeared to be silk and synovitis.  I removed the prior lunate and then remove the scaphoid and triquetrum.  This was a proximal row carpectomy and I confirmed this under radiograph. Following this I then very carefully and cautiously looked at the capitate under radiograph and then set out to perform the capitate head arthroplasty procedure.  Guidewire was placed following this we checked and confirmed this under radiograph followed by placement of the reamer and following this placement of the screw. Prior to screw placement I was very careful to obtain bone graft from the scaphoid and triquetrum and packed the area nicely. I wanted to pack the area to ensure that we had good fixation and alleviate any cystic formation which was noted on preop MRI scan. I did this very carefully and cautiously and without issues or complications.  Following this and placement we then reamed the dorsal and the proximal capitate head I used a trial implant for the patient's capitate head arthroplasty and then moved forward with irrigation followed by final placement of the proximal portion.  I chose not to overstuff the joint as I felt the patient would have better congruity. The chronic dislocation of the wrist itself and subluxation has length to a fossa which is more undulating then a deep dish and this was noted at the time of surgery. Nevertheless we had good stability and I  felt that this was the best option for the patient moving forward rather than any type of total wrist arthroplasty and certainly he wanted to avoid the fusion based upon our prior discussions. I was pleased with the look and the stability especially after capsular closure. X-rays were taken and live fluoroscopy demonstrated good stability. The capsule was closed with FiberWire.  The patient tolerated this well there were no complicating features. Once this was complete we then performed irrigation followed by closure of the retinaculum of the 4th dorsal compartment as the EPL was left transferred. I closed the wound with combination 4-0 and 3-0 Prolene and then placed a standard dressing of Adaptic Xeroform 4 x 4's gauze and a volar and dorsal splint to keep the wrist immobilized. The elbow was left free to move. Patient tolerated this well he had soft compartments no complicating features. I discussed all issues with his wife by me over the telephone once the procedure was complete.  We will plan for IV antibiotics general postop observation and other measures. We will ask him to notify us should any problems occur and continue a conservative care pathway moving forward. All questions have been encouraged and answered.   It was a pleasure to spent in his care this is a very complex reconstructive process given the prior silicone arthroplasty attempts.  The patient is aware of this.  We will plan for immobilization for a minimum of 4 weeks and some light mobilization.  Corrado Hymon MD

## 2020-02-26 ENCOUNTER — Encounter (HOSPITAL_COMMUNITY): Payer: Self-pay | Admitting: Orthopedic Surgery

## 2020-02-26 LAB — GLUCOSE, CAPILLARY: Glucose-Capillary: 137 mg/dL — ABNORMAL HIGH (ref 70–99)

## 2020-02-26 MED ORDER — ALPRAZOLAM 0.5 MG PO TABS
1.0000 mg | ORAL_TABLET | Freq: Three times a day (TID) | ORAL | Status: DC | PRN
  Administered 2020-02-26: 1 mg via ORAL
  Filled 2020-02-26: qty 2

## 2020-02-26 MED ORDER — HYDROMORPHONE HCL 1 MG/ML IJ SOLN
1.0000 mg | INTRAMUSCULAR | Status: DC | PRN
  Administered 2020-02-26: 1 mg via INTRAVENOUS
  Filled 2020-02-26: qty 1

## 2020-02-26 MED ORDER — HYDROMORPHONE HCL 1 MG/ML IJ SOLN
1.0000 mg | INTRAMUSCULAR | Status: DC | PRN
  Administered 2020-02-26 – 2020-02-27 (×6): 1 mg via INTRAVENOUS
  Filled 2020-02-26 (×6): qty 1

## 2020-02-26 MED ORDER — METHOCARBAMOL 1000 MG/10ML IJ SOLN
500.0000 mg | Freq: Four times a day (QID) | INTRAVENOUS | Status: DC
  Filled 2020-02-26: qty 5

## 2020-02-26 MED ORDER — HYDROMORPHONE HCL 1 MG/ML IJ SOLN
1.0000 mg | Freq: Once | INTRAMUSCULAR | Status: AC
  Administered 2020-02-26: 1 mg via INTRAVENOUS

## 2020-02-26 MED ORDER — METHOCARBAMOL 500 MG PO TABS
500.0000 mg | ORAL_TABLET | Freq: Four times a day (QID) | ORAL | Status: DC
  Administered 2020-02-26 – 2020-02-27 (×3): 500 mg via ORAL
  Filled 2020-02-26 (×3): qty 1

## 2020-02-26 NOTE — Care Management Obs Status (Signed)
[facility] NOTIFICATION   Patient Details  Name: [name] [name] MRN: [id number] Date of Birth: 09-03-1950   Medicare Observation Status Notification Given:  Yes    Curlene Labrum, RN 02/26/2020, 2:20 PM

## 2020-02-26 NOTE — Plan of Care (Signed)
  Problem: Education: Goal: Knowledge of General Education information will improve Description: Including pain rating scale, medication(s)/side effects and non-pharmacologic comfort measures Outcome: Progressing   Problem: Clinical Measurements: Goal: Ability to maintain clinical measurements within normal limits will improve Outcome: Progressing Goal: Will remain free from infection Outcome: Progressing Goal: Diagnostic test results will improve Outcome: Progressing Goal: Respiratory complications will improve Outcome: Progressing Goal: Cardiovascular complication will be avoided Outcome: Progressing   Problem: Coping: Goal: Level of anxiety will decrease Outcome: Progressing   Problem: Elimination: Goal: Will not experience complications related to bowel motility Outcome: Progressing Goal: Will not experience complications related to urinary retention Outcome: Progressing   Problem: Pain Managment: Goal: General experience of comfort will improve Outcome: Progressing   Problem: Safety: Goal: Ability to remain free from injury will improve Outcome: Progressing

## 2020-02-26 NOTE — Progress Notes (Signed)
Patient ID: [name] [name], male   DOB: 1950-06-27, 69 y.o.   MRN: [id number] Patient is here  Patient is neurovascularly intact.  Excellent flexion extension.  The pain is very significant.  I reviewed this with him at length and the findings.  At present time he is sensate good flexion and extension.  We are monitoring his pain response and will plan for discharge once he is stable and his pain allows.  We discussed the constipating effects of pain medicine.  No signs of DVT.  Abdomen is nontender no labored breathing or other problems.  We will continue pain management IV antibiotics and observation.  Possible home in the next 1 to 2 days based upon his pain tolerance is  Athleen Feltner MD

## 2020-02-26 NOTE — Progress Notes (Signed)
PT Cancellation Note  Patient Details Name: DARYON REMMERT MRN: [id number] DOB: May 24, 1950   Cancelled Treatment:    Reason Eval/Treat Not Completed: PT screened, no needs identified, will sign off Attempted to see patient for PT evaluation. Per OT, patient and wife had questions about mobility and positioning in bed. Upon arrival, introduced myself and explained role of PT. Patient reports he does not need PT and he is able to walk and get up his stairs at home. He states "My arm hurts really bad like 11/10 and I don't want anybody to mess with it again." Discussed with OT following attempt about pt's need for PT. OT reports no apparent balance deficits and ambulates with no AD. PT will sign off. Re-order if necessary.   Perrin Maltese, PT, DPT Acute Rehabilitation Services Pager [phone] Office [phone]    Melene Plan Allred 02/26/2020, 1:50 PM

## 2020-02-26 NOTE — Anesthesia Postprocedure Evaluation (Signed)
Anesthesia Post Note  Patient: ANTHONYJAMES BARGAR  Procedure(s) Performed: RIGHT CARPAL TUNNEL RELEASE, CUBITAL TUNNEL RELEASE IN SITU, RIGHT WRIST PROXIMAL ROW CARPECTOMY AND PROXIMAL CAPITATE HEAD REPLACEMENT/HEMIARTHROPLASTY WITH POSTERIOR INTEROSSOUS NERVE NEURECTOMY AND REPAIR (Right Wrist)     Patient location during evaluation: PACU Anesthesia Type: Regional Level of consciousness: awake and alert Pain management: pain level controlled Vital Signs Assessment: post-procedure vital signs reviewed and stable Respiratory status: spontaneous breathing, nonlabored ventilation and respiratory function stable Cardiovascular status: stable and blood pressure returned to baseline Anesthetic complications: no   No complications documented.  Last Vitals:  Vitals:   02/26/20 0046 02/26/20 0514  BP: (!) 142/74 (!) 147/75  Pulse: 63 68  Resp: 15 14  Temp: 36.8 C 36.9 C  SpO2: 95% 97%    Last Pain:  Vitals:   02/26/20 0730  TempSrc:   PainSc: [place]

## 2020-02-26 NOTE — Progress Notes (Signed)
Patient states that any medication with codeine makes him very sick. Patient refuses oral medication. MD will be notified.

## 2020-02-26 NOTE — Evaluation (Signed)
Occupational Therapy Evaluation Patient Details Name: [name] [name] MRN: [id number] DOB: [birth year] Today's Date: 02/26/2020    History of Present Illness Pt is a 69 y/o male with R wrist arthritis, R carpal tunnel syndrome, and R cubital tunnel syndrome. Pt elected to undergo R cubital tunnel release, R capitate head arthroplasty with PIN neurectomy and reconstruction repair.    Clinical Impression   PTA, pt lives with wife and reports independence with all daily tasks and mobility without use of AD. Pt presents now with impaired use of R dominant hand, decreased coordination and pain. Pt overall Modified Independent for bed mobility, Supervision for mobility in room without AD (cues for line safety). Educated on avoiding WB through arm, elevation and digit/shoulder ROM to prevent stiffness. Pt requires Mod A for UB ADLs and Supervision for LB ADLs. Wife present and demonstrating ability to assist pt with these tasks. Provided education on ADL safety, positioning of UE at home and recommendation of assistance for shower transfers for safety. Recommend follow-up with therapies as deemed appropriate by surgeon after discharge. Plan to further educate on compensatory strategies for UB ADLs during next session to maximize carryover for discharge home.     Follow Up Recommendations  Follow surgeon's recommendation for DC plan and follow-up therapies    Equipment Recommendations  None recommended by OT    Recommendations for Other Services       Precautions / Restrictions Precautions Precautions: Fall;Other (comment) Precaution Comments: Sling in room, no orders in place (unable to bend elbow to 90* for proper sling placement); has foam UE elevator in room Required Braces or Orthoses: Sling Restrictions Weight Bearing Restrictions: Yes Other Position/Activity Restrictions: No orders in place, but assumed NWB R UE      Mobility Bed Mobility Overal bed mobility: Modified Independent                   Transfers Overall transfer level: Needs assistance Equipment used: None Transfers: Sit to/from Stand Sit to Stand: Supervision         General transfer comment: Supervision for safety, no assist needed    Balance Overall balance assessment: No apparent balance deficits (not formally assessed)                                         ADL either performed or assessed with clinical judgement   ADL Overall ADL's : Needs assistance/impaired Eating/Feeding: Set up;Sitting Eating/Feeding Details (indicate cue type and reason): Setup for self feeding  Grooming: Minimal assistance;Sitting   Upper Body Bathing: Moderate assistance;Sitting   Lower Body Bathing: Supervison/ safety;Sitting/lateral leans;Sit to/from stand   Upper Body Dressing : Minimal assistance;Sitting   Lower Body Dressing: Supervision/safety;Sit to/from stand;Sitting/lateral leans;Bed level Lower Body Dressing Details (indicate cue type and reason): Pt demo ability to don pants in bed without assist. Wife assisting in donning underwear first Toilet Transfer: Supervision/safety;Ambulation;Regular Glass blower/designer Details (indicate cue type and reason): no LOB or use of AD needed Toileting- Clothing Manipulation and Hygiene: Sit to/from stand;Supervision/safety Toileting - Clothing Manipulation Details (indicate cue type and reason): Supervision for standing at toilet for urination     Functional mobility during ADLs: Supervision/safety;Cueing for sequencing;Cueing for safety General ADL Comments: Limited by impaired use of R dominant UE and pain     Vision Baseline Vision/History: Wears glasses Wears Glasses: At all times Patient Visual Report: No change from baseline  Vision Assessment?: No apparent visual deficits     Perception     Praxis      Pertinent Vitals/Pain Pain Assessment: Faces Faces Pain Scale: Hurts even more Pain Location: R UE Pain Descriptors  / Indicators: Aching;Discomfort;Grimacing Pain Intervention(s): Limited activity within patient's tolerance;Monitored during session;Repositioned;Ice applied;Patient requesting pain meds-RN notified;Premedicated before session     Hand Dominance Right   Extremity/Trunk Assessment Upper Extremity Assessment Upper Extremity Assessment: RUE deficits/detail RUE Deficits / Details: R UE bandaged from mid hand to proximal elbow. Able to wiggle fingers, mild swelling. shoulder ROM WFL RUE: Unable to fully assess due to immobilization RUE Sensation: WNL RUE Coordination: decreased fine motor;decreased gross motor   Lower Extremity Assessment Lower Extremity Assessment: Overall WFL for tasks assessed   Cervical / Trunk Assessment Cervical / Trunk Assessment: Normal   Communication Communication Communication: No difficulties   Cognition Arousal/Alertness: Awake/alert Behavior During Therapy: WFL for tasks assessed/performed Overall Cognitive Status: Within Functional Limits for tasks assessed                                 General Comments: Cognitively intact, a bit fast and impulsive with movements   General Comments  Pt and wife with questions during session, answered to best of ability. Educated on ADL safety, wife assistance for tasks as needed, ROM of digits and shoulder. Use of foam UE elevator and sleeping positions    Exercises     Shoulder Instructions      Home Living Family/patient expects to be discharged to:: Private residence Living Arrangements: Spouse/significant other Available Help at Discharge: Family;Available 24 hours/day Type of Home: House Home Access: Stairs to enter [employer] of Steps: 4 Entrance Stairs-Rails: Right;Left Home Layout: One level     Bathroom Shower/Tub: Teacher, early years/pre: Standard     Home Equipment: Tub bench;Hand held shower head          Prior Functioning/Environment Level of  Independence: Independent        Comments: No use of AD for mobility. Independent for ADLs, IADLs        OT Problem List: Decreased strength;Decreased range of motion;Decreased knowledge of precautions;Impaired UE functional use;Pain      OT Treatment/Interventions: Self-care/ADL training;Therapeutic exercise;DME and/or AE instruction;Therapeutic activities;Patient/family education    OT Goals(Current goals can be found in the care plan section) Acute Rehab OT Goals Patient Stated Goal: pain control, go home soon OT Goal Formulation: With patient/family Time For Goal Achievement: 03/11/20 Potential to Achieve Goals: Good ADL Goals Pt Will Perform Grooming: with modified independence;standing Pt Will Perform Upper Body Bathing: with set-up;sitting Pt Will Perform Upper Body Dressing: with set-up;sitting  OT Frequency: Min 2X/week   Barriers to D/C:            Co-evaluation              AM-PAC OT "6 Clicks" Daily Activity     Outcome Measure Help from another person eating meals?: A Little Help from another person taking care of personal grooming?: A Little Help from another person toileting, which includes using toliet, bedpan, or urinal?: A Little Help from another person bathing (including washing, rinsing, drying)?: A Lot Help from another person to put on and taking off regular upper body clothing?: A Little Help from another person to put on and taking off regular lower body clothing?: A Little 6 Click Score: 17   End of  Session Nurse Communication: Mobility status;Weight bearing status;Patient requests pain meds  Activity Tolerance: Patient tolerated treatment well Patient left: in bed;with call bell/phone within reach;with bed alarm set;with family/visitor present  OT Visit Diagnosis: Muscle weakness (generalized) (M62.81);Pain Pain - Right/Left: Right Pain - part of body: Arm                Time: 0123-7990 OT Time Calculation (min): 33 min Charges:  OT  General Charges $OT Visit: 1 Visit OT Evaluation $OT Eval Moderate Complexity: 1 Mod OT Treatments $Self Care/Home Management : 8-22 mins  Layla Maw, OTR/L  Layla Maw 02/26/2020, 2:30 PM

## 2020-02-26 NOTE — Care Management CC44 (Signed)
Condition Code 44 Documentation Completed  Patient Details  Name: [name] [name] MRN: [id number] Date of Birth: 1951-01-27   Condition Code 44 given:  Yes Patient signature on Condition Code 44 notice:  Yes Documentation of 2 MD's agreement:  Yes Code 44 added to claim:  Yes    Curlene Labrum, RN 02/26/2020, 2:20 PM

## 2020-02-27 MED ORDER — ALPRAZOLAM 1 MG PO TABS: 1.0000 mg | ORAL_TABLET | Freq: Three times a day (TID) | ORAL | 0 refills | Status: DC | PRN

## 2020-02-27 MED ORDER — KETOROLAC TROMETHAMINE 15 MG/ML IJ SOLN
15.0000 mg | Freq: Once | INTRAMUSCULAR | Status: AC
  Administered 2020-02-27: 15 mg via INTRAVENOUS
  Filled 2020-02-27: qty 1

## 2020-02-27 NOTE — Discharge Summary (Signed)
Physician Discharge Summary  Patient ID: [name] [name] MRN: [id number] DOB/AGE: Nov 09, 1950 69 y.o.  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 s/p RFCA 8/13   Cancer ([place]) 2010   prostate   Chronic combined systolic and diastolic CHF (congestive heart failure) ([employer])    a. LVEF previously 35% felt to be due tachycardia;  b. 02/2013 Echo: EF 50-55%, no rwma, mildly dil LA/RA, mild to mod MR. 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 failed DCCV and multiple anti-arrhythmic drugs (multaq/flecainide);   b. s/p  PVI isolation with ablation of AFib 8/13; c. repeat PVI 01/2013;  d. 02/2013 repeat DCCV->Amio load/xarelto.   Peptic ulcer disease 1994   Renal calculi    Sleep apnea 2015   has not had sleep apnea since heart intervention   Thrombocytopenia ([place]) 1992   idopathic-treated with danazol   Tubular adenoma of colon     Discharge Diagnoses:  Active Problems:   Arthritis of right wrist   Surgeries: Procedure(s): RIGHT CARPAL TUNNEL RELEASE, CUBITAL TUNNEL RELEASE IN SITU, RIGHT WRIST PROXIMAL ROW CARPECTOMY AND PROXIMAL  CAPITATE HEAD REPLACEMENT/HEMIARTHROPLASTY WITH POSTERIOR INTEROSSOUS NERVE NEURECTOMY AND REPAIR on 02/25/2020    Consultants:   Discharged Condition: Improved  Hospital Course: [name] [name] is an 69 y.o. male who was admitted 02/25/2020 with a chief complaint of No chief complaint on file. 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  They were brought to the operating room on 02/25/2020 and underwent Procedure(s): RIGHT CARPAL TUNNEL RELEASE, CUBITAL TUNNEL RELEASE IN SITU, RIGHT WRIST PROXIMAL ROW CARPECTOMY AND PROXIMAL CAPITATE HEAD REPLACEMENT/HEMIARTHROPLASTY WITH POSTERIOR INTEROSSOUS NERVE NEURECTOMY AND REPAIR.    They were given perioperative antibiotics:  Anti-infectives (From admission, onward)   Start     Dose/Rate Route Frequency Ordered Stop   02/26/20 0430  ceFAZolin (ANCEF) IVPB 1 g/50 mL premix        1 g 100 mL/hr over 30 Minutes Intravenous Every 8 hours 02/25/20 1935     02/25/20 2030  ceFAZolin (ANCEF) IVPB 1 g/50 mL premix        1 g 100 mL/hr over 30 Minutes Intravenous NOW 02/25/20 1935 02/25/20 2100   02/25/20 1045  ceFAZolin (ANCEF) IVPB 2g/100 mL premix        2 g 200 mL/hr over 30 Minutes Intravenous On call to O.R. 02/25/20 1042 02/25/20 1512    .  They were given sequential compression devices, early ambulation, and  Recent vital signs:  Patient Vitals for the past 24 hrs:  BP Temp Temp src Pulse Resp SpO2  02/27/20 0908 111/64 98 F (36.7 C) Oral 94 18 98 %  02/27/20 0428 137/86 98.7 F (37.1 C) Oral (!) 106 18 92 %  02/26/20 1913 130/73 98.7 F (37.1 C) Oral (!) 108 17 92 %  02/26/20 1639 (!) 141/95 98.2 F (36.8 C) Oral (!) 106 17 91 %  02/26/20 1006 136/82 98.2 F (36.8 C) Oral 75 17 98 %  .  Recent laboratory studies: DG MINI C-ARM IMAGE ONLY  Result Date: 02/25/2020 There is no interpretation for this exam.  This order is for  images obtained during a surgical procedure.  Please See "Surgeries" Tab for more information regarding the procedure.    Discharge Medications:   Allergies as of 02/27/2020      Reactions   Bystolic [nebivolol Hcl] Swelling   Bradycardia   Calcium Channel Blockers Swelling   LE edema   Phenergan [promethazine Hcl] Itching   Hallucination   Aspirin Other (See Comments)   Hx of ulcer   Nsaids    Hx ulcer   Prednisone Other (See Comments)   Hx of ulcer      Medication List    TAKE these medications   acetaminophen 500 MG tablet Commonly known as: TYLENOL Take 1,000 mg by mouth every 6 (six) hours as needed for moderate pain or headache.   ALPRAZolam 1 MG tablet Commonly known as: XANAX Take 1 tablet (1 mg total) by mouth 3 (three) times daily as needed for anxiety.   B-12 PO Take 1 tablet by mouth daily.   bismuth subsalicylate 947 SJ/62EZ suspension Commonly known as: PEPTO BISMOL Take 30 mLs by mouth every 6 (six) hours as needed for indigestion or diarrhea or loose stools.   BONINE PO Take 1 tablet by mouth daily as needed (nausea).   carvedilol 25 MG tablet Commonly known as: COREG Take 1 tablet (25 mg total) by mouth daily. In the evening What changed:   when to take this  additional instructions   doxycycline 100 MG capsule Commonly known as: VIBRAMYCIN Take 100 mg by mouth 2 (two) times daily.   DULoxetine 30 MG capsule Commonly known as: CYMBALTA Take 30 mg by mouth every morning.   esomeprazole 40 MG capsule Commonly known as: NEXIUM Take 40 mg by mouth daily.   FOLIC ACID PO Take 1 tablet by mouth daily.   GLUCOSAMINE PO Take 1 tablet by mouth 2 (two) times daily.   imipramine 25 MG tablet Commonly known as: TOFRANIL Take 50 mg by mouth at bedtime.   Jardiance 25 MG Tabs tablet Generic drug: empagliflozin Take 25 mg by mouth daily.   levothyroxine 175 MCG tablet Commonly known as: SYNTHROID Take 175 mcg by mouth daily.   losartan  50 MG tablet Commonly known as: COZAAR Take 50 mg by mouth daily.   LYSINE PO Take 1 tablet by mouth daily.   PreserVision AREDS 2 Caps Take 1 capsule by mouth 2 (two) times daily.   MENS 50+ MULTI VITAMIN/MIN PO Take 1 tablet by mouth daily.   metFORMIN 500 MG 24 hr tablet Commonly known as: GLUCOPHAGE-XR Take 1,000 mg by mouth 2 (two) times daily.   ondansetron 4 MG tablet Commonly known as: ZOFRAN Take 4 mg by mouth 2 (two) times daily as needed for nausea.   Ozempic (0.25 or 0.5 MG/DOSE)  2 MG/1.5ML Sopn Generic drug: Semaglutide(0.25 or 0.5MG /DOS) Inject 0.5 mg into the skin every Sunday.   polycarbophil 625 MG tablet Commonly known as: FIBERCON Take 1,250 mg by mouth daily.   potassium chloride SA 20 MEQ tablet Commonly known as: KLOR-CON Take 2 tablets (40 mEq total) by mouth daily. What changed: when to take this   rivaroxaban 20 MG Tabs tablet Commonly known as: Xarelto Take 1 tablet (20 mg total) by mouth daily with supper.   ROLAIDS PO Take 2 tablets by mouth daily as needed (heartburn).   rosuvastatin 10 MG tablet Commonly known as: CRESTOR Take 1 tablet (10 mg total) by mouth daily.   tamsulosin 0.4 MG Caps capsule Commonly known as: FLOMAX Take 0.4 mg by mouth daily as needed (kidney stones).       Diagnostic Studies: DG MINI C-ARM IMAGE ONLY  Result Date: 02/25/2020 There is no interpretation for this exam.  This order is for images obtained during a surgical procedure.  Please See "Surgeries" Tab for more information regarding the procedure.    They benefited maximally from their hospital stay and there were no complications.     Disposition: Discharge disposition: 01-Home or Self Care      Discharge Instructions    Call MD / Call 911   Complete by: As directed    If you experience chest pain or shortness of breath, CALL 911 and be transported to the hospital emergency room.  If you develope a fever above 101 F, pus (white drainage)  or increased drainage or redness at the wound, or calf pain, call your surgeon's office.   Constipation Prevention   Complete by: As directed    Drink plenty of fluids.  Prune juice may be helpful.  You may use a stool softener, such as Colace (over the counter) 100 mg twice a day.  Use MiraLax (over the counter) for constipation as needed.   Diet - low sodium heart healthy   Complete by: As directed    Increase activity slowly as tolerated   Complete by: As directed       Follow-up Information    Roseanne Kaufman, MD Follow up in 14 day(s).   Specialty: Orthopedic Surgery Why: Dr. Amedeo Plenty will see in 14 days his office should call for this follow-up appointment Contact information: [address] [place] [address] [phone]              Status post surgical reconstruction right wrist with carpal and cubital tunnel release. We will see the patient back in 14 days notify me same problems care.  We discussed all issues plans and concerns with the patient.  If any problems arise patient will notify me.  Is been a pleasure to see and treat him. Discharge medicines were addressed and he has his medicines previously prescribed through my office.  He will begin his Xarelto tomorrow.  I will see him for any needs and he does have my cellular phone number for any emergencies.  Overall today he looks very well he has no complications no signs of UTI DVT or other complicating feature while in the hospital.  Signed: Willa Frater III 02/27/2020, 9:52 AM

## 2020-02-27 NOTE — Plan of Care (Addendum)
Pain not controlled by PO or IV. Offered to reach out to doctor on call patient asked me not to and wait until morning. Patient almost in tears due to pain. Will continue with pain regimen and pass off to day shift nurse patient needs pain regimen reassessed by doctor. Will continue to monitor patient.   @0440  during hourly rounds found patient crying, stated the pain will not stop, patient stated not to bother the doctor, due to patient in tears I contacted Dr. Phoebe Sharps for another option for pain relief. Will continue to monitor patient.   1 dose Toradol order, pt tolerated it well stated its the most relief he has had all night pt is not at a 6 pain. Pt dilaudid just was not doing the trick.  Problem: Education: Goal: Knowledge of General Education information will improve Description: Including pain rating scale, medication(s)/side effects and non-pharmacologic comfort measures Outcome: Progressing   Problem: Activity: Goal: Risk for activity intolerance will decrease Outcome: Progressing   Problem: Pain Managment: Goal: General experience of comfort will improve Outcome: Progressing   Problem: Safety: Goal: Ability to remain free from injury will improve Outcome: Progressing

## 2020-02-27 NOTE — Progress Notes (Signed)
[name] [name] to be discharged home per MD order.  Discussed prescriptions and follow up appointments with the patient. Prescriptions sent to patient's preferred pharmacy, medication list explained in detail. Pt verbalized understanding.  Allergies as of 02/27/2020      Reactions   Bystolic [nebivolol Hcl] Swelling   Bradycardia   Calcium Channel Blockers Swelling   LE edema   Phenergan [promethazine Hcl] Itching   Hallucination   Aspirin Other (See Comments)   Hx of ulcer   Nsaids    Hx ulcer   Prednisone Other (See Comments)   Hx of ulcer      Medication List    TAKE these medications   acetaminophen 500 MG tablet Commonly known as: TYLENOL Take 1,000 mg by mouth every 6 (six) hours as needed for moderate pain or headache.   ALPRAZolam 1 MG tablet Commonly known as: XANAX Take 1 tablet (1 mg total) by mouth 3 (three) times daily as needed for anxiety.   B-12 PO Take 1 tablet by mouth daily.   bismuth subsalicylate 322 GU/54YH suspension Commonly known as: PEPTO BISMOL Take 30 mLs by mouth every 6 (six) hours as needed for indigestion or diarrhea or loose stools.   BONINE PO Take 1 tablet by mouth daily as needed (nausea).   carvedilol 25 MG tablet Commonly known as: COREG Take 1 tablet (25 mg total) by mouth daily. In the evening What changed:   when to take this  additional instructions   doxycycline 100 MG capsule Commonly known as: VIBRAMYCIN Take 100 mg by mouth 2 (two) times daily.   DULoxetine 30 MG capsule Commonly known as: CYMBALTA Take 30 mg by mouth every morning.   esomeprazole 40 MG capsule Commonly known as: NEXIUM Take 40 mg by mouth daily.   FOLIC ACID PO Take 1 tablet by mouth daily.   GLUCOSAMINE PO Take 1 tablet by mouth 2 (two) times daily.   imipramine 25 MG tablet Commonly known as: TOFRANIL Take 50 mg by mouth at bedtime.   Jardiance 25 MG Tabs tablet Generic drug: empagliflozin Take 25 mg by mouth daily.    levothyroxine 175 MCG tablet Commonly known as: SYNTHROID Take 175 mcg by mouth daily.   losartan 50 MG tablet Commonly known as: COZAAR Take 50 mg by mouth daily.   LYSINE PO Take 1 tablet by mouth daily.   PreserVision AREDS 2 Caps Take 1 capsule by mouth 2 (two) times daily.   MENS 50+ MULTI VITAMIN/MIN PO Take 1 tablet by mouth daily.   metFORMIN 500 MG 24 hr tablet Commonly known as: GLUCOPHAGE-XR Take 1,000 mg by mouth 2 (two) times daily.   ondansetron 4 MG tablet Commonly known as: ZOFRAN Take 4 mg by mouth 2 (two) times daily as needed for nausea.   Ozempic (0.25 or 0.5 MG/DOSE) 2 MG/1.5ML Sopn Generic drug: Semaglutide(0.25 or 0.5MG /DOS) Inject 0.5 mg into the skin every Sunday.   polycarbophil 625 MG tablet Commonly known as: FIBERCON Take 1,250 mg by mouth daily.   potassium chloride SA 20 MEQ tablet Commonly known as: KLOR-CON Take 2 tablets (40 mEq total) by mouth daily. What changed: when to take this   rivaroxaban 20 MG Tabs tablet Commonly known as: Xarelto Take 1 tablet (20 mg total) by mouth daily with supper.   ROLAIDS PO Take 2 tablets by mouth daily as needed (heartburn).   rosuvastatin 10 MG tablet Commonly known as: CRESTOR Take 1 tablet (10 mg total) by mouth daily.  tamsulosin 0.4 MG Caps capsule Commonly known as: FLOMAX Take 0.4 mg by mouth daily as needed (kidney stones).       Vitals:   02/27/20 0428 02/27/20 0908  BP: 137/86 111/64  Pulse: (!) 106 94  Resp: 18 18  Temp: 98.7 F (37.1 C) 98 F (36.7 C)  SpO2: 92% 98%     IV catheter discontinued. Dressing and pressure applied. Patient's pain currently under control, received oxycodone this morning. No other complaints noted.  An After Visit Summary was printed and given to the patient. Patient escorted via Wheelchair, and Discharged home via private auto with wife.  [place] 02/27/2020 12:48 PM

## 2020-02-27 NOTE — Progress Notes (Signed)
OT Cancellation Note  Patient Details Name: [name] [name] MRN: [id number] DOB: Sep 22, 1950   Cancelled Treatment:    Reason Eval/Treat Not Completed: Patient declined, no reason specified. Pt with planned discharge home today. Pt declined OT session today - citing no questions/concerns about ADLs or mobility. Pt awaiting wife to arrive with clothing for discharge and denied participating in session today.   Layla Maw 02/27/2020, 12:18 PM

## 2020-02-27 NOTE — Plan of Care (Signed)

## 2020-02-27 NOTE — Discharge Instructions (Signed)
Please call Dr. Amedeo Plenty for any problems his cellular phone is [phone] keep bandage clean and dry.  Call for any problems.  No smoking.  Criteria for driving a car: you should be off your pain medicine for 7-8 hours, able to drive one handed(confident), thinking clearly and feeling able in your judgement to drive. Continue elevation as it will decrease swelling.  If instructed by MD move your fingers within the confines of the bandage/splint.  Use ice if instructed by your MD. Call immediately for any sudden loss of feeling in your hand/arm or change in functional abilities of the extremity.We recommend that you to take vitamin C 1000 mg a day to promote healing. We also recommend that if you require  pain medicine that you take a stool softener to prevent constipation as most pain medicines will have constipation side effects. We recommend either Peri-Colace or Senokot and recommend that you also consider adding MiraLAX as well to prevent the constipation affects from pain medicine if you are required to use them. These medicines are over the counter and may be purchased at a local pharmacy. A cup of yogurt and a probiotic can also be helpful during the recovery process as the medicines can disrupt your intestinal environment.

## 2020-03-01 LAB — AEROBIC/ANAEROBIC CULTURE W GRAM STAIN (SURGICAL/DEEP WOUND): Culture: NO GROWTH

## 2020-03-09 DIAGNOSIS — Z4789 Encounter for other orthopedic aftercare: Secondary | ICD-10-CM | POA: Diagnosis not present

## 2020-03-09 DIAGNOSIS — G5601 Carpal tunnel syndrome, right upper limb: Secondary | ICD-10-CM | POA: Diagnosis not present

## 2020-03-09 DIAGNOSIS — M25531 Pain in right wrist: Secondary | ICD-10-CM | POA: Diagnosis not present

## 2020-03-12 DIAGNOSIS — Z4789 Encounter for other orthopedic aftercare: Secondary | ICD-10-CM | POA: Diagnosis not present

## 2020-03-12 DIAGNOSIS — G5601 Carpal tunnel syndrome, right upper limb: Secondary | ICD-10-CM | POA: Diagnosis not present

## 2020-03-12 DIAGNOSIS — M25531 Pain in right wrist: Secondary | ICD-10-CM | POA: Diagnosis not present

## 2020-03-19 ENCOUNTER — Telehealth: Payer: Self-pay | Admitting: Interventional Cardiology

## 2020-03-19 ENCOUNTER — Other Ambulatory Visit: Payer: Self-pay

## 2020-03-19 ENCOUNTER — Ambulatory Visit: Payer: Medicare Other

## 2020-03-19 ENCOUNTER — Encounter: Payer: Self-pay | Admitting: Interventional Cardiology

## 2020-03-19 ENCOUNTER — Ambulatory Visit (INDEPENDENT_AMBULATORY_CARE_PROVIDER_SITE_OTHER): Payer: Medicare Other | Admitting: Interventional Cardiology

## 2020-03-19 VITALS — BP 108/72 | HR 140 | Ht 68.0 in | Wt 211.0 lb

## 2020-03-19 DIAGNOSIS — I4892 Unspecified atrial flutter: Secondary | ICD-10-CM | POA: Diagnosis not present

## 2020-03-19 DIAGNOSIS — E119 Type 2 diabetes mellitus without complications: Secondary | ICD-10-CM

## 2020-03-19 DIAGNOSIS — R000 Tachycardia, unspecified: Secondary | ICD-10-CM | POA: Diagnosis not present

## 2020-03-19 DIAGNOSIS — Z7901 Long term (current) use of anticoagulants: Secondary | ICD-10-CM | POA: Diagnosis not present

## 2020-03-19 MED ORDER — MULTAQ 400 MG PO TABS: 400.0000 mg | ORAL_TABLET | Freq: Two times a day (BID) | ORAL | 11 refills | Status: DC

## 2020-03-19 MED ORDER — DILTIAZEM HCL 30 MG PO TABS: 30.0000 mg | ORAL_TABLET | Freq: Three times a day (TID) | ORAL | 1 refills | Status: DC | PRN

## 2020-03-19 NOTE — Progress Notes (Signed)
Cardiology Office Note   Date:  03/19/2020   ID:  [name] [name], [name] [birth year], MRN [id number]  PCP:  [name] Battles, MD    No chief complaint on file.  Palpitations  Wt Readings from Last 3 Encounters:  03/19/20 211 lb (95.7 kg)  02/25/20 220 lb (99.8 kg)  01/22/20 213 lb 3.2 oz (96.7 kg)       History of Present Illness: [name] [name] is a 69 y.o. male  with a h/o pesisitent afib s/p 2 ablations andin11/16underwent a convergent afib ablation with Dr. Lehman Prom and Dr. Danise Mina at [facility]. He had some complications with cardiogenic shock per pt and had to be intubated and spend 4 days in intensive care.  It was recommended that he stop his Multaq.He wanted to stop Xarelto.After discussion with Dr. Arvil Persons 2019, itwas recommended that he continue Xarelto.  He was very symptomatic in the past when he had AFib.  Wife has RA, takes immunosuppression.   He had wrist surgery in November 2021.  He was off of his anticoagulation for several days.  He restarted the anticoagulation over 2 weeks ago.  Yesterday, he went out for a walk which was his first major activity since his wrist surgery.  Last night, on December 8 at 11 PM, he could feel palpitations.  His heart rate was in the 130 range based on his check.  This persisted even after taking his morning medicines this morning.  We brought him to the office for an ECG which revealed 2-1 atrial flutter.  He notes that in the past, cardioversions worked temporarily but he has gone out of rhythm sometimes in a short time as a day.  He has not tolerated amiodarone in the past.  He did do well with Multaq in the past.  He no longer has any Multaq at home.  He has used diltiazem in the past as well as needed.  He does not have any of this at home.   He has some mild dyspnea on exertion.  He can feel the palpitations which are mildly uncomfortable.  No chest pain.  No syncope.    Past Medical History:  Diagnosis Date   . Arthritis    OA  . Atrial flutter ([place])    a. s/p RFCA 8/13  . Cancer [facility]) 2010   prostate  . Chronic combined systolic and diastolic CHF (congestive heart failure) ([place])    a. LVEF previously 35% felt to be due tachycardia;  b. 02/2013 Echo: EF 50-55%, no rwma, mildly dil LA/RA, mild to mod MR. C 11/2014 echo - ef 60-65%, unable to determine DD  . CKD (chronic kidney disease), stage III (HCC)    hx of a/c renal failure during episode of diverticulitis 9/13 in [place], [place]  . Diabetes mellitus ([place])    TYPE 2   . Diverticulitis   . Diverticulosis   . Dyslipidemia   . Erectile dysfunction   . Gastric ulcer    s/p prior surgery  . GERD (gastroesophageal reflux disease)   . Headache   . History of colonic diverticulitis   . History of prostate cancer   . Hypertension   . Hypothyroid   . Microcytic anemia   . Obesities, morbid ([place])   . Obstructive sleep apnea    noncompliant with CPAP  . Osteoporosis   . PAF (paroxysmal atrial fibrillation) ([place])    a. failed DCCV and multiple anti-arrhythmic drugs (multaq/flecainide);   b. s/p  PVI isolation with  ablation of AFib 8/13; c. repeat PVI 01/2013;  d. 02/2013 repeat DCCV->Amio load/xarelto.  . Peptic ulcer disease 1994  . Renal calculi   . Sleep apnea 2015   has not had sleep apnea since heart intervention  . Thrombocytopenia ([place]) 1992   idopathic-treated with danazol  . Tubular adenoma of colon     Past Surgical History:  Procedure Laterality Date  . ATRIAL FIBRILLATION ABLATION  12/06/11; 02/05/2013   Afib and atrial flutter ablation by Dr Rayann Heman; repeat PVI by Dr Rayann Heman 02/05/2013  . ATRIAL FIBRILLATION ABLATION N/A 12/06/2011   Procedure: ATRIAL FIBRILLATION ABLATION;  Surgeon: Thompson Grayer, MD;  [facility] C[facility] LAB;  Service: Cardiovascular;  Laterality: N/A;  . ATRIAL FIBRILLATION ABLATION N/A 02/05/2013   Procedure: ATRIAL FIBRILLATION ABLATION;  Surgeon: Coralyn Mark, MD;  Location: [place] CATH LAB;  Service:  Cardiovascular;  Laterality: N/A;  . benign stomach tumor removal  2000  . CARDIOVERSION  08/25/2011   Procedure: CARDIOVERSION;  Surgeon: Jettie Booze, MD;  Location: [place];  Service: Cardiovascular;  Laterality: N/A;  . CARDIOVERSION N/A 03/05/2013   Procedure: CARDIOVERSION;  Surgeon: Sinclair Grooms, MD;  Location: [place];  Service: Cardiovascular;  Laterality: N/A;  BEDSIDE   . CARPAL TUNNEL WITH CUBITAL TUNNEL Right 02/25/2020   Procedure: RIGHT CARPAL TUNNEL RELEASE, CUBITAL TUNNEL RELEASE IN SITU, RIGHT WRIST PROXIMAL ROW CARPECTOMY AND PROXIMAL CAPITATE HEAD REPLACEMENT/HEMIARTHROPLASTY WITH POSTERIOR INTEROSSOUS NERVE NEURECTOMY AND REPAIR;  Surgeon: Roseanne Kaufman, MD;  Location: [place];  Service: Orthopedics;  Laterality: Right;  2.5 hrs Block with IV Sedation  . convergent afib abaltion [facility] 02/26/15  02/26/15   [facility] by Dr. Lehman Prom and Dr. Danise Mina  . ELECTROPHYSIOLOGIC STUDY N/A 11/24/2014   Procedure: Cardioversion;  Surgeon: Will Meredith Leeds, MD;  Location: [place] CV LAB;  Service: Cardiovascular;  Laterality: N/A;  . ESOPHAGOGASTRODUODENOSCOPY N/A 04/09/2013   Procedure: ESOPHAGOGASTRODUODENOSCOPY (EGD) with possible Balloon Dilation;  Surgeon: Garlan Fair, MD;  Location: [facility] ENDOSCOPY;  Service: Endoscopy;  Laterality: N/A;  . kidney stone removal     multiple  . KNEE ARTHROSCOPY Bilateral 1979  . LAPAROSCOPIC RETROPUBIC PROSTATECTOMY  02/2007   hx prostate cancer  . ORIF ANKLE FRACTURE Right 10/2015  . ORIF ANKLE FRACTURE Right 11/03/2015   Procedure: ORIF RIGHT LISFRANK FOOT;  Surgeon: Newt Minion, MD;  Location: [place];  Service: Orthopedics;  Laterality: Right;  . TEE WITHOUT CARDIOVERSION  08/25/2011   Procedure: TRANSESOPHAGEAL ECHOCARDIOGRAM (TEE);  Surgeon: Jettie Booze, MD;  Location: [place];  Service: Cardiovascular;  Laterality: N/A;  . TEE WITHOUT CARDIOVERSION  11/09/2011   Procedure: TRANSESOPHAGEAL ECHOCARDIOGRAM (TEE);   Surgeon: Jettie Booze, MD;  Location: [facility] ENDOSCOPY;  Service: Cardiovascular;  Laterality: N/A;  h/p in file drawer  . TEE WITHOUT CARDIOVERSION N/A 02/05/2013   Procedure: TRANSESOPHAGEAL ECHOCARDIOGRAM (TEE);  Surgeon: Candee Furbish, MD;  Location: [facility] ENDOSCOPY;  Service: Cardiovascular;  Laterality: N/A;  . TOTAL THYROIDECTOMY  2009   benign nodules removed  . WRIST SURGERY Right 1977   with placement of lunate prosthesis     Current Outpatient Medications  Medication Sig Dispense Refill  . acetaminophen (TYLENOL) 500 MG tablet Take 1,000 mg by mouth every 6 (six) hours as needed for moderate pain or headache.    . ALPRAZolam (XANAX) 1 MG tablet Take 1 tablet (1 mg total) by mouth 3 (three) times daily as needed for anxiety. 30 tablet 0  . bismuth subsalicylate (PEPTO BISMOL) 262  MG/15ML suspension Take 30 mLs by mouth every 6 (six) hours as needed for indigestion or diarrhea or loose stools.    . Ca Carbonate-Mag Hydroxide (ROLAIDS PO) Take 2 tablets by mouth daily as needed (heartburn).    . carvedilol (COREG) 25 MG tablet Take 25 mg by mouth 2 (two) times daily with a meal.    . Cyanocobalamin (B-12 PO) Take 1 tablet by mouth daily.    Marland Kitchen doxycycline (VIBRAMYCIN) 100 MG capsule Take 100 mg by mouth 2 (two) times daily.    . DULoxetine (CYMBALTA) 30 MG capsule Take 30 mg by mouth every morning.     Marland Kitchen esomeprazole (NEXIUM) 40 MG capsule Take 40 mg by mouth daily.     Marland Kitchen FOLIC ACID PO Take 1 tablet by mouth daily.    Marland Kitchen GLUCOSAMINE PO Take 1 tablet by mouth 2 (two) times daily.     Marland Kitchen imipramine (TOFRANIL) 25 MG tablet Take 50 mg by mouth at bedtime.    Marland Kitchen JARDIANCE 25 MG TABS tablet Take 25 mg by mouth daily.    Marland Kitchen levothyroxine (SYNTHROID) 175 MCG tablet Take 175 mcg by mouth daily.    Marland Kitchen losartan (COZAAR) 50 MG tablet Take 50 mg by mouth daily.    Marland Kitchen LYSINE PO Take 1 tablet by mouth daily.    . Meclizine HCl (BONINE PO) Take 1 tablet by mouth daily as needed (nausea).    . metFORMIN  (GLUCOPHAGE-XR) 500 MG 24 hr tablet Take 1,000 mg by mouth 2 (two) times daily.    . Multiple Vitamins-Minerals (MENS 50+ MULTI VITAMIN/MIN PO) Take 1 tablet by mouth daily.    . Multiple Vitamins-Minerals (PRESERVISION AREDS 2) CAPS Take 1 capsule by mouth 2 (two) times daily.    . ondansetron (ZOFRAN) 4 MG tablet Take 4 mg by mouth 2 (two) times daily as needed for nausea.     Marland Kitchen OZEMPIC, 0.25 OR 0.5 MG/DOSE, 2 MG/1.5ML SOPN Inject 0.5 mg into the skin every Sunday.     . polycarbophil (FIBERCON) 625 MG tablet Take 1,250 mg by mouth daily.    . potassium chloride SA (KLOR-CON) 20 MEQ tablet Take 40 mEq by mouth 2 (two) times daily.    . rivaroxaban (XARELTO) 20 MG TABS tablet Take 1 tablet (20 mg total) by mouth daily with supper. 90 tablet 1  . rosuvastatin (CRESTOR) 10 MG tablet Take 1 tablet (10 mg total) by mouth daily. 90 tablet 3  . tamsulosin (FLOMAX) 0.4 MG CAPS capsule Take 0.4 mg by mouth daily as needed (kidney stones).     Marland Kitchen diltiazem (CARDIZEM) 30 MG tablet Take 1 tablet (30 mg total) by mouth 3 (three) times daily as needed (Heart Rate greater than 140). 30 tablet 1  . dronedarone (MULTAQ) 400 MG tablet Take 1 tablet (400 mg total) by mouth 2 (two) times daily with a meal. 60 tablet 11   Current Facility-Administered Medications  Medication Dose Route Frequency Provider Last Rate Last Admin  . 0.9 %  sodium chloride infusion  500 mL Intravenous Once Pyrtle, Lajuan Lines, MD        Allergies:   Thayer Jew hcl], Calcium channel blockers, Phenergan [promethazine hcl], Aspirin, Nsaids, and Prednisone    Social History:  The patient  reports that he has never smoked. He has never used smokeless tobacco. He reports that he does not drink alcohol and does not use drugs.   Family History:  The patient's family history includes Atrial fibrillation in his brother;  Diabetes in his brother; Emphysema in his mother; Lung cancer in his father and sister; Throat cancer in his sister.     ROS:  Please see the history of present illness.   Otherwise, review of systems are positive for palpitations.   All other systems are reviewed and negative.    PHYSICAL EXAM: VS:  BP 108/72   Pulse (!) 140   Ht 5\' 8"  (1.727 m)   Wt 211 lb (95.7 kg)   SpO2 98%   BMI 32.08 kg/m  , BMI Body mass index is 32.08 kg/m. GEN: Well nourished, well developed, in no acute distress  HEENT: normal  Neck: no JVD, carotid bruits, or masses Cardiac: Tachycardic, regular; no murmurs, rubs, or gallops,no edema  Respiratory:  clear to auscultation bilaterally, normal work of breathing GI: soft, nontender, nondistended, + BS MS: no deformity or atrophy  Skin: warm and dry, no rash Neuro:  Strength and sensation are intact Psych: euthymic mood, full affect   EKG:   The ekg ordered today demonstrates atrial flutter, 2: 1 conduction   Recent Labs: 02/25/2020: BUN 18; Creatinine, Ser 1.04; Hemoglobin 13.0; Platelets 208; Potassium 3.8; Sodium 138   Lipid Panel    Component Value Date/Time   CHOL 181 07/24/2013 0530   TRIG 99 07/24/2013 0530   HDL 52 07/24/2013 0530   CHOLHDL 3.5 07/24/2013 0530   VLDL 20 07/24/2013 0530   LDLCALC 109 (H) 07/24/2013 0530     Other studies Reviewed: Additional studies/ records that were reviewed today with results demonstrating: Prior records reviewed.   ASSESSMENT AND PLAN:  1. Atrial flutter: He has been on his blood thinner for over 2 weeks.  His symptoms of atrial flutter started clearly last night at 11:00PM.  I think a cardioversion would be the quickest way to get him out of atrial flutter.  Unfortunately, there are no slots in the hospital tomorrow and he would require Covid testing regardless.  We discussed going to the emergency room but he would like to avoid this.  Will restart Multaq 400 mg twice daily.  We will also give a prescription for diltiazem 30 mg p.o. 3 times daily as needed for heart rate over 140.  If his symptoms get worse, he  should go to the emergency room and they should be able to perform a cardioversion and send him home the same day if things remain as they currently are.  Hopefully, with some Multaq on board, a cardioversion will be more likely the last.  The other possibility is that the Multaq will convert him back to normal sinus rhythm.  Since he has not missed any anticoagulation in over 2 weeks and knows that his atrial flutter is less than 24 hours in duration, I think it is reasonable to put him on an antiarrhythmic.  Since he has been anticoagulated for the whole time that he has been atrial flutter, I think it would be reasonable to cardiovert him in the ER if he develops worsening symptoms. 2. Hypertension: Blood pressure control.  Rate slowing medicine limited by lower blood pressure. 3. Obesity: He has lost some weight with his wrist surgery. 4. Diabetes: A1c 5.8 in May 2021.   Current medicines are reviewed at length with the patient today.  The patient concerns regarding his medicines were addressed.  The following changes have been made:  No change  Labs/ tests ordered today include:  Orders Placed This Encounter  Procedures  . EKG 12-Lead    Recommend  150 minutes/week of aerobic exercise Low fat, low carb, high fiber diet recommended  Disposition:   FU with AFib clinic next week.   Signed, Larae Grooms, MD  03/19/2020 1:19 PM    [facility] [address], [place], [place]  [address] Phone: [phone]; Fax: [phone]

## 2020-03-19 NOTE — Telephone Encounter (Signed)
Called and spoke with patient. His BP is 110/86 and HR 136 bpm now after morning meds. We will bring patient in for an EKG to see if he is back in Afib. Patient remains asymptomatic other than feeling tired.

## 2020-03-19 NOTE — Telephone Encounter (Signed)
Patient c/o Palpitations:  High priority if patient c/o lightheadedness, shortness of breath, or chest pain  1) How long have you had palpitations/irregular HR/ Afib? Are you having the symptoms now? Started in the middle of the night / heart racing and it still feels that way now   2) Are you currently experiencing lightheadedness, SOB or CP? Little bite of SOB   3) Do you have a history of afib (atrial fibrillation) or irregular heart rhythm? Yes in the past   4) Have you checked your BP or HR? (document readings if available):  BP -125-95 HR -139  5) Are you experiencing any other symptoms? NO   Best number [phone]

## 2020-03-19 NOTE — Patient Instructions (Signed)
Medication Instructions:  Your physician has recommended you make the following change in your medication:   1. START: multaq 400 mg tablet: Take 1 tablet by mouth twice a day  2. START: diltiazem 30 mg tablet: Take 1 tablet by mouth three times a day AS NEEDED for hear rate greater than 140 bpm   *If you need a refill on your cardiac medications before your next appointment, please call your pharmacy*   Lab Work: None  If you have labs (blood work) drawn today and your tests are completely normal, you will receive your results only by: Marland Kitchen MyChart Message (if you have MyChart) OR . A paper copy in the mail If you have any lab test that is abnormal or we need to change your treatment, we will call you to review the results.   Testing/Procedures: None  Follow-Up: Follow up in the Afib Clinic  AFIB CLINIC INFORMATION: Your appointment is scheduled on: 03/23/20 at 3:00 PM The AFib Clinic is located in the [facility] Clinics at [facility]. Parking instructions/directions: [address] (off [address]). When you pull in to Entrance C, there is an underground parking garage to your right. The code to enter the garage is 3007. Take the elevators to the first floor. Follow the signs to the Heart and Vascular Specialty Clinics. You will see registration at the end of the hallway.  Phone number: [phone]  Other Instructions If your symptoms worsen you need to go to the Emergency Room

## 2020-03-19 NOTE — Telephone Encounter (Signed)
The patient woke up last night at 2330 and his heart was racing. Throughout the night, he checked his HR and it ranged from 160-220 bpm.  This morning, his BP is 130s/80s and HR 130s.  He denies any other symptoms. He specifically denies SOB and CP and states it does not feel like he is in afib or that his heart rate is irregular. While he is taking his medications as directed, he had not yet taken his morning meds. He took them (including carvedilol 25 mg) while on the phone.   He understands he will be called in about an hour or two to evaluated HR after carvedilol is taken for an update. He was grateful for call and agrees with plan.

## 2020-03-23 ENCOUNTER — Other Ambulatory Visit: Payer: Self-pay

## 2020-03-23 ENCOUNTER — Ambulatory Visit (HOSPITAL_COMMUNITY)
Admission: RE | Admit: 2020-03-23 | Discharge: 2020-03-23 | Disposition: A | Discharge status: Home / Self Care | Payer: Medicare Other | Source: Ambulatory Visit | Attending: Nurse Practitioner | Admitting: Nurse Practitioner

## 2020-03-23 ENCOUNTER — Encounter (HOSPITAL_COMMUNITY): Payer: Self-pay | Admitting: Nurse Practitioner

## 2020-03-23 VITALS — BP 132/86 | HR 107 | Ht 68.0 in | Wt 207.8 lb

## 2020-03-23 DIAGNOSIS — Z8249 Family history of ischemic heart disease and other diseases of the circulatory system: Secondary | ICD-10-CM | POA: Insufficient documentation

## 2020-03-23 DIAGNOSIS — D6869 Other thrombophilia: Secondary | ICD-10-CM

## 2020-03-23 DIAGNOSIS — Z79899 Other long term (current) drug therapy: Secondary | ICD-10-CM | POA: Insufficient documentation

## 2020-03-23 DIAGNOSIS — Z888 Allergy status to other drugs, medicaments and biological substances status: Secondary | ICD-10-CM | POA: Insufficient documentation

## 2020-03-23 DIAGNOSIS — E785 Hyperlipidemia, unspecified: Secondary | ICD-10-CM | POA: Diagnosis not present

## 2020-03-23 DIAGNOSIS — I4892 Unspecified atrial flutter: Secondary | ICD-10-CM | POA: Diagnosis not present

## 2020-03-23 DIAGNOSIS — Z886 Allergy status to analgesic agent status: Secondary | ICD-10-CM | POA: Diagnosis not present

## 2020-03-23 DIAGNOSIS — Z885 Allergy status to narcotic agent status: Secondary | ICD-10-CM | POA: Insufficient documentation

## 2020-03-23 DIAGNOSIS — E119 Type 2 diabetes mellitus without complications: Secondary | ICD-10-CM | POA: Diagnosis not present

## 2020-03-23 DIAGNOSIS — Z7901 Long term (current) use of anticoagulants: Secondary | ICD-10-CM | POA: Diagnosis not present

## 2020-03-23 LAB — COMPREHENSIVE METABOLIC PANEL
ALT: 19 U/L (ref 0–44)
AST: 15 U/L (ref 15–41)
Albumin: 3.1 g/dL — ABNORMAL LOW (ref 3.5–5.0)
Alkaline Phosphatase: 73 U/L (ref 38–126)
Anion gap: 10 (ref 5–15)
BUN: 17 mg/dL (ref 8–23)
CO2: 22 mmol/L (ref 22–32)
Calcium: 9 mg/dL (ref 8.9–10.3)
Chloride: 106 mmol/L (ref 98–111)
Creatinine, Ser: 1.36 mg/dL — ABNORMAL HIGH (ref 0.61–1.24)
GFR, Estimated: 56 mL/min — ABNORMAL LOW (ref >60)
Glucose, Bld: 121 mg/dL — ABNORMAL HIGH (ref 70–99)
Potassium: 4.1 mmol/L (ref 3.5–5.1)
Sodium: 138 mmol/L (ref 135–145)
Total Bilirubin: 0.7 mg/dL (ref 0.3–1.2)
Total Protein: 6.4 g/dL — ABNORMAL LOW (ref 6.5–8.1)

## 2020-03-23 LAB — TSH: TSH: 2.26 u[IU]/mL (ref 0.350–4.500)

## 2020-03-23 LAB — CBC
HCT: 36.7 % — ABNORMAL LOW (ref 39.0–52.0)
Hemoglobin: 12.3 g/dL — ABNORMAL LOW (ref 13.0–17.0)
MCH: 29.6 pg (ref 26.0–34.0)
MCHC: 33.5 g/dL (ref 30.0–36.0)
MCV: 88.2 fL (ref 80.0–100.0)
Platelets: 287 10*3/uL (ref 150–400)
RBC: 4.16 MIL/uL — ABNORMAL LOW (ref 4.22–5.81)
RDW: 15.4 % (ref 11.5–15.5)
WBC: 6.5 10*3/uL (ref 4.0–10.5)
nRBC: 0 % (ref 0.0–0.2)

## 2020-03-23 NOTE — Patient Instructions (Signed)
Cardioversion scheduled for Wednesday, December 15th  - Arrive at the [facility] and go to admitting at [facility] not eat or drink anything after midnight the night prior to your procedure.  - Take all your morning medication (except diabetic medications) with a sip of water prior to arrival.  - You will not be able to drive home after your procedure.  - Do NOT miss any doses of your blood thinner - if you should miss a dose please notify our office immediately.  - If you feel as if you go back into normal rhythm prior to scheduled cardioversion, please notify our office immediately. If your procedure is canceled in the cardioversion suite you will be charged a cancellation fee.

## 2020-03-23 NOTE — Progress Notes (Addendum)
 Primary Care Physician: Paterson, Daniel, MD Referring Physician: Dr. Varanasi    [name] [name] is a 69 y.o. male with a h/o 2 ablations and in 11/16 underwent a convergent afib ablation with Dr. Mounsey and Dr. Caranasos at [facility]. He had some complications with cardiogenic shock per pt and had to be intubated and spend 4 days in intensive care. He has had 5 years of being afib free.   He went out of rhythm  last week and saw Dr. Vararnasi, 12/9. He was started on Multaq 400 mg bid and referred here as he could not find a spot for an expedited cardioversion. Unfortunately, he vomited his pm dose of xarelto last Thursday but would still prefer to have a TEE guided cardioversion instead of waiting for 3 weeks uninterrupted anticoagulation as he is very symptomatic in afib. He was given 30 mg to use as needed for RVR, used one dose and it made his legs swell. He had rt wrist surgery 11/18 and is still wearing a cast rt arm.   Today, he denies symptoms of palpitations, chest pain, shortness of breath, orthopnea, PND, lower extremity edema, dizziness, presyncope, syncope, or neurologic sequela. The patient is tolerating medications without difficulties and is otherwise without complaint today.   Past Medical History:  Diagnosis Date  . Arthritis    OA  . Atrial flutter (HCC)    a. s/p RFCA 8/13  . Cancer (HCC) 2010   prostate  . Chronic combined systolic and diastolic CHF (congestive heart failure) (HCC)    a. LVEF previously 35% felt to be due tachycardia;  b. 02/2013 Echo: EF 50-55%, no rwma, mildly dil LA/RA, mild to mod MR. C 11/2014 echo - ef 60-65%, unable to determine DD  . CKD (chronic kidney disease), stage III (HCC)    hx of a/c renal failure during episode of diverticulitis 9/13 in [place], [place]  . Diabetes mellitus (HCC)    TYPE 2   . Diverticulitis   . Diverticulosis   . Dyslipidemia   . Erectile dysfunction   . Gastric ulcer    s/p prior surgery  . GERD (gastroesophageal  reflux disease)   . Headache   . History of colonic diverticulitis   . History of prostate cancer   . Hypertension   . Hypothyroid   . Microcytic anemia   . Obesities, morbid (HCC)   . Obstructive sleep apnea    noncompliant with CPAP  . Osteoporosis   . PAF (paroxysmal atrial fibrillation) (HCC)    a. failed DCCV and multiple anti-arrhythmic drugs (multaq/flecainide);   b. s/p  PVI isolation with ablation of AFib 8/13; c. repeat PVI 01/2013;  d. 02/2013 repeat DCCV->Amio load/xarelto.  . Peptic ulcer disease 1994  . Renal calculi   . Sleep apnea 2015   has not had sleep apnea since heart intervention  . Thrombocytopenia (HCC) 1992   idopathic-treated with danazol  . Tubular adenoma of colon    Past Surgical History:  Procedure Laterality Date  . ATRIAL FIBRILLATION ABLATION  12/06/11; 02/05/2013   Afib and atrial flutter ablation by Dr Allred; repeat PVI by Dr Allred 02/05/2013  . ATRIAL FIBRILLATION ABLATION N/A 12/06/2011   Procedure: ATRIAL FIBRILLATION ABLATION;  Surgeon: James Allred, MD;  Location: [facility] CATH [facility];  Service: Cardiovascular;  Laterality: N/A;  . ATRIAL FIBRILLATION ABLATION N/A 02/05/2013   Procedure: ATRIAL FIBRILLATION ABLATION;  Surgeon: James D Allred, MD;  Location: [facility] CATH [facility];  Service: Cardiovascular;  Laterality: N/A;  .   benign stomach tumor removal  2000  . CARDIOVERSION  08/25/2011   Procedure: CARDIOVERSION;  Surgeon: Jayadeep S. Varanasi, MD;  Location: [facility] ENDOS[facility];  Service: Cardiovascular;  Laterality: N/A;  . CARDIOVERSION N/A 03/05/2013   Procedure: CARDIOVERSION;  Surgeon: Henry W Smith III, MD;  Location: [facility] OR;  Service: Cardiovascular;  Laterality: N/A;  BEDSIDE   . CARPAL TUNNEL WITH CUBITAL TUNNEL Right 02/25/2020   Procedure: RIGHT CARPAL TUNNEL RELEASE, CUBITAL TUNNEL RELEASE IN SITU, RIGHT WRIST PROXIMAL ROW CARPECTOMY AND PROXIMAL CAPITATE HEAD REPLACEMENT/HEMIARTHROPLASTY WITH POSTERIOR INTEROSSOUS NERVE NEURECTOMY AND REPAIR;  Surgeon:  Gramig, Rayshawn, MD;  Location: [facility] OR;  Service: Orthopedics;  Laterality: Right;  2.5 hrs Block with IV Sedation  . convergent afib abaltion [facility] 02/26/15  02/26/15   [facility] by Dr. Mounsey and Dr. Caranasos  . ELECTROPHYSIOLOGIC STUDY N/A 11/24/2014   Procedure: Cardioversion;  Surgeon: Will Martin Camnitz, MD;  Location: [facility] INVASIVE [facility];  Service: Cardiovascular;  Laterality: N/A;  . ESOPHAGOGASTRODUODENOSCOPY N/A 04/09/2013   Procedure: ESOPHAGOGASTRODUODENOSCOPY (EGD) with possible Balloon Dilation;  Surgeon: Martin K Johnson, MD;  Location: [facility];  Service: Endoscopy;  Laterality: N/A;  . kidney stone removal     multiple  . KNEE ARTHROSCOPY Bilateral 1979  . LAPAROSCOPIC RETROPUBIC PROSTATECTOMY  02/2007   hx prostate cancer  . ORIF ANKLE FRACTURE Right 10/2015  . ORIF ANKLE FRACTURE Right 11/03/2015   Procedure: ORIF RIGHT LISFRANK FOOT;  Surgeon: Marcus V Duda, MD;  Location: [facility] OR;  Service: Orthopedics;  Laterality: Right;  . TEE WITHOUT CARDIOVERSION  08/25/2011   Procedure: TRANSESOPHAGEAL ECHOCARDIOGRAM (TEE);  Surgeon: Jayadeep S. Varanasi, MD;  Location: [facility];  Service: Cardiovascular;  Laterality: N/A;  . TEE WITHOUT CARDIOVERSION  11/09/2011   Procedure: TRANSESOPHAGEAL ECHOCARDIOGRAM (TEE);  Surgeon: Jayadeep S. Varanasi, MD;  Location: [facility];  Service: Cardiovascular;  Laterality: N/A;  h/p in file drawer  . TEE WITHOUT CARDIOVERSION N/A 02/05/2013   Procedure: TRANSESOPHAGEAL ECHOCARDIOGRAM (TEE);  Surgeon: Mark Skains, MD;  Location: [facility];  Service: Cardiovascular;  Laterality: N/A;  . TOTAL THYROIDECTOMY  2009   benign nodules removed  . WRIST SURGERY Right 1977   with placement of lunate prosthesis    Current Outpatient Medications  Medication Sig Dispense Refill  . acetaminophen (TYLENOL) 500 MG tablet Take 1,000 mg by mouth every 6 (six) hours as needed for moderate pain or headache.    . ALPRAZolam (XANAX) 1 MG tablet Take 1 tablet (1  mg total) by mouth 3 (three) times daily as needed for anxiety. 30 tablet 0  . bismuth subsalicylate (PEPTO BISMOL) 262 MG/15ML suspension Take 30 mLs by mouth every 6 (six) hours as needed for indigestion or diarrhea or loose stools.    . Ca Carbonate-Mag Hydroxide (ROLAIDS PO) Take 2 tablets by mouth daily as needed (heartburn).    . carvedilol (COREG) 25 MG tablet Take 25 mg by mouth 2 (two) times daily with a meal.    . Cyanocobalamin (B-12 PO) Take 1 tablet by mouth daily.    . dronedarone (MULTAQ) 400 MG tablet Take 1 tablet (400 mg total) by mouth 2 (two) times daily with a meal. 60 tablet 11  . DULoxetine (CYMBALTA) 30 MG capsule Take 30 mg by mouth every morning.     . esomeprazole (NEXIUM) 40 MG capsule Take 40 mg by mouth daily.     . FOLIC ACID PO Take 1 tablet by mouth daily.    . GLUCOSAMINE PO Take 1 tablet by   mouth 2 (two) times daily.     . HYDROmorphone (DILAUDID) 2 MG tablet Take 2 mg by mouth as needed.    . imipramine (TOFRANIL) 25 MG tablet Take 50 mg by mouth at bedtime.    . JARDIANCE 25 MG TABS tablet Take 25 mg by mouth daily.    . levothyroxine (SYNTHROID) 175 MCG tablet Take 175 mcg by mouth daily.    . losartan (COZAAR) 50 MG tablet Take 50 mg by mouth daily.    . LYSINE PO Take 1 tablet by mouth daily.    . Meclizine HCl (BONINE PO) Take 1 tablet by mouth daily as needed (nausea).    . metFORMIN (GLUCOPHAGE-XR) 500 MG 24 hr tablet Take 1,000 mg by mouth 2 (two) times daily.    . methocarbamol (ROBAXIN) 500 MG tablet Take 500 mg by mouth as needed.    . Multiple Vitamins-Minerals (MENS 50+ MULTI VITAMIN/MIN PO) Take 1 tablet by mouth daily.    . Multiple Vitamins-Minerals (PRESERVISION AREDS 2) CAPS Take 1 capsule by mouth 2 (two) times daily.    . NARCAN 4 MG/0.1ML LIQD nasal spray kit 1 spray once.    . ondansetron (ZOFRAN) 4 MG tablet Take 4 mg by mouth 2 (two) times daily as needed for nausea.     . ONETOUCH VERIO test strip 1 each daily.    . OZEMPIC, 0.25 OR  0.5 MG/DOSE, 2 MG/1.5ML SOPN Inject 0.5 mg into the skin every Sunday.     . polycarbophil (FIBERCON) 625 MG tablet Take 1,250 mg by mouth daily.    . potassium chloride SA (KLOR-CON) 20 MEQ tablet Take 40 mEq by mouth 2 (two) times daily.    . rivaroxaban (XARELTO) 20 MG TABS tablet Take 1 tablet (20 mg total) by mouth daily with supper. 90 tablet 1  . rosuvastatin (CRESTOR) 10 MG tablet Take 1 tablet (10 mg total) by mouth daily. 90 tablet 3  . tamsulosin (FLOMAX) 0.4 MG CAPS capsule Take 0.4 mg by mouth daily as needed (kidney stones).      Current Facility-Administered Medications  Medication Dose Route Frequency Provider Last Rate Last Admin  . 0.9 %  sodium chloride infusion  500 mL Intravenous Once Pyrtle, Jay M, MD        Allergies  Allergen Reactions  . Bystolic [Nebivolol Hcl] Swelling    Bradycardia   . Calcium Channel Blockers Swelling    LE edema  . Phenergan [Promethazine Hcl] Itching    Hallucination   . Cardizem [Diltiazem] Other (See Comments)    Foot swelling and increased his heart rate  . Aspirin Other (See Comments)    Hx of ulcer   . Nsaids     Hx ulcer  . Prednisone Other (See Comments)    Hx of ulcer    Social History   Socioeconomic History  . Marital status: Married    Spouse name: Not on file  . Number of children: Not on file  . Years of education: Not on file  . Highest education level: Not on file  Occupational History  . Not on file  Tobacco Use  . Smoking status: Never Smoker  . Smokeless tobacco: Never Used  Vaping Use  . Vaping Use: Never used  Substance and Sexual Activity  . Alcohol use: No  . Drug use: No  . Sexual activity: Yes  Other Topics Concern  . Not on file  Social History Narrative   Lives in [place].  Works as a   supervisor at [employer] Strain: Not on file  Food Insecurity: Not on file  Transportation Needs: Not on file  Physical Activity: Not  on file  Stress: Not on file  Social Connections: Not on file  Intimate Partner Violence: Not on file    Family History  Problem Relation Age of Onset  . Emphysema Mother   . Lung cancer Father   . Diabetes Brother   . Atrial fibrillation Brother   . Throat cancer Sister   . Lung cancer Sister   . Colon cancer Neg Hx     ROS- All systems are reviewed and negative except as per the HPI above  Physical Exam: Vitals:   03/23/20 1503  BP: 132/86  Pulse: (!) 107  Weight: 94.3 kg  Height: 5' 8" (1.727 m)   Wt Readings from Last 3 Encounters:  03/23/20 94.3 kg  03/19/20 95.7 kg  02/25/20 99.8 kg    Labs: Lab Results  Component Value Date   NA 138 02/25/2020   K 3.8 02/25/2020   CL 106 02/25/2020   CO2 20 (L) 02/25/2020   GLUCOSE 98 02/25/2020   BUN 18 02/25/2020   CREATININE 1.04 02/25/2020   CALCIUM 9.1 02/25/2020   MG 1.9 03/05/2013   Lab Results  Component Value Date   INR 1.88 (H) 10/31/2015   Lab Results  Component Value Date   CHOL 181 07/24/2013   HDL 52 07/24/2013   LDLCALC 109 (H) 07/24/2013   TRIG 99 07/24/2013     GEN- The patient is well appearing, alert and oriented x 3 today.   Head- normocephalic, atraumatic Eyes-  Sclera clear, conjunctiva pink Ears- hearing intact Oropharynx- clear Neck- supple, no JVP Lymph- no cervical lymphadenopathy Lungs- Clear to ausculation bilaterally, normal work of breathing Heart- irregular rate and rhythm, no murmurs, rubs or gallops, PMI not laterally displaced GI- soft, NT, ND, + BS Extremities- no clubbing, cyanosis, or edema, cast present on rt lower arm  MS- no significant deformity or atrophy Skin- no rash or lesion Psych- euthymic mood, full affect Neuro- strength and sensation are intact  EKG-atrial flutter with variable block at 107 bpm    Assessment and Plan: 1. Atrial  flutter  Complex afib history with 2 ablations and an convergent procedure in 2016 Started last week, ?  Trigger Continue Multaq 400 mg bid that Dr. Irish Lack started last week  Continue carvedilol 25 mg bid  Will schedule a TEE cardioversion 04/01/20, he vomited a pm dose of xarelto 2-3 hours after taking Thursday pm Cbc/bmet/covid test scheduled   2. CHA2DS2VASc score of 6 Continue  xarelto 20 mg daily   F/u in afib clinic one week after cardioversion   Butch Penny C. [name], [facility] [address] [place], [place] [address] [phone]

## 2020-03-23 NOTE — H&P (View-Only) (Signed)
Primary Care Physician: Leanna Battles, MD Referring Physician: Dr. George Ina JAHAD OLD is a 69 y.o. male with a h/o 2 ablations and in 11/16 underwent a convergent afib ablation with Dr. Lehman Prom and Dr. Danise Mina at [facility]. He had some complications with cardiogenic shock per pt and had to be intubated and spend 4 days in intensive care. He has had 5 years of being afib free.   He went out of rhythm  last week and saw Dr. Barth Kirks, 12/9. He was started on Multaq 400 mg bid and referred here as he could not find a spot for an expedited cardioversion. Unfortunately, he vomited his pm dose of xarelto last Thursday but would still prefer to have a TEE guided cardioversion instead of waiting for 3 weeks uninterrupted anticoagulation as he is very symptomatic in afib. He was given 30 mg to use as needed for RVR, used one dose and it made his legs swell. He had rt wrist surgery 11/18 and is still wearing a cast rt arm.   Today, he denies symptoms of palpitations, chest pain, shortness of breath, orthopnea, PND, lower extremity edema, dizziness, presyncope, syncope, or neurologic sequela. The patient is tolerating medications without difficulties and is otherwise without complaint today.   Past Medical History:  Diagnosis Date  . Arthritis    OA  . Atrial flutter ([place])    a. s/p RFCA 8/13  . Cancer [facility]) 2010   prostate  . Chronic combined systolic and diastolic CHF (congestive heart failure) ([place])    a. LVEF previously 35% felt to be due tachycardia;  b. 02/2013 Echo: EF 50-55%, no rwma, mildly dil LA/RA, mild to mod MR. C 11/2014 echo - ef 60-65%, unable to determine DD  . CKD (chronic kidney disease), stage III (HCC)    hx of a/c renal failure during episode of diverticulitis 9/13 in [place], [place]  . Diabetes mellitus ([place])    TYPE 2   . Diverticulitis   . Diverticulosis   . Dyslipidemia   . Erectile dysfunction   . Gastric ulcer    s/p prior surgery  . GERD (gastroesophageal  reflux disease)   . Headache   . History of colonic diverticulitis   . History of prostate cancer   . Hypertension   . Hypothyroid   . Microcytic anemia   . Obesities, morbid ([place])   . Obstructive sleep apnea    noncompliant with CPAP  . Osteoporosis   . PAF (paroxysmal atrial fibrillation) ([place])    a. failed DCCV and multiple anti-arrhythmic drugs (multaq/flecainide);   b. s/p  PVI isolation with ablation of AFib 8/13; c. repeat PVI 01/2013;  d. 02/2013 repeat DCCV->Amio load/xarelto.  . Peptic ulcer disease 1994  . Renal calculi   . Sleep apnea 2015   has not had sleep apnea since heart intervention  . Thrombocytopenia ([place]) 1992   idopathic-treated with danazol  . Tubular adenoma of colon    Past Surgical History:  Procedure Laterality Date  . ATRIAL FIBRILLATION ABLATION  12/06/11; 02/05/2013   Afib and atrial flutter ablation by Dr Rayann Heman; repeat PVI by Dr Rayann Heman 02/05/2013  . ATRIAL FIBRILLATION ABLATION N/A 12/06/2011   Procedure: ATRIAL FIBRILLATION ABLATION;  Surgeon: Thompson Grayer, MD;  Location: [facility] CATH LAB;  Service: Cardiovascular;  Laterality: N/A;  . ATRIAL FIBRILLATION ABLATION N/A 02/05/2013   Procedure: ATRIAL FIBRILLATION ABLATION;  Surgeon: Coralyn Mark, MD;  Location: [place] CATH LAB;  Service: Cardiovascular;  Laterality: N/A;  .  benign stomach tumor removal  2000  . CARDIOVERSION  08/25/2011   Procedure: CARDIOVERSION;  Surgeon: Jettie Booze, MD;  Location: [place];  Service: Cardiovascular;  Laterality: N/A;  . CARDIOVERSION N/A 03/05/2013   Procedure: CARDIOVERSION;  Surgeon: Sinclair Grooms, MD;  Location: [place];  Service: Cardiovascular;  Laterality: N/A;  BEDSIDE   . CARPAL TUNNEL WITH CUBITAL TUNNEL Right 02/25/2020   Procedure: RIGHT CARPAL TUNNEL RELEASE, CUBITAL TUNNEL RELEASE IN SITU, RIGHT WRIST PROXIMAL ROW CARPECTOMY AND PROXIMAL CAPITATE HEAD REPLACEMENT/HEMIARTHROPLASTY WITH POSTERIOR INTEROSSOUS NERVE NEURECTOMY AND REPAIR;  Surgeon:  Roseanne Kaufman, MD;  Location: [place];  Service: Orthopedics;  Laterality: Right;  2.5 hrs Block with IV Sedation  . convergent afib abaltion [facility] 02/26/15  02/26/15   [facility] by Dr. Lehman Prom and Dr. Danise Mina  . ELECTROPHYSIOLOGIC STUDY N/A 11/24/2014   Procedure: Cardioversion;  Surgeon: Will Meredith Leeds, MD;  Location: [place] CV LAB;  Service: Cardiovascular;  Laterality: N/A;  . ESOPHAGOGASTRODUODENOSCOPY N/A 04/09/2013   Procedure: ESOPHAGOGASTRODUODENOSCOPY (EGD) with possible Balloon Dilation;  Surgeon: Garlan Fair, MD;  Location: [facility] ENDOSCOPY;  Service: Endoscopy;  Laterality: N/A;  . kidney stone removal     multiple  . KNEE ARTHROSCOPY Bilateral 1979  . LAPAROSCOPIC RETROPUBIC PROSTATECTOMY  02/2007   hx prostate cancer  . ORIF ANKLE FRACTURE Right 10/2015  . ORIF ANKLE FRACTURE Right 11/03/2015   Procedure: ORIF RIGHT LISFRANK FOOT;  Surgeon: Newt Minion, MD;  Location: [place];  Service: Orthopedics;  Laterality: Right;  . TEE WITHOUT CARDIOVERSION  08/25/2011   Procedure: TRANSESOPHAGEAL ECHOCARDIOGRAM (TEE);  Surgeon: Jettie Booze, MD;  Location: [place];  Service: Cardiovascular;  Laterality: N/A;  . TEE WITHOUT CARDIOVERSION  11/09/2011   Procedure: TRANSESOPHAGEAL ECHOCARDIOGRAM (TEE);  Surgeon: Jettie Booze, MD;  Location: [facility] ENDOSCOPY;  Service: Cardiovascular;  Laterality: N/A;  h/p in file drawer  . TEE WITHOUT CARDIOVERSION N/A 02/05/2013   Procedure: TRANSESOPHAGEAL ECHOCARDIOGRAM (TEE);  Surgeon: Candee Furbish, MD;  Location: [facility] ENDOSCOPY;  Service: Cardiovascular;  Laterality: N/A;  . TOTAL THYROIDECTOMY  2009   benign nodules removed  . WRIST SURGERY Right 1977   with placement of lunate prosthesis    Current Outpatient Medications  Medication Sig Dispense Refill  . acetaminophen (TYLENOL) 500 MG tablet Take 1,000 mg by mouth every 6 (six) hours as needed for moderate pain or headache.    . ALPRAZolam (XANAX) 1 MG tablet Take 1 tablet (1  mg total) by mouth 3 (three) times daily as needed for anxiety. 30 tablet 0  . bismuth subsalicylate (PEPTO BISMOL) 262 MG/15ML suspension Take 30 mLs by mouth every 6 (six) hours as needed for indigestion or diarrhea or loose stools.    . Ca Carbonate-Mag Hydroxide (ROLAIDS PO) Take 2 tablets by mouth daily as needed (heartburn).    . carvedilol (COREG) 25 MG tablet Take 25 mg by mouth 2 (two) times daily with a meal.    . Cyanocobalamin (B-12 PO) Take 1 tablet by mouth daily.    Marland Kitchen dronedarone (MULTAQ) 400 MG tablet Take 1 tablet (400 mg total) by mouth 2 (two) times daily with a meal. 60 tablet 11  . DULoxetine (CYMBALTA) 30 MG capsule Take 30 mg by mouth every morning.     Marland Kitchen esomeprazole (NEXIUM) 40 MG capsule Take 40 mg by mouth daily.     Marland Kitchen FOLIC ACID PO Take 1 tablet by mouth daily.    Marland Kitchen GLUCOSAMINE PO Take 1 tablet by  mouth 2 (two) times daily.     Marland Kitchen HYDROmorphone (DILAUDID) 2 MG tablet Take 2 mg by mouth as needed.    Marland Kitchen imipramine (TOFRANIL) 25 MG tablet Take 50 mg by mouth at bedtime.    Marland Kitchen JARDIANCE 25 MG TABS tablet Take 25 mg by mouth daily.    Marland Kitchen levothyroxine (SYNTHROID) 175 MCG tablet Take 175 mcg by mouth daily.    Marland Kitchen losartan (COZAAR) 50 MG tablet Take 50 mg by mouth daily.    Marland Kitchen LYSINE PO Take 1 tablet by mouth daily.    . Meclizine HCl (BONINE PO) Take 1 tablet by mouth daily as needed (nausea).    . metFORMIN (GLUCOPHAGE-XR) 500 MG 24 hr tablet Take 1,000 mg by mouth 2 (two) times daily.    . methocarbamol (ROBAXIN) 500 MG tablet Take 500 mg by mouth as needed.    . Multiple Vitamins-Minerals (MENS 50+ MULTI VITAMIN/MIN PO) Take 1 tablet by mouth daily.    . Multiple Vitamins-Minerals (PRESERVISION AREDS 2) CAPS Take 1 capsule by mouth 2 (two) times daily.    Marland Kitchen NARCAN 4 MG/0.1ML LIQD nasal spray kit 1 spray once.    . ondansetron (ZOFRAN) 4 MG tablet Take 4 mg by mouth 2 (two) times daily as needed for nausea.     Glory Rosebush VERIO test strip 1 each daily.    Marland Kitchen OZEMPIC, 0.25 OR  0.5 MG/DOSE, 2 MG/1.5ML SOPN Inject 0.5 mg into the skin every Sunday.     . polycarbophil (FIBERCON) 625 MG tablet Take 1,250 mg by mouth daily.    . potassium chloride SA (KLOR-CON) 20 MEQ tablet Take 40 mEq by mouth 2 (two) times daily.    . rivaroxaban (XARELTO) 20 MG TABS tablet Take 1 tablet (20 mg total) by mouth daily with supper. 90 tablet 1  . rosuvastatin (CRESTOR) 10 MG tablet Take 1 tablet (10 mg total) by mouth daily. 90 tablet 3  . tamsulosin (FLOMAX) 0.4 MG CAPS capsule Take 0.4 mg by mouth daily as needed (kidney stones).      Current Facility-Administered Medications  Medication Dose Route Frequency Provider Last Rate Last Admin  . 0.9 %  sodium chloride infusion  500 mL Intravenous Once Pyrtle, Lajuan Lines, MD        Allergies  Allergen Reactions  . Bystolic [Nebivolol Hcl] Swelling    Bradycardia   . Calcium Channel Blockers Swelling    LE edema  . Phenergan [Promethazine Hcl] Itching    Hallucination   . Cardizem [Diltiazem] Other (See Comments)    Foot swelling and increased his heart rate  . Aspirin Other (See Comments)    Hx of ulcer   . Nsaids     Hx ulcer  . Prednisone Other (See Comments)    Hx of ulcer    Social History   Socioeconomic History  . Marital status: Married    Spouse name: Not on file  . Number of children: Not on file  . Years of education: Not on file  . Highest education level: Not on file  Occupational History  . Not on file  Tobacco Use  . Smoking status: Never Smoker  . Smokeless tobacco: Never Used  Vaping Use  . Vaping Use: Never used  Substance and Sexual Activity  . Alcohol use: No  . Drug use: No  . Sexual activity: Yes  Other Topics Concern  . Not on file  Social History Narrative   Lives in [place].  Works as a  supervisor at [employer] Strain: Not on file  Food Insecurity: Not on file  Transportation Needs: Not on file  Physical Activity: Not  on file  Stress: Not on file  Social Connections: Not on file  Intimate Partner Violence: Not on file    Family History  Problem Relation Age of Onset  . Emphysema Mother   . Lung cancer Father   . Diabetes Brother   . Atrial fibrillation Brother   . Throat cancer Sister   . Lung cancer Sister   . Colon cancer Neg Hx     ROS- All systems are reviewed and negative except as per the HPI above  Physical Exam: Vitals:   03/23/20 1503  BP: 132/86  Pulse: (!) 107  Weight: 94.3 kg  Height: 5' 8" (1.727 m)   Wt Readings from Last 3 Encounters:  03/23/20 94.3 kg  03/19/20 95.7 kg  02/25/20 99.8 kg    Labs: Lab Results  Component Value Date   NA 138 02/25/2020   K 3.8 02/25/2020   CL 106 02/25/2020   CO2 20 (L) 02/25/2020   GLUCOSE 98 02/25/2020   BUN 18 02/25/2020   CREATININE 1.04 02/25/2020   CALCIUM 9.1 02/25/2020   MG 1.9 03/05/2013   Lab Results  Component Value Date   INR 1.88 (H) 10/31/2015   Lab Results  Component Value Date   CHOL 181 07/24/2013   HDL 52 07/24/2013   LDLCALC 109 (H) 07/24/2013   TRIG 99 07/24/2013     GEN- The patient is well appearing, alert and oriented x 3 today.   Head- normocephalic, atraumatic Eyes-  Sclera clear, conjunctiva pink Ears- hearing intact Oropharynx- clear Neck- supple, no JVP Lymph- no cervical lymphadenopathy Lungs- Clear to ausculation bilaterally, normal work of breathing Heart- irregular rate and rhythm, no murmurs, rubs or gallops, PMI not laterally displaced GI- soft, NT, ND, + BS Extremities- no clubbing, cyanosis, or edema, cast present on rt lower arm  MS- no significant deformity or atrophy Skin- no rash or lesion Psych- euthymic mood, full affect Neuro- strength and sensation are intact  EKG-atrial flutter with variable block at 107 bpm    Assessment and Plan: 1. Atrial  flutter  Complex afib history with 2 ablations and an convergent procedure in 2016 Started last week, ?  Trigger Continue Multaq 400 mg bid that Dr. Irish Lack started last week  Continue carvedilol 25 mg bid  Will schedule a TEE cardioversion 04/01/20, he vomited a pm dose of xarelto 2-3 hours after taking Thursday pm Cbc/bmet/covid test scheduled   2. CHA2DS2VASc score of 6 Continue  xarelto 20 mg daily   F/u in afib clinic one week after cardioversion   Butch Penny C. Kela Baccari, [facility] [address] [place], [place] [address] [phone]

## 2020-03-24 ENCOUNTER — Other Ambulatory Visit (HOSPITAL_COMMUNITY)
Admission: RE | Admit: 2020-03-24 | Discharge: 2020-03-24 | Disposition: A | Discharge status: Home / Self Care | Payer: Medicare Other | Source: Ambulatory Visit | Attending: Cardiovascular Disease | Admitting: Cardiovascular Disease

## 2020-03-24 DIAGNOSIS — Z01812 Encounter for preprocedural laboratory examination: Secondary | ICD-10-CM | POA: Insufficient documentation

## 2020-03-24 DIAGNOSIS — Z20822 Contact with and (suspected) exposure to covid-19: Secondary | ICD-10-CM | POA: Insufficient documentation

## 2020-03-24 LAB — SARS CORONAVIRUS 2 (TAT 6-24 HRS): SARS Coronavirus 2: NEGATIVE

## 2020-03-25 ENCOUNTER — Ambulatory Visit (HOSPITAL_COMMUNITY): Payer: Medicare Other | Admitting: Anesthesiology

## 2020-03-25 ENCOUNTER — Other Ambulatory Visit: Payer: Self-pay

## 2020-03-25 ENCOUNTER — Ambulatory Visit (HOSPITAL_BASED_OUTPATIENT_CLINIC_OR_DEPARTMENT_OTHER)
Admission: RE | Admit: 2020-03-25 | Discharge: 2020-03-25 | Disposition: A | Discharge status: Home / Self Care | Payer: Medicare Other | Source: Ambulatory Visit | Attending: Nurse Practitioner | Admitting: Nurse Practitioner

## 2020-03-25 ENCOUNTER — Encounter (HOSPITAL_COMMUNITY)
Admission: RE | Disposition: A | Discharge status: Home / Self Care | Payer: Self-pay | Source: Home / Self Care | Attending: Cardiovascular Disease

## 2020-03-25 ENCOUNTER — Ambulatory Visit (HOSPITAL_COMMUNITY)
Admission: RE | Admit: 2020-03-25 | Discharge: 2020-03-25 | Disposition: A | Discharge status: Home / Self Care | Payer: Medicare Other | Attending: Cardiovascular Disease | Admitting: Cardiovascular Disease

## 2020-03-25 ENCOUNTER — Encounter (HOSPITAL_COMMUNITY): Payer: Self-pay | Admitting: Cardiovascular Disease

## 2020-03-25 DIAGNOSIS — I340 Nonrheumatic mitral (valve) insufficiency: Secondary | ICD-10-CM | POA: Diagnosis not present

## 2020-03-25 DIAGNOSIS — Z79890 Hormone replacement therapy (postmenopausal): Secondary | ICD-10-CM | POA: Insufficient documentation

## 2020-03-25 DIAGNOSIS — Z886 Allergy status to analgesic agent status: Secondary | ICD-10-CM | POA: Insufficient documentation

## 2020-03-25 DIAGNOSIS — Z79899 Other long term (current) drug therapy: Secondary | ICD-10-CM | POA: Diagnosis not present

## 2020-03-25 DIAGNOSIS — I130 Hypertensive heart and chronic kidney disease with heart failure and stage 1 through stage 4 chronic kidney disease, or unspecified chronic kidney disease: Secondary | ICD-10-CM | POA: Diagnosis not present

## 2020-03-25 DIAGNOSIS — I4891 Unspecified atrial fibrillation: Secondary | ICD-10-CM | POA: Diagnosis not present

## 2020-03-25 DIAGNOSIS — I480 Paroxysmal atrial fibrillation: Secondary | ICD-10-CM | POA: Diagnosis not present

## 2020-03-25 DIAGNOSIS — I4819 Other persistent atrial fibrillation: Secondary | ICD-10-CM

## 2020-03-25 DIAGNOSIS — Z7901 Long term (current) use of anticoagulants: Secondary | ICD-10-CM | POA: Insufficient documentation

## 2020-03-25 DIAGNOSIS — Z888 Allergy status to other drugs, medicaments and biological substances status: Secondary | ICD-10-CM | POA: Diagnosis not present

## 2020-03-25 DIAGNOSIS — N183 Chronic kidney disease, stage 3 unspecified: Secondary | ICD-10-CM | POA: Diagnosis not present

## 2020-03-25 DIAGNOSIS — I4892 Unspecified atrial flutter: Secondary | ICD-10-CM | POA: Insufficient documentation

## 2020-03-25 DIAGNOSIS — I5042 Chronic combined systolic (congestive) and diastolic (congestive) heart failure: Secondary | ICD-10-CM | POA: Diagnosis not present

## 2020-03-25 HISTORY — PX: TEE WITHOUT CARDIOVERSION: SHX5443

## 2020-03-25 HISTORY — PX: CARDIOVERSION: SHX1299

## 2020-03-25 LAB — GLUCOSE, CAPILLARY: Glucose-Capillary: 107 mg/dL — ABNORMAL HIGH (ref 70–99)

## 2020-03-25 SURGERY — ECHOCARDIOGRAM, TRANSESOPHAGEAL
Anesthesia: General

## 2020-03-25 MED ORDER — PROPOFOL 10 MG/ML IV BOLUS
INTRAVENOUS | Status: DC | PRN
  Administered 2020-03-25: 20 mg via INTRAVENOUS
  Administered 2020-03-25: 30 mg via INTRAVENOUS
  Administered 2020-03-25 (×2): 20 mg via INTRAVENOUS

## 2020-03-25 MED ORDER — PROPOFOL 500 MG/50ML IV EMUL
INTRAVENOUS | Status: DC | PRN
  Administered 2020-03-25: 75 ug/kg/min via INTRAVENOUS

## 2020-03-25 MED ORDER — LIDOCAINE 2% (20 MG/ML) 5 ML SYRINGE
INTRAMUSCULAR | Status: DC | PRN
  Administered 2020-03-25: 60 mg via INTRAVENOUS

## 2020-03-25 MED ORDER — SODIUM CHLORIDE 0.9 % IV SOLN: INTRAVENOUS | Status: DC

## 2020-03-25 NOTE — Anesthesia Postprocedure Evaluation (Signed)
Anesthesia Post Note  Patient: [name] [name]  Procedure(s) Performed: TRANSESOPHAGEAL ECHOCARDIOGRAM (TEE) (N/A ) CARDIOVERSION (N/A )     Patient location during evaluation: Endoscopy Anesthesia Type: General Level of consciousness: awake and alert Pain management: pain level controlled Vital Signs Assessment: post-procedure vital signs reviewed and stable Respiratory status: spontaneous breathing, nonlabored ventilation and respiratory function stable Cardiovascular status: blood pressure returned to baseline and stable Postop Assessment: no apparent nausea or vomiting Anesthetic complications: no   No complications documented.  Last Vitals:  Vitals:   03/25/20 1028 03/25/20 1230  BP: 139/83 124/62  Pulse: (!) 133 89  Resp: 15 18  Temp: 36.9 C 36.6 C  SpO2:  98%    Last Pain:  Vitals:   03/25/20 1233  TempSrc:   PainSc: 0-No pain                 Toretto Tingler,W. EDMOND

## 2020-03-25 NOTE — Progress Notes (Signed)
Post DCCV with Dr. Oval Linsey, pt questioned if he should discontinue Multaq based on previous conversations with Dr. Irish Lack.  Endo RN spoke with Dr. Oval Linsey and Dr. Oval Linsey confirmed there were no medication changes.  Advised pt and spouse to contact Dr. Dianna Limbo to clarify Multaq orders.  Pt and spouse verbalized understanding.

## 2020-03-25 NOTE — Discharge Instructions (Signed)

## 2020-03-25 NOTE — CV Procedure (Signed)
Brief TEE Note  LVEF 60-65% Trivial TR and PR Mild MR No LAA thrombus or masses  For additional detail see full report.  Electrical Cardioversion Procedure Note MCCLAIN SHALL [id number] 02/01/1951  Procedure: Electrical Cardioversion Indications:  Atrial Fibrillation  Procedure Details Consent: Risks of procedure as well as the alternatives and risks of each were explained to the (patient/caregiver).  Consent for procedure obtained. Time Out: Verified patient identification, verified procedure, site/side was marked, verified correct patient position, special equipment/implants available, medications/allergies/relevent history reviewed, required imaging and test results available.  Performed  Patient placed on cardiac monitor, pulse oximetry, supplemental oxygen as necessary.  Sedation given: propofol Pacer pads placed anterior and posterior chest.  Cardioverted 1 time(s).  Cardioverted at [place].  Evaluation Findings: Post procedure EKG shows: NSR Complications: None Patient did tolerate procedure well.   Skeet Latch, MD 03/25/2020, 12:21 PM

## 2020-03-25 NOTE — Anesthesia Preprocedure Evaluation (Addendum)
Anesthesia Evaluation  Patient identified by MRN, date of birth, ID band Patient awake    Reviewed: Allergy & Precautions, H&P , NPO status , Patient's Chart, lab work & pertinent test results, reviewed documented beta blocker date and time   Airway Mallampati: II  TM Distance: >3 FB Neck ROM: Full    Dental no notable dental hx. (+) Teeth Intact, Dental Advisory Given   Pulmonary neg pulmonary ROS,    Pulmonary exam normal breath sounds clear to auscultation       Cardiovascular hypertension, Pt. on medications and Pt. on home beta blockers +CHF  + dysrhythmias Atrial Fibrillation  Rhythm:Irregular Rate:Tachycardia     Neuro/Psych  Headaches, negative psych ROS   GI/Hepatic Neg liver ROS, PUD, GERD  ,  Endo/Other  diabetes, Type 2, Oral Hypoglycemic AgentsHypothyroidism   Renal/GU negative Renal ROS  negative genitourinary   Musculoskeletal  (+) Arthritis , Osteoarthritis,    Abdominal   Peds  Hematology  (+) Blood dyscrasia, anemia ,   Anesthesia Other Findings   Reproductive/Obstetrics negative OB ROS                            Anesthesia Physical Anesthesia Plan  ASA: III  Anesthesia Plan: General   Post-op Pain Management:    Induction: Intravenous  PONV Risk Score and Plan: 2 and Propofol infusion and Treatment may vary due to age or medical condition  Airway Management Planned: Nasal Cannula  Additional Equipment:   Intra-op Plan:   Post-operative Plan:   Informed Consent: I have reviewed the patients History and Physical, chart, labs and discussed the procedure including the risks, benefits and alternatives for the proposed anesthesia with the patient or authorized representative who has indicated his/her understanding and acceptance.     Dental advisory given  Plan Discussed with: CRNA  Anesthesia Plan Comments:         Anesthesia Quick Evaluation

## 2020-03-25 NOTE — Interval H&P Note (Signed)
History and Physical Interval Note:  03/25/2020 12:02 PM  [name] [name]  has presented today for surgery, with the diagnosis of AFIB.  The various methods of treatment have been discussed with the patient and family. After consideration of risks, benefits and other options for treatment, the patient has consented to  Procedure(s): TRANSESOPHAGEAL ECHOCARDIOGRAM (TEE) (N/A) CARDIOVERSION (N/A) as a surgical intervention.  The patient's history has been reviewed, patient examined, no change in status, stable for surgery.  I have reviewed the patient's chart and labs.  Questions were answered to the patient's satisfaction.     Skeet Latch, MD

## 2020-03-25 NOTE — Transfer of Care (Signed)
Immediate Anesthesia Transfer of Care Note  Patient: [name] [name]  Procedure(s) Performed: TRANSESOPHAGEAL ECHOCARDIOGRAM (TEE) (N/A ) CARDIOVERSION (N/A )  Patient Location: Endoscopy Unit  Anesthesia Type:MAC  Level of Consciousness: awake, alert  and oriented  Airway & Oxygen Therapy: Patient Spontanous Breathing and Patient connected to nasal cannula oxygen  Post-op Assessment: Report given to RN and Post -op Vital signs reviewed and stable  Post vital signs: Reviewed and stable  Last Vitals:  Vitals Value Taken Time  BP 124/62 03/25/20 1232  Temp 36.6 C 03/25/20 1230  Pulse 87 03/25/20 1233  Resp 26 03/25/20 1233  SpO2 98 % 03/25/20 1233  Vitals shown include unvalidated device data.  Last Pain:  Vitals:   03/25/20 1230  TempSrc: Oral  PainSc:          Complications: No complications documented.

## 2020-03-25 NOTE — Anesthesia Procedure Notes (Signed)
Procedure Name: MAC Date/Time: 03/25/2020 11:51 AM Performed by: Candis Shine, CRNA Pre-anesthesia Checklist: Patient identified, Emergency Drugs available, Suction available and Patient being monitored Patient Re-evaluated:Patient Re-evaluated prior to induction Oxygen Delivery Method: Nasal cannula Dental Injury: Teeth and Oropharynx as per pre-operative assessment

## 2020-03-27 ENCOUNTER — Telehealth: Payer: Self-pay | Admitting: Cardiovascular Disease

## 2020-03-27 DIAGNOSIS — Z4789 Encounter for other orthopedic aftercare: Secondary | ICD-10-CM | POA: Diagnosis not present

## 2020-03-27 NOTE — Telephone Encounter (Signed)
OK 

## 2020-03-27 NOTE — Telephone Encounter (Signed)
[place] with me.  Thanks.  JV

## 2020-03-27 NOTE — Telephone Encounter (Signed)
Patient came into [facility] office about getting an appointment with Dr.[place]izton, patient stated he had cardioversion on 12/15 and he wanted to switch from Dr. Irish Lack  to [place] and was told that was okay. Please call patient once approved from providers to  schedule an appointment with [place].

## 2020-03-30 NOTE — Telephone Encounter (Signed)
Message sent to scheduling pool to contact pt and schedule appointment.

## 2020-04-01 ENCOUNTER — Ambulatory Visit (HOSPITAL_COMMUNITY)
Admission: RE | Admit: 2020-04-01 | Discharge: 2020-04-01 | Disposition: A | Discharge status: Home / Self Care | Payer: Medicare Other | Source: Ambulatory Visit | Attending: Nurse Practitioner | Admitting: Nurse Practitioner

## 2020-04-01 ENCOUNTER — Encounter (HOSPITAL_COMMUNITY): Payer: Self-pay | Admitting: Nurse Practitioner

## 2020-04-01 ENCOUNTER — Other Ambulatory Visit: Payer: Self-pay

## 2020-04-01 VITALS — BP 122/80 | HR 70 | Ht 68.0 in | Wt 207.6 lb

## 2020-04-01 DIAGNOSIS — Z7901 Long term (current) use of anticoagulants: Secondary | ICD-10-CM | POA: Insufficient documentation

## 2020-04-01 DIAGNOSIS — D6869 Other thrombophilia: Secondary | ICD-10-CM | POA: Diagnosis not present

## 2020-04-01 DIAGNOSIS — Z79899 Other long term (current) drug therapy: Secondary | ICD-10-CM | POA: Diagnosis not present

## 2020-04-01 DIAGNOSIS — I4892 Unspecified atrial flutter: Secondary | ICD-10-CM | POA: Insufficient documentation

## 2020-04-01 NOTE — Progress Notes (Addendum)
Primary Care Physician: Leanna Battles, MD Referring Physician: Dr. George Ina [name] [name] is a 69 y.o. male with a h/o 2 ablations and in 11/16 underwent a convergent afib ablation with Dr. Lehman Prom and Dr. Danise Mina at [facility]. He had some complications with cardiogenic shock per pt and had to be intubated and spend 4 days in intensive care. He has had 5 years of being afib free.   He went out of rhythm  last week and saw Dr. Barth Kirks, 12/9. He was started on Multaq 400 mg bid and referred here as he could not find a spot for an expedited cardioversion. Unfortunately, he vomited his pm dose of xarelto last Thursday but would still prefer to have a TEE guided cardioversion instead of waiting for 3 weeks uninterrupted anticoagulation as he is very symptomatic in afib. He was given 30 mg to use as needed for RVR, used one dose and it made his legs swell. He had rt wrist surgery 11/18 and is still wearing a cast rt arm.   F/u after successful cardioversion, 03/25/20. He remains in SR today. Qtc is acceptable at 449 ms. He will remain on Multaq 400 mg bid and discuss on  f/u with Dr. Oval Linsey if he is to remain on this .    Today, he denies symptoms of palpitations, chest pain, shortness of breath, orthopnea, PND, lower extremity edema, dizziness, presyncope, syncope, or neurologic sequela. The patient is tolerating medications without difficulties and is otherwise without complaint today.   Past Medical History:  Diagnosis Date  . Arthritis    OA  . Atrial flutter ([place])    a. s/p RFCA 8/13  . Cancer [facility]) 2010   prostate  . Chronic combined systolic and diastolic CHF (congestive heart failure) ([place])    a. LVEF previously 35% felt to be due tachycardia;  b. 02/2013 Echo: EF 50-55%, no rwma, mildly dil LA/RA, mild to mod MR. C 11/2014 echo - ef 60-65%, unable to determine DD  . CKD (chronic kidney disease), stage III (HCC)    hx of a/c renal failure during episode of diverticulitis 9/13 in  [place], [place]  . Diabetes mellitus ([place])    TYPE 2   . Diverticulitis   . Diverticulosis   . Dyslipidemia   . Dysrhythmia   . Erectile dysfunction   . Gastric ulcer    s/p prior surgery  . GERD (gastroesophageal reflux disease)   . Headache   . History of colonic diverticulitis   . History of prostate cancer   . Hypertension   . Hypothyroid   . Microcytic anemia   . Obesities, morbid ([place])   . Obstructive sleep apnea    noncompliant with CPAP  . Osteoporosis   . PAF (paroxysmal atrial fibrillation) ([place])    a. failed DCCV and multiple anti-arrhythmic drugs (multaq/flecainide);   b. s/p  PVI isolation with ablation of AFib 8/13; c. repeat PVI 01/2013;  d. 02/2013 repeat DCCV->Amio load/xarelto.  . Peptic ulcer disease 1994  . Renal calculi   . Sleep apnea 2015   has not had sleep apnea since heart intervention  . Thrombocytopenia ([place]) 1992   idopathic-treated with danazol  . Tubular adenoma of colon    Past Surgical History:  Procedure Laterality Date  . ATRIAL FIBRILLATION ABLATION  12/06/11; 02/05/2013   Afib and atrial flutter ablation by Dr Rayann Heman; repeat PVI by Dr Rayann Heman 02/05/2013  . ATRIAL FIBRILLATION ABLATION N/A 12/06/2011   Procedure: ATRIAL FIBRILLATION ABLATION;  Surgeon: Thompson Grayer, MD;  [facility] C[facility] LAB;  Service: Cardiovascular;  Laterality: N/A;  . ATRIAL FIBRILLATION ABLATION N/A 02/05/2013   Procedure: ATRIAL FIBRILLATION ABLATION;  Surgeon: Coralyn Mark, MD;  Location: [place] CATH LAB;  Service: Cardiovascular;  Laterality: N/A;  . benign stomach tumor removal  2000  . CARDIOVERSION  08/25/2011   Procedure: CARDIOVERSION;  Surgeon: Jettie Booze, MD;  Location: [place];  Service: Cardiovascular;  Laterality: N/A;  . CARDIOVERSION N/A 03/05/2013   Procedure: CARDIOVERSION;  Surgeon: Sinclair Grooms, MD;  Location: [place];  Service: Cardiovascular;  Laterality: N/A;  BEDSIDE   . CARDIOVERSION N/A 03/25/2020   Procedure: CARDIOVERSION;   Surgeon: Skeet Latch, MD;  Location: [place];  Service: Cardiovascular;  Laterality: N/A;  . CARPAL TUNNEL WITH CUBITAL TUNNEL Right 02/25/2020   Procedure: RIGHT CARPAL TUNNEL RELEASE, CUBITAL TUNNEL RELEASE IN SITU, RIGHT WRIST PROXIMAL ROW CARPECTOMY AND PROXIMAL CAPITATE HEAD REPLACEMENT/HEMIARTHROPLASTY WITH POSTERIOR INTEROSSOUS NERVE NEURECTOMY AND REPAIR;  Surgeon: Roseanne Kaufman, MD;  Location: [place];  Service: Orthopedics;  Laterality: Right;  2.5 hrs Block with IV Sedation  . convergent afib abaltion [facility] 02/26/15  02/26/15   [facility] by Dr. Lehman Prom and Dr. Danise Mina  . ELECTROPHYSIOLOGIC STUDY N/A 11/24/2014   Procedure: Cardioversion;  Surgeon: Will Meredith Leeds, MD;  Location: [place] CV LAB;  Service: Cardiovascular;  Laterality: N/A;  . ESOPHAGOGASTRODUODENOSCOPY N/A 04/09/2013   Procedure: ESOPHAGOGASTRODUODENOSCOPY (EGD) with possible Balloon Dilation;  Surgeon: Garlan Fair, MD;  Location: [facility] ENDOSCOPY;  Service: Endoscopy;  Laterality: N/A;  . kidney stone removal     multiple  . KNEE ARTHROSCOPY Bilateral 1979  . LAPAROSCOPIC RETROPUBIC PROSTATECTOMY  02/2007   hx prostate cancer  . ORIF ANKLE FRACTURE Right 10/2015  . ORIF ANKLE FRACTURE Right 11/03/2015   Procedure: ORIF RIGHT LISFRANK FOOT;  Surgeon: Newt Minion, MD;  Location: [place];  Service: Orthopedics;  Laterality: Right;  . TEE WITHOUT CARDIOVERSION  08/25/2011   Procedure: TRANSESOPHAGEAL ECHOCARDIOGRAM (TEE);  Surgeon: Jettie Booze, MD;  Location: [place];  Service: Cardiovascular;  Laterality: N/A;  . TEE WITHOUT CARDIOVERSION  11/09/2011   Procedure: TRANSESOPHAGEAL ECHOCARDIOGRAM (TEE);  Surgeon: Jettie Booze, MD;  Location: [facility] ENDOSCOPY;  Service: Cardiovascular;  Laterality: N/A;  h/p in file drawer  . TEE WITHOUT CARDIOVERSION N/A 02/05/2013   Procedure: TRANSESOPHAGEAL ECHOCARDIOGRAM (TEE);  Surgeon: Candee Furbish, MD;  Location: [facility] ENDOSCOPY;  Service: Cardiovascular;   Laterality: N/A;  . TEE WITHOUT CARDIOVERSION N/A 03/25/2020   Procedure: TRANSESOPHAGEAL ECHOCARDIOGRAM (TEE);  Surgeon: Skeet Latch, MD;  Location: [place];  Service: Cardiovascular;  Laterality: N/A;  . TOTAL THYROIDECTOMY  2009   benign nodules removed  . WRIST SURGERY Right 1977   with placement of lunate prosthesis    Current Outpatient Medications  Medication Sig Dispense Refill  . acetaminophen (TYLENOL) 500 MG tablet Take 1,000 mg by mouth every 6 (six) hours as needed for moderate pain or headache.    Marland Kitchen acetaZOLAMIDE (DIAMOX) 250 MG tablet Take 250 mg by mouth 2 (two) times daily.    Marland Kitchen ALPRAZolam (XANAX) 1 MG tablet Take 1 tablet (1 mg total) by mouth 3 (three) times daily as needed for anxiety. 30 tablet 0  . bismuth subsalicylate (PEPTO BISMOL) 262 MG/15ML suspension Take 30 mLs by mouth every 6 (six) hours as needed for indigestion or diarrhea or loose stools.    . Ca Carbonate-Mag Hydroxide (ROLAIDS PO) Take 2 tablets by mouth daily as  needed (heartburn).    . carvedilol (COREG) 25 MG tablet Take 25 mg by mouth 2 (two) times daily with a meal.    . Cyanocobalamin (B-12 PO) Take 1 tablet by mouth daily.    Marland Kitchen dronedarone (MULTAQ) 400 MG tablet Take 1 tablet (400 mg total) by mouth 2 (two) times daily with a meal. 60 tablet 11  . DULoxetine (CYMBALTA) 30 MG capsule Take 30 mg by mouth every morning.     . DULoxetine (CYMBALTA) 60 MG capsule Take 60 mg by mouth daily.    Marland Kitchen esomeprazole (NEXIUM) 40 MG capsule Take 40 mg by mouth daily.     Marland Kitchen FOLIC ACID PO Take 1 tablet by mouth daily.    Marland Kitchen GLUCOSAMINE PO Take 1 tablet by mouth 2 (two) times daily.     Marland Kitchen HYDROmorphone (DILAUDID) 2 MG tablet Take 2 mg by mouth 2 (two) times daily as needed (pain.).    Marland Kitchen imipramine (TOFRANIL) 25 MG tablet Take 50 mg by mouth at bedtime.    Marland Kitchen JARDIANCE 25 MG TABS tablet Take 25 mg by mouth daily.    Marland Kitchen levothyroxine (SYNTHROID) 175 MCG tablet Take 175 mcg by mouth daily.    Marland Kitchen losartan  (COZAAR) 50 MG tablet Take 50 mg by mouth daily.    Marland Kitchen LYSINE PO Take 1 tablet by mouth daily.    . Meclizine HCl (BONINE PO) Take 1 tablet by mouth daily as needed (nausea).    . metFORMIN (GLUCOPHAGE-XR) 500 MG 24 hr tablet Take 1,000 mg by mouth 2 (two) times daily.    . methocarbamol (ROBAXIN) 500 MG tablet Take 500 mg by mouth every 8 (eight) hours as needed for muscle spasms.    . Multiple Vitamins-Minerals (MENS 50+ MULTI VITAMIN/MIN PO) Take 1 tablet by mouth daily.    . Multiple Vitamins-Minerals (PRESERVISION AREDS 2) CAPS Take 1 capsule by mouth 2 (two) times daily.    . mupirocin ointment (BACTROBAN) 2 % SMARTSIG:1 Application Topical 2-3 Times Daily    . ondansetron (ZOFRAN) 4 MG tablet Take 4 mg by mouth 2 (two) times daily as needed for nausea.     Glory Rosebush VERIO test strip 1 each daily.    Marland Kitchen OZEMPIC, 0.25 OR 0.5 MG/DOSE, 2 MG/1.5ML SOPN Inject 0.5 mg into the skin every Sunday.     . polycarbophil (FIBERCON) 625 MG tablet Take 1,250 mg by mouth daily.    . potassium chloride SA (KLOR-CON) 20 MEQ tablet Take 40 mEq by mouth 2 (two) times daily.    . rivaroxaban (XARELTO) 20 MG TABS tablet Take 1 tablet (20 mg total) by mouth daily with supper. 90 tablet 1  . rosuvastatin (CRESTOR) 10 MG tablet Take 1 tablet (10 mg total) by mouth daily. 90 tablet 3  . tamsulosin (FLOMAX) 0.4 MG CAPS capsule Take 0.4 mg by mouth daily as needed (kidney stones).      Current Facility-Administered Medications  Medication Dose Route Frequency Provider Last Rate Last Admin  . 0.9 %  sodium chloride infusion  500 mL Intravenous Once Pyrtle, Lajuan Lines, MD        Allergies  Allergen Reactions  . Bystolic [Nebivolol Hcl] Swelling    Bradycardia   . Calcium Channel Blockers Swelling    LE edema  . Phenergan [Promethazine Hcl] Itching    Hallucination   . Cardizem [Diltiazem] Other (See Comments)    Foot swelling and increased his heart rate  . Aspirin Other (See Comments)  Hx of ulcer   .  Nsaids     Hx ulcer  . Prednisone Other (See Comments)    Hx of ulcer    Social History   Socioeconomic History  . Marital status: Married    Spouse name: Not on file  . Number of children: Not on file  . Years of education: Not on file  . Highest education level: Not on file  Occupational History  . Not on file  Tobacco Use  . Smoking status: Never Smoker  . Smokeless tobacco: Never Used  Vaping Use  . Vaping Use: Never used  Substance and Sexual Activity  . Alcohol use: No  . Drug use: No  . Sexual activity: Yes  Other Topics Concern  . Not on file  Social History Narrative   Lives in [place].  Works as a Librarian, academic at [employer]: Not on [employer] Insecurity: Not on file  Transportation Needs: Not on file  Physical Activity: Not on file  Stress: Not on file  Social Connections: Not on file  Intimate Partner Violence: Not on file    Family History  Problem Relation Age of Onset  . Emphysema Mother   . Lung cancer Father   . Diabetes Brother   . Atrial fibrillation Brother   . Throat cancer Sister   . Lung cancer Sister   . Colon cancer Neg Hx     ROS- All systems are reviewed and negative except as per the HPI above  Physical Exam: Vitals:   04/01/20 1107  BP: 122/80  Pulse: 70  Weight: 94.2 kg  Height: 5\' 8"  (1.727 m)   Wt Readings from Last 3 Encounters:  04/01/20 94.2 kg  03/25/20 94.3 kg  03/23/20 94.3 kg    Labs: Lab Results  Component Value Date   NA 138 03/23/2020   K 4.1 03/23/2020   CL 106 03/23/2020   CO2 22 03/23/2020   GLUCOSE 121 (H) 03/23/2020   BUN 17 03/23/2020   CREATININE 1.36 (H) 03/23/2020   CALCIUM 9.0 03/23/2020   MG 1.9 03/05/2013   Lab Results  Component Value Date   INR 1.88 (H) 10/31/2015   Lab Results  Component Value Date   CHOL 181 07/24/2013   HDL 52 07/24/2013   LDLCALC 109 (H) 07/24/2013   TRIG 99 07/24/2013     GEN-  The patient is well appearing, alert and oriented x 3 today.   Head- normocephalic, atraumatic Eyes-  Sclera clear, conjunctiva pink Ears- hearing intact Oropharynx- clear Neck- supple, no JVP Lymph- no cervical lymphadenopathy Lungs- Clear to ausculation bilaterally, normal work of breathing Heart- irregular rate and rhythm, no murmurs, rubs or gallops, PMI not laterally displaced GI- soft, NT, ND, + BS Extremities- no clubbing, cyanosis, or edema, cast present on rt lower arm  MS- no significant deformity or atrophy Skin- no rash or lesion Psych- euthymic mood, full affect Neuro- strength and sensation are intact  EKG-atrial flutter with variable block at 107 bpm    Assessment and Plan: 1. Atrial  flutter  Complex afib history with 2 ablations and an convergent procedure in 2016 Recent return of afib,  ? Trigger Successful cardioversion 12/15 Continue Multaq 400 mg bid that Dr. Irish Lack started last week  Can discuss with Dr. Oval Linsey if she wants to continue this drug on f/u in March  Continue carvedilol 25 mg bid   2. CHA2DS2VASc score of  6 Continue  xarelto 20 mg daily   F/u with Dr. Oval Linsey as scheduled 06/11/20  Geroge Baseman. Jessilynn Taft, [facility] [address] [place], [place] [address] [phone]

## 2020-04-01 NOTE — Addendum Note (Signed)
Encounter addended by: Sherran Needs, NP on: 04/01/2020 12:17 PM  Actions taken: Clinical Note Signed

## 2020-04-08 DIAGNOSIS — M19049 Primary osteoarthritis, unspecified hand: Secondary | ICD-10-CM | POA: Diagnosis not present

## 2020-04-08 DIAGNOSIS — M19041 Primary osteoarthritis, right hand: Secondary | ICD-10-CM | POA: Diagnosis not present

## 2020-04-08 DIAGNOSIS — Z4789 Encounter for other orthopedic aftercare: Secondary | ICD-10-CM | POA: Diagnosis not present

## 2020-04-08 DIAGNOSIS — M25531 Pain in right wrist: Secondary | ICD-10-CM | POA: Diagnosis not present

## 2020-04-16 DIAGNOSIS — M25531 Pain in right wrist: Secondary | ICD-10-CM | POA: Diagnosis not present

## 2020-04-20 MED ORDER — MULTAQ 400 MG PO TABS: 400.0000 mg | ORAL_TABLET | Freq: Two times a day (BID) | ORAL | 0 refills | Status: DC

## 2020-04-20 NOTE — Addendum Note (Signed)
Addended by: Leotis Pain, [employer] on: 04/20/2020 09:58 AM   Modules accepted: Orders

## 2020-04-22 DIAGNOSIS — I129 Hypertensive chronic kidney disease with stage 1 through stage 4 chronic kidney disease, or unspecified chronic kidney disease: Secondary | ICD-10-CM | POA: Diagnosis not present

## 2020-04-22 DIAGNOSIS — N182 Chronic kidney disease, stage 2 (mild): Secondary | ICD-10-CM | POA: Diagnosis not present

## 2020-04-22 DIAGNOSIS — E1129 Type 2 diabetes mellitus with other diabetic kidney complication: Secondary | ICD-10-CM | POA: Diagnosis not present

## 2020-04-27 ENCOUNTER — Other Ambulatory Visit: Payer: Self-pay | Admitting: Interventional Cardiology

## 2020-04-27 DIAGNOSIS — I4821 Permanent atrial fibrillation: Secondary | ICD-10-CM

## 2020-04-27 NOTE — Telephone Encounter (Signed)
Prescription refill request for Xarelto received.  Indication: PAF Last office visit: Weight: 207 Age: 70 Scr: 1.36 CrCl: 68 mL/min

## 2020-05-06 DIAGNOSIS — Z4789 Encounter for other orthopedic aftercare: Secondary | ICD-10-CM | POA: Diagnosis not present

## 2020-05-06 DIAGNOSIS — M13841 Other specified arthritis, right hand: Secondary | ICD-10-CM | POA: Diagnosis not present

## 2020-05-12 DIAGNOSIS — I480 Paroxysmal atrial fibrillation: Secondary | ICD-10-CM | POA: Diagnosis not present

## 2020-05-12 DIAGNOSIS — E1121 Type 2 diabetes mellitus with diabetic nephropathy: Secondary | ICD-10-CM | POA: Diagnosis not present

## 2020-05-12 DIAGNOSIS — M19031 Primary osteoarthritis, right wrist: Secondary | ICD-10-CM | POA: Diagnosis not present

## 2020-05-12 DIAGNOSIS — G4733 Obstructive sleep apnea (adult) (pediatric): Secondary | ICD-10-CM | POA: Diagnosis not present

## 2020-05-12 DIAGNOSIS — I10 Essential (primary) hypertension: Secondary | ICD-10-CM | POA: Diagnosis not present

## 2020-05-26 ENCOUNTER — Telehealth: Payer: Self-pay | Admitting: Cardiovascular Disease

## 2020-05-26 ENCOUNTER — Encounter: Payer: Self-pay | Admitting: Interventional Cardiology

## 2020-05-26 ENCOUNTER — Other Ambulatory Visit: Payer: Self-pay

## 2020-05-26 ENCOUNTER — Encounter (HOSPITAL_COMMUNITY): Payer: Self-pay | Admitting: Nurse Practitioner

## 2020-05-26 ENCOUNTER — Ambulatory Visit (HOSPITAL_COMMUNITY)
Admission: RE | Admit: 2020-05-26 | Discharge: 2020-05-26 | Disposition: A | Discharge status: Home / Self Care | Payer: Medicare Other | Source: Ambulatory Visit | Attending: Nurse Practitioner | Admitting: Nurse Practitioner

## 2020-05-26 VITALS — BP 126/84 | HR 88 | Ht 68.0 in | Wt 207.8 lb

## 2020-05-26 DIAGNOSIS — Z8249 Family history of ischemic heart disease and other diseases of the circulatory system: Secondary | ICD-10-CM | POA: Diagnosis not present

## 2020-05-26 DIAGNOSIS — Z888 Allergy status to other drugs, medicaments and biological substances status: Secondary | ICD-10-CM | POA: Diagnosis not present

## 2020-05-26 DIAGNOSIS — I4891 Unspecified atrial fibrillation: Secondary | ICD-10-CM | POA: Diagnosis not present

## 2020-05-26 DIAGNOSIS — I4892 Unspecified atrial flutter: Secondary | ICD-10-CM | POA: Diagnosis not present

## 2020-05-26 DIAGNOSIS — Z79899 Other long term (current) drug therapy: Secondary | ICD-10-CM | POA: Insufficient documentation

## 2020-05-26 DIAGNOSIS — Z7901 Long term (current) use of anticoagulants: Secondary | ICD-10-CM | POA: Insufficient documentation

## 2020-05-26 DIAGNOSIS — Z8674 Personal history of sudden cardiac arrest: Secondary | ICD-10-CM | POA: Diagnosis not present

## 2020-05-26 DIAGNOSIS — D6869 Other thrombophilia: Secondary | ICD-10-CM | POA: Diagnosis not present

## 2020-05-26 DIAGNOSIS — Z886 Allergy status to analgesic agent status: Secondary | ICD-10-CM | POA: Diagnosis not present

## 2020-05-26 LAB — BASIC METABOLIC PANEL
Anion gap: 9 (ref 5–15)
BUN: 22 mg/dL (ref 8–23)
CO2: 21 mmol/L — ABNORMAL LOW (ref 22–32)
Calcium: 9.3 mg/dL (ref 8.9–10.3)
Chloride: 112 mmol/L — ABNORMAL HIGH (ref 98–111)
Creatinine, Ser: 1.3 mg/dL — ABNORMAL HIGH (ref 0.61–1.24)
GFR, Estimated: 59 mL/min — ABNORMAL LOW (ref >60)
Glucose, Bld: 152 mg/dL — ABNORMAL HIGH (ref 70–99)
Potassium: 4.7 mmol/L (ref 3.5–5.1)
Sodium: 142 mmol/L (ref 135–145)

## 2020-05-26 LAB — CBC
HCT: 45.4 % (ref 39.0–52.0)
Hemoglobin: 13.8 g/dL (ref 13.0–17.0)
MCH: 27.5 pg (ref 26.0–34.0)
MCHC: 30.4 g/dL (ref 30.0–36.0)
MCV: 90.6 fL (ref 80.0–100.0)
Platelets: 225 10*3/uL (ref 150–400)
RBC: 5.01 MIL/uL (ref 4.22–5.81)
RDW: 16.5 % — ABNORMAL HIGH (ref 11.5–15.5)
WBC: 7.5 10*3/uL (ref 4.0–10.5)
nRBC: 0 % (ref 0.0–0.2)

## 2020-05-26 NOTE — Telephone Encounter (Signed)
Pt wife called to AF clinic - pt has just taken his morning medications - asked that they call back with update of HR/BP after about 1 hour of medications being taken. Wife will call back.

## 2020-05-26 NOTE — H&P (View-Only) (Signed)
Primary Care Physician: Leanna Battles, MD Referring Physician: Dr. George Ina [name] [name] is a 70 y.o. male with a h/o 2 ablations and in 11/16 underwent a convergent afib ablation with Dr. Lehman Prom and Dr. Danise Mina at [facility]. He had some complications with cardiogenic shock per pt and had to be intubated and spend 4 days in intensive care. He has had 5 years of being afib free.   He went out of rhythm  last week and saw Dr. Barth Kirks, 12/9. He was started on Multaq 400 mg bid and referred here as he could not find a spot for an expedited cardioversion. Unfortunately, he vomited his pm dose of xarelto last Thursday but would still prefer to have a TEE guided cardioversion instead of waiting for 3 weeks uninterrupted anticoagulation as he is very symptomatic in afib. He was given 30 mg to use as needed for RVR, used one dose and it made his legs swell. He had rt wrist surgery 11/18 and is still wearing a cast rt arm.  F/u in afib clinic, 05/26/20. He went back into afib this am, initially with heart rate around 150 bpm. Now he is in afib with HR's in the 80's. He is just getting over aa UTI and finishes his antibiotic today. He just had a cardioversion mid December after being in [place] for 5 years following convergent procedure at [facility]. He continues on Multaq 400 mg bid.   Today, he denies symptoms of palpitations, chest pain, shortness of breath, orthopnea, PND, lower extremity edema, dizziness, presyncope, syncope, or neurologic sequela. The patient is tolerating medications without difficulties and is otherwise without complaint today.   Past Medical History:  Diagnosis Date  . Arthritis    OA  . Atrial flutter ([place])    a. s/p RFCA 8/13  . Cancer [facility]) 2010   prostate  . Chronic combined systolic and diastolic CHF (congestive heart failure) ([place])    a. LVEF previously 35% felt to be due tachycardia;  b. 02/2013 Echo: EF 50-55%, no rwma, mildly dil LA/RA, mild to mod MR. C 11/2014 echo - ef  60-65%, unable to determine DD  . CKD (chronic kidney disease), stage III (HCC)    hx of a/c renal failure during episode of diverticulitis 9/13 in [place], [place]  . Diabetes mellitus ([place])    TYPE 2   . Diverticulitis   . Diverticulosis   . Dyslipidemia   . Dysrhythmia   . Erectile dysfunction   . Gastric ulcer    s/p prior surgery  . GERD (gastroesophageal reflux disease)   . Headache   . History of colonic diverticulitis   . History of prostate cancer   . Hypertension   . Hypothyroid   . Microcytic anemia   . Obesities, morbid ([place])   . Obstructive sleep apnea    noncompliant with CPAP  . Osteoporosis   . PAF (paroxysmal atrial fibrillation) ([place])    a. failed DCCV and multiple anti-arrhythmic drugs (multaq/flecainide);   b. s/p  PVI isolation with ablation of AFib 8/13; c. repeat PVI 01/2013;  d. 02/2013 repeat DCCV->Amio load/xarelto.  . Peptic ulcer disease 1994  . Renal calculi   . Sleep apnea 2015   has not had sleep apnea since heart intervention  . Thrombocytopenia ([place]) 1992   idopathic-treated with danazol  . Tubular adenoma of colon    Past Surgical History:  Procedure Laterality Date  . ATRIAL FIBRILLATION ABLATION  12/06/11; 02/05/2013   Afib and atrial  flutter ablation by Dr Rayann Heman; repeat PVI by Dr Rayann Heman 02/05/2013  . ATRIAL FIBRILLATION ABLATION N/A 12/06/2011   Procedure: ATRIAL FIBRILLATION ABLATION;  Surgeon: Thompson Grayer, MD;  Location: [facility] CATH LAB;  Service: Cardiovascular;  Laterality: N/A;  . ATRIAL FIBRILLATION ABLATION N/A 02/05/2013   Procedure: ATRIAL FIBRILLATION ABLATION;  Surgeon: Coralyn Mark, MD;  Location: [facility] CATH LAB;  Service: Cardiovascular;  Laterality: N/A;  . benign stomach tumor removal  2000  . CARDIOVERSION  08/25/2011   Procedure: CARDIOVERSION;  Surgeon: Jettie Booze, MD;  Location: [place];  Service: Cardiovascular;  Laterality: N/A;  . CARDIOVERSION N/A 03/05/2013   Procedure: CARDIOVERSION;  Surgeon: Sinclair Grooms, MD;  Location: [place];  Service: Cardiovascular;  Laterality: N/A;  BEDSIDE   . CARDIOVERSION N/A 03/25/2020   Procedure: CARDIOVERSION;  Surgeon: Skeet Latch, MD;  Location: [place];  Service: Cardiovascular;  Laterality: N/A;  . CARPAL TUNNEL WITH CUBITAL TUNNEL Right 02/25/2020   Procedure: RIGHT CARPAL TUNNEL RELEASE, CUBITAL TUNNEL RELEASE IN SITU, RIGHT WRIST PROXIMAL ROW CARPECTOMY AND PROXIMAL CAPITATE HEAD REPLACEMENT/HEMIARTHROPLASTY WITH POSTERIOR INTEROSSOUS NERVE NEURECTOMY AND REPAIR;  Surgeon: Roseanne Kaufman, MD;  Location: [place];  Service: Orthopedics;  Laterality: Right;  2.5 hrs Block with IV Sedation  . convergent afib abaltion [facility] 02/26/15  02/26/15   [facility] by Dr. Lehman Prom and Dr. Danise Mina  . ELECTROPHYSIOLOGIC STUDY N/A 11/24/2014   Procedure: Cardioversion;  Surgeon: Will Meredith Leeds, MD;  Location: [place] CV LAB;  Service: Cardiovascular;  Laterality: N/A;  . ESOPHAGOGASTRODUODENOSCOPY N/A 04/09/2013   Procedure: ESOPHAGOGASTRODUODENOSCOPY (EGD) with possible Balloon Dilation;  Surgeon: Garlan Fair, MD;  Location: [facility] ENDOSCOPY;  Service: Endoscopy;  Laterality: N/A;  . kidney stone removal     multiple  . KNEE ARTHROSCOPY Bilateral 1979  . LAPAROSCOPIC RETROPUBIC PROSTATECTOMY  02/2007   hx prostate cancer  . ORIF ANKLE FRACTURE Right 10/2015  . ORIF ANKLE FRACTURE Right 11/03/2015   Procedure: ORIF RIGHT LISFRANK FOOT;  Surgeon: Newt Minion, MD;  Location: [place];  Service: Orthopedics;  Laterality: Right;  . TEE WITHOUT CARDIOVERSION  08/25/2011   Procedure: TRANSESOPHAGEAL ECHOCARDIOGRAM (TEE);  Surgeon: Jettie Booze, MD;  Location: [place];  Service: Cardiovascular;  Laterality: N/A;  . TEE WITHOUT CARDIOVERSION  11/09/2011   Procedure: TRANSESOPHAGEAL ECHOCARDIOGRAM (TEE);  Surgeon: Jettie Booze, MD;  Location: [facility] ENDOSCOPY;  Service: Cardiovascular;  Laterality: N/A;  h/p in file drawer  . TEE WITHOUT  CARDIOVERSION N/A 02/05/2013   Procedure: TRANSESOPHAGEAL ECHOCARDIOGRAM (TEE);  Surgeon: Candee Furbish, MD;  Location: [facility] ENDOSCOPY;  Service: Cardiovascular;  Laterality: N/A;  . TEE WITHOUT CARDIOVERSION N/A 03/25/2020   Procedure: TRANSESOPHAGEAL ECHOCARDIOGRAM (TEE);  Surgeon: Skeet Latch, MD;  Location: [place];  Service: Cardiovascular;  Laterality: N/A;  . TOTAL THYROIDECTOMY  2009   benign nodules removed  . WRIST SURGERY Right 1977   with placement of lunate prosthesis    Current Outpatient Medications  Medication Sig Dispense Refill  . acetaminophen (TYLENOL) 500 MG tablet Take 1,000 mg by mouth every 6 (six) hours as needed for moderate pain or headache.    . ALPRAZolam (XANAX) 1 MG tablet Take 1 tablet (1 mg total) by mouth 3 (three) times daily as needed for anxiety. 30 tablet 0  . bismuth subsalicylate (PEPTO BISMOL) 262 MG/15ML suspension Take 30 mLs by mouth every 6 (six) hours as needed for indigestion or diarrhea or loose stools.    . carvedilol (COREG) 25 MG tablet  Take 25 mg by mouth 2 (two) times daily with a meal.    . Cyanocobalamin (B-12 PO) Take 1 tablet by mouth daily.    Marland Kitchen dronedarone (MULTAQ) 400 MG tablet Take 1 tablet (400 mg total) by mouth 2 (two) times daily with a meal. 180 tablet 0  . DULoxetine (CYMBALTA) 60 MG capsule Take 60 mg by mouth daily.    Marland Kitchen esomeprazole (NEXIUM) 40 MG capsule Take 40 mg by mouth daily.     Marland Kitchen FOLIC ACID PO Take 1 tablet by mouth daily.    Marland Kitchen GLUCOSAMINE PO Take 1 tablet by mouth 2 (two) times daily.     Marland Kitchen HYDROmorphone (DILAUDID) 2 MG tablet Take 2 mg by mouth 2 (two) times daily as needed (pain.).    Marland Kitchen JARDIANCE 25 MG TABS tablet Take 25 mg by mouth daily.    Marland Kitchen levothyroxine (SYNTHROID) 175 MCG tablet Take 175 mcg by mouth daily.    Marland Kitchen losartan (COZAAR) 50 MG tablet Take 50 mg by mouth daily.    Marland Kitchen LYSINE PO Take 1 tablet by mouth daily.    . Meclizine HCl (BONINE PO) Take 1 tablet by mouth daily as needed (nausea).     . metFORMIN (GLUCOPHAGE-XR) 500 MG 24 hr tablet Take 1,000 mg by mouth 2 (two) times daily.    . methocarbamol (ROBAXIN) 500 MG tablet Take 500 mg by mouth every 8 (eight) hours as needed for muscle spasms.    . Multiple Vitamins-Minerals (MENS 50+ MULTI VITAMIN/MIN PO) Take 1 tablet by mouth daily.    . Multiple Vitamins-Minerals (PRESERVISION AREDS 2) CAPS Take 1 capsule by mouth 2 (two) times daily.    . mupirocin ointment (BACTROBAN) 2 % SMARTSIG:1 Application Topical 2-3 Times Daily    . ondansetron (ZOFRAN) 4 MG tablet Take 4 mg by mouth 2 (two) times daily as needed for nausea.     Glory Rosebush VERIO test strip 1 each daily.    Marland Kitchen OZEMPIC, 0.25 OR 0.5 MG/DOSE, 2 MG/1.5ML SOPN Inject 0.5 mg into the skin every Sunday.     . polycarbophil (FIBERCON) 625 MG tablet Take 1,250 mg by mouth daily.    . potassium chloride SA (KLOR-CON) 20 MEQ tablet Take 40 mEq by mouth 2 (two) times daily.    . rosuvastatin (CRESTOR) 10 MG tablet Take 1 tablet (10 mg total) by mouth daily. 90 tablet 3  . tamsulosin (FLOMAX) 0.4 MG CAPS capsule Take 0.4 mg by mouth daily as needed (kidney stones).     Alveda Reasons 20 MG TABS tablet TAKE 1 TABLET DAILY WITH   SUPPER 90 tablet 3   Current Facility-Administered Medications  Medication Dose Route Frequency Provider Last Rate Last Admin  . 0.9 %  sodium chloride infusion  500 mL Intravenous Once Pyrtle, Lajuan Lines, MD        Allergies  Allergen Reactions  . Bystolic [Nebivolol Hcl] Swelling    Bradycardia   . Calcium Channel Blockers Swelling    LE edema  . Phenergan [Promethazine Hcl] Itching    Hallucination   . Beta Adrenergic Blockers Other (See Comments)  . Cardizem [Diltiazem] Other (See Comments)    Foot swelling and increased his heart rate  . Norvasc [Amlodipine] Other (See Comments)  . Promethazine Itching and Other (See Comments)  . Aspirin Other (See Comments)    Hx of ulcer   . Nsaids     Hx ulcer  . Prednisone Other (See Comments)    Hx of  ulcer    Social History   Socioeconomic History  . Marital status: Married    Spouse name: Not on file  . Number of children: Not on file  . Years of education: Not on file  . Highest education level: Not on file  Occupational History  . Not on file  Tobacco Use  . Smoking status: Never Smoker  . Smokeless tobacco: Never Used  Vaping Use  . Vaping Use: Never used  Substance and Sexual Activity  . Alcohol use: No  . Drug use: No  . Sexual activity: Yes  Other Topics Concern  . Not on file  Social History Narrative   Lives in [place].  Works as a Librarian, academic at [employer]: Not on [employer] Insecurity: Not on file  Transportation Needs: Not on file  Physical Activity: Not on file  Stress: Not on file  Social Connections: Not on file  Intimate Partner Violence: Not on file    Family History  Problem Relation Age of Onset  . Emphysema Mother   . Lung cancer Father   . Diabetes Brother   . Atrial fibrillation Brother   . Throat cancer Sister   . Lung cancer Sister   . Colon cancer Neg Hx     ROS- All systems are reviewed and negative except as per the HPI above  Physical Exam: Vitals:   05/26/20 1504  BP: 126/84  Pulse: 88  Weight: 94.3 kg  Height: 5\' 8"  (1.727 m)   Wt Readings from Last 3 Encounters:  05/26/20 94.3 kg  04/01/20 94.2 kg  03/25/20 94.3 kg    Labs: Lab Results  Component Value Date   NA 138 03/23/2020   K 4.1 03/23/2020   CL 106 03/23/2020   CO2 22 03/23/2020   GLUCOSE 121 (H) 03/23/2020   BUN 17 03/23/2020   CREATININE 1.36 (H) 03/23/2020   CALCIUM 9.0 03/23/2020   MG 1.9 03/05/2013   Lab Results  Component Value Date   INR 1.88 (H) 10/31/2015   Lab Results  Component Value Date   CHOL 181 07/24/2013   HDL 52 07/24/2013   LDLCALC 109 (H) 07/24/2013   TRIG 99 07/24/2013     GEN- The patient is well appearing, alert and oriented x 3 today.   Head-  normocephalic, atraumatic Eyes-  Sclera clear, conjunctiva pink Ears- hearing intact Oropharynx- clear Neck- supple, no JVP Lymph- no cervical lymphadenopathy Lungs- Clear to ausculation bilaterally, normal work of breathing Heart- irregular rate and rhythm, no murmurs, rubs or gallops, PMI not laterally displaced GI- soft, NT, ND, + BS Extremities- no clubbing, cyanosis, or edema, cast present on rt lower arm  MS- no significant deformity or atrophy Skin- no rash or lesion Psych- euthymic mood, full affect Neuro- strength and sensation are intact  EKG-  atrial flutter with variable AV block at 88 bpm.     Assessment and Plan: 1. Atrial  flutter  Complex afib history with 2 ablations and an convergent procedure in 2016 Last cardioversion was in mid December I will set up for DCCV  with UTI possibly being a trigger But if ERAF, he may have to consider tikosyn use  Continue Multaq 400 mg bid  Continue carvedilol 25 mg bid   Cbc/bmet/covid test scheduled   2. CHA2DS2VASc score of 6 Continue  xarelto 20 mg daily States no missed doses x 3 weeks   F/u with Dr.  Oval Linsey 3/3 as scheduled   Geroge Baseman. Latrel Szymczak, [facility] [address] [place], [place] [address] [phone]

## 2020-05-26 NOTE — Telephone Encounter (Signed)
Will bring in for assessment today as pt is still in AF.

## 2020-05-26 NOTE — Patient Instructions (Signed)
Cardioversion scheduled for Tuesday, February 22nd  - Arrive at the [facility] and go to admitting at 930AM  - Do not eat or drink anything after midnight the night prior to your procedure.  - Take all your morning medication (except diabetic medications) with a sip of water prior to arrival.  - You will not be able to drive home after your procedure.  - Do NOT miss any doses of your blood thinner - if you should miss a dose please notify our office immediately.  - If you feel as if you go back into normal rhythm prior to scheduled cardioversion, please notify our office immediately. If your procedure is canceled in the cardioversion suite you will be charged a cancellation fee.

## 2020-05-26 NOTE — Telephone Encounter (Signed)
Patient c/o Palpitations:  High priority if patient c/o lightheadedness, shortness of breath, or chest pain  1) How long have you had palpitations/irregular HR/ Afib? Are you having the symptoms now? Patient's wife states the patient went into afib at 1:30 AM and he is still in afib  2) Are you currently experiencing lightheadedness, SOB or CP? SOB  3) Do you have a history of afib (atrial fibrillation) or irregular heart rhythm? Yes   4) Have you checked your BP or HR? (document readings if available):  BP- 142/94 HR- 142  5) Are you experiencing any other symptoms? SOB  Pt c/o Shortness Of Breath: STAT if SOB developed within the last 24 hours or pt is noticeably SOB on the phone  1. Are you currently SOB (can you hear that pt is SOB on the phone)? Yes   2. How long have you been experiencing SOB? Since around 1:30 AM   3. Are you SOB when sitting or when up moving around? Both   4. Are you currently experiencing any other symptoms? Dizziness    STAT if patient feels like he/she is going to faint   1) Are you dizzy now? Yes  2) Do you feel faint or have you passed out? No  3) Do you have any other symptoms? No   4) Have you checked your HR and BP (record if available)?  BP- 142/94 HR- 142

## 2020-05-26 NOTE — Progress Notes (Signed)
Primary Care Physician: Leanna Battles, MD Referring Physician: Dr. George Ina [name] [name] is a 70 y.o. male with a h/o 2 ablations and in 11/16 underwent a convergent afib ablation with Dr. Lehman Prom and Dr. Danise Mina at [facility]. He had some complications with cardiogenic shock per pt and had to be intubated and spend 4 days in intensive care. He has had 5 years of being afib free.   He went out of rhythm  last week and saw Dr. Barth Kirks, 12/9. He was started on Multaq 400 mg bid and referred here as he could not find a spot for an expedited cardioversion. Unfortunately, he vomited his pm dose of xarelto last Thursday but would still prefer to have a TEE guided cardioversion instead of waiting for 3 weeks uninterrupted anticoagulation as he is very symptomatic in afib. He was given 30 mg to use as needed for RVR, used one dose and it made his legs swell. He had rt wrist surgery 11/18 and is still wearing a cast rt arm.  F/u in afib clinic, 05/26/20. He went back into afib this am, initially with heart rate around 150 bpm. Now he is in afib with HR's in the 80's. He is just getting over aa UTI and finishes his antibiotic today. He just had a cardioversion mid December after being in [place] for 5 years following convergent procedure at [facility]. He continues on Multaq 400 mg bid.   Today, he denies symptoms of palpitations, chest pain, shortness of breath, orthopnea, PND, lower extremity edema, dizziness, presyncope, syncope, or neurologic sequela. The patient is tolerating medications without difficulties and is otherwise without complaint today.   Past Medical History:  Diagnosis Date  . Arthritis    OA  . Atrial flutter ([place])    a. s/p RFCA 8/13  . Cancer [facility]) 2010   prostate  . Chronic combined systolic and diastolic CHF (congestive heart failure) ([place])    a. LVEF previously 35% felt to be due tachycardia;  b. 02/2013 Echo: EF 50-55%, no rwma, mildly dil LA/RA, mild to mod MR. C 11/2014 echo - ef  60-65%, unable to determine DD  . CKD (chronic kidney disease), stage III (HCC)    hx of a/c renal failure during episode of diverticulitis 9/13 in [place], [place]  . Diabetes mellitus ([place])    TYPE 2   . Diverticulitis   . Diverticulosis   . Dyslipidemia   . Dysrhythmia   . Erectile dysfunction   . Gastric ulcer    s/p prior surgery  . GERD (gastroesophageal reflux disease)   . Headache   . History of colonic diverticulitis   . History of prostate cancer   . Hypertension   . Hypothyroid   . Microcytic anemia   . Obesities, morbid ([place])   . Obstructive sleep apnea    noncompliant with CPAP  . Osteoporosis   . PAF (paroxysmal atrial fibrillation) ([place])    a. failed DCCV and multiple anti-arrhythmic drugs (multaq/flecainide);   b. s/p  PVI isolation with ablation of AFib 8/13; c. repeat PVI 01/2013;  d. 02/2013 repeat DCCV->Amio load/xarelto.  . Peptic ulcer disease 1994  . Renal calculi   . Sleep apnea 2015   has not had sleep apnea since heart intervention  . Thrombocytopenia ([place]) 1992   idopathic-treated with danazol  . Tubular adenoma of colon    Past Surgical History:  Procedure Laterality Date  . ATRIAL FIBRILLATION ABLATION  12/06/11; 02/05/2013   Afib and atrial  flutter ablation by Dr Rayann Heman; repeat PVI by Dr Rayann Heman 02/05/2013  . ATRIAL FIBRILLATION ABLATION N/A 12/06/2011   Procedure: ATRIAL FIBRILLATION ABLATION;  Surgeon: Thompson Grayer, MD;  Location: [facility] CATH LAB;  Service: Cardiovascular;  Laterality: N/A;  . ATRIAL FIBRILLATION ABLATION N/A 02/05/2013   Procedure: ATRIAL FIBRILLATION ABLATION;  Surgeon: Coralyn Mark, MD;  Location: [facility] CATH LAB;  Service: Cardiovascular;  Laterality: N/A;  . benign stomach tumor removal  2000  . CARDIOVERSION  08/25/2011   Procedure: CARDIOVERSION;  Surgeon: Jettie Booze, MD;  Location: [place];  Service: Cardiovascular;  Laterality: N/A;  . CARDIOVERSION N/A 03/05/2013   Procedure: CARDIOVERSION;  Surgeon: Sinclair Grooms, MD;  Location: [place];  Service: Cardiovascular;  Laterality: N/A;  BEDSIDE   . CARDIOVERSION N/A 03/25/2020   Procedure: CARDIOVERSION;  Surgeon: Skeet Latch, MD;  Location: [place];  Service: Cardiovascular;  Laterality: N/A;  . CARPAL TUNNEL WITH CUBITAL TUNNEL Right 02/25/2020   Procedure: RIGHT CARPAL TUNNEL RELEASE, CUBITAL TUNNEL RELEASE IN SITU, RIGHT WRIST PROXIMAL ROW CARPECTOMY AND PROXIMAL CAPITATE HEAD REPLACEMENT/HEMIARTHROPLASTY WITH POSTERIOR INTEROSSOUS NERVE NEURECTOMY AND REPAIR;  Surgeon: Roseanne Kaufman, MD;  Location: [place];  Service: Orthopedics;  Laterality: Right;  2.5 hrs Block with IV Sedation  . convergent afib abaltion [facility] 02/26/15  02/26/15   [facility] by Dr. Lehman Prom and Dr. Danise Mina  . ELECTROPHYSIOLOGIC STUDY N/A 11/24/2014   Procedure: Cardioversion;  Surgeon: Will Meredith Leeds, MD;  Location: [place] CV LAB;  Service: Cardiovascular;  Laterality: N/A;  . ESOPHAGOGASTRODUODENOSCOPY N/A 04/09/2013   Procedure: ESOPHAGOGASTRODUODENOSCOPY (EGD) with possible Balloon Dilation;  Surgeon: Garlan Fair, MD;  Location: [facility] ENDOSCOPY;  Service: Endoscopy;  Laterality: N/A;  . kidney stone removal     multiple  . KNEE ARTHROSCOPY Bilateral 1979  . LAPAROSCOPIC RETROPUBIC PROSTATECTOMY  02/2007   hx prostate cancer  . ORIF ANKLE FRACTURE Right 10/2015  . ORIF ANKLE FRACTURE Right 11/03/2015   Procedure: ORIF RIGHT LISFRANK FOOT;  Surgeon: Newt Minion, MD;  Location: [place];  Service: Orthopedics;  Laterality: Right;  . TEE WITHOUT CARDIOVERSION  08/25/2011   Procedure: TRANSESOPHAGEAL ECHOCARDIOGRAM (TEE);  Surgeon: Jettie Booze, MD;  Location: [place];  Service: Cardiovascular;  Laterality: N/A;  . TEE WITHOUT CARDIOVERSION  11/09/2011   Procedure: TRANSESOPHAGEAL ECHOCARDIOGRAM (TEE);  Surgeon: Jettie Booze, MD;  Location[facility] ENDOSCOPY;  Service: Cardiovascular;  Laterality: N/A;  h/p in file drawer  . TEE WITHOUT  CARDIOVERSION N/A 02/05/2013   Procedure: TRANSESOPHAGEAL ECHOCARDIOGRAM (TEE);  Surgeon: Candee Furbish, MD;  Location: [facility] ENDOSCOPY;  Service: Cardiovascular;  Laterality: N/A;  . TEE WITHOUT CARDIOVERSION N/A 03/25/2020   Procedure: TRANSESOPHAGEAL ECHOCARDIOGRAM (TEE);  Surgeon: Skeet Latch, MD;  Location: [place];  Service: Cardiovascular;  Laterality: N/A;  . TOTAL THYROIDECTOMY  2009   benign nodules removed  . WRIST SURGERY Right 1977   with placement of lunate prosthesis    Current Outpatient Medications  Medication Sig Dispense Refill  . acetaminophen (TYLENOL) 500 MG tablet Take 1,000 mg by mouth every 6 (six) hours as needed for moderate pain or headache.    . ALPRAZolam (XANAX) 1 MG tablet Take 1 tablet (1 mg total) by mouth 3 (three) times daily as needed for anxiety. 30 tablet 0  . bismuth subsalicylate (PEPTO BISMOL) 262 MG/15ML suspension Take 30 mLs by mouth every 6 (six) hours as needed for indigestion or diarrhea or loose stools.    . carvedilol (COREG) 25 MG tablet  Take 25 mg by mouth 2 (two) times daily with a meal.    . Cyanocobalamin (B-12 PO) Take 1 tablet by mouth daily.    Marland Kitchen dronedarone (MULTAQ) 400 MG tablet Take 1 tablet (400 mg total) by mouth 2 (two) times daily with a meal. 180 tablet 0  . DULoxetine (CYMBALTA) 60 MG capsule Take 60 mg by mouth daily.    Marland Kitchen esomeprazole (NEXIUM) 40 MG capsule Take 40 mg by mouth daily.     Marland Kitchen FOLIC ACID PO Take 1 tablet by mouth daily.    Marland Kitchen GLUCOSAMINE PO Take 1 tablet by mouth 2 (two) times daily.     Marland Kitchen HYDROmorphone (DILAUDID) 2 MG tablet Take 2 mg by mouth 2 (two) times daily as needed (pain.).    Marland Kitchen JARDIANCE 25 MG TABS tablet Take 25 mg by mouth daily.    Marland Kitchen levothyroxine (SYNTHROID) 175 MCG tablet Take 175 mcg by mouth daily.    Marland Kitchen losartan (COZAAR) 50 MG tablet Take 50 mg by mouth daily.    Marland Kitchen LYSINE PO Take 1 tablet by mouth daily.    . Meclizine HCl (BONINE PO) Take 1 tablet by mouth daily as needed (nausea).     . metFORMIN (GLUCOPHAGE-XR) 500 MG 24 hr tablet Take 1,000 mg by mouth 2 (two) times daily.    . methocarbamol (ROBAXIN) 500 MG tablet Take 500 mg by mouth every 8 (eight) hours as needed for muscle spasms.    . Multiple Vitamins-Minerals (MENS 50+ MULTI VITAMIN/MIN PO) Take 1 tablet by mouth daily.    . Multiple Vitamins-Minerals (PRESERVISION AREDS 2) CAPS Take 1 capsule by mouth 2 (two) times daily.    . mupirocin ointment (BACTROBAN) 2 % SMARTSIG:1 Application Topical 2-3 Times Daily    . ondansetron (ZOFRAN) 4 MG tablet Take 4 mg by mouth 2 (two) times daily as needed for nausea.     Glory Rosebush VERIO test strip 1 each daily.    Marland Kitchen OZEMPIC, 0.25 OR 0.5 MG/DOSE, 2 MG/1.5ML SOPN Inject 0.5 mg into the skin every Sunday.     . polycarbophil (FIBERCON) 625 MG tablet Take 1,250 mg by mouth daily.    . potassium chloride SA (KLOR-CON) 20 MEQ tablet Take 40 mEq by mouth 2 (two) times daily.    . rosuvastatin (CRESTOR) 10 MG tablet Take 1 tablet (10 mg total) by mouth daily. 90 tablet 3  . tamsulosin (FLOMAX) 0.4 MG CAPS capsule Take 0.4 mg by mouth daily as needed (kidney stones).     Alveda Reasons 20 MG TABS tablet TAKE 1 TABLET DAILY WITH   SUPPER 90 tablet 3   Current Facility-Administered Medications  Medication Dose Route Frequency Provider Last Rate Last Admin  . 0.9 %  sodium chloride infusion  500 mL Intravenous Once Pyrtle, Lajuan Lines, MD        Allergies  Allergen Reactions  . Bystolic [Nebivolol Hcl] Swelling    Bradycardia   . Calcium Channel Blockers Swelling    LE edema  . Phenergan [Promethazine Hcl] Itching    Hallucination   . Beta Adrenergic Blockers Other (See Comments)  . Cardizem [Diltiazem] Other (See Comments)    Foot swelling and increased his heart rate  . Norvasc [Amlodipine] Other (See Comments)  . Promethazine Itching and Other (See Comments)  . Aspirin Other (See Comments)    Hx of ulcer   . Nsaids     Hx ulcer  . Prednisone Other (See Comments)    Hx of  ulcer    Social History   Socioeconomic History  . Marital status: Married    Spouse name: Not on file  . Number of children: Not on file  . Years of education: Not on file  . Highest education level: Not on file  Occupational History  . Not on file  Tobacco Use  . Smoking status: Never Smoker  . Smokeless tobacco: Never Used  Vaping Use  . Vaping Use: Never used  Substance and Sexual Activity  . Alcohol use: No  . Drug use: No  . Sexual activity: Yes  Other Topics Concern  . Not on file  Social History Narrative   Lives in [place].  Works as a Librarian, academic at [employer]: Not on [employer] Insecurity: Not on file  Transportation Needs: Not on file  Physical Activity: Not on file  Stress: Not on file  Social Connections: Not on file  Intimate Partner Violence: Not on file    Family History  Problem Relation Age of Onset  . Emphysema Mother   . Lung cancer Father   . Diabetes Brother   . Atrial fibrillation Brother   . Throat cancer Sister   . Lung cancer Sister   . Colon cancer Neg Hx     ROS- All systems are reviewed and negative except as per the HPI above  Physical Exam: Vitals:   05/26/20 1504  BP: 126/84  Pulse: 88  Weight: 94.3 kg  Height: 5\' 8"  (1.727 m)   Wt Readings from Last 3 Encounters:  05/26/20 94.3 kg  04/01/20 94.2 kg  03/25/20 94.3 kg    Labs: Lab Results  Component Value Date   NA 138 03/23/2020   K 4.1 03/23/2020   CL 106 03/23/2020   CO2 22 03/23/2020   GLUCOSE 121 (H) 03/23/2020   BUN 17 03/23/2020   CREATININE 1.36 (H) 03/23/2020   CALCIUM 9.0 03/23/2020   MG 1.9 03/05/2013   Lab Results  Component Value Date   INR 1.88 (H) 10/31/2015   Lab Results  Component Value Date   CHOL 181 07/24/2013   HDL 52 07/24/2013   LDLCALC 109 (H) 07/24/2013   TRIG 99 07/24/2013     GEN- The patient is well appearing, alert and oriented x 3 today.   Head-  normocephalic, atraumatic Eyes-  Sclera clear, conjunctiva pink Ears- hearing intact Oropharynx- clear Neck- supple, no JVP Lymph- no cervical lymphadenopathy Lungs- Clear to ausculation bilaterally, normal work of breathing Heart- irregular rate and rhythm, no murmurs, rubs or gallops, PMI not laterally displaced GI- soft, NT, ND, + BS Extremities- no clubbing, cyanosis, or edema, cast present on rt lower arm  MS- no significant deformity or atrophy Skin- no rash or lesion Psych- euthymic mood, full affect Neuro- strength and sensation are intact  EKG-  atrial flutter with variable AV block at 88 bpm.     Assessment and Plan: 1. Atrial  flutter  Complex afib history with 2 ablations and an convergent procedure in 2016 Last cardioversion was in mid December I will set up for DCCV  with UTI possibly being a trigger But if ERAF, he may have to consider tikosyn use  Continue Multaq 400 mg bid  Continue carvedilol 25 mg bid   Cbc/bmet/covid test scheduled   2. CHA2DS2VASc score of 6 Continue  xarelto 20 mg daily States no missed doses x 3 weeks   F/u with Dr.  Oval Linsey 3/3 as scheduled   Geroge Baseman. Breelle Hollywood, [facility] [address] [place], [place] [address] [phone]

## 2020-05-26 NOTE — Telephone Encounter (Signed)
Spoke with patient's wife - call transferred from operator  Patient has been in AFib since 1:30am by her report He has having shortness of breath, dizziness His HR is in the 140s I advised ED eval for symptomatic AFib w/RVR by her vital sign readings She is requesting an appointment  Contacted Stacy RN with AFib clinic per request She has also been in contact with patient as wife called to their office

## 2020-05-26 NOTE — Telephone Encounter (Signed)
Error

## 2020-05-28 ENCOUNTER — Telehealth (HOSPITAL_COMMUNITY): Payer: Self-pay | Admitting: *Deleted

## 2020-05-28 NOTE — Telephone Encounter (Signed)
Patient calling BP 140/95 HR running 140-150s. Per Adline Peals PA will increase coreg to 1.5 Tablet BID and call tomorrow with update of symptoms. Pt wife in agreement.

## 2020-05-28 NOTE — Telephone Encounter (Signed)
Patient calling. He had not received the message. He states his BP was 140/92. He requests a call back at [phone] or [phone]

## 2020-05-29 NOTE — Telephone Encounter (Signed)
Spoke with wife. See separate phone note.

## 2020-05-30 ENCOUNTER — Other Ambulatory Visit (HOSPITAL_COMMUNITY)
Admission: RE | Admit: 2020-05-30 | Discharge: 2020-05-30 | Disposition: A | Discharge status: Home / Self Care | Payer: No Typology Code available for payment source | Source: Ambulatory Visit | Attending: Internal Medicine | Admitting: Internal Medicine

## 2020-05-30 DIAGNOSIS — Z01812 Encounter for preprocedural laboratory examination: Secondary | ICD-10-CM | POA: Diagnosis not present

## 2020-05-30 DIAGNOSIS — Z20822 Contact with and (suspected) exposure to covid-19: Secondary | ICD-10-CM | POA: Insufficient documentation

## 2020-05-30 LAB — SARS CORONAVIRUS 2 (TAT 6-24 HRS): SARS Coronavirus 2: NEGATIVE

## 2020-06-02 ENCOUNTER — Other Ambulatory Visit: Payer: Self-pay

## 2020-06-02 ENCOUNTER — Encounter (HOSPITAL_COMMUNITY): Payer: Self-pay | Admitting: Internal Medicine

## 2020-06-02 ENCOUNTER — Ambulatory Visit (HOSPITAL_COMMUNITY): Payer: Medicare Other | Admitting: Certified Registered Nurse Anesthetist

## 2020-06-02 ENCOUNTER — Ambulatory Visit (HOSPITAL_COMMUNITY)
Admission: RE | Admit: 2020-06-02 | Discharge: 2020-06-02 | Disposition: A | Discharge status: Home / Self Care | Payer: Medicare Other | Attending: Internal Medicine | Admitting: Internal Medicine

## 2020-06-02 ENCOUNTER — Encounter (HOSPITAL_COMMUNITY)
Admission: RE | Disposition: A | Discharge status: Home / Self Care | Payer: Self-pay | Source: Home / Self Care | Attending: Internal Medicine

## 2020-06-02 DIAGNOSIS — Z7901 Long term (current) use of anticoagulants: Secondary | ICD-10-CM | POA: Diagnosis not present

## 2020-06-02 DIAGNOSIS — Z7984 Long term (current) use of oral hypoglycemic drugs: Secondary | ICD-10-CM | POA: Diagnosis not present

## 2020-06-02 DIAGNOSIS — I4892 Unspecified atrial flutter: Secondary | ICD-10-CM | POA: Diagnosis not present

## 2020-06-02 DIAGNOSIS — Z79899 Other long term (current) drug therapy: Secondary | ICD-10-CM | POA: Insufficient documentation

## 2020-06-02 DIAGNOSIS — I5043 Acute on chronic combined systolic (congestive) and diastolic (congestive) heart failure: Secondary | ICD-10-CM | POA: Diagnosis not present

## 2020-06-02 DIAGNOSIS — Z79890 Hormone replacement therapy (postmenopausal): Secondary | ICD-10-CM | POA: Insufficient documentation

## 2020-06-02 DIAGNOSIS — I130 Hypertensive heart and chronic kidney disease with heart failure and stage 1 through stage 4 chronic kidney disease, or unspecified chronic kidney disease: Secondary | ICD-10-CM | POA: Diagnosis not present

## 2020-06-02 DIAGNOSIS — Z886 Allergy status to analgesic agent status: Secondary | ICD-10-CM | POA: Insufficient documentation

## 2020-06-02 DIAGNOSIS — N183 Chronic kidney disease, stage 3 unspecified: Secondary | ICD-10-CM | POA: Diagnosis not present

## 2020-06-02 DIAGNOSIS — I480 Paroxysmal atrial fibrillation: Secondary | ICD-10-CM | POA: Diagnosis not present

## 2020-06-02 DIAGNOSIS — Z888 Allergy status to other drugs, medicaments and biological substances status: Secondary | ICD-10-CM | POA: Diagnosis not present

## 2020-06-02 HISTORY — PX: CARDIOVERSION: SHX1299

## 2020-06-02 SURGERY — CARDIOVERSION
Anesthesia: General

## 2020-06-02 MED ORDER — PROPOFOL 10 MG/ML IV BOLUS
INTRAVENOUS | Status: DC | PRN
Start: 2020-06-02 — End: 2020-06-02
  Administered 2020-06-02: 60 mg via INTRAVENOUS

## 2020-06-02 MED ORDER — LIDOCAINE 2% (20 MG/ML) 5 ML SYRINGE
INTRAMUSCULAR | Status: DC | PRN
  Administered 2020-06-02: 80 mg via INTRAVENOUS

## 2020-06-02 MED ORDER — SODIUM CHLORIDE 0.9 % IV SOLN
INTRAVENOUS | Status: DC
  Administered 2020-06-02: 500 mL via INTRAVENOUS

## 2020-06-02 NOTE — Discharge Instructions (Signed)
Electrical Cardioversion Electrical cardioversion is the delivery of a jolt of electricity to restore a normal rhythm to the heart. A rhythm that is too fast or is not regular keeps the heart from pumping well. In this procedure, sticky patches or metal paddles are placed on the chest to deliver electricity to the heart from a device. This procedure may be done in an emergency if:  There is low or no blood pressure as a result of the heart rhythm.  Normal rhythm must be restored as fast as possible to protect the brain and heart from further damage.  It may save a life. This may also be a scheduled procedure for irregular or fast heart rhythms that are not immediately life-threatening. Tell a health care provider about:  Any allergies you have.  All medicines you are taking, including vitamins, herbs, eye drops, creams, and over-the-counter medicines.  Any problems you or family members have had with anesthetic medicines.  Any blood disorders you have.  Any surgeries you have had.  Any medical conditions you have.  Whether you are pregnant or may be pregnant. What are the risks? Generally, this is a safe procedure. However, problems may occur, including:  Allergic reactions to medicines.  A blood clot that breaks free and travels to other parts of your body.  The possible return of an abnormal heart rhythm within hours or days after the procedure.  Your heart stopping (cardiac arrest). This is rare. What happens before the procedure? Medicines  Your health care provider may have you start taking: ? Blood-thinning medicines (anticoagulants) so your blood does not clot as easily. ? Medicines to help stabilize your heart rate and rhythm.  Ask your health care provider about: ? Changing or stopping your regular medicines. This is especially important if you are taking diabetes medicines or blood thinners. ? Taking medicines such as aspirin and ibuprofen. These medicines can  thin your blood. Do not take these medicines unless your health care provider tells you to take them. ? Taking over-the-counter medicines, vitamins, herbs, and supplements. General instructions  Follow instructions from your health care provider about eating or drinking restrictions.  Plan to have someone take you home from the hospital or clinic.  If you will be going home right after the procedure, plan to have someone with you for 24 hours.  Ask your health care provider what steps will be taken to help prevent infection. These may include washing your skin with a germ-killing soap. What happens during the procedure?  An IV will be inserted into one of your veins.  Sticky patches (electrodes) or metal paddles may be placed on your chest.  You will be given a medicine to help you relax (sedative).  An electrical shock will be delivered. The procedure may vary among health care providers and hospitals.   What can I expect after the procedure?  Your blood pressure, heart rate, breathing rate, and blood oxygen level will be monitored until you leave the hospital or clinic.  Your heart rhythm will be watched to make sure it does not change.  You may have some redness on the skin where the shocks were given. Follow these instructions at home:  Do not drive for 24 hours if you were given a sedative during your procedure.  Take over-the-counter and prescription medicines only as told by your health care provider.  Ask your health care provider how to check your pulse. Check it often.  Rest for 48 hours after the procedure   or as told by your health care provider.  Avoid or limit your caffeine use as told by your health care provider.  Keep all follow-up visits as told by your health care provider. This is important. Contact a health care provider if:  You feel like your heart is beating too quickly or your pulse is not regular.  You have a serious muscle cramp that does not go  away. Get help right away if:  You have discomfort in your chest.  You are dizzy or you feel faint.  You have trouble breathing or you are short of breath.  Your speech is slurred.  You have trouble moving an arm or leg on one side of your body.  Your fingers or toes turn cold or blue. Summary  Electrical cardioversion is the delivery of a jolt of electricity to restore a normal rhythm to the heart.  This procedure may be done right away in an emergency or may be a scheduled procedure if the condition is not an emergency.  Generally, this is a safe procedure.  After the procedure, check your pulse often as told by your health care provider. This information is not intended to replace advice given to you by your health care provider. Make sure you discuss any questions you have with your health care provider. Document Revised: 10/29/2018 Document Reviewed: 10/29/2018 Elsevier Patient Education  2021 Elsevier Inc.  

## 2020-06-02 NOTE — Anesthesia Postprocedure Evaluation (Signed)
Anesthesia Post Note  Patient: [name] [name]  Procedure(s) Performed: CARDIOVERSION (N/A )     Patient location during evaluation: PACU Anesthesia Type: General Level of consciousness: awake and alert Pain management: pain level controlled Vital Signs Assessment: post-procedure vital signs reviewed and stable Respiratory status: spontaneous breathing, nonlabored ventilation, respiratory function stable and patient connected to nasal cannula oxygen Cardiovascular status: blood pressure returned to baseline and stable Postop Assessment: no apparent nausea or vomiting Anesthetic complications: no   No complications documented.  Last Vitals:  Vitals:   06/02/20 1120 06/02/20 1122  BP: 132/83 132/83  Pulse: 68 73  Resp: 14 11  Temp:    SpO2: 98% 99%    Last Pain:  Vitals:   06/02/20 1122  TempSrc:   PainSc: 0-No pain                 Effie Berkshire

## 2020-06-02 NOTE — CV Procedure (Signed)
   CARDIOVERSION NOTE  Procedure: Electrical Cardioversion Indications:  Atrial Flutter  Procedure Details:  Consent: Risks of procedure as well as the alternatives and risks of each were explained to the (patient/caregiver).  Consent for procedure obtained.  Time Out: Verified patient identification, verified procedure, site/side was marked, verified correct patient position, special equipment/implants available, medications/allergies/relevent history reviewed, required imaging and test results available.  Performed  Patient placed on cardiac monitor, pulse oximetry, supplemental oxygen as necessary.  Sedation given: propofol per anesthesia Pacer pads placed anterior and posterior chest.  Cardioverted 1 time(s).  Cardioverted at 120J biphasic.  Impression: Findings: Post procedure EKG shows: NSR Complications: None Patient did tolerate procedure well.  Plan: 1. Successful DCCV with a single 120J biphasic shock.  Time Spent Directly with the Patient:  30 minutes   Pixie Casino, MD, [facility], [place] Director of the Advanced Lipid Disorders &  Cardiovascular Risk Reduction Clinic Diplomate of the American Board of Clinical Lipidology Attending Cardiologist  Direct Dial: [phone]  Fax: [phone]  Website:  [email or web address] 06/02/2020, 10:57 AM

## 2020-06-02 NOTE — Transfer of Care (Signed)
Immediate Anesthesia Transfer of Care Note  Patient: [name] [name]  Procedure(s) Performed: CARDIOVERSION (N/A )  Patient Location: Endoscopy Unit  Anesthesia Type:General  Level of Consciousness: drowsy and patient cooperative  Airway & Oxygen Therapy: Patient Spontanous Breathing and Patient connected to nasal cannula oxygen  Post-op Assessment: Report given to RN and Post -op Vital signs reviewed and stable  Post vital signs: Reviewed  Last Vitals:  Vitals Value Taken Time  BP 133/80 06/02/20 1055  Temp    Pulse 71 06/02/20 1055  Resp 9 06/02/20 1055  SpO2 100 % 06/02/20 1055    Last Pain:  Vitals:   06/02/20 0939  TempSrc: Oral  PainSc: 0-No pain         Complications: No complications documented.

## 2020-06-02 NOTE — Anesthesia Preprocedure Evaluation (Addendum)
Anesthesia Evaluation  Patient identified by MRN, date of birth, ID band Patient awake    Reviewed: Allergy & Precautions, NPO status , Patient's Chart, lab work & pertinent test results  Airway Mallampati: III  TM Distance: >3 FB Neck ROM: Full    Dental no notable dental hx.    Pulmonary sleep apnea ,    Pulmonary exam normal breath sounds clear to auscultation       Cardiovascular hypertension, Pt. on home beta blockers +CHF  + dysrhythmias Atrial Fibrillation  Rhythm:Irregular Rate:Tachycardia     Neuro/Psych  Headaches, TIA Neuromuscular disease negative psych ROS   GI/Hepatic PUD, GERD  Medicated,  Endo/Other  diabetes, Type 2, Oral Hypoglycemic AgentsHypothyroidism   Renal/GU      Musculoskeletal   Abdominal   Peds  Hematology   Anesthesia Other Findings   Reproductive/Obstetrics                            Anesthesia Physical Anesthesia Plan  ASA: III  Anesthesia Plan: General   Post-op Pain Management:    Induction:   PONV Risk Score and Plan: 0 and Propofol infusion  Airway Management Planned: Natural Airway and Simple Face Mask  Additional Equipment: None  Intra-op Plan:   Post-operative Plan:   Informed Consent:   Plan Discussed with: CRNA  Anesthesia Plan Comments: (Echo:  1. Left ventricular ejection fraction, by estimation, is 60 to 65%. The  left ventricle has normal function. The left ventricle has no regional  wall motion abnormalities.  2. Right ventricular systolic function is normal. The right ventricular  size is normal.  3. No left atrial/left atrial appendage thrombus was detected. The LAA  emptying velocity was 86 cm/s.  4. The mitral valve is normal in structure. Mild mitral valve  regurgitation. No evidence of mitral stenosis.  5. The aortic valve is tricuspid. Aortic valve regurgitation is not  visualized. No aortic stenosis is  present.  6. Aortic dilatation noted. There is mild dilatation of the ascending  aorta, measuring 38 mm.  7. The inferior vena cava is normal in size with greater than 50%  respiratory variability, suggesting right atrial pressure of 3 mmHg. )        Anesthesia Quick Evaluation

## 2020-06-02 NOTE — Interval H&P Note (Signed)
History and Physical Interval Note:  06/02/2020 10:32 AM  [name] [name]  has presented today for surgery, with the diagnosis of AFIB.  The various methods of treatment have been discussed with the patient and family. After consideration of risks, benefits and other options for treatment, the patient has consented to  Procedure(s): CARDIOVERSION (N/A) as a surgical intervention.  The patient's history has been reviewed, patient examined, no change in status, stable for surgery.  I have reviewed the patient's chart and labs.  Questions were answered to the patient's satisfaction.     Pixie Casino

## 2020-06-03 ENCOUNTER — Encounter (HOSPITAL_COMMUNITY): Payer: Self-pay | Admitting: Internal Medicine

## 2020-06-03 DIAGNOSIS — M13831 Other specified arthritis, right wrist: Secondary | ICD-10-CM | POA: Diagnosis not present

## 2020-06-03 DIAGNOSIS — M1811 Unilateral primary osteoarthritis of first carpometacarpal joint, right hand: Secondary | ICD-10-CM | POA: Diagnosis not present

## 2020-06-03 DIAGNOSIS — G5601 Carpal tunnel syndrome, right upper limb: Secondary | ICD-10-CM | POA: Diagnosis not present

## 2020-06-11 ENCOUNTER — Other Ambulatory Visit: Payer: Self-pay

## 2020-06-11 ENCOUNTER — Encounter: Payer: Self-pay | Admitting: Cardiovascular Disease

## 2020-06-11 ENCOUNTER — Ambulatory Visit (INDEPENDENT_AMBULATORY_CARE_PROVIDER_SITE_OTHER): Payer: Medicare Other | Admitting: Cardiovascular Disease

## 2020-06-11 VITALS — BP 112/70 | HR 64 | Ht 67.0 in | Wt 207.8 lb

## 2020-06-11 DIAGNOSIS — I5043 Acute on chronic combined systolic (congestive) and diastolic (congestive) heart failure: Secondary | ICD-10-CM | POA: Diagnosis not present

## 2020-06-11 DIAGNOSIS — I5042 Chronic combined systolic (congestive) and diastolic (congestive) heart failure: Secondary | ICD-10-CM | POA: Diagnosis not present

## 2020-06-11 DIAGNOSIS — N1831 Chronic kidney disease, stage 3a: Secondary | ICD-10-CM | POA: Diagnosis not present

## 2020-06-11 DIAGNOSIS — I480 Paroxysmal atrial fibrillation: Secondary | ICD-10-CM

## 2020-06-11 DIAGNOSIS — I4821 Permanent atrial fibrillation: Secondary | ICD-10-CM

## 2020-06-11 MED ORDER — CARVEDILOL 12.5 MG PO TABS: ORAL_TABLET | ORAL | 3 refills | Status: DC

## 2020-06-11 NOTE — Progress Notes (Signed)
Cardiology Office Note  Date:  07/03/2020   ID:  [name] [name] 03-07-1951, MRN [id number]  PCP:  [name] Battles, MD  Cardiologist:   [name] Latch, MD   No chief complaint on file.    History of Present Illness: [name] [name] is a 70 y.o. male with atrial fibrillation s/p ablation x2 [facility]), chronic diastolic heart failure, hypertension, hyperlipidemia, DM, and prior prostate cancerwho presents for follow up.  He had ablations at [facility] most recently in 2016.  That was reportedly complicated by cardiogenic shock requiring 4 days in ICU and intubation.  He last saw Dr. Irish [name] 03/2020 and had recurrent atrial flutter after a recent surgery.  He underwent TEE/DCCV on 03/2020.  He was started back on dronedarone, which he had previously tolerated.  Since then he followed up in the atrial fibrillation clinic.  He had rate controlled atrial fibrillation when last seen 05/26/20.  This was thoguht to be triggered by a UTI and he was successfully cardiovereted 2/22.  Since then he has been doing well and maintaining sinus rhythm.  He has lost 20 lb and no longer snores.    Past Medical History:  Diagnosis Date  . Arthritis    OA  . Atrial flutter ([place])    a. s/p RFCA 8/13  . Cancer [facility]) 2010   prostate  . Chronic combined systolic and diastolic CHF (congestive heart failure) ([place])    a. LVEF previously 35% felt to be due tachycardia;  b. 02/2013 Echo: EF 50-55%, no rwma, mildly dil LA/RA, mild to mod MR. C 11/2014 echo - ef 60-65%, unable to determine DD  . CKD (chronic kidney disease), stage III (HCC)    hx of a/c renal failure during episode of diverticulitis 9/13 in [place], [place]  . Diabetes mellitus ([place])    TYPE 2   . Diverticulitis   . Diverticulosis   . Dyslipidemia   . Dysrhythmia   . Erectile dysfunction   . Gastric ulcer    s/p prior surgery  . GERD (gastroesophageal reflux disease)   . Headache   . History of colonic diverticulitis   . History of prostate  cancer   . Hypertension   . Hypothyroid   . Microcytic anemia   . Obesities, morbid ([place])   . Obstructive sleep apnea    noncompliant with CPAP  . Osteoporosis   . PAF (paroxysmal atrial fibrillation) ([place])    a. failed DCCV and multiple anti-arrhythmic drugs (multaq/flecainide);   b. s/p  PVI isolation with ablation of AFib 8/13; c. repeat PVI 01/2013;  d. 02/2013 repeat DCCV->Amio load/xarelto.  . Peptic ulcer disease 1994  . Renal calculi   . Sleep apnea 2015   has not had sleep apnea since heart intervention  . Thrombocytopenia ([place]) 1992   idopathic-treated with danazol  . Tubular adenoma of colon     Past Surgical History:  Procedure Laterality Date  . ATRIAL FIBRILLATION ABLATION  12/06/11; 02/05/2013   Afib and atrial flutter ablation by Dr [name] [name]; repeat PVI by Dr [name] [name] 02/05/2013  . ATRIAL FIBRILLATION ABLATION N/A 12/06/2011   Procedure: ATRIAL FIBRILLATION ABLATION;  Surgeon: [name] Grayer, MD;  Location: [facility] CATH LAB;  Service: Cardiovascular;  Laterality: N/A;  . ATRIAL FIBRILLATION ABLATION N/A 02/05/2013   Procedure: ATRIAL FIBRILLATION ABLATION;  Surgeon: [name] Mark, MD;  Location: [place] CATH LAB;  Service: Cardiovascular;  Laterality: N/A;  . benign stomach tumor removal  2000  . CARDIOVERSION  08/25/2011   Procedure: CARDIOVERSION;  Surgeon: [name] Booze, MD;  Location: [place];  Service: Cardiovascular;  Laterality: N/A;  . CARDIOVERSION N/A 03/05/2013   Procedure: CARDIOVERSION;  Surgeon: [name] Grooms, MD;  Location: [place];  Service: Cardiovascular;  Laterality: N/A;  BEDSIDE   . CARDIOVERSION N/A 03/25/2020   Procedure: CARDIOVERSION;  Surgeon: [name] Latch, MD;  Location: [place];  Service: Cardiovascular;  Laterality: N/A;  . CARDIOVERSION N/A 06/02/2020   Procedure: CARDIOVERSION;  Surgeon: [name] Casino, MD;  Location: [facility] ENDOSCOPY;  Service: Cardiovascular;  Laterality: N/A;  . CARPAL TUNNEL WITH CUBITAL TUNNEL Right  02/25/2020   Procedure: RIGHT CARPAL TUNNEL RELEASE, CUBITAL TUNNEL RELEASE IN SITU, RIGHT WRIST PROXIMAL ROW CARPECTOMY AND PROXIMAL CAPITATE HEAD REPLACEMENT/HEMIARTHROPLASTY WITH POSTERIOR INTEROSSOUS NERVE NEURECTOMY AND REPAIR;  Surgeon: [name] Kaufman, MD;  Location: [place];  Service: Orthopedics;  Laterality: Right;  2.5 hrs Block with IV Sedation  . convergent afib abaltion [facility] 02/26/15  02/26/15   [facility] by Dr. Lehman [name] and Dr. Danise [name]  . ELECTROPHYSIOLOGIC STUDY N/A 11/24/2014   Procedure: Cardioversion;  Surgeon: [name] Meredith Leeds, MD;  Location: [place] CV LAB;  Service: Cardiovascular;  Laterality: N/A;  . ESOPHAGOGASTRODUODENOSCOPY N/A 04/09/2013   Procedure: ESOPHAGOGASTRODUODENOSCOPY (EGD) with possible Balloon Dilation;  Surgeon: [name] Fair, MD;  Location: [facility] ENDOSCOPY;  Service: Endoscopy;  Laterality: N/A;  . kidney stone removal     multiple  . KNEE ARTHROSCOPY Bilateral 1979  . LAPAROSCOPIC RETROPUBIC PROSTATECTOMY  02/2007   hx prostate cancer  . ORIF ANKLE FRACTURE Right 10/2015  . ORIF ANKLE FRACTURE Right 11/03/2015   Procedure: ORIF RIGHT LISFRANK FOOT;  Surgeon: [name] Minion, MD;  Location: [place];  Service: Orthopedics;  Laterality: Right;  . TEE WITHOUT CARDIOVERSION  08/25/2011   Procedure: TRANSESOPHAGEAL ECHOCARDIOGRAM (TEE);  Surgeon: [name] Booze, MD;  Location: [place];  Service: Cardiovascular;  Laterality: N/A;  . TEE WITHOUT CARDIOVERSION  11/09/2011   Procedure: TRANSESOPHAGEAL ECHOCARDIOGRAM (TEE);  Surgeon: [name] Booze, MD;  Location: [facility] ENDOSCOPY;  Service: Cardiovascular;  Laterality: N/A;  h/p in file drawer  . TEE WITHOUT CARDIOVERSION N/A 02/05/2013   Procedure: TRANSESOPHAGEAL ECHOCARDIOGRAM (TEE);  Surgeon: [name] Furbish, MD;  Location: [facility] ENDOSCOPY;  Service: Cardiovascular;  Laterality: N/A;  . TEE WITHOUT CARDIOVERSION N/A 03/25/2020   Procedure: TRANSESOPHAGEAL ECHOCARDIOGRAM (TEE);  Surgeon: [name] Latch, MD;   Location: [place];  Service: Cardiovascular;  Laterality: N/A;  . TOTAL THYROIDECTOMY  2009   benign nodules removed  . WRIST SURGERY Right 1977   with placement of lunate prosthesis     Current Outpatient Medications  Medication Sig Dispense Refill  . acetaminophen (TYLENOL) 500 MG tablet Take 1,000 mg by mouth every 6 (six) hours as needed for moderate pain or headache.    . ALPRAZolam (XANAX) 1 MG tablet Take 1 tablet (1 mg total) by mouth 3 (three) times daily as needed for anxiety. 30 tablet 0  . bismuth subsalicylate (PEPTO BISMOL) 262 MG/15ML suspension Take 30 mLs by mouth every 6 (six) hours as needed for indigestion or diarrhea or loose stools.    . dronedarone (MULTAQ) 400 MG tablet Take 1 tablet (400 mg total) by mouth 2 (two) times daily with a meal. 180 tablet 0  . DULoxetine (CYMBALTA) 60 MG capsule Take 60 mg by mouth daily.    Marland Kitchen esomeprazole (NEXIUM) 40 MG capsule Take 40 mg by mouth daily.     Marland Kitchen FOLIC ACID PO Take 7,408 mcg by mouth daily.    Marland Kitchen  Glucosamine HCl 1500 MG TABS Take 1,500 mg by mouth in the morning and at bedtime.    Marland Kitchen HYDROmorphone (DILAUDID) 2 MG tablet Take 2 mg by mouth 2 (two) times daily as needed (pain.).    Marland Kitchen JARDIANCE 25 MG TABS tablet Take 25 mg by mouth daily.    Marland Kitchen levothyroxine (SYNTHROID) 175 MCG tablet Take 175 mcg by mouth daily.    Marland Kitchen losartan (COZAAR) 50 MG tablet Take 50 mg by mouth every evening.    Marland Kitchen LYSINE PO Take 1 tablet by mouth daily as needed (cold sores).    . Meclizine HCl (BONINE PO) Take 1 tablet by mouth daily as needed (nausea).    . metFORMIN (GLUCOPHAGE-XR) 500 MG 24 hr tablet Take 1,000 mg by mouth 2 (two) times daily.    . methocarbamol (ROBAXIN) 500 MG tablet Take 500 mg by mouth every 8 (eight) hours as needed for muscle spasms.    . Multiple Vitamin (MULTIVITAMIN WITH MINERALS) TABS tablet Take 1 tablet by mouth daily. One-A-Day for Men    . Multiple Vitamins-Minerals (PRESERVISION AREDS 2) CAPS Take 1 capsule by  mouth 2 (two) times daily.    . mupirocin ointment (BACTROBAN) 2 % Apply 1 application topically 2 (two) times daily as needed (skin wounds).    . ondansetron (ZOFRAN) 4 MG tablet Take 4 mg by mouth 2 (two) times daily as needed for nausea.     Glory Rosebush VERIO test strip 1 each daily.    Marland Kitchen OZEMPIC, 0.25 OR 0.5 MG/DOSE, 2 MG/1.5ML SOPN Inject 0.5 mg into the skin every Thursday.    . polycarbophil (FIBERCON) 625 MG tablet Take 1,250 mg by mouth every evening.    . potassium chloride SA (KLOR-CON) 20 MEQ tablet Take 40 mEq by mouth 2 (two) times daily.    . Probiotic Product (PROBIOTIC PO) Take 1 capsule by mouth daily.    . rosuvastatin (CRESTOR) 10 MG tablet Take 1 tablet (10 mg total) by mouth daily. (Patient taking differently: Take 10 mg by mouth every evening.) 90 tablet 3  . tamsulosin (FLOMAX) 0.4 MG CAPS capsule Take 0.4 mg by mouth daily as needed (kidney stones).     . vitamin B-12 (CYANOCOBALAMIN) 1000 MCG tablet Take 1,000 mcg by mouth daily.    Alveda Reasons 20 MG TABS tablet TAKE 1 TABLET DAILY WITH   SUPPER (Patient taking differently: Take 20 mg by mouth daily with supper.) 90 tablet 3  . carvedilol (COREG) 12.5 MG tablet TAKE 3 TABLET BY MOUTH TO EQUAL 37.5 MG TWICE A DAY 270 tablet 3   Current Facility-Administered Medications  Medication Dose Route Frequency Provider Last Rate Last Admin  . 0.9 %  sodium chloride infusion  500 mL Intravenous Once Pyrtle, Lajuan Lines, MD        Allergies:   Thayer Jew hcl], Calcium channel blockers, Phenergan [promethazine hcl], Beta adrenergic blockers, Cardizem [diltiazem], Norvasc [amlodipine], Promethazine, Aspirin, Nsaids, and Prednisone    Social History:  The patient  reports that he has never smoked. He has never used smokeless tobacco. He reports that he does not drink alcohol and does not use drugs.   Family History:  The patient's family history includes Atrial fibrillation in his brother; Diabetes in his brother; Emphysema in his  mother; Lung cancer in his father and sister; Throat cancer in his sister.    ROS:  Please see the history of present illness.   Otherwise, review of systems are positive for none.  All other systems are reviewed and negative.    PHYSICAL EXAM: VS:  BP 112/70   Pulse 64   Ht 5\' 7"  (1.702 m)   Wt 207 lb 12.8 oz (94.3 kg)   SpO2 96%   BMI 32.55 kg/m  , BMI Body mass index is 32.55 kg/m. GENERAL:  Well appearing HEENT:  Pupils equal round and reactive, fundi not visualized, oral mucosa unremarkable NECK:  No jugular venous distention, waveform within normal limits, carotid upstroke brisk and symmetric, no bruits LUNGS:  Clear to auscultation bilaterally HEART:  RRR.  PMI not displaced or sustained,S1 and S2 within normal limits, no S3, no S4, no clicks, no rubs, no murmurs ABD:  Flat, positive bowel sounds normal in frequency in pitch, no bruits, no rebound, no guarding, no midline pulsatile mass, no hepatomegaly, no splenomegaly EXT:  2 plus pulses throughout, no edema, no cyanosis no clubbing SKIN:  No rashes no nodules NEURO:  Cranial nerves II through XII grossly intact, motor grossly intact throughout PSYCH:  Cognitively intact, oriented to person place and time   EKG:  EKG is not ordered today.   Recent Labs: 03/23/2020: ALT 19; TSH 2.260 05/26/2020: BUN 22; Creatinine, Ser 1.30; Hemoglobin 13.8; Platelets 225; Potassium 4.7; Sodium 142    Lipid Panel    Component Value Date/Time   CHOL 181 07/24/2013 0530   TRIG 99 07/24/2013 0530   HDL 52 07/24/2013 0530   CHOLHDL 3.5 07/24/2013 0530   VLDL 20 07/24/2013 0530   LDLCALC 109 (H) 07/24/2013 0530      Wt Readings from Last 3 Encounters:  06/11/20 207 lb 12.8 oz (94.3 kg)  06/02/20 199 lb (90.3 kg)  05/26/20 207 lb 12.8 oz (94.3 kg)      ASSESSMENT AND PLAN:  #Persistent atrial fibirllation:  # Atrial flutter: [name] [name] is maintaining sinus rhythm after cardioversion 05/2020.  Continue carvedilol,  dronedarone, and Xarelto.  # Hypertension:  Blood pressure well have been controlled on carvedilol and losartan.  # Hyperlipidemia:  Continue rosuvastatin.  Dr. Philip Aspen checks his lipids.  Current medicines are reviewed at length with the patient today.  The patient does not have concerns regarding medicines.  The following changes have been made:  no change  Labs/ tests ordered today include:  No orders of the defined types were placed in this encounter.    Disposition:   FU with [name] Mozer C. Oval Linsey, MD, [facility] in 6 months     Signed, [name] Adamczak C. Oval Linsey, MD, [facility]  07/03/2020 11:11 AM    [place]

## 2020-06-11 NOTE — Patient Instructions (Signed)
Medication Instructions:  Your physician recommends that you continue on your current medications as directed. Please refer to the Current Medication list given to you today.   *If you need a refill on your cardiac medications before your next appointment, please call your pharmacy*  Lab Work: NONE   Testing/Procedures: NONE  Follow-Up: At [facility], you and your health needs are our priority.  As part of our continuing mission to provide you with exceptional heart care, we have created designated Provider Care Teams.  These Care Teams include your primary Cardiologist (physician) and Advanced Practice Providers (APPs -  Physician Assistants and Nurse Practitioners) who all work together to provide you with the care you need, when you need it.  We recommend signing up for the patient portal called "MyChart".  Sign up information is provided on this After Visit Summary.  MyChart is used to connect with patients for Virtual Visits (Telemedicine).  Patients are able to view lab/test results, encounter notes, upcoming appointments, etc.  Non-urgent messages can be sent to your provider as well.   To learn more about what you can do with MyChart, go to [email or web address].    Your next appointment:   6 month(s)  The format for your next appointment:   In Person  Provider:   You may see DR [facility]  or one of the following Advanced Practice Providers on your designated Care Team:    Sande Rives, PA-C  Coletta Memos, FNP

## 2020-07-03 ENCOUNTER — Encounter: Payer: Self-pay | Admitting: Cardiovascular Disease

## 2020-07-08 DIAGNOSIS — Z23 Encounter for immunization: Secondary | ICD-10-CM | POA: Diagnosis not present

## 2020-07-10 ENCOUNTER — Telehealth: Payer: Self-pay | Admitting: *Deleted

## 2020-07-10 ENCOUNTER — Ambulatory Visit (INDEPENDENT_AMBULATORY_CARE_PROVIDER_SITE_OTHER): Payer: Medicare Other | Admitting: Internal Medicine

## 2020-07-10 ENCOUNTER — Encounter: Payer: Self-pay | Admitting: Internal Medicine

## 2020-07-10 VITALS — BP 120/86 | HR 64 | Ht 67.0 in | Wt 210.0 lb

## 2020-07-10 DIAGNOSIS — Z86010 Personal history of colonic polyps: Secondary | ICD-10-CM | POA: Diagnosis not present

## 2020-07-10 DIAGNOSIS — K5732 Diverticulitis of large intestine without perforation or abscess without bleeding: Secondary | ICD-10-CM

## 2020-07-10 DIAGNOSIS — Z8719 Personal history of other diseases of the digestive system: Secondary | ICD-10-CM | POA: Diagnosis not present

## 2020-07-10 DIAGNOSIS — Z7901 Long term (current) use of anticoagulants: Secondary | ICD-10-CM

## 2020-07-10 MED ORDER — SUPREP BOWEL PREP KIT 17.5-3.13-1.6 GM/177ML PO SOLN: 1.0000 | ORAL | 0 refills | Status: DC

## 2020-07-10 MED ORDER — SUTAB 1479-225-188 MG PO TABS: ORAL_TABLET | ORAL | 0 refills | Status: DC

## 2020-07-10 NOTE — Telephone Encounter (Signed)
Patient with diagnosis of afib on Xarelto for anticoagulation.    Procedure: colonoscopy Date of procedure: 08/25/20  CHA2DS2-VASc Score = 4  This indicates a 4.8% annual risk of stroke. The patient's score is based upon: CHF History: Yes HTN History: Yes Diabetes History: Yes Stroke History: No Vascular Disease History: No Age Score: 1 Gender Score: 0      CrCl 58 ml/min Platelet count 225  Patients problem list has TIA on it. Looks like he had a very questionable TIA in 2015. MD wants a 2 day hold. I will check with Dr. Oval Linsey to see if she is ok with this since he does have TIA listed in chart.

## 2020-07-10 NOTE — Telephone Encounter (Signed)
Request for surgical clearance:     Endoscopy Procedure  What type of surgery is being performed?     colonoscopy  When is this surgery scheduled?     08/25/20  What type of clearance is required ?   Pharmacy  Are there any medications that need to be held prior to surgery and how long? Xarelto, 2 days  Practice name and name of physician performing surgery?      [facility]  What is your office phone and fax number?      Phone- [phone]  [phone]  Anesthesia type (None, local, MAC, general) ?       MAC

## 2020-07-10 NOTE — Progress Notes (Signed)
Subjective:    Patient ID: [name] [name], male    DOB: Jul 05, 1950, 70 y.o.   MRN: [id number]  HPI [name] [name] is a 70 year old male with a past medical history of multiple adenomatous colon polyps, family history of colon cancer, diverticulosis with history of diverticulitis, GERD, atrial flutter status post ablation and recent cardioversion in February 2022 on Xarelto, diabetes, dyslipidemia, hypertension and hypothyroidism, sleep apnea who is here to discuss surveillance colonoscopy.  He is here today with his wife.  He reports that he has been doing well from a GI perspective.  He did call with left-sided abdominal pain in December 2020.  I performed a CT scan of the abdomen pelvis which showed diverticulitis near the junction of the descending and sigmoid colon.  He was treated with antibiotics and his symptoms have resolved completely.  No recent abdominal pain.  Bowel movements have been regular.  No blood in stool or melena.  No upper GI or hepatobiliary complaint.  He did have A. fib several months ago with RVR and went for a cardioversion which to this point has been successful.  A. fib recurred after having a right carpal tunnel surgery.  Last colonoscopy was performed on 03/15/2017.  This revealed 9 polyps ranging in size from 4 to 7 mm.  All 9 were found to be tubular adenomas without high-grade dysplasia.  There was diverticulosis from the descending to sigmoid colon.  Small internal hemorrhoids were seen.  Review of Systems As per HPI, otherwise negative  Current Medications, Allergies, Past Medical History, Past Surgical History, Family History and Social History were reviewed in [employer] record.      Objective:   Physical Exam BP 120/86 (BP Location: Right Arm, Patient Position: Sitting, Cuff Size: Normal)   Pulse 64   Ht 5\' 7"  (1.702 m)   Wt 210 lb (95.3 kg)   BMI 32.89 kg/m  Gen: awake, alert, NAD HEENT: anicteric CV: RRR, no mrg Pulm:  CTA b/l Abd: soft, NT/ND, +BS throughout Ext: no c/c/e Neuro: nonfocal  CT ABDOMEN AND PELVIS WITH CONTRAST   TECHNIQUE: Multidetector CT imaging of the abdomen and pelvis was performed using the standard protocol following bolus administration of intravenous contrast.   CONTRAST:  157mL OMNIPAQUE IOHEXOL 300 MG/ML  SOLN   COMPARISON:  10/31/2015 and 10/24/2016   FINDINGS: Lower chest: Unremarkable   Hepatobiliary: Unremarkable   Pancreas: Unremarkable   Spleen: Unremarkable   Adrenals/Urinary Tract: Both adrenal glands appear normal.   There are 4 nonobstructive right renal calculi, the largest measuring 0.4 cm in long axis in the lower pole on image 52/4.   A collection of over 12 small calculi is present in the left kidney lower pole, individually these calculi only measure up to about 4 mm in diameter but collectively the cluster measures 1.7 by 0.9 by 0.7 cm. There is also a 3 mm left kidney upper pole nonobstructive renal calculus.   Exophytic 1.1 by 1.1 by 1.1 cm left kidney upper pole lesion medially does not appear to change density between portal venous and delayed phase images, and is likely a complex cyst although is technically too small to characterize.   No hydronephrosis or hydroureter. No ureteral or bladder calculus.   Stomach/Bowel: Postoperative findings along the gastric body.   Descending and sigmoid colon diverticulosis with abnormal wall thickening and surrounding inflammatory stranding and a 6 cm loop at the junction of the descending and sigmoid colon favoring acute diverticulitis. This is  proximal to the previous location of diverticulitis shown on 10/24/2016. No extraluminal gas or abscess.   Vascular/Lymphatic: Aortoiliac atherosclerotic vascular disease.   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   Other: No supplemental non-categorized findings.   Musculoskeletal: Mild lumbar spondylosis without significant impingement.   There  is evidence of chronic bilateral hip avascular necrosis without collapse or flattening. Mild spurring of the acetabula.   IMPRESSION: 1. Acute diverticulitis at the junction of the descending and sigmoid colon. No extraluminal gas or abscess. 2. Bilateral nonobstructive nephrolithiasis. 3. Aortic atherosclerosis. 4. 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   Aortic Atherosclerosis (ICD10-I70.0).     Electronically Signed   By: Van Clines M.D.   On: 03/19/2019 16:54      Assessment & Plan:  70 year old male with a past medical history of multiple adenomatous colon polyps, family history of colon cancer, diverticulosis with history of diverticulitis, GERD, atrial flutter status post ablation and recent cardioversion in February 2022 on Xarelto, diabetes, dyslipidemia, hypertension and hypothyroidism, sleep apnea who is here to discuss surveillance colonoscopy. 1  1.  History of multiple adenomatous colon polyps --9 adenomas removed in December 2018.  Surveillance colonoscopy recommended at this time.  We discussed the risk, benefits and alternatives and he is agreeable and wishes to proceed.  We will need to hold Xarelto 48 hours prior to this procedure. --Colonoscopy in the [place]  Will hold Xarelto to days prior to endoscopic procedures - will instruct when and how to resume after procedure. Benefits and risks of procedure explained including risks of bleeding, perforation, infection, missed lesions, reactions to medications and possible need for hospitalization and surgery for complications. Additional rare but real risk of stroke or other vascular clotting events off Xarelto also explained and need to seek urgent help if any signs of these problems occur. Will communicate by phone or EMR with patient's  prescribing provider to confirm that holding Xarelto is reasonable in this case.   2.  History of diverticulitis --uncomplicated diverticular flare  in December 2020 documented by CT scan as above.  This has resolved.  No current signs or symptoms of diverticulitis.

## 2020-07-10 NOTE — Telephone Encounter (Signed)
OK to hold for procedure.

## 2020-07-10 NOTE — Patient Instructions (Signed)
You have been scheduled for a colonoscopy. Please follow written instructions given to you at your visit today.  Please pick up your prep supplies at the pharmacy within the next 1-3 days. If you use inhalers (even only as needed), please bring them with you on the day of your procedure.  If you are age 70 or older, your body mass index should be between 23-30. Your Body mass index is 32.89 kg/m. If this is out of the aforementioned range listed, please consider follow up with your Primary Care Provider.  Due to recent changes in healthcare laws, you may see the results of your imaging and laboratory studies on MyChart before your provider has had a chance to review them.  We understand that in some cases there may be results that are confusing or concerning to you. Not all laboratory results come back in the same time frame and the provider may be waiting for multiple results in order to interpret others.  Please give [place] 48 hours in order for your provider to thoroughly review all the results before contacting the office for clarification of your results.

## 2020-07-11 ENCOUNTER — Other Ambulatory Visit: Payer: Self-pay | Admitting: Interventional Cardiology

## 2020-07-13 NOTE — Telephone Encounter (Signed)
   Patient Name: [name] [name]  DOB: 04-13-1950  MRN: [id number]   Primary Cardiologist: Larae Grooms, MD  Chart reviewed as part of pre-operative protocol coverage. Patient has upcoming endoscopy planned on 08/25/2020 and we were asked to give our recommendations for holding Xarelto. Per Dr. Oval Linsey, [place] to hold Xarelto for 2 days prior to procedure. This should be restarted as soon as possible following endoscopy.  I will route this recommendation to the requesting party via Epic fax function and remove from pre-op pool.  Please call with questions.  Darreld Mclean, PA-C 07/13/2020, 8:16 AM

## 2020-07-13 NOTE — Telephone Encounter (Signed)
I have spoken to patient to advise that per cardiology, he may hold xarelto 2 days prior to his upcoming procedure. Patient verbalizes clear understanding of this information.

## 2020-07-29 DIAGNOSIS — G4710 Hypersomnia, unspecified: Secondary | ICD-10-CM | POA: Diagnosis not present

## 2020-08-04 DIAGNOSIS — G4710 Hypersomnia, unspecified: Secondary | ICD-10-CM | POA: Diagnosis not present

## 2020-08-12 ENCOUNTER — Other Ambulatory Visit: Payer: Self-pay | Admitting: Interventional Cardiology

## 2020-08-20 DIAGNOSIS — D649 Anemia, unspecified: Secondary | ICD-10-CM | POA: Diagnosis not present

## 2020-08-20 DIAGNOSIS — E1129 Type 2 diabetes mellitus with other diabetic kidney complication: Secondary | ICD-10-CM | POA: Diagnosis not present

## 2020-08-20 DIAGNOSIS — I10 Essential (primary) hypertension: Secondary | ICD-10-CM | POA: Diagnosis not present

## 2020-08-20 DIAGNOSIS — N182 Chronic kidney disease, stage 2 (mild): Secondary | ICD-10-CM | POA: Diagnosis not present

## 2020-08-20 DIAGNOSIS — I129 Hypertensive chronic kidney disease with stage 1 through stage 4 chronic kidney disease, or unspecified chronic kidney disease: Secondary | ICD-10-CM | POA: Diagnosis not present

## 2020-08-20 DIAGNOSIS — N1831 Chronic kidney disease, stage 3a: Secondary | ICD-10-CM | POA: Diagnosis not present

## 2020-08-20 DIAGNOSIS — Z79899 Other long term (current) drug therapy: Secondary | ICD-10-CM | POA: Diagnosis not present

## 2020-08-20 DIAGNOSIS — E890 Postprocedural hypothyroidism: Secondary | ICD-10-CM | POA: Diagnosis not present

## 2020-08-20 DIAGNOSIS — R5383 Other fatigue: Secondary | ICD-10-CM | POA: Diagnosis not present

## 2020-08-20 DIAGNOSIS — R61 Generalized hyperhidrosis: Secondary | ICD-10-CM | POA: Diagnosis not present

## 2020-08-20 DIAGNOSIS — K5792 Diverticulitis of intestine, part unspecified, without perforation or abscess without bleeding: Secondary | ICD-10-CM | POA: Diagnosis not present

## 2020-08-20 DIAGNOSIS — E782 Mixed hyperlipidemia: Secondary | ICD-10-CM | POA: Diagnosis not present

## 2020-08-20 DIAGNOSIS — D6949 Other primary thrombocytopenia: Secondary | ICD-10-CM | POA: Diagnosis not present

## 2020-08-25 ENCOUNTER — Other Ambulatory Visit: Payer: Self-pay

## 2020-08-25 ENCOUNTER — Ambulatory Visit (AMBULATORY_SURGERY_CENTER): Payer: Medicare Other | Admitting: Internal Medicine

## 2020-08-25 ENCOUNTER — Encounter: Payer: Self-pay | Admitting: Internal Medicine

## 2020-08-25 VITALS — BP 146/66 | HR 53 | Temp 98.3°F | Resp 15 | Ht 67.0 in | Wt 210.0 lb

## 2020-08-25 DIAGNOSIS — E119 Type 2 diabetes mellitus without complications: Secondary | ICD-10-CM | POA: Diagnosis not present

## 2020-08-25 DIAGNOSIS — D120 Benign neoplasm of cecum: Secondary | ICD-10-CM | POA: Diagnosis not present

## 2020-08-25 DIAGNOSIS — D123 Benign neoplasm of transverse colon: Secondary | ICD-10-CM | POA: Diagnosis not present

## 2020-08-25 DIAGNOSIS — Z86010 Personal history of colonic polyps: Secondary | ICD-10-CM

## 2020-08-25 DIAGNOSIS — D124 Benign neoplasm of descending colon: Secondary | ICD-10-CM

## 2020-08-25 DIAGNOSIS — N183 Chronic kidney disease, stage 3 unspecified: Secondary | ICD-10-CM | POA: Diagnosis not present

## 2020-08-25 DIAGNOSIS — D122 Benign neoplasm of ascending colon: Secondary | ICD-10-CM

## 2020-08-25 DIAGNOSIS — I10 Essential (primary) hypertension: Secondary | ICD-10-CM | POA: Diagnosis not present

## 2020-08-25 MED ORDER — SODIUM CHLORIDE 0.9 % IV SOLN: 500.0000 mL | Freq: Once | INTRAVENOUS | Status: DC

## 2020-08-25 NOTE — Progress Notes (Signed)
Check-in-AER  Vital Signs-CW 

## 2020-08-25 NOTE — Patient Instructions (Addendum)
Handouts Provided:  Diverticulosis and Polyps  RESUME Xarelto tomorrow at prior dose  YOU HAD AN ENDOSCOPIC PROCEDURE TODAY AT [facility]:   Refer to the procedure report that was given to you for any specific questions about what was found during the examination.  If the procedure report does not answer your questions, please call your gastroenterologist to clarify.  If you requested that your care partner not be given the details of your procedure findings, then the procedure report has been included in a sealed envelope for you to review at your convenience later.  YOU SHOULD EXPECT: Some feelings of bloating in the abdomen. Passage of more gas than usual.  Walking can help get rid of the air that was put into your GI tract during the procedure and reduce the bloating. If you had a lower endoscopy (such as a colonoscopy or flexible sigmoidoscopy) you may notice spotting of blood in your stool or on the toilet paper. If you underwent a bowel prep for your procedure, you may not have a normal bowel movement for a few days.  Please Note:  You might notice some irritation and congestion in your nose or some drainage.  This is from the oxygen used during your procedure.  There is no need for concern and it should clear up in a day or so.  SYMPTOMS TO REPORT IMMEDIATELY:   Following lower endoscopy (colonoscopy or flexible sigmoidoscopy):  Excessive amounts of blood in the stool  Significant tenderness or worsening of abdominal pains  Swelling of the abdomen that is new, acute  Fever of 100F or higher  For urgent or emergent issues, a gastroenterologist can be reached at any hour by calling [phone]. Do not use MyChart messaging for urgent concerns.    DIET:  We do recommend a small meal at first, but then you may proceed to your regular diet.  Drink plenty of fluids but you should avoid alcoholic beverages for 24 hours.  ACTIVITY:  You should plan to take it easy for  the rest of today and you should NOT DRIVE or use heavy machinery until tomorrow (because of the sedation medicines used during the test).    FOLLOW UP: Our staff will call the number listed on your records 48-72 hours following your procedure to check on you and address any questions or concerns that you may have regarding the information given to you following your procedure. If we do not reach you, we will leave a message.  We will attempt to reach you two times.  During this call, we will ask if you have developed any symptoms of COVID 19. If you develop any symptoms (ie: fever, flu-like symptoms, shortness of breath, cough etc.) before then, please call [phone].  If you test positive for Covid 19 in the 2 weeks post procedure, please call and report this information to [place].    If any biopsies were taken you will be contacted by phone or by letter within the next 1-3 weeks.  Please call us at [phone] if you have not heard about the biopsies in 3 weeks.    SIGNATURES/CONFIDENTIALITY: You and/or your care partner have signed paperwork which will be entered into your electronic medical record.  These signatures attest to the fact that that the information above on your After Visit Summary has been reviewed and is understood.  Full responsibility of the confidentiality of this discharge information lies with you and/or your care-partner.

## 2020-08-25 NOTE — Progress Notes (Signed)
Called to room to assist during endoscopic procedure.  Patient ID and intended procedure confirmed with present staff. Received instructions for my participation in the procedure from the performing physician.  

## 2020-08-25 NOTE — Progress Notes (Signed)
Report to PACU, RN, vss, BBS= Clear.  

## 2020-08-25 NOTE — Op Note (Addendum)
[facility] Patient Name: [name] [name] Procedure Date: 08/25/2020 9:01 AM MRN: [id number] Endoscopist: Jerene Bears , MD Age: 70 Referring MD:  Date of Birth: 1950/04/17 Gender: Male Account #: [id number] Procedure:                Colonoscopy Indications:              High risk colon cancer surveillance: Personal                            history of multiple (3 or more) adenomas, Last                            colonoscopy: December 2018 Medicines:                Monitored Anesthesia Care Procedure:                Pre-Anesthesia Assessment:                           - Prior to the procedure, a History and Physical                            was performed, and patient medications and                            allergies were reviewed. The patient's tolerance of                            previous anesthesia was also reviewed. The risks                            and benefits of the procedure and the sedation                            options and risks were discussed with the patient.                            All questions were answered, and informed consent                            was obtained. Prior Anticoagulants: The patient has                            taken Xarelto (rivaroxaban), last dose was 2 days                            prior to procedure. ASA Grade Assessment: III - A                            patient with severe systemic disease. After                            reviewing the risks and benefits, the patient was  deemed in satisfactory condition to undergo the                            procedure.                           After obtaining informed consent, the colonoscope                            was passed under direct vision. Throughout the                            procedure, the patient's blood pressure, pulse, and                            oxygen saturations were monitored continuously. The                             Olympus CF-[id number]) Colonoscope was                            introduced through the anus and advanced to the                            cecum, identified by appendiceal orifice and                            ileocecal valve. The colonoscopy was performed                            without difficulty. The patient tolerated the                            procedure well. The quality of the bowel                            preparation was good. The ileocecal valve,                            appendiceal orifice, and rectum were photographed. Scope In: 9:07:39 AM Scope Out: 9:30:55 AM Scope Withdrawal Time: 0 hours 18 minutes 25 seconds  Total Procedure Duration: 0 hours 23 minutes 16 seconds  Findings:                 The digital rectal exam was normal.                           A 4 mm polyp was found in the cecum. The polyp was                            sessile. The polyp was removed with a cold snare.                            Resection and retrieval were complete.  Two sessile polyps were found in the ascending                            colon. The polyps were 3 to 5 mm in size. These                            polyps were removed with a cold snare. Resection                            and retrieval were complete.                           Two sessile polyps were found in the transverse                            colon. The polyps were 4 to 6 mm in size. These                            polyps were removed with a cold snare. Resection                            and retrieval were complete.                           Three sessile polyps were found in the descending                            colon. The polyps were 3 to 5 mm in size. These                            polyps were removed with a cold snare. Resection                            and retrieval were complete.                           Multiple small and large-mouthed diverticula were                             found in the sigmoid colon, descending colon and                            ascending colon.                           Internal hemorrhoids were found during                            retroflexion. The hemorrhoids were small. Complications:            No immediate complications. Estimated Blood Loss:     Estimated blood loss was minimal. Impression:               - One 4 mm polyp in the cecum, removed with  a cold                            snare. Resected and retrieved.                           - Two 3 to 5 mm polyps in the ascending colon,                            removed with a cold snare. Resected and retrieved.                           - Two 4 to 6 mm polyps in the transverse colon,                            removed with a cold snare. Resected and retrieved.                           - Three 3 to 5 mm polyps in the descending colon,                            removed with a cold snare. Resected and retrieved.                           - Diverticulosis in the sigmoid colon, in the                            descending colon and in the ascending colon.                           - Small internal hemorrhoids. Recommendation:           - Patient has a contact number available for                            emergencies. The signs and symptoms of potential                            delayed complications were discussed with the                            patient. Return to normal activities tomorrow.                            Written discharge instructions were provided to the                            patient.                           - Resume previous diet.                           - Continue present medications.                           -  Await pathology results.                           - Repeat colonoscopy is recommended for                            surveillance. The colonoscopy date will be                            determined after pathology results  from today's                            exam become available for review.                           - Resume Xarelto (rivaroxaban) at prior dose                            tomorrow. Refer to managing physician for further                            adjustment of therapy. Jerene Bears, MD 08/25/2020 9:34:36 AM This report has been signed electronically.

## 2020-08-26 DIAGNOSIS — D0359 Melanoma in situ of other part of trunk: Secondary | ICD-10-CM | POA: Diagnosis not present

## 2020-08-26 DIAGNOSIS — D0361 Melanoma in situ of right upper limb, including shoulder: Secondary | ICD-10-CM | POA: Diagnosis not present

## 2020-08-27 ENCOUNTER — Telehealth: Payer: Self-pay

## 2020-08-27 DIAGNOSIS — D225 Melanocytic nevi of trunk: Secondary | ICD-10-CM | POA: Diagnosis not present

## 2020-08-27 DIAGNOSIS — L821 Other seborrheic keratosis: Secondary | ICD-10-CM | POA: Diagnosis not present

## 2020-08-27 DIAGNOSIS — D0361 Melanoma in situ of right upper limb, including shoulder: Secondary | ICD-10-CM | POA: Diagnosis not present

## 2020-08-27 DIAGNOSIS — D1801 Hemangioma of skin and subcutaneous tissue: Secondary | ICD-10-CM | POA: Diagnosis not present

## 2020-08-27 DIAGNOSIS — D0359 Melanoma in situ of other part of trunk: Secondary | ICD-10-CM | POA: Diagnosis not present

## 2020-08-27 DIAGNOSIS — L814 Other melanin hyperpigmentation: Secondary | ICD-10-CM | POA: Diagnosis not present

## 2020-08-27 DIAGNOSIS — D485 Neoplasm of uncertain behavior of skin: Secondary | ICD-10-CM | POA: Diagnosis not present

## 2020-08-27 NOTE — Telephone Encounter (Signed)
  Follow up Call-  Call back number 08/25/2020  Post procedure Call Back phone  # [phone]  Permission to leave phone message Yes  Some recent data might be hidden     Patient questions:  Do you have a fever, pain , or abdominal swelling? No. Pain Score  0 *  Have you tolerated food without any problems? Yes.    Have you been able to return to your normal activities? Yes.    Do you have any questions about your discharge instructions: Diet   No. Medications  No. Follow up visit  No.  Do you have questions or concerns about your Care? No.  Actions: * If pain score is 4 or above: No action needed, pain <4.  1. Have you developed a fever since your procedure? No   2.   Have you had an respiratory symptoms (SOB or cough) since your procedure? No   3.   Have you tested positive for COVID 19 since your procedure no   4.   Have you had any family members/close contacts diagnosed with the COVID 19 since your procedure?  No    If yes to any of these questions please route to Joylene John, RN and Joella Prince, RN

## 2020-08-31 ENCOUNTER — Encounter: Payer: Self-pay | Admitting: Internal Medicine

## 2020-09-02 DIAGNOSIS — M19031 Primary osteoarthritis, right wrist: Secondary | ICD-10-CM | POA: Diagnosis not present

## 2020-09-02 DIAGNOSIS — M79644 Pain in right finger(s): Secondary | ICD-10-CM | POA: Diagnosis not present

## 2020-09-02 DIAGNOSIS — M1811 Unilateral primary osteoarthritis of first carpometacarpal joint, right hand: Secondary | ICD-10-CM | POA: Diagnosis not present

## 2020-09-04 DIAGNOSIS — L0889 Other specified local infections of the skin and subcutaneous tissue: Secondary | ICD-10-CM | POA: Diagnosis not present

## 2020-09-04 DIAGNOSIS — W19XXXA Unspecified fall, initial encounter: Secondary | ICD-10-CM | POA: Diagnosis not present

## 2020-09-04 DIAGNOSIS — M25571 Pain in right ankle and joints of right foot: Secondary | ICD-10-CM | POA: Diagnosis not present

## 2020-09-14 DIAGNOSIS — M25531 Pain in right wrist: Secondary | ICD-10-CM | POA: Diagnosis not present

## 2020-09-14 DIAGNOSIS — Z4789 Encounter for other orthopedic aftercare: Secondary | ICD-10-CM | POA: Diagnosis not present

## 2020-09-14 DIAGNOSIS — M13841 Other specified arthritis, right hand: Secondary | ICD-10-CM | POA: Diagnosis not present

## 2020-10-07 DIAGNOSIS — L988 Other specified disorders of the skin and subcutaneous tissue: Secondary | ICD-10-CM | POA: Diagnosis not present

## 2020-10-07 DIAGNOSIS — D0359 Melanoma in situ of other part of trunk: Secondary | ICD-10-CM | POA: Diagnosis not present

## 2020-10-14 DIAGNOSIS — D0361 Melanoma in situ of right upper limb, including shoulder: Secondary | ICD-10-CM | POA: Diagnosis not present

## 2020-10-14 DIAGNOSIS — L988 Other specified disorders of the skin and subcutaneous tissue: Secondary | ICD-10-CM | POA: Diagnosis not present

## 2020-11-03 ENCOUNTER — Other Ambulatory Visit: Payer: Self-pay | Admitting: Cardiovascular Disease

## 2020-11-03 NOTE — Telephone Encounter (Signed)
Rx(s) sent to pharmacy electronically.  

## 2020-11-06 ENCOUNTER — Other Ambulatory Visit: Payer: Self-pay

## 2020-11-06 ENCOUNTER — Encounter (INDEPENDENT_AMBULATORY_CARE_PROVIDER_SITE_OTHER): Payer: Medicare Other | Admitting: Ophthalmology

## 2020-11-06 DIAGNOSIS — H43813 Vitreous degeneration, bilateral: Secondary | ICD-10-CM

## 2020-11-06 DIAGNOSIS — I10 Essential (primary) hypertension: Secondary | ICD-10-CM | POA: Diagnosis not present

## 2020-11-06 DIAGNOSIS — H2513 Age-related nuclear cataract, bilateral: Secondary | ICD-10-CM

## 2020-11-06 DIAGNOSIS — H353131 Nonexudative age-related macular degeneration, bilateral, early dry stage: Secondary | ICD-10-CM | POA: Diagnosis not present

## 2020-11-06 DIAGNOSIS — H35033 Hypertensive retinopathy, bilateral: Secondary | ICD-10-CM

## 2020-11-14 ENCOUNTER — Other Ambulatory Visit: Payer: Self-pay | Admitting: Cardiovascular Disease

## 2020-11-26 DIAGNOSIS — I10 Essential (primary) hypertension: Secondary | ICD-10-CM | POA: Diagnosis not present

## 2020-11-26 DIAGNOSIS — Z125 Encounter for screening for malignant neoplasm of prostate: Secondary | ICD-10-CM | POA: Diagnosis not present

## 2020-11-26 DIAGNOSIS — E1129 Type 2 diabetes mellitus with other diabetic kidney complication: Secondary | ICD-10-CM | POA: Diagnosis not present

## 2020-12-03 DIAGNOSIS — I129 Hypertensive chronic kidney disease with stage 1 through stage 4 chronic kidney disease, or unspecified chronic kidney disease: Secondary | ICD-10-CM | POA: Diagnosis not present

## 2020-12-03 DIAGNOSIS — M19031 Primary osteoarthritis, right wrist: Secondary | ICD-10-CM | POA: Diagnosis not present

## 2020-12-03 DIAGNOSIS — E1121 Type 2 diabetes mellitus with diabetic nephropathy: Secondary | ICD-10-CM | POA: Diagnosis not present

## 2020-12-03 DIAGNOSIS — I5042 Chronic combined systolic (congestive) and diastolic (congestive) heart failure: Secondary | ICD-10-CM | POA: Diagnosis not present

## 2020-12-03 DIAGNOSIS — I700 Atherosclerosis of aorta: Secondary | ICD-10-CM | POA: Diagnosis not present

## 2020-12-03 DIAGNOSIS — I10 Essential (primary) hypertension: Secondary | ICD-10-CM | POA: Diagnosis not present

## 2020-12-03 DIAGNOSIS — I739 Peripheral vascular disease, unspecified: Secondary | ICD-10-CM | POA: Diagnosis not present

## 2020-12-03 DIAGNOSIS — Z8546 Personal history of malignant neoplasm of prostate: Secondary | ICD-10-CM | POA: Diagnosis not present

## 2020-12-03 DIAGNOSIS — Z0000 Encounter for general adult medical examination without abnormal findings: Secondary | ICD-10-CM | POA: Diagnosis not present

## 2020-12-03 DIAGNOSIS — R82998 Other abnormal findings in urine: Secondary | ICD-10-CM | POA: Diagnosis not present

## 2020-12-03 DIAGNOSIS — G4733 Obstructive sleep apnea (adult) (pediatric): Secondary | ICD-10-CM | POA: Diagnosis not present

## 2020-12-03 DIAGNOSIS — I480 Paroxysmal atrial fibrillation: Secondary | ICD-10-CM | POA: Diagnosis not present

## 2021-01-05 DIAGNOSIS — Z23 Encounter for immunization: Secondary | ICD-10-CM | POA: Diagnosis not present

## 2021-01-17 DIAGNOSIS — Z23 Encounter for immunization: Secondary | ICD-10-CM | POA: Diagnosis not present

## 2021-02-09 DIAGNOSIS — N182 Chronic kidney disease, stage 2 (mild): Secondary | ICD-10-CM | POA: Diagnosis not present

## 2021-02-09 DIAGNOSIS — E782 Mixed hyperlipidemia: Secondary | ICD-10-CM | POA: Diagnosis not present

## 2021-02-09 DIAGNOSIS — E1121 Type 2 diabetes mellitus with diabetic nephropathy: Secondary | ICD-10-CM | POA: Diagnosis not present

## 2021-02-09 DIAGNOSIS — I129 Hypertensive chronic kidney disease with stage 1 through stage 4 chronic kidney disease, or unspecified chronic kidney disease: Secondary | ICD-10-CM | POA: Diagnosis not present

## 2021-02-19 ENCOUNTER — Other Ambulatory Visit: Payer: Self-pay | Admitting: Cardiovascular Disease

## 2021-02-22 NOTE — Telephone Encounter (Signed)
Rx(s) sent to pharmacy electronically.  

## 2021-03-11 DIAGNOSIS — D225 Melanocytic nevi of trunk: Secondary | ICD-10-CM | POA: Diagnosis not present

## 2021-03-11 DIAGNOSIS — L821 Other seborrheic keratosis: Secondary | ICD-10-CM | POA: Diagnosis not present

## 2021-03-11 DIAGNOSIS — D485 Neoplasm of uncertain behavior of skin: Secondary | ICD-10-CM | POA: Diagnosis not present

## 2021-03-11 DIAGNOSIS — C44519 Basal cell carcinoma of skin of other part of trunk: Secondary | ICD-10-CM | POA: Diagnosis not present

## 2021-03-11 DIAGNOSIS — D1801 Hemangioma of skin and subcutaneous tissue: Secondary | ICD-10-CM | POA: Diagnosis not present

## 2021-03-11 DIAGNOSIS — D2262 Melanocytic nevi of left upper limb, including shoulder: Secondary | ICD-10-CM | POA: Diagnosis not present

## 2021-03-11 DIAGNOSIS — Z85820 Personal history of malignant melanoma of skin: Secondary | ICD-10-CM | POA: Diagnosis not present

## 2021-03-11 DIAGNOSIS — L570 Actinic keratosis: Secondary | ICD-10-CM | POA: Diagnosis not present

## 2021-04-08 DIAGNOSIS — U071 COVID-19: Secondary | ICD-10-CM | POA: Diagnosis not present

## 2021-04-15 DIAGNOSIS — M19031 Primary osteoarthritis, right wrist: Secondary | ICD-10-CM | POA: Diagnosis not present

## 2021-04-15 DIAGNOSIS — N1831 Chronic kidney disease, stage 3a: Secondary | ICD-10-CM | POA: Diagnosis not present

## 2021-04-15 DIAGNOSIS — I480 Paroxysmal atrial fibrillation: Secondary | ICD-10-CM | POA: Diagnosis not present

## 2021-04-15 DIAGNOSIS — E1121 Type 2 diabetes mellitus with diabetic nephropathy: Secondary | ICD-10-CM | POA: Diagnosis not present

## 2021-04-15 DIAGNOSIS — N200 Calculus of kidney: Secondary | ICD-10-CM | POA: Diagnosis not present

## 2021-04-15 DIAGNOSIS — E669 Obesity, unspecified: Secondary | ICD-10-CM | POA: Diagnosis not present

## 2021-04-15 DIAGNOSIS — I129 Hypertensive chronic kidney disease with stage 1 through stage 4 chronic kidney disease, or unspecified chronic kidney disease: Secondary | ICD-10-CM | POA: Diagnosis not present

## 2021-04-15 DIAGNOSIS — I700 Atherosclerosis of aorta: Secondary | ICD-10-CM | POA: Diagnosis not present

## 2021-04-15 DIAGNOSIS — I739 Peripheral vascular disease, unspecified: Secondary | ICD-10-CM | POA: Diagnosis not present

## 2021-04-15 DIAGNOSIS — G4733 Obstructive sleep apnea (adult) (pediatric): Secondary | ICD-10-CM | POA: Diagnosis not present

## 2021-04-15 DIAGNOSIS — E782 Mixed hyperlipidemia: Secondary | ICD-10-CM | POA: Diagnosis not present

## 2021-05-02 ENCOUNTER — Encounter (HOSPITAL_BASED_OUTPATIENT_CLINIC_OR_DEPARTMENT_OTHER): Payer: Self-pay | Admitting: Cardiovascular Disease

## 2021-05-03 MED ORDER — CARVEDILOL 12.5 MG PO TABS: ORAL_TABLET | ORAL | 3 refills | Status: DC

## 2021-05-27 DIAGNOSIS — U071 COVID-19: Secondary | ICD-10-CM | POA: Diagnosis not present

## 2021-06-15 ENCOUNTER — Telehealth: Payer: Self-pay | Admitting: Cardiovascular Disease

## 2021-06-15 DIAGNOSIS — N1831 Chronic kidney disease, stage 3a: Secondary | ICD-10-CM | POA: Diagnosis not present

## 2021-06-15 DIAGNOSIS — E669 Obesity, unspecified: Secondary | ICD-10-CM | POA: Diagnosis not present

## 2021-06-15 DIAGNOSIS — I129 Hypertensive chronic kidney disease with stage 1 through stage 4 chronic kidney disease, or unspecified chronic kidney disease: Secondary | ICD-10-CM | POA: Diagnosis not present

## 2021-06-15 DIAGNOSIS — E782 Mixed hyperlipidemia: Secondary | ICD-10-CM | POA: Diagnosis not present

## 2021-06-15 DIAGNOSIS — E1129 Type 2 diabetes mellitus with other diabetic kidney complication: Secondary | ICD-10-CM | POA: Diagnosis not present

## 2021-06-15 NOTE — Telephone Encounter (Signed)
Responded to patient via my chart message.

## 2021-06-15 NOTE — Telephone Encounter (Signed)
Patient calling the office for samples of medication: ? ? ?1.  What medication and dosage are you requesting samples for? MULTAQ 400 MG tablet ? ?2.  Are you currently out of this medication? No, has a couple weeks ? ? ?Patient's wife calling to request samples. She says their insurance changed and they have filled out a form for assistance that they will drop off at the office.  ?  ?

## 2021-06-21 ENCOUNTER — Telehealth (HOSPITAL_BASED_OUTPATIENT_CLINIC_OR_DEPARTMENT_OTHER): Payer: Self-pay

## 2021-06-21 NOTE — Telephone Encounter (Signed)
New message   Wife Horris Latino at the front desk with paperwork that needs to be filled out by Dr. Oval Linsey.   Paperwork put in MD box for review.

## 2021-06-22 NOTE — Telephone Encounter (Signed)
Papers faxed, confirmation received  ?

## 2021-06-22 NOTE — Telephone Encounter (Signed)
RN to look for forms today ?

## 2021-06-22 NOTE — Telephone Encounter (Signed)
FYI

## 2021-06-30 NOTE — Telephone Encounter (Signed)
Wife aware and they will be by tomorrow to pick up  ?

## 2021-06-30 NOTE — Telephone Encounter (Signed)
Received Multaq 3 bottles from patient assistance  ?Left message to call back  ?

## 2021-07-02 DIAGNOSIS — H0102B Squamous blepharitis left eye, upper and lower eyelids: Secondary | ICD-10-CM | POA: Diagnosis not present

## 2021-07-02 DIAGNOSIS — H0288A Meibomian gland dysfunction right eye, upper and lower eyelids: Secondary | ICD-10-CM | POA: Diagnosis not present

## 2021-07-02 DIAGNOSIS — H2513 Age-related nuclear cataract, bilateral: Secondary | ICD-10-CM | POA: Diagnosis not present

## 2021-07-02 DIAGNOSIS — E119 Type 2 diabetes mellitus without complications: Secondary | ICD-10-CM | POA: Diagnosis not present

## 2021-07-02 DIAGNOSIS — H43812 Vitreous degeneration, left eye: Secondary | ICD-10-CM | POA: Diagnosis not present

## 2021-07-02 DIAGNOSIS — H0102A Squamous blepharitis right eye, upper and lower eyelids: Secondary | ICD-10-CM | POA: Diagnosis not present

## 2021-07-02 DIAGNOSIS — H0288B Meibomian gland dysfunction left eye, upper and lower eyelids: Secondary | ICD-10-CM | POA: Diagnosis not present

## 2021-07-03 ENCOUNTER — Encounter (HOSPITAL_BASED_OUTPATIENT_CLINIC_OR_DEPARTMENT_OTHER): Payer: Self-pay | Admitting: Cardiovascular Disease

## 2021-07-03 DIAGNOSIS — I4821 Permanent atrial fibrillation: Secondary | ICD-10-CM

## 2021-07-05 MED ORDER — RIVAROXABAN 20 MG PO TABS: ORAL_TABLET | ORAL | 3 refills | Status: DC

## 2021-07-23 DIAGNOSIS — N390 Urinary tract infection, site not specified: Secondary | ICD-10-CM | POA: Diagnosis not present

## 2021-07-26 DIAGNOSIS — R8279 Other abnormal findings on microbiological examination of urine: Secondary | ICD-10-CM | POA: Diagnosis not present

## 2021-07-26 DIAGNOSIS — R310 Gross hematuria: Secondary | ICD-10-CM | POA: Diagnosis not present

## 2021-07-26 DIAGNOSIS — R110 Nausea: Secondary | ICD-10-CM | POA: Diagnosis not present

## 2021-07-26 DIAGNOSIS — N200 Calculus of kidney: Secondary | ICD-10-CM | POA: Diagnosis not present

## 2021-07-26 DIAGNOSIS — N201 Calculus of ureter: Secondary | ICD-10-CM | POA: Diagnosis not present

## 2021-07-26 DIAGNOSIS — R1084 Generalized abdominal pain: Secondary | ICD-10-CM | POA: Diagnosis not present

## 2021-07-26 DIAGNOSIS — R509 Fever, unspecified: Secondary | ICD-10-CM | POA: Diagnosis not present

## 2021-07-29 DIAGNOSIS — U071 COVID-19: Secondary | ICD-10-CM | POA: Diagnosis not present

## 2021-08-03 DIAGNOSIS — Z20822 Contact with and (suspected) exposure to covid-19: Secondary | ICD-10-CM | POA: Diagnosis not present

## 2021-08-09 ENCOUNTER — Encounter (HOSPITAL_BASED_OUTPATIENT_CLINIC_OR_DEPARTMENT_OTHER): Payer: Self-pay | Admitting: Cardiovascular Disease

## 2021-08-09 ENCOUNTER — Encounter (HOSPITAL_COMMUNITY): Payer: Self-pay | Admitting: Emergency Medicine

## 2021-08-09 ENCOUNTER — Emergency Department (HOSPITAL_COMMUNITY)
Admission: EM | Admit: 2021-08-09 | Discharge: 2021-08-09 | Disposition: A | Discharge status: Home / Self Care | Payer: Medicare Other | Attending: Emergency Medicine | Admitting: Emergency Medicine

## 2021-08-09 DIAGNOSIS — R319 Hematuria, unspecified: Secondary | ICD-10-CM | POA: Diagnosis not present

## 2021-08-09 DIAGNOSIS — N189 Chronic kidney disease, unspecified: Secondary | ICD-10-CM | POA: Insufficient documentation

## 2021-08-09 DIAGNOSIS — E860 Dehydration: Secondary | ICD-10-CM | POA: Diagnosis not present

## 2021-08-09 DIAGNOSIS — R112 Nausea with vomiting, unspecified: Secondary | ICD-10-CM | POA: Insufficient documentation

## 2021-08-09 DIAGNOSIS — D649 Anemia, unspecified: Secondary | ICD-10-CM | POA: Insufficient documentation

## 2021-08-09 DIAGNOSIS — R8271 Bacteriuria: Secondary | ICD-10-CM | POA: Diagnosis not present

## 2021-08-09 DIAGNOSIS — N201 Calculus of ureter: Secondary | ICD-10-CM | POA: Diagnosis not present

## 2021-08-09 DIAGNOSIS — R300 Dysuria: Secondary | ICD-10-CM | POA: Diagnosis present

## 2021-08-09 DIAGNOSIS — N390 Urinary tract infection, site not specified: Secondary | ICD-10-CM

## 2021-08-09 DIAGNOSIS — R509 Fever, unspecified: Secondary | ICD-10-CM | POA: Diagnosis not present

## 2021-08-09 LAB — CBC WITH DIFFERENTIAL/PLATELET
Abs Immature Granulocytes: 0.02 10*3/uL (ref 0.00–0.07)
Basophils Absolute: 0 10*3/uL (ref 0.0–0.1)
Basophils Relative: 0 %
Eosinophils Absolute: 0 10*3/uL (ref 0.0–0.5)
Eosinophils Relative: 0 %
HCT: 38.6 % — ABNORMAL LOW (ref 39.0–52.0)
Hemoglobin: 12 g/dL — ABNORMAL LOW (ref 13.0–17.0)
Immature Granulocytes: 0 %
Lymphocytes Relative: 16 %
Lymphs Abs: 1 10*3/uL (ref 0.7–4.0)
MCH: 26.3 pg (ref 26.0–34.0)
MCHC: 31.1 g/dL (ref 30.0–36.0)
MCV: 84.6 fL (ref 80.0–100.0)
Monocytes Absolute: 0.8 10*3/uL (ref 0.1–1.0)
Monocytes Relative: 13 %
Neutro Abs: 4.1 10*3/uL (ref 1.7–7.7)
Neutrophils Relative %: 71 %
Platelets: 160 10*3/uL (ref 150–400)
RBC: 4.56 MIL/uL (ref 4.22–5.81)
RDW: 16.6 % — ABNORMAL HIGH (ref 11.5–15.5)
WBC: 5.9 10*3/uL (ref 4.0–10.5)
nRBC: 0 % (ref 0.0–0.2)

## 2021-08-09 LAB — URINALYSIS, ROUTINE W REFLEX MICROSCOPIC
Bilirubin Urine: NEGATIVE
Glucose, UA: >=500 mg/dL — AB
Ketones, ur: NEGATIVE mg/dL
Nitrite: NEGATIVE
Protein, ur: 30 mg/dL — AB
RBC / HPF: >50 RBC/hpf — ABNORMAL HIGH (ref 0–5)
Specific Gravity, Urine: 1.02 (ref 1.005–1.030)
WBC, UA: >50 WBC/hpf — ABNORMAL HIGH (ref 0–5)
pH: 5 (ref 5.0–8.0)

## 2021-08-09 LAB — COMPREHENSIVE METABOLIC PANEL
ALT: 35 U/L (ref 0–44)
AST: 42 U/L — ABNORMAL HIGH (ref 15–41)
Albumin: 3.5 g/dL (ref 3.5–5.0)
Alkaline Phosphatase: 68 U/L (ref 38–126)
Anion gap: 8 (ref 5–15)
BUN: 28 mg/dL — ABNORMAL HIGH (ref 8–23)
CO2: 18 mmol/L — ABNORMAL LOW (ref 22–32)
Calcium: 8.4 mg/dL — ABNORMAL LOW (ref 8.9–10.3)
Chloride: 109 mmol/L (ref 98–111)
Creatinine, Ser: 2.17 mg/dL — ABNORMAL HIGH (ref 0.61–1.24)
GFR, Estimated: 32 mL/min — ABNORMAL LOW (ref >60)
Glucose, Bld: 102 mg/dL — ABNORMAL HIGH (ref 70–99)
Potassium: 4.6 mmol/L (ref 3.5–5.1)
Sodium: 135 mmol/L (ref 135–145)
Total Bilirubin: 0.8 mg/dL (ref 0.3–1.2)
Total Protein: 7 g/dL (ref 6.5–8.1)

## 2021-08-09 LAB — LACTIC ACID, PLASMA
Lactic Acid, Venous: 2.5 mmol/L (ref 0.5–1.9)
Lactic Acid, Venous: 1.2 mmol/L (ref 0.5–1.9)

## 2021-08-09 MED ORDER — SODIUM CHLORIDE 0.9 % IV BOLUS
1000.0000 mL | Freq: Once | INTRAVENOUS | Status: AC
  Administered 2021-08-09: 1000 mL via INTRAVENOUS

## 2021-08-09 NOTE — Discharge Instructions (Addendum)
You were seen in the emergency department for low blood pressure in the setting of a recent urinary tract infection.  Your lab work showed worsening of your kidney function.  You were given IV fluids with improvement in your symptoms.  Please stay well-hydrated and follow-up with your doctor for repeat blood work.  Return to the emergency department if any worsening or concerning symptoms. ?

## 2021-08-09 NOTE — ED Triage Notes (Signed)
Patient sent from urology where he was found to be hypotensive. Currently being treated for UTI. States febrile with N/V this weekend. ?

## 2021-08-09 NOTE — ED Provider Notes (Signed)
?[facility] DEPT ?Provider Note ? ? ?CSN: [id number] ?Arrival date & time: 08/09/21  1203 ? ?  ? ?History ? ?Chief Complaint  ?Patient presents with  ? Urinary Tract Infection  ? ? ?[name] [name] is a 71 y.o. male.  He was seen earlier today at [facility] urology for follow-up for urinary tract infection and found to have low blood pressure.  About 2 weeks ago he had hematuria and dysuria and was put on Keflex.  His urine culture grew out E. coli.  His CAT scan last week showed some kidney stones in the kidney but no hydronephrosis.  He finished the antibiotics 4 days ago and began experiencing high fevers nausea and vomiting.  On-call primary care doctor ordered him Bactrim.  Today he feels much better and has not had a fever.  No cough or shortness of breath no diarrhea.  Urinary symptoms are improved. ? ?The history is provided by the patient.  ?Urinary Tract Infection ?Presenting symptoms: dysuria   ?Context: during urination   ?Relieved by:  Prescription drugs ?Worsened by:  Nothing ?Ineffective treatments:  Rest ?Associated symptoms: fever, hematuria, nausea and vomiting   ?Associated symptoms: no abdominal pain, no diarrhea and no urinary retention   ?Risk factors: recent infection   ? ?  ? ?Home Medications ?Prior to Admission medications   ?Medication Sig Start Date End Date Taking? Authorizing Provider  ?acetaminophen (TYLENOL) 500 MG tablet Take 1,000 mg by mouth every 6 (six) hours as needed for moderate pain or headache.    [provider]  ?ALPRAZolam Duanne Moron) 1 MG tablet Take 1 tablet (1 mg total) by mouth 3 (three) times daily as needed for anxiety. 02/27/20   Roseanne Kaufman, MD  ?bismuth subsalicylate (PEPTO BISMOL) 262 MG/15ML suspension Take 30 mLs by mouth every 6 (six) hours as needed for indigestion or diarrhea or loose stools.    [provider]  ?carvedilol (COREG) 12.5 MG tablet Take 3 tablets to equal 37.'5mg'$  twice daily 05/03/21   Skeet Latch, MD  ?DULoxetine (CYMBALTA) 60 MG capsule Take 60 mg by mouth daily. 03/27/20   [provider]  ?esomeprazole (NEXIUM) 40 MG capsule Take 40 mg by mouth daily.     [provider]  ?FOLIC ACID PO Take 4,158 mcg by mouth daily.    [provider]  ?Glucosamine HCl 1500 MG TABS Take 1,500 mg by mouth in the morning and at bedtime.    [provider]  ?hydrochlorothiazide (HYDRODIURIL) 12.5 MG tablet Take 1 tablet by mouth daily. 06/19/15   [provider]  ?HYDROmorphone (DILAUDID) 2 MG tablet Take 2 mg by mouth 2 (two) times daily as needed (pain.). 02/27/20   [provider]  ?JARDIANCE 25 MG TABS tablet Take 25 mg by mouth daily. 06/12/19   [provider]  ?levothyroxine (SYNTHROID) 175 MCG tablet Take 175 mcg by mouth daily. 07/01/19   [provider]  ?losartan (COZAAR) 50 MG tablet Take 50 mg by mouth every evening. 07/02/19   [provider]  ?LYSINE PO Take 1 tablet by mouth daily as needed (cold sores).    [provider]  ?Meclizine HCl (BONINE PO) Take 1 tablet by mouth daily as needed (nausea).    [provider]  ?metFORMIN (GLUCOPHAGE-XR) 500 MG 24 hr tablet Take 1,000 mg by mouth 2 (two) times daily. 07/15/19   [provider]  ?methocarbamol (ROBAXIN) 500 MG tablet Take 500 mg by mouth every 8 (eight) hours  as needed for muscle spasms. 02/26/20   [provider]  ?MULTAQ 400 MG tablet TAKE 1 TABLET TWICE DAILY  WITH MEALS 11/16/20   Skeet Latch, MD  ?Multiple Vitamin (MULTIVITAMIN WITH MINERALS) TABS tablet Take 1 tablet by mouth daily. One-A-Day for Men    [provider]  ?Multiple Vitamins-Minerals (PRESERVISION AREDS 2) CAPS Take 1 capsule by mouth 2 (two) times daily.    [provider]  ?mupirocin ointment (BACTROBAN) 2 % Apply 1 application topically 2 (two) times daily as needed (skin wounds). 01/30/20   [provider]  ?ondansetron (ZOFRAN) 4  MG tablet Take 4 mg by mouth 2 (two) times daily as needed for nausea.  01/14/20   [provider]  ?Roma Schanz test strip 1 each daily. 02/21/20   [provider]  ?OZEMPIC, 0.25 OR 0.5 MG/DOSE, 2 MG/1.5ML SOPN Inject 0.5 mg into the skin every Thursday. 01/08/20   [provider]  ?polycarbophil (FIBERCON) 625 MG tablet Take 1,250 mg by mouth every evening.    [provider]  ?potassium chloride SA (KLOR-CON) 20 MEQ tablet Take 40 mEq by mouth 2 (two) times daily.    [provider]  ?pravastatin (PRAVACHOL) 40 MG tablet Take 40 mg by mouth daily. 07/16/20   [provider]  ?Probiotic Product (PROBIOTIC PO) Take 1 capsule by mouth daily.    [provider]  ?rivaroxaban (XARELTO) 20 MG TABS tablet TAKE 1 TABLET DAILY WITH   SUPPER 07/05/21   Skeet Latch, MD  ?tamsulosin (FLOMAX) 0.4 MG CAPS capsule Take 0.4 mg by mouth daily as needed (kidney stones).  06/10/19   [provider]  ?vitamin B-12 (CYANOCOBALAMIN) 1000 MCG tablet Take 1,000 mcg by mouth daily.    [provider]  ?   ? ?Allergies    ?Bystolic [nebivolol hcl], Calcium channel blockers, Phenergan [promethazine hcl], Beta adrenergic blockers, Cardizem [diltiazem], Norvasc [amlodipine], Promethazine, Aspirin, Nsaids, and Prednisone   ? ?Review of Systems   ?Review of Systems  ?Constitutional:  Positive for fever.  ?HENT:  Negative for sore throat.   ?Eyes:  Negative for visual disturbance.  ?Respiratory:  Negative for shortness of breath.   ?Cardiovascular:  Negative for chest pain.  ?Gastrointestinal:  Positive for nausea and vomiting. Negative for abdominal pain and diarrhea.  ?Genitourinary:  Positive for dysuria and hematuria.  ?Musculoskeletal:  Negative for back pain.  ?Skin:  Negative for rash.  ?Neurological:  Negative for headaches.  ? ?Physical Exam ?Updated Vital Signs ?BP 110/67   Pulse 61   Temp 99.3 ?F (37.4 ?C) (Oral)   Resp 18   SpO2 98%  ?Physical  Exam ?Vitals and nursing note reviewed.  ?Constitutional:   ?   General: He is not in acute distress. ?   Appearance: Normal appearance. He is well-developed.  ?HENT:  ?   Head: Normocephalic and atraumatic.  ?Eyes:  ?   Conjunctiva/sclera: Conjunctivae normal.  ?Cardiovascular:  ?   Rate and Rhythm: Normal rate and regular rhythm.  ?   Heart sounds: No murmur heard. ?Pulmonary:  ?   Effort: Pulmonary effort is normal. No respiratory distress.  ?   Breath sounds: Normal breath sounds.  ?Abdominal:  ?   Palpations: Abdomen is soft.  ?   Tenderness: There is no abdominal tenderness. There is no right CVA tenderness, left CVA tenderness, guarding or rebound.  ?Musculoskeletal:     ?   General: No swelling.  ?   Cervical back: Neck supple.  ?  Right lower leg: No edema.  ?   Left lower leg: No edema.  ?Skin: ?   General: Skin is warm and dry.  ?   Capillary Refill: Capillary refill takes less than 2 seconds.  ?Neurological:  ?   General: No focal deficit present.  ?   Mental Status: He is alert.  ? ? ?ED Results / Procedures / Treatments   ?Labs ?(all labs ordered are listed, but only abnormal results are displayed) ?Labs Reviewed  ?COMPREHENSIVE METABOLIC PANEL - Abnormal; Notable for the following components:  ?    Result Value  ? CO2 18 (*)   ? Glucose, Bld 102 (*)   ? BUN 28 (*)   ? Creatinine, Ser 2.17 (*)   ? Calcium 8.4 (*)   ? AST 42 (*)   ? GFR, Estimated 32 (*)   ? All other components within normal limits  ?LACTIC ACID, PLASMA - Abnormal; Notable for the following components:  ? Lactic Acid, Venous 2.5 (*)   ? All other components within normal limits  ?CBC WITH DIFFERENTIAL/PLATELET - Abnormal; Notable for the following components:  ? Hemoglobin 12.0 (*)   ? HCT 38.6 (*)   ? RDW 16.6 (*)   ? All other components within normal limits  ?URINALYSIS, ROUTINE W REFLEX MICROSCOPIC - Abnormal; Notable for the following components:  ? APPearance HAZY (*)   ? Glucose, UA >=500 (*)   ? Hgb urine dipstick LARGE (*)    ? Protein, ur 30 (*)   ? Leukocytes,Ua LARGE (*)   ? RBC / HPF >50 (*)   ? WBC, UA >50 (*)   ? Bacteria, UA RARE (*)   ? Crystals PRESENT (*)   ? All other components within normal limits  ?CULTURE, BLOOD (ROUTI

## 2021-08-09 NOTE — Telephone Encounter (Signed)
Please advise 

## 2021-08-10 LAB — URINE CULTURE: Culture: NO GROWTH

## 2021-08-12 DIAGNOSIS — Z20822 Contact with and (suspected) exposure to covid-19: Secondary | ICD-10-CM | POA: Diagnosis not present

## 2021-08-14 LAB — CULTURE, BLOOD (ROUTINE X 2)
Culture: NO GROWTH
Culture: NO GROWTH
Special Requests: ADEQUATE

## 2021-08-16 DIAGNOSIS — I700 Atherosclerosis of aorta: Secondary | ICD-10-CM | POA: Diagnosis not present

## 2021-08-16 DIAGNOSIS — I739 Peripheral vascular disease, unspecified: Secondary | ICD-10-CM | POA: Diagnosis not present

## 2021-08-16 DIAGNOSIS — I480 Paroxysmal atrial fibrillation: Secondary | ICD-10-CM | POA: Diagnosis not present

## 2021-08-16 DIAGNOSIS — E1121 Type 2 diabetes mellitus with diabetic nephropathy: Secondary | ICD-10-CM | POA: Diagnosis not present

## 2021-08-16 DIAGNOSIS — N179 Acute kidney failure, unspecified: Secondary | ICD-10-CM | POA: Diagnosis not present

## 2021-08-16 DIAGNOSIS — I10 Essential (primary) hypertension: Secondary | ICD-10-CM | POA: Diagnosis not present

## 2021-08-16 DIAGNOSIS — E782 Mixed hyperlipidemia: Secondary | ICD-10-CM | POA: Diagnosis not present

## 2021-08-16 DIAGNOSIS — G4733 Obstructive sleep apnea (adult) (pediatric): Secondary | ICD-10-CM | POA: Diagnosis not present

## 2021-08-18 DIAGNOSIS — E782 Mixed hyperlipidemia: Secondary | ICD-10-CM | POA: Diagnosis not present

## 2021-08-18 DIAGNOSIS — I10 Essential (primary) hypertension: Secondary | ICD-10-CM | POA: Diagnosis not present

## 2021-08-18 DIAGNOSIS — E1121 Type 2 diabetes mellitus with diabetic nephropathy: Secondary | ICD-10-CM | POA: Diagnosis not present

## 2021-08-20 DIAGNOSIS — E1129 Type 2 diabetes mellitus with other diabetic kidney complication: Secondary | ICD-10-CM | POA: Diagnosis not present

## 2021-08-20 DIAGNOSIS — I10 Essential (primary) hypertension: Secondary | ICD-10-CM | POA: Diagnosis not present

## 2021-08-20 DIAGNOSIS — E782 Mixed hyperlipidemia: Secondary | ICD-10-CM | POA: Diagnosis not present

## 2021-08-25 DIAGNOSIS — I129 Hypertensive chronic kidney disease with stage 1 through stage 4 chronic kidney disease, or unspecified chronic kidney disease: Secondary | ICD-10-CM | POA: Diagnosis not present

## 2021-08-25 DIAGNOSIS — N3001 Acute cystitis with hematuria: Secondary | ICD-10-CM | POA: Diagnosis not present

## 2021-08-25 DIAGNOSIS — J111 Influenza due to unidentified influenza virus with other respiratory manifestations: Secondary | ICD-10-CM | POA: Diagnosis not present

## 2021-08-25 DIAGNOSIS — R058 Other specified cough: Secondary | ICD-10-CM | POA: Diagnosis not present

## 2021-08-27 DIAGNOSIS — I129 Hypertensive chronic kidney disease with stage 1 through stage 4 chronic kidney disease, or unspecified chronic kidney disease: Secondary | ICD-10-CM | POA: Diagnosis not present

## 2021-08-27 DIAGNOSIS — R058 Other specified cough: Secondary | ICD-10-CM | POA: Diagnosis not present

## 2021-08-27 DIAGNOSIS — J111 Influenza due to unidentified influenza virus with other respiratory manifestations: Secondary | ICD-10-CM | POA: Diagnosis not present

## 2021-09-02 DIAGNOSIS — Z8546 Personal history of malignant neoplasm of prostate: Secondary | ICD-10-CM | POA: Diagnosis not present

## 2021-09-02 DIAGNOSIS — N3000 Acute cystitis without hematuria: Secondary | ICD-10-CM | POA: Diagnosis not present

## 2021-09-05 ENCOUNTER — Emergency Department (HOSPITAL_COMMUNITY): Payer: Medicare Other

## 2021-09-05 ENCOUNTER — Other Ambulatory Visit: Payer: Self-pay

## 2021-09-05 ENCOUNTER — Encounter (HOSPITAL_COMMUNITY): Payer: Self-pay

## 2021-09-05 ENCOUNTER — Emergency Department (HOSPITAL_COMMUNITY)
Admission: EM | Admit: 2021-09-05 | Discharge: 2021-09-05 | Disposition: A | Discharge status: Home / Self Care | Payer: Medicare Other | Attending: Emergency Medicine | Admitting: Emergency Medicine

## 2021-09-05 DIAGNOSIS — N200 Calculus of kidney: Secondary | ICD-10-CM | POA: Diagnosis not present

## 2021-09-05 DIAGNOSIS — R109 Unspecified abdominal pain: Secondary | ICD-10-CM | POA: Diagnosis present

## 2021-09-05 DIAGNOSIS — N201 Calculus of ureter: Secondary | ICD-10-CM | POA: Diagnosis not present

## 2021-09-05 DIAGNOSIS — N23 Unspecified renal colic: Secondary | ICD-10-CM | POA: Insufficient documentation

## 2021-09-05 DIAGNOSIS — K5730 Diverticulosis of large intestine without perforation or abscess without bleeding: Secondary | ICD-10-CM | POA: Diagnosis not present

## 2021-09-05 DIAGNOSIS — K8020 Calculus of gallbladder without cholecystitis without obstruction: Secondary | ICD-10-CM | POA: Diagnosis not present

## 2021-09-05 LAB — URINALYSIS, ROUTINE W REFLEX MICROSCOPIC
Bacteria, UA: NONE SEEN
Bilirubin Urine: NEGATIVE
Glucose, UA: >=500 mg/dL — AB
Ketones, ur: 5 mg/dL — AB
Leukocytes,Ua: NEGATIVE
Nitrite: NEGATIVE
Protein, ur: NEGATIVE mg/dL
Specific Gravity, Urine: 1.02 (ref 1.005–1.030)
pH: 5 (ref 5.0–8.0)

## 2021-09-05 LAB — BASIC METABOLIC PANEL
Anion gap: 9 (ref 5–15)
BUN: 19 mg/dL (ref 8–23)
CO2: 23 mmol/L (ref 22–32)
Calcium: 8.6 mg/dL — ABNORMAL LOW (ref 8.9–10.3)
Chloride: 107 mmol/L (ref 98–111)
Creatinine, Ser: 1.78 mg/dL — ABNORMAL HIGH (ref 0.61–1.24)
GFR, Estimated: 40 mL/min — ABNORMAL LOW (ref >60)
Glucose, Bld: 140 mg/dL — ABNORMAL HIGH (ref 70–99)
Potassium: 3.8 mmol/L (ref 3.5–5.1)
Sodium: 139 mmol/L (ref 135–145)

## 2021-09-05 LAB — CBC WITH DIFFERENTIAL/PLATELET
Abs Immature Granulocytes: 0.21 10*3/uL — ABNORMAL HIGH (ref 0.00–0.07)
Basophils Absolute: 0 10*3/uL (ref 0.0–0.1)
Basophils Relative: 0 %
Eosinophils Absolute: 0.2 10*3/uL (ref 0.0–0.5)
Eosinophils Relative: 2 %
HCT: 38 % — ABNORMAL LOW (ref 39.0–52.0)
Hemoglobin: 11.8 g/dL — ABNORMAL LOW (ref 13.0–17.0)
Immature Granulocytes: 2 %
Lymphocytes Relative: 17 %
Lymphs Abs: 1.5 10*3/uL (ref 0.7–4.0)
MCH: 25.4 pg — ABNORMAL LOW (ref 26.0–34.0)
MCHC: 31.1 g/dL (ref 30.0–36.0)
MCV: 81.7 fL (ref 80.0–100.0)
Monocytes Absolute: 0.9 10*3/uL (ref 0.1–1.0)
Monocytes Relative: 10 %
Neutro Abs: 6 10*3/uL (ref 1.7–7.7)
Neutrophils Relative %: 69 %
Platelets: 202 10*3/uL (ref 150–400)
RBC: 4.65 MIL/uL (ref 4.22–5.81)
RDW: 17.6 % — ABNORMAL HIGH (ref 11.5–15.5)
WBC: 8.9 10*3/uL (ref 4.0–10.5)
nRBC: 0 % (ref 0.0–0.2)

## 2021-09-05 MED ORDER — TAMSULOSIN HCL 0.4 MG PO CAPS: 0.4000 mg | ORAL_CAPSULE | Freq: Every day | ORAL | 0 refills | Status: DC

## 2021-09-05 MED ORDER — HYDROMORPHONE HCL 2 MG/ML IJ SOLN
2.0000 mg | Freq: Once | INTRAMUSCULAR | Status: AC
  Administered 2021-09-05: 2 mg via INTRAVENOUS
  Filled 2021-09-05: qty 1

## 2021-09-05 MED ORDER — ONDANSETRON HCL 4 MG/2ML IJ SOLN
4.0000 mg | Freq: Once | INTRAMUSCULAR | Status: AC
  Administered 2021-09-05: 4 mg via INTRAVENOUS
  Filled 2021-09-05: qty 2

## 2021-09-05 MED ORDER — HYDROMORPHONE HCL 2 MG PO TABS: 2.0000 mg | ORAL_TABLET | ORAL | 0 refills | Status: DC | PRN

## 2021-09-05 MED ORDER — HYDROMORPHONE HCL 1 MG/ML IJ SOLN
1.0000 mg | Freq: Once | INTRAMUSCULAR | Status: AC
  Administered 2021-09-05: 1 mg via INTRAVENOUS
  Filled 2021-09-05: qty 1

## 2021-09-05 NOTE — ED Triage Notes (Signed)
Pt reports with left flank pain since yesterday afternoon. Pt states that he has multiple stones in each kidney. Last Dilaudid dose taken at 7 pm.

## 2021-09-05 NOTE — ED Notes (Signed)
To CT

## 2021-09-05 NOTE — ED Provider Notes (Signed)
[facility] DEPT Provider Note   CSN: [id number] Arrival date & time: 09/05/21  0003     History  Chief Complaint  Patient presents with   Flank Pain    [name] [name] is a 71 y.o. male.  Patient presents to the emergency department for evaluation of left flank pain.  Pain began around 2 PM and has slowly intensified.  Pain is persistently in the left lower back area, constant and severe.  Feels like prior kidney stones.      Home Medications Prior to Admission medications   Medication Sig Start Date End Date Taking? Authorizing Provider  acetaminophen (TYLENOL) 500 MG tablet Take 1,000 mg by mouth every 6 (six) hours as needed for moderate pain or headache.   Yes [provider]  ALPRAZolam Duanne Moron) 1 MG tablet Take 1 tablet (1 mg total) by mouth 3 (three) times daily as needed for anxiety. Patient taking differently: Take 1 mg by mouth daily as needed for anxiety. 02/27/20  Yes Roseanne Kaufman, MD  bismuth subsalicylate (PEPTO BISMOL) 262 MG/15ML suspension Take 30 mLs by mouth every 6 (six) hours as needed for indigestion or diarrhea or loose stools.   Yes [provider]  carvedilol (COREG) 12.5 MG tablet Take 3 tablets to equal 37.'5mg'$  twice daily 05/03/21  Yes Skeet Latch, MD  DULoxetine (CYMBALTA) 60 MG capsule Take 60 mg by mouth daily. 03/27/20  Yes [provider]  esomeprazole (NEXIUM) 40 MG capsule Take 40 mg by mouth daily.    Yes [provider]  FOLIC ACID PO Take 8,003 mcg by mouth daily.   Yes [provider]  Glucosamine HCl 1500 MG TABS Take 1,500 mg by mouth in the morning and at bedtime.   Yes [provider]  HYDROmorphone (DILAUDID) 2 MG tablet Take 2 mg by mouth 2 (two) times daily as needed (pain.). 02/27/20  Yes [provider]  HYDROmorphone (DILAUDID) 2 MG tablet Take 1 tablet (2 mg total) by mouth every 4 (four) hours as needed for severe pain. 09/05/21  Yes  Clotile Whittington, Gwenyth Allegra, MD  JARDIANCE 25 MG TABS tablet Take 25 mg by mouth daily. 06/12/19  Yes [provider]  levothyroxine (SYNTHROID) 175 MCG tablet Take 175 mcg by mouth daily. 07/01/19  Yes [provider]  losartan (COZAAR) 50 MG tablet Take 50 mg by mouth every evening. 07/02/19  Yes [provider]  Meclizine HCl (BONINE PO) Take 1 tablet by mouth daily as needed (nausea).   Yes [provider]  metFORMIN (GLUCOPHAGE-XR) 500 MG 24 hr tablet Take 1,000 mg by mouth 2 (two) times daily. 07/15/19  Yes [provider]  MULTAQ 400 MG tablet TAKE 1 TABLET TWICE DAILY  WITH MEALS Patient taking differently: Take 400 mg by mouth 2 (two) times daily with a meal. 11/16/20  Yes Skeet Latch, MD  Multiple Vitamin (MULTIVITAMIN WITH MINERALS) TABS tablet Take 1 tablet by mouth daily. One-A-Day for Men   Yes [provider]  Multiple Vitamins-Minerals (PRESERVISION AREDS 2) CAPS Take 1 capsule by mouth 2 (two) times daily.   Yes [provider]  mupirocin ointment (BACTROBAN) 2 % Apply 1 application topically 2 (two) times daily as needed (skin wounds). 01/30/20  Yes [provider]  ondansetron (ZOFRAN) 4 MG tablet Take 4 mg by mouth 2 (two) times daily as needed for nausea.  01/14/20  Yes [provider]  polycarbophil (FIBERCON) 625 MG tablet Take 1,250 mg by mouth every  evening.   Yes [provider]  Probiotic Product (PROBIOTIC PO) Take 1 capsule by mouth at bedtime.   Yes [provider]  rosuvastatin (CRESTOR) 10 MG tablet Take 10 mg by mouth at bedtime. 07/05/21  Yes [provider]  tamsulosin (FLOMAX) 0.4 MG CAPS capsule Take 1 capsule (0.4 mg total) by mouth daily. 09/05/21  Yes Meggie Laseter, Gwenyth Allegra, MD  TRULICITY 3.47 QQ/5.9DG SOPN Inject 0.75 mg into the skin once a week. Thursday 06/22/21  Yes [provider]  vitamin B-12 (CYANOCOBALAMIN) 1000 MCG tablet Take 1,000 mcg by mouth  daily.   Yes [provider]  XARELTO 15 MG TABS tablet Take 15 mg by mouth daily. 08/23/21  Yes [provider]  hydrochlorothiazide (HYDRODIURIL) 12.5 MG tablet Take 12.5 mg by mouth daily. 06/19/15   [provider]  LYSINE PO Take 1 tablet by mouth daily as needed (cold sores).    [provider]  [facility] VERIO test strip 1 each daily. 02/21/20   [provider]  potassium chloride SA (KLOR-CON) 20 MEQ tablet Take 40 mEq by mouth 2 (two) times daily. Patient not taking: Reported on 09/05/2021    [provider]  rivaroxaban (XARELTO) 20 MG TABS tablet TAKE 1 TABLET DAILY WITH   SUPPER Patient not taking: Reported on 09/05/2021 07/05/21   Skeet Latch, MD  tamsulosin (FLOMAX) 0.4 MG CAPS capsule Take 0.4 mg by mouth daily as needed (kidney stones).  06/10/19   [provider]      Allergies    Bystolic [nebivolol hcl], Calcium channel blockers, Phenergan [promethazine hcl], Beta adrenergic blockers, Cardizem [diltiazem], Norvasc [amlodipine], Promethazine, Aspirin, Nsaids, and Prednisone    Review of Systems   Review of Systems  Physical Exam Updated Vital Signs BP 122/66   Pulse 66   Temp 98.3 F (36.8 C)   Resp 15   SpO2 92%  Physical Exam Vitals and nursing note reviewed.  Constitutional:      General: He is in acute distress.     Appearance: He is well-developed.  HENT:     Head: Normocephalic and atraumatic.     Mouth/Throat:     Mouth: Mucous membranes are moist.  Eyes:     General: Vision grossly intact. Gaze aligned appropriately.     Extraocular Movements: Extraocular movements intact.     Conjunctiva/sclera: Conjunctivae normal.  Cardiovascular:     Rate and Rhythm: Normal rate and regular rhythm.     Pulses: Normal pulses.     Heart sounds: Normal heart sounds, S1 normal and S2 normal. No murmur heard.   No friction rub. No gallop.  Pulmonary:     Effort: Pulmonary effort is normal. No respiratory  distress.     Breath sounds: Normal breath sounds.  Abdominal:     Palpations: Abdomen is soft.     Tenderness: There is no abdominal tenderness. There is no guarding or rebound.     Hernia: No hernia is present.  Musculoskeletal:        General: No swelling.     Cervical back: Full passive range of motion without pain, normal range of motion and neck supple. No pain with movement, spinous process tenderness or muscular tenderness. Normal range of motion.     Right lower leg: No edema.     Left lower leg: No edema.  Skin:    General: Skin is warm and dry.     Capillary Refill: Capillary refill takes less than 2 seconds.  Findings: No ecchymosis, erythema, lesion or wound.  Neurological:     Mental Status: He is alert and oriented to person, place, and time.     GCS: GCS eye subscore is 4. GCS verbal subscore is 5. GCS motor subscore is 6.     Cranial Nerves: Cranial nerves 2-12 are intact.     Sensory: Sensation is intact.     Motor: Motor function is intact. No weakness or abnormal muscle tone.     Coordination: Coordination is intact.  Psychiatric:        Mood and Affect: Mood normal.        Speech: Speech normal.        Behavior: Behavior normal.    ED Results / Procedures / Treatments   Labs (all labs ordered are listed, but only abnormal results are displayed) Labs Reviewed  URINALYSIS, ROUTINE W REFLEX MICROSCOPIC - Abnormal; Notable for the following components:      Result Value   Glucose, UA >=500 (*)    Hgb urine dipstick SMALL (*)    Ketones, ur 5 (*)    All other components within normal limits  CBC WITH DIFFERENTIAL/PLATELET - Abnormal; Notable for the following components:   Hemoglobin 11.8 (*)    HCT 38.0 (*)    MCH 25.4 (*)    RDW 17.6 (*)    Abs Immature Granulocytes 0.21 (*)    All other components within normal limits  BASIC METABOLIC PANEL - Abnormal; Notable for the following components:   Glucose, Bld 140 (*)    Creatinine, Ser 1.78 (*)     Calcium 8.6 (*)    GFR, Estimated 40 (*)    All other components within normal limits    EKG None  Radiology CT RENAL STONE STUDY  Result Date: 09/05/2021 CLINICAL DATA:  Flank pain. EXAM: CT ABDOMEN AND PELVIS WITHOUT CONTRAST TECHNIQUE: Multidetector CT imaging of the abdomen and pelvis was performed following the standard protocol without IV contrast. RADIATION DOSE REDUCTION: This exam was performed according to the departmental dose-optimization program which includes automated exposure control, adjustment of the mA and/or kV according to patient size and/or use of iterative reconstruction technique. COMPARISON:  CT stone 07/26/2021 FINDINGS: Lower chest: No acute abnormality. Hepatobiliary: Small gallstones are present. There is no biliary ductal dilatation. Liver is within normal limits. Pancreas: Unremarkable. No pancreatic ductal dilatation or surrounding inflammatory changes. Spleen: Normal in size without focal abnormality. Adrenals/Urinary Tract: There is a 3 mm calculus in the proximal left ureter. There is moderate left-sided hydronephrosis and perinephric fat stranding. There are additional bilateral nonobstructing renal calculi, left greater than right. Adrenal gland is within normal limits. The bladder is decompressed. Stomach/Bowel: Stomach is within normal limits. Appendix appears normal. No evidence of bowel wall thickening, distention, or inflammatory changes. There is sigmoid and descending colon diverticulosis without evidence for acute diverticulitis. Vascular/Lymphatic: Aortic atherosclerosis. No enlarged abdominal or pelvic lymph nodes. Reproductive: Prostate gland is small or absent. Other: Small right inguinal hernia containing a portion of the bladder. No ascites or free air. Musculoskeletal: No acute fracture. There are sclerotic changes in the bilateral femoral heads, unchanged which may represent avascular necrosis. There is no evidence for femoral head collapse.  IMPRESSION: 1. 3 mm calculus in the proximal left ureter with moderate obstructive uropathy. 2. Additional nonobstructing bilateral renal calculi. 3. Sigmoid colon diverticulosis. 4. Right inguinal hernia containing a small portion of the bladder. 5. Cholelithiasis. 6. 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 Electronically  Signed   By: Ronney Asters M.D.   On: 09/05/2021 00:59    Procedures Procedures    Medications Ordered in ED Medications  HYDROmorphone (DILAUDID) injection 1 mg (1 mg Intravenous Given 09/05/21 0059)  ondansetron (ZOFRAN) injection 4 mg (4 mg Intravenous Given 09/05/21 0059)  HYDROmorphone (DILAUDID) injection 2 mg (2 mg Intravenous Given 09/05/21 0148)    ED Course/ Medical Decision Making/ A&P                           Medical Decision Making Amount and/or Complexity of Data Reviewed Labs: ordered. Radiology: ordered.  Risk Prescription drug management.   Presents to the emergency department for evaluation of left flank pain.  Differential diagnosis considered includes pyelonephritis, renal colic, diverticulitis, musculoskeletal pain.  Patient has had prior kidney stones with similar pain.  Urinalysis with hematuria, no signs of infection.  CT scan confirms 4 mm stone.  Patient pain-free after analgesia, discharged with analgesia, Flomax, follow-up with his urologist.  Given return precautions.        Final Clinical Impression(s) / ED Diagnoses Final diagnoses:  Ureteral colic    Rx / DC Orders ED Discharge Orders          Ordered    HYDROmorphone (DILAUDID) 2 MG tablet  Every 4 hours PRN        09/05/21 0315    tamsulosin (FLOMAX) 0.4 MG CAPS capsule  Daily        09/05/21 0315              Orpah Greek, MD 09/05/21 [phone]

## 2021-09-08 ENCOUNTER — Telehealth (HOSPITAL_BASED_OUTPATIENT_CLINIC_OR_DEPARTMENT_OTHER): Payer: Self-pay | Admitting: *Deleted

## 2021-09-08 ENCOUNTER — Encounter (HOSPITAL_BASED_OUTPATIENT_CLINIC_OR_DEPARTMENT_OTHER): Payer: Self-pay | Admitting: Cardiovascular Disease

## 2021-09-08 ENCOUNTER — Ambulatory Visit (INDEPENDENT_AMBULATORY_CARE_PROVIDER_SITE_OTHER): Payer: Medicare Other | Admitting: Cardiovascular Disease

## 2021-09-08 VITALS — BP 118/80 | HR 69 | Ht 67.0 in | Wt 225.5 lb

## 2021-09-08 DIAGNOSIS — I480 Paroxysmal atrial fibrillation: Secondary | ICD-10-CM

## 2021-09-08 DIAGNOSIS — E782 Mixed hyperlipidemia: Secondary | ICD-10-CM

## 2021-09-08 DIAGNOSIS — G459 Transient cerebral ischemic attack, unspecified: Secondary | ICD-10-CM

## 2021-09-08 DIAGNOSIS — Z5181 Encounter for therapeutic drug level monitoring: Secondary | ICD-10-CM

## 2021-09-08 DIAGNOSIS — N179 Acute kidney failure, unspecified: Secondary | ICD-10-CM

## 2021-09-08 NOTE — Telephone Encounter (Signed)
XARELTO 15 MG #3 LOT [id number] EXP 4/24  SAMPLES GIVEN AT OFFICE VISIT

## 2021-09-08 NOTE — Patient Instructions (Signed)
Medication Instructions:  Your physician recommends that you continue on your current medications as directed. Please refer to the Current Medication list given to you today.   *If you need a refill on your cardiac medications before your next appointment, please call your pharmacy*  Lab Work: BMET IN 1 WEEK   If you have labs (blood work) drawn today and your tests are completely normal, you will receive your results only by: [place] (if you have MyChart) OR A paper copy in the mail If you have any lab test that is abnormal or we need to change your treatment, we will call you to review the results.  Testing/Procedures: NONE  Follow-Up: At [facility], you and your health needs are our priority.  As part of our continuing mission to provide you with exceptional heart care, we have created designated Provider Care Teams.  These Care Teams include your primary Cardiologist (physician) and Advanced Practice Providers (APPs -  Physician Assistants and Nurse Practitioners) who all work together to provide you with the care you need, when you need it.  We recommend signing up for the patient portal called "MyChart".  Sign up information is provided on this After Visit Summary.  MyChart is used to connect with patients for Virtual Visits (Telemedicine).  Patients are able to view lab/test results, encounter notes, upcoming appointments, etc.  Non-urgent messages can be sent to your provider as well.   To learn more about what you can do with MyChart, go to [email or web address].    Your next appointment:   6 month(s)  The format for your next appointment:   In Person  Provider:   Skeet Latch, MD

## 2021-09-08 NOTE — Progress Notes (Signed)
Cardiology Office Note  Date:  09/08/2021   ID:  [name] [name], [name] [name] 11-12-1950, MRN [id number]  PCP:  Donnajean Lopes, MD  Cardiologist:   Skeet Latch, MD   No chief complaint on file.    History of Present Illness: [name] [name] is a 71 y.o. male with atrial fibrillation s/p ablation x2 [facility]), chronic diastolic heart failure, hypertension, hyperlipidemia, DM, and prior prostate cancerwho presents for follow up.  He had ablations at [facility] most recently in 2016.  That was reportedly complicated by cardiogenic shock requiring 4 days in ICU and intubation.  He last saw Dr. Irish Lack 03/2020 and had recurrent atrial flutter after a recent surgery.  He underwent TEE/DCCV on 03/2020.  He was started back on dronedarone, which he had previously tolerated.  Since then he followed up in the atrial fibrillation clinic.  He had rate controlled atrial fibrillation when last seen 05/26/20.  This was thoguht to be triggered by a UTI and he was successfully cardiovereted 2/22.  Since then he has been doing well and maintaining sinus rhythm.  He has lost 20 lb and no longer snores.   Lately he has been okay from a heart standpoint.  He did have a urinary tract infection and a couple kidney stones.  He currently has a kidney stone and has had some hematuria.  He had a lot of pain which subsided after last night.  He thinks the stone is now in his bladder.  He had some AKI with a urinary tract infection and Xarelto was appropriately reduced to 15 mg.  He sees urology in the morning.  When he had AKI he also was hypotensive.  He notes that although hydrochlorothiazide has been on his medication list he has not taken this in quite a while.  He has been doing a lot of walking lately because he is preparing for an upcoming trip to [place].  He has not had any episodes of atrial fibrillation.  He denies any exertional chest pain or shortness of breath.  He denies lower extremity edema, orthopnea, or PND.   Past  Medical History:  Diagnosis Date   Arthritis    OA   Atrial flutter ([place])    a. s/p RFCA 8/13   Cancer ([place]) 2010   prostate   Chronic combined systolic and diastolic CHF (congestive heart failure) ([place])    a. LVEF previously 35% felt to be due tachycardia;  b. 02/2013 Echo: EF 50-55%, no rwma, mildly dil LA/RA, mild to mod MR. C 11/2014 echo - ef 60-65%, unable to determine DD   CKD (chronic kidney disease), stage III (HCC)    hx of a/c renal failure during episode of diverticulitis 9/13 in [place], [place]   Diabetes mellitus ([place])    TYPE 2    Diverticulitis    Diverticulosis    Dyslipidemia    Dysrhythmia    Erectile dysfunction    Gastric ulcer    s/p prior surgery   GERD (gastroesophageal reflux disease)    Headache    History of colonic diverticulitis    History of prostate cancer    Hypertension    Hypothyroid    Microcytic anemia    Obesities, morbid (HCC)    Obstructive sleep apnea    noncompliant with CPAP   Osteoporosis    PAF (paroxysmal atrial fibrillation) ([place])    a. failed DCCV and multiple anti-arrhythmic drugs (multaq/flecainide);   b. s/p  PVI isolation with ablation of AFib 8/13;  c. repeat PVI 01/2013;  d. 02/2013 repeat DCCV->Amio load/xarelto.   Peptic ulcer disease 1994   Renal calculi    Sleep apnea 2015   has not had sleep apnea since heart intervention   Thrombocytopenia ([place]) 1992   idopathic-treated with danazol   Tubular adenoma of colon     Past Surgical History:  Procedure Laterality Date   ATRIAL FIBRILLATION ABLATION  12/06/11; 02/05/2013   Afib and atrial flutter ablation by Dr Rayann Heman; repeat PVI by Dr Rayann Heman 02/05/2013   ATRIAL FIBRILLATION ABLATION N/A 12/06/2011   Procedure: ATRIAL FIBRILLATION ABLATION;  Surgeon: Thompson Grayer, MD;  Location: [facility] CATH LAB;  Service: Cardiovascular;  Laterality: N/A;   ATRIAL FIBRILLATION ABLATION N/A 02/05/2013   Procedure: ATRIAL FIBRILLATION ABLATION;  Surgeon: Coralyn Mark, MD;  Location: [place] CATH  LAB;  Service: Cardiovascular;  Laterality: N/A;   benign stomach tumor removal  2000   CARDIOVERSION  08/25/2011   Procedure: CARDIOVERSION;  Surgeon: Jettie Booze, MD;  Location: [place];  Service: Cardiovascular;  Laterality: N/A;   CARDIOVERSION N/A 03/05/2013   Procedure: CARDIOVERSION;  Surgeon: Sinclair Grooms, MD;  Location: [place];  Service: Cardiovascular;  Laterality: N/A;  BEDSIDE    CARDIOVERSION N/A 03/25/2020   Procedure: CARDIOVERSION;  Surgeon: Skeet Latch, MD;  Location: [place];  Service: Cardiovascular;  Laterality: N/A;   CARDIOVERSION N/A 06/02/2020   Procedure: CARDIOVERSION;  Surgeon: Pixie Casino, MD;  Location: [facility] ENDOSCOPY;  Service: Cardiovascular;  Laterality: N/A;   [place] Right 02/25/2020   Procedure: RIGHT CARPAL TUNNEL RELEASE, CUBITAL TUNNEL RELEASE IN [name], RIGHT WRIST PROXIMAL ROW CARPECTOMY AND PROXIMAL CAPITATE HEAD REPLACEMENT/HEMIARTHROPLASTY WITH POSTERIOR INTEROSSOUS NERVE NEURECTOMY AND REPAIR;  Surgeon: Roseanne Kaufman, MD;  Location: [place];  Service: Orthopedics;  Laterality: Right;  2.5 hrs Block with IV Sedation   convergent afib abaltion [facility] 02/26/15  02/26/15   [facility] by Dr. Lehman Prom and Dr. Danise Mina   ELECTROPHYSIOLOGIC STUDY N/A 11/24/2014   Procedure: Cardioversion;  Surgeon: Will Meredith Leeds, MD;  Location: [place] CV LAB;  Service: Cardiovascular;  Laterality: N/A;   ESOPHAGOGASTRODUODENOSCOPY N/A 04/09/2013   Procedure: ESOPHAGOGASTRODUODENOSCOPY (EGD) with possible Balloon Dilation;  Surgeon: Garlan Fair, MD;  Location: [facility] ENDOSCOPY;  Service: Endoscopy;  Laterality: N/A;   kidney stone removal     multiple   KNEE ARTHROSCOPY Bilateral 1979   LAPAROSCOPIC RETROPUBIC PROSTATECTOMY  02/2007   hx prostate cancer   ORIF ANKLE FRACTURE Right 10/2015   ORIF ANKLE FRACTURE Right 11/03/2015   Procedure: ORIF RIGHT LISFRANK FOOT;  Surgeon: Newt Minion, MD;  Location: [place];   Service: Orthopedics;  Laterality: Right;   TEE WITHOUT CARDIOVERSION  08/25/2011   Procedure: TRANSESOPHAGEAL ECHOCARDIOGRAM (TEE);  Surgeon: Jettie Booze, MD;  Location: [place];  Service: Cardiovascular;  Laterality: N/A;   TEE WITHOUT CARDIOVERSION  11/09/2011   Procedure: TRANSESOPHAGEAL ECHOCARDIOGRAM (TEE);  Surgeon: Jettie Booze, MD;  Location: [place];  Service: Cardiovascular;  Laterality: N/A;  h/p in file drawer   TEE WITHOUT CARDIOVERSION N/A 02/05/2013   Procedure: TRANSESOPHAGEAL ECHOCARDIOGRAM (TEE);  Surgeon: Candee Furbish, MD;  Location: [facility] ENDOSCOPY;  Service: Cardiovascular;  Laterality: N/A;   TEE WITHOUT CARDIOVERSION N/A 03/25/2020   Procedure: TRANSESOPHAGEAL ECHOCARDIOGRAM (TEE);  Surgeon: Skeet Latch, MD;  Location: [place];  Service: Cardiovascular;  Laterality: N/A;   TOTAL THYROIDECTOMY  2009   benign nodules removed   WRIST SURGERY Right 1977   with placement  of lunate prosthesis     Current Outpatient Medications  Medication Sig Dispense Refill   acetaminophen (TYLENOL) 500 MG tablet Take 1,000 mg by mouth every 6 (six) hours as needed for moderate pain or headache.     ALPRAZolam (XANAX) 1 MG tablet Take 1 tablet (1 mg total) by mouth 3 (three) times daily as needed for anxiety. (Patient taking differently: Take 1 mg by mouth daily as needed for anxiety.) 30 tablet 0   bismuth subsalicylate (PEPTO BISMOL) 262 MG/15ML suspension Take 30 mLs by mouth every 6 (six) hours as needed for indigestion or diarrhea or loose stools.     carvedilol (COREG) 12.5 MG tablet Take 3 tablets to equal 37.'5mg'$  twice daily 270 tablet 3   DULoxetine (CYMBALTA) 60 MG capsule Take 60 mg by mouth daily.     esomeprazole (NEXIUM) 40 MG capsule Take 40 mg by mouth daily.      FOLIC ACID PO Take 2,229 mcg by mouth daily.     Glucosamine HCl 1500 MG TABS Take 1,500 mg by mouth in the morning and at bedtime.     HYDROmorphone (DILAUDID) 2 MG tablet Take 1  tablet (2 mg total) by mouth every 4 (four) hours as needed for severe pain. 10 tablet 0   JARDIANCE 25 MG TABS tablet Take 25 mg by mouth daily.     levothyroxine (SYNTHROID) 175 MCG tablet Take 175 mcg by mouth daily.     losartan (COZAAR) 50 MG tablet Take 50 mg by mouth every evening.     LYSINE PO Take 1 tablet by mouth daily as needed (cold sores).     Meclizine HCl (BONINE PO) Take 1 tablet by mouth daily as needed (nausea).     metFORMIN (GLUCOPHAGE-XR) 500 MG 24 hr tablet Take 1,000 mg by mouth 2 (two) times daily.     MULTAQ 400 MG tablet TAKE 1 TABLET TWICE DAILY  WITH MEALS (Patient taking differently: Take 400 mg by mouth 2 (two) times daily with a meal.) 180 tablet 1   Multiple Vitamin (MULTIVITAMIN WITH MINERALS) TABS tablet Take 1 tablet by mouth daily. One-A-Day for Men     Multiple Vitamins-Minerals (PRESERVISION AREDS 2) CAPS Take 1 capsule by mouth 2 (two) times daily.     mupirocin ointment (BACTROBAN) 2 % Apply 1 application topically 2 (two) times daily as needed (skin wounds).     ondansetron (ZOFRAN) 4 MG tablet Take 4 mg by mouth 2 (two) times daily as needed for nausea.      ONETOUCH VERIO test strip 1 each daily.     polycarbophil (FIBERCON) 625 MG tablet Take 1,250 mg by mouth every evening.     Probiotic Product (PROBIOTIC PO) Take 1 capsule by mouth at bedtime.     rosuvastatin (CRESTOR) 10 MG tablet Take 10 mg by mouth at bedtime.     tamsulosin (FLOMAX) 0.4 MG CAPS capsule Take 0.4 mg by mouth daily as needed (kidney stones).      TRULICITY 7.98 XQ/1.1HE SOPN Inject 0.75 mg into the skin once a week. Thursday     vitamin B-12 (CYANOCOBALAMIN) 1000 MCG tablet Take 1,000 mcg by mouth daily.     XARELTO 15 MG TABS tablet Take 15 mg by mouth daily.     potassium chloride SA (KLOR-CON) 20 MEQ tablet Take 40 mEq by mouth 2 (two) times daily. (Patient not taking: Reported on 09/05/2021)     rivaroxaban (XARELTO) 20 MG TABS tablet TAKE 1 TABLET DAILY WITH  SUPPER  (Patient not taking: Reported on 09/05/2021) 90 tablet 3   Current Facility-Administered Medications  Medication Dose Route Frequency Provider Last Rate Last Admin   0.9 %  sodium chloride infusion  500 mL Intravenous Once Pyrtle, Lajuan Lines, MD        Allergies:   Thayer Jew hcl], Calcium channel blockers, Phenergan [promethazine hcl], Beta adrenergic blockers, Cardizem [diltiazem], Norvasc [amlodipine], Promethazine, Aspirin, Nsaids, and Prednisone    Social History:  The patient  reports that he has never smoked. He has never used smokeless tobacco. He reports that he does not drink alcohol and does not use drugs.   Family History:  The patient's family history includes Atrial fibrillation in his brother; Colon polyps in his brother, sister, and sister; Diabetes in his brother; Emphysema in his mother; Lung cancer in his father and sister; Throat cancer in his sister.    ROS:  Please see the history of present illness.   Otherwise, review of systems are positive for none.   All other systems are reviewed and negative.    PHYSICAL EXAM: VS:  BP 118/80 (BP Location: Right Arm, Patient Position: Sitting, Cuff Size: Large)   Pulse 69   Ht '5\' 7"'$  (1.702 m)   Wt 225 lb 8 oz (102.3 kg)   BMI 35.32 kg/m  , BMI Body mass index is 35.32 kg/m. GENERAL:  Well appearing HEENT: Pupils equal round and reactive, fundi not visualized, oral mucosa unremarkable NECK:  No jugular venous distention, waveform within normal limits, carotid upstroke brisk and symmetric, no bruits, no thyromegaly LUNGS:  Clear to auscultation bilaterally HEART:  RRR.  PMI not displaced or sustained,S1 and S2 within normal limits, no S3, no S4, no clicks, no rubs, no murmurs ABD:  Flat, positive bowel sounds normal in frequency in pitch, no bruits, no rebound, no guarding, no midline pulsatile mass, no hepatomegaly, no splenomegaly EXT:  2 plus pulses throughout, no edema, no cyanosis no clubbing SKIN:  No rashes no  nodules NEURO:  Cranial nerves II through XII grossly intact, motor grossly intact throughout PSYCH:  Cognitively intact, oriented to person place and time  EKG:  EKG is ordered today. 09/08/2021: Sinus rhythm.  Rate 69 bpm.  LVH.  TEE 03/25/20: 1. Left ventricular ejection fraction, by estimation, is 60 to 65%. The  left ventricle has normal function. The left ventricle has no regional  wall motion abnormalities.   2. Right ventricular systolic function is normal. The right ventricular  size is normal.   3. No left atrial/left atrial appendage thrombus was detected. The LAA  emptying velocity was 86 cm/s.   4. The mitral valve is normal in structure. Mild mitral valve  regurgitation. No evidence of mitral stenosis.   5. The aortic valve is tricuspid. Aortic valve regurgitation is not  visualized. No aortic stenosis is present.   6. Aortic dilatation noted. There is mild dilatation of the ascending  aorta, measuring 38 mm.   7. The inferior vena cava is normal in size with greater than 50%  respiratory variability, suggesting right atrial pressure of 3 mmHg.  Recent Labs: 08/09/2021: ALT 35 09/05/2021: BUN 19; Creatinine, Ser 1.78; Hemoglobin 11.8; Platelets 202; Potassium 3.8; Sodium 139    Lipid Panel    Component Value Date/Time   CHOL 181 07/24/2013 0530   TRIG 99 07/24/2013 0530   HDL 52 07/24/2013 0530   CHOLHDL 3.5 07/24/2013 0530   VLDL 20 07/24/2013 0530   LDLCALC 109 (H) 07/24/2013 0530  Wt Readings from Last 3 Encounters:  09/08/21 225 lb 8 oz (102.3 kg)  08/25/20 210 lb (95.3 kg)  07/10/20 210 lb (95.3 kg)      ASSESSMENT AND PLAN:  #Persistent atrial fibirllation:  # Atrial flutter: Mr. Sinkfield is maintaining sinus rhythm after cardioversion 05/2020.  Continue carvedilol, dronedarone, and Xarelto.  Xarelto was renally dosed due to AKI.  We will repeat a BMP in 2 weeks to see if he needs to remain on the 50 mg dose.  # Hypertension:  Blood pressure well  have been controlled on carvedilol and losartan.  If he has another hypotensive episode in the setting of a UTI recommended that he hold his losartan.  # Hyperlipidemia:  Continue rosuvastatin.  Dr. Philip Aspen checks his lipids.  # TIA:  Continue rosuvastatin and Xarelto.  Current medicines are reviewed at length with the patient today.  The patient does not have concerns regarding medicines.  The following changes have been made:  no change  Labs/ tests ordered today include:   Orders Placed This Encounter  Procedures   Basic metabolic panel   EKG [id number]     Disposition:   FU with Meredyth Hornung C. Oval Linsey, MD, [facility] in 6 months     Signed, Sevag Shearn C. Oval Linsey, MD, [facility]  09/08/2021 9:24 AM    [facility]

## 2021-09-09 DIAGNOSIS — N211 Calculus in urethra: Secondary | ICD-10-CM | POA: Diagnosis not present

## 2021-09-09 DIAGNOSIS — C44612 Basal cell carcinoma of skin of right upper limb, including shoulder: Secondary | ICD-10-CM | POA: Diagnosis not present

## 2021-09-09 DIAGNOSIS — L821 Other seborrheic keratosis: Secondary | ICD-10-CM | POA: Diagnosis not present

## 2021-09-09 DIAGNOSIS — D485 Neoplasm of uncertain behavior of skin: Secondary | ICD-10-CM | POA: Diagnosis not present

## 2021-09-09 DIAGNOSIS — Z85820 Personal history of malignant melanoma of skin: Secondary | ICD-10-CM | POA: Diagnosis not present

## 2021-09-09 DIAGNOSIS — C44519 Basal cell carcinoma of skin of other part of trunk: Secondary | ICD-10-CM | POA: Diagnosis not present

## 2021-09-09 DIAGNOSIS — Z85828 Personal history of other malignant neoplasm of skin: Secondary | ICD-10-CM | POA: Diagnosis not present

## 2021-09-09 DIAGNOSIS — L814 Other melanin hyperpigmentation: Secondary | ICD-10-CM | POA: Diagnosis not present

## 2021-09-13 ENCOUNTER — Other Ambulatory Visit: Payer: Self-pay | Admitting: Interventional Cardiology

## 2021-09-14 NOTE — Telephone Encounter (Signed)
Rx request sent to pharmacy.  

## 2021-09-17 DIAGNOSIS — Z8546 Personal history of malignant neoplasm of prostate: Secondary | ICD-10-CM | POA: Diagnosis not present

## 2021-09-17 DIAGNOSIS — R310 Gross hematuria: Secondary | ICD-10-CM | POA: Diagnosis not present

## 2021-09-17 DIAGNOSIS — N201 Calculus of ureter: Secondary | ICD-10-CM | POA: Diagnosis not present

## 2021-09-29 DIAGNOSIS — N202 Calculus of kidney with calculus of ureter: Secondary | ICD-10-CM | POA: Diagnosis not present

## 2021-10-04 DIAGNOSIS — N179 Acute kidney failure, unspecified: Secondary | ICD-10-CM | POA: Diagnosis not present

## 2021-10-04 DIAGNOSIS — Z5181 Encounter for therapeutic drug level monitoring: Secondary | ICD-10-CM | POA: Diagnosis not present

## 2021-10-05 LAB — BASIC METABOLIC PANEL
BUN/Creatinine Ratio: 14 (ref 10–24)
BUN: 23 mg/dL (ref 8–27)
CO2: 16 mmol/L — ABNORMAL LOW (ref 20–29)
Calcium: 9 mg/dL (ref 8.6–10.2)
Chloride: 110 mmol/L — ABNORMAL HIGH (ref 96–106)
Creatinine, Ser: 1.6 mg/dL — ABNORMAL HIGH (ref 0.76–1.27)
Glucose: 156 mg/dL — ABNORMAL HIGH (ref 70–99)
Potassium: 4 mmol/L (ref 3.5–5.2)
Sodium: 141 mmol/L (ref 134–144)
eGFR: 46 mL/min/{1.73_m2} — ABNORMAL LOW (ref >59)

## 2021-10-06 ENCOUNTER — Encounter: Payer: Self-pay | Admitting: Family

## 2021-10-06 ENCOUNTER — Ambulatory Visit (INDEPENDENT_AMBULATORY_CARE_PROVIDER_SITE_OTHER): Payer: Medicare Other | Admitting: Family

## 2021-10-06 DIAGNOSIS — M722 Plantar fascial fibromatosis: Secondary | ICD-10-CM | POA: Diagnosis not present

## 2021-10-06 MED ORDER — LIDOCAINE HCL 1 % IJ SOLN
2.0000 mL | INTRAMUSCULAR | Status: AC | PRN
  Administered 2021-10-06: 2 mL

## 2021-10-06 MED ORDER — METHYLPREDNISOLONE ACETATE 40 MG/ML IJ SUSP
40.0000 mg | INTRAMUSCULAR | Status: AC | PRN
  Administered 2021-10-06: 40 mg

## 2021-10-06 NOTE — Progress Notes (Signed)
Office Visit Note   Patient: [name] [name]           Date of Birth: 08/31/1950           MRN: [id number] Visit Date: 10/06/2021              Requested by: Donnajean Lopes, MD [place],  [place] [address] PCP: Donnajean Lopes, MD  No chief complaint on file.     HPI: The patient is a 71 year old gentleman who presents today for initial evaluation of left heel pain this has been gradually worsening over the last weeks to months.  He states it is worst with first morning waking as well as initial ambulation or standing from rest it gradually improves with activity he wonders if it is plantar fasciitis.  That he has recently gotten new supportive sketchers shoe wear does not feel that these have made any difference in his foot pain.  Assessment & Plan: Visit Diagnoses: No diagnosis found.  Plan: Depo-Medrol injection of the heel discussed conservative measures anti-inflammatories he states he will be obtaining some sole orthotics.  Discussed heel cord stretching.  Follow-Up Instructions: No follow-ups on file.   Ortho Exam  Patient is alert, oriented, no adenopathy, well-dressed, normal affect, normal respiratory effort. On examination of the left foot his foot is plantigrade there is no erythema no edema he is point tender to the origin of the plantar fascia.  No pain with lateral compression of the calcaneus  Imaging: No results found. No images are attached to the encounter.  Labs: Lab Results  Component Value Date   HGBA1C 6.7 (H) 10/31/2015   HGBA1C 7.2 (H) 07/23/2013   HGBA1C 6.9 (H) 03/05/2013   ESRSEDRATE 11 01/10/2011   REPTSTATUS 08/14/2021 FINAL 08/09/2021   GRAMSTAIN  02/25/2020    MODERATE WBC PRESENT, PREDOMINANTLY MONONUCLEAR NO ORGANISMS SEEN    CULT  08/09/2021    NO GROWTH 5 DAYS Performed at [facility], [place] [address] [place],  [address]      Lab Results  Component Value Date   ALBUMIN 3.5 08/09/2021    ALBUMIN 3.1 (L) 03/23/2020   ALBUMIN 4.2 03/18/2019    Lab Results  Component Value Date   MG 1.9 03/05/2013   MG 1.8 05/26/2008   MG 2.3 05/19/2008   No results found for: "VD25OH"  No results found for: "PREALBUMIN"    Latest Ref Rng & Units 09/05/2021   12:56 AM 08/09/2021   12:54 PM 05/26/2020    3:46 PM  CBC EXTENDED  WBC 4.0 - 10.5 K/uL 8.9  5.9  7.5   RBC 4.22 - 5.81 MIL/uL 4.65  4.56  5.01   Hemoglobin 13.0 - 17.0 g/dL 11.8  12.0  13.8   HCT 39.0 - 52.0 % 38.0  38.6  45.4   Platelets 150 - 400 K/uL 202  160  225   NEUT# 1.7 - 7.7 K/uL 6.0  4.1    Lymph# 0.7 - 4.0 K/uL 1.5  1.0       There is no height or weight on file to calculate BMI.  Orders:  No orders of the defined types were placed in this encounter.  No orders of the defined types were placed in this encounter.    Procedures: Foot Inj  Date/Time: 10/06/2021 4:26 PM  Performed by: Suzan Slick, NP Authorized by: Suzan Slick, NP   Consent Given by:  Patient Site marked: the procedure site was  marked   Timeout: prior to procedure the correct patient, procedure, and site was verified   Indications:  Fasciitis and pain Condition: Plantar Fasciitis   Location: left plantar fascia muscle   Prep: patient was prepped and draped in usual sterile fashion   Needle Size:  22 G Medications:  2 mL lidocaine 1 %; 40 mg methylPREDNISolone acetate 40 MG/ML Patient Tolerance:  Patient tolerated the procedure well with no immediate complications   Clinical Data: No additional findings.  ROS:  All other systems negative, except as noted in the HPI. Review of Systems  Objective: Vital Signs: There were no vitals taken for this visit.  Specialty Comments:  No specialty comments available.  PMFS History: Patient Active Problem List   Diagnosis Date Noted   Arthritis of right wrist 02/25/2020   Diabetic polyneuropathy associated with type 2 diabetes mellitus ([place]) 03/17/2016   Hyperlipidemia  12/21/2015   Foot fracture, right 10/31/2015   S/P ablation of atrial flutter-2013 and Oct 2014 11/24/2014   Dyslipidemia 11/24/2014   Chronic anticoagulation-Xarelto 11/24/2014   Type 2 diabetes mellitus with stage 3 chronic kidney disease ([place]) 11/24/2014   Hypothyroidism 11/24/2014   Atrial fibrillation with RVR ([place]) 11/24/2014   AKI (acute kidney injury) ([place])    Syncope 11/22/2014   PAF- DCCV Nov 2014- Amiodarone 11/22/2014   Orthostatic hypotension 11/22/2014   Hyperkalemia 11/22/2014   Polycythemia 11/22/2014   Atrial flutter ([place])    Acute bronchitis 08/08/2013   TIA (transient ischemic attack) 07/23/2013   Headache 07/23/2013   Acute on chronic combined systolic and diastolic congestive heart failure, NYHA class 1 ([place]) 03/06/2013   CKD (chronic kidney disease), stage III ([place]) 03/06/2013   CHF exacerbation ([place]) 03/04/2013   Flash pulmonary edema ([place]) 03/04/2013   Obstructive sleep apnea-C-pap intol 10/24/2011   Hypertension 10/24/2011   Obesity 10/24/2011   Past Medical History:  Diagnosis Date   Arthritis    OA   Atrial flutter ([place])    a. s/p RFCA 8/13   Cancer ([place]) 2010   prostate   Chronic combined systolic and diastolic CHF (congestive heart failure) ([place])    a. LVEF previously 35% felt to be due tachycardia;  b. 02/2013 Echo: EF 50-55%, no rwma, mildly dil LA/RA, mild to mod MR. C 11/2014 echo - ef 60-65%, unable to determine DD   CKD (chronic kidney disease), stage III (H[facility])    hx of a/c renal failure during episode of diverticulitis 9/13 in [place], [place]   Diabetes mellitus ([place])    TYPE 2    Diverticulitis    Diverticulosis    Dyslipidemia    Dysrhythmia    Erectile dysfunction    Gastric ulcer    s/p prior surgery   GERD (gastroesophageal reflux disease)    Headache    History of colonic diverticulitis    History of prostate cancer    Hypertension    Hypothyroid    Microcytic anemia    Obesities, morbid ([place])    Obstructive sleep apnea     noncompliant with CPAP   Osteoporosis    PAF (paroxysmal atrial fibrillation) ([place])    a. failed DCCV and multiple anti-arrhythmic drugs (multaq/flecainide);   b. s/p  PVI isolation with ablation of AFib 8/13; c. repeat PVI 01/2013;  d. 02/2013 repeat DCCV->Amio load/xarelto.   Peptic ulcer disease 1994   Renal calculi    Sleep apnea 2015   has not had sleep apnea since heart intervention   Thrombocytopenia ([place]) 1992  idopathic-treated with danazol   Tubular adenoma of colon     Family History  Problem Relation Age of Onset   Emphysema Mother    Lung cancer Father    Diabetes Brother    Atrial fibrillation Brother    Colon polyps Brother    Throat cancer Sister    Colon polyps Sister    Lung cancer Sister    Colon polyps Sister    Colon cancer Neg Hx    Esophageal cancer Neg Hx    Rectal cancer Neg Hx    Stomach cancer Neg Hx     Past Surgical History:  Procedure Laterality Date   ATRIAL FIBRILLATION ABLATION  12/06/11; 02/05/2013   Afib and atrial flutter ablation by Dr Rayann Heman; repeat PVI by Dr Rayann Heman 02/05/2013   ATRIAL FIBRILLATION ABLATION N/A 12/06/2011   Procedure: ATRIAL FIBRILLATION ABLATION;  Surgeon: Thompson Grayer, MD;  Location: [facility] CATH LAB;  Service: Cardiovascular;  Laterality: N/A;   ATRIAL FIBRILLATION ABLATION N/A 02/05/2013   Procedure: ATRIAL FIBRILLATION ABLATION;  Surgeon: Coralyn Mark, MD;  Location: [facility] CATH LAB;  Service: Cardiovascular;  Laterality: N/A;   benign stomach tumor removal  2000   CARDIOVERSION  08/25/2011   Procedure: CARDIOVERSION;  Surgeon: Jettie Booze, MD;  Location: [place];  Service: Cardiovascular;  Laterality: N/A;   CARDIOVERSION N/A 03/05/2013   Procedure: CARDIOVERSION;  Surgeon: Sinclair Grooms, MD;  Location: [place];  Service: Cardiovascular;  Laterality: N/A;  BEDSIDE    CARDIOVERSION N/A 03/25/2020   Procedure: CARDIOVERSION;  Surgeon: Skeet Latch, MD;  Location: [place];  Service: Cardiovascular;   Laterality: N/A;   CARDIOVERSION N/A 06/02/2020   Procedure: CARDIOVERSION;  Surgeon: Pixie Casino, MD;  Location: [facility] ENDOSCOPY;  Service: Cardiovascular;  Laterality: N/A;   [place] Right 02/25/2020   Procedure: RIGHT CARPAL TUNNEL RELEASE, CUBITAL TUNNEL RELEASE IN SITU, RIGHT WRIST PROXIMAL ROW CARPECTOMY AND PROXIMAL CAPITATE HEAD REPLACEMENT/HEMIARTHROPLASTY WITH POSTERIOR INTEROSSOUS NERVE NEURECTOMY AND REPAIR;  Surgeon: Roseanne Kaufman, MD;  Location: [place];  Service: Orthopedics;  Laterality: Right;  2.5 hrs Block with IV Sedation   convergent afib abaltion [facility] 02/26/15  02/26/15   [facility] by Dr. Lehman Prom and Dr. Danise Mina   ELECTROPHYSIOLOGIC STUDY N/A 11/24/2014   Procedure: Cardioversion;  Surgeon: Will Meredith Leeds, MD;  Location: [place] CV LAB;  Service: Cardiovascular;  Laterality: N/A;   ESOPHAGOGASTRODUODENOSCOPY N/A 04/09/2013   Procedure: ESOPHAGOGASTRODUODENOSCOPY (EGD) with possible Balloon Dilation;  Surgeon: Garlan Fair, MD;  Location: [facility] ENDOSCOPY;  Service: Endoscopy;  Laterality: N/A;   kidney stone removal     multiple   KNEE ARTHROSCOPY Bilateral 1979   LAPAROSCOPIC RETROPUBIC PROSTATECTOMY  02/2007   hx prostate cancer   ORIF ANKLE FRACTURE Right 10/2015   ORIF ANKLE FRACTURE Right 11/03/2015   Procedure: ORIF RIGHT LISFRANK FOOT;  Surgeon: Newt Minion, MD;  Location: [place];  Service: Orthopedics;  Laterality: Right;   TEE WITHOUT CARDIOVERSION  08/25/2011   Procedure: TRANSESOPHAGEAL ECHOCARDIOGRAM (TEE);  Surgeon: Jettie Booze, MD;  Location: [place];  Service: Cardiovascular;  Laterality: N/A;   TEE WITHOUT CARDIOVERSION  11/09/2011   Procedure: TRANSESOPHAGEAL ECHOCARDIOGRAM (TEE);  Surgeon: Jettie Booze, MD;  Location: [place];  Service: Cardiovascular;  Laterality: N/A;  h/p in file drawer   TEE WITHOUT CARDIOVERSION N/A 02/05/2013   Procedure: TRANSESOPHAGEAL ECHOCARDIOGRAM (TEE);  Surgeon: Candee Furbish, MD;  Location: [place];  Service: Cardiovascular;  Laterality: N/A;  TEE WITHOUT CARDIOVERSION N/A 03/25/2020   Procedure: TRANSESOPHAGEAL ECHOCARDIOGRAM (TEE);  Surgeon: Skeet Latch, MD;  Location: [place];  Service: Cardiovascular;  Laterality: N/A;   TOTAL THYROIDECTOMY  2009   benign nodules removed   WRIST SURGERY Right 1977   with placement of lunate prosthesis   Social History   Occupational History   Not on file  Tobacco Use   Smoking status: Never   Smokeless tobacco: Never  Vaping Use   Vaping Use: Never used  Substance and Sexual Activity   Alcohol use: No   Drug use: No   Sexual activity: Yes

## 2021-11-11 DIAGNOSIS — E782 Mixed hyperlipidemia: Secondary | ICD-10-CM | POA: Diagnosis not present

## 2021-11-11 DIAGNOSIS — E669 Obesity, unspecified: Secondary | ICD-10-CM | POA: Diagnosis not present

## 2021-11-11 DIAGNOSIS — I129 Hypertensive chronic kidney disease with stage 1 through stage 4 chronic kidney disease, or unspecified chronic kidney disease: Secondary | ICD-10-CM | POA: Diagnosis not present

## 2021-11-11 DIAGNOSIS — N1831 Chronic kidney disease, stage 3a: Secondary | ICD-10-CM | POA: Diagnosis not present

## 2021-11-11 DIAGNOSIS — E1121 Type 2 diabetes mellitus with diabetic nephropathy: Secondary | ICD-10-CM | POA: Diagnosis not present

## 2021-11-21 ENCOUNTER — Other Ambulatory Visit (HOSPITAL_BASED_OUTPATIENT_CLINIC_OR_DEPARTMENT_OTHER): Payer: Self-pay | Admitting: Cardiovascular Disease

## 2021-11-22 NOTE — Telephone Encounter (Signed)
Rx(s) sent to pharmacy electronically.  

## 2021-12-10 DIAGNOSIS — D509 Iron deficiency anemia, unspecified: Secondary | ICD-10-CM | POA: Diagnosis not present

## 2021-12-10 DIAGNOSIS — M722 Plantar fascial fibromatosis: Secondary | ICD-10-CM | POA: Diagnosis not present

## 2021-12-10 DIAGNOSIS — R42 Dizziness and giddiness: Secondary | ICD-10-CM | POA: Diagnosis not present

## 2021-12-10 DIAGNOSIS — N1831 Chronic kidney disease, stage 3a: Secondary | ICD-10-CM | POA: Diagnosis not present

## 2021-12-10 DIAGNOSIS — E782 Mixed hyperlipidemia: Secondary | ICD-10-CM | POA: Diagnosis not present

## 2021-12-10 DIAGNOSIS — I129 Hypertensive chronic kidney disease with stage 1 through stage 4 chronic kidney disease, or unspecified chronic kidney disease: Secondary | ICD-10-CM | POA: Diagnosis not present

## 2021-12-10 DIAGNOSIS — D649 Anemia, unspecified: Secondary | ICD-10-CM | POA: Diagnosis not present

## 2021-12-10 DIAGNOSIS — I10 Essential (primary) hypertension: Secondary | ICD-10-CM | POA: Diagnosis not present

## 2021-12-10 DIAGNOSIS — E1121 Type 2 diabetes mellitus with diabetic nephropathy: Secondary | ICD-10-CM | POA: Diagnosis not present

## 2021-12-16 ENCOUNTER — Telehealth (HOSPITAL_BASED_OUTPATIENT_CLINIC_OR_DEPARTMENT_OTHER): Payer: Self-pay

## 2021-12-16 NOTE — Telephone Encounter (Signed)
Left message letting patient know his Multaq is ready for pick up at the front desk.

## 2022-01-02 DIAGNOSIS — Z23 Encounter for immunization: Secondary | ICD-10-CM | POA: Diagnosis not present

## 2022-01-04 NOTE — Progress Notes (Unsigned)
01/04/2022 [name] [name] [id number] 19-Feb-1951   Chief Complaint: Anemia   History of Present Illness: [name] [name]. [name] [name] is a 71 year old male with a past medical history of hypertension, hyperlipidemia, atrial fibrillation s/p ablation x 2 cardioversion 05/2020 on Xarelo, chronic diastolic CHF, TIA 9169, diabetes mellitus type 2, CKD stage III, sleep apnea uses a mouth guard, prostate cancer, GERD, diverticulitis and colon polyps.  He is followed by Dr. Hilarie Fredrickson. He presents today for further evaluation regarding iron deficiency anemia.  He denies having any dysphagia or heartburn.  No upper or lower abdominal pain.  He is passing normal formed brown bowel movement daily.  No rectal bleeding or black stools.  No weight loss.  No fever, sweats or chills.  He complains of having fatigue with shortness of breath with exertion which started while he and his wife were traveling in [place] July 2023.  No chest pain, palpitations or dizziness.  No NSAID use.  No alcohol use.  He started taking oral iron 12/10/2021.  He remains on Xarelto for history of A-fib.  His most recent colonoscopy was 08/25/2020 and 8 tubular adenomatous polyps were removed from the colon.  He was advised to repeat a colonoscopy in 3 years.  The bowel prep was good.  He denies any history of GI bleed.  Remote history of a gastric ulcer.  EGD 04/09/2013 by Dr. Wynetta Emery was reported as normal.  His wife stated he had an EGD 25+ years ago which identified a benign stomach polyp or mass which was removed. GERD is well controlled on Esomeprazole 40 mg once daily.  Labs 12/10/2021: Hemoglobin 10.9.  Hematocrit 33.2.  MCV 75.7.  Platelet 213.  BUN 20.  Creatinine 1.40 (0.3 -1.5).  Iron 37.  Iron saturation 10%.  TIBC 379.  Labs 09/05/2021: Hemoglobin 11.8 (Hg 12 on 08/09/2021).  Hematocrit 38.0.  MCV 81.7.  RDW 17.6.  Platelet 202.  BUN 19.  Creatinine 1.78 (Cr 2.17 on 08/09/2021).  Labs 08/09/2021: Total bili 0.8.  Alk phos 68.  AST 42.  ALT  35.  Colonoscopy Dr. Hilarie Fredrickson 08/25/2020: - One 4 mm polyp in the cecum, removed with a cold snare. Resected and retrieved. - Two 3 to 5 mm polyps in the ascending colon, removed with a cold snare. Resected and retrieved. - Two 4 to 6 mm polyps in the transverse colon, removed with a cold snare. Resected and - Three 3 to 5 mm polyps in the descending colon, removed with a cold snare. Resected and retrieved. - Diverticulosis in the sigmoid colon, in the descending colon and in the ascending colon. - Small internal hemorrhoids. - 3 Year colonoscopy recall 1. Surgical [P], colon, ascending and cecum, polyp (3) - TUBULAR ADENOMA(S). - NO HIGH GRADE DYSPLASIA OR CARCINOMA. 2. Surgical [P], colon, transverse, polyp (2) - TUBULAR ADENOMA(S). - NO HIGH GRADE DYSPLASIA OR CARCINOMA. 3. Surgical [P], colon, descending, polyp (3) - TUBULAR ADENOMA(S). - NO HIGH GRADE DYSPLASIA OR CARCINOMA.  Colonoscopy by Dr. Earle Gell with [facility] 05/01/2012: The anal and digital rectal examination were normal A sessile polyp was found in the cecum.  The polyp was 5 mm in size.  The polyp was removed with a cold snare.  Resection and retrieval were complete. A sessile polyp was found in the distal descending colon.  The polyp was 3 mm in size.  Polyp was removed with a cold biopsy forceps.  Resection and retrieval were complete.  Multiple small and large mouth  diverticula were found in the sigmoid colon. The exam was otherwise without abnormality.  EGD 04/09/2013: Reported as normal.  Past Medical History:  Diagnosis Date   Arthritis    OA   Atrial flutter ([place])    a. s/p RFCA 8/13   Cancer ([place]) 2010   prostate   Chronic combined systolic and diastolic CHF (congestive heart failure) ([place])    a. LVEF previously 35% felt to be due tachycardia;  b. 02/2013 Echo: EF 50-55%, no rwma, mildly dil LA/RA, mild to mod MR. C 11/2014 echo - ef 60-65%, unable to determine DD   CKD (chronic kidney disease), stage  III (HCC)    hx of a/c renal failure during episode of diverticulitis 9/13 in [place], [place]   Diabetes mellitus ([place])    TYPE 2    Diverticulitis    Diverticulosis    Dyslipidemia    Dysrhythmia    Erectile dysfunction    Gastric ulcer    s/p prior surgery   GERD (gastroesophageal reflux disease)    Headache    History of colonic diverticulitis    History of prostate cancer    Hypertension    Hypothyroid    Kidney stones    Microcytic anemia    Obesities, morbid ([place])    Obstructive sleep apnea    noncompliant with CPAP   Osteoporosis    PAF (paroxysmal atrial fibrillation) ([place])    a. failed DCCV and multiple anti-arrhythmic drugs (multaq/flecainide);   b. s/p  PVI isolation with ablation of AFib 8/13; c. repeat PVI 01/2013;  d. 02/2013 repeat DCCV->Amio load/xarelto.   Peptic ulcer disease 1994   Renal calculi    Sleep apnea 2015   has not had sleep apnea since heart intervention   Thrombocytopenia ([place]) 1992   idopathic-treated with danazol   Tubular adenoma of colon    Past Surgical History:  Procedure Laterality Date   ATRIAL FIBRILLATION ABLATION  12/06/11; 02/05/2013   Afib and atrial flutter ablation by Dr Rayann Heman; repeat PVI by Dr Rayann Heman 02/05/2013   ATRIAL FIBRILLATION ABLATION N/A 12/06/2011   Procedure: ATRIAL FIBRILLATION ABLATION;  Surgeon: Thompson Grayer, MD;  Location: [facility] CATH LAB;  Service: Cardiovascular;  Laterality: N/A;   ATRIAL FIBRILLATION ABLATION N/A 02/05/2013   Procedure: ATRIAL FIBRILLATION ABLATION;  Surgeon: Coralyn Mark, MD;  Location: [facility] CATH LAB;  Service: Cardiovascular;  Laterality: N/A;   benign stomach tumor removal  2000   CARDIOVERSION  08/25/2011   Procedure: CARDIOVERSION;  Surgeon: Jettie Booze, MD;  Location: [place];  Service: Cardiovascular;  Laterality: N/A;   CARDIOVERSION N/A 03/05/2013   Procedure: CARDIOVERSION;  Surgeon: Sinclair Grooms, MD;  Location: [facility];  Service: Cardiovascular;  Laterality: N/A;   BEDSIDE    CARDIOVERSION N/A 03/25/2020   Procedure: CARDIOVERSION;  Surgeon: Skeet Latch, MD;  Location: [place];  Service: Cardiovascular;  Laterality: N/A;   CARDIOVERSION N/A 06/02/2020   Procedure: CARDIOVERSION;  Surgeon: Pixie Casino, MD;  Location: [facility] ENDOSCOPY;  Service: Cardiovascular;  Laterality: N/A;   [place] Right 02/25/2020   Procedure: RIGHT CARPAL TUNNEL RELEASE, CUBITAL TUNNEL RELEASE IN SITU, RIGHT WRIST PROXIMAL ROW CARPECTOMY AND PROXIMAL CAPITATE HEAD REPLACEMENT/HEMIARTHROPLASTY WITH POSTERIOR INTEROSSOUS NERVE NEURECTOMY AND REPAIR;  Surgeon: Roseanne Kaufman, MD;  Location: [place];  Service: Orthopedics;  Laterality: Right;  2.5 hrs Block with IV Sedation   convergent afib abaltion [facility] 02/26/15  02/26/15   [facility] by Dr. Lehman Prom and Dr. Danise Mina  ELECTROPHYSIOLOGIC STUDY N/A 11/24/2014   Procedure: Cardioversion;  Surgeon: Will Meredith Leeds, MD;  Location: [facility] CV LAB;  Service: Cardiovascular;  Laterality: N/A;   ESOPHAGOGASTRODUODENOSCOPY N/A 04/09/2013   Procedure: ESOPHAGOGASTRODUODENOSCOPY (EGD) with possible Balloon Dilation;  Surgeon: Garlan Fair, MD;  Location: [facility] ENDOSCOPY;  Service: Endoscopy;  Laterality: N/A;   kidney stone removal     multiple   KNEE ARTHROSCOPY Bilateral 1979   LAPAROSCOPIC RETROPUBIC PROSTATECTOMY  02/2007   hx prostate cancer   ORIF ANKLE FRACTURE Right 10/2015   ORIF ANKLE FRACTURE Right 11/03/2015   Procedure: ORIF RIGHT LISFRANK FOOT;  Surgeon: Newt Minion, MD;  Location: [place];  Service: Orthopedics;  Laterality: Right;   TEE WITHOUT CARDIOVERSION  08/25/2011   Procedure: TRANSESOPHAGEAL ECHOCARDIOGRAM (TEE);  Surgeon: Jettie Booze, MD;  Location: [place];  Service: Cardiovascular;  Laterality: N/A;   TEE WITHOUT CARDIOVERSION  11/09/2011   Procedure: TRANSESOPHAGEAL ECHOCARDIOGRAM (TEE);  Surgeon: Jettie Booze, MD;  Location: [place];  Service:  Cardiovascular;  Laterality: N/A;  h/p in file drawer   TEE WITHOUT CARDIOVERSION N/A 02/05/2013   Procedure: TRANSESOPHAGEAL ECHOCARDIOGRAM (TEE);  Surgeon: Candee Furbish, MD;  Location: [facility] ENDOSCOPY;  Service: Cardiovascular;  Laterality: N/A;   TEE WITHOUT CARDIOVERSION N/A 03/25/2020   Procedure: TRANSESOPHAGEAL ECHOCARDIOGRAM (TEE);  Surgeon: Skeet Latch, MD;  Location: [place];  Service: Cardiovascular;  Laterality: N/A;   TOTAL THYROIDECTOMY  2009   benign nodules removed   WRIST SURGERY Right 1977   with placement of lunate prosthesis   Current Outpatient Medications on File Prior to Visit  Medication Sig Dispense Refill   acetaminophen (TYLENOL) 500 MG tablet Take 1,000 mg by mouth every 6 (six) hours as needed for moderate pain or headache.     ALPRAZolam (XANAX) 1 MG tablet Take 1 tablet (1 mg total) by mouth 3 (three) times daily as needed for anxiety. (Patient taking differently: Take 1 mg by mouth daily as needed for anxiety.) 30 tablet 0   bismuth subsalicylate (PEPTO BISMOL) 262 MG/15ML suspension Take 30 mLs by mouth every 6 (six) hours as needed for indigestion or diarrhea or loose stools.     carvedilol (COREG) 12.5 MG tablet TAKE 3 TABLETS BY MOUTH TWICE DAILY 540 tablet 1   DULoxetine (CYMBALTA) 60 MG capsule Take 60 mg by mouth daily.     esomeprazole (NEXIUM) 40 MG capsule Take 40 mg by mouth daily.      FOLIC ACID PO Take 9,371 mcg by mouth daily.     Glucosamine HCl 1500 MG TABS Take 1,500 mg by mouth in the morning and at bedtime.     HYDROmorphone (DILAUDID) 2 MG tablet Take 1 tablet (2 mg total) by mouth every 4 (four) hours as needed for severe pain. 10 tablet 0   JARDIANCE 25 MG TABS tablet Take 25 mg by mouth daily.     levothyroxine (SYNTHROID) 175 MCG tablet Take 175 mcg by mouth daily.     losartan (COZAAR) 50 MG tablet Take 50 mg by mouth every evening.     LYSINE PO Take 1 tablet by mouth daily as needed (cold sores).     Meclizine HCl (BONINE PO)  Take 1 tablet by mouth daily as needed (nausea).     metFORMIN (GLUCOPHAGE-XR) 500 MG 24 hr tablet Take 1,000 mg by mouth 2 (two) times daily.     MULTAQ 400 MG tablet TAKE 1 TABLET TWICE DAILY  WITH MEALS (Patient taking differently: Take 400  mg by mouth 2 (two) times daily with a meal.) 180 tablet 1   Multiple Vitamin (MULTIVITAMIN WITH MINERALS) TABS tablet Take 1 tablet by mouth daily. One-A-Day for Men     Multiple Vitamins-Minerals (PRESERVISION AREDS 2) CAPS Take 1 capsule by mouth 2 (two) times daily.     mupirocin ointment (BACTROBAN) 2 % Apply 1 application topically 2 (two) times daily as needed (skin wounds).     ondansetron (ZOFRAN) 4 MG tablet Take 4 mg by mouth 2 (two) times daily as needed for nausea.      ONETOUCH VERIO test strip 1 each daily.     polycarbophil (FIBERCON) 625 MG tablet Take 1,250 mg by mouth every evening.     Probiotic Product (PROBIOTIC PO) Take 1 capsule by mouth at bedtime.     rivaroxaban (XARELTO) 20 MG TABS tablet TAKE 1 TABLET DAILY WITH   SUPPER (Patient taking differently: 15 mg daily. TAKE 1 TABLET DAILY WITH   SUPPER) 90 tablet 3   rosuvastatin (CRESTOR) 10 MG tablet Take 1 tablet by mouth once daily 90 tablet 1   tamsulosin (FLOMAX) 0.4 MG CAPS capsule Take 0.4 mg by mouth daily as needed (kidney stones).      TRULICITY 7.59 FM/3.8GY SOPN Inject 0.75 mg into the skin once a week. Thursday     vitamin B-12 (CYANOCOBALAMIN) 1000 MCG tablet Take 1,000 mcg by mouth daily.     XARELTO 15 MG TABS tablet Take 15 mg by mouth daily.     potassium chloride SA (KLOR-CON) 20 MEQ tablet Take 40 mEq by mouth 2 (two) times daily. (Patient not taking: Reported on 01/05/2022)     Current Facility-Administered Medications on File Prior to Visit  Medication Dose Route Frequency Provider Last Rate Last Admin   0.9 %  sodium chloride infusion  500 mL Intravenous Once Pyrtle, Lajuan Lines, MD       Allergies  Allergen Reactions   Bystolic [Nebivolol Hcl] Swelling     Bradycardia    Calcium Channel Blockers Swelling    LE edema   Phenergan [Promethazine Hcl] Itching    Hallucination    Beta Adrenergic Blockers Swelling    bradycardia   Cardizem [Diltiazem] Other (See Comments)    Foot swelling and increased his heart rate   Norvasc [Amlodipine] Other (See Comments)   Promethazine Itching and Other (See Comments)   Aspirin Other (See Comments)    Hx of ulcer    Nsaids     Hx ulcer   Prednisone Other (See Comments)    Hx of ulcer    Current Medications, Allergies, Past Medical History, Past Surgical History, Family History and Social History were reviewed in [employer] record.  Review of Systems:   Constitutional: Negative for fever, sweats, chills or weight loss.  Respiratory: See HPI. Cardiovascular: Negative for chest pain, palpitations and leg swelling.  Gastrointestinal: See HPI.  Musculoskeletal: Negative for back pain or muscle aches.  Neurological: Negative for dizziness, headaches or paresthesias.   Physical Exam: BP 128/86   Pulse 60   Ht '5\' 7"'  (1.702 m)   Wt 224 lb (101.6 kg)   SpO2 95%   BMI 35.08 kg/m   General: 71 year old male in no acute distress. Head: Normocephalic and atraumatic. Eyes: No scleral icterus. Conjunctiva pink . Ears: Normal auditory acuity. Mouth: Dentition intact. No ulcers or lesions.  Lungs: Clear throughout to auscultation. Heart: Regular rate and rhythm, no murmur. Abdomen: Soft, nontender and nondistended. No masses or  hepatomegaly. Normal bowel sounds x 4 quadrants.  Tensive midline abdominal scar intact. Rectal: Deferred. Musculoskeletal: Symmetrical with no gross deformities. Extremities: No edema. Neurological: Alert oriented x 4. No focal deficits.  Psychological: Alert and cooperative. Normal mood and affect  Assessment and Recommendations:  65) 71 year old male with iron deficiency anemia.  Hemoglobin 10.9.  Iron 37.  Iron saturation 10%.  No overt GI  bleeding on Xarelto for A-fib.  On oral iron. -EGD to rule out UGI source for IDA, benefits and risks discussed including risk with sedation, risk of bleeding, perforation and infection  -He underwent a colonoscopy 08/2020 with a good prep which resulted in removing a tubular adenomatous polyps from the colon.  I will consult with Dr. Hilarie Fredrickson to determine if a diagnostic colonoscopy is warranted at this time. -Our office will contact cardiologist Dr. Skeet Latch to verify Xarelto hold instructions prior to proceeding with an EGD +/- colonoscopy   2) GERD, well controlled on Esomeprazole 73m QD. Normal EGD in 2014.   3) History of colon polyps.  8 tubular adenomatous polyps were removed from the colon per colonoscopy 08/25/2020. -Next colon polyp surveillance colonoscopy due 08/2023.  4) Atrial fibrillation on Xarelto  5) DM II

## 2022-01-05 ENCOUNTER — Telehealth: Payer: Self-pay

## 2022-01-05 ENCOUNTER — Ambulatory Visit (INDEPENDENT_AMBULATORY_CARE_PROVIDER_SITE_OTHER): Payer: Medicare Other | Admitting: Nurse Practitioner

## 2022-01-05 ENCOUNTER — Other Ambulatory Visit (INDEPENDENT_AMBULATORY_CARE_PROVIDER_SITE_OTHER): Payer: Medicare Other

## 2022-01-05 ENCOUNTER — Encounter: Payer: Self-pay | Admitting: Nurse Practitioner

## 2022-01-05 DIAGNOSIS — I4891 Unspecified atrial fibrillation: Secondary | ICD-10-CM

## 2022-01-05 DIAGNOSIS — D509 Iron deficiency anemia, unspecified: Secondary | ICD-10-CM

## 2022-01-05 LAB — CBC WITH DIFFERENTIAL/PLATELET
Basophils Absolute: 0.1 10*3/uL (ref 0.0–0.1)
Basophils Relative: 1 % (ref 0.0–3.0)
Eosinophils Absolute: 0.2 10*3/uL (ref 0.0–0.7)
Eosinophils Relative: 3.6 % (ref 0.0–5.0)
HCT: 36.3 % — ABNORMAL LOW (ref 39.0–52.0)
Hemoglobin: 11.6 g/dL — ABNORMAL LOW (ref 13.0–17.0)
Lymphocytes Relative: 19.2 % (ref 12.0–46.0)
Lymphs Abs: 1.2 10*3/uL (ref 0.7–4.0)
MCHC: 31.9 g/dL (ref 30.0–36.0)
MCV: 76.3 fl — ABNORMAL LOW (ref 78.0–100.0)
Monocytes Absolute: 0.8 10*3/uL (ref 0.1–1.0)
Monocytes Relative: 11.8 % (ref 3.0–12.0)
Neutro Abs: 4.1 10*3/uL (ref 1.4–7.7)
Neutrophils Relative %: 64.4 % (ref 43.0–77.0)
Platelets: 190 10*3/uL (ref 150.0–400.0)
RBC: 4.76 Mil/uL (ref 4.22–5.81)
RDW: 20.6 % — ABNORMAL HIGH (ref 11.5–15.5)
WBC: 6.4 10*3/uL (ref 4.0–10.5)

## 2022-01-05 LAB — IBC + FERRITIN
Ferritin: 12.3 ng/mL — ABNORMAL LOW (ref 22.0–322.0)
Iron: 40 ug/dL — ABNORMAL LOW (ref 42–165)
Saturation Ratios: 9.9 % — ABNORMAL LOW (ref 20.0–50.0)
TIBC: 404.6 ug/dL (ref 250.0–450.0)
Transferrin: 289 mg/dL (ref 212.0–360.0)

## 2022-01-05 LAB — BASIC METABOLIC PANEL
BUN: 18 mg/dL (ref 6–23)
CO2: 25 mEq/L (ref 19–32)
Calcium: 9.1 mg/dL (ref 8.4–10.5)
Chloride: 107 mEq/L (ref 96–112)
Creatinine, Ser: 1.24 mg/dL (ref 0.40–1.50)
GFR: 58.47 mL/min — ABNORMAL LOW (ref >60.00)
Glucose, Bld: 122 mg/dL — ABNORMAL HIGH (ref 70–99)
Potassium: 3.7 mEq/L (ref 3.5–5.1)
Sodium: 141 mEq/L (ref 135–145)

## 2022-01-05 NOTE — Patient Instructions (Addendum)
_______________________________________________________  If you are age 71 or older, your body mass index should be between 23-30. Your Body mass index is 35.08 kg/m. If this is out of the aforementioned range listed, please consider follow up with your Primary Care Provider. _______________________________________________________  The [facility] providers would like to encourage you to use [facility] to communicate with providers for non-urgent requests or questions.  Due to long hold times on the telephone, sending your provider a message by [facility] may be a faster and more efficient way to get a response.  Please allow 48 business hours for a response.  Please remember that this is for non-urgent requests.  _______________________________________________________  Your provider has requested that you go to the basement level for lab work before leaving today. Press "B" on the elevator. The lab is located at the first door on the left as you exit the elevator.  You have been scheduled for an endoscopy. Please follow written instructions given to you at your visit today. If you use inhalers (even only as needed), please bring them with you on the day of your procedure.  You will be contacted by our office prior to your procedure for directions on holding your Xarelto.  If you do not hear from our office 1 week prior to your scheduled procedure, please call [phone] to discuss.   Due to recent changes in healthcare laws, you may see the results of your imaging and laboratory studies on MyChart before your provider has had a chance to review them.  We understand that in some cases there may be results that are confusing or concerning to you. Not all laboratory results come back in the same time frame and the provider may be waiting for multiple results in order to interpret others.  Please give [place] 48 hours in order for your provider to thoroughly review all the results before contacting the office for  clarification of your results.   Go to the emergency room if you develop chest pain, worsening shortness of breath, dizziness, or profound weakness   Thank you for entrusting me with your care and choosing [facility].  Carl Best, NP

## 2022-01-05 NOTE — Telephone Encounter (Signed)
[facility] Pre-operative Risk Assessment     Request for surgical clearance:     Endoscopy Procedure  What type of surgery is being performed?     EGD  When is this surgery scheduled?     01-17-22  What type of clearance is required ?   Pharmacy  Are there any medications that need to be held prior to surgery and how long? Xarelto x 2 days  Practice name and name of physician performing surgery?      [facility]  What is your office phone and fax number?      Phone- [phone]  [phone]  Anesthesia type (None, local, MAC, general) ?       MAC

## 2022-01-06 NOTE — Telephone Encounter (Signed)
   Patient Name: [name] [name]  DOB: 16-Jun-1950 MRN: [id number]  Primary Cardiologist: None  Clinical pharmacists have reviewed the patient's past medical history, labs, and current medications as part of preoperative protocol coverage. The following recommendations have been made:   Patient with diagnosis of A Fib on Xarelto for anticoagulation.     Procedure: EGD Date of procedure: 01/17/22     CHA2DS2-VASc Score = 6  This indicates a 9.7% annual risk of stroke. The patient's score is based upon: CHF History: 1 HTN History: 1 Diabetes History: 1 Stroke History: 2 Vascular Disease History: 0 Age Score: 1 Gender Score: 0     CrCl 79 mL/min Platelet count 190K   Per office protocol, patient can hold Xarelto for 1 day prior to procedure. Please resume Xarelto as soon as possible postprocedure, at the discretion of the surgeon.   I will route this recommendation to the requesting party via Epic fax function and remove from pre-op pool.  Please call with questions.  Lenna Sciara, NP 01/06/2022, 3:28 PM

## 2022-01-06 NOTE — Progress Notes (Signed)
Addendum: Reviewed and agree with assessment and management plan. Essam Lowdermilk M, MD  

## 2022-01-06 NOTE — Telephone Encounter (Signed)
Patient with diagnosis of A Fib on Xarelto for anticoagulation.    Procedure: EGD Date of procedure: 01/17/22   CHA2DS2-VASc Score = 6  This indicates a 9.7% annual risk of stroke. The patient's score is based upon: CHF History: 1 HTN History: 1 Diabetes History: 1 Stroke History: 2 Vascular Disease History: 0 Age Score: 1 Gender Score: 0   CrCl 79 mL/min Platelet count 190K  Per office protocol, patient can hold Xarelto for 1 day prior to procedure.     **This guidance is not considered finalized until pre-operative APP has relayed final recommendations.**

## 2022-01-06 NOTE — Telephone Encounter (Signed)
Patient notified per Cardiology's recommendation to hold Xarelto 1 day prior to his procedure. Patient verbalized understanding.

## 2022-01-17 ENCOUNTER — Ambulatory Visit (AMBULATORY_SURGERY_CENTER): Payer: Medicare Other | Admitting: Internal Medicine

## 2022-01-17 ENCOUNTER — Encounter: Payer: Self-pay | Admitting: Internal Medicine

## 2022-01-17 ENCOUNTER — Encounter (HOSPITAL_BASED_OUTPATIENT_CLINIC_OR_DEPARTMENT_OTHER): Payer: Self-pay | Admitting: Cardiovascular Disease

## 2022-01-17 VITALS — BP 122/76 | HR 62 | Temp 98.6°F | Resp 18 | Ht 67.0 in | Wt 224.0 lb

## 2022-01-17 DIAGNOSIS — Z85028 Personal history of other malignant neoplasm of stomach: Secondary | ICD-10-CM

## 2022-01-17 DIAGNOSIS — D509 Iron deficiency anemia, unspecified: Secondary | ICD-10-CM

## 2022-01-17 DIAGNOSIS — K2950 Unspecified chronic gastritis without bleeding: Secondary | ICD-10-CM

## 2022-01-17 DIAGNOSIS — K635 Polyp of colon: Secondary | ICD-10-CM | POA: Diagnosis not present

## 2022-01-17 DIAGNOSIS — K219 Gastro-esophageal reflux disease without esophagitis: Secondary | ICD-10-CM | POA: Diagnosis not present

## 2022-01-17 DIAGNOSIS — K317 Polyp of stomach and duodenum: Secondary | ICD-10-CM | POA: Diagnosis not present

## 2022-01-17 MED ORDER — SODIUM CHLORIDE 0.9 % IV SOLN: 500.0000 mL | Freq: Once | INTRAVENOUS | Status: DC

## 2022-01-17 NOTE — Patient Instructions (Addendum)
- Patient has a contact number available for emergencies. The signs and symptoms of potential delayed complications were discussed with the patient. Return to normal activities tomorrow. Written discharge instructions were provided to the patient. - Resume previous diet. - Continue present medications. No NSAIDs for at least 3 weeks. - Resume Xarelto (rivaroxaban) at prior dose tomorrow. Refer to managing physician for further adjustment of therapy. Notify my office immediately if any black or tarry stools. - Await pathology results. - Repeat CBC, ferritin + IBC in 8 weeks.  YOU HAD AN ENDOSCOPIC PROCEDURE TODAY AT [facility]:   Refer to the procedure report that was given to you for any specific questions about what was found during the examination.  If the procedure report does not answer your questions, please call your gastroenterologist to clarify.  If you requested that your care partner not be given the details of your procedure findings, then the procedure report has been included in a sealed envelope for you to review at your convenience later.  YOU SHOULD EXPECT: Some feelings of bloating in the abdomen. Passage of more gas than usual.  Walking can help get rid of the air that was put into your GI tract during the procedure and reduce the bloating. If you had a lower endoscopy (such as a colonoscopy or flexible sigmoidoscopy) you may notice spotting of blood in your stool or on the toilet paper. If you underwent a bowel prep for your procedure, you may not have a normal bowel movement for a few days.  Please Note:  You might notice some irritation and congestion in your nose or some drainage.  This is from the oxygen used during your procedure.  There is no need for concern and it should clear up in a day or so.  SYMPTOMS TO REPORT IMMEDIATELY:  Following upper endoscopy (EGD)  Vomiting of blood or coffee ground material  New chest pain or pain under the shoulder  blades  Painful or persistently difficult swallowing  New shortness of breath  Fever of 100F or higher  Black, tarry-looking stools  For urgent or emergent issues, a gastroenterologist can be reached at any hour by calling [phone]. Do not use MyChart messaging for urgent concerns.    DIET:  We do recommend a small meal at first, but then you may proceed to your regular diet.  Drink plenty of fluids but you should avoid alcoholic beverages for 24 hours.  ACTIVITY:  You should plan to take it easy for the rest of today and you should NOT DRIVE or use heavy machinery until tomorrow (because of the sedation medicines used during the test).    FOLLOW UP: Our staff will call the number listed on your records the next business day following your procedure.  We will call around 7:15- 8:00 am to check on you and address any questions or concerns that you may have regarding the information given to you following your procedure. If we do not reach you, we will leave a message.     If any biopsies were taken you will be contacted by phone or by letter within the next 1-3 weeks.  Please call us at [phone] if you have not heard about the biopsies in 3 weeks.    SIGNATURES/CONFIDENTIALITY: You and/or your care partner have signed paperwork which will be entered into your electronic medical record.  These signatures attest to the fact that that the information above on your After Visit Summary has  been reviewed and is understood.  Full responsibility of the confidentiality of this discharge information lies with you and/or your care-partner.  

## 2022-01-17 NOTE — Progress Notes (Signed)
A and O x3. Report to RN. Tolerated MAC anesthesia well.Teeth unchanged after procedure. 

## 2022-01-17 NOTE — Progress Notes (Signed)
See office note dated 01/05/2022 for details and current H&P  Patient presenting to evaluate iron deficiency anemia in the setting of Xarelto Xarelto on hold x48 hours Has continued PPI Last colonoscopy May 2022  He remains appropriate for upper endoscopy here today

## 2022-01-17 NOTE — Progress Notes (Signed)
VS completed by DT.  Pt's states no medical or surgical changes since previsit or office visit.  

## 2022-01-17 NOTE — Progress Notes (Signed)
Called to room to assist during endoscopic procedure.  Patient ID and intended procedure confirmed with present staff. Received instructions for my participation in the procedure from the performing physician.  

## 2022-01-17 NOTE — Op Note (Signed)
[facility] Patient Name: [name] [name] Procedure Date: 01/17/2022 10:19 AM MRN: [id number] Endoscopist: Jerene Bears , MD Age: 71 Referring MD:  Date of Birth: February 26, 1951 Gender: Male Account #: [id number] Procedure:                Upper GI endoscopy Indications:              Iron deficiency anemia, hx of colon polyps with                            last colonoscopy May 2022 Medicines:                Monitored Anesthesia Care Procedure:                Pre-Anesthesia Assessment:                           - Prior to the procedure, a History and Physical                            was performed, and patient medications and                            allergies were reviewed. The patient's tolerance of                            previous anesthesia was also reviewed. The risks                            and benefits of the procedure and the sedation                            options and risks were discussed with the patient.                            All questions were answered, and informed consent                            was obtained. Prior Anticoagulants: The patient has                            taken Xarelto (rivaroxaban), last dose was 2 days                            prior to procedure. ASA Grade Assessment: III - A                            patient with severe systemic disease. After                            reviewing the risks and benefits, the patient was                            deemed in satisfactory condition to undergo the  procedure.                           After obtaining informed consent, the endoscope was                            passed under direct vision. Throughout the                            procedure, the patient's blood pressure, pulse, and                            oxygen saturations were monitored continuously. The                            Endoscope was introduced through the mouth, and                             advanced to the second part of duodenum. The upper                            GI endoscopy was accomplished without difficulty.                            The patient tolerated the procedure well. Scope In: Scope Out: Findings:                 The examined esophagus was normal.                           A single 12 mm semi-sessile polyp with stigmata of                            recent bleeding (adherent heme) was found in the                            gastric antrum. The polyp was removed with a hot                            snare. Resection and retrieval were complete. To                            prevent bleeding after the polypectomy, one                            hemostatic clip was successfully placed (MR                            conditional). There was no bleeding during, or at                            the end, of the procedure.                           Diffuse mildly erythematous mucosa  without bleeding                            was found in the gastric body and in the gastric                            antrum. Biopsies were taken with a cold forceps for                            histology and Helicobacter pylori testing.                           The examined duodenum was normal. Biopsies for                            histology were taken with a cold forceps for                            evaluation of celiac disease. Complications:            No immediate complications. Estimated Blood Loss:     Estimated blood loss: none. Impression:               - Normal esophagus.                           - A single gastric polyp. Resected and retrieved.                            Clip (MR conditional) was placed. Mostly likely                            source of IDA in setting of DOAC.                           - Erythematous mucosa in the gastric body and                            antrum. Biopsied.                           - Normal examined duodenum.  Biopsied. Recommendation:           - Patient has a contact number available for                            emergencies. The signs and symptoms of potential                            delayed complications were discussed with the                            patient. Return to normal activities tomorrow.                            Written discharge instructions were provided to the  patient.                           - Resume previous diet.                           - Continue present medications. No NSAIDs for at                            least 3 weeks.                           - Resume Xarelto (rivaroxaban) at prior dose                            tomorrow. Refer to managing physician for further                            adjustment of therapy. Notify my office immediately                            if any black or tarry stools.                           - Await pathology results.                           - Repeat CBC, ferritin + IBC in 8 weeks. Jerene Bears, MD 01/17/2022 10:44:40 AM This report has been signed electronically.

## 2022-01-18 ENCOUNTER — Telehealth: Payer: Self-pay

## 2022-01-18 ENCOUNTER — Other Ambulatory Visit: Payer: Self-pay

## 2022-01-18 DIAGNOSIS — D509 Iron deficiency anemia, unspecified: Secondary | ICD-10-CM

## 2022-01-18 NOTE — Telephone Encounter (Signed)
  Follow up Call-     01/17/2022    9:59 AM 08/25/2020    8:27 AM  Call back number  Post procedure Call Back phone  # [phone]  Permission to leave phone message Yes Yes     Patient questions:  Do you have a fever, pain , or abdominal swelling? No. Pain Score  0 *  Have you tolerated food without any problems? Yes.    Have you been able to return to your normal activities? Yes.    Do you have any questions about your discharge instructions: Diet   No. Medications  No. Follow up visit  No.  Do you have questions or concerns about your Care? No.  Actions: * If pain score is 4 or above: No action needed, pain <4.

## 2022-01-19 DIAGNOSIS — Z125 Encounter for screening for malignant neoplasm of prostate: Secondary | ICD-10-CM | POA: Diagnosis not present

## 2022-01-19 DIAGNOSIS — R7989 Other specified abnormal findings of blood chemistry: Secondary | ICD-10-CM | POA: Diagnosis not present

## 2022-01-19 DIAGNOSIS — E782 Mixed hyperlipidemia: Secondary | ICD-10-CM | POA: Diagnosis not present

## 2022-01-19 DIAGNOSIS — E1129 Type 2 diabetes mellitus with other diabetic kidney complication: Secondary | ICD-10-CM | POA: Diagnosis not present

## 2022-01-19 DIAGNOSIS — I10 Essential (primary) hypertension: Secondary | ICD-10-CM | POA: Diagnosis not present

## 2022-01-23 ENCOUNTER — Other Ambulatory Visit: Payer: Self-pay

## 2022-01-23 ENCOUNTER — Telehealth: Payer: Self-pay | Admitting: Internal Medicine

## 2022-01-23 ENCOUNTER — Inpatient Hospital Stay (HOSPITAL_COMMUNITY)
Admission: EM | Admit: 2022-01-23 | Discharge: 2022-01-25 | DRG: 378 | Disposition: A | Discharge status: Home / Self Care | Payer: Medicare Other | Attending: Internal Medicine | Admitting: Internal Medicine

## 2022-01-23 DIAGNOSIS — M1990 Unspecified osteoarthritis, unspecified site: Secondary | ICD-10-CM | POA: Diagnosis not present

## 2022-01-23 DIAGNOSIS — Z79899 Other long term (current) drug therapy: Secondary | ICD-10-CM

## 2022-01-23 DIAGNOSIS — Z87442 Personal history of urinary calculi: Secondary | ICD-10-CM

## 2022-01-23 DIAGNOSIS — Z86010 Personal history of colonic polyps: Secondary | ICD-10-CM

## 2022-01-23 DIAGNOSIS — E890 Postprocedural hypothyroidism: Secondary | ICD-10-CM | POA: Diagnosis not present

## 2022-01-23 DIAGNOSIS — D62 Acute posthemorrhagic anemia: Secondary | ICD-10-CM | POA: Diagnosis present

## 2022-01-23 DIAGNOSIS — G4733 Obstructive sleep apnea (adult) (pediatric): Secondary | ICD-10-CM | POA: Diagnosis not present

## 2022-01-23 DIAGNOSIS — E119 Type 2 diabetes mellitus without complications: Secondary | ICD-10-CM | POA: Diagnosis not present

## 2022-01-23 DIAGNOSIS — D649 Anemia, unspecified: Secondary | ICD-10-CM | POA: Diagnosis present

## 2022-01-23 DIAGNOSIS — Z7901 Long term (current) use of anticoagulants: Secondary | ICD-10-CM | POA: Diagnosis not present

## 2022-01-23 DIAGNOSIS — M810 Age-related osteoporosis without current pathological fracture: Secondary | ICD-10-CM | POA: Diagnosis not present

## 2022-01-23 DIAGNOSIS — Z8546 Personal history of malignant neoplasm of prostate: Secondary | ICD-10-CM

## 2022-01-23 DIAGNOSIS — I480 Paroxysmal atrial fibrillation: Secondary | ICD-10-CM | POA: Diagnosis present

## 2022-01-23 DIAGNOSIS — I509 Heart failure, unspecified: Secondary | ICD-10-CM | POA: Diagnosis not present

## 2022-01-23 DIAGNOSIS — N1831 Chronic kidney disease, stage 3a: Secondary | ICD-10-CM | POA: Diagnosis present

## 2022-01-23 DIAGNOSIS — F32A Depression, unspecified: Secondary | ICD-10-CM | POA: Diagnosis present

## 2022-01-23 DIAGNOSIS — R791 Abnormal coagulation profile: Secondary | ICD-10-CM | POA: Diagnosis present

## 2022-01-23 DIAGNOSIS — K920 Hematemesis: Secondary | ICD-10-CM | POA: Diagnosis not present

## 2022-01-23 DIAGNOSIS — E785 Hyperlipidemia, unspecified: Secondary | ICD-10-CM | POA: Diagnosis present

## 2022-01-23 DIAGNOSIS — K219 Gastro-esophageal reflux disease without esophagitis: Secondary | ICD-10-CM | POA: Diagnosis not present

## 2022-01-23 DIAGNOSIS — K2950 Unspecified chronic gastritis without bleeding: Secondary | ICD-10-CM | POA: Diagnosis present

## 2022-01-23 DIAGNOSIS — R197 Diarrhea, unspecified: Secondary | ICD-10-CM | POA: Diagnosis not present

## 2022-01-23 DIAGNOSIS — Z833 Family history of diabetes mellitus: Secondary | ICD-10-CM

## 2022-01-23 DIAGNOSIS — K259 Gastric ulcer, unspecified as acute or chronic, without hemorrhage or perforation: Secondary | ICD-10-CM | POA: Diagnosis not present

## 2022-01-23 DIAGNOSIS — Z79890 Hormone replacement therapy (postmenopausal): Secondary | ICD-10-CM

## 2022-01-23 DIAGNOSIS — E1122 Type 2 diabetes mellitus with diabetic chronic kidney disease: Secondary | ICD-10-CM | POA: Diagnosis present

## 2022-01-23 DIAGNOSIS — Z7985 Long-term (current) use of injectable non-insulin antidiabetic drugs: Secondary | ICD-10-CM

## 2022-01-23 DIAGNOSIS — E1159 Type 2 diabetes mellitus with other circulatory complications: Secondary | ICD-10-CM | POA: Diagnosis present

## 2022-01-23 DIAGNOSIS — Z7984 Long term (current) use of oral hypoglycemic drugs: Secondary | ICD-10-CM | POA: Diagnosis not present

## 2022-01-23 DIAGNOSIS — I152 Hypertension secondary to endocrine disorders: Secondary | ICD-10-CM | POA: Diagnosis present

## 2022-01-23 DIAGNOSIS — Z6834 Body mass index (BMI) 34.0-34.9, adult: Secondary | ICD-10-CM | POA: Diagnosis not present

## 2022-01-23 DIAGNOSIS — K922 Gastrointestinal hemorrhage, unspecified: Principal | ICD-10-CM | POA: Diagnosis present

## 2022-01-23 DIAGNOSIS — K254 Chronic or unspecified gastric ulcer with hemorrhage: Secondary | ICD-10-CM | POA: Diagnosis present

## 2022-01-23 DIAGNOSIS — E669 Obesity, unspecified: Secondary | ICD-10-CM | POA: Diagnosis not present

## 2022-01-23 DIAGNOSIS — I10 Essential (primary) hypertension: Secondary | ICD-10-CM | POA: Diagnosis not present

## 2022-01-23 DIAGNOSIS — R110 Nausea: Secondary | ICD-10-CM | POA: Diagnosis not present

## 2022-01-23 DIAGNOSIS — E1169 Type 2 diabetes mellitus with other specified complication: Secondary | ICD-10-CM | POA: Diagnosis present

## 2022-01-23 DIAGNOSIS — Z8673 Personal history of transient ischemic attack (TIA), and cerebral infarction without residual deficits: Secondary | ICD-10-CM

## 2022-01-23 DIAGNOSIS — Z8719 Personal history of other diseases of the digestive system: Secondary | ICD-10-CM

## 2022-01-23 DIAGNOSIS — I5042 Chronic combined systolic (congestive) and diastolic (congestive) heart failure: Secondary | ICD-10-CM | POA: Diagnosis present

## 2022-01-23 DIAGNOSIS — I5032 Chronic diastolic (congestive) heart failure: Secondary | ICD-10-CM | POA: Diagnosis present

## 2022-01-23 DIAGNOSIS — I110 Hypertensive heart disease with heart failure: Secondary | ICD-10-CM | POA: Diagnosis not present

## 2022-01-23 DIAGNOSIS — N183 Chronic kidney disease, stage 3 unspecified: Secondary | ICD-10-CM | POA: Diagnosis present

## 2022-01-23 DIAGNOSIS — Z91199 Patient's noncompliance with other medical treatment and regimen due to unspecified reason: Secondary | ICD-10-CM

## 2022-01-23 DIAGNOSIS — F419 Anxiety disorder, unspecified: Secondary | ICD-10-CM | POA: Diagnosis not present

## 2022-01-23 DIAGNOSIS — Z888 Allergy status to other drugs, medicaments and biological substances status: Secondary | ICD-10-CM

## 2022-01-23 DIAGNOSIS — E039 Hypothyroidism, unspecified: Secondary | ICD-10-CM | POA: Diagnosis present

## 2022-01-23 DIAGNOSIS — Z9079 Acquired absence of other genital organ(s): Secondary | ICD-10-CM

## 2022-01-23 LAB — COMPREHENSIVE METABOLIC PANEL
ALT: 15 U/L (ref 0–44)
AST: 16 U/L (ref 15–41)
Albumin: 2.7 g/dL — ABNORMAL LOW (ref 3.5–5.0)
Alkaline Phosphatase: 57 U/L (ref 38–126)
Anion gap: 11 (ref 5–15)
BUN: 54 mg/dL — ABNORMAL HIGH (ref 8–23)
CO2: 17 mmol/L — ABNORMAL LOW (ref 22–32)
Calcium: 9 mg/dL (ref 8.9–10.3)
Chloride: 112 mmol/L — ABNORMAL HIGH (ref 98–111)
Creatinine, Ser: 1.33 mg/dL — ABNORMAL HIGH (ref 0.61–1.24)
GFR, Estimated: 57 mL/min — ABNORMAL LOW (ref >60)
Glucose, Bld: 183 mg/dL — ABNORMAL HIGH (ref 70–99)
Potassium: 3.7 mmol/L (ref 3.5–5.1)
Sodium: 140 mmol/L (ref 135–145)
Total Bilirubin: 0.6 mg/dL (ref 0.3–1.2)
Total Protein: 5.1 g/dL — ABNORMAL LOW (ref 6.5–8.1)

## 2022-01-23 LAB — CBC WITH DIFFERENTIAL/PLATELET
Abs Immature Granulocytes: 0.1 10*3/uL — ABNORMAL HIGH (ref 0.00–0.07)
Basophils Absolute: 0.1 10*3/uL (ref 0.0–0.1)
Basophils Relative: 1 %
Eosinophils Absolute: 0.2 10*3/uL (ref 0.0–0.5)
Eosinophils Relative: 2 %
HCT: 27.1 % — ABNORMAL LOW (ref 39.0–52.0)
Hemoglobin: 8.1 g/dL — ABNORMAL LOW (ref 13.0–17.0)
Immature Granulocytes: 1 %
Lymphocytes Relative: 20 %
Lymphs Abs: 2 10*3/uL (ref 0.7–4.0)
MCH: 24.5 pg — ABNORMAL LOW (ref 26.0–34.0)
MCHC: 29.9 g/dL — ABNORMAL LOW (ref 30.0–36.0)
MCV: 82.1 fL (ref 80.0–100.0)
Monocytes Absolute: 0.9 10*3/uL (ref 0.1–1.0)
Monocytes Relative: 9 %
Neutro Abs: 6.8 10*3/uL (ref 1.7–7.7)
Neutrophils Relative %: 67 %
Platelets: 220 10*3/uL (ref 150–400)
RBC: 3.3 MIL/uL — ABNORMAL LOW (ref 4.22–5.81)
RDW: 20.4 % — ABNORMAL HIGH (ref 11.5–15.5)
WBC: 10 10*3/uL (ref 4.0–10.5)
nRBC: 0.2 % (ref 0.0–0.2)

## 2022-01-23 LAB — PROTIME-INR
INR: 1.4 — ABNORMAL HIGH (ref 0.8–1.2)
Prothrombin Time: 17.3 seconds — ABNORMAL HIGH (ref 11.4–15.2)

## 2022-01-23 LAB — POC OCCULT BLOOD, ED: Fecal Occult Bld: POSITIVE — AB

## 2022-01-23 LAB — PREPARE RBC (CROSSMATCH)

## 2022-01-23 MED ORDER — ONDANSETRON HCL 4 MG PO TABS: 4.0000 mg | ORAL_TABLET | Freq: Four times a day (QID) | ORAL | Status: DC | PRN

## 2022-01-23 MED ORDER — INSULIN ASPART 100 UNIT/ML IJ SOLN
0.0000 [IU] | INTRAMUSCULAR | Status: DC
  Administered 2022-01-24: 1 [IU] via SUBCUTANEOUS
  Administered 2022-01-24: 3 [IU] via SUBCUTANEOUS
  Administered 2022-01-25: 1 [IU] via SUBCUTANEOUS
  Administered 2022-01-25 (×2): 2 [IU] via SUBCUTANEOUS
  Filled 2022-01-23: qty 0.09

## 2022-01-23 MED ORDER — PANTOPRAZOLE 80MG IVPB - SIMPLE MED
80.0000 mg | Freq: Once | INTRAVENOUS | Status: AC
  Administered 2022-01-23: 80 mg via INTRAVENOUS
  Filled 2022-01-23: qty 80

## 2022-01-23 MED ORDER — SODIUM CHLORIDE 0.9 % IV SOLN: 10.0000 mL/h | Freq: Once | INTRAVENOUS | Status: DC

## 2022-01-23 MED ORDER — SODIUM CHLORIDE 0.9% FLUSH
3.0000 mL | Freq: Two times a day (BID) | INTRAVENOUS | Status: DC
  Administered 2022-01-24 (×2): 3 mL via INTRAVENOUS

## 2022-01-23 MED ORDER — ACETAMINOPHEN 325 MG PO TABS
650.0000 mg | ORAL_TABLET | Freq: Four times a day (QID) | ORAL | Status: DC | PRN
  Administered 2022-01-24: 650 mg via ORAL
  Filled 2022-01-23: qty 2

## 2022-01-23 MED ORDER — LEVOTHYROXINE SODIUM 50 MCG PO TABS
175.0000 ug | ORAL_TABLET | Freq: Every day | ORAL | Status: DC
  Administered 2022-01-25: 175 ug via ORAL
  Filled 2022-01-23: qty 1

## 2022-01-23 MED ORDER — LACTATED RINGERS IV BOLUS
1000.0000 mL | Freq: Once | INTRAVENOUS | Status: AC
  Administered 2022-01-23: 1000 mL via INTRAVENOUS

## 2022-01-23 MED ORDER — ACETAMINOPHEN 650 MG RE SUPP: 650.0000 mg | Freq: Four times a day (QID) | RECTAL | Status: DC | PRN

## 2022-01-23 MED ORDER — ONDANSETRON HCL 4 MG/2ML IJ SOLN
4.0000 mg | Freq: Once | INTRAMUSCULAR | Status: AC
  Administered 2022-01-23: 4 mg via INTRAVENOUS
  Filled 2022-01-23: qty 2

## 2022-01-23 MED ORDER — FENTANYL CITRATE PF 50 MCG/ML IJ SOSY
50.0000 ug | PREFILLED_SYRINGE | Freq: Once | INTRAMUSCULAR | Status: AC
  Administered 2022-01-23: 50 ug via INTRAVENOUS
  Filled 2022-01-23: qty 1

## 2022-01-23 MED ORDER — CARVEDILOL 25 MG PO TABS
37.5000 mg | ORAL_TABLET | Freq: Two times a day (BID) | ORAL | Status: DC
  Administered 2022-01-24 – 2022-01-25 (×3): 37.5 mg via ORAL
  Filled 2022-01-23 (×2): qty 1
  Filled 2022-01-23: qty 3

## 2022-01-23 MED ORDER — ROSUVASTATIN CALCIUM 10 MG PO TABS
10.0000 mg | ORAL_TABLET | Freq: Every day | ORAL | Status: DC
  Administered 2022-01-25: 10 mg via ORAL
  Filled 2022-01-23: qty 1

## 2022-01-23 MED ORDER — ONDANSETRON HCL 4 MG/2ML IJ SOLN: 4.0000 mg | Freq: Four times a day (QID) | INTRAMUSCULAR | Status: DC | PRN

## 2022-01-23 MED ORDER — PANTOPRAZOLE INFUSION (NEW) - SIMPLE MED
8.0000 mg/h | INTRAVENOUS | Status: DC
  Administered 2022-01-23 – 2022-01-25 (×4): 8 mg/h via INTRAVENOUS
  Filled 2022-01-23: qty 100
  Filled 2022-01-23 (×2): qty 80
  Filled 2022-01-23 (×2): qty 100
  Filled 2022-01-23: qty 80
  Filled 2022-01-23: qty 100

## 2022-01-23 MED ORDER — DULOXETINE HCL 60 MG PO CPEP
60.0000 mg | ORAL_CAPSULE | Freq: Every day | ORAL | Status: DC
  Administered 2022-01-25: 60 mg via ORAL
  Filled 2022-01-23: qty 1

## 2022-01-23 NOTE — ED Provider Notes (Signed)
[facility] DEPT Provider Note   CSN: [id number] Arrival date & time: 01/23/22  2023     History {Add pertinent medical, surgical, social history, OB history to HPI:1} Chief Complaint  Patient presents with   Hematemesis   Rectal Bleeding    [name] [name] is a 71 y.o. male.  He was recently found to have anemia and underwent endoscopy by Dr. Hilarie Fredrickson 6 days ago.  It sounds like they found a gastric ulcer which was treated.  He restarted his blood thinner 3 days ago.  Today he passed to black bowel movements and has vomited twice with some coffee-ground material.  No abdominal pain.  Does feel very dizzy lightheaded.  No chest pain or shortness of breath.  The history is provided by the patient.  Rectal Bleeding Quality:  Black and tarry Amount:  Moderate Timing:  Sporadic Chronicity:  New Similar prior episodes: no   Relieved by:  None tried Worsened by:  Nothing Ineffective treatments:  None tried Associated symptoms: dizziness, hematemesis, light-headedness and vomiting   Associated symptoms: no abdominal pain and no fever   Risk factors: anticoagulant use        Home Medications Prior to Admission medications   Medication Sig Start Date End Date Taking? Authorizing Provider  acetaminophen (TYLENOL) 500 MG tablet Take 1,000 mg by mouth every 6 (six) hours as needed for moderate pain or headache.    [provider]  ALPRAZolam Duanne Moron) 1 MG tablet Take 1 tablet (1 mg total) by mouth 3 (three) times daily as needed for anxiety. Patient taking differently: Take 1 mg by mouth daily as needed for anxiety. 02/27/20   Roseanne Kaufman, MD  bismuth subsalicylate (PEPTO BISMOL) 262 MG/15ML suspension Take 30 mLs by mouth every 6 (six) hours as needed for indigestion or diarrhea or loose stools.    [provider]  carvedilol (COREG) 12.5 MG tablet TAKE 3 TABLETS BY MOUTH TWICE DAILY 11/22/21   Skeet Latch, MD  DULoxetine  (CYMBALTA) 60 MG capsule Take 60 mg by mouth daily. 03/27/20   [provider]  esomeprazole (NEXIUM) 40 MG capsule Take 40 mg by mouth daily.     [provider]  FOLIC ACID PO Take 7,124 mcg by mouth daily.    [provider]  Glucosamine HCl 1500 MG TABS Take 1,500 mg by mouth in the morning and at bedtime.    [provider]  HYDROmorphone (DILAUDID) 2 MG tablet Take 1 tablet (2 mg total) by mouth every 4 (four) hours as needed for severe pain. 09/05/21   Orpah Greek, MD  JARDIANCE 25 MG TABS tablet Take 25 mg by mouth daily. 06/12/19   [provider]  levothyroxine (SYNTHROID) 175 MCG tablet Take 175 mcg by mouth daily. 07/01/19   [provider]  losartan (COZAAR) 50 MG tablet Take 50 mg by mouth every evening. 07/02/19   [provider]  LYSINE PO Take 1 tablet by mouth daily as needed (cold sores).    [provider]  Meclizine HCl (BONINE PO) Take 1 tablet by mouth daily as needed (nausea).    [provider]  metFORMIN (GLUCOPHAGE-XR) 500 MG 24 hr tablet Take 1,000 mg by mouth 2 (two) times daily. 07/15/19   [provider]  MULTAQ 400 MG tablet TAKE 1 TABLET TWICE DAILY  WITH MEALS Patient taking differently: Take 400 mg by mouth 2 (two) times daily with a meal. 11/16/20   Skeet Latch, MD  Multiple Vitamin (MULTIVITAMIN WITH MINERALS) TABS tablet Take 1 tablet by mouth daily. One-A-Day for Men    [provider]  Multiple Vitamins-Minerals (PRESERVISION AREDS 2) CAPS Take 1 capsule by mouth 2 (two) times daily.    [provider]  mupirocin ointment (BACTROBAN) 2 % Apply 1 application topically 2 (two) times daily as needed (skin wounds). 01/30/20   [provider]  ondansetron (ZOFRAN) 4 MG tablet Take 4 mg by mouth 2 (two) times daily as needed for nausea.  01/14/20   [provider]  [facility] VERIO test strip 1 each daily. 02/21/20   [provider]  polycarbophil (FIBERCON) 625 MG tablet Take 1,250 mg by mouth every evening.    [provider]  potassium chloride SA (KLOR-CON) 20 MEQ tablet Take 40 mEq by mouth 2 (two) times daily. Patient not taking: Reported on 01/05/2022    [provider]  Probiotic Product (PROBIOTIC PO) Take 1 capsule by mouth at bedtime.    [provider]  rivaroxaban (XARELTO) 20 MG TABS tablet TAKE 1 TABLET DAILY WITH   SUPPER Patient not taking: Reported on 01/17/2022 07/05/21   Skeet Latch, MD  rosuvastatin (CRESTOR) 10 MG tablet Take 1 tablet by mouth once daily 09/14/21   Skeet Latch, MD  tamsulosin (FLOMAX) 0.4 MG CAPS capsule Take 0.4 mg by mouth daily as needed (kidney stones).  06/10/19   [provider]  TRULICITY 9.38 BO/1.7PZ SOPN Inject 0.75 mg into the skin once a week. Thursday 06/22/21   [provider]  vitamin B-12 (CYANOCOBALAMIN) 1000 MCG tablet Take 1,000 mcg by mouth daily.    [provider]  XARELTO 15 MG TABS tablet Take 15 mg by mouth daily. 08/23/21   [provider]      Allergies    Bystolic [nebivolol hcl], Calcium channel blockers, Phenergan [promethazine hcl], Beta adrenergic blockers, Cardizem [diltiazem], Norvasc [amlodipine], Promethazine, Aspirin, Nsaids, and Prednisone    Review of Systems   Review of Systems  Constitutional:  Positive for fatigue. Negative for fever.  HENT:  Negative for sore throat.   Respiratory:  Negative for shortness of breath.   Cardiovascular:  Negative for chest pain.  Gastrointestinal:  Positive for diarrhea, hematemesis, hematochezia, nausea and vomiting. Negative for abdominal pain.  Genitourinary:  Negative for dysuria.  Skin:  Negative for rash.  Neurological:  Positive for dizziness and light-headedness.    Physical Exam Updated Vital Signs BP (!) 142/82   Pulse 78   Temp 98.1 F (36.7 C) (Oral)   Resp 18   SpO2 100%  Physical Exam Vitals and nursing  note reviewed.  Constitutional:      General: He is not in acute distress.    Appearance: Normal appearance. He is well-developed.  HENT:     Head: Normocephalic and atraumatic.  Eyes:     Conjunctiva/sclera: Conjunctivae normal.  Cardiovascular:     Rate and Rhythm: Normal rate and regular rhythm.     Heart sounds: No murmur heard. Pulmonary:     Effort: Pulmonary effort is normal. No respiratory distress.     Breath sounds: Normal breath sounds.  Abdominal:     Palpations: Abdomen is soft.     Tenderness: There is no abdominal tenderness. There is no guarding or rebound.  Musculoskeletal:        General: Normal range of motion.     Cervical back: Neck supple.     Right lower leg: No edema.  Left lower leg: No edema.  Skin:    General: Skin is warm and dry.     Capillary Refill: Capillary refill takes less than 2 seconds.  Neurological:     General: No focal deficit present.     Mental Status: He is alert.     Sensory: No sensory deficit.     Motor: No weakness.     ED Results / Procedures / Treatments   Labs (all labs ordered are listed, but only abnormal results are displayed) Labs Reviewed  COMPREHENSIVE METABOLIC PANEL  CBC WITH DIFFERENTIAL/PLATELET  PROTIME-INR  TYPE AND SCREEN    EKG None  Radiology No results found.  Procedures Procedures  {Document cardiac monitor, telemetry assessment procedure when appropriate:1}  Medications Ordered in ED Medications  lactated ringers bolus 1,000 mL (has no administration in time range)  pantoprazole (PROTONIX) 80 mg /NS 100 mL IVPB (has no administration in time range)  pantoprozole (PROTONIX) 80 mg /NS 100 mL infusion (has no administration in time range)  ondansetron (ZOFRAN) injection 4 mg (has no administration in time range)    ED Course/ Medical Decision Making/ A&P                           Medical Decision Making Amount and/or Complexity of Data Reviewed Labs: ordered.  Risk Prescription  drug management.  This patient complains of ***; this involves an extensive number of treatment Options and is a complaint that carries with it a high risk of complications and morbidity. The differential includes ***  I ordered, reviewed and interpreted labs, which included *** I ordered medication *** and reviewed PMP when indicated. I ordered imaging studies which included *** and I independently    visualized and interpreted imaging which showed *** Additional history obtained from *** Previous records obtained and reviewed *** I consulted *** and discussed lab and imaging findings and discussed disposition.  Cardiac monitoring reviewed, *** Social determinants considered, *** Critical Interventions: ***  After the interventions stated above, I reevaluated the patient and found *** Admission and further testing considered, ***    {Document critical care time when appropriate:1} {Document review of labs and clinical decision tools ie heart score, Chads2Vasc2 etc:1}  {Document your independent review of radiology images, and any outside records:1} {Document your discussion with family members, caretakers, and with consultants:1} {Document social determinants of health affecting pt's care:1} {Document your decision making why or why not admission, treatments were needed:1} Final Clinical Impression(s) / ED Diagnoses Final diagnoses:  None    Rx / DC Orders ED Discharge Orders     None

## 2022-01-23 NOTE — ED Notes (Signed)
Consent signed for blood transfusion

## 2022-01-23 NOTE — Assessment & Plan Note (Signed)
Intolerant to CPAP.  Uses mouth guard qhs.

## 2022-01-23 NOTE — Assessment & Plan Note (Addendum)
Symptomatic anemia Recent EGD 01/17/2022 found to have a single gastric polyp s/p resection and clip.  Presenting with melena, coffee-ground emesis.  Hemoglobin down to 8.1 from 11.1 on 10/11. -[facility] to follow -N.p.o. after midnight -Continue IV Protonix infusion -Transfusing 1 unit PRBC -Hold Xarelto

## 2022-01-23 NOTE — Assessment & Plan Note (Addendum)
Sinus rhythm on admission.  Holding Xarelto.  Continue Multaq and carvedilol.

## 2022-01-23 NOTE — Assessment & Plan Note (Signed)
Hold metformin and Jardiance.  Placed on SSI.

## 2022-01-23 NOTE — H&P (Signed)
History and Physical    NISSAN FRAZZINI GYJ:[id number] DOB: [birth year] DOA: 01/23/2022  PCP: Donnajean Lopes, MD  Patient coming from: Home  I have personally briefly reviewed patient's old medical records in [facility]  Chief Complaint: Lightheadedness, black stools  HPI: [name] [name] is a 71 y.o. male with medical history significant for PAF on Xarelto, chronic HFpEF (EF 60-65%), CKD stage IIIa, T2DM, IDA, HTN, HLD, hypothyroidism, depression/anxiety, OSA intolerant to CPAP using mouth guard who presented to the ED for evaluation of lightheadedness in setting of black stools and coffee-ground emesis.  Patient recently underwent upper endoscopy on 01/17/2022 by [facility], Dr. Hilarie Fredrickson, for evaluation of iron deficiency anemia.  A single gastric polyp was seen, resected, and clip placed.  Pathology showed a gastric hyperplastic polyp and mild chronic gastritis changes.  Patient held [place] prior to procedure and resumed it night of 10/11.  Patient did have follow-up labs with his PCP morning of 10/11.  His wife was able to show me the results via their patient portal and at that time he had a hemoglobin level of 11.3.  Patient was feeling well until earlier today (10/15) while he was standing up in church he had to sit down because he felt as if he was going to faint.  Later in the evening he developed new dark black-colored stools.  He had an episode of emesis consisting of mostly clear liquid but some coffee-ground appearance as well.  He was feeling very lethargic and lightheaded but did not pass out.  He has been having some epigastric discomfort as well.  He has not had any chest pain or dyspnea.  ED Course  Labs/Imaging on admission: I have personally reviewed following labs and imaging studies.  Initial vitals showed BP 142/82, pulse 84, RR 18, temp 98.1 F, SPO2 100%.  Labs show hemoglobin 8.1 (previously 11.6 on 01/05/2022), hematocrit 27.1, platelets 220,000, WBC 10.0,  INR 1.4.  Sodium 140, potassium 3.7, bicarb 17, BUN 54, creatinine 1.33, serum glucose 183, LFTs within normal limits.  FOBT is positive.  Patient was given 1 L LR.  EDP discussed with on-call [facility], Dr. Hilarie Fredrickson, who recommended IV Protonix bolus followed by infusion, n.p.o. at midnight, and they will see in consultation tomorrow with plan for EGD.  Patient was also ordered to receive 1 unit PRBC transfusion.  The hospitalist service was consulted to admit for further evaluation and management.  Review of Systems: All systems reviewed and are negative except as documented in history of present illness above.   Past Medical History:  Diagnosis Date   Arthritis    OA   Atrial flutter ([place])    a. s/p RFCA 8/13   Cancer ([place]) 2010   prostate   Chronic combined systolic and diastolic CHF (congestive heart failure) ([place])    a. LVEF previously 35% felt to be due tachycardia;  b. 02/2013 Echo: EF 50-55%, no rwma, mildly dil LA/RA, mild to mod MR. C 11/2014 echo - ef 60-65%, unable to determine DD   CKD (chronic kidney disease), stage III (HCC)    hx of a/c renal failure during episode of diverticulitis 9/13 in [place], [place]   Diabetes mellitus ([place])    TYPE 2    Diverticulitis    Diverticulosis    Dyslipidemia    Dysrhythmia    Erectile dysfunction    Gastric ulcer    s/p prior surgery   GERD (gastroesophageal reflux disease)    Headache  History of colonic diverticulitis    History of prostate cancer    Hypertension    Hypothyroid    Kidney stones    Microcytic anemia    Obesities, morbid ([place])    Obstructive sleep apnea    noncompliant with CPAP   Osteoporosis    PAF (paroxysmal atrial fibrillation) ([place])    a. failed DCCV and multiple anti-arrhythmic drugs (multaq/flecainide);   b. s/p  PVI isolation with ablation of AFib 8/13; c. repeat PVI 01/2013;  d. 02/2013 repeat DCCV->Amio load/xarelto.   Peptic ulcer disease 1994   Renal calculi    Sleep apnea 2015   has not had  sleep apnea since heart intervention   Thrombocytopenia ([place]) 1992   idopathic-treated with danazol   Tubular adenoma of colon     Past Surgical History:  Procedure Laterality Date   ATRIAL FIBRILLATION ABLATION  12/06/11; 02/05/2013   Afib and atrial flutter ablation by Dr Rayann Heman; repeat PVI by Dr Rayann Heman 02/05/2013   ATRIAL FIBRILLATION ABLATION N/A 12/06/2011   Procedure: ATRIAL FIBRILLATION ABLATION;  Surgeon: Thompson Grayer, MD;  Location: [facility] CATH LAB;  Service: Cardiovascular;  Laterality: N/A;   ATRIAL FIBRILLATION ABLATION N/A 02/05/2013   Procedure: ATRIAL FIBRILLATION ABLATION;  Surgeon: Coralyn Mark, MD;  Location: [facility] CATH LAB;  Service: Cardiovascular;  Laterality: N/A;   benign stomach tumor removal  2000   CARDIOVERSION  08/25/2011   Procedure: CARDIOVERSION;  Surgeon: Jettie Booze, MD;  Location: [facility];  Service: Cardiovascular;  Laterality: N/A;   CARDIOVERSION N/A 03/05/2013   Procedure: CARDIOVERSION;  Surgeon: Sinclair Grooms, MD;  Location: [place];  Service: Cardiovascular;  Laterality: N/A;  BEDSIDE    CARDIOVERSION N/A 03/25/2020   Procedure: CARDIOVERSION;  Surgeon: Skeet Latch, MD;  Location: [place];  Service: Cardiovascular;  Laterality: N/A;   CARDIOVERSION N/A 06/02/2020   Procedure: CARDIOVERSION;  Surgeon: Pixie Casino, MD;  Location[facility]COPY;  Service: Cardiovascular;  Laterality: N/A;   CARPAL [place] Right 02/25/2020   Procedure: RIGHT CARPAL TUNNEL RELEASE, CUBITAL TUNNEL RELEASE IN SITU, RIGHT WRIST PROXIMAL ROW CARPECTOMY AND PROXIMAL CAPITATE HEAD REPLACEMENT/HEMIARTHROPLASTY WITH POSTERIOR INTEROSSOUS NERVE NEURECTOMY AND REPAIR;  Surgeon: Roseanne Kaufman, MD;  Location: [facility];  Service: Orthopedics;  Laterality: Right;  2.5 hrs Block with IV Sedation   convergent afib abaltion [facility] 02/26/15  02/26/2015   [facility] by Dr. Lehman Prom and Dr. Danise Mina   ELECTROPHYSIOLOGIC STUDY N/A 11/24/2014   Procedure:  Cardioversion;  Surgeon: Will Meredith Leeds, MD;  Location: [facility] CV LAB;  Service: Cardiovascular;  Laterality: N/A;   ESOPHAGOGASTRODUODENOSCOPY N/A 04/09/2013   Procedure: ESOPHAGOGASTRODUODENOSCOPY (EGD) with possible Balloon Dilation;  Surgeon: Garlan Fair, MD;  Location: [facility] ENDOSCOPY;  Service: Endoscopy;  Laterality: N/A;   kidney stone removal     multiple   KNEE ARTHROSCOPY Bilateral 1979   LAPAROSCOPIC RETROPUBIC PROSTATECTOMY  02/2007   hx prostate cancer   ORIF ANKLE FRACTURE Right 10/2015   ORIF ANKLE FRACTURE Right 11/03/2015   Procedure: ORIF RIGHT LISFRANK FOOT;  Surgeon: Newt Minion, MD;  Location: [facility];  Service: Orthopedics;  Laterality: Right;   TEE WITHOUT CARDIOVERSION  08/25/2011   Procedure: TRANSESOPHAGEAL ECHOCARDIOGRAM (TEE);  Surgeon: Jettie Booze, MD;  Location: [place];  Service: Cardiovascular;  Laterality: N/A;   TEE WITHOUT CARDIOVERSION  11/09/2011   Procedure: TRANSESOPHAGEAL ECHOCARDIOGRAM (TEE);  Surgeon: Jettie Booze, MD;  Location: [facility];  Service: Cardiovascular;  Laterality: N/A;  h/p in file drawer   TEE WITHOUT CARDIOVERSION N/A 02/05/2013   Procedure: TRANSESOPHAGEAL ECHOCARDIOGRAM (TEE);  Surgeon: Candee Furbish, MD;  Location: [facility] ENDOSCOPY;  Service: Cardiovascular;  Laterality: N/A;   TEE WITHOUT CARDIOVERSION N/A 03/25/2020   Procedure: TRANSESOPHAGEAL ECHOCARDIOGRAM (TEE);  Surgeon: Skeet Latch, MD;  Location: [place];  Service: Cardiovascular;  Laterality: N/A;   TOTAL THYROIDECTOMY  2009   benign nodules removed   UPPER GASTROINTESTINAL ENDOSCOPY     WRIST SURGERY Right 1977   with placement of lunate prosthesis    Social History:  reports that he has never smoked. He has never used smokeless tobacco. He reports that he does not drink alcohol and does not use drugs.  Allergies  Allergen Reactions   Bystolic [Nebivolol Hcl] Swelling    Bradycardia    Calcium Channel Blockers Swelling     LE edema   Phenergan [Promethazine Hcl] Itching    Hallucination    Beta Adrenergic Blockers Swelling    bradycardia   Cardizem [Diltiazem] Other (See Comments)    Foot swelling and increased his heart rate   Norvasc [Amlodipine] Other (See Comments)   Promethazine Itching and Other (See Comments)   Aspirin Other (See Comments)    Hx of ulcer    Nsaids     Hx ulcer   Prednisone Other (See Comments)    Hx of ulcer    Family History  Problem Relation Age of Onset   Emphysema Mother    Lung cancer Father    Diabetes Brother    Atrial fibrillation Brother    Colon polyps Brother    Throat cancer Sister    Colon polyps Sister    Lung cancer Sister    Colon polyps Sister    Colon cancer Neg Hx    Esophageal cancer Neg Hx    Rectal cancer Neg Hx    Stomach cancer Neg Hx      Prior to Admission medications   Medication Sig Start Date End Date Taking? Authorizing Provider  acetaminophen (TYLENOL) 500 MG tablet Take 1,000 mg by mouth every 6 (six) hours as needed for moderate pain or headache.    [provider]  ALPRAZolam Duanne Moron) 1 MG tablet Take 1 tablet (1 mg total) by mouth 3 (three) times daily as needed for anxiety. Patient taking differently: Take 1 mg by mouth daily as needed for anxiety. 02/27/20   Roseanne Kaufman, MD  bismuth subsalicylate (PEPTO BISMOL) 262 MG/15ML suspension Take 30 mLs by mouth every 6 (six) hours as needed for indigestion or diarrhea or loose stools.    [provider]  carvedilol (COREG) 12.5 MG tablet TAKE 3 TABLETS BY MOUTH TWICE DAILY 11/22/21   Skeet Latch, MD  DULoxetine (CYMBALTA) 60 MG capsule Take 60 mg by mouth daily. 03/27/20   [provider]  esomeprazole (NEXIUM) 40 MG capsule Take 40 mg by mouth daily.     [provider]  FOLIC ACID PO Take 4,481 mcg by mouth daily.    [provider]  Glucosamine HCl 1500 MG TABS Take 1,500 mg by mouth in the morning and at bedtime.    [provider]  HYDROmorphone (DILAUDID) 2 MG tablet Take 1 tablet (2 mg total) by mouth every 4 (four) hours as needed for severe pain. 09/05/21   Orpah Greek, MD  JARDIANCE 25 MG TABS tablet Take 25 mg by mouth daily. 06/12/19   [provider]  levothyroxine (SYNTHROID) 175 MCG tablet Take 175  mcg by mouth daily. 07/01/19   [provider]  losartan (COZAAR) 50 MG tablet Take 50 mg by mouth every evening. 07/02/19   [provider]  LYSINE PO Take 1 tablet by mouth daily as needed (cold sores).    [provider]  Meclizine HCl (BONINE PO) Take 1 tablet by mouth daily as needed (nausea).    [provider]  metFORMIN (GLUCOPHAGE-XR) 500 MG 24 hr tablet Take 1,000 mg by mouth 2 (two) times daily. 07/15/19   [provider]  MULTAQ 400 MG tablet TAKE 1 TABLET TWICE DAILY  WITH MEALS Patient taking differently: Take 400 mg by mouth 2 (two) times daily with a meal. 11/16/20   Skeet Latch, MD  Multiple Vitamin (MULTIVITAMIN WITH MINERALS) TABS tablet Take 1 tablet by mouth daily. One-A-Day for Men    [provider]  Multiple Vitamins-Minerals (PRESERVISION AREDS 2) CAPS Take 1 capsule by mouth 2 (two) times daily.    [provider]  mupirocin ointment (BACTROBAN) 2 % Apply 1 application topically 2 (two) times daily as needed (skin wounds). 01/30/20   [provider]  ondansetron (ZOFRAN) 4 MG tablet Take 4 mg by mouth 2 (two) times daily as needed for nausea.  01/14/20   [provider]  [facility] VERIO test strip 1 each daily. 02/21/20   [provider]  polycarbophil (FIBERCON) 625 MG tablet Take 1,250 mg by mouth every evening.    [provider]  potassium chloride SA (KLOR-CON) 20 MEQ tablet Take 40 mEq by mouth 2 (two) times daily. Patient not taking: Reported on 01/05/2022    [provider]  Probiotic Product (PROBIOTIC PO) Take 1 capsule by mouth at bedtime.     [provider]  rivaroxaban (XARELTO) 20 MG TABS tablet TAKE 1 TABLET DAILY WITH   SUPPER Patient not taking: Reported on 01/17/2022 07/05/21   Skeet Latch, MD  rosuvastatin (CRESTOR) 10 MG tablet Take 1 tablet by mouth once daily 09/14/21   Skeet Latch, MD  tamsulosin (FLOMAX) 0.4 MG CAPS capsule Take 0.4 mg by mouth daily as needed (kidney stones).  06/10/19   [provider]  TRULICITY 5.36 IW/8.0HO SOPN Inject 0.75 mg into the skin once a week. Thursday 06/22/21   [provider]  vitamin B-12 (CYANOCOBALAMIN) 1000 MCG tablet Take 1,000 mcg by mouth daily.    [provider]  XARELTO 15 MG TABS tablet Take 15 mg by mouth daily. 08/23/21   [provider]    Physical Exam: Vitals:   01/23/22 2034 01/23/22 2145 01/23/22 2200  BP: (!) 142/82 135/86 128/79  Pulse: 78 80 72  Resp: '18 16 15  '$ Temp: 98.1 F (36.7 C)    TempSrc: Oral    SpO2: 100% 97% 99%   Constitutional: Resting in bed, NAD, calm, comfortable Eyes: EOMI, lids and conjunctivae normal ENMT: Mucous membranes are moist. Posterior pharynx clear of any exudate or lesions.Normal dentition.  Neck: normal, supple, no masses. Respiratory: clear to auscultation bilaterally, no wheezing, no crackles. Normal respiratory effort. No accessory muscle use.  Cardiovascular: Regular rate and rhythm, soft systolic murmur. No extremity edema. 2+ pedal pulses. Abdomen: no tenderness, no masses palpated. No hepatosplenomegaly. Bowel sounds positive.  Musculoskeletal: no clubbing / cyanosis. No joint deformity upper and lower extremities. Good ROM, no contractures. Normal muscle tone.  Skin: no rashes, lesions, ulcers. No induration Neurologic: Sensation intact. Strength 5/5 in all 4.  Psychiatric: Alert and oriented x 3. Normal mood.  EKG: Personally reviewed. Sinus rhythm, rate 81, PVCs and PACs present.  Premature contractions new when compared to prior.  Assessment/Plan Principal  Problem:   Acute upper GI bleeding Active Problems:   Symptomatic anemia   Paroxysmal atrial fibrillation (HCC)   Chronic heart failure with preserved ejection fraction (HFpEF) (HCC)   Type 2 diabetes mellitus with stage 3 chronic kidney disease (HCC)   Hypertension associated with diabetes (HCC)   Obstructive sleep apnea-C-pap intol   Chronic kidney disease, stage 3a (HCC)   Hypothyroidism   Hyperlipidemia associated with type 2 diabetes mellitus ([place])   TENNESSEE PERRA is a 71 y.o. male with medical history significant for PAF on Xarelto, chronic HFpEF (EF 60-65%), CKD stage IIIa, T2DM, IDA, HTN, HLD, hypothyroidism, depression/anxiety, OSA intolerant to CPAP using mouth guard who is admitted with symptomatic anemia due to acute upper GI bleed.  Assessment and Plan: * Acute upper GI bleeding Symptomatic anemia Recent EGD 01/17/2022 found to have a single gastric polyp s/p resection and clip.  Presenting with melena, coffee-ground emesis.  Hemoglobin down to 8.1 from 11.1 on 10/11. -[facility] to follow -N.p.o. after midnight -Continue IV Protonix infusion -Transfusing 1 unit PRBC -Hold Xarelto  Paroxysmal atrial fibrillation (HCC) Sinus rhythm on admission.  Holding Xarelto.  Continue Multaq and carvedilol.  Chronic heart failure with preserved ejection fraction (HFpEF) (HCC) Euvolemic on admission.  Last EF 60-65% by TEE 03/25/2020.  Not on diuretics as an outpatient. -Continue Coreg -Hold Jardiance -Monitor I/O's and daily weights  Type 2 diabetes mellitus with stage 3 chronic kidney disease ([place]) Hold metformin and Jardiance.  Placed on SSI.  Hypertension associated with diabetes ([place]) BP stable, continue Coreg but will hold losartan due to risk for hypotension with active GI bleed.  Hyperlipidemia associated with type 2 diabetes mellitus (HCC) Continue rosuvastatin.  Hypothyroidism Continue Synthroid.  Chronic kidney disease, stage 3a (HCC) BUN elevated in setting  of UGI bleed otherwise renal function stable.  Obstructive sleep apnea-C-pap intol Intolerant to CPAP.  Uses mouth guard qhs.  DVT prophylaxis: SCDs Start: 01/23/22 2244 Code Status: Full code, confirmed with patient on admission Family Communication: Spouse at bedside Disposition Plan: From home and likely discharge to home pending clinical progress Consults called:  GI, Dr. Hilarie Fredrickson Severity of Illness: The appropriate patient status for this patient is INPATIENT. Inpatient status is judged to be reasonable and necessary in order to provide the required intensity of service to ensure the patient's safety. The patient's presenting symptoms, physical exam findings, and initial radiographic and laboratory data in the context of their chronic comorbidities is felt to place them at high risk for further clinical deterioration. Furthermore, it is not anticipated that the patient will be medically stable for discharge from the hospital within 2 midnights of admission.   * I certify that at the point of admission it is my clinical judgment that the patient will require inpatient hospital care spanning beyond 2 midnights from the point of admission due to high intensity of service, high risk for further deterioration and high frequency of surveillance required.Zada Finders MD [facility]  If 7PM-7AM, please contact night-coverage [email or web address]  01/23/2022, 11:31 PM

## 2022-01-23 NOTE — Assessment & Plan Note (Signed)
Continue rosuvastatin.  

## 2022-01-23 NOTE — Assessment & Plan Note (Signed)
BP stable, continue Coreg but will hold losartan due to risk for hypotension with active GI bleed.

## 2022-01-23 NOTE — Assessment & Plan Note (Signed)
BUN elevated in setting of UGI bleed otherwise renal function stable.

## 2022-01-23 NOTE — Hospital Course (Signed)
[name] [name] is a 71 y.o. male with medical history significant for PAF on Xarelto, chronic HFpEF (EF 60-65%), CKD stage IIIa, T2DM, IDA, HTN, HLD, hypothyroidism, depression/anxiety, OSA intolerant to CPAP using mouth guard who is admitted with symptomatic anemia due to acute upper GI bleed.10/16 EGD with Dr. Loletha Carrow showed nonbleeding linear gastric ulcer prepyloric region at the polypectomy site in the 8 to 10 mm largest diameter, injected with epi, 3 clips placed. Patient had some abdominal discomfort and nausea afterwards due to appendectomy, this is resolved. Had bowel movement this morning 2 AM, likely old blood. Continue proton pump inhibitor pantoprazole 40 mg twice daily outpatient for at least 2 to 3 months. GI will schedule for follow-up in the office with Dr. Hilarie Fredrickson or one of the APP's>GI advised to hold xarelto for at least 5 days after procedure.

## 2022-01-23 NOTE — Telephone Encounter (Signed)
I was contacted by patient's wife this evening by phone Pt known to me: EGD done 6 days ago for IDA and gastric polyp removed with prophylactic clip placed. Started Xarelto back 4 days ago. Usual state of health until mid day when he felt tired.  This evening had a soft black stool.  Mild nausea.  I advised there is concern for gastric bleeding in setting of recent polypectomy and DOAC. I advised the following: --Stop Xarelto --Continue PPI --Call 911 for transport to ER if: worsening weakness or presyncopal symptoms, another black or bloody stool, any vomiting of blood or coffee-ground, chest pain, dyspnea.--his wife voiced understanding --Ok to use previous Rx'ed zofran 4 mg as directed --Liquid diet until tomorrow --If no further bleeding or need for ER visit, then CBC in the morning at [facility] lab --Gatorade or other electrolyte fluid tonight to maximize hydration --Call me back if any further questions tonight  Office to check on him tomorrow if he has remained at home.

## 2022-01-23 NOTE — ED Triage Notes (Addendum)
BIB EMS for nausea, weakness,coffee ground emesis, and black stools. Pt had an endoscopy on 01/19/22, is on thinners  BP 100/78 HR 70-90 Afib 99% ra

## 2022-01-23 NOTE — Assessment & Plan Note (Signed)
Euvolemic on admission.  Last EF 60-65% by TEE 03/25/2020.  Not on diuretics as an outpatient. -Continue Coreg -Hold Jardiance -Monitor I/O's and daily weights

## 2022-01-23 NOTE — Assessment & Plan Note (Signed)
Continue Synthroid °

## 2022-01-24 ENCOUNTER — Encounter (HOSPITAL_COMMUNITY)
Admission: EM | Disposition: A | Discharge status: Home / Self Care | Payer: Self-pay | Source: Home / Self Care | Attending: Family Medicine

## 2022-01-24 ENCOUNTER — Inpatient Hospital Stay (HOSPITAL_COMMUNITY): Payer: Medicare Other | Admitting: Anesthesiology

## 2022-01-24 ENCOUNTER — Encounter (HOSPITAL_COMMUNITY): Payer: Self-pay | Admitting: Internal Medicine

## 2022-01-24 DIAGNOSIS — Z7984 Long term (current) use of oral hypoglycemic drugs: Secondary | ICD-10-CM | POA: Diagnosis not present

## 2022-01-24 DIAGNOSIS — E119 Type 2 diabetes mellitus without complications: Secondary | ICD-10-CM

## 2022-01-24 DIAGNOSIS — D62 Acute posthemorrhagic anemia: Secondary | ICD-10-CM

## 2022-01-24 DIAGNOSIS — K259 Gastric ulcer, unspecified as acute or chronic, without hemorrhage or perforation: Secondary | ICD-10-CM

## 2022-01-24 DIAGNOSIS — K922 Gastrointestinal hemorrhage, unspecified: Secondary | ICD-10-CM | POA: Diagnosis not present

## 2022-01-24 HISTORY — PX: SUBMUCOSAL INJECTION: SHX5543

## 2022-01-24 HISTORY — PX: HEMOSTASIS CLIP PLACEMENT: SHX6857

## 2022-01-24 HISTORY — PX: ESOPHAGOGASTRODUODENOSCOPY (EGD) WITH PROPOFOL: SHX5813

## 2022-01-24 LAB — BASIC METABOLIC PANEL
Anion gap: 6 (ref 5–15)
BUN: 51 mg/dL — ABNORMAL HIGH (ref 8–23)
CO2: 21 mmol/L — ABNORMAL LOW (ref 22–32)
Calcium: 8.5 mg/dL — ABNORMAL LOW (ref 8.9–10.3)
Chloride: 114 mmol/L — ABNORMAL HIGH (ref 98–111)
Creatinine, Ser: 1.34 mg/dL — ABNORMAL HIGH (ref 0.61–1.24)
GFR, Estimated: 57 mL/min — ABNORMAL LOW (ref >60)
Glucose, Bld: 135 mg/dL — ABNORMAL HIGH (ref 70–99)
Potassium: 3.4 mmol/L — ABNORMAL LOW (ref 3.5–5.1)
Sodium: 141 mmol/L (ref 135–145)

## 2022-01-24 LAB — GLUCOSE, CAPILLARY
Glucose-Capillary: 139 mg/dL — ABNORMAL HIGH (ref 70–99)
Glucose-Capillary: 115 mg/dL — ABNORMAL HIGH (ref 70–99)
Glucose-Capillary: 108 mg/dL — ABNORMAL HIGH (ref 70–99)
Glucose-Capillary: 106 mg/dL — ABNORMAL HIGH (ref 70–99)
Glucose-Capillary: 238 mg/dL — ABNORMAL HIGH (ref 70–99)

## 2022-01-24 LAB — CBC
HCT: 28.4 % — ABNORMAL LOW (ref 39.0–52.0)
Hemoglobin: 8.9 g/dL — ABNORMAL LOW (ref 13.0–17.0)
MCH: 25.9 pg — ABNORMAL LOW (ref 26.0–34.0)
MCHC: 31.3 g/dL (ref 30.0–36.0)
MCV: 82.8 fL (ref 80.0–100.0)
Platelets: 195 10*3/uL (ref 150–400)
RBC: 3.43 MIL/uL — ABNORMAL LOW (ref 4.22–5.81)
RDW: 19.4 % — ABNORMAL HIGH (ref 11.5–15.5)
WBC: 7 10*3/uL (ref 4.0–10.5)
nRBC: 0 % (ref 0.0–0.2)

## 2022-01-24 LAB — CBG MONITORING, ED: Glucose-Capillary: 120 mg/dL — ABNORMAL HIGH (ref 70–99)

## 2022-01-24 LAB — ABO/RH: ABO/RH(D): O POS

## 2022-01-24 SURGERY — ESOPHAGOGASTRODUODENOSCOPY (EGD) WITH PROPOFOL
Anesthesia: Monitor Anesthesia Care

## 2022-01-24 MED ORDER — FENTANYL CITRATE (PF) 100 MCG/2ML IJ SOLN
50.0000 ug | Freq: Once | INTRAMUSCULAR | Status: AC
  Administered 2022-01-24: 50 ug via INTRAVENOUS

## 2022-01-24 MED ORDER — PROPOFOL 10 MG/ML IV BOLUS
INTRAVENOUS | Status: DC | PRN
  Administered 2022-01-24 (×3): 20 mg via INTRAVENOUS

## 2022-01-24 MED ORDER — PROPOFOL 500 MG/50ML IV EMUL
INTRAVENOUS | Status: AC
  Filled 2022-01-24: qty 50

## 2022-01-24 MED ORDER — POTASSIUM CHLORIDE 10 MEQ/100ML IV SOLN
10.0000 meq | INTRAVENOUS | Status: AC
  Administered 2022-01-24 (×2): 10 meq via INTRAVENOUS
  Filled 2022-01-24 (×2): qty 100

## 2022-01-24 MED ORDER — LACTATED RINGERS IV SOLN
INTRAVENOUS | Status: AC | PRN
  Administered 2022-01-24: 20 mL/h via INTRAVENOUS

## 2022-01-24 MED ORDER — PROPOFOL 500 MG/50ML IV EMUL
INTRAVENOUS | Status: DC | PRN
  Administered 2022-01-24: 125 ug/kg/min via INTRAVENOUS

## 2022-01-24 MED ORDER — SODIUM CHLORIDE (PF) 0.9 % IJ SOLN
PREFILLED_SYRINGE | INTRAMUSCULAR | Status: DC | PRN
  Administered 2022-01-24: 3 mL

## 2022-01-24 MED ORDER — SODIUM CHLORIDE 0.9 % IV SOLN: INTRAVENOUS | Status: DC

## 2022-01-24 MED ORDER — LIDOCAINE 2% (20 MG/ML) 5 ML SYRINGE
INTRAMUSCULAR | Status: DC | PRN
  Administered 2022-01-24: 100 mg via INTRAVENOUS

## 2022-01-24 MED ORDER — FENTANYL CITRATE (PF) 100 MCG/2ML IJ SOLN
INTRAMUSCULAR | Status: AC
  Filled 2022-01-24: qty 2

## 2022-01-24 MED ORDER — ONDANSETRON HCL 4 MG/2ML IJ SOLN
INTRAMUSCULAR | Status: DC | PRN
  Administered 2022-01-24: 4 mg via INTRAVENOUS

## 2022-01-24 MED ORDER — FENTANYL CITRATE (PF) 100 MCG/2ML IJ SOLN
25.0000 ug | Freq: Once | INTRAMUSCULAR | Status: AC
  Administered 2022-01-24: 25 ug via INTRAVENOUS

## 2022-01-24 MED ORDER — EPINEPHRINE 1 MG/10ML IJ SOSY
PREFILLED_SYRINGE | INTRAMUSCULAR | Status: AC
  Filled 2022-01-24: qty 10

## 2022-01-24 MED ORDER — METOCLOPRAMIDE HCL 5 MG/ML IJ SOLN
5.0000 mg | Freq: Once | INTRAMUSCULAR | Status: AC
  Administered 2022-01-24: 5 mg via INTRAVENOUS
  Filled 2022-01-24: qty 2

## 2022-01-24 SURGICAL SUPPLY — 15 items

## 2022-01-24 NOTE — ED Notes (Signed)
Pt. CBG 120, RN made aware.

## 2022-01-24 NOTE — Progress Notes (Signed)
[name] [name] has upper abdominal pain and nausea after waking from anesthesia. It is from the submucosal epinephrine used during the endoscopy.  He was given ondansetron and fentanyl in the post-op area.  These symptoms will slowly improve in several hours as the epinephrine dissipates, then he will start to feel better.  Regular diet order was written, but it will; be changed to clear liquids and advance as tolerated to regular diet.  - H. Loletha Carrow, MD

## 2022-01-24 NOTE — Anesthesia Postprocedure Evaluation (Signed)
Anesthesia Post Note  Patient: [name] [name]  Procedure(s) Performed: ESOPHAGOGASTRODUODENOSCOPY (EGD) WITH PROPOFOL HEMOSTASIS CLIP PLACEMENT SUBMUCOSAL INJECTION     Patient location during evaluation: PACU Anesthesia Type: MAC Level of consciousness: awake and alert Pain management: pain level controlled Vital Signs Assessment: post-procedure vital signs reviewed and stable Respiratory status: spontaneous breathing, nonlabored ventilation, respiratory function stable and patient connected to nasal cannula oxygen Cardiovascular status: stable and blood pressure returned to baseline Postop Assessment: no apparent nausea or vomiting Anesthetic complications: no   No notable events documented.  Last Vitals:  Vitals:   01/24/22 1640 01/24/22 1702  BP: (!) 135/43 (!) 147/80  Pulse: 71 61  Resp: (!) 23 18  Temp:  36.7 C  SpO2: 98% 97%    Last Pain:  Vitals:   01/24/22 1702  TempSrc: Oral  PainSc:                  Mckaila Duffus

## 2022-01-24 NOTE — Interval H&P Note (Signed)
History and Physical Interval Note:  01/24/2022 2:46 PM  [name] [name]  has presented today for surgery, with the diagnosis of melena, anemia, 7 days s/p benign gastric polypectomy on xarelto.  The various methods of treatment have been discussed with the patient and family. After consideration of risks, benefits and other options for treatment, the patient has consented to  Procedure(s): ESOPHAGOGASTRODUODENOSCOPY (EGD) WITH PROPOFOL (N/A) as a surgical intervention.  The patient's history has been reviewed, patient examined, no change in status, stable for surgery.  I have reviewed the patient's chart and labs.  Questions were answered to the patient's satisfaction.     [name]

## 2022-01-24 NOTE — Interval H&P Note (Signed)
History and Physical Interval Note:  01/24/2022 3:34 PM  [name] [name]  has presented today for surgery, with the diagnosis of melena, anemia, 7 days s/p benign gastric polypectomy on xarelto.  The various methods of treatment have been discussed with the patient and family. After consideration of risks, benefits and other options for treatment, the patient has consented to  Procedure(s): ESOPHAGOGASTRODUODENOSCOPY (EGD) WITH PROPOFOL (N/A) as a surgical intervention.  The patient's history has been reviewed, patient examined, no change in status, stable for surgery.  I have reviewed the patient's chart and labs.  Questions were answered to the patient's satisfaction.     [name]

## 2022-01-24 NOTE — Anesthesia Preprocedure Evaluation (Addendum)
Anesthesia Evaluation  Patient identified by MRN, date of birth, ID band Patient awake    Reviewed: Allergy & Precautions, NPO status , Patient's Chart, lab work & pertinent test results, reviewed documented beta blocker date and time   History of Anesthesia Complications Negative for: history of anesthetic complications  Airway Mallampati: II  TM Distance: >3 FB Neck ROM: Full    Dental  (+) Chipped, Dental Advisory Given   Pulmonary sleep apnea (does not use CPAP, uses dental appliance) ,    breath sounds clear to auscultation       Cardiovascular hypertension, Pt. on medications and Pt. on home beta blockers (-) angina+CHF  + dysrhythmias (s/p ablation) Atrial Fibrillation + Valvular Problems/Murmurs (mild MR)  Rhythm:Regular Rate:Normal  '21 ECHO: EF 60- 65%. The LV has normal function, no regional wall motion abnormalities. RVF is normal, mild MR   Neuro/Psych  Headaches, TIA   GI/Hepatic Neg liver ROS, GERD  Medicated and Controlled,  Endo/Other  diabetes (glu 108), Oral Hypoglycemic AgentsHypothyroidism BMI 34.5  Renal/GU Renal InsufficiencyRenal disease     Musculoskeletal  (+) Arthritis ,   Abdominal (+) + obese,   Peds  Hematology  (+) Blood dyscrasia (Hb 8.9), anemia , xarelto   Anesthesia Other Findings   Reproductive/Obstetrics                            Anesthesia Physical Anesthesia Plan  ASA: 3  Anesthesia Plan: MAC   Post-op Pain Management: Minimal or no pain anticipated   Induction:   PONV Risk Score and Plan: 1 and Ondansetron and Treatment may vary due to age or medical condition  Airway Management Planned: Natural Airway and Nasal Cannula  Additional Equipment: None  Intra-op Plan:   Post-operative Plan:   Informed Consent: I have reviewed the patients History and Physical, chart, labs and discussed the procedure including the risks, benefits and  alternatives for the proposed anesthesia with the patient or authorized representative who has indicated his/her understanding and acceptance.     Dental advisory given  Plan Discussed with: Surgeon and CRNA  Anesthesia Plan Comments:        Anesthesia Quick Evaluation

## 2022-01-24 NOTE — Consult Note (Addendum)
Consultation  Referring Provider:   [facility] Primary Care Physician:  Donnajean Lopes, MD Primary Gastroenterologist:  Dr. Hilarie Fredrickson       Reason for Consultation:   Melena, acute on chronic anemia 7 days status post benign gastric polypectomy while on Xarelto   Impression    Melena without hemodynamic compromise 7 days status post EGD with benign gastric polypectomy, Xarelto started 5 days ago. 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 -Protonix continuous bolus with drip for 72 hours then transition to PPI BID --Continue to monitor H&H with transfusion as needed to maintain hemoglobin greater than 7. -Plan for EGD today, I thoroughly discussed the procedure to include nature, alternatives, benefits, and risks including but not limited to bleeding, perforation, infection, anesthesia/cardiac and pulmonary complications. Patient provides understanding and gave verbal consent to proceed. -Would give 5 mg IV Reglan 30 minutes prior to endoscopy - continue to hold xarelto, timing of restarting per Dr. Loletha Carrow after endoscopy, in NSR at this time, TEE 2021 without thrombus.   Thank you for your kind consultation, we will continue to follow.         HPI:    [name] [name] is a 71 y.o. male with past medical history significant for hypertension, type 2 diabetes with CKD stage III, sleep apnea on mouthguard, history of prostate cancer hyperlipidemia, atrial fibrillation status post ablation and cardioversion on Xarelto, chronic diastolic heart failure, TIA 2015, history of GERD remote history of gastric ulcer, diverticulosis and colon polyps presents with melena status post EGD 01/17/2022 for IDA.  01/05/2022 office visit with NP Berniece Pap in our office for evaluation of IDA, due to unremarkable colonoscopy May 2022, treated with EGD 01/17/2022 with gastric polyp removed and prophylactic clip placed.  Patient called the office 01/23/2022 after being back on Xarelto for 4 days with soft black stools, nausea.  Patient was advised to stop Xarelto, continue PPI and presented to the ER. In the ER patient had hemoglobin of 8.1 compared to 11.62 weeks prior.  Patient had elevation of BUN to 54 from 18, creatinine from 1.24-1.33.  Albumin decreased at 2.7, total protein 5.7, unremarkable liver function.  INR slightly elevated at 1.4 in setting of Xarelto. Status post 1 unit 01/24/2022 with hemoglobin improving to 8.9 this morning.  Patient lying in bed with Horris Latino, his wife at bedside, provides some of the history. Patient denies ever having GI bleeding like this prior. Per wife Sunday at church she began to feel unwell, felt dizzy possible syncopal episode said to sit down.  Went home took a nap and woke up with having large-volume black sticky stools 3 times.  During this patient was having some periumbilical abdominal cramping  and discomfort. Last bowel movement was yesterday prior to coming to the ER.  Has not had any further black stools here. Patient's been having stomach bloating and feeling full, denies nausea, vomiting, fever, chills, shortness of breath or chest pain. Denies NSAID, alcohol use. Last dose of Xarelto was Saturday evening. Horris Latino  patient's wife states he had endoscopy with Dr. Oletta Lamas at [place], looking at notes able to see 2006, had admission 04/07/2005 with Dr. Penelope Coop for black tarry stools, subsequent leiomyoma resected.  01/17/2022 EGD with Dr. Hilarie Fredrickson for iron deficiency anemia, normal colon May 2022 revealed normal esophagus, single gastric polyp resected and retrieved clip in place, gastritis, normal duodenum.  Pathology showed hyperplastic polyp no malignancy or dysplasia, mild chronic gastritis negative for H. pylori or metaplasia, benign small bowel no celiac's. 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 08/25/2020 colonoscopy 8 tubular adenomatous polyps removed recall 3 years   Abnormal ED labs: Abnormal Labs Reviewed  COMPREHENSIVE METABOLIC PANEL - Abnormal; Notable for the following components:      Result Value   Chloride 112 (*)    CO2 17 (*)    Glucose, Bld 183 (*)    BUN 54 (*)    Creatinine, Ser 1.33 (*)    Total Protein 5.1 (*)    Albumin 2.7 (*)    GFR, Estimated 57 (*)    All other components within normal limits  CBC WITH DIFFERENTIAL/PLATELET - Abnormal; Notable for the following components:   RBC 3.30 (*)    Hemoglobin 8.1 (*)    HCT 27.1 (*)    MCH 24.5 (*)    MCHC 29.9 (*)    RDW 20.4 (*)    Abs Immature Granulocytes 0.10 (*)    All other components within normal limits  PROTIME-INR - Abnormal; Notable for the following components:   Prothrombin Time 17.3 (*)    INR 1.4 (*)    All other components within normal limits  CBC - Abnormal; Notable for the following components:   RBC 3.43 (*)    Hemoglobin 8.9 (*)    HCT 28.4 (*)    MCH 25.9 (*)    RDW 19.4 (*)    All other components within normal limits  GLUCOSE, CAPILLARY -  Abnormal; Notable for the following components:   Glucose-Capillary 139 (*)    All other components within normal limits  GLUCOSE, CAPILLARY - Abnormal; Notable for the following components:   Glucose-Capillary 115 (*)    All other components within normal limits  POC OCCULT BLOOD, ED - Abnormal; Notable for the following components:   Fecal Occult Bld POSITIVE (*)    All other components within normal limits  CBG MONITORING, ED - Abnormal; Notable for the following components:   Glucose-Capillary 120 (*)    All other components within normal limits     Past Medical History:  Diagnosis Date   Arthritis    OA   Atrial flutter ([place])    a. s/p RFCA 8/13   Cancer ([place]) 2010   prostate   Chronic combined systolic and diastolic CHF (congestive heart failure) ([place])    a. LVEF previously 35% felt to be due tachycardia;  b. 02/2013 Echo: EF 50-55%, no rwma, mildly dil LA/RA, mild to mod MR. C 11/2014 echo - ef 60-65%, unable to determine DD  CKD (chronic kidney disease), stage III (HCC)    hx of a/c renal failure during episode of diverticulitis 9/13 in [place], [place]   Diabetes mellitus ([place])    TYPE 2    Diverticulitis    Diverticulosis    Dyslipidemia    Dysrhythmia    Erectile dysfunction    Gastric ulcer    s/p prior surgery   GERD (gastroesophageal reflux disease)    Headache    History of colonic diverticulitis    History of prostate cancer    Hypertension    Hypothyroid    Kidney stones    Microcytic anemia    Obesities, morbid ([place])    Obstructive sleep apnea    noncompliant with CPAP   Osteoporosis    PAF (paroxysmal atrial fibrillation) ([place])    a. failed DCCV and multiple anti-arrhythmic drugs (multaq/flecainide);   b. s/p  PVI isolation with ablation of AFib 8/13; c. repeat PVI 01/2013;  d. 02/2013 repeat DCCV->Amio load/xarelto.   Peptic ulcer disease 1994   Renal calculi    Sleep apnea 2015   has not had sleep apnea since heart intervention    Thrombocytopenia ([place]) 1992   idopathic-treated with danazol   Tubular adenoma of colon     Surgical History:  He  has a past surgical history that includes benign stomach tumor removal (2000); Laparoscopic retropubic prostatectomy (02/2007); kidney stone removal; TEE without cardioversion (08/25/2011); Cardioversion (08/25/2011); TEE without cardioversion (11/09/2011); Atrial fibrillation ablation (12/06/11; 02/05/2013); TEE without cardioversion (N/A, 02/05/2013); Cardioversion (N/A, 03/05/2013); Esophagogastroduodenoscopy (N/A, 04/09/2013); atrial fibrillation ablation (N/A, 12/06/2011); atrial fibrillation ablation (N/A, 02/05/2013); Cardiac catheterization (N/A, 11/24/2014); convergent afib abaltion [facility] 02/26/15 (02/26/2015); ORIF ankle fracture (Right, 10/2015); ORIF ankle fracture (Right, 11/03/2015); Wrist surgery (Right, 1977); Knee arthroscopy (Bilateral, 1979); Total thyroidectomy (2009); Carpal tunnel with cubital tunnel (Right, 02/25/2020); TEE without cardioversion (N/A, 03/25/2020); Cardioversion (N/A, 03/25/2020); Cardioversion (N/A, 06/02/2020); and Upper gastrointestinal endoscopy. Family History:  His family history includes Atrial fibrillation in his brother; Colon polyps in his brother, sister, and sister; Diabetes in his brother; Emphysema in his mother; Lung cancer in his father and sister; Throat cancer in his sister. Social History:   reports that he has never smoked. He has never used smokeless tobacco. He reports that he does not drink alcohol and does not use drugs.  Prior to Admission medications   Medication Sig Start Date End Date Taking? Authorizing Provider  acetaminophen (TYLENOL) 500 MG tablet Take 1,000 mg by mouth every 6 (six) hours as needed for moderate pain or headache.    [provider]  ALPRAZolam Duanne Moron) 1 MG tablet Take 1 tablet (1 mg total) by mouth 3 (three) times daily as needed for anxiety. Patient taking differently: Take 1 mg by mouth daily  as needed for anxiety. 02/27/20   Roseanne Kaufman, MD  bismuth subsalicylate (PEPTO BISMOL) 262 MG/15ML suspension Take 30 mLs by mouth every 6 (six) hours as needed for indigestion or diarrhea or loose stools.    [provider]  carvedilol (COREG) 12.5 MG tablet TAKE 3 TABLETS BY MOUTH TWICE DAILY 11/22/21   Skeet Latch, MD  DULoxetine (CYMBALTA) 60 MG capsule Take 60 mg by mouth daily. 03/27/20   [provider]  esomeprazole (NEXIUM) 40 MG capsule Take 40 mg by mouth daily.     [provider]  FOLIC ACID PO Take 6,195 mcg by mouth daily.    [provider]  Glucosamine HCl 1500 MG TABS Take 1,500 mg by mouth in  the morning and at bedtime.    [provider]  HYDROmorphone (DILAUDID) 2 MG tablet Take 1 tablet (2 mg total) by mouth every 4 (four) hours as needed for severe pain. 09/05/21   Orpah Greek, MD  JARDIANCE 25 MG TABS tablet Take 25 mg by mouth daily. 06/12/19   [provider]  levothyroxine (SYNTHROID) 175 MCG tablet Take 175 mcg by mouth daily. 07/01/19   [provider]  losartan (COZAAR) 50 MG tablet Take 50 mg by mouth every evening. 07/02/19   [provider]  LYSINE PO Take 1 tablet by mouth daily as needed (cold sores).    [provider]  Meclizine HCl (BONINE PO) Take 1 tablet by mouth daily as needed (nausea).    [provider]  metFORMIN (GLUCOPHAGE-XR) 500 MG 24 hr tablet Take 1,000 mg by mouth 2 (two) times daily. 07/15/19   [provider]  MULTAQ 400 MG tablet TAKE 1 TABLET TWICE DAILY  WITH MEALS Patient taking differently: Take 400 mg by mouth 2 (two) times daily with a meal. 11/16/20   Skeet Latch, MD  Multiple Vitamin (MULTIVITAMIN WITH MINERALS) TABS tablet Take 1 tablet by mouth daily. One-A-Day for Men    [provider]  Multiple Vitamins-Minerals (PRESERVISION AREDS 2) CAPS Take 1 capsule by mouth 2 (two) times daily.    [provider]  mupirocin ointment (BACTROBAN) 2 % Apply 1 application topically 2 (two) times daily as needed (skin wounds). 01/30/20   [provider]  ondansetron (ZOFRAN) 4 MG tablet Take 4 mg by mouth 2 (two) times daily as needed for nausea.  01/14/20   [provider]  [facility] VERIO test strip 1 each daily. 02/21/20   [provider]  polycarbophil (FIBERCON) 625 MG tablet Take 1,250 mg by mouth every evening.    [provider]  potassium chloride SA (KLOR-CON) 20 MEQ tablet Take 40 mEq by mouth 2 (two) times daily. Patient not taking: Reported on 01/05/2022    [provider]  Probiotic Product (PROBIOTIC PO) Take 1 capsule by mouth at bedtime.    [provider]  rivaroxaban (XARELTO) 20 MG TABS tablet TAKE 1 TABLET DAILY WITH   SUPPER Patient not taking: Reported on 01/17/2022 07/05/21   Skeet Latch, MD  rosuvastatin (CRESTOR) 10 MG tablet Take 1 tablet by mouth once daily 09/14/21   Skeet Latch, MD  tamsulosin (FLOMAX) 0.4 MG CAPS capsule Take 0.4 mg by mouth daily as needed (kidney stones).  06/10/19   [provider]  TRULICITY 6.27 OJ/5.0KX SOPN Inject 0.75 mg into the skin once a week. Thursday 06/22/21   [provider]  vitamin B-12 (CYANOCOBALAMIN) 1000 MCG tablet Take 1,000 mcg by mouth daily.    [provider]  XARELTO 15 MG TABS tablet Take 15 mg by mouth daily. 08/23/21   [provider]    Current Facility-Administered Medications  Medication Dose Route Frequency Provider Last Rate Last Admin   0.9 %  sodium chloride infusion  10 mL/hr Intravenous Once Hayden Rasmussen, MD       acetaminophen (TYLENOL) tablet 650 mg  650 mg Oral Q6H PRN Lenore Cordia, MD   650 mg at 01/24/22 3818   Or   acetaminophen (TYLENOL) suppository 650 mg  650 mg Rectal Q6H PRN Lenore Cordia, MD       carvedilol (COREG) tablet 37.5 mg  37.5 mg Oral BID WC Lenore Cordia, MD  DULoxetine (CYMBALTA) DR  capsule 60 mg  60 mg Oral Daily Patel, Vishal R, MD       insulin aspart (novoLOG) injection 0-9 Units  0-9 Units Subcutaneous Q4H Lenore Cordia, MD   1 Units at 01/24/22 0506   levothyroxine (SYNTHROID) tablet 175 mcg  175 mcg Oral Daily Lenore Cordia, MD       ondansetron (ZOFRAN) tablet 4 mg  4 mg Oral Q6H PRN Lenore Cordia, MD       Or   ondansetron (ZOFRAN) injection 4 mg  4 mg Intravenous Q6H PRN Lenore Cordia, MD       pantoprozole (PROTONIX) 80 mg /NS 100 mL infusion  8 mg/hr Intravenous Continuous Hayden Rasmussen, MD 10 mL/hr at 01/24/22 0716 8 mg/hr at 01/24/22 0716   rosuvastatin (CRESTOR) tablet 10 mg  10 mg Oral Daily Lenore Cordia, MD       sodium chloride flush (NS) 0.9 % injection 3 mL  3 mL Intravenous Q12H Lenore Cordia, MD        Allergies as of 01/23/2022 - Review Complete 01/23/2022  Allergen Reaction Noted   Bystolic [nebivolol hcl] Swelling 08/25/2011   Calcium channel blockers Swelling 08/25/2011   Phenergan [promethazine hcl] Itching 08/25/2011   Beta adrenergic blockers Swelling 05/13/2020   Cardizem [diltiazem] Other (See Comments) 03/23/2020   Norvasc [amlodipine] Other (See Comments) 05/13/2020   Promethazine Itching and Other (See Comments) 09/02/2013   Aspirin Other (See Comments) 08/25/2011   Nsaids  08/25/2011   Prednisone Other (See Comments) 08/25/2011    Review of Systems:    Constitutional: No weight loss, fever, chills, weakness or fatigue HEENT: Eyes: No change in vision               Ears, Nose, Throat:  No change in hearing or congestion Skin: No rash or itching Cardiovascular: No chest pain, chest pressure or palpitations   Respiratory: No SOB or cough Gastrointestinal: See HPI and otherwise negative Genitourinary: No dysuria or change in urinary frequency Neurological: No headache, dizziness or syncope Musculoskeletal: No new muscle or joint pain Hematologic: No bleeding or bruising Psychiatric: No history of depression or  anxiety     Physical Exam:  Vital signs in last 24 hours: Temp:  [98.1 F (36.7 C)-99 F (37.2 C)] 98.1 F (36.7 C) (10/16 0804) Pulse Rate:  [67-94] 67 (10/16 0804) Resp:  [12-30] 16 (10/16 0804) BP: (121-152)/(59-86) 133/67 (10/16 0804) SpO2:  [96 %-100 %] 96 % (10/16 0804) Weight:  [99.8 kg] 99.8 kg (10/16 0339)   Last BM recorded by nurses in past 5 days No data recorded  General:   Pleasant, well developed male in no acute distress Head:  Normocephalic and atraumatic. Eyes: sclerae anicteric,conjunctive pink  Heart:  regular rate and rhythm Pulm: Clear anteriorly; no wheezing Abdomen:  Soft, Obese AB, Active bowel sounds. No tenderness . Without guarding and Without rebound, No organomegaly appreciated. Extremities:  With 1-2+ edema. Msk:  Symmetrical without gross deformities. Peripheral pulses intact.  Neurologic:  Alert and  oriented x4;  No focal deficits.  Skin:   Dry and intact without significant lesions or rashes. Psychiatric:  Cooperative. Normal mood and affect.  LAB RESULTS: Recent Labs    01/23/22 2038 01/24/22 0756  WBC 10.0 7.0  HGB 8.1* 8.9*  HCT 27.1* 28.4*  PLT 220 195   BMET Recent Labs    01/23/22 2038  NA 140  K 3.7  CL 112*  CO2  17*  GLUCOSE 183*  BUN 54*  CREATININE 1.33*  CALCIUM 9.0   LFT Recent Labs    01/23/22 2038  PROT 5.1*  ALBUMIN 2.7*  AST 16  ALT 15  ALKPHOS 57  BILITOT 0.6   PT/INR Recent Labs    01/23/22 2038  LABPROT 17.3*  INR 1.4*    STUDIES: No results found.   Vladimir Crofts  01/24/2022, 8:11 AM   I have taken an interval history, thoroughly reviewed the chart and examined the patient. I agree with the Advanced Practitioner's note, impression and recommendations, and have recorded additional findings, impressions and recommendations below. I performed a substantive portion of this encounter (>50% time spent), including a complete performance of the medical decision making.  My additional  thoughts are as follows:  I also discussed this patient's case with Dr. Hilarie Fredrickson earlier today.  We strongly suspect this bleeding has been coming from the gastric polypectomy site, despite it having been clipped at the time of polypectomy. Hemoglobin improved appropriately with transfusion last evening, patient is hemodynamically stable, alert, conversational and comfortable.  He has no chest pain dyspnea or abdominal pain.  His vital signs are normal and he is in sinus rhythm.  Plan is for an upper endoscopy with endoscopic control of the bleeding site.  He was agreeable after discussion of procedure and risks.  The benefits and risks of the planned procedure were described in detail with the patient or (when appropriate) their health care proxy.  Risks were outlined as including, but not limited to, bleeding, infection, perforation, adverse medication reaction leading to cardiac or pulmonary decompensation, pancreatitis (if ERCP).  The limitation of incomplete mucosal visualization was also discussed.  No guarantees or warranties were given.  Patient at increased risk for cardiopulmonary complications of procedure due to medical comorbidities.    [name] Office:[phone]

## 2022-01-24 NOTE — H&P (View-Only) (Signed)
Consultation  Referring Provider:   [facility] Primary Care Physician:  Donnajean Lopes, MD Primary Gastroenterologist:  Dr. Hilarie Fredrickson       Reason for Consultation:   Melena, acute on chronic anemia 7 days status post benign gastric polypectomy while on Xarelto   Impression    Melena without hemodynamic compromise 7 days status post EGD with benign gastric polypectomy, Xarelto started 5 days ago. 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 -Protonix continuous bolus with drip for 72 hours then transition to PPI BID --Continue to monitor H&H with transfusion as needed to maintain hemoglobin greater than 7. -Plan for EGD today, I thoroughly discussed the procedure to include nature, alternatives, benefits, and risks including but not limited to bleeding, perforation, infection, anesthesia/cardiac and pulmonary complications. Patient provides understanding and gave verbal consent to proceed. -Would give 5 mg IV Reglan 30 minutes prior to endoscopy - continue to hold xarelto, timing of restarting per Dr. Loletha Carrow after endoscopy, in NSR at this time, TEE 2021 without thrombus.   Thank you for your kind consultation, we will continue to follow.         HPI:    [name] [name] is a 71 y.o. male with past medical history significant for hypertension, type 2 diabetes with CKD stage III, sleep apnea on mouthguard, history of prostate cancer hyperlipidemia, atrial fibrillation status post ablation and cardioversion on Xarelto, chronic diastolic heart failure, TIA 2015, history of GERD remote history of gastric ulcer, diverticulosis and colon polyps presents with melena status post EGD 01/17/2022 for IDA.  01/05/2022 office visit with NP Berniece Pap in our office for evaluation of IDA, due to unremarkable colonoscopy May 2022, treated with EGD 01/17/2022 with gastric polyp removed and prophylactic clip placed.  Patient called the office 01/23/2022 after being back on Xarelto for 4 days with soft black stools, nausea.  Patient was advised to stop Xarelto, continue PPI and presented to the ER. In the ER patient had hemoglobin of 8.1 compared to 11.62 weeks prior.  Patient had elevation of BUN to 54 from 18, creatinine from 1.24-1.33.  Albumin decreased at 2.7, total protein 5.7, unremarkable liver function.  INR slightly elevated at 1.4 in setting of Xarelto. Status post 1 unit 01/24/2022 with hemoglobin improving to 8.9 this morning.  Patient lying in bed with [name] [name], his wife at bedside, provides some of the history. Patient denies ever having GI bleeding like this prior. Per wife Sunday at church she began to feel unwell, felt dizzy possible syncopal episode said to sit down.  Went home took a nap and woke up with having large-volume black sticky stools 3 times.  During this patient was having some periumbilical abdominal cramping  and discomfort. Last bowel movement was yesterday prior to coming to the ER.  Has not had any further black stools here. Patient's been having stomach bloating and feeling full, denies nausea, vomiting, fever, chills, shortness of breath or chest pain. Denies NSAID, alcohol use. Last dose of Xarelto was Saturday evening. [name] [name]  patient's wife states he had endoscopy with Dr. Oletta Lamas at [facility], looking at notes able to see 2006, had admission 04/07/2005 with Dr. Penelope Coop for black tarry stools, subsequent leiomyoma resected.  01/17/2022 EGD with Dr. Hilarie Fredrickson for iron deficiency anemia, normal colon May 2022 revealed normal esophagus, single gastric polyp resected and retrieved clip in place, gastritis, normal duodenum.  Pathology showed hyperplastic polyp no malignancy or dysplasia, mild chronic gastritis negative for H. pylori or metaplasia, benign small bowel no celiac's. 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 08/25/2020 colonoscopy 8 tubular adenomatous polyps removed recall 3 years   Abnormal ED labs: Abnormal Labs Reviewed  COMPREHENSIVE METABOLIC PANEL - Abnormal; Notable for the following components:      Result Value   Chloride 112 (*)    CO2 17 (*)    Glucose, Bld 183 (*)    BUN 54 (*)    Creatinine, Ser 1.33 (*)    Total Protein 5.1 (*)    Albumin 2.7 (*)    GFR, Estimated 57 (*)    All other components within normal limits  CBC WITH DIFFERENTIAL/PLATELET - Abnormal; Notable for the following components:   RBC 3.30 (*)    Hemoglobin 8.1 (*)    HCT 27.1 (*)    MCH 24.5 (*)    MCHC 29.9 (*)    RDW 20.4 (*)    Abs Immature Granulocytes 0.10 (*)    All other components within normal limits  PROTIME-INR - Abnormal; Notable for the following components:   Prothrombin Time 17.3 (*)    INR 1.4 (*)    All other components within normal limits  CBC - Abnormal; Notable for the following components:   RBC 3.43 (*)    Hemoglobin 8.9 (*)    HCT 28.4 (*)    MCH 25.9 (*)    RDW 19.4 (*)    All other components within normal limits  GLUCOSE, CAPILLARY -  Abnormal; Notable for the following components:   Glucose-Capillary 139 (*)    All other components within normal limits  GLUCOSE, CAPILLARY - Abnormal; Notable for the following components:   Glucose-Capillary 115 (*)    All other components within normal limits  POC OCCULT BLOOD, ED - Abnormal; Notable for the following components:   Fecal Occult Bld POSITIVE (*)    All other components within normal limits  CBG MONITORING, ED - Abnormal; Notable for the following components:   Glucose-Capillary 120 (*)    All other components within normal limits     Past Medical History:  Diagnosis Date   Arthritis    OA   Atrial flutter ([place])    a. s/p RFCA 8/13   Cancer ([place]) 2010   prostate   Chronic combined systolic and diastolic CHF (congestive heart failure) ([place])    a. LVEF previously 35% felt to be due tachycardia;  b. 02/2013 Echo: EF 50-55%, no rwma, mildly dil LA/RA, mild to mod MR. C 11/2014 echo - ef 60-65%, unable to determine DD  CKD (chronic kidney disease), stage III (HCC)    hx of a/c renal failure during episode of diverticulitis 9/13 in [place]   Diabetes mellitus ([place])    TYPE 2    Diverticulitis    Diverticulosis    Dyslipidemia    Dysrhythmia    Erectile dysfunction    Gastric ulcer    s/p prior surgery   GERD (gastroesophageal reflux disease)    Headache    History of colonic diverticulitis    History of prostate cancer    Hypertension    Hypothyroid    Kidney stones    Microcytic anemia    Obesities, morbid ([place])    Obstructive sleep apnea    noncompliant with CPAP   Osteoporosis    PAF (paroxysmal atrial fibrillation) ([place])    a. failed DCCV and multiple anti-arrhythmic drugs (multaq/flecainide);   b. s/p  PVI isolation with ablation of AFib 8/13; c. repeat PVI 01/2013;  d. 02/2013 repeat DCCV->Amio load/xarelto.   Peptic ulcer disease 1994   Renal calculi    Sleep apnea 2015   has not had sleep apnea since heart intervention    Thrombocytopenia ([place]) 1992   idopathic-treated with danazol   Tubular adenoma of colon     Surgical History:  He  has a past surgical history that includes benign stomach tumor removal (2000); Laparoscopic retropubic prostatectomy (02/2007); kidney stone removal; TEE without cardioversion (08/25/2011); Cardioversion (08/25/2011); TEE without cardioversion (11/09/2011); Atrial fibrillation ablation (12/06/11; 02/05/2013); TEE without cardioversion (N/A, 02/05/2013); Cardioversion (N/A, 03/05/2013); Esophagogastroduodenoscopy (N/A, 04/09/2013); atrial fibrillation ablation (N/A, 12/06/2011); atrial fibrillation ablation (N/A, 02/05/2013); Cardiac catheterization (N/A, 11/24/2014); convergent afib abaltion [facility] 02/26/15 (02/26/2015); ORIF ankle fracture (Right, 10/2015); ORIF ankle fracture (Right, 11/03/2015); Wrist surgery (Right, 1977); Knee arthroscopy (Bilateral, 1979); Total thyroidectomy (2009); Carpal tunnel with cubital tunnel (Right, 02/25/2020); TEE without cardioversion (N/A, 03/25/2020); Cardioversion (N/A, 03/25/2020); Cardioversion (N/A, 06/02/2020); and Upper gastrointestinal endoscopy. Family History:  His family history includes Atrial fibrillation in his brother; Colon polyps in his brother, sister, and sister; Diabetes in his brother; Emphysema in his mother; Lung cancer in his father and sister; Throat cancer in his sister. Social History:   reports that he has never smoked. He has never used smokeless tobacco. He reports that he does not drink alcohol and does not use drugs.  Prior to Admission medications   Medication Sig Start Date End Date Taking? Authorizing Provider  acetaminophen (TYLENOL) 500 MG tablet Take 1,000 mg by mouth every 6 (six) hours as needed for moderate pain or headache.    [provider]  ALPRAZolam Duanne Moron) 1 MG tablet Take 1 tablet (1 mg total) by mouth 3 (three) times daily as needed for anxiety. Patient taking differently: Take 1 mg by mouth daily  as needed for anxiety. 02/27/20   Roseanne Kaufman, MD  bismuth subsalicylate (PEPTO BISMOL) 262 MG/15ML suspension Take 30 mLs by mouth every 6 (six) hours as needed for indigestion or diarrhea or loose stools.    [provider]  carvedilol (COREG) 12.5 MG tablet TAKE 3 TABLETS BY MOUTH TWICE DAILY 11/22/21   Skeet Latch, MD  DULoxetine (CYMBALTA) 60 MG capsule Take 60 mg by mouth daily. 03/27/20   [provider]  esomeprazole (NEXIUM) 40 MG capsule Take 40 mg by mouth daily.     [provider]  FOLIC ACID PO Take 2,536 mcg by mouth daily.    [provider]  Glucosamine HCl 1500 MG TABS Take 1,500 mg by mouth in  the morning and at bedtime.    [provider]  HYDROmorphone (DILAUDID) 2 MG tablet Take 1 tablet (2 mg total) by mouth every 4 (four) hours as needed for severe pain. 09/05/21   Orpah Greek, MD  JARDIANCE 25 MG TABS tablet Take 25 mg by mouth daily. 06/12/19   [provider]  levothyroxine (SYNTHROID) 175 MCG tablet Take 175 mcg by mouth daily. 07/01/19   [provider]  losartan (COZAAR) 50 MG tablet Take 50 mg by mouth every evening. 07/02/19   [provider]  LYSINE PO Take 1 tablet by mouth daily as needed (cold sores).    [provider]  Meclizine HCl (BONINE PO) Take 1 tablet by mouth daily as needed (nausea).    [provider]  metFORMIN (GLUCOPHAGE-XR) 500 MG 24 hr tablet Take 1,000 mg by mouth 2 (two) times daily. 07/15/19   [provider]  MULTAQ 400 MG tablet TAKE 1 TABLET TWICE DAILY  WITH MEALS Patient taking differently: Take 400 mg by mouth 2 (two) times daily with a meal. 11/16/20   Skeet Latch, MD  Multiple Vitamin (MULTIVITAMIN WITH MINERALS) TABS tablet Take 1 tablet by mouth daily. One-A-Day for Men    [provider]  Multiple Vitamins-Minerals (PRESERVISION AREDS 2) CAPS Take 1 capsule by mouth 2 (two) times daily.    [provider]  mupirocin ointment (BACTROBAN) 2 % Apply 1 application topically 2 (two) times daily as needed (skin wounds). 01/30/20   [provider]  ondansetron (ZOFRAN) 4 MG tablet Take 4 mg by mouth 2 (two) times daily as needed for nausea.  01/14/20   [provider]  [facility] VERIO test strip 1 each daily. 02/21/20   [provider]  polycarbophil (FIBERCON) 625 MG tablet Take 1,250 mg by mouth every evening.    [provider]  potassium chloride SA (KLOR-CON) 20 MEQ tablet Take 40 mEq by mouth 2 (two) times daily. Patient not taking: Reported on 01/05/2022    [provider]  Probiotic Product (PROBIOTIC PO) Take 1 capsule by mouth at bedtime.    [provider]  rivaroxaban (XARELTO) 20 MG TABS tablet TAKE 1 TABLET DAILY WITH   SUPPER Patient not taking: Reported on 01/17/2022 07/05/21   Skeet Latch, MD  rosuvastatin (CRESTOR) 10 MG tablet Take 1 tablet by mouth once daily 09/14/21   Skeet Latch, MD  tamsulosin (FLOMAX) 0.4 MG CAPS capsule Take 0.4 mg by mouth daily as needed (kidney stones).  06/10/19   [provider]  TRULICITY 6.01 UX/3.2TF SOPN Inject 0.75 mg into the skin once a week. Thursday 06/22/21   [provider]  vitamin B-12 (CYANOCOBALAMIN) 1000 MCG tablet Take 1,000 mcg by mouth daily.    [provider]  XARELTO 15 MG TABS tablet Take 15 mg by mouth daily. 08/23/21   [provider]    Current Facility-Administered Medications  Medication Dose Route Frequency Provider Last Rate Last Admin   0.9 %  sodium chloride infusion  10 mL/hr Intravenous Once Hayden Rasmussen, MD       acetaminophen (TYLENOL) tablet 650 mg  650 mg Oral Q6H PRN Lenore Cordia, MD   650 mg at 01/24/22 5732   Or   acetaminophen (TYLENOL) suppository 650 mg  650 mg Rectal Q6H PRN Lenore Cordia, MD       carvedilol (COREG) tablet 37.5 mg  37.5 mg Oral BID WC Lenore Cordia, MD  DULoxetine (CYMBALTA) DR  capsule 60 mg  60 mg Oral Daily Patel, Vishal R, MD       insulin aspart (novoLOG) injection 0-9 Units  0-9 Units Subcutaneous Q4H Lenore Cordia, MD   1 Units at 01/24/22 0506   levothyroxine (SYNTHROID) tablet 175 mcg  175 mcg Oral Daily Lenore Cordia, MD       ondansetron (ZOFRAN) tablet 4 mg  4 mg Oral Q6H PRN Lenore Cordia, MD       Or   ondansetron (ZOFRAN) injection 4 mg  4 mg Intravenous Q6H PRN Lenore Cordia, MD       pantoprozole (PROTONIX) 80 mg /NS 100 mL infusion  8 mg/hr Intravenous Continuous Hayden Rasmussen, MD 10 mL/hr at 01/24/22 0716 8 mg/hr at 01/24/22 0716   rosuvastatin (CRESTOR) tablet 10 mg  10 mg Oral Daily Lenore Cordia, MD       sodium chloride flush (NS) 0.9 % injection 3 mL  3 mL Intravenous Q12H Lenore Cordia, MD        Allergies as of 01/23/2022 - Review Complete 01/23/2022  Allergen Reaction Noted   Bystolic [nebivolol hcl] Swelling 08/25/2011   Calcium channel blockers Swelling 08/25/2011   Phenergan [promethazine hcl] Itching 08/25/2011   Beta adrenergic blockers Swelling 05/13/2020   Cardizem [diltiazem] Other (See Comments) 03/23/2020   Norvasc [amlodipine] Other (See Comments) 05/13/2020   Promethazine Itching and Other (See Comments) 09/02/2013   Aspirin Other (See Comments) 08/25/2011   Nsaids  08/25/2011   Prednisone Other (See Comments) 08/25/2011    Review of Systems:    Constitutional: No weight loss, fever, chills, weakness or fatigue HEENT: Eyes: No change in vision               Ears, Nose, Throat:  No change in hearing or congestion Skin: No rash or itching Cardiovascular: No chest pain, chest pressure or palpitations   Respiratory: No SOB or cough Gastrointestinal: See HPI and otherwise negative Genitourinary: No dysuria or change in urinary frequency Neurological: No headache, dizziness or syncope Musculoskeletal: No new muscle or joint pain Hematologic: No bleeding or bruising Psychiatric: No history of depression or  anxiety     Physical Exam:  Vital signs in last 24 hours: Temp:  [98.1 F (36.7 C)-99 F (37.2 C)] 98.1 F (36.7 C) (10/16 0804) Pulse Rate:  [67-94] 67 (10/16 0804) Resp:  [12-30] 16 (10/16 0804) BP: (121-152)/(59-86) 133/67 (10/16 0804) SpO2:  [96 %-100 %] 96 % (10/16 0804) Weight:  [99.8 kg] 99.8 kg (10/16 0339)   Last BM recorded by nurses in past 5 days No data recorded  General:   Pleasant, well developed male in no acute distress Head:  Normocephalic and atraumatic. Eyes: sclerae anicteric,conjunctive pink  Heart:  regular rate and rhythm Pulm: Clear anteriorly; no wheezing Abdomen:  Soft, Obese AB, Active bowel sounds. No tenderness . Without guarding and Without rebound, No organomegaly appreciated. Extremities:  With 1-2+ edema. Msk:  Symmetrical without gross deformities. Peripheral pulses intact.  Neurologic:  Alert and  oriented x4;  No focal deficits.  Skin:   Dry and intact without significant lesions or rashes. Psychiatric:  Cooperative. Normal mood and affect.  LAB RESULTS: Recent Labs    01/23/22 2038 01/24/22 0756  WBC 10.0 7.0  HGB 8.1* 8.9*  HCT 27.1* 28.4*  PLT 220 195   BMET Recent Labs    01/23/22 2038  NA 140  K 3.7  CL 112*  CO2  17*  GLUCOSE 183*  BUN 54*  CREATININE 1.33*  CALCIUM 9.0   LFT Recent Labs    01/23/22 2038  PROT 5.1*  ALBUMIN 2.7*  AST 16  ALT 15  ALKPHOS 57  BILITOT 0.6   PT/INR Recent Labs    01/23/22 2038  LABPROT 17.3*  INR 1.4*    STUDIES: No results found.   Vladimir Crofts  01/24/2022, 8:11 AM   I have taken an interval history, thoroughly reviewed the chart and examined the patient. I agree with the Advanced Practitioner's note, impression and recommendations, and have recorded additional findings, impressions and recommendations below. I performed a substantive portion of this encounter (>50% time spent), including a complete performance of the medical decision making.  My additional  thoughts are as follows:  I also discussed this patient's case with Dr. Hilarie Fredrickson earlier today.  We strongly suspect this bleeding has been coming from the gastric polypectomy site, despite it having been clipped at the time of polypectomy. Hemoglobin improved appropriately with transfusion last evening, patient is hemodynamically stable, alert, conversational and comfortable.  He has no chest pain dyspnea or abdominal pain.  His vital signs are normal and he is in sinus rhythm.  Plan is for an upper endoscopy with endoscopic control of the bleeding site.  He was agreeable after discussion of procedure and risks.  The benefits and risks of the planned procedure were described in detail with the patient or (when appropriate) their health care proxy.  Risks were outlined as including, but not limited to, bleeding, infection, perforation, adverse medication reaction leading to cardiac or pulmonary decompensation, pancreatitis (if ERCP).  The limitation of incomplete mucosal visualization was also discussed.  No guarantees or warranties were given.  Patient at increased risk for cardiopulmonary complications of procedure due to medical comorbidities.    [name] Office:[phone]

## 2022-01-24 NOTE — ED Notes (Signed)
ED TO INPATIENT HANDOFF REPORT  ED Nurse Name and Phone #:   S Name/Age/Gender [name] [name] 71 y.o. male Room/Bed: WA11/WA11  Code Status   Code Status: Full Code  Home/SNF/Other Home Patient oriented to: self, place, time, and situation Is this baseline? Yes   Triage Complete: Triage complete  Chief Complaint Acute upper GI bleeding [K92.2]  Triage Note BIB EMS for nausea, weakness,coffee ground emesis, and black stools. Pt had an endoscopy on 01/19/22, is on thinners  BP 100/78 HR 70-90 Afib 99% ra    Allergies Allergies  Allergen Reactions   Bystolic [Nebivolol Hcl] Swelling    Bradycardia    Calcium Channel Blockers Swelling    LE edema   Phenergan [Promethazine Hcl] Itching    Hallucination    Beta Adrenergic Blockers Swelling    bradycardia   Cardizem [Diltiazem] Other (See Comments)    Foot swelling and increased his heart rate   Norvasc [Amlodipine] Other (See Comments)   Promethazine Itching and Other (See Comments)   Aspirin Other (See Comments)    Hx of ulcer    Nsaids     Hx ulcer   Prednisone Other (See Comments)    Hx of ulcer    Level of Care/Admitting Diagnosis ED Disposition     ED Disposition  Admit   Condition  --   [place]: [place] [[id number]]  Level of Care: Telemetry [5]  Admit to tele based on following criteria: Monitor for Ischemic changes  May admit patient to [facility] or [facility] if equivalent level of care is available:: No  Covid Evaluation: Asymptomatic - no recent exposure (last 10 days) testing not required  Diagnosis: Acute upper GI bleeding [597416]  Admitting Physician: Lenore Cordia [[id number]]  Attending Physician: Lenore Cordia [[id number]]  Certification:: I certify this patient will need inpatient services for at least 2 midnights  Estimated Length of Stay: 2          B Medical/Surgery History Past Medical History:  Diagnosis Date   Arthritis    OA    Atrial flutter ([place])    a. s/p RFCA 8/13   Cancer ([place]) 2010   prostate   Chronic combined systolic and diastolic CHF (congestive heart failure) ([place])    a. LVEF previously 35% felt to be due tachycardia;  b. 02/2013 Echo: EF 50-55%, no rwma, mildly dil LA/RA, mild to mod MR. C 11/2014 echo - ef 60-65%, unable to determine DD   CKD (chronic kidney disease), stage III (HCC)    hx of a/c renal failure during episode of diverticulitis 9/13 in [place]   Diabetes mellitus ([place])    TYPE 2    Diverticulitis    Diverticulosis    Dyslipidemia    Dysrhythmia    Erectile dysfunction    Gastric ulcer    s/p prior surgery   GERD (gastroesophageal reflux disease)    Headache    History of colonic diverticulitis    History of prostate cancer    Hypertension    Hypothyroid    Kidney stones    Microcytic anemia    Obesities, morbid ([place])    Obstructive sleep apnea    noncompliant with CPAP   Osteoporosis    PAF (paroxysmal atrial fibrillation) ([place])    a. failed DCCV and multiple anti-arrhythmic drugs (multaq/flecainide);   b. s/p  PVI isolation with ablation of AFib 8/13; c. repeat PVI 01/2013;  d. 02/2013 repeat DCCV->Amio load/xarelto.  Peptic ulcer disease 1994   Renal calculi    Sleep apnea 2015   has not had sleep apnea since heart intervention   Thrombocytopenia ([place]) 1992   idopathic-treated with danazol   Tubular adenoma of colon    Past Surgical History:  Procedure Laterality Date   ATRIAL FIBRILLATION ABLATION  12/06/11; 02/05/2013   Afib and atrial flutter ablation by Dr Rayann Heman; repeat PVI by Dr Rayann Heman 02/05/2013   ATRIAL FIBRILLATION ABLATION N/A 12/06/2011   Procedure: ATRIAL FIBRILLATION ABLATION;  Surgeon: Thompson Grayer, MD;  [facility] CATH LAB;  Service: Cardiovascular;  Laterality: N/A;   ATRIAL FIBRILLATION ABLATION N/A 02/05/2013   Procedure: ATRIAL FIBRILLATION ABLATION;  Surgeon: Coralyn Mark, MD;  Location: [facility] CATH LAB;  Service: Cardiovascular;   Laterality: N/A;   benign stomach tumor removal  2000   CARDIOVERSION  08/25/2011   Procedure: CARDIOVERSION;  Surgeon: Jettie Booze, MD;  Location: [facility];  Service: Cardiovascular;  Laterality: N/A;   CARDIOVERSION N/A 03/05/2013   Procedure: CARDIOVERSION;  Surgeon: Sinclair Grooms, MD;  Location: [place];  Service: Cardiovascular;  Laterality: N/A;  BEDSIDE    CARDIOVERSION N/A 03/25/2020   Procedure: CARDIOVERSION;  Surgeon: Skeet Latch, MD;  Location: [place];  Service: Cardiovascular;  Laterality: N/A;   CARDIOVERSION N/A 06/02/2020   Procedure: CARDIOVERSION;  Surgeon: Pixie Casino, MD;  Location: [facility] ENDOSCOPY;  Service: Cardiovascular;  Laterality: N/A;   CARPAL [place] Right 02/25/2020   Procedure: RIGHT CARPAL TUNNEL RELEASE, CUBITAL TUNNEL RELEASE IN SITU, RIGHT WRIST PROXIMAL ROW CARPECTOMY AND PROXIMAL CAPITATE HEAD REPLACEMENT/HEMIARTHROPLASTY WITH POSTERIOR INTEROSSOUS NERVE NEURECTOMY AND REPAIR;  Surgeon: Roseanne Kaufman, MD;  Location: [place];  Service: Orthopedics;  Laterality: Right;  2.5 hrs Block with IV Sedation   convergent afib abaltion [facility] 02/26/15  02/26/2015   [facility] by Dr. Lehman Prom and Dr. Danise Mina   ELECTROPHYSIOLOGIC STUDY N/A 11/24/2014   Procedure: Cardioversion;  Surgeon: Will Meredith Leeds, MD;  Location: [place] CV LAB;  Service: Cardiovascular;  Laterality: N/A;   ESOPHAGOGASTRODUODENOSCOPY N/A 04/09/2013   Procedure: ESOPHAGOGASTRODUODENOSCOPY (EGD) with possible Balloon Dilation;  Surgeon: Garlan Fair, MD;  Location: [facility] ENDOSCOPY;  Service: Endoscopy;  Laterality: N/A;   kidney stone removal     multiple   KNEE ARTHROSCOPY Bilateral 1979   LAPAROSCOPIC RETROPUBIC PROSTATECTOMY  02/2007   hx prostate cancer   ORIF ANKLE FRACTURE Right 10/2015   ORIF ANKLE FRACTURE Right 11/03/2015   Procedure: ORIF RIGHT LISFRANK FOOT;  Surgeon: Newt Minion, MD;  Location: [place];  Service: Orthopedics;   Laterality: Right;   TEE WITHOUT CARDIOVERSION  08/25/2011   Procedure: TRANSESOPHAGEAL ECHOCARDIOGRAM (TEE);  Surgeon: Jettie Booze, MD;  Location: [place];  Service: Cardiovascular;  Laterality: N/A;   TEE WITHOUT CARDIOVERSION  11/09/2011   Procedure: TRANSESOPHAGEAL ECHOCARDIOGRAM (TEE);  Surgeon: Jettie Booze, MD;  Location: [place];  Service: Cardiovascular;  Laterality: N/A;  h/p in file drawer   TEE WITHOUT CARDIOVERSION N/A 02/05/2013   Procedure: TRANSESOPHAGEAL ECHOCARDIOGRAM (TEE);  Surgeon: Candee Furbish, MD;  Location: [facility] ENDOSCOPY;  Service: Cardiovascular;  Laterality: N/A;   TEE WITHOUT CARDIOVERSION N/A 03/25/2020   Procedure: TRANSESOPHAGEAL ECHOCARDIOGRAM (TEE);  Surgeon: Skeet Latch, MD;  Location: [place];  Service: Cardiovascular;  Laterality: N/A;   TOTAL THYROIDECTOMY  2009   benign nodules removed   UPPER GASTROINTESTINAL ENDOSCOPY     WRIST SURGERY Right 1977   with placement of lunate prosthesis  A IV Location/Drains/Wounds Patient Lines/Drains/Airways Status     Active Line/Drains/Airways     Name Placement date Placement time Site Days   Peripheral IV 01/23/22 22 G Anterior;Left Forearm 01/23/22  2033  Forearm  1   Peripheral IV 01/23/22 20 G 2.5" Anterior;Left;Proximal;Upper Arm 01/23/22  2347  Arm  1   Negative Pressure Wound Therapy Foot Right 11/03/15  1830  --  2274   Incision (Closed) 02/25/20 Hand Right 02/25/20  1710  -- 699            Intake/Output Last 24 hours  Intake/Output Summary (Last 24 hours) at 01/24/2022 0224 Last data filed at 01/23/2022 2156 Gross per 24 hour  Intake 94.54 ml  Output --  Net 94.54 ml    Labs/Imaging Results for orders placed or performed during the hospital encounter of 01/23/22 (from the past 48 hour(s))  Comprehensive metabolic panel     Status: Abnormal   Collection Time: 01/23/22  8:38 PM  Result Value Ref Range   Sodium 140 135 - 145 mmol/L   Potassium 3.7  3.5 - 5.1 mmol/L   Chloride 112 (H) 98 - 111 mmol/L   CO2 17 (L) 22 - 32 mmol/L   Glucose, Bld 183 (H) 70 - 99 mg/dL    Comment: Glucose reference range applies only to samples taken after fasting for at least 8 hours.   BUN 54 (H) 8 - 23 mg/dL   Creatinine, Ser 1.33 (H) 0.61 - 1.24 mg/dL   Calcium 9.0 8.9 - 10.3 mg/dL   Total Protein 5.1 (L) 6.5 - 8.1 g/dL   Albumin 2.7 (L) 3.5 - 5.0 g/dL   AST 16 15 - 41 U/L   ALT 15 0 - 44 U/L   Alkaline Phosphatase 57 38 - 126 U/L   Total Bilirubin 0.6 0.3 - 1.2 mg/dL   GFR, Estimated 57 (L) >60 mL/min    Comment: (NOTE) Calculated using the CKD-EPI Creatinine Equation (2021)    Anion gap 11 5 - 15    Comment: Performed at [facility], [place] [address] [place], [place] [address]  CBC with Differential     Status: Abnormal   Collection Time: 01/23/22  8:38 PM  Result Value Ref Range   WBC 10.0 4.0 - 10.5 K/uL   RBC 3.30 (L) 4.22 - 5.81 MIL/uL   Hemoglobin 8.1 (L) 13.0 - 17.0 g/dL   HCT 27.1 (L) 39.0 - 52.0 %   MCV 82.1 80.0 - 100.0 fL   MCH 24.5 (L) 26.0 - 34.0 pg   MCHC 29.9 (L) 30.0 - 36.0 g/dL   RDW 20.4 (H) 11.5 - 15.5 %   Platelets 220 150 - 400 K/uL   nRBC 0.2 0.0 - 0.2 %   Neutrophils Relative % 67 %   Neutro Abs 6.8 1.7 - 7.7 K/uL   Lymphocytes Relative 20 %   Lymphs Abs 2.0 0.7 - 4.0 K/uL   Monocytes Relative 9 %   Monocytes Absolute 0.9 0.1 - 1.0 K/uL   Eosinophils Relative 2 %   Eosinophils Absolute 0.2 0.0 - 0.5 K/uL   Basophils Relative 1 %   Basophils Absolute 0.1 0.0 - 0.1 K/uL   Immature Granulocytes 1 %   Abs Immature Granulocytes 0.10 (H) 0.00 - 0.07 K/uL    Comment: Performed at [facility], [place] [address], [place],  [address]  Protime-INR     Status: Abnormal   Collection Time: 01/23/22  8:38 PM  Result  Value Ref Range   Prothrombin Time 17.3 (H) 11.4 - 15.2 seconds   INR 1.4 (H) 0.8 - 1.2    Comment: (NOTE) INR goal varies based on device and disease  states. Performed at [facility], [place] [address] [place], [place] [address]   Type and screen Register     Status: None (Preliminary result)   Collection Time: 01/23/22  8:38 PM  Result Value Ref Range   ABO/RH(D) O POS    Antibody Screen NEG    Sample Expiration 01/26/2022,2359    Unit Number [id number]    Blood Component Type RED CELLS,LR    Unit division 00    Status of Unit ISSUED    Transfusion Status OK TO TRANSFUSE    Crossmatch Result      Compatible Performed at [facility], [place] [address], [place], [place] [address]   POC occult blood, ED     Status: Abnormal   Collection Time: 01/23/22  9:07 PM  Result Value Ref Range   Fecal Occult Bld POSITIVE (A) NEGATIVE  Prepare RBC (crossmatch)     Status: None   Collection Time: 01/23/22  9:11 PM  Result Value Ref Range   Order Confirmation      ORDER PROCESSED BY BLOOD BANK Performed at [place] [address], [place], [place] [address]   CBG monitoring, ED     Status: Abnormal   Collection Time: 01/24/22 12:32 AM  Result Value Ref Range   Glucose-Capillary 120 (H) 70 - 99 mg/dL    Comment: Glucose reference range applies only to samples taken after fasting for at least 8 hours.   Comment 1 Notify RN   ABO/Rh     Status: None   Collection Time: 01/24/22 12:57 AM  Result Value Ref Range   ABO/RH(D)      O POS Performed at [facility], [place] [address] [place], [place] [address]    No results found.  Pending Labs Unresulted Labs (From admission, onward)     Start     Ordered   01/24/22 0500  CBC  Tomorrow morning,   R        01/23/22 2244   01/24/22 2992  Basic metabolic panel  Tomorrow morning,   R        01/23/22 2244            Vitals/Pain Today's Vitals   01/24/22 0045 01/24/22 0100 01/24/22 0200 01/24/22 0202  BP:  121/72 127/74 124/74  Pulse: 75 80 75 75  Resp: '19 18 12 17  '$ Temp:  99 F  (37.2 C)  98.1 F (36.7 C)  TempSrc:    Oral  SpO2: 99% 99% 99% 100%  PainSc:        Isolation Precautions No active isolations  Medications Medications  pantoprozole (PROTONIX) 80 mg /NS 100 mL infusion (8 mg/hr Intravenous New Bag/Given 01/23/22 2204)  0.9 %  sodium chloride infusion (has no administration in time range)  sodium chloride flush (NS) 0.9 % injection 3 mL (3 mLs Intravenous Not Given 01/24/22 0024)  acetaminophen (TYLENOL) tablet 650 mg (has no administration in time range)    Or  acetaminophen (TYLENOL) suppository 650 mg (has no administration in time range)  ondansetron (ZOFRAN) tablet 4 mg (has no administration in time range)    Or  ondansetron (ZOFRAN) injection 4 mg (has no administration in time range)  insulin aspart (novoLOG) injection 0-9 Units (  Subcutaneous Not Given 01/24/22 0034)  DULoxetine (CYMBALTA) DR capsule 60 mg (has no administration in time range)  levothyroxine (SYNTHROID) tablet 175 mcg (has no administration in time range)  carvedilol (COREG) tablet 37.5 mg (has no administration in time range)  rosuvastatin (CRESTOR) tablet 10 mg (has no administration in time range)  lactated ringers bolus 1,000 mL (1,000 mLs Intravenous New Bag/Given 01/23/22 2138)  pantoprazole (PROTONIX) 80 mg /NS 100 mL IVPB (0 mg Intravenous Stopped 01/23/22 2156)  ondansetron (ZOFRAN) injection 4 mg (4 mg Intravenous Given 01/23/22 2137)  fentaNYL (SUBLIMAZE) injection 50 mcg (50 mcg Intravenous Given 01/23/22 2137)    Mobility walks Low fall risk   Focused Assessments    R Recommendations: See Admitting Provider Note  Report given to:   Additional Notes:

## 2022-01-24 NOTE — Op Note (Signed)
[facility] Patient Name: [name] [name] Procedure Date: 01/24/2022 MRN: [id number] Attending MD: Estill Cotta. Loletha Carrow , MD Date of Birth: Mar 25, 1951 CSN: [id number] Age: 71 Admit Type: Inpatient Procedure:                Upper GI endoscopy Indications:              Acute post hemorrhagic anemia, Melena, Suspected                            gastric post-polypectomy bleed Providers:                Mallie Mussel L. Loletha Carrow, MD, Orvil Feil, RN, Benetta Spar, Technician Referring MD:             [facility] Medicines:                Monitored Anesthesia Care Complications:            No immediate complications. Estimated Blood Loss:     Estimated blood loss was minimal. Procedure:                Pre-Anesthesia Assessment:                           - Prior to the procedure, a History and Physical                            was performed, and patient medications and                            allergies were reviewed. The patient's tolerance of                            previous anesthesia was also reviewed. The risks                            and benefits of the procedure and the sedation                            options and risks were discussed with the patient.                            All questions were answered, and informed consent                            was obtained. Prior Anticoagulants: The patient has                            taken Eliquis (apixaban), last dose was 2 days                            prior to procedure. ASA Grade Assessment: III - A  patient with severe systemic disease. After                            reviewing the risks and benefits, the patient was                            deemed in satisfactory condition to undergo the                            procedure.                           After obtaining informed consent, the endoscope was                            passed under direct  vision. Throughout the                            procedure, the patient's blood pressure, pulse, and                            oxygen saturations were monitored continuously. The                            [id number] ([id number]) Olympus endoscope was introduced                            through the mouth, and advanced to the second part                            of duodenum. The upper GI endoscopy was                            accomplished without difficulty. The patient                            tolerated the procedure well. Scope In: Scope Out: Findings:      The esophagus was normal.      One non-bleeding linear gastric ulcer with pigmented material was found       in the prepyloric region of the stomach (polypectomy site). The lesion       was 8-10 mm in largest dimension. Area was successfully injected with 3       mL of a 1:10,000 solution of epinephrine for hemostasis (excellent       blanching). For hemostasis, three hemostatic clips were successfully       placed (MR conditional). There was no fresh or old blood in the UGI       tract.      The exam of the stomach was otherwise normal.      The cardia and gastric fundus were normal on retroflexion.      The examined duodenum was normal. Impression:               - Normal esophagus.                           -  Non-bleeding gastric ulcer with pigmented                            material (polypectomy site). Injected. Clips (MR                            conditional) were placed.                           - Normal examined duodenum.                           - No specimens collected. Moderate Sedation:      MAC sedation used Recommendation:           - Return patient to hospital ward for ongoing care                            and overnight observation.                           - Resume regular diet.                           - AM CBC.                           Patient will be seen by GI service tomorrow and                             most likely able to discharge then if clinically                            stable.                           Resume Eliquis in 5 days ([name] is for A fib and this                            patient is currently in NSR)                           - Wife updated by phone. Procedure Code(s):        --- Professional ---                           [phone], Esophagogastroduodenoscopy, flexible,                            transoral; with control of bleeding, any method Diagnosis Code(s):        --- Professional ---                           K25.9, Gastric ulcer, unspecified as acute or                            chronic, without hemorrhage or perforation  D62, Acute posthemorrhagic anemia                           K92.1, Melena (includes Hematochezia) CPT copyright 2019 [employer]. All rights reserved. The codes documented in this report are preliminary and upon coder review may  be revised to meet current compliance requirements. Yaritza Leist L. Loletha Carrow, MD 01/24/2022 4:14:55 PM This report has been signed electronically. Number of Addenda: 0

## 2022-01-24 NOTE — Progress Notes (Signed)
PROGRESS NOTE    [name] [name]  FYB:[id number] DOB: Apr 29, 1950 DOA: 01/23/2022 PCP: Donnajean Lopes, MD   Brief Narrative:  This 71 years old male with PMH significant for PAF on Xarelto, chronic HFpEF (EF 60-65%), CKD stage IIIa, T2DM, IDA, HTN, HLD, hypothyroidism, depression/anxiety, OSA intolerant to CPAP using mouth guard who presented to the ED for evaluation of lightheadedness in setting of black stools and coffee-ground emesis.  Patient had an episode of lightheadedness in the church after standing, followed by black stools at home. Patient recently underwent upper endoscopy on 01/17/2022 by [facility], Dr. Hilarie Fredrickson, for evaluation of iron deficiency anemia.  A single gastric polyp was seen, resected, and clip placed.  Pathology showed a gastric hyperplastic polyp and mild chronic gastritis changes. Patient is admitted for acute upper GI bleeding, started on Protonix infusion , GI is consulted.  Scheduled for EGD today.  Assessment & Plan:   Principal Problem:   Acute upper GI bleeding Active Problems:   Symptomatic anemia   Paroxysmal atrial fibrillation (HCC)   Chronic heart failure with preserved ejection fraction (HFpEF) (HCC)   Type 2 diabetes mellitus with stage 3 chronic kidney disease (HCC)   Hypertension associated with diabetes ([place])   Obstructive sleep apnea-C-pap intol   Chronic kidney disease, stage 3a (HCC)   Hypothyroidism   Hyperlipidemia associated with type 2 diabetes mellitus (HCC)  Acute upper GI bleeding Symptomatic anemia: Patient had an episode of lightheadedness in the church after standing, followed by black stools at home. Recent EGD 01/17/2022 showed single gastric polyp s/p resection and clip. Hemoglobin down to 8.1 from 11.1 5 days back. Continue IV Protonix infusion. Status post 1 unit PRBC.  Hemoglobin 8.9 Hold Xarelto.  Monitor H&H. GI is consulted,scheduled to have EGD today.  Paroxysmal A-fib: Heart rate is reasonably controlled.   Holding Xarelto. Continue Multaq and carvedilol  Chronic diastolic heart failure: Patient appears euvolemic on exam. LVEF 60 to 65% by TEE 03/25/2020 Patient is not on diuretics as an outpatient Continue Coreg.  Type 2 diabetes: Hold metformin and Jardiance Continue regular insulin sliding scale  Essential hypertension: BP stable.  Continue Coreg but hold losartan due to risk of hypotension with GI bleed  Hyperlipidemia: Continue Crestor  Hypothyroidism: Continue Synthroid   CKD stage IIIa Serum creatinine at baseline. Avoid nephrotoxic medications,  Obstructive sleep apnea not on CPAP: Uses mouthguard piece.  Obesity: Diet and exercise discussed in detail Estimated body mass index is 34.46 kg/m as calculated from the following:   Height as of this encounter: '5\' 7"'$  (1.702 m).   Weight as of this encounter: 99.8 kg.   DVT prophylaxis: SCDs Code Status: Full code Family Communication: No family at bedside. Disposition Plan:   Status is: Inpatient Remains inpatient appropriate because: Admitted for upper GI bleeding, scheduled to have EGD today.  Anticipated discharge in 1 to 2 days as cleared for GI.    Consultants:  Gastroenterology  Procedures: EGD Antimicrobials: None  Subjective: Patient was seen and examined at bedside.  Overnight events noted. Patient reports feeling better,  he denies any further bleeding in the stool.   He is scheduled to have EGD today.  Objective: Vitals:   01/24/22 0500 01/24/22 0804 01/24/22 1147 01/24/22 1402  BP: 139/76 133/67 139/75 (!) 150/67  Pulse: 73 67 69 74  Resp: '18 16 18 19  '$ Temp: 98.2 F (36.8 C) 98.1 F (36.7 C) 98.2 F (36.8 C) 98.5 F (36.9 C)  TempSrc: Oral Oral Oral Temporal  SpO2: 99% 96% 98% 96%  Weight:      Height:        Intake/Output Summary (Last 24 hours) at 01/24/2022 1457 Last data filed at 01/24/2022 0500 Gross per 24 hour  Intake 1463.27 ml  Output --  Net 1463.27 ml   Filed  Weights   01/24/22 0339  Weight: 99.8 kg    Examination:  General exam: Appears comfortable, not in any acute distress. Respiratory system: CTA bilaterally, respiratory effort normal, RR 15 Cardiovascular system: S1 & S2 heard, regular rate and rhythm, no murmur. Gastrointestinal system: Abdomen is soft, non tender, non distended, BS+ Central nervous system: Alert and oriented x 3. No focal neurological deficits. Extremities: No edema, no cyanosis, no clubbing Skin: No rashes, lesions or ulcers Psychiatry: Judgement and insight appear normal. Mood & affect appropriate.     Data Reviewed: I have personally reviewed following labs and imaging studies  CBC: Recent Labs  Lab 01/23/22 2038 01/24/22 0756  WBC 10.0 7.0  NEUTROABS 6.8  --   HGB 8.1* 8.9*  HCT 27.1* 28.4*  MCV 82.1 82.8  PLT 220 937   Basic Metabolic Panel: Recent Labs  Lab 01/23/22 2038 01/24/22 0756  NA 140 141  K 3.7 3.4*  CL 112* 114*  CO2 17* 21*  GLUCOSE 183* 135*  BUN 54* 51*  CREATININE 1.33* 1.34*  CALCIUM 9.0 8.5*   GFR: Estimated Creatinine Clearance: 56.9 mL/min (A) (by C-G formula based on SCr of 1.34 mg/dL (H)). Liver Function Tests: Recent Labs  Lab 01/23/22 2038  AST 16  ALT 15  ALKPHOS 57  BILITOT 0.6  PROT 5.1*  ALBUMIN 2.7*   No results for input(s): "LIPASE", "AMYLASE" in the last 168 hours. No results for input(s): "AMMONIA" in the last 168 hours. Coagulation Profile: Recent Labs  Lab 01/23/22 2038  INR 1.4*   Cardiac Enzymes: No results for input(s): "CKTOTAL", "CKMB", "CKMBINDEX", "TROPONINI" in the last 168 hours. BNP (last 3 results) No results for input(s): "PROBNP" in the last 8760 hours. HbA1C: No results for input(s): "HGBA1C" in the last 72 hours. CBG: Recent Labs  Lab 01/24/22 0032 01/24/22 0457 01/24/22 0759 01/24/22 1144  GLUCAP 120* 139* 115* 108*   Lipid Profile: No results for input(s): "CHOL", "HDL", "LDLCALC", "TRIG", "CHOLHDL",  "LDLDIRECT" in the last 72 hours. Thyroid Function Tests: No results for input(s): "TSH", "T4TOTAL", "FREET4", "T3FREE", "THYROIDAB" in the last 72 hours. Anemia Panel: No results for input(s): "VITAMINB12", "FOLATE", "FERRITIN", "TIBC", "IRON", "RETICCTPCT" in the last 72 hours. Sepsis Labs: No results for input(s): "PROCALCITON", "LATICACIDVEN" in the last 168 hours.  No results found for this or any previous visit (from the past 240 hour(s)).   Radiology Studies: No results found.  Scheduled Meds:  [MAR Hold] carvedilol  37.5 mg Oral BID   [MAR Hold] DULoxetine  60 mg Oral Daily   [MAR Hold] insulin aspart  0-9 Units Subcutaneous Q4H   [MAR Hold] levothyroxine  175 mcg Oral Q0600   [MAR Hold] rosuvastatin  10 mg Oral Daily   [MAR Hold] sodium chloride flush  3 mL Intravenous Q12H   Continuous Infusions:  [MAR Hold] sodium chloride     sodium chloride     lactated ringers 20 mL/hr (01/24/22 1410)   pantoprazole 8 mg/hr (01/24/22 0716)     LOS: 1 day    Time spent: 50 mins    Emileigh Kellett, MD [facility]   If 7PM-7AM, please contact night-coverage

## 2022-01-24 NOTE — Transfer of Care (Signed)
Immediate Anesthesia Transfer of Care Note  Patient: [name] [name]  Procedure(s) Performed: ESOPHAGOGASTRODUODENOSCOPY (EGD) WITH PROPOFOL HEMOSTASIS CLIP PLACEMENT SUBMUCOSAL INJECTION  Patient Location: Endoscopy Unit  Anesthesia Type:MAC  Level of Consciousness: awake and alert   Airway & Oxygen Therapy: Patient Spontanous Breathing and Patient connected to face mask oxygen  Post-op Assessment: Report given to RN and Post -op Vital signs reviewed and stable  Post vital signs: Reviewed and stable  Last Vitals:  Vitals Value Taken Time  BP    Temp    Pulse 70 01/24/22 1609  Resp 21 01/24/22 1609  SpO2 100 % 01/24/22 1609  Vitals shown include unvalidated device data.  Last Pain:  Vitals:   01/24/22 1402  TempSrc: Temporal  PainSc: 0-No pain         Complications: No notable events documented.

## 2022-01-24 NOTE — Telephone Encounter (Signed)
Pt is admitted

## 2022-01-24 NOTE — Progress Notes (Signed)
Mobility Specialist - Progress Note   01/24/22 1015  Mobility  Activity Ambulated independently in hallway  Level of Assistance Independent after set-up  Assistive Device None  Distance Ambulated (ft) 350 ft  Range of Motion/Exercises Active  Activity Response Tolerated well  Mobility Referral Yes  $Mobility charge 1 Mobility   Pt was found in bed and agreeable to ambulate. Had no complaints and at EOS returned to bed with all necessities in reach.  Ferd Hibbs Mobility Specialist

## 2022-01-25 ENCOUNTER — Other Ambulatory Visit: Payer: Self-pay

## 2022-01-25 ENCOUNTER — Telehealth: Payer: Self-pay

## 2022-01-25 DIAGNOSIS — K922 Gastrointestinal hemorrhage, unspecified: Secondary | ICD-10-CM | POA: Diagnosis not present

## 2022-01-25 DIAGNOSIS — D509 Iron deficiency anemia, unspecified: Secondary | ICD-10-CM

## 2022-01-25 DIAGNOSIS — D62 Acute posthemorrhagic anemia: Secondary | ICD-10-CM

## 2022-01-25 LAB — TYPE AND SCREEN
ABO/RH(D): O POS
Antibody Screen: NEGATIVE
Unit division: 0

## 2022-01-25 LAB — CBC
HCT: 27.9 % — ABNORMAL LOW (ref 39.0–52.0)
Hemoglobin: 8.6 g/dL — ABNORMAL LOW (ref 13.0–17.0)
MCH: 26 pg (ref 26.0–34.0)
MCHC: 30.8 g/dL (ref 30.0–36.0)
MCV: 84.3 fL (ref 80.0–100.0)
Platelets: 187 10*3/uL (ref 150–400)
RBC: 3.31 MIL/uL — ABNORMAL LOW (ref 4.22–5.81)
RDW: 19.9 % — ABNORMAL HIGH (ref 11.5–15.5)
WBC: 10.2 10*3/uL (ref 4.0–10.5)
nRBC: 0 % (ref 0.0–0.2)

## 2022-01-25 LAB — BASIC METABOLIC PANEL
Anion gap: 5 (ref 5–15)
BUN: 30 mg/dL — ABNORMAL HIGH (ref 8–23)
CO2: 22 mmol/L (ref 22–32)
Calcium: 8 mg/dL — ABNORMAL LOW (ref 8.9–10.3)
Chloride: 114 mmol/L — ABNORMAL HIGH (ref 98–111)
Creatinine, Ser: 1.2 mg/dL (ref 0.61–1.24)
GFR, Estimated: >60 mL/min (ref >60)
Glucose, Bld: 155 mg/dL — ABNORMAL HIGH (ref 70–99)
Potassium: 3.7 mmol/L (ref 3.5–5.1)
Sodium: 141 mmol/L (ref 135–145)

## 2022-01-25 LAB — BPAM RBC
Blood Product Expiration Date: 202311182359
ISSUE DATE / TIME: 202310160212
Unit Type and Rh: 5100

## 2022-01-25 LAB — PHOSPHORUS: Phosphorus: 3.5 mg/dL (ref 2.5–4.6)

## 2022-01-25 LAB — GLUCOSE, CAPILLARY
Glucose-Capillary: 135 mg/dL — ABNORMAL HIGH (ref 70–99)
Glucose-Capillary: 153 mg/dL — ABNORMAL HIGH (ref 70–99)
Glucose-Capillary: 168 mg/dL — ABNORMAL HIGH (ref 70–99)
Glucose-Capillary: 73 mg/dL (ref 70–99)

## 2022-01-25 LAB — MAGNESIUM: Magnesium: 2 mg/dL (ref 1.7–2.4)

## 2022-01-25 MED ORDER — PANTOPRAZOLE SODIUM 40 MG PO TBEC: 40.0000 mg | DELAYED_RELEASE_TABLET | Freq: Two times a day (BID) | ORAL | 1 refills | Status: DC

## 2022-01-25 MED ORDER — XARELTO 15 MG PO TABS: 15.0000 mg | ORAL_TABLET | Freq: Every day | ORAL | 0 refills | Status: DC

## 2022-01-25 NOTE — Progress Notes (Signed)
Patient discharged home.  IVs removed - WNL.  Reviewed AVS and medications, instructed to dollow up with PCP and GI in 1 week.  Pt verbalizes understanding.  No questions at this time.  Assisted off unit in NAD to provate vehicle.

## 2022-01-25 NOTE — Telephone Encounter (Signed)
Lab orders and reminder in epic.Pt scheduled to see Vicie Mutters PA 03/08/22 at [facility], RN Particia Nearing Aug 26, 2050  Sorry forgot to attach        Previous Messages    ----- Message -----  From: Algernon Huxley, RN  Sent: 01/25/2022  11:22 AM EDT  To: Vladimir Crofts, PA-C  Subject: RE: Please set up a hospital follow up! Than*   What is the pts name?   ----- Message -----  From: Delfina Redwood  Sent: 01/25/2022  10:57 AM EDT  To: Algernon Huxley, RN  Subject: Please set up a hospital follow up! Thanks!     Patient of Dr. Hilarie Fredrickson, getting discharged for post gastric polypectomy bleed, needs 6-8 week OV Dr. Hilarie Fredrickson or APP ( me). Needs recheck of CBC only 2-3 weeks  Thanks

## 2022-01-25 NOTE — Progress Notes (Signed)
  Transition of Care [facility]) Screening Note   Patient Details  Name: SUYASH AMORY Date of Birth: 23-Jul-1950   Transition of Care [facility]) CM/SW Contact:    Roseanne Kaufman, RN Phone Number: 01/25/2022, 11:45 AM    Transition of Care Department [facility]) has reviewed patient and no TOC needs have been identified at this time. We will continue to monitor patient advancement through interdisciplinary progression rounds. If new patient transition needs arise, please place a TOC consult.

## 2022-01-25 NOTE — Progress Notes (Addendum)
Progress Note  Primary GI: Dr. Hilarie Fredrickson   Subjective  Chief Complaint:Melena, acute on chronic anemia 7 days status post benign gastric polypectomy while on Xarelto  Patient lying in bed, wife at bedside. Hemoglobin stable, had bowel movement last night 2 AM with black stool, no BM since that time. Denies any abdominal pain, nausea. No shortness of breath or chest pain. Patient is interested in going home.    Objective   Vital signs in last 24 hours: Temp:  [97.9 F (36.6 C)-98.5 F (36.9 C)] 98.1 F (36.7 C) (10/17 0842) Pulse Rate:  [61-74] 66 (10/17 0842) Resp:  [17-23] 17 (10/17 0842) BP: (113-150)/(43-86) 145/74 (10/17 0842) SpO2:  [95 %-100 %] 99 % (10/17 0842) Weight:  [102.1 kg] 102.1 kg (10/17 0416) Last BM Date : 01/24/22 Last BM recorded by nurses in past 5 days No data recorded  General:   male in no acute distress  Heart:  Regular rate and rhythm; no murmurs Pulm: Clear anteriorly; no wheezing Abdomen:  Soft, Obese AB, Active bowel sounds. No tenderness . , No organomegaly appreciated. Extremities:  without  edema. Neurologic:  Alert and  oriented x4;  No focal deficits.  Psych:  Cooperative. Normal mood and affect.  Intake/Output from previous day: 10/16 0701 - 10/17 0700 In: 644.2 [P.O.:240; I.V.:404.2] Out: -  Intake/Output this shift: Total I/O In: -  Out: 550 [Urine:550]  Studies/Results: No results found.  Lab Results: Recent Labs    01/23/22 2038 01/24/22 0756 01/25/22 0358  WBC 10.0 7.0 10.2  HGB 8.1* 8.9* 8.6*  HCT 27.1* 28.4* 27.9*  PLT 220 195 187   BMET Recent Labs    01/23/22 2038 01/24/22 0756 01/25/22 0358  NA 140 141 141  K 3.7 3.4* 3.7  CL 112* 114* 114*  CO2 17* 21* 22  GLUCOSE 183* 135* 155*  BUN 54* 51* 30*  CREATININE 1.33* 1.34* 1.20  CALCIUM 9.0 8.5* 8.0*   LFT Recent Labs    01/23/22 2038  PROT 5.1*  ALBUMIN 2.7*  AST 16  ALT 15  ALKPHOS 57  BILITOT 0.6   PT/INR Recent Labs     01/23/22 2038  LABPROT 17.3*  INR 1.4*     Scheduled Meds:  carvedilol  37.5 mg Oral BID   DULoxetine  60 mg Oral Daily   insulin aspart  0-9 Units Subcutaneous Q4H   levothyroxine  175 mcg Oral Q0600   rosuvastatin  10 mg Oral Daily   sodium chloride flush  3 mL Intravenous Q12H   Continuous Infusions:  sodium chloride     pantoprazole 8 mg/hr (01/25/22 0456)     Impression/Plan:   Melena without hemodynamic compromise 7 days status post EGD with benign gastric polypectomy, Xarelto started 5 days ago. 3 gram drop in hemoglobin on admission, s/p 1 PRBC, today HGB stable 8.9 to 8.6 Acute elevation of BUN from 18-54- improved to 30 10/16 EGD with Dr. Loletha Carrow showed nonbleeding linear gastric ulcer prepyloric region at the polypectomy site in the 8 to 10 mm largest diameter, injected with epi, 3 clips placed. Patient had some abdominal discomfort and nausea afterwards due to appendectomy, this is resolved. Had bowel movement this morning 2 AM, likely old blood. Continue proton pump inhibitor pantoprazole 40 mg twice daily outpatient for at least 2 to 3 months. We will schedule for follow-up in the office with Dr. Hilarie Fredrickson or one of the APP's   Paroxysmal atrial fibrillation Xarelto on hold Per Dr. Corena Pilgrim note,  hold xarelto for at least 5 days after procedure.    Acute on chronic anemia Stable hemoglobin today, no SOB or chest pain Tolerates oral iron, can do iron outpatient twice a day with vitamin C or small juice Anemia studies on 01/05/2022  Iron 40 Ferritin 12.3  May benefit from IV iron if not tolerating   HFpEF TEE 03/2020 EF 60 to 65% no atrial appendage thrombus, mild dilatation of aorta 38 mm   Type 2 diabetes with CKD stage III BUN 30 Cr 1.20   Principal Problem:   Acute upper GI bleeding Active Problems:   Obstructive sleep apnea-C-pap intol   Hypertension associated with diabetes ([place])   Chronic heart failure with preserved ejection fraction (HFpEF) (HCC)    Chronic kidney disease, stage 3a (HCC)   Paroxysmal atrial fibrillation (HCC)   Type 2 diabetes mellitus with stage 3 chronic kidney disease ([place])   Hypothyroidism   Hyperlipidemia associated with type 2 diabetes mellitus ([place])   Symptomatic anemia   Acute blood loss anemia    LOS: 2 days   Vladimir Crofts  01/25/2022, 8:59 AM   Attending physician's note   I have reviewed the chart and  discussed his care on rounds. I performed a substantive portion of this encounter, including complete performance of at least one of the key components, in conjunction with the APP. I agree with the APP's note, impression, and recommendations with my edits.  Plan for d/c home today.   [address] DO, FACG [phone] office

## 2022-01-25 NOTE — Discharge Summary (Signed)
Physician Discharge Summary  [name] [name] LFY:[id number] DOB: 1951-02-03 DOA: 01/23/2022  PCP: Donnajean Lopes, MD  Admit date: 01/23/2022 Discharge date: 01/25/2022 Recommendations for Outpatient Follow-up:  Follow up with PCP in 1 weeks-call for appointment Please obtain BMP/CBC in one week  Discharge Dispo: home Discharge Condition: Stable Code Status:   Code Status: Full Code Diet recommendation:  Diet Order             Diet regular Room service appropriate? Yes; Fluid consistency: Thin  Diet effective now                    Brief/Interim Summary: [name] [name] is a 71 y.o. male with medical history significant for PAF on Xarelto, chronic HFpEF (EF 60-65%), CKD stage IIIa, T2DM, IDA, HTN, HLD, hypothyroidism, depression/anxiety, OSA intolerant to CPAP using mouth guard who is admitted with symptomatic anemia due to acute upper GI bleed.10/16 EGD with Dr. Loletha Carrow showed nonbleeding linear gastric ulcer prepyloric region at the polypectomy site in the 8 to 10 mm largest diameter, injected with epi, 3 clips placed. Patient had some abdominal discomfort and nausea afterwards due to appendectomy, this is resolved. Had bowel movement this morning 2 AM, likely old blood. Continue proton pump inhibitor pantoprazole 40 mg twice daily outpatient for at least 2 to 3 months. GI will schedule for follow-up in the office with Dr. Hilarie Fredrickson or one of the APP's>GI advised to hold xarelto for at least 5 days after procedure.    Patient was discharged home in medically stable condition.  Discharge Diagnoses:  Principal Problem:   Acute upper GI bleeding Active Problems:   Symptomatic anemia   Paroxysmal atrial fibrillation (HCC)   Chronic heart failure with preserved ejection fraction (HFpEF) (HCC)   Type 2 diabetes mellitus with stage 3 chronic kidney disease (HCC)   Hypertension associated with diabetes (HCC)   Obstructive sleep apnea-C-pap intol   Chronic kidney disease, stage 3a  (HCC)   Hypothyroidism   Hyperlipidemia associated with type 2 diabetes mellitus (HCC)   Acute blood loss anemia Acute upper GI bleeding Symptomatic anemia:  .10/16 EGD with Dr. Loletha Carrow showed nonbleeding linear gastric ulcer prepyloric region at the polypectomy site in the 8 to 10 mm largest diameter, injected with epi, 3 clips placed. Patient had some abdominal discomfort and nausea afterwards due to appendectomy, this is resolved. Had bowel movement this morning 2 AM, likely old blood. Continue proton pump inhibitor pantoprazole 40 mg twice daily outpatient for at least 2 to 3 months. GI will schedule for follow-up in the office with Dr. Hilarie Fredrickson or one of the APP's>GI advised to hold xarelto for at least 5 days after procedure.    Recent Labs  Lab 01/23/22 2038 01/24/22 0756 01/25/22 0358  HGB 8.1* 8.9* 8.6*  HCT 27.1* 28.4* 27.9*      Paroxysmal A-fib: Continue home meds except Xarelto hold for 5 days as per GI   Chronic diastolic heart failure:Compensated, not on diuretics continue Coreg    Type 2 diabetes: Continue home meds stable stable  Essential hypertension: BP stable.  Continue Coreg and resume rest of the meds Hyperlipidemia:Continue Crestor Hypothyroidism:Continue Synthroid  CKD stage IIIaSerum creatinine at baseline. Obstructive sleep apnea not on CPAP:Uses mouthguard piece.  Consults: Gastroenterology Subjective: Alert awake oriented at the bedside is eager to go home today  Discharge Exam: Vitals:   01/25/22 0416 01/25/22 0842  BP: 122/72 (!) 145/74  Pulse: 74 66  Resp: 18 17  Temp: 97.9  F (36.6 C) 98.1 F (36.7 C)  SpO2: 97% 99%   General: Pt is alert, awake, not in acute distress Cardiovascular: RRR, S1/S2 +, no rubs, no gallops Respiratory: CTA bilaterally, no wheezing, no rhonchi Abdominal: Soft, NT, ND, bowel sounds + Extremities: no edema, no cyanosis  Discharge Instructions  Discharge Instructions     Discharge instructions   Complete  by: As directed    Check cbc In 1 wk from pcp Please call gi office for follow up appointment  Please call call MD or return to ER for similar or worsening recurring problem that brought you to hospital or if any fever,nausea/vomiting,abdominal pain, uncontrolled pain, chest pain,  shortness of breath or any other alarming symptoms.  Please follow-up your doctor as instructed in a week time and call the office for appointment.  Please avoid alcohol, smoking, or any other illicit substance and maintain healthy habits including taking your regular medications as prescribed.  You were cared for by a hospitalist during your hospital stay. If you have any questions about your discharge medications or the care you received while you were in the hospital after you are discharged, you can call the unit and ask to speak with the hospitalist on call if the hospitalist that took care of you is not available.  Once you are discharged, your primary care physician will handle any further medical issues. Please note that NO REFILLS for any discharge medications will be authorized once you are discharged, as it is imperative that you return to your primary care physician (or establish a relationship with a primary care physician if you do not have one) for your aftercare needs so that they can reassess your need for medications and monitor your lab values   Increase activity slowly   Complete by: As directed       Allergies as of 01/25/2022       Reactions   Bystolic [nebivolol Hcl] Swelling, Other (See Comments)   Bradycardia   Calcium Channel Blockers Swelling, Other (See Comments)   LE edema   Phenergan [promethazine Hcl] Itching, Other (See Comments)   Hallucination   Beta Adrenergic Blockers Swelling, Other (See Comments)   bradycardia   Cardizem [diltiazem] Other (See Comments)   Foot swelling and increased his heart rate   Norvasc [amlodipine] Other (See Comments)   Unknown reaction    Aspirin Other (See Comments)   Hx of ulcer   Nsaids Other (See Comments)   Hx ulcer   Prednisone Other (See Comments)   Hx of ulcer        Medication List     STOP taking these medications    esomeprazole 40 MG capsule Commonly known as: NEXIUM   HYDROmorphone 2 MG tablet Commonly known as: Dilaudid       TAKE these medications    acetaminophen 500 MG tablet Commonly known as: TYLENOL Take 1,000 mg by mouth every 6 (six) hours as needed for moderate pain or headache.   BONINE PO Take 1 tablet by mouth daily as needed (nausea).   carvedilol 12.5 MG tablet Commonly known as: COREG TAKE 3 TABLETS BY MOUTH TWICE DAILY What changed:  how much to take how to take this when to take this additional instructions   cyanocobalamin 1000 MCG tablet Commonly known as: VITAMIN B12 Take 1,000 mcg by mouth daily.   DULoxetine 60 MG capsule Commonly known as: CYMBALTA Take 60 mg by mouth daily.   FOLIC ACID PO Take 7,616 mcg by mouth  daily.   Glucosamine HCl 1500 MG Tabs Take 1,500 mg by mouth in the morning and at bedtime.   Jardiance 25 MG Tabs tablet Generic drug: empagliflozin Take 25 mg by mouth daily.   levothyroxine 175 MCG tablet Commonly known as: SYNTHROID Take 175 mcg by mouth daily.   losartan 50 MG tablet Commonly known as: COZAAR Take 50 mg by mouth every evening.   metFORMIN 500 MG 24 hr tablet Commonly known as: GLUCOPHAGE-XR Take 1,000 mg by mouth 2 (two) times daily.   Multaq 400 MG tablet Generic drug: dronedarone TAKE 1 TABLET TWICE DAILY  WITH MEALS What changed: how much to take   multivitamin with minerals Tabs tablet Take 1 tablet by mouth daily. One-A-Day for Men   pantoprazole 40 MG tablet Commonly known as: Protonix Take 1 tablet (40 mg total) by mouth 2 (two) times daily.   polycarbophil 625 MG tablet Commonly known as: FIBERCON Take 1,250 mg by mouth every evening.   PreserVision AREDS 2 Caps Take 1 capsule by mouth 2  (two) times daily.   PROBIOTIC PO Take 1 capsule by mouth at bedtime.   rosuvastatin 10 MG tablet Commonly known as: CRESTOR Take 1 tablet by mouth once daily   Trulicity 5.32 DJ/2.4QA Sopn Generic drug: Dulaglutide Inject 1.5 mg into the skin once a week. Tuesdays   Vitamin D (Ergocalciferol) 1.25 MG (50000 UNIT) Caps capsule Commonly known as: DRISDOL Take 50,000 Units by mouth every 7 (seven) days. Tuesdays   Xarelto 15 MG Tabs tablet Generic drug: Rivaroxaban Take 1 tablet (15 mg total) by mouth daily. Hold for 5 days from 10/16 when you had endoscopy Start taking on: January 29, 2022 What changed:  additional instructions These instructions start on January 29, 2022. If you are unsure what to do until then, ask your doctor or other care provider.        Follow-up Information     Donnajean Lopes, MD Follow up in 1 week(s).   Specialty: Internal Medicine Contact information: [address] [place] [address] [phone]         Jerene Bears, MD Follow up in 1 week(s).   Specialty: Gastroenterology Contact information: [address] [place] [address] [phone]                Allergies  Allergen Reactions   Bystolic [Nebivolol Hcl] Swelling and Other (See Comments)    Bradycardia    Calcium Channel Blockers Swelling and Other (See Comments)    LE edema   Phenergan [Promethazine Hcl] Itching and Other (See Comments)    Hallucination    Beta Adrenergic Blockers Swelling and Other (See Comments)    bradycardia   Cardizem [Diltiazem] Other (See Comments)    Foot swelling and increased his heart rate   Norvasc [Amlodipine] Other (See Comments)    Unknown reaction   Aspirin Other (See Comments)    Hx of ulcer    Nsaids Other (See Comments)    Hx ulcer   Prednisone Other (See Comments)    Hx of ulcer    The results of significant diagnostics from this hospitalization (including imaging, microbiology, ancillary and  laboratory) are listed below for reference.    Microbiology: No results found for this or any previous visit (from the past 240 hour(s)).  Procedures/Studies: No results found.  Labs: BNP (last 3 results) No results for input(s): "BNP" in the last 8760 hours. Basic Metabolic Panel: Recent Labs  Lab 01/23/22 2038 01/24/22  0756 01/25/22 0358  NA 140 141 141  K 3.7 3.4* 3.7  CL 112* 114* 114*  CO2 17* 21* 22  GLUCOSE 183* 135* 155*  BUN 54* 51* 30*  CREATININE 1.33* 1.34* 1.20  CALCIUM 9.0 8.5* 8.0*  MG  --   --  2.0  PHOS  --   --  3.5   Liver Function Tests: Recent Labs  Lab 01/23/22 2038  AST 16  ALT 15  ALKPHOS 57  BILITOT 0.6  PROT 5.1*  ALBUMIN 2.7*   No results for input(s): "LIPASE", "AMYLASE" in the last 168 hours. No results for input(s): "AMMONIA" in the last 168 hours. CBC: Recent Labs  Lab 01/23/22 2038 01/24/22 0756 01/25/22 0358  WBC 10.0 7.0 10.2  NEUTROABS 6.8  --   --   HGB 8.1* 8.9* 8.6*  HCT 27.1* 28.4* 27.9*  MCV 82.1 82.8 84.3  PLT 220 195 187   Cardiac Enzymes: No results for input(s): "CKTOTAL", "CKMB", "CKMBINDEX", "TROPONINI" in the last 168 hours. BNP: Invalid input(s): "POCBNP" CBG: Recent Labs  Lab 01/24/22 2033 01/25/22 0019 01/25/22 0425 01/25/22 0728 01/25/22 1131  GLUCAP 238* 73 153* 135* 168*   D-Dimer No results for input(s): "DDIMER" in the last 72 hours. Hgb A1c No results for input(s): "HGBA1C" in the last 72 hours. Lipid Profile No results for input(s): "CHOL", "HDL", "LDLCALC", "TRIG", "CHOLHDL", "LDLDIRECT" in the last 72 hours. Thyroid function studies No results for input(s): "TSH", "T4TOTAL", "T3FREE", "THYROIDAB" in the last 72 hours.  Invalid input(s): "FREET3" Anemia work up No results for input(s): "VITAMINB12", "FOLATE", "FERRITIN", "TIBC", "IRON", "RETICCTPCT" in the last 72 hours. Urinalysis    Component Value Date/Time   COLORURINE YELLOW 09/05/2021 0012   APPEARANCEUR CLEAR 09/05/2021  0012   LABSPEC 1.020 09/05/2021 0012   PHURINE 5.0 09/05/2021 0012   GLUCOSEU >=500 (A) 09/05/2021 0012   HGBUR SMALL (A) 09/05/2021 0012   BILIRUBINUR NEGATIVE 09/05/2021 0012   KETONESUR 5 (A) 09/05/2021 0012   PROTEINUR NEGATIVE 09/05/2021 0012   UROBILINOGEN 1.0 11/22/2014 1337   NITRITE NEGATIVE 09/05/2021 0012   LEUKOCYTESUR NEGATIVE 09/05/2021 0012   Sepsis Labs Recent Labs  Lab 01/23/22 2038 01/24/22 0756 01/25/22 0358  WBC 10.0 7.0 10.2   Microbiology No results found for this or any previous visit (from the past 240 hour(s)).   Time coordinating discharge: 25 minutes  SIGNED: Antonieta Pert, MD  [facility] 01/25/2022, 11:36 AM  If 7PM-7AM, please contact night-coverage [email or web address]

## 2022-01-25 NOTE — Progress Notes (Signed)
Mobility Specialist - Progress Note   01/25/22 0957  Mobility  Activity Ambulated independently in hallway  Level of Assistance Independent  Assistive Device None  Distance Ambulated (ft) 350 ft  Range of Motion/Exercises Active  Activity Response Tolerated well  Mobility Referral Yes  $Mobility charge 1 Mobility    Pt received in bed and agreeable to mobility. No complaints during ambulation. Pt questioned when he was going to be able to go home. Pt to bed after session with all needs met.    [facility]

## 2022-01-26 ENCOUNTER — Encounter: Payer: Self-pay | Admitting: Internal Medicine

## 2022-01-26 NOTE — Telephone Encounter (Signed)
Pts wife states pt is feeling well and doing good but still having large black stools. She reports the stools are formed and firm. She thinks it will just take some time for the old blood to clear out but she wanted to make sure she let Dr. Hilarie Fredrickson know. Dr. Hilarie Fredrickson notified.

## 2022-01-26 NOTE — Telephone Encounter (Signed)
Inbound call from patients wife she has been made aware of appt on 11/28, states patient is still having large black stools, please advise

## 2022-01-27 DIAGNOSIS — E1121 Type 2 diabetes mellitus with diabetic nephropathy: Secondary | ICD-10-CM | POA: Diagnosis not present

## 2022-01-27 DIAGNOSIS — I480 Paroxysmal atrial fibrillation: Secondary | ICD-10-CM | POA: Diagnosis not present

## 2022-01-27 DIAGNOSIS — Z8546 Personal history of malignant neoplasm of prostate: Secondary | ICD-10-CM | POA: Diagnosis not present

## 2022-01-27 DIAGNOSIS — R82998 Other abnormal findings in urine: Secondary | ICD-10-CM | POA: Diagnosis not present

## 2022-01-27 DIAGNOSIS — E782 Mixed hyperlipidemia: Secondary | ICD-10-CM | POA: Diagnosis not present

## 2022-01-27 DIAGNOSIS — Z1331 Encounter for screening for depression: Secondary | ICD-10-CM | POA: Diagnosis not present

## 2022-01-27 DIAGNOSIS — D509 Iron deficiency anemia, unspecified: Secondary | ICD-10-CM | POA: Diagnosis not present

## 2022-01-27 DIAGNOSIS — I739 Peripheral vascular disease, unspecified: Secondary | ICD-10-CM | POA: Diagnosis not present

## 2022-01-27 DIAGNOSIS — Z1339 Encounter for screening examination for other mental health and behavioral disorders: Secondary | ICD-10-CM | POA: Diagnosis not present

## 2022-01-27 DIAGNOSIS — K921 Melena: Secondary | ICD-10-CM | POA: Diagnosis not present

## 2022-01-27 DIAGNOSIS — Z0000 Encounter for general adult medical examination without abnormal findings: Secondary | ICD-10-CM | POA: Diagnosis not present

## 2022-01-27 DIAGNOSIS — I700 Atherosclerosis of aorta: Secondary | ICD-10-CM | POA: Diagnosis not present

## 2022-01-27 DIAGNOSIS — G4733 Obstructive sleep apnea (adult) (pediatric): Secondary | ICD-10-CM | POA: Diagnosis not present

## 2022-01-27 DIAGNOSIS — E890 Postprocedural hypothyroidism: Secondary | ICD-10-CM | POA: Diagnosis not present

## 2022-01-29 ENCOUNTER — Encounter (HOSPITAL_COMMUNITY): Payer: Self-pay | Admitting: Gastroenterology

## 2022-01-31 ENCOUNTER — Telehealth: Payer: Self-pay | Admitting: Pharmacy Technician

## 2022-01-31 ENCOUNTER — Other Ambulatory Visit: Payer: Self-pay

## 2022-01-31 ENCOUNTER — Other Ambulatory Visit (INDEPENDENT_AMBULATORY_CARE_PROVIDER_SITE_OTHER): Payer: Medicare Other

## 2022-01-31 ENCOUNTER — Encounter: Payer: Self-pay | Admitting: Internal Medicine

## 2022-01-31 ENCOUNTER — Other Ambulatory Visit: Payer: Self-pay | Admitting: Internal Medicine

## 2022-01-31 DIAGNOSIS — D500 Iron deficiency anemia secondary to blood loss (chronic): Secondary | ICD-10-CM | POA: Insufficient documentation

## 2022-01-31 DIAGNOSIS — D509 Iron deficiency anemia, unspecified: Secondary | ICD-10-CM | POA: Diagnosis not present

## 2022-01-31 LAB — IBC + FERRITIN
Ferritin: 10.4 ng/mL — ABNORMAL LOW (ref 22.0–322.0)
Iron: 41 ug/dL — ABNORMAL LOW (ref 42–165)
Saturation Ratios: 11.3 % — ABNORMAL LOW (ref 20.0–50.0)
TIBC: 362.6 ug/dL (ref 250.0–450.0)
Transferrin: 259 mg/dL (ref 212.0–360.0)

## 2022-01-31 LAB — CBC WITH DIFFERENTIAL/PLATELET
Basophils Absolute: 0 10*3/uL (ref 0.0–0.1)
Basophils Relative: 0.6 % (ref 0.0–3.0)
Eosinophils Absolute: 0.2 10*3/uL (ref 0.0–0.7)
Eosinophils Relative: 2.9 % (ref 0.0–5.0)
HCT: 26.9 % — ABNORMAL LOW (ref 39.0–52.0)
Hemoglobin: 8.6 g/dL — ABNORMAL LOW (ref 13.0–17.0)
Lymphocytes Relative: 17.8 % (ref 12.0–46.0)
Lymphs Abs: 1.1 10*3/uL (ref 0.7–4.0)
MCHC: 31.9 g/dL (ref 30.0–36.0)
MCV: 79.3 fl (ref 78.0–100.0)
Monocytes Absolute: 0.7 10*3/uL (ref 0.1–1.0)
Monocytes Relative: 11 % (ref 3.0–12.0)
Neutro Abs: 4.1 10*3/uL (ref 1.4–7.7)
Neutrophils Relative %: 67.7 % (ref 43.0–77.0)
Platelets: 242 10*3/uL (ref 150.0–400.0)
RBC: 3.39 Mil/uL — ABNORMAL LOW (ref 4.22–5.81)
RDW: 20.6 % — ABNORMAL HIGH (ref 11.5–15.5)
WBC: 6.1 10*3/uL (ref 4.0–10.5)

## 2022-01-31 NOTE — Telephone Encounter (Signed)
Dr. Hilarie Fredrickson, Juluis Rainier note:  Auth Submission: NO AUTH NEEDED Payer: MEDICARE A/B & AARP Medication & CPT/J Code(s) submitted: Feraheme (ferumoxytol) [id number] Route of submission (phone, fax, portal):  Phone # [phone] Fax # Auth type: Buy/Bill Units/visits requested: X2 Reference number:  Approval from: 01/31/22 to 04/10/22   Patient will be scheduled as soon as possible

## 2022-02-01 ENCOUNTER — Ambulatory Visit (INDEPENDENT_AMBULATORY_CARE_PROVIDER_SITE_OTHER): Payer: Medicare Other | Admitting: *Deleted

## 2022-02-01 VITALS — BP 118/70 | HR 57 | Temp 98.2°F | Resp 16 | Ht 67.0 in | Wt 225.0 lb

## 2022-02-01 DIAGNOSIS — K922 Gastrointestinal hemorrhage, unspecified: Secondary | ICD-10-CM | POA: Diagnosis not present

## 2022-02-01 DIAGNOSIS — D500 Iron deficiency anemia secondary to blood loss (chronic): Secondary | ICD-10-CM | POA: Diagnosis not present

## 2022-02-01 MED ORDER — DIPHENHYDRAMINE HCL 25 MG PO CAPS
25.0000 mg | ORAL_CAPSULE | Freq: Once | ORAL | Status: AC
  Administered 2022-02-01: 25 mg via ORAL
  Filled 2022-02-01: qty 1

## 2022-02-01 MED ORDER — ACETAMINOPHEN 325 MG PO TABS
650.0000 mg | ORAL_TABLET | Freq: Once | ORAL | Status: AC
  Administered 2022-02-01: 650 mg via ORAL
  Filled 2022-02-01: qty 2

## 2022-02-01 MED ORDER — SODIUM CHLORIDE 0.9 % IV SOLN
510.0000 mg | Freq: Once | INTRAVENOUS | Status: AC
  Administered 2022-02-01: 510 mg via INTRAVENOUS
  Filled 2022-02-01: qty 17

## 2022-02-01 NOTE — Progress Notes (Signed)
Diagnosis: Iron Deficiency Anemia  Provider:  Marshell Garfinkel MD  Procedure: Infusion  IV Type: Peripheral, IV Location: L Forearm  Feraheme (Ferumoxytol), Dose: 510 mg  Infusion Start Time: 7290 am  Infusion Stop Time: 1110 am  Post Infusion IV Care: Observation period completed and Peripheral IV Discontinued  Discharge: Condition: Good, Destination: Home . AVS provided to patient.   Performed by:  Oren Beckmann, RN

## 2022-02-01 NOTE — Telephone Encounter (Signed)
See additional notes, pt came in for labs.

## 2022-02-08 ENCOUNTER — Encounter (HOSPITAL_BASED_OUTPATIENT_CLINIC_OR_DEPARTMENT_OTHER): Payer: Self-pay | Admitting: Cardiovascular Disease

## 2022-02-08 ENCOUNTER — Telehealth (HOSPITAL_BASED_OUTPATIENT_CLINIC_OR_DEPARTMENT_OTHER): Payer: Self-pay | Admitting: Cardiovascular Disease

## 2022-02-08 ENCOUNTER — Ambulatory Visit (INDEPENDENT_AMBULATORY_CARE_PROVIDER_SITE_OTHER): Payer: Medicare Other

## 2022-02-08 ENCOUNTER — Encounter: Payer: Self-pay | Admitting: Internal Medicine

## 2022-02-08 VITALS — BP 145/68 | HR 63 | Temp 99.0°F | Resp 16 | Ht 67.0 in | Wt 221.8 lb

## 2022-02-08 DIAGNOSIS — D500 Iron deficiency anemia secondary to blood loss (chronic): Secondary | ICD-10-CM

## 2022-02-08 MED ORDER — ANTICOAGULANT SODIUM CITRATE 4% (200MG/5ML) IV SOLN: 5.0000 mL | Freq: Once | Status: DC | PRN

## 2022-02-08 MED ORDER — SODIUM CHLORIDE 0.9 % IV SOLN: Freq: Once | INTRAVENOUS | Status: AC | PRN

## 2022-02-08 MED ORDER — FAMOTIDINE IN NACL 20-0.9 MG/50ML-% IV SOLN
20.0000 mg | Freq: Once | INTRAVENOUS | Status: AC | PRN
  Administered 2022-02-08: 20 mg via INTRAVENOUS

## 2022-02-08 MED ORDER — SODIUM CHLORIDE 0.9% FLUSH: 3.0000 mL | Freq: Once | INTRAVENOUS | Status: DC | PRN

## 2022-02-08 MED ORDER — ACETAMINOPHEN 325 MG PO TABS: 650.0000 mg | ORAL_TABLET | Freq: Once | ORAL | Status: DC

## 2022-02-08 MED ORDER — SODIUM CHLORIDE 0.9 % IV SOLN
510.0000 mg | Freq: Once | INTRAVENOUS | Status: AC
  Administered 2022-02-08: 510 mg via INTRAVENOUS
  Filled 2022-02-08: qty 17

## 2022-02-08 MED ORDER — ALBUTEROL SULFATE HFA 108 (90 BASE) MCG/ACT IN AERS: 2.0000 | INHALATION_SPRAY | Freq: Once | RESPIRATORY_TRACT | Status: DC | PRN

## 2022-02-08 MED ORDER — EPINEPHRINE 0.3 MG/0.3ML IJ SOAJ: 0.3000 mg | Freq: Once | INTRAMUSCULAR | Status: DC | PRN

## 2022-02-08 MED ORDER — DIPHENHYDRAMINE HCL 25 MG PO CAPS: 25.0000 mg | ORAL_CAPSULE | Freq: Once | ORAL | Status: DC

## 2022-02-08 MED ORDER — METHYLPREDNISOLONE SODIUM SUCC 125 MG IJ SOLR
125.0000 mg | Freq: Once | INTRAMUSCULAR | Status: AC | PRN
  Administered 2022-02-08: 125 mg via INTRAVENOUS

## 2022-02-08 MED ORDER — HEPARIN SOD (PORK) LOCK FLUSH 100 UNIT/ML IV SOLN: 250.0000 [IU] | Freq: Once | INTRAVENOUS | Status: DC | PRN

## 2022-02-08 MED ORDER — SODIUM CHLORIDE 0.9% FLUSH: 10.0000 mL | Freq: Once | INTRAVENOUS | Status: DC | PRN

## 2022-02-08 MED ORDER — DIPHENHYDRAMINE HCL 50 MG/ML IJ SOLN
50.0000 mg | Freq: Once | INTRAMUSCULAR | Status: AC | PRN
  Administered 2022-02-08: 50 mg via INTRAVENOUS

## 2022-02-08 MED ORDER — HEPARIN SOD (PORK) LOCK FLUSH 100 UNIT/ML IV SOLN: 500.0000 [IU] | Freq: Once | INTRAVENOUS | Status: DC | PRN

## 2022-02-08 MED ORDER — ALTEPLASE 2 MG IJ SOLR: 2.0000 mg | Freq: Once | INTRAMUSCULAR | Status: DC | PRN

## 2022-02-08 NOTE — Telephone Encounter (Signed)
New Message:     Patient wife called. She said patient was having an Iron Infusion this morning. During the infusion, he had an episode of lightheaded, nausea and chest pain. They stopped the infusion and gave him Benadryl. At this time he feels better. They will start back doing the infusion shortly. The doctor told her to call and report this to Dr Oval Linsey ,to see if there was anything she wanted to do.

## 2022-02-08 NOTE — Progress Notes (Signed)
Diagnosis: Iron Deficiency Anemia  Provider:  Marshell Garfinkel MD  Procedure: Infusion  IV Type: Peripheral, IV Location: L Forearm  Feraheme (Ferumoxytol), Dose: 510 mg  Infusion Start Time: 5462  Infusion Stop Time: 7035- Pt began complaining of chest pain, nausea, and shortness of breath. Infusion stopped. Emergency protocol activated. Pt given Solumedrol '125mg'$ , Benadryl '50mg'$  IV, and Pepcid '20mg'$  IVPB. Pt initially declined pre-meds due to no issues with previous infusion.Dr. Silas Flood from pulmonary came over to assess pt. Vital signs remained stable. O2Sats 98% on room air. Chest pain and shortness of breath decreased after about 10 minutes following med administration. Spouse did place call to pts cardiologist per Dr. Kavin Leech recommendation.   Pepcid (Famotidine), Dose: 20 mg  Infusion Start Time: 1025  Infusion Stop Time: 1050   Feraheme (Ferumoxytol), Dose: 510 mg  Infusion Start Time: 1100 - Restarted at 252m/hr 11:05- Pt tolerating infusion without any signs and symptoms of reaction.   Infusion Stop Time: 10093 Post Infusion IV Care: Observation period completed and Peripheral IV Discontinued. Update given to Dr. HSilas Flood  Discharge: Condition: Good, Destination: Home . AVS provided to patient.   Performed by:  CKoren Shiver RN

## 2022-02-08 NOTE — Telephone Encounter (Signed)
Pt c/o of Chest Pain: STAT if CP now or developed within 24 hours  1. Are you having CP right now? No, had chest pains while he was having Iron Infusion earlier this morning  2. Are you experiencing any other symptoms (ex. SOB, nausea, vomiting, sweating)? He did feel nauseated and lightheaded also while having infusion  3. How long have you been experiencing CP? Just started when he was his Iron Infusion this morning(02-08-22)  4. Is your CP continuous or coming and going?   5. Have you taken Nitroglycerin?  ?

## 2022-02-08 NOTE — Telephone Encounter (Signed)
FYI

## 2022-02-08 NOTE — Telephone Encounter (Signed)
Duplicate encounter, will respond to separate encounter.

## 2022-02-09 NOTE — Telephone Encounter (Signed)
Kardia strip does look like atrial fibrillation. This is not surprising given recent stressors with anemia, reaction to iron, hospitalization, etc. His rate is well controlled which is good so would recommend continuation of Carvedilol at present dose. So long as he remains on Xarelto, he is protected from stroke. No indication for any changes at this time. Continue Carvedilol, Xarelto. Would recommend office visit in 2 weeks with EKG to reassess rhythm. As he has recently been off of Xarelto we would not be able to schedule cardioversion until at least 3 weeks of uninterrupted Xarelto. If he is severely symptomatic (shortness of breath, palpitations) could schedule follow up for next week instead.  Best,  Loel Dubonnet, NP

## 2022-02-09 NOTE — Telephone Encounter (Signed)
Please see EKG readings from [facility] and advise.

## 2022-02-17 ENCOUNTER — Other Ambulatory Visit (HOSPITAL_BASED_OUTPATIENT_CLINIC_OR_DEPARTMENT_OTHER): Payer: Self-pay | Admitting: Cardiovascular Disease

## 2022-02-17 NOTE — Telephone Encounter (Signed)
Rx request sent to pharmacy.  

## 2022-02-21 ENCOUNTER — Ambulatory Visit (INDEPENDENT_AMBULATORY_CARE_PROVIDER_SITE_OTHER): Payer: Medicare Other | Admitting: Family

## 2022-02-21 ENCOUNTER — Encounter (HOSPITAL_BASED_OUTPATIENT_CLINIC_OR_DEPARTMENT_OTHER): Payer: Self-pay | Admitting: Family

## 2022-02-21 VITALS — BP 140/92 | HR 62 | Ht 67.0 in | Wt 226.0 lb

## 2022-02-21 DIAGNOSIS — I4819 Other persistent atrial fibrillation: Secondary | ICD-10-CM | POA: Diagnosis not present

## 2022-02-21 DIAGNOSIS — I10 Essential (primary) hypertension: Secondary | ICD-10-CM

## 2022-02-21 DIAGNOSIS — D6859 Other primary thrombophilia: Secondary | ICD-10-CM | POA: Diagnosis not present

## 2022-02-21 DIAGNOSIS — D500 Iron deficiency anemia secondary to blood loss (chronic): Secondary | ICD-10-CM

## 2022-02-21 DIAGNOSIS — E782 Mixed hyperlipidemia: Secondary | ICD-10-CM

## 2022-02-21 DIAGNOSIS — Z8673 Personal history of transient ischemic attack (TIA), and cerebral infarction without residual deficits: Secondary | ICD-10-CM

## 2022-02-21 NOTE — Progress Notes (Signed)
Office Visit    Patient Name: [name] [name] Date of Encounter: 02/21/2022  PCP:  Donnajean Lopes, MD   [place]  Cardiologist:  Skeet Latch, MD  Advanced Practice Provider:  No care team member to display Electrophysiologist:  None      Chief Complaint    [name] [name] is a 71 y.o. male presents today for atrial fibrillation   Past Medical History    Past Medical History:  Diagnosis Date   Arthritis    OA   Atrial flutter ([place])    a. s/p RFCA 8/13   Cancer ([place]) 2010   prostate   Chronic combined systolic and diastolic CHF (congestive heart failure) ([place])    a. LVEF previously 35% felt to be due tachycardia;  b. 02/2013 Echo: EF 50-55%, no rwma, mildly dil LA/RA, mild to mod MR. C 11/2014 echo - ef 60-65%, unable to determine DD   CKD (chronic kidney disease), stage III (HCC)    hx of a/c renal failure during episode of diverticulitis 9/13 in [place]   Diabetes mellitus ([place])    TYPE 2    Diverticulitis    Diverticulosis    Dyslipidemia    Dysrhythmia    Erectile dysfunction    Gastric ulcer    s/p prior surgery   GERD (gastroesophageal reflux disease)    Headache    History of colonic diverticulitis    History of prostate cancer    Hypertension    Hypothyroid    Kidney stones    Microcytic anemia    Obesities, morbid ([place])    Obstructive sleep apnea    noncompliant with CPAP   Osteoporosis    PAF (paroxysmal atrial fibrillation) ([place])    a. failed DCCV and multiple anti-arrhythmic drugs (multaq/flecainide);   b. s/p  PVI isolation with ablation of AFib 8/13; c. repeat PVI 01/2013;  d. 02/2013 repeat DCCV->Amio load/xarelto.   Peptic ulcer disease 1994   Renal calculi    Sleep apnea 2015   has not had sleep apnea since heart intervention   Thrombocytopenia ([place]) 1992   idopathic-treated with danazol   Tubular adenoma of colon    Past Surgical History:  Procedure Laterality Date   ATRIAL FIBRILLATION  ABLATION  12/06/11; 02/05/2013   Afib and atrial flutter ablation by Dr Rayann Heman; repeat PVI by Dr Rayann Heman 02/05/2013   ATRIAL FIBRILLATION ABLATION N/A 12/06/2011   Procedure: ATRIAL FIBRILLATION ABLATION;  Surgeon: Thompson Grayer, MD;  Location: [facility] CATH LAB;  Service: Cardiovascular;  Laterality: N/A;   ATRIAL FIBRILLATION ABLATION N/A 02/05/2013   Procedure: ATRIAL FIBRILLATION ABLATION;  Surgeon: Coralyn Mark, MD;  Location: [place] CATH LAB;  Service: Cardiovascular;  Laterality: N/A;   benign stomach tumor removal  2000   CARDIOVERSION  08/25/2011   Procedure: CARDIOVERSION;  Surgeon: Jettie Booze, MD;  Location: [place];  Service: Cardiovascular;  Laterality: N/A;   CARDIOVERSION N/A 03/05/2013   Procedure: CARDIOVERSION;  Surgeon: Sinclair Grooms, MD;  Location: [place];  Service: Cardiovascular;  Laterality: N/A;  BEDSIDE    CARDIOVERSION N/A 03/25/2020   Procedure: CARDIOVERSION;  Surgeon: Skeet Latch, MD;  Location: [place];  Service: Cardiovascular;  Laterality: N/A;   CARDIOVERSION N/A 06/02/2020   Procedure: CARDIOVERSION;  Surgeon: Pixie Casino, MD;  Location: [place];  Service: Cardiovascular;  Laterality: N/A;   CARPAL [place] Right 02/25/2020   Procedure: RIGHT CARPAL TUNNEL RELEASE, CUBITAL TUNNEL RELEASE IN  SITU, RIGHT WRIST PROXIMAL ROW CARPECTOMY AND PROXIMAL CAPITATE HEAD REPLACEMENT/HEMIARTHROPLASTY WITH POSTERIOR INTEROSSOUS NERVE NEURECTOMY AND REPAIR;  Surgeon: Roseanne Kaufman, MD;  Location: [place];  Service: Orthopedics;  Laterality: Right;  2.5 hrs Block with IV Sedation   convergent afib abaltion [facility] 02/26/15  02/26/2015   [facility] by Dr. Lehman Prom and Dr. Danise Mina   ELECTROPHYSIOLOGIC STUDY N/A 11/24/2014   Procedure: Cardioversion;  Surgeon: Will Meredith Leeds, MD;  Location: [place] CV LAB;  Service: Cardiovascular;  Laterality: N/A;   ESOPHAGOGASTRODUODENOSCOPY N/A 04/09/2013   Procedure: ESOPHAGOGASTRODUODENOSCOPY  (EGD) with possible Balloon Dilation;  Surgeon: Garlan Fair, MD;  Location: [facility] ENDOSCOPY;  Service: Endoscopy;  Laterality: N/A;   ESOPHAGOGASTRODUODENOSCOPY (EGD) WITH PROPOFOL N/A 01/24/2022   Procedure: ESOPHAGOGASTRODUODENOSCOPY (EGD) WITH PROPOFOL;  Surgeon: Doran Stabler, MD;  Location: [facility] ENDOSCOPY;  Service: Gastroenterology;  Laterality: N/A;   HEMOSTASIS CLIP PLACEMENT  01/24/2022   Procedure: HEMOSTASIS CLIP PLACEMENT;  Surgeon: Doran Stabler, MD;  Location: [facility] ENDOSCOPY;  Service: Gastroenterology;;   kidney stone removal     multiple   KNEE ARTHROSCOPY Bilateral 1979   LAPAROSCOPIC RETROPUBIC PROSTATECTOMY  02/2007   hx prostate cancer   ORIF ANKLE FRACTURE Right 10/2015   ORIF ANKLE FRACTURE Right 11/03/2015   Procedure: ORIF RIGHT LISFRANK FOOT;  Surgeon: Newt Minion, MD;  Location: [place];  Service: Orthopedics;  Laterality: Right;   SUBMUCOSAL INJECTION  01/24/2022   Procedure: SUBMUCOSAL INJECTION;  Surgeon: Doran Stabler, MD;  Location: [facility] ENDOSCOPY;  Service: Gastroenterology;;   TEE WITHOUT CARDIOVERSION  08/25/2011   Procedure: TRANSESOPHAGEAL ECHOCARDIOGRAM (TEE);  Surgeon: Jettie Booze, MD;  Location: [place];  Service: Cardiovascular;  Laterality: N/A;   TEE WITHOUT CARDIOVERSION  11/09/2011   Procedure: TRANSESOPHAGEAL ECHOCARDIOGRAM (TEE);  Surgeon: Jettie Booze, MD;  Location: [place];  Service: Cardiovascular;  Laterality: N/A;  h/p in file drawer   TEE WITHOUT CARDIOVERSION N/A 02/05/2013   Procedure: TRANSESOPHAGEAL ECHOCARDIOGRAM (TEE);  Surgeon: Candee Furbish, MD;  Location: [facility] ENDOSCOPY;  Service: Cardiovascular;  Laterality: N/A;   TEE WITHOUT CARDIOVERSION N/A 03/25/2020   Procedure: TRANSESOPHAGEAL ECHOCARDIOGRAM (TEE);  Surgeon: Skeet Latch, MD;  Location: [place];  Service: Cardiovascular;  Laterality: N/A;   TOTAL THYROIDECTOMY  2009   benign nodules removed   UPPER GASTROINTESTINAL ENDOSCOPY      WRIST SURGERY Right 1977   with placement of lunate prosthesis    Allergies  Allergies  Allergen Reactions   Bystolic [Nebivolol Hcl] Swelling and Other (See Comments)    Bradycardia    Calcium Channel Blockers Swelling and Other (See Comments)    LE edema   Feraheme [Ferumoxytol] Shortness Of Breath, Nausea And Vomiting and Other (See Comments)    Chest pain   Phenergan [Promethazine Hcl] Itching and Other (See Comments)    Hallucination    Beta Adrenergic Blockers Swelling and Other (See Comments)    bradycardia   Cardizem [Diltiazem] Other (See Comments)    Foot swelling and increased his heart rate   Norvasc [Amlodipine] Other (See Comments)    Unknown reaction   Aspirin Other (See Comments)    Hx of ulcer    Nsaids Other (See Comments)    Hx ulcer   Prednisone Other (See Comments)    Hx of ulcer    History of Present Illness    [name] [name] is a 71 y.o. male with a hx of PAF on Xarelto s/p ablation x2 ([facility]), HFpEF, CKDIIIa,  DM2, IDA, HTN, HLD, hypothyroidism, depression/anxiety, OSA intolerant to CPAP using mouth guard last seen 09/08/21 by Dr. Oval Linsey.  Most recent atrial fibrillation ablation at [facility] 2016.  This was reportedly complicated by cardiogenic shock requiring 4 days in ICU and intubation.  Saw Dr. Emeterio Reeve 03/2020 with recurrent atrial flutter after recent surgery.  Underwent TEE/DCCV 03/2020.  He was started back on dronedarone which he had previously tolerated.  He had rate controlled atrial fibrillation 05/26/2020 triggered by UTI and successfully cardioverted 06/02/2020.  Last seen 09/08/2021 by Dr. Oval Linsey doing well from a cardiac perspective and maintaining sinus rhythm.  Admitted 10/15-10/17/23 with symptomatic anemia with upper GI bleed. 01/24/22 EGD with nonbleeding linear gastric ulcer prepyloric region - injected with epi and 3 clips. He was recommended to hold Xarelto 5 days post procedure. He did have outpatient iron infusion and had allergic  reaction.   They sent MyChart message with Jodelle Red strips showing likely rate controlled atrial fibrillation.   He presents today for follow up with his wife. He went back into normal rhythm about a week after it happened recurred. It would go in and out for  a few days then gradually got back in rhythm. Not having any bleeding on Xarelto. Notes atrial fibrillation was symptomatic with fatigue. Has since been able to return to doing yard work.  EKGs/Labs/Other Studies Reviewed:   The following studies were reviewed today:   EKG:  EKG is ordered today.  The ekg ordered today demonstrates NSR 62 bpm with occasional PVC. No acute ST/T wave changes.   Recent Labs: 01/23/2022: ALT 15 01/25/2022: BUN 30; Creatinine, Ser 1.20; Magnesium 2.0; Potassium 3.7; Sodium 141 01/31/2022: Hemoglobin 8.6 Repeated and verified X2.; Platelets 242.0  Recent Lipid Panel    Component Value Date/Time   CHOL 181 07/24/2013 0530   TRIG 99 07/24/2013 0530   HDL 52 07/24/2013 0530   CHOLHDL 3.5 07/24/2013 0530   VLDL 20 07/24/2013 0530   LDLCALC 109 (H) 07/24/2013 0530    Risk Assessment/Calculations:   CHA2DS2-VASc Score = 6   This indicates a 9.7% annual risk of stroke. The patient's score is based upon: CHF History: 1 HTN History: 1 Diabetes History: 1 Stroke History: 2 Vascular Disease History: 0 Age Score: 1 Gender Score: 0     Home Medications   Current Meds  Medication Sig   acetaminophen (TYLENOL) 500 MG tablet Take 1,000 mg by mouth every 6 (six) hours as needed for moderate pain or headache.   carvedilol (COREG) 12.5 MG tablet TAKE 3 TABLETS BY MOUTH TWICE DAILY   DULoxetine (CYMBALTA) 60 MG capsule Take 60 mg by mouth daily.   FOLIC ACID PO Take 8,295 mcg by mouth daily.   Glucosamine HCl 1500 MG TABS Take 1,500 mg by mouth in the morning and at bedtime.   JARDIANCE 25 MG TABS tablet Take 25 mg by mouth daily.   levothyroxine (SYNTHROID) 175 MCG tablet Take 175 mcg by mouth daily.    losartan (COZAAR) 50 MG tablet Take 50 mg by mouth every evening.   Meclizine HCl (BONINE PO) Take 1 tablet by mouth daily as needed (nausea).   metFORMIN (GLUCOPHAGE-XR) 500 MG 24 hr tablet Take 1,000 mg by mouth 2 (two) times daily.   MULTAQ 400 MG tablet TAKE 1 TABLET TWICE DAILY  WITH MEALS (Patient taking differently: Take 400 mg by mouth 2 (two) times daily with a meal.)   Multiple Vitamin (MULTIVITAMIN WITH MINERALS) TABS tablet Take 1 tablet by mouth daily.  One-A-Day for Men   Multiple Vitamins-Minerals (PRESERVISION AREDS 2) CAPS Take 1 capsule by mouth 2 (two) times daily.   pantoprazole (PROTONIX) 40 MG tablet Take 1 tablet (40 mg total) by mouth 2 (two) times daily.   polycarbophil (FIBERCON) 625 MG tablet Take 1,250 mg by mouth every evening.   Probiotic Product (PROBIOTIC PO) Take 1 capsule by mouth at bedtime.   rosuvastatin (CRESTOR) 10 MG tablet Take 1 tablet by mouth once daily   TRULICITY 1.96 QI/2.9NL SOPN Inject 1.5 mg into the skin once a week. Tuesdays   vitamin B-12 (CYANOCOBALAMIN) 1000 MCG tablet Take 1,000 mcg by mouth daily.   Vitamin D, Ergocalciferol, (DRISDOL) 1.25 MG (50000 UNIT) CAPS capsule Take 50,000 Units by mouth every 7 (seven) days. Tuesdays   XARELTO 15 MG TABS tablet Take 1 tablet (15 mg total) by mouth daily. Hold for 5 days from 10/16 when you had endoscopy   Current Facility-Administered Medications for the 02/21/22 encounter (Office Visit) with Loel Dubonnet, NP  Medication   0.9 %  sodium chloride infusion     Review of Systems      All other systems reviewed and are otherwise negative except as noted above.  Physical Exam    VS:  BP (!) 140/92   Pulse 62   Ht '5\' 7"'$  (1.702 m)   Wt 226 lb (102.5 kg)   BMI 35.40 kg/m  , BMI Body mass index is 35.4 kg/m.  Wt Readings from Last 3 Encounters:  02/21/22 226 lb (102.5 kg)  02/08/22 221 lb 12.8 oz (100.6 kg)  02/01/22 225 lb (102.1 kg)     GEN: Well nourished, well developed, in no  acute distress. HEENT: normal. Neck: Supple, no JVD, carotid bruits, or masses. Cardiac: RRR, no murmurs, rubs, or gallops. No clubbing, cyanosis, edema.  Radials/PT 2+ and equal bilaterally.  Respiratory:  Respirations regular and unlabored, clear to auscultation bilaterally. GI: Soft, nontender, nondistended. MS: No deformity or atrophy. Skin: Warm and dry, no rash. Neuro:  Strength and sensation are intact. Psych: Normal affect.  Assessment & Plan    Persistent atrial fibrillation/atrial flutter/hypercoagulable state/ High risk medication use- s/p cardioversion2/2022. Recurrent atrial fibrillation 2 weeks ago in setting of reaction to iron. Has since self converted to NSR with PVCs. PVCs overall not bothersome. Continue Coreg, Multaq, Xarelto. Udpate CBC, BMP, iron panel today to ensure no recurrent anemia and correct Xarelto dose.   Hypertension- BP well controlled at home. Continue current antihypertensive regimen.    Hyperlipidemia-  Continue Rosuvastatin. LDL goal <70 due to hx of TIA. Continue to follow with PCP.   Hx of TIA- Continue Rosuvastatin, Xarelto.       Disposition: Follow up  as scheduled  with Skeet Latch, MD per patient request.  Signed, Loel Dubonnet, NP 02/21/2022, 1:58 PM [place]

## 2022-02-21 NOTE — Patient Instructions (Signed)
Medication Instructions:  Your Physician recommend you continue on your current medication as directed.    *If you need a refill on your cardiac medications before your next appointment, please call your pharmacy*   Lab Work: Your physician recommends that you return for lab work today- CBC, BMP, Iron Panel   If you have labs (blood work) drawn today and your tests are completely normal, you will receive your results only by: MyChart Message (if you have [place]) OR A paper copy in the mail If you have any lab test that is abnormal or we need to change your treatment, we will call you to review the results.  Follow-Up: At [facility], you and your health needs are our priority.  As part of our continuing mission to provide you with exceptional heart care, we have created designated Provider Care Teams.  These Care Teams include your primary Cardiologist (physician) and Advanced Practice Providers (APPs -  Physician Assistants and Nurse Practitioners) who all work together to provide you with the care you need, when you need it.  We recommend signing up for the patient portal called "MyChart".  Sign up information is provided on this After Visit Summary.  MyChart is used to connect with patients for Virtual Visits (Telemedicine).  Patients are able to view lab/test results, encounter notes, upcoming appointments, etc.  Non-urgent messages can be sent to your provider as well.   To learn more about what you can do with MyChart, go to [email or web address].    Your next appointment:   Follow up as scheduled   Other Instructions Heart Healthy Diet Recommendations: A low-salt diet is recommended. Meats should be grilled, baked, or boiled. Avoid fried foods. Focus on lean protein sources like fish or chicken with vegetables and fruits. The American Heart Association is a Microbiologist!  American Heart Association Diet and Lifeystyle Recommendations   Exercise recommendations: The  American Heart Association recommends 150 minutes of moderate intensity exercise weekly. Try 30 minutes of moderate intensity exercise 4-5 times per week. This could include walking, jogging, or swimming.   Important Information About Sugar

## 2022-02-22 ENCOUNTER — Telehealth (HOSPITAL_BASED_OUTPATIENT_CLINIC_OR_DEPARTMENT_OTHER): Payer: Self-pay

## 2022-02-22 LAB — IRON,TIBC AND FERRITIN PANEL
Ferritin: 145 ng/mL (ref 30–400)
Iron Saturation: 17 % (ref 15–55)
Iron: 56 ug/dL (ref 38–169)
Total Iron Binding Capacity: 337 ug/dL (ref 250–450)
UIBC: 281 ug/dL (ref 111–343)

## 2022-02-22 LAB — CBC
Hematocrit: 36.6 % — ABNORMAL LOW (ref 37.5–51.0)
Hemoglobin: 11.8 g/dL — ABNORMAL LOW (ref 13.0–17.7)
MCH: 28.4 pg (ref 26.6–33.0)
MCHC: 32.2 g/dL (ref 31.5–35.7)
MCV: 88 fL (ref 79–97)
Platelets: 188 10*3/uL (ref 150–450)
RBC: 4.16 x10E6/uL (ref 4.14–5.80)
RDW: 20.1 % — ABNORMAL HIGH (ref 11.6–15.4)
WBC: 6.9 10*3/uL (ref 3.4–10.8)

## 2022-02-22 LAB — BASIC METABOLIC PANEL
BUN/Creatinine Ratio: 9 — ABNORMAL LOW (ref 10–24)
BUN: 13 mg/dL (ref 8–27)
CO2: 18 mmol/L — ABNORMAL LOW (ref 20–29)
Calcium: 8.8 mg/dL (ref 8.6–10.2)
Chloride: 108 mmol/L — ABNORMAL HIGH (ref 96–106)
Creatinine, Ser: 1.42 mg/dL — ABNORMAL HIGH (ref 0.76–1.27)
Glucose: 105 mg/dL — ABNORMAL HIGH (ref 70–99)
Potassium: 4.3 mmol/L (ref 3.5–5.2)
Sodium: 144 mmol/L (ref 134–144)
eGFR: 53 mL/min/{1.73_m2} — ABNORMAL LOW (ref >59)

## 2022-02-22 MED ORDER — RIVAROXABAN 20 MG PO TABS: 20.0000 mg | ORAL_TABLET | Freq: Every day | ORAL | 3 refills | Status: DC

## 2022-02-22 NOTE — Telephone Encounter (Signed)
Seen by patient [name] [name] on 02/22/2022 12:06 PM; follow up mychart message sent to patient and Rx updated to the pharmacy.

## 2022-02-22 NOTE — Telephone Encounter (Signed)
-----   Message from Loel Dubonnet, NP sent at 02/22/2022 10:49 AM EST ----- Anemia improving compared to previous. Iron levels normal. CCing Dr. Hilarie Fredrickson as FYI only.   Kidney function overall stable but slightly decreased from last draw. Recommend increasing oral hydration. Reviewed with pharmacist - based on creatinine clearance for adequate protection needs to return to Xarelto '20mg'$  daily.

## 2022-03-02 ENCOUNTER — Encounter: Payer: Self-pay | Admitting: Internal Medicine

## 2022-03-02 ENCOUNTER — Telehealth (HOSPITAL_BASED_OUTPATIENT_CLINIC_OR_DEPARTMENT_OTHER): Payer: Self-pay

## 2022-03-02 NOTE — Telephone Encounter (Signed)
Patient assistance delivered to office, patient notified via mychart message

## 2022-03-07 NOTE — Progress Notes (Unsigned)
03/08/2022 WHEELER INCORVAIA [id number] 12/03/1950  Referring provider: Donnajean Lopes, MD Primary GI doctor: Dr. Hilarie Fredrickson  ASSESSMENT AND PLAN:   Iron deficiency anemia due to chronic blood loss secondary to benign gastric polypectomy on 10/06 Repeat 10/11 s/p clips and epi due to gastric ulcer polypectomy site Had 2 IV iron transfusions Repeat CBC 11/13 was 11.8, no symptoms at this time.  Declines labs here, will get at cardiologist.  No need to repeat EGD at this time  Gastric polyp with history of GERD Continue indefinitely on PPI once a day with history of GERD and anticoagulation.   Personal history of colonic polyps 08/25/2020 colonoscopy 8 tubular adenomatous polyps removed, recall 3 years  Atrial fibrillation with RVR ([place]) On xarelto, wife is interested in eliquis, told them to discuss with cardiology.    History of Present Illness:  71 y.o. male  with a past medical history of hypertension, hyperlipidemia, atrial fibrillation s/p ablation x 2 cardioversion 05/2020 on Xarelo, chronic diastolic CHF, TIA 3329, diabetes mellitus type 2, CKD stage III, sleep apnea uses a mouth guard, prostate cancer, GERD, diverticulitis and colon polyps  and others listed below, returns to clinic today for evaluation of post polypectomy.  04/09/2013 EGD by Dr. Wynetta Emery unremarkable 08/25/2020 colonoscopy 8 tubular adenomatous polyps removed, recall 3 years 01/05/2022 office visit with NP Berniece Pap in our office for evaluation of IDA, unremarkable colonoscopy May 2022 other than 8 tubular adenomatous polyps recall 3 years. 01/17/2022 EGD with gastric polyp removed and prophylactic clip placed.   Patient called the office 01/23/2022 after being back on Xarelto for 4 days with soft black stools, nausea.  Patient was advised to stop Xarelto, continue PPI and presented to the ER. In the ER patient had hemoglobin of 8.1 compared to 11.62 weeks prior.   01/24/2022 EGD Dr. Loletha Carrow in the  hospital for melena and anemia nonbleeding linear gastric ulcer prepyloric region at the polypectomy site in the 8 to 10 mm largest diameter, injected with epi, 3 clips placed.   Wife is here with him and provides some of the history.  Patient placed on outpatient iron twice daily, pantoprazole 40 mg twice daily for 2 to 3 months and here for follow-up. He has had 2 iron infusions, had reaction to second iron infusion on 02/08/22 which caused Afib and he has not had any more oral iron since that time.  Last Cbc 02/21/2022 was 11.8 up from 8.6 with iron of 56 and ferritin of 145.  He states he is not having any further fatigue, no SOB, CP, dizziness.  He just went down to the once a day PPI.  States this controls his GERD, no nausea, no vomiting, no AB pain, no melena.  He is back on the xarelto.   He  reports that he has never smoked. He has never used smokeless tobacco. He reports that he does not drink alcohol and does not use drugs. His family history includes Atrial fibrillation in his brother; Colon polyps in his brother, sister, and sister; Diabetes in his brother; Emphysema in his mother; Lung cancer in his father and sister; Throat cancer in his sister.   Current Medications:   Current Outpatient Medications (Endocrine & Metabolic):    JARDIANCE 25 MG TABS tablet, Take 25 mg by mouth daily.   levothyroxine (SYNTHROID) 175 MCG tablet, Take 175 mcg by mouth daily.   metFORMIN (GLUCOPHAGE-XR) 500 MG 24 hr tablet, Take 1,000 mg by mouth 2 (two) times daily.  TRULICITY 6.37 CH/8.8FO SOPN, Inject 1.5 mg into the skin once a week. Tuesdays   Current Outpatient Medications (Cardiovascular):    carvedilol (COREG) 12.5 MG tablet, TAKE 3 TABLETS BY MOUTH TWICE DAILY   losartan (COZAAR) 50 MG tablet, Take 50 mg by mouth every evening.   MULTAQ 400 MG tablet, TAKE 1 TABLET TWICE DAILY  WITH MEALS (Patient taking differently: Take 400 mg by mouth 2 (two) times daily with a meal.)    rosuvastatin (CRESTOR) 10 MG tablet, Take 1 tablet by mouth once daily     Current Outpatient Medications (Analgesics):    acetaminophen (TYLENOL) 500 MG tablet, Take 1,000 mg by mouth every 6 (six) hours as needed for moderate pain or headache.   Current Outpatient Medications (Hematological):    FOLIC ACID PO, Take 2,774 mcg by mouth daily.   rivaroxaban (XARELTO) 20 MG TABS tablet, Take 1 tablet (20 mg total) by mouth daily with supper.   vitamin B-12 (CYANOCOBALAMIN) 1000 MCG tablet, Take 1,000 mcg by mouth daily.   Current Outpatient Medications (Other):    DULoxetine (CYMBALTA) 60 MG capsule, Take 60 mg by mouth daily.   Glucosamine HCl 1500 MG TABS, Take 1,500 mg by mouth in the morning and at bedtime.   Meclizine HCl (BONINE PO), Take 1 tablet by mouth daily as needed (nausea).   Multiple Vitamin (MULTIVITAMIN WITH MINERALS) TABS tablet, Take 1 tablet by mouth daily. One-A-Day for Men   Multiple Vitamins-Minerals (PRESERVISION AREDS 2) CAPS, Take 1 capsule by mouth 2 (two) times daily.   pantoprazole (PROTONIX) 40 MG tablet, Take 1 tablet (40 mg total) by mouth 2 (two) times daily.   polycarbophil (FIBERCON) 625 MG tablet, Take 1,250 mg by mouth every evening.   Probiotic Product (PROBIOTIC PO), Take 1 capsule by mouth at bedtime.   Vitamin D, Ergocalciferol, (DRISDOL) 1.25 MG (50000 UNIT) CAPS capsule, Take 50,000 Units by mouth every 7 (seven) days. Tuesdays  Current Facility-Administered Medications (Other):    0.9 %  sodium chloride infusion  Surgical History:  He  has a past surgical history that includes benign stomach tumor removal (2000); Laparoscopic retropubic prostatectomy (02/2007); kidney stone removal; TEE without cardioversion (08/25/2011); Cardioversion (08/25/2011); TEE without cardioversion (11/09/2011); Atrial fibrillation ablation (12/06/11; 02/05/2013); TEE without cardioversion (N/A, 02/05/2013); Cardioversion (N/A, 03/05/2013); Esophagogastroduodenoscopy  (N/A, 04/09/2013); atrial fibrillation ablation (N/A, 12/06/2011); atrial fibrillation ablation (N/A, 02/05/2013); Cardiac catheterization (N/A, 11/24/2014); convergent afib abaltion [facility] 02/26/15 (02/26/2015); ORIF ankle fracture (Right, 10/2015); ORIF ankle fracture (Right, 11/03/2015); Wrist surgery (Right, 1977); Knee arthroscopy (Bilateral, 1979); Total thyroidectomy (2009); Carpal tunnel with cubital tunnel (Right, 02/25/2020); TEE without cardioversion (N/A, 03/25/2020); Cardioversion (N/A, 03/25/2020); Cardioversion (N/A, 06/02/2020); Upper gastrointestinal endoscopy; Esophagogastroduodenoscopy (egd) with propofol (N/A, 01/24/2022); Hemostasis clip placement (01/24/2022); and submucosal injection (01/24/2022).  Current Medications, Allergies, Past Medical History, Past Surgical History, Family History and Social History were reviewed in [employer] record.  Physical Exam: BP 132/80   Pulse 62   Ht '5\' 7"'$  (1.702 m)   Wt 222 lb (100.7 kg)   BMI 34.77 kg/m  General:   Pleasant, well developed male in no acute distress Heart : Regular rate and rhythm; no murmurs Pulm: Clear anteriorly; no wheezing Abdomen:  Soft, Obese AB, Active bowel sounds. No tenderness . Without guarding and Without rebound, No organomegaly appreciated. Rectal: Not evaluated Extremities:  without  edema. Neurologic:  Alert and  oriented x4;  No focal deficits.  Psych:  Cooperative. Normal mood and affect.   Uvaldo Bristle  Silverio Lay, PA-C 03/08/22

## 2022-03-08 ENCOUNTER — Other Ambulatory Visit: Payer: Self-pay | Admitting: Cardiovascular Disease

## 2022-03-08 ENCOUNTER — Ambulatory Visit (INDEPENDENT_AMBULATORY_CARE_PROVIDER_SITE_OTHER): Payer: Medicare Other | Admitting: Physician Assistant

## 2022-03-08 ENCOUNTER — Encounter: Payer: Self-pay | Admitting: Physician Assistant

## 2022-03-08 VITALS — BP 132/80 | HR 62 | Ht 67.0 in | Wt 222.0 lb

## 2022-03-08 DIAGNOSIS — D500 Iron deficiency anemia secondary to blood loss (chronic): Secondary | ICD-10-CM | POA: Diagnosis not present

## 2022-03-08 DIAGNOSIS — I4891 Unspecified atrial fibrillation: Secondary | ICD-10-CM | POA: Diagnosis not present

## 2022-03-08 DIAGNOSIS — K317 Polyp of stomach and duodenum: Secondary | ICD-10-CM | POA: Diagnosis not present

## 2022-03-08 DIAGNOSIS — Z860100 Personal history of colon polyps, unspecified: Secondary | ICD-10-CM

## 2022-03-08 NOTE — Patient Instructions (Addendum)
Please take your proton pump inhibitor medication, pantoprazole daily  Please take this medication 30 minutes to 1 hour before meals- this makes it more effective.  Avoid spicy and acidic foods Avoid fatty foods Limit your intake of coffee, tea, alcohol, and carbonated drinks Work to maintain a healthy weight Keep the head of the bed elevated at least 3 inches with blocks or a wedge pillow if you are having any nighttime symptoms Stay upright for 2 hours after eating Avoid meals and snacks three to four hours before bedtime  Follow up as needed.  Get the CBC checked in Dec with cardiology  Has recall colon 08/2023, we will send you a letter.

## 2022-03-08 NOTE — Telephone Encounter (Signed)
Rx request sent to pharmacy.  

## 2022-03-09 DIAGNOSIS — Z23 Encounter for immunization: Secondary | ICD-10-CM | POA: Diagnosis not present

## 2022-03-11 NOTE — Progress Notes (Signed)
Addendum: Reviewed and agree with assessment and management plan. Oddie Kuhlmann M, MD  

## 2022-03-14 ENCOUNTER — Ambulatory Visit (INDEPENDENT_AMBULATORY_CARE_PROVIDER_SITE_OTHER): Payer: Medicare Other | Admitting: Cardiovascular Disease

## 2022-03-14 ENCOUNTER — Encounter: Payer: Self-pay | Admitting: Internal Medicine

## 2022-03-14 ENCOUNTER — Encounter (HOSPITAL_BASED_OUTPATIENT_CLINIC_OR_DEPARTMENT_OTHER): Payer: Self-pay | Admitting: Cardiovascular Disease

## 2022-03-14 VITALS — BP 132/76 | HR 63 | Ht 67.0 in | Wt 221.7 lb

## 2022-03-14 DIAGNOSIS — Z5181 Encounter for therapeutic drug level monitoring: Secondary | ICD-10-CM | POA: Diagnosis not present

## 2022-03-14 DIAGNOSIS — E1169 Type 2 diabetes mellitus with other specified complication: Secondary | ICD-10-CM | POA: Diagnosis not present

## 2022-03-14 DIAGNOSIS — I4891 Unspecified atrial fibrillation: Secondary | ICD-10-CM

## 2022-03-14 DIAGNOSIS — E785 Hyperlipidemia, unspecified: Secondary | ICD-10-CM

## 2022-03-14 DIAGNOSIS — I5042 Chronic combined systolic (congestive) and diastolic (congestive) heart failure: Secondary | ICD-10-CM | POA: Diagnosis not present

## 2022-03-14 MED ORDER — APIXABAN 5 MG PO TABS: 5.0000 mg | ORAL_TABLET | Freq: Two times a day (BID) | ORAL | 1 refills | Status: DC

## 2022-03-14 NOTE — Patient Instructions (Signed)
Medication Instructions:  STOP XARELTO  START ELIQUIS 5 MG TWICE A DAY   *If you need a refill on your cardiac medications before your next appointment, please call your pharmacy*  Lab Work: FASTING LP/CMET/CBC SOON   If you have labs (blood work) drawn today and your tests are completely normal, you will receive your results only by: [place] (if you have MyChart) OR A paper copy in the mail If you have any lab test that is abnormal or we need to change your treatment, we will call you to review the results.  Testing/Procedures: NONE   Follow-Up: At [facility], you and your health needs are our priority.  As part of our continuing mission to provide you with exceptional heart care, we have created designated Provider Care Teams.  These Care Teams include your primary Cardiologist (physician) and Advanced Practice Providers (APPs -  Physician Assistants and Nurse Practitioners) who all work together to provide you with the care you need, when you need it.  We recommend signing up for the patient portal called "MyChart".  Sign up information is provided on this After Visit Summary.  MyChart is used to connect with patients for Virtual Visits (Telemedicine).  Patients are able to view lab/test results, encounter notes, upcoming appointments, etc.  Non-urgent messages can be sent to your provider as well.   To learn more about what you can do with MyChart, go to [email or web address].    Your next appointment:   6 month(s)  The format for your next appointment:   In Person  Provider:   Skeet Latch, MD

## 2022-03-14 NOTE — Assessment & Plan Note (Signed)
Currently maintaining sinus rhythm.  He is doing well but has struggled with GIB and hematuria.  We discussed Watchman.  He isn't interested in procedures at this time but will think about it.  For now we will switch from Xarelto to Eliquis 5 mg twice daily due to the favorable bleeding risk profile.  Continue carvedilol and dronedarone.  He will come back for CBC.

## 2022-03-14 NOTE — Progress Notes (Signed)
Cardiology Office Note  Date:  03/14/2022   ID:  [name] [name], Rack 16-Feb-1951, MRN [id number]  PCP:  [name] Lopes, MD  Cardiologist:   [name] Latch, MD  No chief complaint on file.    History of Present Illness: [name] [name] is a 71 y.o. male with atrial fibrillation s/p ablation x2 [facility]), chronic diastolic heart failure, hypertension, hyperlipidemia, DM, and prior prostate cancer who presents for follow up.  He had ablations at [facility] most recently in 2016.  That was reportedly complicated by cardiogenic shock requiring 4 days in ICU and intubation.  He last saw Dr. Irish [name] 03/2020 and had recurrent atrial flutter after a recent surgery.  He underwent TEE/DCCV on 03/2020.  He was started back on dronedarone, which he had previously tolerated.  Since then he followed up in the atrial fibrillation clinic.  He had rate controlled atrial fibrillation when last seen 05/26/20.  This was thoguht to be triggered by a UTI and he was successfully cardiovereted 2/22.  Since then he has been doing well and maintaining sinus rhythm.  He has lost 20 lb and no longer snores.   At his last visit he was struggling with urinary tract infections but was stable from a cardiac standpoint.  He follow-up with [name] Montana, NP 02/2022 after an admission for symptomatic anemia with upper GI bleed.  EGD revealed nonbleeding gastric ulcer which was treated with epi injection and 3 clips.  Xarelto was held for 5 days post procedure.  He called the office with cardia strips that showed rate controlled atrial fibrillation.  By the time he was seen in the office he was back in sinus rhythm.  Mr. [name] [name] has been doing well overall. He is accompanied by his wife. He GI and there was a discussion about possibly switching the Xarelto to Eliquis. He noted when he had his iron infusion, he had an episode of atrial fibrillation and felt like he was having a heart attack even though his vitals were normal at that  time. He also experienced fatigue at the time of the infusion. He noted having issues with the Xarelto.  His blood pressures at home have been well controlled.  He has been working to increase his exercise.  He had his COVID-19 vaccine four days ago and wonders if he can get his RSV vaccine yet.    Past Medical History:  Diagnosis Date   Arthritis    OA   Atrial flutter ([place])    a. s/p RFCA 8/13   Cancer ([place]) 2010   prostate   Chronic combined systolic and diastolic CHF (congestive heart failure) ([place])    a. LVEF previously 35% felt to be due tachycardia;  b. 02/2013 Echo: EF 50-55%, no rwma, mildly dil LA/RA, mild to mod MR. C 11/2014 echo - ef 60-65%, unable to determine DD   CKD (chronic kidney disease), stage III (HCC)    hx of a/c renal failure during episode of diverticulitis 9/13 in [place]   Diabetes mellitus ([place])    TYPE 2    Diverticulitis    Diverticulosis    Dyslipidemia    Dysrhythmia    Erectile dysfunction    Gastric ulcer    s/p prior surgery   GERD (gastroesophageal reflux disease)    Headache    History of colonic diverticulitis    History of prostate cancer    Hypertension    Hypothyroid    Kidney stones    Microcytic anemia  Obesities, morbid ([place])    Obstructive sleep apnea    noncompliant with CPAP   Osteoporosis    PAF (paroxysmal atrial fibrillation) ([place])    a. failed DCCV and multiple anti-arrhythmic drugs (multaq/flecainide);   b. s/p  PVI isolation with ablation of AFib 8/13; c. repeat PVI 01/2013;  d. 02/2013 repeat DCCV->Amio load/xarelto.   Peptic ulcer disease 1994   Renal calculi    Sleep apnea 2015   has not had sleep apnea since heart intervention   Thrombocytopenia ([place]) 1992   idopathic-treated with danazol   Tubular adenoma of colon     Past Surgical History:  Procedure Laterality Date   ATRIAL FIBRILLATION ABLATION  12/06/11; 02/05/2013   Afib and atrial flutter ablation by Dr Rayann Heman; repeat PVI by Dr Rayann Heman 02/05/2013    ATRIAL FIBRILLATION ABLATION N/A 12/06/2011   Procedure: ATRIAL FIBRILLATION ABLATION;  Surgeon: Thompson Grayer, MD;  Location: [facility] CATH LAB;  Service: Cardiovascular;  Laterality: N/A;   ATRIAL FIBRILLATION ABLATION N/A 02/05/2013   Procedure: ATRIAL FIBRILLATION ABLATION;  Surgeon: Coralyn Mark, MD;  Location: [place] CATH LAB;  Service: Cardiovascular;  Laterality: N/A;   benign stomach tumor removal  2000   CARDIOVERSION  08/25/2011   Procedure: CARDIOVERSION;  Surgeon: Jettie Booze, MD;  Location: [place];  Service: Cardiovascular;  Laterality: N/A;   CARDIOVERSION N/A 03/05/2013   Procedure: CARDIOVERSION;  Surgeon: Sinclair Grooms, MD;  Location: [place];  Service: Cardiovascular;  Laterality: N/A;  BEDSIDE    CARDIOVERSION N/A 03/25/2020   Procedure: CARDIOVERSION;  Surgeon: [name] Latch, MD;  Location: [place];  Service: Cardiovascular;  Laterality: N/A;   CARDIOVERSION N/A 06/02/2020   Procedure: CARDIOVERSION;  Surgeon: Pixie Casino, MD;  Location: [facility] ENDOSCOPY;  Service: Cardiovascular;  Laterality: N/A;   CARPAL [place] Right 02/25/2020   Procedure: RIGHT CARPAL TUNNEL RELEASE, CUBITAL TUNNEL RELEASE IN SITU, RIGHT WRIST PROXIMAL ROW CARPECTOMY AND PROXIMAL CAPITATE HEAD REPLACEMENT/HEMIARTHROPLASTY WITH POSTERIOR INTEROSSOUS NERVE NEURECTOMY AND REPAIR;  Surgeon: Roseanne Kaufman, MD;  Location: [place];  Service: Orthopedics;  Laterality: Right;  2.5 hrs Block with IV Sedation   convergent afib abaltion [facility] 02/26/15  02/26/2015   [facility] by Dr. Lehman Prom and Dr. Danise Mina   ELECTROPHYSIOLOGIC STUDY N/A 11/24/2014   Procedure: Cardioversion;  Surgeon: Will Meredith Leeds, MD;  Location: [place] CV LAB;  Service: Cardiovascular;  Laterality: N/A;   ESOPHAGOGASTRODUODENOSCOPY N/A 04/09/2013   Procedure: ESOPHAGOGASTRODUODENOSCOPY (EGD) with possible Balloon Dilation;  Surgeon: Garlan Fair, MD;  Location: [facility] ENDOSCOPY;  Service: Endoscopy;   Laterality: N/A;   ESOPHAGOGASTRODUODENOSCOPY (EGD) WITH PROPOFOL N/A 01/24/2022   Procedure: ESOPHAGOGASTRODUODENOSCOPY (EGD) WITH PROPOFOL;  Surgeon: Doran Stabler, MD;  Location: [facility] ENDOSCOPY;  Service: Gastroenterology;  Laterality: N/A;   HEMOSTASIS CLIP PLACEMENT  01/24/2022   Procedure: HEMOSTASIS CLIP PLACEMENT;  Surgeon: Doran Stabler, MD;  Location: [facility] ENDOSCOPY;  Service: Gastroenterology;;   kidney stone removal     multiple   KNEE ARTHROSCOPY Bilateral 1979   LAPAROSCOPIC RETROPUBIC PROSTATECTOMY  02/2007   hx prostate cancer   ORIF ANKLE FRACTURE Right 10/2015   ORIF ANKLE FRACTURE Right 11/03/2015   Procedure: ORIF RIGHT LISFRANK FOOT;  Surgeon: Newt Minion, MD;  Location: [place];  Service: Orthopedics;  Laterality: Right;   SUBMUCOSAL INJECTION  01/24/2022   Procedure: SUBMUCOSAL INJECTION;  Surgeon: Doran Stabler, MD;  Location: [facility] ENDOSCOPY;  Service: Gastroenterology;;   TEE WITHOUT CARDIOVERSION  08/25/2011  Procedure: TRANSESOPHAGEAL ECHOCARDIOGRAM (TEE);  Surgeon: Jettie Booze, MD;  Location: [place];  Service: Cardiovascular;  Laterality: N/A;   TEE WITHOUT CARDIOVERSION  11/09/2011   Procedure: TRANSESOPHAGEAL ECHOCARDIOGRAM (TEE);  Surgeon: Jettie Booze, MD;  Location: [place];  Service: Cardiovascular;  Laterality: N/A;  h/p in file drawer   TEE WITHOUT CARDIOVERSION N/A 02/05/2013   Procedure: TRANSESOPHAGEAL ECHOCARDIOGRAM (TEE);  Surgeon: Candee Furbish, MD;  Location: [facility] ENDOSCOPY;  Service: Cardiovascular;  Laterality: N/A;   TEE WITHOUT CARDIOVERSION N/A 03/25/2020   Procedure: TRANSESOPHAGEAL ECHOCARDIOGRAM (TEE);  Surgeon: [name] Latch, MD;  Location: [place];  Service: Cardiovascular;  Laterality: N/A;   TOTAL THYROIDECTOMY  2009   benign nodules removed   UPPER GASTROINTESTINAL ENDOSCOPY     WRIST SURGERY Right 1977   with placement of lunate prosthesis     Current Outpatient Medications  Medication  Sig Dispense Refill   acetaminophen (TYLENOL) 500 MG tablet Take 1,000 mg by mouth every 6 (six) hours as needed for moderate pain or headache.     apixaban (ELIQUIS) 5 MG TABS tablet Take 1 tablet (5 mg total) by mouth 2 (two) times daily. 180 tablet 1   carvedilol (COREG) 12.5 MG tablet TAKE 3 TABLETS BY MOUTH TWICE DAILY 540 tablet 0   DULoxetine (CYMBALTA) 60 MG capsule Take 60 mg by mouth daily.     FOLIC ACID PO Take 6,962 mcg by mouth daily.     Glucosamine HCl 1500 MG TABS Take 1,500 mg by mouth in the morning and at bedtime.     JARDIANCE 25 MG TABS tablet Take 25 mg by mouth daily.     levothyroxine (SYNTHROID) 175 MCG tablet Take 175 mcg by mouth daily.     losartan (COZAAR) 50 MG tablet Take 50 mg by mouth every evening.     Meclizine HCl (BONINE PO) Take 1 tablet by mouth daily as needed (nausea).     metFORMIN (GLUCOPHAGE-XR) 500 MG 24 hr tablet Take 1,000 mg by mouth 2 (two) times daily.     MULTAQ 400 MG tablet TAKE 1 TABLET TWICE DAILY  WITH MEALS (Patient taking differently: Take 400 mg by mouth 2 (two) times daily with a meal.) 180 tablet 1   Multiple Vitamin (MULTIVITAMIN WITH MINERALS) TABS tablet Take 1 tablet by mouth daily. One-A-Day for Men     Multiple Vitamins-Minerals (PRESERVISION AREDS 2) CAPS Take 1 capsule by mouth 2 (two) times daily.     omeprazole (PRILOSEC) 40 MG capsule Take 40 mg by mouth daily.     polycarbophil (FIBERCON) 625 MG tablet Take 1,250 mg by mouth every evening.     Probiotic Product (PROBIOTIC PO) Take 1 capsule by mouth at bedtime.     rosuvastatin (CRESTOR) 10 MG tablet Take 1 tablet by mouth once daily 90 tablet 1   TRULICITY 9.52 WU/1.3KG SOPN Inject 1.5 mg into the skin once a week. Tuesdays     vitamin B-12 (CYANOCOBALAMIN) 1000 MCG tablet Take 1,000 mcg by mouth daily.     Vitamin D, Ergocalciferol, (DRISDOL) 1.25 MG (50000 UNIT) CAPS capsule Take 50,000 Units by mouth every 7 (seven) days. Tuesdays     Current Facility-Administered  Medications  Medication Dose Route Frequency Provider Last Rate Last Admin   0.9 %  sodium chloride infusion  500 mL Intravenous Once Pyrtle, Lajuan Lines, MD        Allergies:   Thayer Jew hcl], Calcium channel blockers, Feraheme [ferumoxytol], Phenergan [promethazine hcl], Beta adrenergic blockers, Cardizem [  diltiazem], Norvasc [amlodipine], Aspirin, Nsaids, and Prednisone    Social History:  The patient  reports that he has never smoked. He has never used smokeless tobacco. He reports that he does not drink alcohol and does not use drugs.   Family History:  The patient's family history includes Atrial fibrillation in his brother; Colon polyps in his brother, sister, and sister; Diabetes in his brother; Emphysema in his mother; Lung cancer in his father and sister; Throat cancer in his sister.    ROS:  Please see the history of present illness.   Otherwise, review of systems are positive for none.   All other systems are reviewed and negative.    PHYSICAL EXAM: VS:  BP 132/76 (BP Location: Left Arm, Patient Position: Sitting, Cuff Size: Large)   Pulse 63   Ht '5\' 7"'$  (1.702 m)   Wt 221 lb 11.2 oz (100.6 kg)   BMI 34.72 kg/m  , BMI Body mass index is 34.72 kg/m. GENERAL:  Well appearing HEENT: Pupils equal round and reactive, fundi not visualized, oral mucosa unremarkable NECK:  No jugular venous distention, waveform within normal limits, carotid upstroke brisk and symmetric, no bruits, no thyromegaly LUNGS:  Clear to auscultation bilaterally HEART:  RRR.  PMI not displaced or sustained,S1 and S2 within normal limits, no S3, no S4, no clicks, no rubs, no murmurs ABD:  Flat, positive bowel sounds normal in frequency in pitch, no bruits, no rebound, no guarding, no midline pulsatile mass, no hepatomegaly, no splenomegaly EXT:  2 plus pulses throughout, no edema, no cyanosis no clubbing SKIN:  No rashes no nodules NEURO:  Cranial nerves II through XII grossly intact, motor grossly  intact throughout PSYCH:  Cognitively intact, oriented to person place and time  EKG:  EKG is personally reviewed. 03/14/22: EKG is personally reviewed. 09/08/2021: Sinus rhythm.  Rate 69 bpm. LVH.  TEE 03/25/20: 1. Left ventricular ejection fraction, by estimation, is 60 to 65%. The  left ventricle has normal function. The left ventricle has no regional  wall motion abnormalities.   2. Right ventricular systolic function is normal. The right ventricular  size is normal.   3. No left atrial/left atrial appendage thrombus was detected. The LAA  emptying velocity was 86 cm/s.   4. The mitral valve is normal in structure. Mild mitral valve  regurgitation. No evidence of mitral stenosis.   5. The aortic valve is tricuspid. Aortic valve regurgitation is not  visualized. No aortic stenosis is present.   6. Aortic dilatation noted. There is mild dilatation of the ascending  aorta, measuring 38 mm.   7. The inferior vena cava is normal in size with greater than 50%  respiratory variability, suggesting right atrial pressure of 3 mmHg.   Recent Labs: 01/23/2022: ALT 15 01/25/2022: Magnesium 2.0 02/21/2022: BUN 13; Creatinine, Ser 1.42; Hemoglobin 11.8; Platelets 188; Potassium 4.3; Sodium 144    Lipid Panel    Component Value Date/Time   CHOL 181 07/24/2013 0530   TRIG 99 07/24/2013 0530   HDL 52 07/24/2013 0530   CHOLHDL 3.5 07/24/2013 0530   VLDL 20 07/24/2013 0530   LDLCALC 109 (H) 07/24/2013 0530      Wt Readings from Last 3 Encounters:  03/14/22 221 lb 11.2 oz (100.6 kg)  03/08/22 222 lb (100.7 kg)  02/21/22 226 lb (102.5 kg)      ASSESSMENT AND PLAN:  Atrial fibrillation with RVR Currently maintaining sinus rhythm.  He is doing well but has struggled with GIB and  hematuria.  We discussed Watchman.  He isn't interested in procedures at this time but will think about it.  For now we will switch from Xarelto to Eliquis 5 mg twice daily due to the favorable bleeding risk  profile.  Continue carvedilol and dronedarone.  He will come back for CBC.  Chronic combined systolic and diastolic CHF (congestive heart failure) (HCC) LVEF was 35% and due to tachycardia.  It normalized with a rate and rhythm control.  He is euvolemic and doing well.  Continue carvedilol, Jardiance, and losartan.  Hyperlipidemia associated with type 2 diabetes mellitus ([place]) He will come back for fasting lipids and a CMP.  Continue rosuvastatin.   Current medicines are reviewed at length with the patient today.  The patient does not have concerns regarding medicines.  The following changes have been made: switch Xarelto to Eliquis  Labs/ tests ordered today include:   Orders Placed This Encounter  Procedures   CBC with Differential/Platelet   Lipid panel   Comprehensive metabolic panel    Disposition:   FU with Estes Lehner C. Oval Linsey, MD, [facility] in 6 months    I, [place] Oval Linsey, MD have reviewed all documentation for this visit.  The documentation of the exam, diagnosis, procedures, and orders on 03/14/2022 are all accurate and complete.   Signed, Francy Mcilvaine C. Oval Linsey, MD, [facility]  03/14/2022 4:07 PM    [place]

## 2022-03-14 NOTE — Assessment & Plan Note (Signed)
LVEF was 35% and due to tachycardia.  It normalized with a rate and rhythm control.  He is euvolemic and doing well.  Continue carvedilol, Jardiance, and losartan.

## 2022-03-14 NOTE — Progress Notes (Deleted)
Cardiology Office Note  Date:  03/14/2022   ID:  Jamaal, Bernasconi [birth year], MRN [id number]  PCP:  Donnajean Lopes, MD  Cardiologist:   Skeet Latch, MD   No chief complaint on file.    History of Present Illness: [name] [name] is a 71 y.o. male with atrial fibrillation s/p ablation x2 [facility]), chronic diastolic heart failure, hypertension, hyperlipidemia, DM, and prior prostate cancer who presents for follow up.  He had ablations at [facility] most recently in 2016.  That was reportedly complicated by cardiogenic shock requiring 4 days in ICU and intubation.  He last saw Dr. Irish Lack 03/2020 and had recurrent atrial flutter after a recent surgery.  He underwent TEE/DCCV on 03/2020.  He was started back on dronedarone, which he had previously tolerated.  Since then he followed up in the atrial fibrillation clinic.  He had rate controlled atrial fibrillation when last seen 05/26/20.  This was thoguht to be triggered by a UTI and he was successfully cardiovereted 2/22.  Since then he has been doing well and maintaining sinus rhythm.  He has lost 20 lb and no longer snores.   At his last visit he was struggling with urinary tract infections but was stable from a cardiac standpoint.  He follow-up with Laurann Montana, NP 02/2022 after an admission for symptomatic anemia with upper GI bleed.  EGD revealed nonbleeding gastric ulcer which was treated with epi injection and 3 clips.  Xarelto was held for 5 days post procedure.  He called the office with cardia strips that showed rate controlled atrial fibrillation.  By the time he was seen in the office he was back in sinus rhythm.   Past Medical History:  Diagnosis Date   Arthritis    OA   Atrial flutter ([place])    a. s/p RFCA 8/13   Cancer ([place]) 2010   prostate   Chronic combined systolic and diastolic CHF (congestive heart failure) ([place])    a. LVEF previously 35% felt to be due tachycardia;  b. 02/2013 Echo: EF 50-55%, no rwma, mildly dil  LA/RA, mild to mod MR. C 11/2014 echo - ef 60-65%, unable to determine DD   CKD (chronic kidney disease), stage III (HCC)    hx of a/c renal failure during episode of diverticulitis 9/13 in [place], [place]   Diabetes mellitus ([place])    TYPE 2    Diverticulitis    Diverticulosis    Dyslipidemia    Dysrhythmia    Erectile dysfunction    Gastric ulcer    s/p prior surgery   GERD (gastroesophageal reflux disease)    Headache    History of colonic diverticulitis    History of prostate cancer    Hypertension    Hypothyroid    Kidney stones    Microcytic anemia    Obesities, morbid ([place])    Obstructive sleep apnea    noncompliant with CPAP   Osteoporosis    PAF (paroxysmal atrial fibrillation) ([place])    a. failed DCCV and multiple anti-arrhythmic drugs (multaq/flecainide);   b. s/p  PVI isolation with ablation of AFib 8/13; c. repeat PVI 01/2013;  d. 02/2013 repeat DCCV->Amio load/xarelto.   Peptic ulcer disease 1994   Renal calculi    Sleep apnea 2015   has not had sleep apnea since heart intervention   Thrombocytopenia ([place]) 1992   idopathic-treated with danazol   Tubular adenoma of colon     Past Surgical History:  Procedure Laterality Date  ATRIAL FIBRILLATION ABLATION  12/06/11; 02/05/2013   Afib and atrial flutter ablation by Dr Rayann Heman; repeat PVI by Dr Rayann Heman 02/05/2013   ATRIAL FIBRILLATION ABLATION N/A 12/06/2011   Procedure: ATRIAL FIBRILLATION ABLATION;  Surgeon: Thompson Grayer, MD;  Location: [facility] CATH LAB;  Service: Cardiovascular;  Laterality: N/A;   ATRIAL FIBRILLATION ABLATION N/A 02/05/2013   Procedure: ATRIAL FIBRILLATION ABLATION;  Surgeon: Coralyn Mark, MD;  Location: [place] CATH LAB;  Service: Cardiovascular;  Laterality: N/A;   benign stomach tumor removal  2000   CARDIOVERSION  08/25/2011   Procedure: CARDIOVERSION;  Surgeon: Jettie Booze, MD;  Location: [place];  Service: Cardiovascular;  Laterality: N/A;   CARDIOVERSION N/A 03/05/2013   Procedure:  CARDIOVERSION;  Surgeon: Sinclair Grooms, MD;  Location: [place];  Service: Cardiovascular;  Laterality: N/A;  BEDSIDE    CARDIOVERSION N/A 03/25/2020   Procedure: CARDIOVERSION;  Surgeon: Skeet Latch, MD;  Location: [place];  Service: Cardiovascular;  Laterality: N/A;   CARDIOVERSION N/A 06/02/2020   Procedure: CARDIOVERSION;  Surgeon: Pixie Casino, MD;  Location: [facility] ENDOSCOPY;  Service: Cardiovascular;  Laterality: N/A;   CARPAL [place] Right 02/25/2020   Procedure: RIGHT CARPAL TUNNEL RELEASE, CUBITAL TUNNEL RELEASE IN SITU, RIGHT WRIST PROXIMAL ROW CARPECTOMY AND PROXIMAL CAPITATE HEAD REPLACEMENT/HEMIARTHROPLASTY WITH POSTERIOR INTEROSSOUS NERVE NEURECTOMY AND REPAIR;  Surgeon: Roseanne Kaufman, MD;  Location: [place];  Service: Orthopedics;  Laterality: Right;  2.5 hrs Block with IV Sedation   convergent afib abaltion [facility] 02/26/15  02/26/2015   [facility] by Dr. Lehman Prom and Dr. Danise Mina   ELECTROPHYSIOLOGIC STUDY N/A 11/24/2014   Procedure: Cardioversion;  Surgeon: Will Meredith Leeds, MD;  Location: [place] CV LAB;  Service: Cardiovascular;  Laterality: N/A;   ESOPHAGOGASTRODUODENOSCOPY N/A 04/09/2013   Procedure: ESOPHAGOGASTRODUODENOSCOPY (EGD) with possible Balloon Dilation;  Surgeon: Garlan Fair, MD;  Location: [facility] ENDOSCOPY;  Service: Endoscopy;  Laterality: N/A;   ESOPHAGOGASTRODUODENOSCOPY (EGD) WITH PROPOFOL N/A 01/24/2022   Procedure: ESOPHAGOGASTRODUODENOSCOPY (EGD) WITH PROPOFOL;  Surgeon: Doran Stabler, MD;  Location: [facility] ENDOSCOPY;  Service: Gastroenterology;  Laterality: N/A;   HEMOSTASIS CLIP PLACEMENT  01/24/2022   Procedure: HEMOSTASIS CLIP PLACEMENT;  Surgeon: Doran Stabler, MD;  Location: [facility] ENDOSCOPY;  Service: Gastroenterology;;   kidney stone removal     multiple   KNEE ARTHROSCOPY Bilateral 1979   LAPAROSCOPIC RETROPUBIC PROSTATECTOMY  02/2007   hx prostate cancer   ORIF ANKLE FRACTURE Right 10/2015   ORIF ANKLE FRACTURE  Right 11/03/2015   Procedure: ORIF RIGHT LISFRANK FOOT;  Surgeon: Newt Minion, MD;  Location: [place];  Service: Orthopedics;  Laterality: Right;   SUBMUCOSAL INJECTION  01/24/2022   Procedure: SUBMUCOSAL INJECTION;  Surgeon: Doran Stabler, MD;  Location: [facility] ENDOSCOPY;  Service: Gastroenterology;;   TEE WITHOUT CARDIOVERSION  08/25/2011   Procedure: TRANSESOPHAGEAL ECHOCARDIOGRAM (TEE);  Surgeon: Jettie Booze, MD;  Location: [place];  Service: Cardiovascular;  Laterality: N/A;   TEE WITHOUT CARDIOVERSION  11/09/2011   Procedure: TRANSESOPHAGEAL ECHOCARDIOGRAM (TEE);  Surgeon: Jettie Booze, MD;  Location: [place];  Service: Cardiovascular;  Laterality: N/A;  h/p in file drawer   TEE WITHOUT CARDIOVERSION N/A 02/05/2013   Procedure: TRANSESOPHAGEAL ECHOCARDIOGRAM (TEE);  Surgeon: Candee Furbish, MD;  Location: [facility] ENDOSCOPY;  Service: Cardiovascular;  Laterality: N/A;   TEE WITHOUT CARDIOVERSION N/A 03/25/2020   Procedure: TRANSESOPHAGEAL ECHOCARDIOGRAM (TEE);  Surgeon: Skeet Latch, MD;  Location: [place];  Service: Cardiovascular;  Laterality: N/A;  TOTAL THYROIDECTOMY  2009   benign nodules removed   UPPER GASTROINTESTINAL ENDOSCOPY     WRIST SURGERY Right 1977   with placement of lunate prosthesis     Current Outpatient Medications  Medication Sig Dispense Refill   acetaminophen (TYLENOL) 500 MG tablet Take 1,000 mg by mouth every 6 (six) hours as needed for moderate pain or headache.     carvedilol (COREG) 12.5 MG tablet TAKE 3 TABLETS BY MOUTH TWICE DAILY 540 tablet 0   DULoxetine (CYMBALTA) 60 MG capsule Take 60 mg by mouth daily.     FOLIC ACID PO Take 0,354 mcg by mouth daily.     Glucosamine HCl 1500 MG TABS Take 1,500 mg by mouth in the morning and at bedtime.     JARDIANCE 25 MG TABS tablet Take 25 mg by mouth daily.     levothyroxine (SYNTHROID) 175 MCG tablet Take 175 mcg by mouth daily.     losartan (COZAAR) 50 MG tablet Take 50 mg by  mouth every evening.     Meclizine HCl (BONINE PO) Take 1 tablet by mouth daily as needed (nausea).     metFORMIN (GLUCOPHAGE-XR) 500 MG 24 hr tablet Take 1,000 mg by mouth 2 (two) times daily.     MULTAQ 400 MG tablet TAKE 1 TABLET TWICE DAILY  WITH MEALS (Patient taking differently: Take 400 mg by mouth 2 (two) times daily with a meal.) 180 tablet 1   Multiple Vitamin (MULTIVITAMIN WITH MINERALS) TABS tablet Take 1 tablet by mouth daily. One-A-Day for Men     Multiple Vitamins-Minerals (PRESERVISION AREDS 2) CAPS Take 1 capsule by mouth 2 (two) times daily.     pantoprazole (PROTONIX) 40 MG tablet Take 1 tablet (40 mg total) by mouth 2 (two) times daily. 60 tablet 1   polycarbophil (FIBERCON) 625 MG tablet Take 1,250 mg by mouth every evening.     Probiotic Product (PROBIOTIC PO) Take 1 capsule by mouth at bedtime.     rivaroxaban (XARELTO) 20 MG TABS tablet Take 1 tablet (20 mg total) by mouth daily with supper. 90 tablet 3   rosuvastatin (CRESTOR) 10 MG tablet Take 1 tablet by mouth once daily 90 tablet 1   TRULICITY 6.56 CL/2.7NT SOPN Inject 1.5 mg into the skin once a week. Tuesdays     vitamin B-12 (CYANOCOBALAMIN) 1000 MCG tablet Take 1,000 mcg by mouth daily.     Vitamin D, Ergocalciferol, (DRISDOL) 1.25 MG (50000 UNIT) CAPS capsule Take 50,000 Units by mouth every 7 (seven) days. Tuesdays     Current Facility-Administered Medications  Medication Dose Route Frequency Provider Last Rate Last Admin   0.9 %  sodium chloride infusion  500 mL Intravenous Once Pyrtle, Lajuan Lines, MD        Allergies:   [place] [facility] hcl], Calcium channel blockers, Feraheme [ferumoxytol], Phenergan [promethazine hcl], Beta adrenergic blockers, Cardizem [diltiazem], Norvasc [amlodipine], Aspirin, Nsaids, and Prednisone    Social History:  The patient  reports that he has never smoked. He has never used smokeless tobacco. He reports that he does not drink alcohol and does not use drugs.   Family History:   The patient's family history includes Atrial fibrillation in his brother; Colon polyps in his brother, sister, and sister; Diabetes in his brother; Emphysema in his mother; Lung cancer in his father and sister; Throat cancer in his sister.    ROS:  Please see the history of present illness.   Otherwise, review of systems are positive for none.  All other systems are reviewed and negative.    PHYSICAL EXAM: VS:  There were no vitals taken for this visit. , BMI There is no height or weight on file to calculate BMI. GENERAL:  Well appearing HEENT: Pupils equal round and reactive, fundi not visualized, oral mucosa unremarkable NECK:  No jugular venous distention, waveform within normal limits, carotid upstroke brisk and symmetric, no bruits, no thyromegaly LUNGS:  Clear to auscultation bilaterally HEART:  RRR.  PMI not displaced or sustained,S1 and S2 within normal limits, no S3, no S4, no clicks, no rubs, no murmurs ABD:  Flat, positive bowel sounds normal in frequency in pitch, no bruits, no rebound, no guarding, no midline pulsatile mass, no hepatomegaly, no splenomegaly EXT:  2 plus pulses throughout, no edema, no cyanosis no clubbing SKIN:  No rashes no nodules NEURO:  Cranial nerves II through XII grossly intact, motor grossly intact throughout PSYCH:  Cognitively intact, oriented to person place and time  EKG:  EKG is ordered today. 09/08/2021: Sinus rhythm.  Rate 69 bpm.  LVH.  TEE 03/25/20: 1. Left ventricular ejection fraction, by estimation, is 60 to 65%. The  left ventricle has normal function. The left ventricle has no regional  wall motion abnormalities.   2. Right ventricular systolic function is normal. The right ventricular  size is normal.   3. No left atrial/left atrial appendage thrombus was detected. The LAA  emptying velocity was 86 cm/s.   4. The mitral valve is normal in structure. Mild mitral valve  regurgitation. No evidence of mitral stenosis.   5. The aortic  valve is tricuspid. Aortic valve regurgitation is not  visualized. No aortic stenosis is present.   6. Aortic dilatation noted. There is mild dilatation of the ascending  aorta, measuring 38 mm.   7. The inferior vena cava is normal in size with greater than 50%  respiratory variability, suggesting right atrial pressure of 3 mmHg.  Recent Labs: 01/23/2022: ALT 15 01/25/2022: Magnesium 2.0 02/21/2022: BUN 13; Creatinine, Ser 1.42; Hemoglobin 11.8; Platelets 188; Potassium 4.3; Sodium 144    Lipid Panel    Component Value Date/Time   CHOL 181 07/24/2013 0530   TRIG 99 07/24/2013 0530   HDL 52 07/24/2013 0530   CHOLHDL 3.5 07/24/2013 0530   VLDL 20 07/24/2013 0530   LDLCALC 109 (H) 07/24/2013 0530      Wt Readings from Last 3 Encounters:  03/08/22 222 lb (100.7 kg)  02/21/22 226 lb (102.5 kg)  02/08/22 221 lb 12.8 oz (100.6 kg)      ASSESSMENT AND PLAN:  #Persistent atrial fibirllation:  # Atrial flutter: Mr. Kushner is maintaining sinus rhythm after cardioversion 05/2020.  Continue carvedilol, dronedarone, and Xarelto.  Xarelto was renally dosed due to AKI.  We will repeat a BMP in 2 weeks to see if he needs to remain on the 50 mg dose.  # Hypertension:  Blood pressure well have been controlled on carvedilol and losartan.  If he has another hypotensive episode in the setting of a UTI recommended that he hold his losartan.  # Hyperlipidemia:  Continue rosuvastatin.  Dr. Philip Aspen checks his lipids.  # TIA:  Continue rosuvastatin and Xarelto.  Current medicines are reviewed at length with the patient today.  The patient does not have concerns regarding medicines.  The following changes have been made:  no change  Labs/ tests ordered today include:   No orders of the defined types were placed in this encounter.    Disposition:  FU with Serina Nichter C. Oval Linsey, MD, [facility] in 6 months     Signed, Brizeida Mcmurry C. Oval Linsey, MD, [facility]  03/14/2022 8:05 AM    [place]

## 2022-03-14 NOTE — Assessment & Plan Note (Signed)
He will come back for fasting lipids and a CMP.  Continue rosuvastatin.

## 2022-03-15 ENCOUNTER — Telehealth (HOSPITAL_BASED_OUTPATIENT_CLINIC_OR_DEPARTMENT_OTHER): Payer: Self-pay

## 2022-03-15 DIAGNOSIS — D2262 Melanocytic nevi of left upper limb, including shoulder: Secondary | ICD-10-CM | POA: Diagnosis not present

## 2022-03-15 DIAGNOSIS — Z85820 Personal history of malignant melanoma of skin: Secondary | ICD-10-CM | POA: Diagnosis not present

## 2022-03-15 DIAGNOSIS — D224 Melanocytic nevi of scalp and neck: Secondary | ICD-10-CM | POA: Diagnosis not present

## 2022-03-15 DIAGNOSIS — D1801 Hemangioma of skin and subcutaneous tissue: Secondary | ICD-10-CM | POA: Diagnosis not present

## 2022-03-15 DIAGNOSIS — Z85828 Personal history of other malignant neoplasm of skin: Secondary | ICD-10-CM | POA: Diagnosis not present

## 2022-03-15 DIAGNOSIS — D225 Melanocytic nevi of trunk: Secondary | ICD-10-CM | POA: Diagnosis not present

## 2022-03-15 DIAGNOSIS — L821 Other seborrheic keratosis: Secondary | ICD-10-CM | POA: Diagnosis not present

## 2022-03-15 DIAGNOSIS — L570 Actinic keratosis: Secondary | ICD-10-CM | POA: Diagnosis not present

## 2022-03-15 MED ORDER — CARVEDILOL 12.5 MG PO TABS: 37.5000 mg | ORAL_TABLET | Freq: Two times a day (BID) | ORAL | 0 refills | Status: DC

## 2022-03-15 MED ORDER — ROSUVASTATIN CALCIUM 10 MG PO TABS: 10.0000 mg | ORAL_TABLET | Freq: Every day | ORAL | 1 refills | Status: AC

## 2022-03-15 NOTE — Telephone Encounter (Signed)
Received fax from [facility] requesting refills for Carvedilol and Rosuvastatin. Rx requests sent to pharmacy.

## 2022-03-18 DIAGNOSIS — E785 Hyperlipidemia, unspecified: Secondary | ICD-10-CM | POA: Diagnosis not present

## 2022-03-18 DIAGNOSIS — Z5181 Encounter for therapeutic drug level monitoring: Secondary | ICD-10-CM | POA: Diagnosis not present

## 2022-03-18 DIAGNOSIS — I4891 Unspecified atrial fibrillation: Secondary | ICD-10-CM | POA: Diagnosis not present

## 2022-03-18 DIAGNOSIS — E1169 Type 2 diabetes mellitus with other specified complication: Secondary | ICD-10-CM | POA: Diagnosis not present

## 2022-03-19 LAB — LIPID PANEL
Chol/HDL Ratio: 2.7 ratio (ref 0.0–5.0)
Cholesterol, Total: 115 mg/dL (ref 100–199)
HDL: 43 mg/dL (ref >39)
LDL Chol Calc (NIH): 52 mg/dL (ref 0–99)
Triglycerides: 105 mg/dL (ref 0–149)
VLDL Cholesterol Cal: 20 mg/dL (ref 5–40)

## 2022-03-19 LAB — CBC WITH DIFFERENTIAL/PLATELET
Basophils Absolute: 0 10*3/uL (ref 0.0–0.2)
Basos: 1 %
EOS (ABSOLUTE): 0.2 10*3/uL (ref 0.0–0.4)
Eos: 5 %
Hematocrit: 39.5 % (ref 37.5–51.0)
Hemoglobin: 12.2 g/dL — ABNORMAL LOW (ref 13.0–17.7)
Immature Grans (Abs): 0 10*3/uL (ref 0.0–0.1)
Immature Granulocytes: 0 %
Lymphocytes Absolute: 1.2 10*3/uL (ref 0.7–3.1)
Lymphs: 25 %
MCH: 27 pg (ref 26.6–33.0)
MCHC: 30.9 g/dL — ABNORMAL LOW (ref 31.5–35.7)
MCV: 87 fL (ref 79–97)
Monocytes Absolute: 0.5 10*3/uL (ref 0.1–0.9)
Monocytes: 10 %
Neutrophils Absolute: 2.7 10*3/uL (ref 1.4–7.0)
Neutrophils: 59 %
Platelets: 206 10*3/uL (ref 150–450)
RBC: 4.52 x10E6/uL (ref 4.14–5.80)
RDW: 17.8 % — ABNORMAL HIGH (ref 11.6–15.4)
WBC: 4.6 10*3/uL (ref 3.4–10.8)

## 2022-03-19 LAB — COMPREHENSIVE METABOLIC PANEL
ALT: 18 IU/L (ref 0–44)
AST: 19 IU/L (ref 0–40)
Albumin/Globulin Ratio: 2 (ref 1.2–2.2)
Albumin: 4.1 g/dL (ref 3.8–4.8)
Alkaline Phosphatase: 91 IU/L (ref 44–121)
BUN/Creatinine Ratio: 13 (ref 10–24)
BUN: 17 mg/dL (ref 8–27)
Bilirubin Total: 0.4 mg/dL (ref 0.0–1.2)
CO2: 18 mmol/L — ABNORMAL LOW (ref 20–29)
Calcium: 9 mg/dL (ref 8.6–10.2)
Chloride: 107 mmol/L — ABNORMAL HIGH (ref 96–106)
Creatinine, Ser: 1.35 mg/dL — ABNORMAL HIGH (ref 0.76–1.27)
Globulin, Total: 2.1 g/dL (ref 1.5–4.5)
Glucose: 148 mg/dL — ABNORMAL HIGH (ref 70–99)
Potassium: 3.8 mmol/L (ref 3.5–5.2)
Sodium: 141 mmol/L (ref 134–144)
Total Protein: 6.2 g/dL (ref 6.0–8.5)
eGFR: 56 mL/min/{1.73_m2} — ABNORMAL LOW (ref >59)

## 2022-03-23 ENCOUNTER — Other Ambulatory Visit: Payer: Self-pay

## 2022-03-23 DIAGNOSIS — D500 Iron deficiency anemia secondary to blood loss (chronic): Secondary | ICD-10-CM

## 2022-03-23 NOTE — Telephone Encounter (Signed)
Hemoglobin is improving, lets check RBC panel plus ferritin and CBC in 4 more weeks

## 2022-03-24 ENCOUNTER — Telehealth (HOSPITAL_BASED_OUTPATIENT_CLINIC_OR_DEPARTMENT_OTHER): Payer: Self-pay | Admitting: *Deleted

## 2022-03-24 MED ORDER — MULTAQ 400 MG PO TABS: 400.0000 mg | ORAL_TABLET | Freq: Two times a day (BID) | ORAL | 3 refills | Status: DC

## 2022-03-24 NOTE — Telephone Encounter (Signed)
Received patient assistance paper for Multaq Will have Dr Oval Linsey sign when she returns to office and fax

## 2022-03-28 DIAGNOSIS — N281 Cyst of kidney, acquired: Secondary | ICD-10-CM | POA: Diagnosis not present

## 2022-03-28 DIAGNOSIS — N200 Calculus of kidney: Secondary | ICD-10-CM | POA: Diagnosis not present

## 2022-04-01 NOTE — Telephone Encounter (Signed)
Faxed and confirmation received.

## 2022-04-07 DIAGNOSIS — R058 Other specified cough: Secondary | ICD-10-CM | POA: Diagnosis not present

## 2022-04-07 DIAGNOSIS — R0981 Nasal congestion: Secondary | ICD-10-CM | POA: Diagnosis not present

## 2022-04-07 DIAGNOSIS — J029 Acute pharyngitis, unspecified: Secondary | ICD-10-CM | POA: Diagnosis not present

## 2022-04-07 DIAGNOSIS — J069 Acute upper respiratory infection, unspecified: Secondary | ICD-10-CM | POA: Diagnosis not present

## 2022-04-07 DIAGNOSIS — Z1152 Encounter for screening for COVID-19: Secondary | ICD-10-CM | POA: Diagnosis not present

## 2022-04-19 ENCOUNTER — Encounter (HOSPITAL_BASED_OUTPATIENT_CLINIC_OR_DEPARTMENT_OTHER): Payer: Self-pay | Admitting: Cardiovascular Disease

## 2022-04-19 MED ORDER — APIXABAN 5 MG PO TABS: 5.0000 mg | ORAL_TABLET | Freq: Two times a day (BID) | ORAL | 1 refills | Status: DC

## 2022-04-21 ENCOUNTER — Telehealth: Payer: Self-pay

## 2022-04-21 NOTE — Telephone Encounter (Signed)
-----   Message from Algernon Huxley, RN sent at 03/23/2022  3:40 PM EST ----- Regarding: Labs Pt needs labs in 4 weeks. Orders in epic.

## 2022-04-21 NOTE — Telephone Encounter (Signed)
MyChart message sent with lab reminder. 

## 2022-04-22 NOTE — Telephone Encounter (Signed)
Patient has reviewed MyChart message. Last read by [name] at  6:54 PM on 04/21/2022.

## 2022-04-25 ENCOUNTER — Encounter (HOSPITAL_BASED_OUTPATIENT_CLINIC_OR_DEPARTMENT_OTHER): Payer: Self-pay

## 2022-04-29 ENCOUNTER — Encounter (HOSPITAL_BASED_OUTPATIENT_CLINIC_OR_DEPARTMENT_OTHER): Payer: Self-pay

## 2022-05-06 ENCOUNTER — Other Ambulatory Visit (INDEPENDENT_AMBULATORY_CARE_PROVIDER_SITE_OTHER): Payer: Medicare Other

## 2022-05-06 DIAGNOSIS — D500 Iron deficiency anemia secondary to blood loss (chronic): Secondary | ICD-10-CM

## 2022-05-06 LAB — CBC WITH DIFFERENTIAL/PLATELET
Basophils Absolute: 0.1 10*3/uL (ref 0.0–0.1)
Basophils Relative: 0.8 % (ref 0.0–3.0)
Eosinophils Absolute: 0.1 10*3/uL (ref 0.0–0.7)
Eosinophils Relative: 1.3 % (ref 0.0–5.0)
HCT: 43.2 % (ref 39.0–52.0)
Hemoglobin: 14.3 g/dL (ref 13.0–17.0)
Lymphocytes Relative: 19.7 % (ref 12.0–46.0)
Lymphs Abs: 1.4 10*3/uL (ref 0.7–4.0)
MCHC: 33.2 g/dL (ref 30.0–36.0)
MCV: 82.3 fl (ref 78.0–100.0)
Monocytes Absolute: 0.6 10*3/uL (ref 0.1–1.0)
Monocytes Relative: 8.6 % (ref 3.0–12.0)
Neutro Abs: 4.9 10*3/uL (ref 1.4–7.7)
Neutrophils Relative %: 69.6 % (ref 43.0–77.0)
Platelets: 193 10*3/uL (ref 150.0–400.0)
RBC: 5.24 Mil/uL (ref 4.22–5.81)
RDW: 17.9 % — ABNORMAL HIGH (ref 11.5–15.5)
WBC: 7 10*3/uL (ref 4.0–10.5)

## 2022-05-06 LAB — IBC + FERRITIN
Ferritin: 15.6 ng/mL — ABNORMAL LOW (ref 22.0–322.0)
Iron: 85 ug/dL (ref 42–165)
Saturation Ratios: 20.9 % (ref 20.0–50.0)
TIBC: 407.4 ug/dL (ref 250.0–450.0)
Transferrin: 291 mg/dL (ref 212.0–360.0)

## 2022-05-12 NOTE — Telephone Encounter (Signed)
Approved through 04/11/2023. 

## 2022-05-19 ENCOUNTER — Encounter (HOSPITAL_COMMUNITY): Payer: Self-pay | Admitting: *Deleted

## 2022-05-19 ENCOUNTER — Other Ambulatory Visit (HOSPITAL_COMMUNITY): Payer: Self-pay

## 2022-05-19 ENCOUNTER — Encounter: Payer: Self-pay | Admitting: Internal Medicine

## 2022-05-19 MED ORDER — TRULICITY 1.5 MG/0.5ML ~~LOC~~ SOAJ
1.5000 mg | SUBCUTANEOUS | 11 refills | Status: AC
  Filled 2022-05-19 – 2022-05-23 (×3): qty 2, 28d supply, fill #0
  Filled 2022-06-15: qty 2, 28d supply, fill #1

## 2022-05-20 ENCOUNTER — Other Ambulatory Visit (HOSPITAL_COMMUNITY): Payer: Self-pay

## 2022-05-23 ENCOUNTER — Other Ambulatory Visit (HOSPITAL_COMMUNITY): Payer: Self-pay

## 2022-05-25 ENCOUNTER — Other Ambulatory Visit (HOSPITAL_BASED_OUTPATIENT_CLINIC_OR_DEPARTMENT_OTHER): Payer: Self-pay | Admitting: Cardiovascular Disease

## 2022-05-25 MED ORDER — CARVEDILOL 12.5 MG PO TABS: 37.5000 mg | ORAL_TABLET | Freq: Two times a day (BID) | ORAL | 2 refills | Status: DC

## 2022-05-25 NOTE — Telephone Encounter (Signed)
Rx(s) sent to pharmacy electronically.  

## 2022-05-26 MED ORDER — LOSARTAN POTASSIUM 50 MG PO TABS: 50.0000 mg | ORAL_TABLET | Freq: Every evening | ORAL | 3 refills | Status: DC

## 2022-06-02 DIAGNOSIS — M25562 Pain in left knee: Secondary | ICD-10-CM | POA: Diagnosis not present

## 2022-06-02 DIAGNOSIS — I739 Peripheral vascular disease, unspecified: Secondary | ICD-10-CM | POA: Diagnosis not present

## 2022-06-02 DIAGNOSIS — N1831 Chronic kidney disease, stage 3a: Secondary | ICD-10-CM | POA: Diagnosis not present

## 2022-06-02 DIAGNOSIS — I700 Atherosclerosis of aorta: Secondary | ICD-10-CM | POA: Diagnosis not present

## 2022-06-02 DIAGNOSIS — E1121 Type 2 diabetes mellitus with diabetic nephropathy: Secondary | ICD-10-CM | POA: Diagnosis not present

## 2022-06-02 DIAGNOSIS — I129 Hypertensive chronic kidney disease with stage 1 through stage 4 chronic kidney disease, or unspecified chronic kidney disease: Secondary | ICD-10-CM | POA: Diagnosis not present

## 2022-06-02 DIAGNOSIS — I480 Paroxysmal atrial fibrillation: Secondary | ICD-10-CM | POA: Diagnosis not present

## 2022-06-02 DIAGNOSIS — G4733 Obstructive sleep apnea (adult) (pediatric): Secondary | ICD-10-CM | POA: Diagnosis not present

## 2022-06-15 DIAGNOSIS — M13831 Other specified arthritis, right wrist: Secondary | ICD-10-CM | POA: Diagnosis not present

## 2022-06-15 DIAGNOSIS — M13841 Other specified arthritis, right hand: Secondary | ICD-10-CM | POA: Diagnosis not present

## 2022-06-15 DIAGNOSIS — Z4789 Encounter for other orthopedic aftercare: Secondary | ICD-10-CM | POA: Diagnosis not present

## 2022-07-07 ENCOUNTER — Other Ambulatory Visit: Payer: Self-pay

## 2022-07-07 DIAGNOSIS — D500 Iron deficiency anemia secondary to blood loss (chronic): Secondary | ICD-10-CM

## 2022-07-13 ENCOUNTER — Other Ambulatory Visit (HOSPITAL_COMMUNITY): Payer: Self-pay

## 2022-07-14 ENCOUNTER — Encounter (HOSPITAL_BASED_OUTPATIENT_CLINIC_OR_DEPARTMENT_OTHER): Payer: Self-pay | Admitting: *Deleted

## 2022-07-21 DIAGNOSIS — R350 Frequency of micturition: Secondary | ICD-10-CM | POA: Diagnosis not present

## 2022-08-06 DIAGNOSIS — Z23 Encounter for immunization: Secondary | ICD-10-CM | POA: Diagnosis not present

## 2022-09-09 ENCOUNTER — Other Ambulatory Visit (INDEPENDENT_AMBULATORY_CARE_PROVIDER_SITE_OTHER): Payer: Medicare Other

## 2022-09-09 DIAGNOSIS — D500 Iron deficiency anemia secondary to blood loss (chronic): Secondary | ICD-10-CM | POA: Diagnosis not present

## 2022-09-09 LAB — IBC + FERRITIN
Ferritin: 33.4 ng/mL (ref 22.0–322.0)
Iron: 57 ug/dL (ref 42–165)
Saturation Ratios: 17.5 % — ABNORMAL LOW (ref 20.0–50.0)
TIBC: 324.8 ug/dL (ref 250.0–450.0)
Transferrin: 232 mg/dL (ref 212.0–360.0)

## 2022-09-09 LAB — CBC WITH DIFFERENTIAL/PLATELET
Basophils Absolute: 0.1 10*3/uL (ref 0.0–0.1)
Basophils Relative: 1 % (ref 0.0–3.0)
Eosinophils Absolute: 0.2 10*3/uL (ref 0.0–0.7)
Eosinophils Relative: 2.6 % (ref 0.0–5.0)
HCT: 42.6 % (ref 39.0–52.0)
Hemoglobin: 14 g/dL (ref 13.0–17.0)
Lymphocytes Relative: 21 % (ref 12.0–46.0)
Lymphs Abs: 1.4 10*3/uL (ref 0.7–4.0)
MCHC: 32.9 g/dL (ref 30.0–36.0)
MCV: 92.2 fl (ref 78.0–100.0)
Monocytes Absolute: 0.7 10*3/uL (ref 0.1–1.0)
Monocytes Relative: 10.4 % (ref 3.0–12.0)
Neutro Abs: 4.3 10*3/uL (ref 1.4–7.7)
Neutrophils Relative %: 65 % (ref 43.0–77.0)
Platelets: 176 10*3/uL (ref 150.0–400.0)
RBC: 4.62 Mil/uL (ref 4.22–5.81)
RDW: 16.4 % — ABNORMAL HIGH (ref 11.5–15.5)
WBC: 6.6 10*3/uL (ref 4.0–10.5)

## 2022-09-12 ENCOUNTER — Other Ambulatory Visit: Payer: Self-pay | Admitting: Internal Medicine

## 2022-09-12 DIAGNOSIS — D500 Iron deficiency anemia secondary to blood loss (chronic): Secondary | ICD-10-CM

## 2022-09-20 ENCOUNTER — Encounter (HOSPITAL_BASED_OUTPATIENT_CLINIC_OR_DEPARTMENT_OTHER): Payer: Self-pay | Admitting: Cardiovascular Disease

## 2022-09-20 ENCOUNTER — Telehealth (HOSPITAL_BASED_OUTPATIENT_CLINIC_OR_DEPARTMENT_OTHER): Payer: Self-pay | Admitting: Cardiovascular Disease

## 2022-09-20 NOTE — Telephone Encounter (Signed)
Left message to call back  Medications at front for pick up

## 2022-09-20 NOTE — Telephone Encounter (Signed)
New Message:    Wife is calling t o see if patient's Mul;taq is here?

## 2022-09-22 NOTE — Telephone Encounter (Signed)
Tried calling wife again, directly to voicemail. Sent mychart message here for pickup

## 2022-09-23 DIAGNOSIS — R399 Unspecified symptoms and signs involving the genitourinary system: Secondary | ICD-10-CM | POA: Diagnosis not present

## 2022-10-03 ENCOUNTER — Telehealth (HOSPITAL_BASED_OUTPATIENT_CLINIC_OR_DEPARTMENT_OTHER): Payer: Self-pay | Admitting: *Deleted

## 2022-10-03 NOTE — Telephone Encounter (Signed)
Patient has appointment this week, will give to him at visit

## 2022-10-03 NOTE — Telephone Encounter (Addendum)
Received Multaq 400 mg via patient assistance  Notification sent via mychart

## 2022-10-05 ENCOUNTER — Ambulatory Visit (INDEPENDENT_AMBULATORY_CARE_PROVIDER_SITE_OTHER): Payer: Medicare Other | Admitting: Cardiovascular Disease

## 2022-10-05 ENCOUNTER — Encounter (HOSPITAL_BASED_OUTPATIENT_CLINIC_OR_DEPARTMENT_OTHER): Payer: Self-pay | Admitting: Cardiovascular Disease

## 2022-10-05 VITALS — BP 130/81 | HR 63 | Ht 67.0 in | Wt 214.5 lb

## 2022-10-05 DIAGNOSIS — I5032 Chronic diastolic (congestive) heart failure: Secondary | ICD-10-CM

## 2022-10-05 DIAGNOSIS — E1159 Type 2 diabetes mellitus with other circulatory complications: Secondary | ICD-10-CM | POA: Diagnosis not present

## 2022-10-05 DIAGNOSIS — I152 Hypertension secondary to endocrine disorders: Secondary | ICD-10-CM

## 2022-10-05 DIAGNOSIS — G459 Transient cerebral ischemic attack, unspecified: Secondary | ICD-10-CM

## 2022-10-05 DIAGNOSIS — G4733 Obstructive sleep apnea (adult) (pediatric): Secondary | ICD-10-CM | POA: Diagnosis not present

## 2022-10-05 DIAGNOSIS — Z7984 Long term (current) use of oral hypoglycemic drugs: Secondary | ICD-10-CM

## 2022-10-05 DIAGNOSIS — I480 Paroxysmal atrial fibrillation: Secondary | ICD-10-CM | POA: Diagnosis not present

## 2022-10-05 DIAGNOSIS — I951 Orthostatic hypotension: Secondary | ICD-10-CM

## 2022-10-05 NOTE — Patient Instructions (Signed)
Medication Instructions:  Your physician recommends that you continue on your current medications as directed. Please refer to the Current Medication list given to you today.   *If you need a refill on your cardiac medications before your next appointment, please call your pharmacy*  Lab Work: NONEI  Testing/Procedures: NONE  Follow-Up: At [facility], you and your health needs are our priority.  As part of our continuing mission to provide you with exceptional heart care, we have created designated Provider Care Teams.  These Care Teams include your primary Cardiologist (physician) and Advanced Practice Providers (APPs -  Physician Assistants and Nurse Practitioners) who all work together to provide you with the care you need, when you need it.  We recommend signing up for the patient portal called "MyChart".  Sign up information is provided on this After Visit Summary.  MyChart is used to connect with patients for Virtual Visits (Telemedicine).  Patients are able to view lab/test results, encounter notes, upcoming appointments, etc.  Non-urgent messages can be sent to your provider as well.   To learn more about what you can do with MyChart, go to [email or web address].    Your next appointment:   12 month(s)  Provider:   Chilton Si, MD

## 2022-10-05 NOTE — Progress Notes (Signed)
Cardiology Office Note:  .    Date:  10/05/2022  ID:  [name] [name], DOB 1951/01/22, MRN [id number] PCP: Garlan Fillers, MD  [facility] Providers Cardiologist:  Chilton Si, MD     History of Present Illness: .    [name] [name] is a 72 y.o. male with atrial fibrillation s/p ablation x2 [facility]), HFpEF, hypertension, hyperlipidemia, DM, and prior prostate cancer who presents for follow up.  He had ablations at [facility] most recently in 2016.  That was reportedly complicated by cardiogenic shock requiring 4 days in ICU and intubation.  He last saw Dr. Eldridge Dace 03/2020 and had recurrent atrial flutter after a recent surgery.  He underwent TEE/DCCV on 03/2020.  He was started back on dronedarone, which he had previously tolerated.  Since then he followed up in the atrial fibrillation clinic.  He had rate controlled atrial fibrillation when seen 05/26/20.  This was thoguht to be triggered by a UTI and he was successfully cardiovereted 2/22.  He has lost 20 lb and no longer snores.   He follow-up with Gillian Shields, NP 02/2022 after an admission for symptomatic anemia with upper GI bleed.  EGD revealed nonbleeding gastric ulcer which was treated with epi injection and 3 clips.  Xarelto was held for 5 days post procedure.  He called the office with Kardia strips that showed rate controlled atrial fibrillation. At his visit 03/2022 Xarelto was swtiched to Eliquis and he was not interested in the [place] procedure.   Today, he is accompanied by a family member. Generally he appears well. He denies any recurring arrhythmias for a long time now. They also wished to discuss any possible long-term issues with taking Multaq. In the office today his blood pressure is 130/81. Sometimes he checks at home, but not frequently. Also his weight has decreased. Lately he isn't eating as much which may be attributable to his age. He has been staying active. Participates in his church and is outdoors  frequently. Recently finished painting his house. Enjoying retirement. He denies any palpitations, chest pain, shortness of breath, peripheral edema, lightheadedness, headaches, syncope, orthopnea, or PND.  ROS:  Please see the history of present illness. All other systems are reviewed and negative.   Studies Reviewed: .        Risk Assessment/Calculations:    CHA2DS2-VASc Score = 6   This indicates a 9.7% annual risk of stroke. The patient's score is based upon: CHF History: 1 HTN History: 1 Diabetes History: 1 Stroke History: 2 Vascular Disease History: 0 Age Score: 1 Gender Score: 0            Physical Exam:    VS:  BP 130/81 (BP Location: Right Arm, Patient Position: Sitting, Cuff Size: Large)   Pulse 63   Ht 5\' 7"  (1.702 m)   Wt 214 lb 8 oz (97.3 kg)   SpO2 97%   BMI 33.60 kg/m    Wt Readings from Last 3 Encounters:  10/05/22 214 lb 8 oz (97.3 kg)  03/14/22 221 lb 11.2 oz (100.6 kg)  03/08/22 222 lb (100.7 kg)    VS:  BP 130/81 (BP Location: Right Arm, Patient Position: Sitting, Cuff Size: Large)   Pulse 63   Ht 5\' 7"  (1.702 m)   Wt 214 lb 8 oz (97.3 kg)   SpO2 97%   BMI 33.60 kg/m  , BMI Body mass index is 33.6 kg/m. GENERAL:  Well appearing HEENT: Pupils equal round and reactive, fundi not visualized,  oral mucosa unremarkable NECK:  No jugular venous distention, waveform within normal limits, carotid upstroke brisk and symmetric, no bruits, no thyromegaly LUNGS:  Clear to auscultation bilaterally HEART:  RRR.  PMI not displaced or sustained,S1 and S2 within normal limits, no S3, no S4, no clicks, no rubs, no murmurs ABD:  Flat, positive bowel sounds normal in frequency in pitch, no bruits, no rebound, no guarding, no midline pulsatile mass, no hepatomegaly, no splenomegaly EXT:  2 plus pulses throughout, no edema, no cyanosis no clubbing SKIN:  No rashes no nodules NEURO:  Cranial nerves II through XII grossly intact, motor grossly intact throughout PSYCH:   Cognitively intact, oriented to person place and time  ASSESSMENT AND PLAN: .       # Paroxysmal Atrial Fibrillation: No recent episodes. Currently on Eliquis, Carvedilol, and Dronedarone (Multaq) with good tolerance and control. Patient and spouse inquired about long-term safety of Multaq, reassured that it has fewer side effects than Amiodarone. -Continue current regimen of Eliquis, Carvedilol, and Dronedarone. - No recent GI bleeding.  Not interested in [place]. -Next follow-up in 1 year, or sooner if any issues arise.  # Iron Deficiency Anemia: No recent bleeding issues. Blood counts and iron levels are normal. Patient continues to take iron supplement daily. -Continue daily iron supplementation.  # Hypertension: Blood pressure well-controlled on current regimen. Patient occasionally checks blood pressure at home. -Advise to check blood pressure at least once a month. -Continue current antihypertensive regimen.   # Hyperlipidemia:  Lipids well-controlled on rosuvastatin.  General Health Maintenance: -Encourage continued physical activity and hydration, especially when working outdoors. -Continue monitoring weight, which has been decreasing likely due to increased activity and decreased appetite.             Dispo:  FU with Shenekia Riess C. Duke Salvia, MD, [facility] in 1 year.  I,Mathew Stumpf,acting as a Neurosurgeon for Chilton Si, MD.,have documented all relevant documentation on the behalf of Chilton Si, MD,as directed by  Chilton Si, MD while in the presence of Chilton Si, MD.  I, Stone Spirito C. Duke Salvia, MD have reviewed all documentation for this visit.  The documentation of the exam, diagnosis, procedures, and orders on 10/05/2022 are all accurate and complete.   Signed, Chilton Si, MD

## 2022-10-05 NOTE — Progress Notes (Deleted)
  Cardiology Office Note:  .   Date:  10/05/2022  ID:  [name] [name], DOB [birth year], MRN [id number] PCP: [name] Fillers, MD  [facility] Providers Cardiologist:  [name] Si, MD { Click to update primary MD,subspecialty MD or APP then REFRESH:1}   History of Present Illness: .    [name] [name] is a 72 y.o. male with atrial fibrillation s/p ablation x2 [facility]), HFpEF, hypertension, hyperlipidemia, DM, and prior prostate cancer who presents for follow up.  He had ablations at [facility] most recently in 2016.  That was reportedly complicated by cardiogenic shock requiring 4 days in ICU and intubation.  He last saw Dr. Eldridge Dace 03/2020 and had recurrent atrial flutter after a recent surgery.  He underwent TEE/DCCV on 03/2020.  He was started back on dronedarone, which he had previously tolerated.  Since then he followed up in the atrial fibrillation clinic.  He had rate controlled atrial fibrillation when seen 05/26/20.  This was thoguht to be triggered by a UTI and he was successfully cardiovereted 2/22.  He has lost 20 lb and no longer snores.   He follow-up with [name] Shields, NP 02/2022 after an admission for symptomatic anemia with upper GI bleed.  EGD revealed nonbleeding gastric ulcer which was treated with epi injection and 3 clips.  Xarelto was held for 5 days post procedure.  He called the office with Kardia strips that showed rate controlled atrial fibrillation. At his visit 03/2022 Xarelto was swtiched to Eliquis and he was not interested in the [place] procedure.   ROS: ***  Studies Reviewed: .        *** Risk Assessment/Calculations:   {Does this patient have ATRIAL FIBRILLATION?:[phone]} No BP recorded.  {Refresh Note OR Click here to enter BP  :1}***       Physical Exam:   VS:  There were no vitals taken for this visit.   Wt Readings from Last 3 Encounters:  03/14/22 221 lb 11.2 oz (100.6 kg)  03/08/22 222 lb (100.7 kg)  02/21/22 226 lb (102.5 kg)    GEN:  Well nourished, well developed in no acute distress NECK: No JVD; No carotid bruits CARDIAC: ***RRR, no murmurs, rubs, gallops RESPIRATORY:  Clear to auscultation without rales, wheezing or rhonchi  ABDOMEN: Soft, non-tender, non-distended EXTREMITIES:  No edema; No deformity   ASSESSMENT AND PLAN: .   ***    {Are you ordering a CV Procedure (e.g. stress test, cath, DCCV, TEE, etc)?   Press F2        :[id number]}  Dispo: ***  Signed, [name] Si, MD

## 2022-12-01 ENCOUNTER — Encounter (HOSPITAL_BASED_OUTPATIENT_CLINIC_OR_DEPARTMENT_OTHER): Payer: Self-pay | Admitting: Cardiovascular Disease

## 2022-12-01 DIAGNOSIS — I480 Paroxysmal atrial fibrillation: Secondary | ICD-10-CM

## 2022-12-02 MED ORDER — APIXABAN 5 MG PO TABS: 5.0000 mg | ORAL_TABLET | Freq: Two times a day (BID) | ORAL | 1 refills | Status: DC

## 2022-12-02 NOTE — Telephone Encounter (Signed)
Eliquis refill please °

## 2022-12-02 NOTE — Telephone Encounter (Signed)
Prescription refill request for Eliquis received. Indication: Afib Last office visit: 10/03/22 [facility])  Scr: 1.35 (03/18/22)  Age: 72 Weight: 97.3kg  Appropriate dose. Refill sent.

## 2022-12-15 DIAGNOSIS — I129 Hypertensive chronic kidney disease with stage 1 through stage 4 chronic kidney disease, or unspecified chronic kidney disease: Secondary | ICD-10-CM | POA: Diagnosis not present

## 2022-12-15 DIAGNOSIS — E1121 Type 2 diabetes mellitus with diabetic nephropathy: Secondary | ICD-10-CM | POA: Diagnosis not present

## 2022-12-15 DIAGNOSIS — N1831 Chronic kidney disease, stage 3a: Secondary | ICD-10-CM | POA: Diagnosis not present

## 2022-12-15 DIAGNOSIS — E669 Obesity, unspecified: Secondary | ICD-10-CM | POA: Diagnosis not present

## 2022-12-15 DIAGNOSIS — G4733 Obstructive sleep apnea (adult) (pediatric): Secondary | ICD-10-CM | POA: Diagnosis not present

## 2022-12-15 DIAGNOSIS — E782 Mixed hyperlipidemia: Secondary | ICD-10-CM | POA: Diagnosis not present

## 2022-12-19 ENCOUNTER — Encounter (HOSPITAL_BASED_OUTPATIENT_CLINIC_OR_DEPARTMENT_OTHER): Payer: Self-pay

## 2023-01-23 DIAGNOSIS — Z23 Encounter for immunization: Secondary | ICD-10-CM | POA: Diagnosis not present

## 2023-02-13 DIAGNOSIS — D509 Iron deficiency anemia, unspecified: Secondary | ICD-10-CM | POA: Diagnosis not present

## 2023-02-13 DIAGNOSIS — E1121 Type 2 diabetes mellitus with diabetic nephropathy: Secondary | ICD-10-CM | POA: Diagnosis not present

## 2023-02-13 DIAGNOSIS — Z0000 Encounter for general adult medical examination without abnormal findings: Secondary | ICD-10-CM | POA: Diagnosis not present

## 2023-02-13 DIAGNOSIS — E890 Postprocedural hypothyroidism: Secondary | ICD-10-CM | POA: Diagnosis not present

## 2023-02-13 DIAGNOSIS — Z0189 Encounter for other specified special examinations: Secondary | ICD-10-CM | POA: Diagnosis not present

## 2023-02-13 DIAGNOSIS — I10 Essential (primary) hypertension: Secondary | ICD-10-CM | POA: Diagnosis not present

## 2023-02-13 LAB — COMPREHENSIVE METABOLIC PANEL: EGFR: 75

## 2023-02-13 LAB — HEMOGLOBIN A1C: A1c: 7.2

## 2023-02-20 DIAGNOSIS — E782 Mixed hyperlipidemia: Secondary | ICD-10-CM | POA: Diagnosis not present

## 2023-02-20 DIAGNOSIS — I129 Hypertensive chronic kidney disease with stage 1 through stage 4 chronic kidney disease, or unspecified chronic kidney disease: Secondary | ICD-10-CM | POA: Diagnosis not present

## 2023-02-20 DIAGNOSIS — G4733 Obstructive sleep apnea (adult) (pediatric): Secondary | ICD-10-CM | POA: Diagnosis not present

## 2023-02-20 DIAGNOSIS — I480 Paroxysmal atrial fibrillation: Secondary | ICD-10-CM | POA: Diagnosis not present

## 2023-02-20 DIAGNOSIS — R82998 Other abnormal findings in urine: Secondary | ICD-10-CM | POA: Diagnosis not present

## 2023-02-20 DIAGNOSIS — E1151 Type 2 diabetes mellitus with diabetic peripheral angiopathy without gangrene: Secondary | ICD-10-CM | POA: Diagnosis not present

## 2023-02-20 DIAGNOSIS — R1013 Epigastric pain: Secondary | ICD-10-CM | POA: Diagnosis not present

## 2023-02-20 DIAGNOSIS — I700 Atherosclerosis of aorta: Secondary | ICD-10-CM | POA: Diagnosis not present

## 2023-02-20 DIAGNOSIS — N1831 Chronic kidney disease, stage 3a: Secondary | ICD-10-CM | POA: Diagnosis not present

## 2023-02-20 DIAGNOSIS — E669 Obesity, unspecified: Secondary | ICD-10-CM | POA: Diagnosis not present

## 2023-02-20 DIAGNOSIS — D6869 Other thrombophilia: Secondary | ICD-10-CM | POA: Diagnosis not present

## 2023-02-20 DIAGNOSIS — I739 Peripheral vascular disease, unspecified: Secondary | ICD-10-CM | POA: Diagnosis not present

## 2023-02-20 DIAGNOSIS — Z0000 Encounter for general adult medical examination without abnormal findings: Secondary | ICD-10-CM | POA: Diagnosis not present

## 2023-02-25 ENCOUNTER — Encounter (HOSPITAL_COMMUNITY): Payer: Self-pay

## 2023-02-25 ENCOUNTER — Inpatient Hospital Stay (HOSPITAL_COMMUNITY)
Admission: EM | Admit: 2023-02-25 | Discharge: 2023-02-27 | DRG: 202 | Disposition: A | Discharge status: Home / Self Care | Payer: Medicare Other | Attending: Student | Admitting: Student

## 2023-02-25 ENCOUNTER — Other Ambulatory Visit: Payer: Self-pay

## 2023-02-25 ENCOUNTER — Emergency Department (HOSPITAL_COMMUNITY): Payer: Medicare Other

## 2023-02-25 DIAGNOSIS — N1831 Chronic kidney disease, stage 3a: Secondary | ICD-10-CM | POA: Diagnosis present

## 2023-02-25 DIAGNOSIS — E1142 Type 2 diabetes mellitus with diabetic polyneuropathy: Secondary | ICD-10-CM | POA: Diagnosis present

## 2023-02-25 DIAGNOSIS — E039 Hypothyroidism, unspecified: Secondary | ICD-10-CM | POA: Diagnosis present

## 2023-02-25 DIAGNOSIS — I480 Paroxysmal atrial fibrillation: Secondary | ICD-10-CM | POA: Diagnosis present

## 2023-02-25 DIAGNOSIS — Z79899 Other long term (current) drug therapy: Secondary | ICD-10-CM | POA: Diagnosis not present

## 2023-02-25 DIAGNOSIS — E890 Postprocedural hypothyroidism: Secondary | ICD-10-CM | POA: Diagnosis present

## 2023-02-25 DIAGNOSIS — Z7901 Long term (current) use of anticoagulants: Secondary | ICD-10-CM

## 2023-02-25 DIAGNOSIS — G4733 Obstructive sleep apnea (adult) (pediatric): Secondary | ICD-10-CM | POA: Diagnosis present

## 2023-02-25 DIAGNOSIS — I4892 Unspecified atrial flutter: Secondary | ICD-10-CM | POA: Diagnosis present

## 2023-02-25 DIAGNOSIS — J206 Acute bronchitis due to rhinovirus: Principal | ICD-10-CM | POA: Diagnosis present

## 2023-02-25 DIAGNOSIS — I5032 Chronic diastolic (congestive) heart failure: Secondary | ICD-10-CM | POA: Diagnosis not present

## 2023-02-25 DIAGNOSIS — Z7984 Long term (current) use of oral hypoglycemic drugs: Secondary | ICD-10-CM

## 2023-02-25 DIAGNOSIS — E785 Hyperlipidemia, unspecified: Secondary | ICD-10-CM | POA: Diagnosis not present

## 2023-02-25 DIAGNOSIS — I5042 Chronic combined systolic (congestive) and diastolic (congestive) heart failure: Secondary | ICD-10-CM | POA: Diagnosis present

## 2023-02-25 DIAGNOSIS — E1165 Type 2 diabetes mellitus with hyperglycemia: Secondary | ICD-10-CM | POA: Diagnosis not present

## 2023-02-25 DIAGNOSIS — J209 Acute bronchitis, unspecified: Secondary | ICD-10-CM

## 2023-02-25 DIAGNOSIS — Z7985 Long-term (current) use of injectable non-insulin antidiabetic drugs: Secondary | ICD-10-CM

## 2023-02-25 DIAGNOSIS — K5730 Diverticulosis of large intestine without perforation or abscess without bleeding: Secondary | ICD-10-CM | POA: Diagnosis not present

## 2023-02-25 DIAGNOSIS — E876 Hypokalemia: Secondary | ICD-10-CM | POA: Diagnosis not present

## 2023-02-25 DIAGNOSIS — D649 Anemia, unspecified: Secondary | ICD-10-CM | POA: Diagnosis present

## 2023-02-25 DIAGNOSIS — Z833 Family history of diabetes mellitus: Secondary | ICD-10-CM

## 2023-02-25 DIAGNOSIS — T380X5A Adverse effect of glucocorticoids and synthetic analogues, initial encounter: Secondary | ICD-10-CM | POA: Diagnosis not present

## 2023-02-25 DIAGNOSIS — R0602 Shortness of breath: Secondary | ICD-10-CM | POA: Diagnosis not present

## 2023-02-25 DIAGNOSIS — B9789 Other viral agents as the cause of diseases classified elsewhere: Secondary | ICD-10-CM | POA: Diagnosis present

## 2023-02-25 DIAGNOSIS — Z1152 Encounter for screening for COVID-19: Secondary | ICD-10-CM | POA: Diagnosis not present

## 2023-02-25 DIAGNOSIS — R062 Wheezing: Secondary | ICD-10-CM | POA: Diagnosis not present

## 2023-02-25 DIAGNOSIS — Z6833 Body mass index (BMI) 33.0-33.9, adult: Secondary | ICD-10-CM

## 2023-02-25 DIAGNOSIS — Z79890 Hormone replacement therapy (postmenopausal): Secondary | ICD-10-CM | POA: Diagnosis not present

## 2023-02-25 DIAGNOSIS — Z886 Allergy status to analgesic agent status: Secondary | ICD-10-CM

## 2023-02-25 DIAGNOSIS — I130 Hypertensive heart and chronic kidney disease with heart failure and stage 1 through stage 4 chronic kidney disease, or unspecified chronic kidney disease: Secondary | ICD-10-CM | POA: Diagnosis present

## 2023-02-25 DIAGNOSIS — R918 Other nonspecific abnormal finding of lung field: Secondary | ICD-10-CM | POA: Diagnosis not present

## 2023-02-25 DIAGNOSIS — E1122 Type 2 diabetes mellitus with diabetic chronic kidney disease: Secondary | ICD-10-CM | POA: Diagnosis present

## 2023-02-25 DIAGNOSIS — E119 Type 2 diabetes mellitus without complications: Secondary | ICD-10-CM

## 2023-02-25 DIAGNOSIS — Z888 Allergy status to other drugs, medicaments and biological substances status: Secondary | ICD-10-CM

## 2023-02-25 DIAGNOSIS — Z8711 Personal history of peptic ulcer disease: Secondary | ICD-10-CM

## 2023-02-25 DIAGNOSIS — M810 Age-related osteoporosis without current pathological fracture: Secondary | ICD-10-CM | POA: Diagnosis not present

## 2023-02-25 DIAGNOSIS — Z87442 Personal history of urinary calculi: Secondary | ICD-10-CM

## 2023-02-25 DIAGNOSIS — Z8546 Personal history of malignant neoplasm of prostate: Secondary | ICD-10-CM

## 2023-02-25 DIAGNOSIS — K4090 Unilateral inguinal hernia, without obstruction or gangrene, not specified as recurrent: Secondary | ICD-10-CM | POA: Diagnosis not present

## 2023-02-25 DIAGNOSIS — E038 Other specified hypothyroidism: Secondary | ICD-10-CM | POA: Diagnosis not present

## 2023-02-25 DIAGNOSIS — Z83719 Family history of colon polyps, unspecified: Secondary | ICD-10-CM

## 2023-02-25 DIAGNOSIS — N200 Calculus of kidney: Secondary | ICD-10-CM | POA: Diagnosis not present

## 2023-02-25 DIAGNOSIS — K8020 Calculus of gallbladder without cholecystitis without obstruction: Secondary | ICD-10-CM | POA: Diagnosis not present

## 2023-02-25 DIAGNOSIS — Z860101 Personal history of adenomatous and serrated colon polyps: Secondary | ICD-10-CM

## 2023-02-25 DIAGNOSIS — B348 Other viral infections of unspecified site: Secondary | ICD-10-CM | POA: Diagnosis not present

## 2023-02-25 LAB — CBG MONITORING, ED: Glucose-Capillary: 180 mg/dL — ABNORMAL HIGH (ref 70–99)

## 2023-02-25 LAB — COMPREHENSIVE METABOLIC PANEL
ALT: 22 U/L (ref 0–44)
AST: 20 U/L (ref 15–41)
Albumin: 3.8 g/dL (ref 3.5–5.0)
Alkaline Phosphatase: 73 U/L (ref 38–126)
Anion gap: 8 (ref 5–15)
BUN: 16 mg/dL (ref 8–23)
CO2: 20 mmol/L — ABNORMAL LOW (ref 22–32)
Calcium: 8.3 mg/dL — ABNORMAL LOW (ref 8.9–10.3)
Chloride: 105 mmol/L (ref 98–111)
Creatinine, Ser: 1.43 mg/dL — ABNORMAL HIGH (ref 0.61–1.24)
GFR, Estimated: 52 mL/min — ABNORMAL LOW (ref >60)
Glucose, Bld: 131 mg/dL — ABNORMAL HIGH (ref 70–99)
Potassium: 3.4 mmol/L — ABNORMAL LOW (ref 3.5–5.1)
Sodium: 133 mmol/L — ABNORMAL LOW (ref 135–145)
Total Bilirubin: 0.7 mg/dL (ref <1.2)
Total Protein: 6.8 g/dL (ref 6.5–8.1)

## 2023-02-25 LAB — PHOSPHORUS: Phosphorus: 2.8 mg/dL (ref 2.5–4.6)

## 2023-02-25 LAB — CBC WITH DIFFERENTIAL/PLATELET
Abs Immature Granulocytes: 0.07 10*3/uL (ref 0.00–0.07)
Basophils Absolute: 0 10*3/uL (ref 0.0–0.1)
Basophils Relative: 0 %
Eosinophils Absolute: 0.2 10*3/uL (ref 0.0–0.5)
Eosinophils Relative: 2 %
HCT: 39.7 % (ref 39.0–52.0)
Hemoglobin: 12.5 g/dL — ABNORMAL LOW (ref 13.0–17.0)
Immature Granulocytes: 1 %
Lymphocytes Relative: 15 %
Lymphs Abs: 1.5 10*3/uL (ref 0.7–4.0)
MCH: 30.2 pg (ref 26.0–34.0)
MCHC: 31.5 g/dL (ref 30.0–36.0)
MCV: 95.9 fL (ref 80.0–100.0)
Monocytes Absolute: 0.8 10*3/uL (ref 0.1–1.0)
Monocytes Relative: 8 %
Neutro Abs: 7.3 10*3/uL (ref 1.7–7.7)
Neutrophils Relative %: 74 %
Platelets: 207 10*3/uL (ref 150–400)
RBC: 4.14 MIL/uL — ABNORMAL LOW (ref 4.22–5.81)
RDW: 14.9 % (ref 11.5–15.5)
WBC: 9.9 10*3/uL (ref 4.0–10.5)
nRBC: 0 % (ref 0.0–0.2)

## 2023-02-25 LAB — GLUCOSE, CAPILLARY
Glucose-Capillary: 213 mg/dL — ABNORMAL HIGH (ref 70–99)
Glucose-Capillary: 242 mg/dL — ABNORMAL HIGH (ref 70–99)

## 2023-02-25 LAB — MAGNESIUM: Magnesium: 2 mg/dL (ref 1.7–2.4)

## 2023-02-25 LAB — URINALYSIS, ROUTINE W REFLEX MICROSCOPIC
Bacteria, UA: NONE SEEN
Bilirubin Urine: NEGATIVE
Glucose, UA: >=500 mg/dL — AB
Hgb urine dipstick: NEGATIVE
Ketones, ur: NEGATIVE mg/dL
Leukocytes,Ua: NEGATIVE
Nitrite: NEGATIVE
Protein, ur: 30 mg/dL — AB
Specific Gravity, Urine: 1.028 (ref 1.005–1.030)
pH: 5 (ref 5.0–8.0)

## 2023-02-25 LAB — BLOOD GAS, VENOUS
Acid-base deficit: 0.5 mmol/L (ref 0.0–2.0)
Bicarbonate: 24.2 mmol/L (ref 20.0–28.0)
O2 Saturation: 53.1 %
Patient temperature: 36.1
pCO2, Ven: 38 mm[Hg] — ABNORMAL LOW (ref 44–60)
pH, Ven: 7.41 (ref 7.25–7.43)
pO2, Ven: 32 mm[Hg] (ref 32–45)

## 2023-02-25 LAB — HEMOGLOBIN A1C
Hgb A1c MFr Bld: 6.7 % — ABNORMAL HIGH (ref 4.8–5.6)
Mean Plasma Glucose: 145.59 mg/dL

## 2023-02-25 LAB — TROPONIN I (HIGH SENSITIVITY)
Troponin I (High Sensitivity): 19 ng/L — ABNORMAL HIGH (ref <18)
Troponin I (High Sensitivity): 17 ng/L (ref <18)

## 2023-02-25 LAB — BRAIN NATRIURETIC PEPTIDE: B Natriuretic Peptide: 161.3 pg/mL — ABNORMAL HIGH (ref 0.0–100.0)

## 2023-02-25 LAB — SARS CORONAVIRUS 2 BY RT PCR: SARS Coronavirus 2 by RT PCR: NEGATIVE

## 2023-02-25 MED ORDER — METHYLPREDNISOLONE SODIUM SUCC 125 MG IJ SOLR: 80.0000 mg | Freq: Two times a day (BID) | INTRAMUSCULAR | Status: DC

## 2023-02-25 MED ORDER — IPRATROPIUM-ALBUTEROL 0.5-2.5 (3) MG/3ML IN SOLN
9.0000 mL | Freq: Once | RESPIRATORY_TRACT | Status: AC
  Administered 2023-02-25: 9 mL via RESPIRATORY_TRACT
  Filled 2023-02-25: qty 3

## 2023-02-25 MED ORDER — EMPAGLIFLOZIN 25 MG PO TABS
25.0000 mg | ORAL_TABLET | Freq: Every day | ORAL | Status: DC
Start: 2023-02-25 — End: 2023-02-27
  Administered 2023-02-25 – 2023-02-27 (×3): 25 mg via ORAL
  Filled 2023-02-25 (×3): qty 1

## 2023-02-25 MED ORDER — ALBUTEROL SULFATE (2.5 MG/3ML) 0.083% IN NEBU
10.0000 mg/h | INHALATION_SOLUTION | Freq: Once | RESPIRATORY_TRACT | Status: AC
  Administered 2023-02-25: 10 mg/h via RESPIRATORY_TRACT
  Filled 2023-02-25: qty 12

## 2023-02-25 MED ORDER — VITAMIN B-12 1000 MCG PO TABS
1000.0000 ug | ORAL_TABLET | Freq: Every day | ORAL | Status: DC
  Administered 2023-02-25 – 2023-02-27 (×3): 1000 ug via ORAL
  Filled 2023-02-25 (×3): qty 1

## 2023-02-25 MED ORDER — POTASSIUM CHLORIDE CRYS ER 20 MEQ PO TBCR
40.0000 meq | EXTENDED_RELEASE_TABLET | Freq: Once | ORAL | Status: AC
  Administered 2023-02-25: 40 meq via ORAL
  Filled 2023-02-25: qty 2

## 2023-02-25 MED ORDER — ACETAMINOPHEN 325 MG PO TABS: 650.0000 mg | ORAL_TABLET | Freq: Four times a day (QID) | ORAL | Status: DC | PRN

## 2023-02-25 MED ORDER — ACETAMINOPHEN 650 MG RE SUPP: 650.0000 mg | Freq: Four times a day (QID) | RECTAL | Status: DC | PRN

## 2023-02-25 MED ORDER — IPRATROPIUM-ALBUTEROL 0.5-2.5 (3) MG/3ML IN SOLN
3.0000 mL | Freq: Four times a day (QID) | RESPIRATORY_TRACT | Status: DC
  Administered 2023-02-25 – 2023-02-26 (×4): 3 mL via RESPIRATORY_TRACT
  Filled 2023-02-25 (×5): qty 3

## 2023-02-25 MED ORDER — LEVOTHYROXINE SODIUM 75 MCG PO TABS
175.0000 ug | ORAL_TABLET | Freq: Every day | ORAL | Status: DC
  Administered 2023-02-26 – 2023-02-27 (×2): 175 ug via ORAL
  Filled 2023-02-25 (×2): qty 1

## 2023-02-25 MED ORDER — MELATONIN 3 MG PO TABS: 3.0000 mg | ORAL_TABLET | Freq: Every evening | ORAL | Status: DC | PRN

## 2023-02-25 MED ORDER — ALBUTEROL SULFATE (2.5 MG/3ML) 0.083% IN NEBU: 2.5000 mg | INHALATION_SOLUTION | RESPIRATORY_TRACT | Status: DC | PRN

## 2023-02-25 MED ORDER — PANTOPRAZOLE SODIUM 40 MG PO TBEC
40.0000 mg | DELAYED_RELEASE_TABLET | Freq: Every day | ORAL | Status: DC
  Administered 2023-02-25 – 2023-02-27 (×3): 40 mg via ORAL
  Filled 2023-02-25 (×3): qty 1

## 2023-02-25 MED ORDER — METHYLPREDNISOLONE SODIUM SUCC 125 MG IJ SOLR
80.0000 mg | Freq: Every day | INTRAMUSCULAR | Status: DC
Start: 2023-02-26 — End: 2023-02-27
  Administered 2023-02-26 – 2023-02-27 (×2): 80 mg via INTRAVENOUS
  Filled 2023-02-25 (×2): qty 2

## 2023-02-25 MED ORDER — GUAIFENESIN-DM 100-10 MG/5ML PO SYRP
5.0000 mL | ORAL_SOLUTION | ORAL | Status: DC | PRN
  Administered 2023-02-25 – 2023-02-26 (×2): 5 mL via ORAL
  Filled 2023-02-25 (×3): qty 5

## 2023-02-25 MED ORDER — ONDANSETRON HCL 4 MG/2ML IJ SOLN: 4.0000 mg | Freq: Four times a day (QID) | INTRAMUSCULAR | Status: DC | PRN

## 2023-02-25 MED ORDER — DRONEDARONE HCL 400 MG PO TABS
400.0000 mg | ORAL_TABLET | Freq: Two times a day (BID) | ORAL | Status: DC
  Administered 2023-02-25 – 2023-02-27 (×4): 400 mg via ORAL
  Filled 2023-02-25 (×5): qty 1

## 2023-02-25 MED ORDER — DULAGLUTIDE 1.5 MG/0.5ML ~~LOC~~ SOAJ: 1.5000 mg | SUBCUTANEOUS | Status: DC

## 2023-02-25 MED ORDER — ROSUVASTATIN CALCIUM 10 MG PO TABS
10.0000 mg | ORAL_TABLET | Freq: Every day | ORAL | Status: DC
Start: 2023-02-25 — End: 2023-02-27
  Administered 2023-02-25 – 2023-02-27 (×3): 10 mg via ORAL
  Filled 2023-02-25 (×3): qty 1

## 2023-02-25 MED ORDER — METHYLPREDNISOLONE SODIUM SUCC 125 MG IJ SOLR
125.0000 mg | Freq: Once | INTRAMUSCULAR | Status: AC
  Administered 2023-02-25: 125 mg via INTRAVENOUS
  Filled 2023-02-25: qty 2

## 2023-02-25 MED ORDER — APIXABAN 5 MG PO TABS
5.0000 mg | ORAL_TABLET | Freq: Two times a day (BID) | ORAL | Status: DC
  Administered 2023-02-25 – 2023-02-27 (×4): 5 mg via ORAL
  Filled 2023-02-25 (×4): qty 1

## 2023-02-25 MED ORDER — METFORMIN HCL ER 500 MG PO TB24
1000.0000 mg | ORAL_TABLET | Freq: Two times a day (BID) | ORAL | Status: DC
  Administered 2023-02-25 – 2023-02-27 (×4): 1000 mg via ORAL
  Filled 2023-02-25 (×5): qty 2

## 2023-02-25 MED ORDER — INSULIN ASPART 100 UNIT/ML IJ SOLN
2.0000 [IU] | Freq: Once | INTRAMUSCULAR | Status: AC
  Administered 2023-02-25: 2 [IU] via SUBCUTANEOUS

## 2023-02-25 MED ORDER — INSULIN ASPART 100 UNIT/ML IJ SOLN
0.0000 [IU] | Freq: Three times a day (TID) | INTRAMUSCULAR | Status: DC
  Administered 2023-02-25: 7 [IU] via SUBCUTANEOUS
  Administered 2023-02-25 – 2023-02-26 (×2): 4 [IU] via SUBCUTANEOUS
  Administered 2023-02-26: 3 [IU] via SUBCUTANEOUS
  Administered 2023-02-26: 4 [IU] via SUBCUTANEOUS
  Administered 2023-02-27: 3 [IU] via SUBCUTANEOUS
  Filled 2023-02-25: qty 0.2

## 2023-02-25 MED ORDER — CARVEDILOL 12.5 MG PO TABS
37.5000 mg | ORAL_TABLET | Freq: Two times a day (BID) | ORAL | Status: DC
  Administered 2023-02-25 – 2023-02-27 (×4): 37.5 mg via ORAL
  Filled 2023-02-25 (×4): qty 1

## 2023-02-25 MED ORDER — DULOXETINE HCL 60 MG PO CPEP
60.0000 mg | ORAL_CAPSULE | Freq: Every day | ORAL | Status: DC
  Administered 2023-02-25 – 2023-02-27 (×3): 60 mg via ORAL
  Filled 2023-02-25: qty 2
  Filled 2023-02-25 (×2): qty 1

## 2023-02-25 MED ORDER — LOSARTAN POTASSIUM 50 MG PO TABS
50.0000 mg | ORAL_TABLET | Freq: Every evening | ORAL | Status: DC
  Administered 2023-02-25 – 2023-02-26 (×2): 50 mg via ORAL
  Filled 2023-02-25 (×2): qty 1

## 2023-02-25 NOTE — ED Triage Notes (Addendum)
Pt to ED by pov from home with c/o SOB and cough. Pt states he woke up yesterday morning with a cold and that it has gotten worse throughout the past 24 hours. Pt also endorses currently having a kidney stone.Pt noted to have labored breathing throughout triage. Arrives A+O, VSS. At home covid test was negative.

## 2023-02-25 NOTE — H&P (Signed)
History and Physical    Patient: [name] [name] ZOX:[id number] DOB: November 03, 1950 DOA: 02/25/2023 DOS: the patient was seen and examined on 02/25/2023 PCP: Garlan Fillers, MD  Patient coming from: Home  Chief Complaint:  Chief Complaint  Patient presents with   Shortness of Breath   HPI: [name] [name] is a 72 y.o. male with medical history significant of osteoarthritis, atrial flutter, paroxysmal atrial fibrillation, prostate cancer, chronic combined systolic diastolic heart failure, type 2 diabetes, diverticulosis, diverticulitis, hyperlipidemia, ED, history of gastric ulcer with history of prior surgery, GERD, headache, hypertension, hypothyroidism, nephrolithiasis, microcytic anemia, obstructive sleep apnea, osteoporosis, thrombocytopenia, tubular adenoma of the colon who presented to the emergency department with complaints of dyspnea and wheezing after having URI symptoms for the past 2 days.  No travel history or sick contacts, however he was at the doctor's office earlier this week. He denied fever or hemoptysis.  No chest pain, palpitations, diaphoresis, PND, orthopnea or pitting edema of the lower extremities.  No abdominal pain, nausea, emesis, diarrhea, constipation, melena or hematochezia.  No flank pain, dysuria, frequency or hematuria.  No polyuria, polydipsia, polyphagia or blurred vision.   Lab work: Urine analysis showed greater than 500 glucose and protein of 30 mg/dL.  CBC a white count of 9.9, hemoglobin 12.5 g/dL and platelets 811.  Troponin was 19 then 17 ng/L and BNP 861.3 pg/mL.  Venous blood gas was normal with EF session of a pCO2 of 38 mmHg.  CMP showed sodium 133, potassium 3.4, chloride 105 and CO2 20 mmol/L with a normal anion gap.  Glucose 131, BUN 16, creatinine 1.43 and corrected calcium 8.5 mg/dL.  LFTs were normal.  Coronavirus PCR was negative.  Imaging: Portable 1 view chest radiograph with stable cardiac shadow and mild peribronchial cuffing without focal  confluent infiltrate.  Likely due to viral bronchitis etiology.   ED course: Initial vital signs were temperature 99.7 degrees, pulse 75, respiration 18, BP 154/86 mmHg O2 sats 97% on room air.  The patient received a continuous albuterol 10 mg neb, a DuoNeb, 125 mg of methylprednisolone.  Review of Systems: As mentioned in the history of present illness. All other systems reviewed and are negative. Past Medical History:  Diagnosis Date   Arthritis    OA   Atrial flutter (HCC)    a. s/p RFCA 8/13   Cancer (HCC) 2010   prostate   Chronic combined systolic and diastolic CHF (congestive heart failure) (HCC)    a. LVEF previously 35% felt to be due tachycardia;  b. 02/2013 Echo: EF 50-55%, no rwma, mildly dil LA/RA, mild to mod MR. C 11/2014 echo - ef 60-65%, unable to determine DD   CKD (chronic kidney disease), stage III (HCC)    hx of a/c renal failure during episode of diverticulitis 9/13 in [place], [place]   Diabetes mellitus (HCC)    TYPE 2    Diverticulitis    Diverticulosis    Dyslipidemia    Dysrhythmia    Erectile dysfunction    Gastric ulcer    s/p prior surgery   GERD (gastroesophageal reflux disease)    Headache    History of colonic diverticulitis    History of prostate cancer    Hypertension    Hypothyroid    Kidney stones    Microcytic anemia    Obesities, morbid (HCC)    Obstructive sleep apnea    noncompliant with CPAP   Osteoporosis    PAF (paroxysmal atrial fibrillation) (HCC)  a. failed DCCV and multiple anti-arrhythmic drugs (multaq/flecainide);   b. s/p  PVI isolation with ablation of AFib 8/13; c. repeat PVI 01/2013;  d. 02/2013 repeat DCCV->Amio load/xarelto.   Peptic ulcer disease 1994   Renal calculi    Sleep apnea 2015   has not had sleep apnea since heart intervention   Thrombocytopenia (HCC) 1992   idopathic-treated with danazol   Tubular adenoma of colon    Past Surgical History:  Procedure Laterality Date   ATRIAL FIBRILLATION ABLATION   12/06/11; 02/05/2013   Afib and atrial flutter ablation by Dr Johney Frame; repeat PVI by Dr Johney Frame 02/05/2013   ATRIAL FIBRILLATION ABLATION N/A 12/06/2011   Procedure: ATRIAL FIBRILLATION ABLATION;  Surgeon: Hillis Range, MD;  Location: [facility] CATH LAB;  Service: Cardiovascular;  Laterality: N/A;   ATRIAL FIBRILLATION ABLATION N/A 02/05/2013   Procedure: ATRIAL FIBRILLATION ABLATION;  Surgeon: Gardiner Rhyme, MD;  Location: [facility] CATH LAB;  Service: Cardiovascular;  Laterality: N/A;   benign stomach tumor removal  2000   CARDIOVERSION  08/25/2011   Procedure: CARDIOVERSION;  Surgeon: Corky Crafts, MD;  Location: [facility] ENDOSCOPY;  Service: Cardiovascular;  Laterality: N/A;   CARDIOVERSION N/A 03/05/2013   Procedure: CARDIOVERSION;  Surgeon: Lesleigh Noe, MD;  Location: [facility] OR;  Service: Cardiovascular;  Laterality: N/A;  BEDSIDE    CARDIOVERSION N/A 03/25/2020   Procedure: CARDIOVERSION;  Surgeon: Chilton Si, MD;  Location: [facility] ENDOSCOPY;  Service: Cardiovascular;  Laterality: N/A;   CARDIOVERSION N/A 06/02/2020   Procedure: CARDIOVERSION;  Surgeon: Chrystie Nose, MD;  Location: [facility] ENDOSCOPY;  Service: Cardiovascular;  Laterality: N/A;   CARPAL TUNNEL WITH CUBITAL TUNNEL Right 02/25/2020   Procedure: RIGHT CARPAL TUNNEL RELEASE, CUBITAL TUNNEL RELEASE IN SITU, RIGHT WRIST PROXIMAL ROW CARPECTOMY AND PROXIMAL CAPITATE HEAD REPLACEMENT/HEMIARTHROPLASTY WITH POSTERIOR INTEROSSOUS NERVE NEURECTOMY AND REPAIR;  Surgeon: Dominica Severin, MD;  Location: [facility] OR;  Service: Orthopedics;  Laterality: Right;  2.5 hrs Block with IV Sedation   convergent afib abaltion [facility] 02/26/15  02/26/2015   [facility] by Dr. Hurman Horn and Dr. Sindy Guadeloupe   ELECTROPHYSIOLOGIC STUDY N/A 11/24/2014   Procedure: Cardioversion;  Surgeon: Will Jorja Loa, MD;  Location: [facility] INVASIVE CV LAB;  Service: Cardiovascular;  Laterality: N/A;   ESOPHAGOGASTRODUODENOSCOPY N/A 04/09/2013   Procedure: ESOPHAGOGASTRODUODENOSCOPY (EGD) with  possible Balloon Dilation;  Surgeon: Charolett Bumpers, MD;  Location: [facility] ENDOSCOPY;  Service: Endoscopy;  Laterality: N/A;   ESOPHAGOGASTRODUODENOSCOPY (EGD) WITH PROPOFOL N/A 01/24/2022   Procedure: ESOPHAGOGASTRODUODENOSCOPY (EGD) WITH PROPOFOL;  Surgeon: Sherrilyn Rist, MD;  Location: [facility] ENDOSCOPY;  Service: Gastroenterology;  Laterality: N/A;   HEMOSTASIS CLIP PLACEMENT  01/24/2022   Procedure: HEMOSTASIS CLIP PLACEMENT;  Surgeon: Sherrilyn Rist, MD;  Location: [facility] ENDOSCOPY;  Service: Gastroenterology;;   kidney stone removal     multiple   KNEE ARTHROSCOPY Bilateral 1979   LAPAROSCOPIC RETROPUBIC PROSTATECTOMY  02/2007   hx prostate cancer   ORIF ANKLE FRACTURE Right 10/2015   ORIF ANKLE FRACTURE Right 11/03/2015   Procedure: ORIF RIGHT LISFRANK FOOT;  Surgeon: Nadara Mustard, MD;  Location: [facility] OR;  Service: Orthopedics;  Laterality: Right;   SUBMUCOSAL INJECTION  01/24/2022   Procedure: SUBMUCOSAL INJECTION;  Surgeon: Sherrilyn Rist, MD;  Location: [facility] ENDOSCOPY;  Service: Gastroenterology;;   TEE WITHOUT CARDIOVERSION  08/25/2011   Procedure: TRANSESOPHAGEAL ECHOCARDIOGRAM (TEE);  Surgeon: Corky Crafts, MD;  Location: [facility] ENDOSCOPY;  Service: Cardiovascular;  Laterality: N/A;   TEE WITHOUT CARDIOVERSION  11/09/2011  Procedure: TRANSESOPHAGEAL ECHOCARDIOGRAM (TEE);  Surgeon: Corky Crafts, MD;  Location: [facility] ENDOSCOPY;  Service: Cardiovascular;  Laterality: N/A;  h/p in file drawer   TEE WITHOUT CARDIOVERSION N/A 02/05/2013   Procedure: TRANSESOPHAGEAL ECHOCARDIOGRAM (TEE);  Surgeon: Donato Schultz, MD;  Location: [facility] ENDOSCOPY;  Service: Cardiovascular;  Laterality: N/A;   TEE WITHOUT CARDIOVERSION N/A 03/25/2020   Procedure: TRANSESOPHAGEAL ECHOCARDIOGRAM (TEE);  Surgeon: Chilton Si, MD;  Location: [facility] ENDOSCOPY;  Service: Cardiovascular;  Laterality: N/A;   TOTAL THYROIDECTOMY  2009   benign nodules removed   UPPER GASTROINTESTINAL ENDOSCOPY     WRIST  SURGERY Right 1977   with placement of lunate prosthesis   Social History:  reports that he has never smoked. He has never used smokeless tobacco. He reports that he does not drink alcohol and does not use drugs.  Allergies  Allergen Reactions   Bystolic [Nebivolol Hcl] Swelling and Other (See Comments)    Bradycardia    Calcium Channel Blockers Swelling and Other (See Comments)    LE edema   Feraheme [Ferumoxytol] Shortness Of Breath, Nausea And Vomiting and Other (See Comments)    Chest pain   Phenergan [Promethazine Hcl] Itching and Other (See Comments)    Hallucination    Beta Adrenergic Blockers Swelling and Other (See Comments)    bradycardia   Cardizem [Diltiazem] Other (See Comments)    Foot swelling and increased his heart rate   Norvasc [Amlodipine] Other (See Comments)    Unknown reaction   Aspirin Other (See Comments)    Hx of ulcer    Nsaids Other (See Comments)    Hx ulcer   Prednisone Other (See Comments)    Hx of ulcer    Family History  Problem Relation Age of Onset   Emphysema Mother    Lung cancer Father    Diabetes Brother    Atrial fibrillation Brother    Colon polyps Brother    Throat cancer Sister    Colon polyps Sister    Lung cancer Sister    Colon polyps Sister    Colon cancer Neg Hx    Esophageal cancer Neg Hx    Rectal cancer Neg Hx    Stomach cancer Neg Hx     Prior to Admission medications   Medication Sig Start Date End Date Taking? Authorizing Provider  acetaminophen (TYLENOL) 500 MG tablet Take 1,000 mg by mouth every 6 (six) hours as needed for moderate pain or headache.    [provider]  apixaban (ELIQUIS) 5 MG TABS tablet Take 1 tablet (5 mg total) by mouth 2 (two) times daily. 12/02/22   Chilton Si, MD  carvedilol (COREG) 12.5 MG tablet Take 3 tablets (37.5 mg total) by mouth 2 (two) times daily. 05/25/22   Chilton Si, MD  dronedarone (MULTAQ) 400 MG tablet Take 1 tablet (400 mg total) by mouth 2 (two)  times daily with a meal. 03/24/22   Chilton Si, MD  Dulaglutide (TRULICITY) 1.5 MG/0.5ML SOPN Inject 1.5 mg into the skin once a week. 05/19/22     DULoxetine (CYMBALTA) 60 MG capsule Take 60 mg by mouth daily. 03/27/20   [provider]  FOLIC ACID PO Take 1,333 mcg by mouth daily.    [provider]  Glucosamine HCl 1500 MG TABS Take 1,500 mg by mouth in the morning and at bedtime.    [provider]  JARDIANCE 25 MG TABS tablet Take 25 mg by mouth daily. 06/12/19   [provider]  levothyroxine (SYNTHROID) 175 MCG tablet Take 175 mcg by mouth daily. 07/01/19   [provider]  losartan (COZAAR) 50 MG tablet Take 1 tablet (50 mg total) by mouth every evening. 05/26/22   Chilton Si, MD  Meclizine HCl (BONINE PO) Take 1 tablet by mouth daily as needed (nausea).    [provider]  metFORMIN (GLUCOPHAGE-XR) 500 MG 24 hr tablet Take 1,000 mg by mouth 2 (two) times daily. 07/15/19   [provider]  Multiple Vitamin (MULTIVITAMIN WITH MINERALS) TABS tablet Take 1 tablet by mouth daily. One-A-Day for Men    [provider]  Multiple Vitamins-Minerals (PRESERVISION AREDS 2) CAPS Take 1 capsule by mouth 2 (two) times daily.    [provider]  omeprazole (PRILOSEC) 40 MG capsule Take 40 mg by mouth daily.    [provider]  polycarbophil (FIBERCON) 625 MG tablet Take 1,250 mg by mouth every evening.    [provider]  Probiotic Product (PROBIOTIC PO) Take 1 capsule by mouth at bedtime.    [provider]  rosuvastatin (CRESTOR) 10 MG tablet Take 1 tablet (10 mg total) by mouth daily. 03/15/22   Chilton Si, MD  TRULICITY 0.75 MG/0.5ML SOPN Inject 1.5 mg into the skin once a week. Tuesdays 06/22/21   [provider]  vitamin B-12 (CYANOCOBALAMIN) 1000 MCG tablet Take 1,000 mcg by mouth daily.    [provider]  Vitamin D, Ergocalciferol, (DRISDOL) 1.25 MG (50000 UNIT)  CAPS capsule Take 50,000 Units by mouth every 7 (seven) days. Tuesdays 01/04/22   [provider]    Physical Exam: Vitals:   02/25/23 0418 02/25/23 0435 02/25/23 0445 02/25/23 0515  BP:   (!) 159/91 (!) 157/134  Pulse:   63 69  Resp:   (!) 23 19  Temp: 99.7 F (37.6 C)     TempSrc: Oral     SpO2:  94% 98% 100%   Physical Exam Vitals and nursing note reviewed.  Constitutional:      General: He is awake. He is not in acute distress.    Appearance: He is well-developed. He is not ill-appearing.  HENT:     Head: Normocephalic.     Nose: No rhinorrhea.     Mouth/Throat:     Mouth: Mucous membranes are dry.  Eyes:     General: No scleral icterus.    Pupils: Pupils are equal, round, and reactive to light.  Neck:     Vascular: No JVD.  Cardiovascular:     Rate and Rhythm: Normal rate and regular rhythm.     Heart sounds: S1 normal and S2 normal.  Pulmonary:     Breath sounds: Wheezing and rhonchi present. No rales.  Abdominal:     General: Bowel sounds are normal. There is no distension.     Palpations: Abdomen is soft.     Tenderness: There is no abdominal tenderness. There is no guarding.  Musculoskeletal:     Cervical back: Neck supple.     Right lower leg: No edema.     Left lower leg: No edema.  Skin:    General: Skin is warm and dry.  Neurological:     General: No focal deficit present.     Mental Status: He is alert and oriented to person, place, and time.  Psychiatric:        Mood and Affect: Mood normal.        Behavior: Behavior normal. Behavior is cooperative.     Data  Reviewed:  Results are pending, will review when available. 11/25/2014 transthoracic echocardiogram Study Conclusions:  - Left ventricle: The cavity size was normal. Wall thickness was    increased in a pattern of mild LVH. Systolic function was normal.    The estimated ejection fraction was in the range of 60% to 65%.    Indeterminant diastolic function (tachycardic). Although  no    diagnostic regional wall motion abnormality was identified, this    possibility cannot be completely excluded on the basis of this    study.  - Aortic valve: There was no stenosis.  - Aorta: Mildly dilated aortic root. Aortic root dimension: 38 mm    (ED).  - Mitral valve: There was no significant regurgitation.  - Left atrium: The atrium was mildly dilated.  - Right ventricle: The cavity size was normal. Systolic function    was normal.  - Pulmonary arteries: No complete TR doppler jet so unable to    estimate PA systolic pressure.   Impressions:   - The patient was tachycardic (sinus tachy versus atrial flutter).    Normal LV size with mild LV hypertrophy. EF 60-65%. Normal RV    size and systolic function. No significant valvular    abnormalities.   03/25/2020 TEE IMPRESSIONS:   1. Left ventricular ejection fraction, by estimation, is 60 to 65%. The  left ventricle has normal function. The left ventricle has no regional  wall motion abnormalities.   2. Right ventricular systolic function is normal. The right ventricular  size is normal.   3. No left atrial/left atrial appendage thrombus was detected. The LAA  emptying velocity was 86 cm/s.   4. The mitral valve is normal in structure. Mild mitral valve  regurgitation. No evidence of mitral stenosis.   5. The aortic valve is tricuspid. Aortic valve regurgitation is not  visualized. No aortic stenosis is present.   6. Aortic dilatation noted. There is mild dilatation of the ascending  aorta, measuring 38 mm.   7. The inferior vena cava is normal in size with greater than 50%  respiratory variability, suggesting right atrial pressure of 3 mmHg.   EKG: Vent. rate 74 BPM PR interval 205 ms QRS duration 111 ms QT/QTcB 414/460 ms P-R-T axes 24 -58 76 Sinus rhythm Ventricular premature complex Incomplete left bundle branch block LVH with secondary repolarization abnormality No significant change since last  tracing  Assessment and Plan: Principal Problem:   Acute bronchitis with bronchospasm Observation/telemetry Continue supplemental oxygen. Methylprednisolone 125 mg IVP x1. Followed by methylprednisolone 40 mg p.o. daily in a.m. Scheduled and as needed bronchodilators. Follow-up CBC and chemistry in the morning.   Active Problems:   Hypokalemia Replacing. Magnesium was supplemented. Follow-up potassium level.    Hypocalcemia   Normocytic anemia In the setting of chronic anticoagulation. Monitor hematocrit and hemoglobin.    Dyslipidemia Continue rosuvastatin 10 mg p.o. daily.    Paroxysmal atrial fibrillation (HCC) CHA?DS?-VASc Score of at least 5. Continue apixaban 5 mg p.o. daily. Continue dronedaradone 400 mg p.o. twice daily. Continue carvedilol 12.5 mg p.o. twice daily.    Chronic heart failure with preserved ejection fraction (HFpEF) (HCC) No signs of volume overload. Continue carvedilol 12.5 mg p.o. daily. Continue losartan 50 mg p.o. daily.    Obstructive sleep apnea Has intolerance to CPAP.    Chronic kidney disease, stage 3a (HCC) Monitor renal function and electrolytes.    Hypothyroidism Continue levothyroxine 175 mcg p.o. daily.    Diabetic polyneuropathy associated with type 2  diabetes mellitus (HCC) Analgesics as needed.    Type 2 diabetes mellitus (HCC) Carbohydrate modified diet. CBG monitoring with RI SS. Check hemoglobin A1c.    Advance Care Planning:   Code Status: Full Code   Consults:   Family Communication:   Severity of Illness: The appropriate patient status for this patient is INPATIENT. Inpatient status is judged to be reasonable and necessary in order to provide the required intensity of service to ensure the patient's safety. The patient's presenting symptoms, physical exam findings, and initial radiographic and laboratory data in the context of their chronic comorbidities is felt to place them at high risk for further clinical  deterioration. Furthermore, it is not anticipated that the patient will be medically stable for discharge from the hospital within 2 midnights of admission.   * I certify that at the point of admission it is my clinical judgment that the patient will require inpatient hospital care spanning beyond 2 midnights from the point of admission due to high intensity of service, high risk for further deterioration and high frequency of surveillance required.*  Author: Bobette Mo, MD 02/25/2023 7:48 AM  For on call review [email or web address].   This document was prepared using Dragon voice recognition software and may contain some unintended transcription errors.

## 2023-02-25 NOTE — Progress Notes (Signed)
  Carryover admission to the Day Admitter.  I discussed this case with the EDP, Dr. Posey Rea.  Per these discussions:   This is a 72 year old male with no known history of COPD, who is being admitted with suspected acute bronchitis with bronchospasm after presenting with 2 to 3 days of worsening shortness of breath associated with wheezing, with presenting chest x-ray showing findings consistent with acute viral bronchitis, in the absence of any evidence of infiltrate to suggest bacterial pneumonia.  I have placed an order for inpatient admission to med/tele for further evaluation management of the above.  I have placed some additional preliminary admit orders via the adult multi-morbid admission order set. I have also ordered Solu-Medrol, scheduled duo nebulizers, as needed albuterol nebulizers and added on magnesium and phosphorus levels.    Newton Pigg, DO Hospitalist

## 2023-02-25 NOTE — ED Provider Notes (Signed)
[place] EMERGENCY DEPARTMENT AT [facility] Provider Note  CSN: [id number] Arrival date & time: 02/25/23 0240  Chief Complaint(s) Shortness of Breath  HPI [name] [name] is a 71 y.o. male with PMH paroxysmal A-fib on Xarelto, CHF, CKD 3, T2DM, HTN, hypothyroidism, depression, anxiety, OSA, recurrent nephrolithiasis who presents emergency room for evaluation of shortness of breath.  Patient states that over the last 24 hours he has had flulike symptoms that have rapidly progressed to difficulty breathing.  Also endorses right-sided flank pain and concerned that he is actively having a kidney stone.  Patient arrives with significant tachypnea and wheezing.  Patient reported he took COVID test at home that was negative.  Denies associated chest pain, nausea, vomiting or other systemic symptoms.   Past Medical History Past Medical History:  Diagnosis Date   Arthritis    OA   Atrial flutter (HCC)    a. s/p RFCA 8/13   Cancer (HCC) 2010   prostate   Chronic combined systolic and diastolic CHF (congestive heart failure) (HCC)    a. LVEF previously 35% felt to be due tachycardia;  b. 02/2013 Echo: EF 50-55%, no rwma, mildly dil LA/RA, mild to mod MR. C 11/2014 echo - ef 60-65%, unable to determine DD   CKD (chronic kidney disease), stage III (HCC)    hx of a/c renal failure during episode of diverticulitis 9/13 in [place]   Diabetes mellitus (HCC)    TYPE 2    Diverticulitis    Diverticulosis    Dyslipidemia    Dysrhythmia    Erectile dysfunction    Gastric ulcer    s/p prior surgery   GERD (gastroesophageal reflux disease)    Headache    History of colonic diverticulitis    History of prostate cancer    Hypertension    Hypothyroid    Kidney stones    Microcytic anemia    Obesities, morbid (HCC)    Obstructive sleep apnea    noncompliant with CPAP   Osteoporosis    PAF (paroxysmal atrial fibrillation) (HCC)    a. failed DCCV and multiple anti-arrhythmic  drugs (multaq/flecainide);   b. s/p  PVI isolation with ablation of AFib 8/13; c. repeat PVI 01/2013;  d. 02/2013 repeat DCCV->Amio load/xarelto.   Peptic ulcer disease 1994   Renal calculi    Sleep apnea 2015   has not had sleep apnea since heart intervention   Thrombocytopenia (HCC) 1992   idopathic-treated with danazol   Tubular adenoma of colon    Patient Active Problem List   Diagnosis Date Noted   Iron deficiency anemia due to chronic blood loss 01/31/2022   Acute blood loss anemia    Acute upper GI bleeding 01/23/2022   Symptomatic anemia 01/23/2022   Arthritis of right wrist 02/25/2020   Diabetic polyneuropathy associated with type 2 diabetes mellitus (HCC) 03/17/2016   Hyperlipidemia associated with type 2 diabetes mellitus (HCC) 12/21/2015   Foot fracture, right 10/31/2015   S/P ablation of atrial flutter-2013 and Oct 2014 11/24/2014   Dyslipidemia 11/24/2014   Chronic anticoagulation-Xarelto 11/24/2014   Type 2 diabetes mellitus with stage 3 chronic kidney disease (HCC) 11/24/2014   Hypothyroidism 11/24/2014   Atrial fibrillation with RVR (HCC) 11/24/2014   AKI (acute kidney injury) (HCC)    Syncope 11/22/2014   Paroxysmal atrial fibrillation (HCC) 11/22/2014   Orthostatic hypotension 11/22/2014   Hyperkalemia 11/22/2014   Polycythemia 11/22/2014   Atrial flutter (HCC)    Acute bronchitis 08/08/2013  TIA (transient ischemic attack) 07/23/2013   Headache 07/23/2013   Acute on chronic combined systolic and diastolic congestive heart failure, NYHA class 1 (HCC) 03/06/2013   Chronic kidney disease, stage 3a (HCC) 03/06/2013   Chronic heart failure with preserved ejection fraction (HFpEF) (HCC)    CHF exacerbation (HCC) 03/04/2013   Flash pulmonary edema (HCC) 03/04/2013   Obstructive sleep apnea-C-pap intol 10/24/2011   Hypertension associated with diabetes (HCC) 10/24/2011   Obesity 10/24/2011   Home Medication(s) Prior to Admission medications   Medication Sig  Start Date End Date Taking? Authorizing Provider  acetaminophen (TYLENOL) 500 MG tablet Take 1,000 mg by mouth every 6 (six) hours as needed for moderate pain or headache.    [provider]  apixaban (ELIQUIS) 5 MG TABS tablet Take 1 tablet (5 mg total) by mouth 2 (two) times daily. 12/02/22   Chilton Si, MD  carvedilol (COREG) 12.5 MG tablet Take 3 tablets (37.5 mg total) by mouth 2 (two) times daily. 05/25/22   Chilton Si, MD  dronedarone (MULTAQ) 400 MG tablet Take 1 tablet (400 mg total) by mouth 2 (two) times daily with a meal. 03/24/22   Chilton Si, MD  Dulaglutide (TRULICITY) 1.5 MG/0.5ML SOPN Inject 1.5 mg into the skin once a week. 05/19/22     DULoxetine (CYMBALTA) 60 MG capsule Take 60 mg by mouth daily. 03/27/20   [provider]  FOLIC ACID PO Take 1,333 mcg by mouth daily.    [provider]  Glucosamine HCl 1500 MG TABS Take 1,500 mg by mouth in the morning and at bedtime.    [provider]  JARDIANCE 25 MG TABS tablet Take 25 mg by mouth daily. 06/12/19   [provider]  levothyroxine (SYNTHROID) 175 MCG tablet Take 175 mcg by mouth daily. 07/01/19   [provider]  losartan (COZAAR) 50 MG tablet Take 1 tablet (50 mg total) by mouth every evening. 05/26/22   Chilton Si, MD  Meclizine HCl (BONINE PO) Take 1 tablet by mouth daily as needed (nausea).    [provider]  metFORMIN (GLUCOPHAGE-XR) 500 MG 24 hr tablet Take 1,000 mg by mouth 2 (two) times daily. 07/15/19   [provider]  Multiple Vitamin (MULTIVITAMIN WITH MINERALS) TABS tablet Take 1 tablet by mouth daily. One-A-Day for Men    [provider]  Multiple Vitamins-Minerals (PRESERVISION AREDS 2) CAPS Take 1 capsule by mouth 2 (two) times daily.    [provider]  omeprazole (PRILOSEC) 40 MG capsule Take 40 mg by mouth daily.    [provider]  polycarbophil (FIBERCON) 625 MG tablet Take 1,250 mg by  mouth every evening.    [provider]  Probiotic Product (PROBIOTIC PO) Take 1 capsule by mouth at bedtime.    [provider]  rosuvastatin (CRESTOR) 10 MG tablet Take 1 tablet (10 mg total) by mouth daily. 03/15/22   Chilton Si, MD  TRULICITY 0.75 MG/0.5ML SOPN Inject 1.5 mg into the skin once a week. Tuesdays 06/22/21   [provider]  vitamin B-12 (CYANOCOBALAMIN) 1000 MCG tablet Take 1,000 mcg by mouth daily.    [provider]  Vitamin D, Ergocalciferol, (DRISDOL) 1.25 MG (50000 UNIT) CAPS capsule Take 50,000 Units by mouth every 7 (seven) days. Tuesdays 01/04/22   [provider]  Past Surgical History Past Surgical History:  Procedure Laterality Date   ATRIAL FIBRILLATION ABLATION  12/06/11; 02/05/2013   Afib and atrial flutter ablation by Dr Johney Frame; repeat PVI by Dr Johney Frame 02/05/2013   ATRIAL FIBRILLATION ABLATION N/A 12/06/2011   Procedure: ATRIAL FIBRILLATION ABLATION;  Surgeon: Hillis Range, MD;  Location: [facility] CATH LAB;  Service: Cardiovascular;  Laterality: N/A;   ATRIAL FIBRILLATION ABLATION N/A 02/05/2013   Procedure: ATRIAL FIBRILLATION ABLATION;  Surgeon: Gardiner Rhyme, MD;  Location: [facility] CATH LAB;  Service: Cardiovascular;  Laterality: N/A;   benign stomach tumor removal  2000   CARDIOVERSION  08/25/2011   Procedure: CARDIOVERSION;  Surgeon: Corky Crafts, MD;  Location: [facility] ENDOSCOPY;  Service: Cardiovascular;  Laterality: N/A;   CARDIOVERSION N/A 03/05/2013   Procedure: CARDIOVERSION;  Surgeon: Lesleigh Noe, MD;  Location: [facility] OR;  Service: Cardiovascular;  Laterality: N/A;  BEDSIDE    CARDIOVERSION N/A 03/25/2020   Procedure: CARDIOVERSION;  Surgeon: Chilton Si, MD;  Location: [facility] ENDOSCOPY;  Service: Cardiovascular;  Laterality: N/A;   CARDIOVERSION N/A 06/02/2020   Procedure:  CARDIOVERSION;  Surgeon: Chrystie Nose, MD;  Location: [facility] ENDOSCOPY;  Service: Cardiovascular;  Laterality: N/A;   CARPAL TUNNEL WITH CUBITAL TUNNEL Right 02/25/2020   Procedure: RIGHT CARPAL TUNNEL RELEASE, CUBITAL TUNNEL RELEASE IN SITU, RIGHT WRIST PROXIMAL ROW CARPECTOMY AND PROXIMAL CAPITATE HEAD REPLACEMENT/HEMIARTHROPLASTY WITH POSTERIOR INTEROSSOUS NERVE NEURECTOMY AND REPAIR;  Surgeon: Dominica Severin, MD;  Location: [facility] OR;  Service: Orthopedics;  Laterality: Right;  2.5 hrs Block with IV Sedation   convergent afib abaltion [facility] 02/26/15  02/26/2015   [facility] by Dr. Hurman Horn and Dr. Sindy Guadeloupe   ELECTROPHYSIOLOGIC STUDY N/A 11/24/2014   Procedure: Cardioversion;  Surgeon: Will Jorja Loa, MD;  Location: [facility] INVASIVE CV LAB;  Service: Cardiovascular;  Laterality: N/A;   ESOPHAGOGASTRODUODENOSCOPY N/A 04/09/2013   Procedure: ESOPHAGOGASTRODUODENOSCOPY (EGD) with possible Balloon Dilation;  Surgeon: Charolett Bumpers, MD;  Location: [facility] ENDOSCOPY;  Service: Endoscopy;  Laterality: N/A;   ESOPHAGOGASTRODUODENOSCOPY (EGD) WITH PROPOFOL N/A 01/24/2022   Procedure: ESOPHAGOGASTRODUODENOSCOPY (EGD) WITH PROPOFOL;  Surgeon: Sherrilyn Rist, MD;  Location: [facility] ENDOSCOPY;  Service: Gastroenterology;  Laterality: N/A;   HEMOSTASIS CLIP PLACEMENT  01/24/2022   Procedure: HEMOSTASIS CLIP PLACEMENT;  Surgeon: Sherrilyn Rist, MD;  Location: [facility] ENDOSCOPY;  Service: Gastroenterology;;   kidney stone removal     multiple   KNEE ARTHROSCOPY Bilateral 1979   LAPAROSCOPIC RETROPUBIC PROSTATECTOMY  02/2007   hx prostate cancer   ORIF ANKLE FRACTURE Right 10/2015   ORIF ANKLE FRACTURE Right 11/03/2015   Procedure: ORIF RIGHT LISFRANK FOOT;  Surgeon: Nadara Mustard, MD;  Location: [facility] OR;  Service: Orthopedics;  Laterality: Right;   SUBMUCOSAL INJECTION  01/24/2022   Procedure: SUBMUCOSAL INJECTION;  Surgeon: Sherrilyn Rist, MD;  Location: [facility] ENDOSCOPY;  Service: Gastroenterology;;   TEE WITHOUT  CARDIOVERSION  08/25/2011   Procedure: TRANSESOPHAGEAL ECHOCARDIOGRAM (TEE);  Surgeon: Corky Crafts, MD;  Location: [facility] ENDOSCOPY;  Service: Cardiovascular;  Laterality: N/A;   TEE WITHOUT CARDIOVERSION  11/09/2011   Procedure: TRANSESOPHAGEAL ECHOCARDIOGRAM (TEE);  Surgeon: Corky Crafts, MD;  Location: [facility] ENDOSCOPY;  Service: Cardiovascular;  Laterality: N/A;  h/p in file drawer   TEE WITHOUT CARDIOVERSION N/A 02/05/2013   Procedure: TRANSESOPHAGEAL ECHOCARDIOGRAM (TEE);  Surgeon: Donato Schultz, MD;  Location: [facility] ENDOSCOPY;  Service: Cardiovascular;  Laterality: N/A;   TEE WITHOUT CARDIOVERSION N/A 03/25/2020   Procedure: TRANSESOPHAGEAL ECHOCARDIOGRAM (TEE);  Surgeon: Chilton Si, MD;  Location: [facility] ENDOSCOPY;  Service: Cardiovascular;  Laterality: N/A;   TOTAL THYROIDECTOMY  2009   benign nodules removed   UPPER GASTROINTESTINAL ENDOSCOPY     WRIST SURGERY Right 1977   with placement of lunate prosthesis   Family History Family History  Problem Relation Age of Onset   Emphysema Mother    Lung cancer Father    Diabetes Brother    Atrial fibrillation Brother    Colon polyps Brother    Throat cancer Sister    Colon polyps Sister    Lung cancer Sister    Colon polyps Sister    Colon cancer Neg Hx    Esophageal cancer Neg Hx    Rectal cancer Neg Hx    Stomach cancer Neg Hx     Social History Social History   Tobacco Use   Smoking status: Never   Smokeless tobacco: Never  Vaping Use   Vaping status: Never Used  Substance Use Topics   Alcohol use: No   Drug use: No   Allergies Bystolic [nebivolol hcl], Calcium channel blockers, Feraheme [ferumoxytol], Phenergan [promethazine hcl], Beta adrenergic blockers, Cardizem [diltiazem], Norvasc [amlodipine], Aspirin, Nsaids, and Prednisone  Review of Systems Review of Systems  Respiratory:  Positive for cough and shortness of breath.   Gastrointestinal:  Positive for abdominal pain.  Genitourinary:  Positive  for flank pain.   Physical Exam Vital Signs  I have reviewed the triage vital signs BP (!) 153/76   Pulse 63   Temp 99.7 F (37.6 C) (Oral)   Resp 16   SpO2 94%   Physical Exam Constitutional:      General: He is not in acute distress.    Appearance: Normal appearance.  HENT:     Head: Normocephalic and atraumatic.     Nose: No congestion or rhinorrhea.  Eyes:     General:        Right eye: No discharge.        Left eye: No discharge.     Extraocular Movements: Extraocular movements intact.     Pupils: Pupils are equal, round, and reactive to light.  Cardiovascular:     Rate and Rhythm: Normal rate and regular rhythm.     Heart sounds: No murmur heard. Pulmonary:     Effort: Tachypnea, accessory muscle usage and respiratory distress present.     Breath sounds: Wheezing present. No rales.  Abdominal:     General: There is no distension.     Tenderness: There is abdominal tenderness.  Musculoskeletal:        General: Normal range of motion.     Cervical back: Normal range of motion.  Skin:    General: Skin is warm and dry.  Neurological:     General: No focal deficit present.     Mental Status: He is alert.     ED Results and Treatments Labs (all labs ordered are listed, but only abnormal results are displayed) Labs Reviewed  COMPREHENSIVE METABOLIC PANEL - Abnormal; Notable for the following components:      Result Value   Sodium 133 (*)    Potassium 3.4 (*)    CO2 20 (*)    Glucose, Bld 131 (*)    Creatinine, Ser 1.43 (*)    Calcium 8.3 (*)    GFR, Estimated 52 (*)    All other components within normal limits  CBC WITH DIFFERENTIAL/PLATELET - Abnormal; Notable for the following components:   RBC 4.14 (*)    Hemoglobin 12.5 (*)  All other components within normal limits  BRAIN NATRIURETIC PEPTIDE - Abnormal; Notable for the following components:   B Natriuretic Peptide 161.3 (*)    All other components within normal limits  BLOOD GAS, VENOUS -  Abnormal; Notable for the following components:   pCO2, Ven 38 (*)    All other components within normal limits  TROPONIN I (HIGH SENSITIVITY) - Abnormal; Notable for the following components:   Troponin I (High Sensitivity) 19 (*)    All other components within normal limits  SARS CORONAVIRUS 2 BY RT PCR  URINALYSIS, ROUTINE W REFLEX MICROSCOPIC  TROPONIN I (HIGH SENSITIVITY)                                                                                                                          Radiology DG Chest 1 View  Result Date: 02/25/2023 CLINICAL DATA:  Flu like symptoms for 1 day EXAM: PORTABLE CHEST 1 VIEW COMPARISON:  10/31/2015 FINDINGS: Cardiac shadow is stable. The lungs are well aerated bilaterally. Mild peribronchial cuffing is noted without focal confluent infiltrate. This likely represents a viral etiology. No bony abnormality is noted. IMPRESSION: Changes likely related to a viral bronchitis Electronically Signed   By: Alcide Clever M.D.   On: 02/25/2023 03:09    Pertinent labs & imaging results that were available during my care of the patient were reviewed by me and considered in my medical decision making (see MDM for details).  Medications Ordered in ED Medications  ipratropium-albuterol (DUONEB) 0.5-2.5 (3) MG/3ML nebulizer solution 9 mL (9 mLs Nebulization Given 02/25/23 0311)  albuterol (PROVENTIL) (2.5 MG/3ML) 0.083% nebulizer solution (10 mg/hr Nebulization Given 02/25/23 0434)                                                                                                                                     Procedures .Critical Care  Performed by: Glendora Score, MD Authorized by: Glendora Score, MD   Critical care provider statement:    Critical care time (minutes):  30   Critical care was necessary to treat or prevent imminent or life-threatening deterioration of the following conditions:  Respiratory failure   Critical care was time spent personally by  me on the following activities:  Development of treatment plan with patient or surrogate, discussions with consultants, evaluation of patient's response to treatment, examination of patient, ordering and review of laboratory studies, ordering  and review of radiographic studies, ordering and performing treatments and interventions, pulse oximetry, re-evaluation of patient's condition and review of old charts   (including critical care time)  Medical Decision Making / ED Course   This patient presents to the ED for concern of shortness of breath, flank pain, this involves an extensive number of treatment options, and is a complaint that carries with it a high risk of complications and morbidity.  The differential diagnosis includes Pe, PTX, Pulmonary Edema, ARDS, COPD/Asthma, ACS, CHF exacerbation, Arrhythmia, Pericardial Effusion/Tamponade, Anemia, Sepsis, Acidosis/Hypercapnia, Anxiety, Viral URI, nephrolithiasis, pyelonephritis, obstruction, AAA, musculoskeletal strain, vertebral fracture, intra-abdominal abscess, diverticulitis  MDM: Patient seen emergency room for evaluation of flank pain and shortness of breath.  Physical exam with respiratory distress, wheezing bilaterally and accessory muscle use.  Mild right CVA tenderness to palpation.  Laboratory evaluation with hemoglobin 12.5, potassium 3.4 which was repleted in the ER, creatinine 1.43, pH 7.41 with no significant hypercarbia, BNP 161.3, urinalysis unremarkable.  Chest x-ray with bronchitis.  CT stone study without evidence of obstructive stone.  Patient received 3 DuoNebs and on reevaluation respiratory distress starting to improve but wheezing is persistent.  He received an additional 10 mg CAT and unfortunately wheezing is persistent.  Steroids administered and he will require hospital admission for persistent shortness of breath and wheezing despite aggressive ER interventions.  Patient admitted   Additional history obtained: -Additional  history obtained from wife -External records from outside source obtained and reviewed including: Chart review including previous notes, labs, imaging, consultation notes   Lab Tests: -I ordered, reviewed, and interpreted labs.   The pertinent results include:   Labs Reviewed  COMPREHENSIVE METABOLIC PANEL - Abnormal; Notable for the following components:      Result Value   Sodium 133 (*)    Potassium 3.4 (*)    CO2 20 (*)    Glucose, Bld 131 (*)    Creatinine, Ser 1.43 (*)    Calcium 8.3 (*)    GFR, Estimated 52 (*)    All other components within normal limits  CBC WITH DIFFERENTIAL/PLATELET - Abnormal; Notable for the following components:   RBC 4.14 (*)    Hemoglobin 12.5 (*)    All other components within normal limits  BRAIN NATRIURETIC PEPTIDE - Abnormal; Notable for the following components:   B Natriuretic Peptide 161.3 (*)    All other components within normal limits  BLOOD GAS, VENOUS - Abnormal; Notable for the following components:   pCO2, Ven 38 (*)    All other components within normal limits  TROPONIN I (HIGH SENSITIVITY) - Abnormal; Notable for the following components:   Troponin I (High Sensitivity) 19 (*)    All other components within normal limits  SARS CORONAVIRUS 2 BY RT PCR  URINALYSIS, ROUTINE W REFLEX MICROSCOPIC  TROPONIN I (HIGH SENSITIVITY)      EKG   EKG Interpretation Date/Time:  Saturday February 25 2023 02:49:11 EST Ventricular Rate:  74 PR Interval:  205 QRS Duration:  111 QT Interval:  414 QTC Calculation: 460 R Axis:   -58  Text Interpretation: Sinus rhythm Ventricular premature complex Incomplete left bundle branch block LVH with secondary repolarization abnormality No significant change since last tracing Confirmed by Tarquin Welcher ([id number]) on 02/25/2023 3:06:46 AM         Imaging Studies ordered: I ordered imaging studies including chest x-ray, CT stone study I independently visualized and interpreted imaging. I agree  with the radiologist interpretation   Medicines ordered and  prescription drug management: Meds ordered this encounter  Medications   ipratropium-albuterol (DUONEB) 0.5-2.5 (3) MG/3ML nebulizer solution 9 mL   albuterol (PROVENTIL) (2.5 MG/3ML) 0.083% nebulizer solution    -I have reviewed the patients home medicines and have made adjustments as needed  Critical interventions Multiple DuoNebs, steroids    Cardiac Monitoring: The patient was maintained on a cardiac monitor.  I personally viewed and interpreted the cardiac monitored which showed an underlying rhythm of: NSR  Social Determinants of Health:  Factors impacting patients care include: none   Reevaluation: After the interventions noted above, I reevaluated the patient and found that they have :improved  Co morbidities that complicate the patient evaluation  Past Medical History:  Diagnosis Date   Arthritis    OA   Atrial flutter (HCC)    a. s/p RFCA 8/13   Cancer (HCC) 2010   prostate   Chronic combined systolic and diastolic CHF (congestive heart failure) (HCC)    a. LVEF previously 35% felt to be due tachycardia;  b. 02/2013 Echo: EF 50-55%, no rwma, mildly dil LA/RA, mild to mod MR. C 11/2014 echo - ef 60-65%, unable to determine DD   CKD (chronic kidney disease), stage III (HCC)    hx of a/c renal failure during episode of diverticulitis 9/13 in [place], [place]   Diabetes mellitus (HCC)    TYPE 2    Diverticulitis    Diverticulosis    Dyslipidemia    Dysrhythmia    Erectile dysfunction    Gastric ulcer    s/p prior surgery   GERD (gastroesophageal reflux disease)    Headache    History of colonic diverticulitis    History of prostate cancer    Hypertension    Hypothyroid    Kidney stones    Microcytic anemia    Obesities, morbid (HCC)    Obstructive sleep apnea    noncompliant with CPAP   Osteoporosis    PAF (paroxysmal atrial fibrillation) (HCC)    a. failed DCCV and multiple anti-arrhythmic  drugs (multaq/flecainide);   b. s/p  PVI isolation with ablation of AFib 8/13; c. repeat PVI 01/2013;  d. 02/2013 repeat DCCV->Amio load/xarelto.   Peptic ulcer disease 1994   Renal calculi    Sleep apnea 2015   has not had sleep apnea since heart intervention   Thrombocytopenia (HCC) 1992   idopathic-treated with danazol   Tubular adenoma of colon       Dispostion: I considered admission for this patient, and given persistent shortness of breath and persistent wheezing he will require hospital admission     Final Clinical Impression(s) / ED Diagnoses Final diagnoses:  None     @PCDICTATION @    Glendora Score, MD 02/25/23 [phone]

## 2023-02-25 NOTE — ED Notes (Signed)
ED TO INPATIENT HANDOFF REPORT  ED Nurse Name and Phone #:   S Name/Age/Gender [name] [name] 72 y.o. male Room/Bed: WA08/WA08  Code Status   Code Status: Full Code  Home/SNF/Other Home Patient oriented to: self, place, time, and situation Is this baseline? Yes   Triage Complete: Triage complete  Chief Complaint Acute bronchitis with bronchospasm [J20.9]  Triage Note Pt to ED by pov from home with c/o SOB and cough. Pt states he woke up yesterday morning with a cold and that it has gotten worse throughout the past 24 hours. Pt also endorses currently having a kidney stone.Pt noted to have labored breathing throughout triage. Arrives A+O, VSS. At home covid test was negative.   Allergies Allergies  Allergen Reactions   Bystolic [Nebivolol Hcl] Swelling and Other (See Comments)    Bradycardia    Calcium Channel Blockers Swelling and Other (See Comments)    LE edema   Feraheme [Ferumoxytol] Shortness Of Breath, Nausea And Vomiting and Other (See Comments)    Chest pain   Phenergan [Promethazine Hcl] Itching and Other (See Comments)    Hallucination    Beta Adrenergic Blockers Swelling and Other (See Comments)    bradycardia   Cardizem [Diltiazem] Other (See Comments)    Foot swelling and increased his heart rate   Norvasc [Amlodipine] Other (See Comments)    Unknown reaction   Aspirin Other (See Comments)    Hx of ulcer    Nsaids Other (See Comments)    Hx ulcer   Prednisone Other (See Comments)    Hx of ulcer    Level of Care/Admitting Diagnosis ED Disposition     ED Disposition  Admit   Condition  --   Comment  Hospital Area: [facility] [[id number]]  Level of Care: Telemetry [5]  Admit to tele based on following criteria: Monitor for Ischemic changes  May admit patient to [facility] or [facility] if equivalent level of care is available:: No  Covid Evaluation: Asymptomatic - no recent exposure (last 10 days) testing not required   Diagnosis: Acute bronchitis with bronchospasm [161096]  Admitting Physician: Angie Fava [[id number]]  Attending Physician: Angie Fava [[id number]]  Certification:: I certify this patient will need inpatient services for at least 2 midnights  Expected Medical Readiness: 02/27/2023          B Medical/Surgery History Past Medical History:  Diagnosis Date   Arthritis    OA   Atrial flutter (HCC)    a. s/p RFCA 8/13   Cancer (HCC) 2010   prostate   Chronic combined systolic and diastolic CHF (congestive heart failure) (HCC)    a. LVEF previously 35% felt to be due tachycardia;  b. 02/2013 Echo: EF 50-55%, no rwma, mildly dil LA/RA, mild to mod MR. C 11/2014 echo - ef 60-65%, unable to determine DD   CKD (chronic kidney disease), stage III (HCC)    hx of a/c renal failure during episode of diverticulitis 9/13 in [place], [place]   Diabetes mellitus (HCC)    TYPE 2    Diverticulitis    Diverticulosis    Dyslipidemia    Dysrhythmia    Erectile dysfunction    Gastric ulcer    s/p prior surgery   GERD (gastroesophageal reflux disease)    Headache    History of colonic diverticulitis    History of prostate cancer    Hypertension    Hypothyroid    Kidney stones    Microcytic anemia  Obesities, morbid (HCC)    Obstructive sleep apnea    noncompliant with CPAP   Osteoporosis    PAF (paroxysmal atrial fibrillation) (HCC)    a. failed DCCV and multiple anti-arrhythmic drugs (multaq/flecainide);   b. s/p  PVI isolation with ablation of AFib 8/13; c. repeat PVI 01/2013;  d. 02/2013 repeat DCCV->Amio load/xarelto.   Peptic ulcer disease 1994   Renal calculi    Sleep apnea 2015   has not had sleep apnea since heart intervention   Thrombocytopenia (HCC) 1992   idopathic-treated with danazol   Tubular adenoma of colon    Past Surgical History:  Procedure Laterality Date   ATRIAL FIBRILLATION ABLATION  12/06/11; 02/05/2013   Afib and atrial flutter ablation by Dr Johney Frame;  repeat PVI by Dr Johney Frame 02/05/2013   ATRIAL FIBRILLATION ABLATION N/A 12/06/2011   Procedure: ATRIAL FIBRILLATION ABLATION;  Surgeon: Hillis Range, MD;  Location: [facility] CATH LAB;  Service: Cardiovascular;  Laterality: N/A;   ATRIAL FIBRILLATION ABLATION N/A 02/05/2013   Procedure: ATRIAL FIBRILLATION ABLATION;  Surgeon: Gardiner Rhyme, MD;  Location: [facility] CATH LAB;  Service: Cardiovascular;  Laterality: N/A;   benign stomach tumor removal  2000   CARDIOVERSION  08/25/2011   Procedure: CARDIOVERSION;  Surgeon: Corky Crafts, MD;  Location: [facility] ENDOSCOPY;  Service: Cardiovascular;  Laterality: N/A;   CARDIOVERSION N/A 03/05/2013   Procedure: CARDIOVERSION;  Surgeon: Lesleigh Noe, MD;  Location: [facility] OR;  Service: Cardiovascular;  Laterality: N/A;  BEDSIDE    CARDIOVERSION N/A 03/25/2020   Procedure: CARDIOVERSION;  Surgeon: Chilton Si, MD;  Location: [facility] ENDOSCOPY;  Service: Cardiovascular;  Laterality: N/A;   CARDIOVERSION N/A 06/02/2020   Procedure: CARDIOVERSION;  Surgeon: Chrystie Nose, MD;  Location: [facility] ENDOSCOPY;  Service: Cardiovascular;  Laterality: N/A;   CARPAL TUNNEL WITH CUBITAL TUNNEL Right 02/25/2020   Procedure: RIGHT CARPAL TUNNEL RELEASE, CUBITAL TUNNEL RELEASE IN SITU, RIGHT WRIST PROXIMAL ROW CARPECTOMY AND PROXIMAL CAPITATE HEAD REPLACEMENT/HEMIARTHROPLASTY WITH POSTERIOR INTEROSSOUS NERVE NEURECTOMY AND REPAIR;  Surgeon: Dominica Severin, MD;  Location: [facility] OR;  Service: Orthopedics;  Laterality: Right;  2.5 hrs Block with IV Sedation   convergent afib abaltion [facility] 02/26/15  02/26/2015   [facility] by Dr. Hurman Horn and Dr. Sindy Guadeloupe   ELECTROPHYSIOLOGIC STUDY N/A 11/24/2014   Procedure: Cardioversion;  Surgeon: Will Jorja Loa, MD;  Location: [facility] INVASIVE CV LAB;  Service: Cardiovascular;  Laterality: N/A;   ESOPHAGOGASTRODUODENOSCOPY N/A 04/09/2013   Procedure: ESOPHAGOGASTRODUODENOSCOPY (EGD) with possible Balloon Dilation;  Surgeon: Charolett Bumpers, MD;  Location:  [facility] ENDOSCOPY;  Service: Endoscopy;  Laterality: N/A;   ESOPHAGOGASTRODUODENOSCOPY (EGD) WITH PROPOFOL N/A 01/24/2022   Procedure: ESOPHAGOGASTRODUODENOSCOPY (EGD) WITH PROPOFOL;  Surgeon: Sherrilyn Rist, MD;  Location: [facility] ENDOSCOPY;  Service: Gastroenterology;  Laterality: N/A;   HEMOSTASIS CLIP PLACEMENT  01/24/2022   Procedure: HEMOSTASIS CLIP PLACEMENT;  Surgeon: Sherrilyn Rist, MD;  Location: [facility] ENDOSCOPY;  Service: Gastroenterology;;   kidney stone removal     multiple   KNEE ARTHROSCOPY Bilateral 1979   LAPAROSCOPIC RETROPUBIC PROSTATECTOMY  02/2007   hx prostate cancer   ORIF ANKLE FRACTURE Right 10/2015   ORIF ANKLE FRACTURE Right 11/03/2015   Procedure: ORIF RIGHT LISFRANK FOOT;  Surgeon: Nadara Mustard, MD;  Location: [facility] OR;  Service: Orthopedics;  Laterality: Right;   SUBMUCOSAL INJECTION  01/24/2022   Procedure: SUBMUCOSAL INJECTION;  Surgeon: Sherrilyn Rist, MD;  Location: [facility] ENDOSCOPY;  Service: Gastroenterology;;   TEE WITHOUT CARDIOVERSION  08/25/2011  Procedure: TRANSESOPHAGEAL ECHOCARDIOGRAM (TEE);  Surgeon: Corky Crafts, MD;  Location: [facility] ENDOSCOPY;  Service: Cardiovascular;  Laterality: N/A;   TEE WITHOUT CARDIOVERSION  11/09/2011   Procedure: TRANSESOPHAGEAL ECHOCARDIOGRAM (TEE);  Surgeon: Corky Crafts, MD;  Location: [facility] ENDOSCOPY;  Service: Cardiovascular;  Laterality: N/A;  h/p in file drawer   TEE WITHOUT CARDIOVERSION N/A 02/05/2013   Procedure: TRANSESOPHAGEAL ECHOCARDIOGRAM (TEE);  Surgeon: Donato Schultz, MD;  Location: [facility] ENDOSCOPY;  Service: Cardiovascular;  Laterality: N/A;   TEE WITHOUT CARDIOVERSION N/A 03/25/2020   Procedure: TRANSESOPHAGEAL ECHOCARDIOGRAM (TEE);  Surgeon: Chilton Si, MD;  Location: [facility] ENDOSCOPY;  Service: Cardiovascular;  Laterality: N/A;   TOTAL THYROIDECTOMY  2009   benign nodules removed   UPPER GASTROINTESTINAL ENDOSCOPY     WRIST SURGERY Right 1977   with placement of lunate prosthesis     A IV  Location/Drains/Wounds Patient Lines/Drains/Airways Status     Active Line/Drains/Airways     Name Placement date Placement time Site Days   Peripheral IV 02/25/23 20 G 1.88" Left Antecubital 02/25/23  0342  Antecubital  less than 1            Intake/Output Last 24 hours No intake or output data in the 24 hours ending 02/25/23 1408  Labs/Imaging Results for orders placed or performed during the hospital encounter of 02/25/23 (from the past 48 hour(s))  Comprehensive metabolic panel     Status: Abnormal   Collection Time: 02/25/23  2:51 AM  Result Value Ref Range   Sodium 133 (L) 135 - 145 mmol/L   Potassium 3.4 (L) 3.5 - 5.1 mmol/L   Chloride 105 98 - 111 mmol/L   CO2 20 (L) 22 - 32 mmol/L   Glucose, Bld 131 (H) 70 - 99 mg/dL    Comment: Glucose reference range applies only to samples taken after fasting for at least 8 hours.   BUN 16 8 - 23 mg/dL   Creatinine, Ser 1.61 (H) 0.61 - 1.24 mg/dL   Calcium 8.3 (L) 8.9 - 10.3 mg/dL   Total Protein 6.8 6.5 - 8.1 g/dL   Albumin 3.8 3.5 - 5.0 g/dL   AST 20 15 - 41 U/L   ALT 22 0 - 44 U/L   Alkaline Phosphatase 73 38 - 126 U/L   Total Bilirubin 0.7 <1.2 mg/dL   GFR, Estimated 52 (L) >60 mL/min    Comment: (NOTE) Calculated using the CKD-EPI Creatinine Equation (2021)    Anion gap 8 5 - 15    Comment: Performed at [facility], [address], [place], [place] [address]  Troponin I (High Sensitivity)     Status: Abnormal   Collection Time: 02/25/23  2:51 AM  Result Value Ref Range   Troponin I (High Sensitivity) 19 (H) <18 ng/L    Comment: (NOTE) Elevated high sensitivity troponin I (hsTnI) values and significant  changes across serial measurements may suggest ACS but many other  chronic and acute conditions are known to elevate hsTnI results.  Refer to the "Links" section for chest pain algorithms and additional  guidance. Performed at [facility], [address] [place], [place]  [address]   CBC with Differential     Status: Abnormal   Collection Time: 02/25/23  2:51 AM  Result Value Ref Range   WBC 9.9 4.0 - 10.5 K/uL   RBC 4.14 (L) 4.22 - 5.81 MIL/uL   Hemoglobin 12.5 (L) 13.0 - 17.0 g/dL   HCT 11.9 14.7 - 82.9 %  MCV 95.9 80.0 - 100.0 fL   MCH 30.2 26.0 - 34.0 pg   MCHC 31.5 30.0 - 36.0 g/dL   RDW 09.8 11.9 - 14.7 %   Platelets 207 150 - 400 K/uL   nRBC 0.0 0.0 - 0.2 %   Neutrophils Relative % 74 %   Neutro Abs 7.3 1.7 - 7.7 K/uL   Lymphocytes Relative 15 %   Lymphs Abs 1.5 0.7 - 4.0 K/uL   Monocytes Relative 8 %   Monocytes Absolute 0.8 0.1 - 1.0 K/uL   Eosinophils Relative 2 %   Eosinophils Absolute 0.2 0.0 - 0.5 K/uL   Basophils Relative 0 %   Basophils Absolute 0.0 0.0 - 0.1 K/uL   Immature Granulocytes 1 %   Abs Immature Granulocytes 0.07 0.00 - 0.07 K/uL    Comment: Performed at [facility], [address], [place], [place] [address]  SARS Coronavirus 2 by RT PCR (hospital order, performed in [facility] hospital lab) *cepheid single result test* Anterior Nasal Swab     Status: None   Collection Time: 02/25/23  2:51 AM   Specimen: Anterior Nasal Swab  Result Value Ref Range   SARS Coronavirus 2 by RT PCR NEGATIVE NEGATIVE    Comment: (NOTE) SARS-CoV-2 target nucleic acids are NOT DETECTED.  The SARS-CoV-2 RNA is generally detectable in upper and lower respiratory specimens during the acute phase of infection. The lowest concentration of SARS-CoV-2 viral copies this assay can detect is 250 copies / mL. A negative result does not preclude SARS-CoV-2 infection and should not be used as the sole basis for treatment or other patient management decisions.  A negative result may occur with improper specimen collection / handling, submission of specimen other than nasopharyngeal swab, presence of viral mutation(s) within the areas targeted by this assay, and inadequate number of viral copies (<250 copies / mL). A negative result  must be combined with clinical observations, patient history, and epidemiological information.  Fact Sheet for Patients:   RoadLapTop.co.za  Fact Sheet for Healthcare Providers: http://kim-miller.com/  This test is not yet approved or  cleared by the [place] FDA and has been authorized for detection and/or diagnosis of SARS-CoV-2 by FDA under an Emergency Use Authorization (EUA).  This EUA will remain in effect (meaning this test can be used) for the duration of the COVID-19 declaration under Section 564(b)(1) of the Act, 21 U.S.C. section 360bbb-3(b)(1), unless the authorization is terminated or revoked sooner.  Performed at [facility], [address] [place], [place] [address]   Brain natriuretic peptide     Status: Abnormal   Collection Time: 02/25/23  3:30 AM  Result Value Ref Range   B Natriuretic Peptide 161.3 (H) 0.0 - 100.0 pg/mL    Comment: Performed at [facility], [address] [place], [place] [address]  Blood gas, venous (at [facility])     Status: Abnormal   Collection Time: 02/25/23  3:46 AM  Result Value Ref Range   pH, Ven 7.41 7.25 - 7.43   pCO2, Ven 38 (L) 44 - 60 mmHg   pO2, Ven 32 32 - 45 mmHg   Bicarbonate 24.2 20.0 - 28.0 mmol/L   Acid-base deficit 0.5 0.0 - 2.0 mmol/L   O2 Saturation 53.1 %   Patient temperature 36.1     Comment: Performed at [facility], [address], [place], [place] [address]  Troponin I (High Sensitivity)     Status:  None   Collection Time: 02/25/23  4:59 AM  Result Value Ref Range   Troponin I (High Sensitivity) 17 <18 ng/L    Comment: (NOTE) Elevated high sensitivity troponin I (hsTnI) values and significant  changes across serial measurements may suggest ACS but many other  chronic and acute conditions are known to elevate hsTnI results.  Refer to the "Links" section for chest pain algorithms and additional   guidance. Performed at [facility] [address] [place], [place] [address]   Magnesium     Status: None   Collection Time: 02/25/23  4:59 AM  Result Value Ref Range   Magnesium 2.0 1.7 - 2.4 mg/dL    Comment: Performed at [facility], [address], [place], [place] [address]  Phosphorus     Status: None   Collection Time: 02/25/23  4:59 AM  Result Value Ref Range   Phosphorus 2.8 2.5 - 4.6 mg/dL    Comment: Performed at [facility], [address] [place], [place] [address]  Urinalysis, Routine w reflex microscopic -Urine, Clean Catch     Status: Abnormal   Collection Time: 02/25/23  5:18 AM  Result Value Ref Range   Color, Urine YELLOW YELLOW   APPearance CLEAR CLEAR   Specific Gravity, Urine 1.028 1.005 - 1.030   pH 5.0 5.0 - 8.0   Glucose, UA >=500 (A) NEGATIVE mg/dL   Hgb urine dipstick NEGATIVE NEGATIVE   Bilirubin Urine NEGATIVE NEGATIVE   Ketones, ur NEGATIVE NEGATIVE mg/dL   Protein, ur 30 (A) NEGATIVE mg/dL   Nitrite NEGATIVE NEGATIVE   Leukocytes,Ua NEGATIVE NEGATIVE   RBC / HPF 0-5 0 - 5 RBC/hpf   WBC, UA 0-5 0 - 5 WBC/hpf   Bacteria, UA NONE SEEN NONE SEEN   Squamous Epithelial / HPF 0-5 0 - 5 /HPF   Mucus PRESENT     Comment: Performed at [facility], [address], [place], [place] [address]  CBG monitoring, ED     Status: Abnormal   Collection Time: 02/25/23 12:30 PM  Result Value Ref Range   Glucose-Capillary 180 (H) 70 - 99 mg/dL    Comment: Glucose reference range applies only to samples taken after fasting for at least 8 hours.   CT Renal Stone Study  Result Date: 02/25/2023 CLINICAL DATA:  Abdominal/flank pain with stone suspected EXAM: CT ABDOMEN AND PELVIS WITHOUT CONTRAST TECHNIQUE: Multidetector CT imaging of the abdomen and pelvis was performed following the standard protocol without IV contrast. RADIATION DOSE REDUCTION: This exam was performed according to  the departmental dose-optimization program which includes automated exposure control, adjustment of the mA and/or kV according to patient size and/or use of iterative reconstruction technique. COMPARISON:  09/05/2021 FINDINGS: Lower chest: Symmetric ground-glass and reticular density in the lower lobes. Hepatobiliary: No focal liver abnormality.Small calculi at the gallbladder neck. No evidence of acute cholecystitis. Pancreas: Generalized fatty atrophy. Spleen: Unremarkable. Adrenals/Urinary Tract: Negative adrenals. No hydronephrosis or ureteral stone. Small left upper pole renal cystic density. Collapsed urinary bladder. A small portion of the bladder herniates into the right inguinal canal. Lobulated stone at the lower left kidney measuring 9 mm. Punctate right lower pole calculus. A larger previously seen right lower pole stone is no longer visualized. Stomach/Bowel: No obstruction. Distal colonic diverticulosis. No evidence of bowel inflammation. Vascular/Lymphatic: Atheromatous calcification of the aorta and iliacs. No mass or adenopathy. Reproductive:Prostatectomy. Other: No ascites or pneumoperitoneum. Musculoskeletal: No acute abnormalities. Small geographic areas of  sclerosis in the femoral heads, there may be mild chronic avascular necrosis. Ordinary lumbar spine degeneration. IMPRESSION: 1. No acute intra-abdominal finding. No hydronephrosis or ureteral calculus. 2. Pulmonary density at the lung bases is symmetric and favors atelectasis, correlate for atypical respiratory infectious symptoms. 3. Bilateral nephrolithiasis. 4. Atherosclerosis, cholelithiasis, and colonic diverticulosis. Right inguinal hernia containing bladder on a chronic basis. Electronically Signed   By: Tiburcio Pea M.D.   On: 02/25/2023 06:09   DG Chest 1 View  Result Date: 02/25/2023 CLINICAL DATA:  Flu like symptoms for 1 day EXAM: PORTABLE CHEST 1 VIEW COMPARISON:  10/31/2015 FINDINGS: Cardiac shadow is stable. The lungs  are well aerated bilaterally. Mild peribronchial cuffing is noted without focal confluent infiltrate. This likely represents a viral etiology. No bony abnormality is noted. IMPRESSION: Changes likely related to a viral bronchitis Electronically Signed   By: Alcide Clever M.D.   On: 02/25/2023 03:09    Pending Labs Unresulted Labs (From admission, onward)     Start     Ordered   02/25/23 0828  Hemoglobin A1c  Add-on,   AD       Comments: To assess prior glycemic control    02/25/23 0827            Vitals/Pain Today's Vitals   02/25/23 1007 02/25/23 1202 02/25/23 1310 02/25/23 1317  BP: (!) 175/88  (!) 165/86   Pulse: 70  86   Resp: 20  18   Temp:  98.4 F (36.9 C) 97.8 F (36.6 C)   TempSrc:  Oral Oral   SpO2: 93%  95% 96%  PainSc:        Isolation Precautions No active isolations  Medications Medications  acetaminophen (TYLENOL) tablet 650 mg (has no administration in time range)    Or  acetaminophen (TYLENOL) suppository 650 mg (has no administration in time range)  melatonin tablet 3 mg (has no administration in time range)  ondansetron (ZOFRAN) injection 4 mg (has no administration in time range)  ipratropium-albuterol (DUONEB) 0.5-2.5 (3) MG/3ML nebulizer solution 3 mL (3 mLs Nebulization Given 02/25/23 1317)  albuterol (PROVENTIL) (2.5 MG/3ML) 0.083% nebulizer solution 2.5 mg (has no administration in time range)  methylPREDNISolone sodium succinate (SOLU-MEDROL) 125 mg/2 mL injection 80 mg (has no administration in time range)  insulin aspart (novoLOG) injection 0-20 Units (4 Units Subcutaneous Given 02/25/23 1257)  apixaban (ELIQUIS) tablet 5 mg (has no administration in time range)  carvedilol (COREG) tablet 37.5 mg (has no administration in time range)  dronedarone (MULTAQ) tablet 400 mg (has no administration in time range)  DULoxetine (CYMBALTA) DR capsule 60 mg (60 mg Oral Given 02/25/23 1259)  empagliflozin (JARDIANCE) tablet 25 mg (25 mg Oral Given  02/25/23 1259)  levothyroxine (SYNTHROID) tablet 175 mcg (has no administration in time range)  losartan (COZAAR) tablet 50 mg (has no administration in time range)  metFORMIN (GLUCOPHAGE-XR) 24 hr tablet 1,000 mg (has no administration in time range)  rosuvastatin (CRESTOR) tablet 10 mg (10 mg Oral Given 02/25/23 1259)  cyanocobalamin (VITAMIN B12) tablet 1,000 mcg (1,000 mcg Oral Given 02/25/23 1259)  pantoprazole (PROTONIX) EC tablet 40 mg (40 mg Oral Given 02/25/23 1259)  ipratropium-albuterol (DUONEB) 0.5-2.5 (3) MG/3ML nebulizer solution 9 mL (9 mLs Nebulization Given 02/25/23 0311)  albuterol (PROVENTIL) (2.5 MG/3ML) 0.083% nebulizer solution (10 mg/hr Nebulization Given 02/25/23 0434)  methylPREDNISolone sodium succinate (SOLU-MEDROL) 125 mg/2 mL injection 125 mg (125 mg Intravenous Given 02/25/23 0638)  potassium chloride SA (KLOR-CON M) CR tablet 40 mEq (  40 mEq Oral Given 02/25/23 1014)    Mobility walks     Focused Assessments    R Recommendations: See Admitting Provider Note  Report given to:   Additional Notes:

## 2023-02-26 DIAGNOSIS — B348 Other viral infections of unspecified site: Secondary | ICD-10-CM

## 2023-02-26 DIAGNOSIS — I480 Paroxysmal atrial fibrillation: Secondary | ICD-10-CM | POA: Diagnosis not present

## 2023-02-26 DIAGNOSIS — J209 Acute bronchitis, unspecified: Secondary | ICD-10-CM | POA: Diagnosis not present

## 2023-02-26 DIAGNOSIS — I5032 Chronic diastolic (congestive) heart failure: Secondary | ICD-10-CM | POA: Diagnosis not present

## 2023-02-26 DIAGNOSIS — N1831 Chronic kidney disease, stage 3a: Secondary | ICD-10-CM | POA: Diagnosis not present

## 2023-02-26 LAB — RENAL FUNCTION PANEL
Albumin: 3.7 g/dL (ref 3.5–5.0)
Anion gap: 12 (ref 5–15)
BUN: 23 mg/dL (ref 8–23)
CO2: 18 mmol/L — ABNORMAL LOW (ref 22–32)
Calcium: 9.1 mg/dL (ref 8.9–10.3)
Chloride: 108 mmol/L (ref 98–111)
Creatinine, Ser: 1.06 mg/dL (ref 0.61–1.24)
GFR, Estimated: >60 mL/min (ref >60)
Glucose, Bld: 184 mg/dL — ABNORMAL HIGH (ref 70–99)
Phosphorus: 2.9 mg/dL (ref 2.5–4.6)
Potassium: 4.1 mmol/L (ref 3.5–5.1)
Sodium: 138 mmol/L (ref 135–145)

## 2023-02-26 LAB — RESPIRATORY PANEL BY PCR

## 2023-02-26 LAB — GLUCOSE, CAPILLARY
Glucose-Capillary: 137 mg/dL — ABNORMAL HIGH (ref 70–99)
Glucose-Capillary: 195 mg/dL — ABNORMAL HIGH (ref 70–99)
Glucose-Capillary: 225 mg/dL — ABNORMAL HIGH (ref 70–99)

## 2023-02-26 LAB — CBC
HCT: 42.9 % (ref 39.0–52.0)
Hemoglobin: 13.4 g/dL (ref 13.0–17.0)
MCH: 30.9 pg (ref 26.0–34.0)
MCHC: 31.2 g/dL (ref 30.0–36.0)
MCV: 98.8 fL (ref 80.0–100.0)
Platelets: 214 10*3/uL (ref 150–400)
RBC: 4.34 MIL/uL (ref 4.22–5.81)
RDW: 15 % (ref 11.5–15.5)
WBC: 14.2 10*3/uL — ABNORMAL HIGH (ref 4.0–10.5)
nRBC: 0 % (ref 0.0–0.2)

## 2023-02-26 LAB — PROCALCITONIN: Procalcitonin: <0.1 ng/mL

## 2023-02-26 LAB — MAGNESIUM: Magnesium: 2.4 mg/dL (ref 1.7–2.4)

## 2023-02-26 MED ORDER — IPRATROPIUM-ALBUTEROL 0.5-2.5 (3) MG/3ML IN SOLN
3.0000 mL | Freq: Three times a day (TID) | RESPIRATORY_TRACT | Status: DC
  Administered 2023-02-26 – 2023-02-27 (×3): 3 mL via RESPIRATORY_TRACT
  Filled 2023-02-26 (×3): qty 3

## 2023-02-26 NOTE — TOC Progression Note (Signed)
Transition of Care [facility]) - Inpatient Brief Assessment  Patient Details  Name: [name] [name] MRN: [id number] Date of Birth: 01-Apr-1951  Transition of Care [facility]) CM/SW Contact:    Ewing Schlein, LCSW Phone Number: 02/26/2023, 1:16 PM  Clinical Narrative: Patient has been reviewed. No TOC needs identified at this time.  Transition of Care Asessment: Insurance and Status: Insurance coverage has been reviewed Patient has primary care physician: Yes Home environment has been reviewed: Resides with spouse Prior level of function:: Independent at baseline Prior/Current Home Services: No current home services Social Determinants of Health Reivew: SDOH reviewed no interventions necessary Readmission risk has been reviewed: Yes Transition of care needs: no transition of care needs at this time  Social Determinants of Health (SDOH) Interventions SDOH Screenings   Food Insecurity: No Food Insecurity (02/25/2023)  Housing: Low Risk  (02/25/2023)  Transportation Needs: No Transportation Needs (02/25/2023)  Utilities: Not At Risk (02/25/2023)  Tobacco Use: Low Risk  (02/25/2023)   Readmission Risk Interventions    02/26/2023    1:16 PM  Readmission Risk Prevention Plan  Transportation Screening Complete  Home Care Screening Complete  Medication Review (RN CM) Complete

## 2023-02-26 NOTE — Progress Notes (Signed)
Mobility Specialist - Progress Note   02/26/23 1330  Oxygen Therapy  SpO2 93 %  O2 Device Room Air  Patient Activity (if Appropriate) Ambulating  Mobility  Activity Ambulated independently in hallway  Level of Assistance Independent  Assistive Device None  Distance Ambulated (ft) 500 ft  Activity Response Tolerated well  Mobility Referral Yes  $Mobility charge 1 Mobility  Mobility Specialist Start Time (ACUTE ONLY) 1318  Mobility Specialist Stop Time (ACUTE ONLY) 1325  Mobility Specialist Time Calculation (min) (ACUTE ONLY) 7 min   Pt received in bed and agreeable to mobility. No complaints during session. Pt to bed after session with all needs met.    Pre-mobility: 94% SpO2 (RA) During mobility: 93% SpO2 (RA) Post-mobility: 92% SPO2 (RA)  Chief Technology Officer

## 2023-02-26 NOTE — Progress Notes (Signed)
The patient ambulated with the mobility specialist. Oxygen saturation 94% at rest.Gait appeared to be steady. He reported some dyspnea with activity. Oxygen saturation sustained 92-93% room air with ambulation.

## 2023-02-26 NOTE — Plan of Care (Signed)

## 2023-02-26 NOTE — Progress Notes (Signed)
PROGRESS NOTE  [name] [name] WGN:[id number] DOB: 10-09-1950   PCP: Garlan Fillers, MD  Patient is from: Home.  Lives with wife.  Independently ambulates at baseline.  DOA: 02/25/2023 LOS: 1  Chief complaints Chief Complaint  Patient presents with   Shortness of Breath     Brief Narrative / Interim history: 72 year old M with PMH of paroxysmal A-fib/flutter on Eliquis, diastolic CHF, DM-2, CKD-3A, prostate cancer, gastric ulcer and OSA not on CPAP presenting with acute dyspnea, wheezing, runny nose and cough, and admitted for acute bronchitis with bronchospasm.  No history of asthma or COPD.  Never smoked cigarettes.  CXR suggested acute bronchitis.  BNP was only 161.  COVID-19 PCR negative.  Started on Solu-Medrol, scheduled and as needed nebulizers.  The next day, full RVP positive for rhinovirus.  Subjective: Seen and examined earlier this morning.  No major events overnight of this morning.  Reports improvement in his symptoms.  Patient's wife at bedside.  Objective: Vitals:   02/26/23 0934 02/26/23 1330 02/26/23 1348 02/26/23 1353  BP: (!) 143/78   (!) 145/79  Pulse: 65   62  Resp: 18   20  Temp: 98 F (36.7 C)   (!) 97.4 F (36.3 C)  TempSrc: Oral   Oral  SpO2: 95% 93% 93% 100%  Weight:      Height:        Examination:  GENERAL: No apparent distress.  Nontoxic. HEENT: MMM.  Vision and hearing grossly intact.  NECK: Supple.  No apparent JVD.  RESP:  No IWOB.  Fair aeration bilaterally.  Rhonchi bilaterally. CVS:  RRR. Heart sounds normal.  ABD/GI/GU: BS+. Abd soft, NTND.  MSK/EXT:  Moves extremities. No apparent deformity. No edema.  SKIN: no apparent skin lesion or wound NEURO: Awake, alert and oriented appropriately.  No apparent focal neuro deficit. PSYCH: Calm. Normal affect.   Procedures:  None  Microbiology summarized: COVID-19 PCR negative Full RVP positive for rhinovirus  Assessment and plan: Acute bronchitis with bronchospasm due to  rhinovirus infection: Presented with URI symptoms, dyspnea and wheezing.  Chest x-ray suggested acute bronchitis.  RVP positive for rhinovirus.  Pro-Cal negative.  No history of asthma, COPD or cigarette smoking.  Symptoms improved. -Continue IV Solu-Medrol, scheduled and as needed nebulizers. -Continue droplet precaution -Okay to continue carvedilol as long as his symptoms are not worse. -Likely discharge on 11/18 if he continues to improve.  Paroxysmal A-fib/flutter: In A-fib but rate controlled.  Reports good compliance with Eliquis -Continue home carvedilol, Multaq and apixaban -Optimize electrolytes  Chronic diastolic CHF: TTE in 2021 with LVEF of 60 to 65%, no RWMA.  Not on diuretics at home.  Appears euvolemic. -Continue home Jardiance  NIDDM-2 with hyperglycemia, HLD and neuropathy: Hyperglycemia likely due to steroid.  A1c 6.7%. Recent Labs  Lab 02/25/23 1230 02/25/23 1634 02/25/23 2211 02/26/23 0729  GLUCAP 180* 213* 242* 137*  -Continue home Jardiance and metformin -SSI -Continue home Cymbalta and statin  Hypokalemia/hypocalcemia -Monitor replenish as appropriate  CKD-3A: Cr better than baseline. -Monitor  Obstructive sleep apnea with CPAP intolerance-noted Has intolerance to CPAP.   Hypothyroidism -Continue home Synthroid  Essential hypertension: Blood pressure within acceptable range -Continue home meds  Leukocytosis: Likely demargination from steroids -Monitor    Morbid obesity: Elevated BMI with comorbidity as above.  On GLP-1 agonist. Body mass index is 33.8 kg/m. -Encourage lifestyle change to lose weight          DVT prophylaxis:  SCDs Start: 02/25/23 0637 apixaban (  ELIQUIS) tablet 5 mg  Code Status: Full code Family Communication: Updated patient's wife at bedside Level of care: Med-Surg Status is: Inpatient Remains inpatient appropriate because: Due to acute bronchitis/rhinovirus infection   Final disposition: Home Consultants:   None  35 minutes with more than 50% spent in reviewing records, counseling patient/family and coordinating care.   Sch Meds:  Scheduled Meds:  apixaban  5 mg Oral BID   carvedilol  37.5 mg Oral BID WC   cyanocobalamin  1,000 mcg Oral Daily   dronedarone  400 mg Oral BID WC   DULoxetine  60 mg Oral Daily   empagliflozin  25 mg Oral Daily   insulin aspart  0-20 Units Subcutaneous TID WC   ipratropium-albuterol  3 mL Nebulization TID   levothyroxine  175 mcg Oral QAC breakfast   losartan  50 mg Oral QPM   metFORMIN  1,000 mg Oral BID WC   methylPREDNISolone (SOLU-MEDROL) injection  80 mg Intravenous Daily   pantoprazole  40 mg Oral Daily   rosuvastatin  10 mg Oral Daily   Continuous Infusions: PRN Meds:.acetaminophen **OR** acetaminophen, albuterol, guaiFENesin-dextromethorphan, melatonin, ondansetron (ZOFRAN) IV  Antimicrobials: Anti-infectives (From admission, onward)    None        I have personally reviewed the following labs and images: CBC: Recent Labs  Lab 02/25/23 0251 02/26/23 1244  WBC 9.9 14.2*  NEUTROABS 7.3  --   HGB 12.5* 13.4  HCT 39.7 42.9  MCV 95.9 98.8  PLT 207 214   BMP &GFR Recent Labs  Lab 02/25/23 0251 02/25/23 0459 02/26/23 1244  NA 133*  --  138  K 3.4*  --  4.1  CL 105  --  108  CO2 20*  --  18*  GLUCOSE 131*  --  184*  BUN 16  --  23  CREATININE 1.43*  --  1.06  CALCIUM 8.3*  --  9.1  MG  --  2.0 2.4  PHOS  --  2.8 2.9   Estimated Creatinine Clearance: 70.2 mL/min (by C-G formula based on SCr of 1.06 mg/dL). Liver & Pancreas: Recent Labs  Lab 02/25/23 0251 02/26/23 1244  AST 20  --   ALT 22  --   ALKPHOS 73  --   BILITOT 0.7  --   PROT 6.8  --   ALBUMIN 3.8 3.7   No results for input(s): "LIPASE", "AMYLASE" in the last 168 hours. No results for input(s): "AMMONIA" in the last 168 hours. Diabetic: Recent Labs    02/25/23 1704  HGBA1C 6.7*   Recent Labs  Lab 02/25/23 1230 02/25/23 1634 02/25/23 2211  02/26/23 0729  GLUCAP 180* 213* 242* 137*   Cardiac Enzymes: No results for input(s): "CKTOTAL", "CKMB", "CKMBINDEX", "TROPONINI" in the last 168 hours. No results for input(s): "PROBNP" in the last 8760 hours. Coagulation Profile: No results for input(s): "INR", "PROTIME" in the last 168 hours. Thyroid Function Tests: No results for input(s): "TSH", "T4TOTAL", "FREET4", "T3FREE", "THYROIDAB" in the last 72 hours. Lipid Profile: No results for input(s): "CHOL", "HDL", "LDLCALC", "TRIG", "CHOLHDL", "LDLDIRECT" in the last 72 hours. Anemia Panel: No results for input(s): "VITAMINB12", "FOLATE", "FERRITIN", "TIBC", "IRON", "RETICCTPCT" in the last 72 hours. Urine analysis:    Component Value Date/Time   COLORURINE YELLOW 02/25/2023 0518   APPEARANCEUR CLEAR 02/25/2023 0518   LABSPEC 1.028 02/25/2023 0518   PHURINE 5.0 02/25/2023 0518   GLUCOSEU >=500 (A) 02/25/2023 0518   HGBUR NEGATIVE 02/25/2023 0518   BILIRUBINUR NEGATIVE 02/25/2023 0518  KETONESUR NEGATIVE 02/25/2023 0518   PROTEINUR 30 (A) 02/25/2023 0518   UROBILINOGEN 1.0 11/22/2014 1337   NITRITE NEGATIVE 02/25/2023 0518   LEUKOCYTESUR NEGATIVE 02/25/2023 0518   Sepsis Labs: Invalid input(s): "PROCALCITONIN", "LACTICIDVEN"  Microbiology: Recent Results (from the past 240 hour(s))  SARS Coronavirus 2 by RT PCR (hospital order, performed in [facility] hospital lab) *cepheid single result test* Anterior Nasal Swab     Status: None   Collection Time: 02/25/23  2:51 AM   Specimen: Anterior Nasal Swab  Result Value Ref Range Status   SARS Coronavirus 2 by RT PCR NEGATIVE NEGATIVE Final    Comment: (NOTE) SARS-CoV-2 target nucleic acids are NOT DETECTED.  The SARS-CoV-2 RNA is generally detectable in upper and lower respiratory specimens during the acute phase of infection. The lowest concentration of SARS-CoV-2 viral copies this assay can detect is 250 copies / mL. A negative result does not preclude SARS-CoV-2  infection and should not be used as the sole basis for treatment or other patient management decisions.  A negative result may occur with improper specimen collection / handling, submission of specimen other than nasopharyngeal swab, presence of viral mutation(s) within the areas targeted by this assay, and inadequate number of viral copies (<250 copies / mL). A negative result must be combined with clinical observations, patient history, and epidemiological information.  Fact Sheet for Patients:   RoadLapTop.co.za  Fact Sheet for Healthcare Providers: http://kim-miller.com/  This test is not yet approved or  cleared by the [place] FDA and has been authorized for detection and/or diagnosis of SARS-CoV-2 by FDA under an Emergency Use Authorization (EUA).  This EUA will remain in effect (meaning this test can be used) for the duration of the COVID-19 declaration under Section 564(b)(1) of the Act, 21 U.S.C. section 360bbb-3(b)(1), unless the authorization is terminated or revoked sooner.  Performed at [facility], [address], [place], [place] [address]   Respiratory (~20 pathogens) panel by PCR     Status: Abnormal   Collection Time: 02/26/23  9:56 AM   Specimen: Nasopharyngeal Swab; Respiratory  Result Value Ref Range Status   Adenovirus NOT DETECTED NOT DETECTED Final   Coronavirus 229E NOT DETECTED NOT DETECTED Final    Comment: (NOTE) The Coronavirus on the Respiratory Panel, DOES NOT test for the novel  Coronavirus (2019 nCoV)    Coronavirus HKU1 NOT DETECTED NOT DETECTED Final   Coronavirus NL63 NOT DETECTED NOT DETECTED Final   Coronavirus OC43 NOT DETECTED NOT DETECTED Final   Metapneumovirus NOT DETECTED NOT DETECTED Final   Rhinovirus / Enterovirus DETECTED (A) NOT DETECTED Final   Influenza A NOT DETECTED NOT DETECTED Final   Influenza B NOT DETECTED NOT DETECTED Final   Parainfluenza Virus 1  NOT DETECTED NOT DETECTED Final   Parainfluenza Virus 2 NOT DETECTED NOT DETECTED Final   Parainfluenza Virus 3 NOT DETECTED NOT DETECTED Final   Parainfluenza Virus 4 NOT DETECTED NOT DETECTED Final   Respiratory Syncytial Virus NOT DETECTED NOT DETECTED Final   Bordetella pertussis NOT DETECTED NOT DETECTED Final   Bordetella Parapertussis NOT DETECTED NOT DETECTED Final   Chlamydophila pneumoniae NOT DETECTED NOT DETECTED Final   Mycoplasma pneumoniae NOT DETECTED NOT DETECTED Final    Comment: Performed at [facility], [address], [place], [place] [address]    Radiology Studies: No results found.    Chiyo Fay T. Yania Bogie [facility]  If 7PM-7AM, please contact night-coverage [email or web address] 02/26/2023, 2:11 PM

## 2023-02-26 NOTE — Plan of Care (Signed)
  Problem: Education: Goal: Ability to describe self-care measures that may prevent or decrease complications (Diabetes Survival Skills Education) will improve Outcome: Progressing Goal: Individualized Educational Video(s) Outcome: Progressing   

## 2023-02-27 DIAGNOSIS — I5032 Chronic diastolic (congestive) heart failure: Secondary | ICD-10-CM | POA: Diagnosis not present

## 2023-02-27 DIAGNOSIS — N1831 Chronic kidney disease, stage 3a: Secondary | ICD-10-CM

## 2023-02-27 DIAGNOSIS — J209 Acute bronchitis, unspecified: Secondary | ICD-10-CM | POA: Diagnosis not present

## 2023-02-27 DIAGNOSIS — G4733 Obstructive sleep apnea (adult) (pediatric): Secondary | ICD-10-CM

## 2023-02-27 DIAGNOSIS — I480 Paroxysmal atrial fibrillation: Secondary | ICD-10-CM

## 2023-02-27 DIAGNOSIS — E876 Hypokalemia: Secondary | ICD-10-CM

## 2023-02-27 DIAGNOSIS — D649 Anemia, unspecified: Secondary | ICD-10-CM

## 2023-02-27 DIAGNOSIS — E1142 Type 2 diabetes mellitus with diabetic polyneuropathy: Secondary | ICD-10-CM

## 2023-02-27 DIAGNOSIS — E038 Other specified hypothyroidism: Secondary | ICD-10-CM

## 2023-02-27 DIAGNOSIS — E1165 Type 2 diabetes mellitus with hyperglycemia: Secondary | ICD-10-CM

## 2023-02-27 LAB — GLUCOSE, CAPILLARY
Glucose-Capillary: 179 mg/dL — ABNORMAL HIGH (ref 70–99)
Glucose-Capillary: 132 mg/dL — ABNORMAL HIGH (ref 70–99)

## 2023-02-27 MED ORDER — ALBUTEROL SULFATE HFA 108 (90 BASE) MCG/ACT IN AERS
2.0000 | INHALATION_SPRAY | Freq: Four times a day (QID) | RESPIRATORY_TRACT | 2 refills | Status: AC | PRN
Start: 2023-02-27 — End: ?

## 2023-02-27 NOTE — Progress Notes (Signed)
AVS reviewed w/ pt who verbalized an understanding. No other questions at this time. PIV removed by primary RN. Pt to lbby via w/c - home w/ his wife

## 2023-02-27 NOTE — Discharge Summary (Signed)
Physician Discharge Summary  [name] [name] MVH:[id number] DOB: 10-Aug-1950 DOA: 02/25/2023  PCP: [name] Fillers, MD  Admit date: 02/25/2023 Discharge date: 02/27/2023 Admitted From: Home Disposition: Home Recommendations for Outpatient Follow-up:  Follow up with PCP in 1 week Check CMP and CBC at follow-up Please follow up on the following pending results: None  Home Health: Not indicated Equipment/Devices: Not indicated  Discharge Condition: Stable CODE STATUS: Full code  Follow-up Information     [name] Fillers, MD. Schedule an appointment as soon as possible for a visit in 1 week(s).   Specialty: Internal Medicine Contact information: [address] [place] [address] [phone]                 Hospital course 72 year old M with PMH of paroxysmal A-fib/flutter on Eliquis, diastolic CHF, DM-2, CKD-3A, prostate cancer, gastric ulcer and OSA not on CPAP presenting with acute dyspnea, wheezing, runny nose and cough, and admitted for acute bronchitis with bronchospasm.  No history of asthma or COPD.  Never smoked cigarettes.  CXR suggested acute bronchitis.  BNP was only 161.  COVID-19 PCR negative.  Started on Solu-Medrol, scheduled and as needed nebulizers.   The next day, full RVP positive for rhinovirus.  Symptoms improved. On the day of discharge, patient felt well and ready to go home.  Ambulated on room air and maintained good saturation.  He was prescribed albuterol as needed.   See individual problem list below for more.   Problems addressed during this hospitalization Acute bronchitis with bronchospasm due to rhinovirus infection: Presented with URI symptoms, dyspnea and wheezing.  Chest x-ray suggested acute bronchitis.  RVP positive for rhinovirus.  Pro-Cal negative.  No history of asthma, COPD or cigarette smoking.  Symptoms basically resolved.  Received systemic steroid for 3 days. -Discharged on albuterol as needed.   Paroxysmal  A-fib/flutter: In A-fib but rate controlled.  Reports good compliance with Eliquis -Continue home carvedilol, Multaq and apixaban   Chronic diastolic CHF: TTE in 2021 with LVEF of 60 to 65%, no RWMA.  Not on diuretics at home.  Appears euvolemic. -Continue home Jardiance   NIDDM-2 with hyperglycemia, HLD and neuropathy: Hyperglycemia likely due to steroid.  A1c 6.7%. -Continue home meds   Hypokalemia/hypocalcemia: Resolved   CKD-3A: Cr better than baseline.   Obstructive sleep apnea with CPAP intolerance-noted   Hypothyroidism -Continue home Synthroid   Essential hypertension: Blood pressure within acceptable range -Continue home meds   Leukocytosis: Likely demargination from steroids -Recheck CBC at follow-up     Morbid obesity: Elevated BMI with comorbidity as above.  On GLP-1 agonist. Body mass index is 33.77 kg/m. -Encourage lifestyle change to lose weight            Time spent 35 minutes  Vital signs Vitals:   02/26/23 1934 02/26/23 2026 02/27/23 0502 02/27/23 0739  BP: (!) 146/77  (!) 152/77   Pulse: 65  73   Temp: 97.8 F (36.6 C)  98.4 F (36.9 C)   Resp: 20  16   Height:      Weight:   97.8 kg   SpO2: 96% 98% 97% 93%  TempSrc: Oral  Oral   BMI (Calculated):   33.76      Discharge exam  GENERAL: No apparent distress.  Nontoxic. HEENT: MMM.  Vision and hearing grossly intact.  NECK: Supple.  No apparent JVD.  RESP:  No IWOB.  Fair aeration bilaterally. CVS: Irregular rhythm.  Normal rate.Marland Kitchen Heart sounds normal.  ABD/GI/GU:  BS+. Abd soft, NTND.  MSK/EXT:  Moves extremities. No apparent deformity. No edema.  SKIN: no apparent skin lesion or wound NEURO: Awake and alert. Oriented appropriately.  No apparent focal neuro deficit. PSYCH: Calm. Normal affect.   Discharge Instructions Discharge Instructions     Diet - low sodium heart healthy   Complete by: As directed    Diet Carb Modified   Complete by: As directed    Discharge instructions    Complete by: As directed    It has been a pleasure taking care of you!  You were hospitalized due to acute bronchitis from common cold virus (rhinovirus).  Your symptoms improved with treatment.  You may use albuterol as needed for shortness of breath or wheezing.  Keep yourself hydrated.  Follow-up with your primary care doctor in 1 to 2 weeks or sooner if needed.   Take care,   Increase activity slowly   Complete by: As directed       Allergies as of 02/27/2023       Reactions   Bystolic [nebivolol Hcl] Swelling, Other (See Comments)   Bradycardia   Calcium Channel Blockers Swelling, Other (See Comments)   LE edema   Feraheme [ferumoxytol] Shortness Of Breath, Nausea And Vomiting, Other (See Comments)   Chest pain   Phenergan [promethazine Hcl] Itching, Other (See Comments)   Hallucination   Beta Adrenergic Blockers Swelling, Other (See Comments)   bradycardia   Cardizem [diltiazem] Other (See Comments)   Foot swelling and increased his heart rate   Norvasc [amlodipine] Other (See Comments)   Unknown reaction   Aspirin Other (See Comments)   Hx of ulcer   Nsaids Other (See Comments)   Hx ulcer   Prednisone Other (See Comments)   Hx of ulcer        Medication List     TAKE these medications    acetaminophen 500 MG tablet Commonly known as: TYLENOL Take 1,000 mg by mouth every 6 (six) hours as needed for moderate pain or headache.   albuterol 108 (90 Base) MCG/ACT inhaler Commonly known as: VENTOLIN HFA Inhale 2 puffs into the lungs every 6 (six) hours as needed for wheezing or shortness of breath.   apixaban 5 MG Tabs tablet Commonly known as: ELIQUIS Take 1 tablet (5 mg total) by mouth 2 (two) times daily.   BONINE PO Take 1 tablet by mouth daily as needed (nausea).   carvedilol 12.5 MG tablet Commonly known as: COREG Take 3 tablets (37.5 mg total) by mouth 2 (two) times daily.   cyanocobalamin 1000 MCG tablet Commonly known as: VITAMIN B12 Take  1,000 mcg by mouth daily.   DULoxetine 60 MG capsule Commonly known as: CYMBALTA Take 60 mg by mouth daily.   FOLIC ACID PO Take 1,333 mcg by mouth daily.   Glucosamine HCl 1500 MG Tabs Take 1,500 mg by mouth in the morning and at bedtime.   Jardiance 25 MG Tabs tablet Generic drug: empagliflozin Take 25 mg by mouth daily.   levothyroxine 175 MCG tablet Commonly known as: SYNTHROID Take 175 mcg by mouth daily.   losartan 50 MG tablet Commonly known as: COZAAR Take 1 tablet (50 mg total) by mouth every evening.   metFORMIN 500 MG 24 hr tablet Commonly known as: GLUCOPHAGE-XR Take 1,000 mg by mouth 2 (two) times daily.   Multaq 400 MG tablet Generic drug: dronedarone Take 1 tablet (400 mg total) by mouth 2 (two) times daily with a meal.   multivitamin  with minerals Tabs tablet Take 1 tablet by mouth daily. One-A-Day for Men   omeprazole 40 MG capsule Commonly known as: PRILOSEC Take 40 mg by mouth daily.   polycarbophil 625 MG tablet Commonly known as: FIBERCON Take 1,250 mg by mouth every evening.   PreserVision AREDS 2 Caps Take 1 capsule by mouth 2 (two) times daily.   PROBIOTIC PO Take 1 capsule by mouth at bedtime.   rosuvastatin 10 MG tablet Commonly known as: CRESTOR Take 1 tablet (10 mg total) by mouth daily.   Trulicity 1.5 MG/0.5ML Soaj Generic drug: Dulaglutide Inject 1.5 mg into the skin once a week.   Vitamin D (Ergocalciferol) 1.25 MG (50000 UNIT) Caps capsule Commonly known as: DRISDOL Take 50,000 Units by mouth every 7 (seven) days. Tuesdays        Consultations: None  Procedures/Studies:   CT Renal Stone Study  Result Date: 02/25/2023 CLINICAL DATA:  Abdominal/flank pain with stone suspected EXAM: CT ABDOMEN AND PELVIS WITHOUT CONTRAST TECHNIQUE: Multidetector CT imaging of the abdomen and pelvis was performed following the standard protocol without IV contrast. RADIATION DOSE REDUCTION: This exam was performed according to the  departmental dose-optimization program which includes automated exposure control, adjustment of the mA and/or kV according to patient size and/or use of iterative reconstruction technique. COMPARISON:  09/05/2021 FINDINGS: Lower chest: Symmetric ground-glass and reticular density in the lower lobes. Hepatobiliary: No focal liver abnormality.Small calculi at the gallbladder neck. No evidence of acute cholecystitis. Pancreas: Generalized fatty atrophy. Spleen: Unremarkable. Adrenals/Urinary Tract: Negative adrenals. No hydronephrosis or ureteral stone. Small left upper pole renal cystic density. Collapsed urinary bladder. A small portion of the bladder herniates into the right inguinal canal. Lobulated stone at the lower left kidney measuring 9 mm. Punctate right lower pole calculus. A larger previously seen right lower pole stone is no longer visualized. Stomach/Bowel: No obstruction. Distal colonic diverticulosis. No evidence of bowel inflammation. Vascular/Lymphatic: Atheromatous calcification of the aorta and iliacs. No mass or adenopathy. Reproductive:Prostatectomy. Other: No ascites or pneumoperitoneum. Musculoskeletal: No acute abnormalities. Small geographic areas of sclerosis in the femoral heads, there may be mild chronic avascular necrosis. Ordinary lumbar spine degeneration. IMPRESSION: 1. No acute intra-abdominal finding. No hydronephrosis or ureteral calculus. 2. Pulmonary density at the lung bases is symmetric and favors atelectasis, correlate for atypical respiratory infectious symptoms. 3. Bilateral nephrolithiasis. 4. Atherosclerosis, cholelithiasis, and colonic diverticulosis. Right inguinal hernia containing bladder on a chronic basis. Electronically Signed   By: Tiburcio Pea M.D.   On: 02/25/2023 06:09   DG Chest 1 View  Result Date: 02/25/2023 CLINICAL DATA:  Flu like symptoms for 1 day EXAM: PORTABLE CHEST 1 VIEW COMPARISON:  10/31/2015 FINDINGS: Cardiac shadow is stable. The lungs are  well aerated bilaterally. Mild peribronchial cuffing is noted without focal confluent infiltrate. This likely represents a viral etiology. No bony abnormality is noted. IMPRESSION: Changes likely related to a viral bronchitis Electronically Signed   By: Alcide Clever M.D.   On: 02/25/2023 03:09       The results of significant diagnostics from this hospitalization (including imaging, microbiology, ancillary and laboratory) are listed below for reference.     Microbiology: Recent Results (from the past 240 hour(s))  SARS Coronavirus 2 by RT PCR (hospital order, performed in [facility] lab) *cepheid single result test* Anterior Nasal Swab     Status: None   Collection Time: 02/25/23  2:51 AM   Specimen: Anterior Nasal Swab  Result Value Ref Range Status   SARS  Coronavirus 2 by RT PCR NEGATIVE NEGATIVE Final    Comment: (NOTE) SARS-CoV-2 target nucleic acids are NOT DETECTED.  The SARS-CoV-2 RNA is generally detectable in upper and lower respiratory specimens during the acute phase of infection. The lowest concentration of SARS-CoV-2 viral copies this assay can detect is 250 copies / mL. A negative result does not preclude SARS-CoV-2 infection and should not be used as the sole basis for treatment or other patient management decisions.  A negative result may occur with improper specimen collection / handling, submission of specimen other than nasopharyngeal swab, presence of viral mutation(s) within the areas targeted by this assay, and inadequate number of viral copies (<250 copies / mL). A negative result must be combined with clinical observations, patient history, and epidemiological information.  Fact Sheet for Patients:   RoadLapTop.co.za  Fact Sheet for Healthcare Providers: http://kim-miller.com/  This test is not yet approved or  cleared by the [place] FDA and has been authorized for detection and/or diagnosis of  SARS-CoV-2 by FDA under an Emergency Use Authorization (EUA).  This EUA will remain in effect (meaning this test can be used) for the duration of the COVID-19 declaration under Section 564(b)(1) of the Act, 21 U.S.C. section 360bbb-3(b)(1), unless the authorization is terminated or revoked sooner.  Performed at [facility], [address] [place], [place] [address]   Respiratory (~20 pathogens) panel by PCR     Status: Abnormal   Collection Time: 02/26/23  9:56 AM   Specimen: Nasopharyngeal Swab; Respiratory  Result Value Ref Range Status   Adenovirus NOT DETECTED NOT DETECTED Final   Coronavirus 229E NOT DETECTED NOT DETECTED Final    Comment: (NOTE) The Coronavirus on the Respiratory Panel, DOES NOT test for the novel  Coronavirus (2019 nCoV)    Coronavirus HKU1 NOT DETECTED NOT DETECTED Final   Coronavirus NL63 NOT DETECTED NOT DETECTED Final   Coronavirus OC43 NOT DETECTED NOT DETECTED Final   Metapneumovirus NOT DETECTED NOT DETECTED Final   Rhinovirus / Enterovirus DETECTED (A) NOT DETECTED Final   Influenza A NOT DETECTED NOT DETECTED Final   Influenza B NOT DETECTED NOT DETECTED Final   Parainfluenza Virus 1 NOT DETECTED NOT DETECTED Final   Parainfluenza Virus 2 NOT DETECTED NOT DETECTED Final   Parainfluenza Virus 3 NOT DETECTED NOT DETECTED Final   Parainfluenza Virus 4 NOT DETECTED NOT DETECTED Final   Respiratory Syncytial Virus NOT DETECTED NOT DETECTED Final   Bordetella pertussis NOT DETECTED NOT DETECTED Final   Bordetella Parapertussis NOT DETECTED NOT DETECTED Final   Chlamydophila pneumoniae NOT DETECTED NOT DETECTED Final   Mycoplasma pneumoniae NOT DETECTED NOT DETECTED Final    Comment: Performed at [facility], [address], [place], [place] [address]     Labs:  CBC: Recent Labs  Lab 02/25/23 0251 02/26/23 1244  WBC 9.9 14.2*  NEUTROABS 7.3  --   HGB 12.5* 13.4  HCT 39.7 42.9  MCV 95.9 98.8  PLT 207 214   BMP  &GFR Recent Labs  Lab 02/25/23 0251 02/25/23 0459 02/26/23 1244  NA 133*  --  138  K 3.4*  --  4.1  CL 105  --  108  CO2 20*  --  18*  GLUCOSE 131*  --  184*  BUN 16  --  23  CREATININE 1.43*  --  1.06  CALCIUM 8.3*  --  9.1  MG  --  2.0 2.4  PHOS  --  2.8 2.9  Estimated Creatinine Clearance: 70.2 mL/min (by C-G formula based on SCr of 1.06 mg/dL). Liver & Pancreas: Recent Labs  Lab 02/25/23 0251 02/26/23 1244  AST 20  --   ALT 22  --   ALKPHOS 73  --   BILITOT 0.7  --   PROT 6.8  --   ALBUMIN 3.8 3.7   No results for input(s): "LIPASE", "AMYLASE" in the last 168 hours. No results for input(s): "AMMONIA" in the last 168 hours. Diabetic: Recent Labs    02/25/23 1704  HGBA1C 6.7*   Recent Labs  Lab 02/26/23 0729 02/26/23 1157 02/26/23 1659 02/26/23 2141 02/27/23 0759  GLUCAP 137* 179* 195* 225* 132*   Cardiac Enzymes: No results for input(s): "CKTOTAL", "CKMB", "CKMBINDEX", "TROPONINI" in the last 168 hours. No results for input(s): "PROBNP" in the last 8760 hours. Coagulation Profile: No results for input(s): "INR", "PROTIME" in the last 168 hours. Thyroid Function Tests: No results for input(s): "TSH", "T4TOTAL", "FREET4", "T3FREE", "THYROIDAB" in the last 72 hours. Lipid Profile: No results for input(s): "CHOL", "HDL", "LDLCALC", "TRIG", "CHOLHDL", "LDLDIRECT" in the last 72 hours. Anemia Panel: No results for input(s): "VITAMINB12", "FOLATE", "FERRITIN", "TIBC", "IRON", "RETICCTPCT" in the last 72 hours. Urine analysis:    Component Value Date/Time   COLORURINE YELLOW 02/25/2023 0518   APPEARANCEUR CLEAR 02/25/2023 0518   LABSPEC 1.028 02/25/2023 0518   PHURINE 5.0 02/25/2023 0518   GLUCOSEU >=500 (A) 02/25/2023 0518   HGBUR NEGATIVE 02/25/2023 0518   BILIRUBINUR NEGATIVE 02/25/2023 0518   KETONESUR NEGATIVE 02/25/2023 0518   PROTEINUR 30 (A) 02/25/2023 0518   UROBILINOGEN 1.0 11/22/2014 1337   NITRITE NEGATIVE 02/25/2023 0518   LEUKOCYTESUR  NEGATIVE 02/25/2023 0518   Sepsis Labs: Invalid input(s): "PROCALCITONIN", "LACTICIDVEN"   SIGNED:  Almon Hercules, MD  [facility] 02/27/2023, 10:35 PM

## 2023-03-02 DIAGNOSIS — I5042 Chronic combined systolic (congestive) and diastolic (congestive) heart failure: Secondary | ICD-10-CM | POA: Diagnosis not present

## 2023-03-02 DIAGNOSIS — N200 Calculus of kidney: Secondary | ICD-10-CM | POA: Diagnosis not present

## 2023-03-02 DIAGNOSIS — G4733 Obstructive sleep apnea (adult) (pediatric): Secondary | ICD-10-CM | POA: Diagnosis not present

## 2023-03-02 DIAGNOSIS — J206 Acute bronchitis due to rhinovirus: Secondary | ICD-10-CM | POA: Diagnosis not present

## 2023-03-02 DIAGNOSIS — D649 Anemia, unspecified: Secondary | ICD-10-CM | POA: Diagnosis not present

## 2023-03-02 DIAGNOSIS — N1831 Chronic kidney disease, stage 3a: Secondary | ICD-10-CM | POA: Diagnosis not present

## 2023-03-02 DIAGNOSIS — I130 Hypertensive heart and chronic kidney disease with heart failure and stage 1 through stage 4 chronic kidney disease, or unspecified chronic kidney disease: Secondary | ICD-10-CM | POA: Diagnosis not present

## 2023-03-02 DIAGNOSIS — D72829 Elevated white blood cell count, unspecified: Secondary | ICD-10-CM | POA: Diagnosis not present

## 2023-03-02 DIAGNOSIS — E669 Obesity, unspecified: Secondary | ICD-10-CM | POA: Diagnosis not present

## 2023-03-02 DIAGNOSIS — I480 Paroxysmal atrial fibrillation: Secondary | ICD-10-CM | POA: Diagnosis not present

## 2023-03-02 DIAGNOSIS — E1151 Type 2 diabetes mellitus with diabetic peripheral angiopathy without gangrene: Secondary | ICD-10-CM | POA: Diagnosis not present

## 2023-03-03 ENCOUNTER — Telehealth (HOSPITAL_BASED_OUTPATIENT_CLINIC_OR_DEPARTMENT_OTHER): Payer: Self-pay | Admitting: Cardiovascular Disease

## 2023-03-03 NOTE — Telephone Encounter (Signed)
Patient wife brought inpaperwork for [place] to sign. Will be in [place] box

## 2023-03-06 ENCOUNTER — Telehealth: Payer: Self-pay | Admitting: *Deleted

## 2023-03-06 NOTE — Telephone Encounter (Signed)
-----   Message from Nurse Deno Etienne sent at 09/12/2022 11:02 AM EDT ----- Call to remind pt to have cbc, ibc, ferritin drawn around 03/12/23. Pyrtle pt. Orders already in epic. See lab result notes from 09/2022 for further correspondence.

## 2023-03-06 NOTE — Telephone Encounter (Signed)
Called to remind pt he is due for labs next week. Patient states that he had labs completed at his PCP recently and will have his PCP send results to our office.

## 2023-03-13 ENCOUNTER — Telehealth: Payer: Self-pay

## 2023-03-13 NOTE — Telephone Encounter (Signed)
Received labs/ records- put in box

## 2023-03-16 DIAGNOSIS — L814 Other melanin hyperpigmentation: Secondary | ICD-10-CM | POA: Diagnosis not present

## 2023-03-16 DIAGNOSIS — Z85828 Personal history of other malignant neoplasm of skin: Secondary | ICD-10-CM | POA: Diagnosis not present

## 2023-03-16 DIAGNOSIS — D1801 Hemangioma of skin and subcutaneous tissue: Secondary | ICD-10-CM | POA: Diagnosis not present

## 2023-03-16 DIAGNOSIS — L570 Actinic keratosis: Secondary | ICD-10-CM | POA: Diagnosis not present

## 2023-03-16 DIAGNOSIS — D224 Melanocytic nevi of scalp and neck: Secondary | ICD-10-CM | POA: Diagnosis not present

## 2023-03-16 DIAGNOSIS — Z85820 Personal history of malignant melanoma of skin: Secondary | ICD-10-CM | POA: Diagnosis not present

## 2023-03-16 DIAGNOSIS — L821 Other seborrheic keratosis: Secondary | ICD-10-CM | POA: Diagnosis not present

## 2023-03-16 DIAGNOSIS — D225 Melanocytic nevi of trunk: Secondary | ICD-10-CM | POA: Diagnosis not present

## 2023-03-16 DIAGNOSIS — D2262 Melanocytic nevi of left upper limb, including shoulder: Secondary | ICD-10-CM | POA: Diagnosis not present

## 2023-03-21 DIAGNOSIS — Z23 Encounter for immunization: Secondary | ICD-10-CM | POA: Diagnosis not present

## 2023-03-28 ENCOUNTER — Other Ambulatory Visit (HOSPITAL_BASED_OUTPATIENT_CLINIC_OR_DEPARTMENT_OTHER): Payer: Self-pay | Admitting: Cardiovascular Disease

## 2023-04-07 NOTE — Telephone Encounter (Signed)
 Waiting for Dr Duke Salvia to sign

## 2023-04-14 DIAGNOSIS — N201 Calculus of ureter: Secondary | ICD-10-CM | POA: Diagnosis not present

## 2023-04-18 NOTE — Telephone Encounter (Signed)
Forms faxed, confirmation received

## 2023-04-27 DIAGNOSIS — N200 Calculus of kidney: Secondary | ICD-10-CM | POA: Diagnosis not present

## 2023-05-03 ENCOUNTER — Other Ambulatory Visit (HOSPITAL_BASED_OUTPATIENT_CLINIC_OR_DEPARTMENT_OTHER): Payer: Self-pay | Admitting: Cardiovascular Disease

## 2023-05-09 DIAGNOSIS — E782 Mixed hyperlipidemia: Secondary | ICD-10-CM | POA: Diagnosis not present

## 2023-05-09 DIAGNOSIS — N182 Chronic kidney disease, stage 2 (mild): Secondary | ICD-10-CM | POA: Diagnosis not present

## 2023-05-09 DIAGNOSIS — E669 Obesity, unspecified: Secondary | ICD-10-CM | POA: Diagnosis not present

## 2023-05-09 DIAGNOSIS — I129 Hypertensive chronic kidney disease with stage 1 through stage 4 chronic kidney disease, or unspecified chronic kidney disease: Secondary | ICD-10-CM | POA: Diagnosis not present

## 2023-05-09 DIAGNOSIS — E1121 Type 2 diabetes mellitus with diabetic nephropathy: Secondary | ICD-10-CM | POA: Diagnosis not present

## 2023-05-24 ENCOUNTER — Other Ambulatory Visit (HOSPITAL_BASED_OUTPATIENT_CLINIC_OR_DEPARTMENT_OTHER): Payer: Self-pay | Admitting: Cardiovascular Disease

## 2023-05-24 DIAGNOSIS — I480 Paroxysmal atrial fibrillation: Secondary | ICD-10-CM

## 2023-05-24 NOTE — Telephone Encounter (Signed)
Prescription refill request for Eliquis received. Indication: AF Last office visit: 10/05/22  Lavell Islam MD Scr: 1.06 on 02/26/23  Epic Age: 73 Weight: 97.3kg  Based on above findings Eliquis 5mg  twice daily is the appropriate dose.  Refill approved.

## 2023-05-26 ENCOUNTER — Telehealth (HOSPITAL_BASED_OUTPATIENT_CLINIC_OR_DEPARTMENT_OTHER): Payer: Self-pay | Admitting: *Deleted

## 2023-05-26 NOTE — Telephone Encounter (Signed)
Patients patient assistance Multaq arrived Left message to call back

## 2023-05-29 NOTE — Telephone Encounter (Signed)
 Left message to call back

## 2023-06-02 NOTE — Telephone Encounter (Signed)
Left message to call back, mychart message sent to patient Multaq ready for pick up

## 2023-06-22 DIAGNOSIS — N200 Calculus of kidney: Secondary | ICD-10-CM | POA: Diagnosis not present

## 2023-06-30 ENCOUNTER — Telehealth (HOSPITAL_BASED_OUTPATIENT_CLINIC_OR_DEPARTMENT_OTHER): Payer: Self-pay | Admitting: Cardiovascular Disease

## 2023-06-30 NOTE — Telephone Encounter (Signed)
 Left message for patient to call back

## 2023-06-30 NOTE — Telephone Encounter (Signed)
 Spoke with patients wife and they will see Lanora Manis PA 3/25, aware [facility] location ED precautions reviewed with wife, no chest pains currently

## 2023-06-30 NOTE — Telephone Encounter (Signed)
   Pt c/o of Chest Pain: STAT if active CP, including tightness, pressure, jaw pain, radiating pain to shoulder/upper arm/back, CP unrelieved by Nitro. Symptoms reported of SOB, nausea, vomiting, sweating.  1. Are you having CP right now? Last night woke up with chest pains- no pain at this time    2. Are you experiencing any other symptoms (ex. SOB, nausea, vomiting, sweating)? No other symptoms    3. Is your CP continuous or coming and going?  Coming and going for awhiles a   4. Have you taken Nitroglycerin? no   5. How long have you been experiencing CP?  Last night, it was the only episode he had. Patient wants to be seen by someone to get this checked out    6. If NO CP at time of call then end call with telling Pt to call back or call 911 if Chest pain returns prior to return call from triage team.

## 2023-07-02 ENCOUNTER — Encounter (HOSPITAL_BASED_OUTPATIENT_CLINIC_OR_DEPARTMENT_OTHER): Payer: Self-pay

## 2023-07-02 ENCOUNTER — Emergency Department (HOSPITAL_BASED_OUTPATIENT_CLINIC_OR_DEPARTMENT_OTHER)
Admission: EM | Admit: 2023-07-02 | Discharge: 2023-07-02 | Disposition: A | Discharge status: Home / Self Care | Attending: Emergency Medicine | Admitting: Emergency Medicine

## 2023-07-02 ENCOUNTER — Other Ambulatory Visit: Payer: Self-pay

## 2023-07-02 ENCOUNTER — Telehealth: Payer: Self-pay | Admitting: Physician Assistant

## 2023-07-02 ENCOUNTER — Emergency Department (HOSPITAL_BASED_OUTPATIENT_CLINIC_OR_DEPARTMENT_OTHER): Admitting: Radiology

## 2023-07-02 ENCOUNTER — Emergency Department (HOSPITAL_BASED_OUTPATIENT_CLINIC_OR_DEPARTMENT_OTHER)

## 2023-07-02 DIAGNOSIS — Z7901 Long term (current) use of anticoagulants: Secondary | ICD-10-CM | POA: Diagnosis not present

## 2023-07-02 DIAGNOSIS — N189 Chronic kidney disease, unspecified: Secondary | ICD-10-CM | POA: Diagnosis not present

## 2023-07-02 DIAGNOSIS — I130 Hypertensive heart and chronic kidney disease with heart failure and stage 1 through stage 4 chronic kidney disease, or unspecified chronic kidney disease: Secondary | ICD-10-CM | POA: Insufficient documentation

## 2023-07-02 DIAGNOSIS — Z79899 Other long term (current) drug therapy: Secondary | ICD-10-CM | POA: Insufficient documentation

## 2023-07-02 DIAGNOSIS — I4891 Unspecified atrial fibrillation: Secondary | ICD-10-CM | POA: Diagnosis not present

## 2023-07-02 DIAGNOSIS — R079 Chest pain, unspecified: Secondary | ICD-10-CM

## 2023-07-02 DIAGNOSIS — I509 Heart failure, unspecified: Secondary | ICD-10-CM | POA: Insufficient documentation

## 2023-07-02 DIAGNOSIS — R0789 Other chest pain: Secondary | ICD-10-CM | POA: Insufficient documentation

## 2023-07-02 LAB — BASIC METABOLIC PANEL
Anion gap: 11 (ref 5–15)
BUN: 21 mg/dL (ref 8–23)
CO2: 24 mmol/L (ref 22–32)
Calcium: 8.9 mg/dL (ref 8.9–10.3)
Chloride: 108 mmol/L (ref 98–111)
Creatinine, Ser: 1.38 mg/dL — ABNORMAL HIGH (ref 0.61–1.24)
GFR, Estimated: 54 mL/min — ABNORMAL LOW (ref >60)
Glucose, Bld: 177 mg/dL — ABNORMAL HIGH (ref 70–99)
Potassium: 3.7 mmol/L (ref 3.5–5.1)
Sodium: 143 mmol/L (ref 135–145)

## 2023-07-02 LAB — TROPONIN I (HIGH SENSITIVITY)
Troponin I (High Sensitivity): 15 ng/L (ref <18)
Troponin I (High Sensitivity): 13 ng/L (ref <18)

## 2023-07-02 LAB — CBC
HCT: 41.7 % (ref 39.0–52.0)
Hemoglobin: 13.8 g/dL (ref 13.0–17.0)
MCH: 29.6 pg (ref 26.0–34.0)
MCHC: 33.1 g/dL (ref 30.0–36.0)
MCV: 89.5 fL (ref 80.0–100.0)
Platelets: 201 10*3/uL (ref 150–400)
RBC: 4.66 MIL/uL (ref 4.22–5.81)
RDW: 15.1 % (ref 11.5–15.5)
WBC: 6.5 10*3/uL (ref 4.0–10.5)
nRBC: 0 % (ref 0.0–0.2)

## 2023-07-02 MED ORDER — NITROGLYCERIN 0.4 MG SL SUBL
0.4000 mg | SUBLINGUAL_TABLET | Freq: Once | SUBLINGUAL | Status: AC
  Administered 2023-07-02: 0.4 mg via SUBLINGUAL
  Filled 2023-07-02: qty 1

## 2023-07-02 NOTE — Telephone Encounter (Signed)
 [name] [name] is a 73 y.o. male with PAF. His wife called the answering service Kendal Hymen - DPR on file). Pt called last week with symptoms of chest pain. He was set up for an appt tomorrow. He had recurrent chest pain today at rest that lasted an hour. Pain described as heaviness. He had no associated symptoms. He eventually took NTG and pain resolved. He has no hx of CAD. Prior CT did show coronary Ca2+. Given prolonged symptoms today with no clear diagnosis in the past of obstructive CAD, I have recommended he go to the ED for evaluation. If hsTroponins are neg, he could be seen as an OP as planned tomorrow. If hsTroponins abnormal, he would likely need IP eval with cardiac catheterization or CCTA.  He agrees with this plan. Tereso Newcomer, PA-C    07/02/2023 2:37 PM

## 2023-07-02 NOTE — ED Notes (Signed)
 Unable to print sunquest labels. Reports lab in process but lab not in chart after calling lab.

## 2023-07-02 NOTE — Discharge Instructions (Addendum)
 You were seen in the emergency department for vomiting.  Your workup today showed no signs of heart attack or stress on your heart.  You should follow-up with your cardiologist on Tuesday as scheduled for further workup and evaluation.  You should return to the emergency department if you are having recurrent severe chest pain, shortness of breath, break out into a sweat or if you have any other new or concerning symptoms.

## 2023-07-02 NOTE — ED Provider Notes (Signed)
 [place] EMERGENCY DEPARTMENT AT [facility] Provider Note   CSN: [id number] Arrival date & time: 07/02/23  1533     History  Chief Complaint  Patient presents with   Chest Pain    [name] [name] is a 73 y.o. male.  Patient is a 73 year old male with a past medical history of A-fib on Eliquis, hypertension, CKD, CHF presenting to the emergency department for chest pain.  The patient states that he woke up from his sleep Thursday night with severe chest pain.  He states that it was a sharp and heavy type of pain in his mid to left chest.  He states that it lasted for at least an hour before resolving on its own.  He called his doctor in the morning who scheduled him a follow-up appointment for Tuesday.  He states that this morning he was at church walking into church when the chest pain returned.  He states that he took a nitro this morning with improvement of the chest pain.  He states and shortly after the pain came back and has been coming and going throughout the day.  He states he called his doctor back who recommended he be evaluated in the ER.  He denies any associated shortness of breath, nausea, vomiting or diaphoresis.  He states that the pain is nonradiating.  Denies any fever or cough.  States he has been compliant on his Eliquis.  The history is provided by the patient and the spouse.       Home Medications Prior to Admission medications   Medication Sig Start Date End Date Taking? Authorizing Provider  acetaminophen (TYLENOL) 500 MG tablet Take 1,000 mg by mouth every 6 (six) hours as needed for moderate pain or headache.    [provider]  albuterol (VENTOLIN HFA) 108 (90 Base) MCG/ACT inhaler Inhale 2 puffs into the lungs every 6 (six) hours as needed for wheezing or shortness of breath. 02/27/23   Almon Hercules, MD  apixaban (ELIQUIS) 5 MG TABS tablet Take 1 tablet by mouth twice daily 05/24/23   Chilton Si, MD  carvedilol (COREG) 12.5 MG  tablet TAKE 3 TABLETS TWICE A DAY 03/28/23   Chilton Si, MD  dronedarone (MULTAQ) 400 MG tablet Take 1 tablet (400 mg total) by mouth 2 (two) times daily with a meal. 03/24/22   Chilton Si, MD  Dulaglutide (TRULICITY) 1.5 MG/0.5ML SOPN Inject 1.5 mg into the skin once a week. 05/19/22     DULoxetine (CYMBALTA) 60 MG capsule Take 60 mg by mouth daily. 03/27/20   [provider]  FOLIC ACID PO Take 1,333 mcg by mouth daily.    [provider]  Glucosamine HCl 1500 MG TABS Take 1,500 mg by mouth in the morning and at bedtime.    [provider]  JARDIANCE 25 MG TABS tablet Take 25 mg by mouth daily. 06/12/19   [provider]  levothyroxine (SYNTHROID) 175 MCG tablet Take 175 mcg by mouth daily. 07/01/19   [provider]  losartan (COZAAR) 50 MG tablet TAKE 1 TABLET EVERY EVENING 05/03/23   Chilton Si, MD  Meclizine HCl (BONINE PO) Take 1 tablet by mouth daily as needed (nausea).    [provider]  metFORMIN (GLUCOPHAGE-XR) 500 MG 24 hr tablet Take 1,000 mg by mouth 2 (two) times daily. 07/15/19   [provider]  Multiple Vitamin (MULTIVITAMIN WITH MINERALS) TABS tablet Take 1 tablet by mouth daily. One-A-Day for Men    [provider]  Multiple Vitamins-Minerals (PRESERVISION AREDS 2) CAPS Take 1 capsule by mouth 2 (two) times daily.    [provider]  omeprazole (PRILOSEC) 40 MG capsule Take 40 mg by mouth daily.    [provider]  polycarbophil (FIBERCON) 625 MG tablet Take 1,250 mg by mouth every evening.    [provider]  Probiotic Product (PROBIOTIC PO) Take 1 capsule by mouth at bedtime.    [provider]  rosuvastatin (CRESTOR) 10 MG tablet Take 1 tablet (10 mg total) by mouth daily. 03/15/22   Chilton Si, MD  vitamin B-12 (CYANOCOBALAMIN) 1000 MCG tablet Take 1,000 mcg by mouth daily.    [provider]  Vitamin D, Ergocalciferol, (DRISDOL) 1.25 MG  (50000 UNIT) CAPS capsule Take 50,000 Units by mouth every 7 (seven) days. Tuesdays 01/04/22   [provider]      Allergies    Bystolic [nebivolol hcl], Calcium channel blockers, Feraheme [ferumoxytol], Phenergan [promethazine hcl], Beta adrenergic blockers, Cardizem [diltiazem], Norvasc [amlodipine], Aspirin, Nsaids, and Prednisone    Review of Systems   Review of Systems  Physical Exam Updated Vital Signs BP (!) 153/85   Pulse 61   Temp 98 F (36.7 C) (Temporal)   Resp 19   Ht 5\' 7"  (1.702 m)   Wt 99.3 kg   SpO2 96%   BMI 34.30 kg/m  Physical Exam Vitals and nursing note reviewed.  Constitutional:      General: He is not in acute distress.    Appearance: Normal appearance.  HENT:     Head: Normocephalic and atraumatic.     Nose: Nose normal.     Mouth/Throat:     Mouth: Mucous membranes are moist.  Eyes:     Extraocular Movements: Extraocular movements intact.     Conjunctiva/sclera: Conjunctivae normal.  Cardiovascular:     Rate and Rhythm: Normal rate and regular rhythm.     Pulses: Normal pulses.     Heart sounds: Normal heart sounds.  Pulmonary:     Effort: Pulmonary effort is normal.     Breath sounds: Normal breath sounds.  Abdominal:     General: Abdomen is flat.     Palpations: Abdomen is soft.     Tenderness: There is no abdominal tenderness.  Musculoskeletal:        General: Normal range of motion.     Cervical back: Normal range of motion.  Skin:    General: Skin is warm and dry.  Neurological:     General: No focal deficit present.     Mental Status: He is alert and oriented to person, place, and time.  Psychiatric:        Mood and Affect: Mood normal.        Behavior: Behavior normal.     ED Results / Procedures / Treatments   Labs (all labs ordered are listed, but only abnormal results are displayed) Labs Reviewed  BASIC METABOLIC PANEL - Abnormal; Notable for the following components:      Result Value   Glucose, Bld 177 (*)     Creatinine, Ser 1.38 (*)    GFR, Estimated 54 (*)    All other components within normal limits  CBC  TROPONIN I (HIGH SENSITIVITY)  TROPONIN I (HIGH SENSITIVITY)    EKG EKG Interpretation Date/Time:  Sunday July 02 2023 15:42:54 EDT Ventricular Rate:  74 PR Interval:  212 QRS Duration:  110 QT Interval:  436 QTC Calculation: 483 R Axis:   -36  Text Interpretation: Sinus rhythm with 1st degree A-V block with occasional Premature ventricular complexes Left axis deviation Moderate voltage criteria for LVH, may be normal variant ( R in aVL , Cornell product ) Prolonged QT Abnormal ECG No significant change since last tracing Confirmed by Elayne Snare ([id number]) on 07/02/2023 4:07:49 PM  Radiology DG Chest Portable 1 View Result Date: 07/02/2023 CLINICAL DATA:  Chest pain. EXAM: PORTABLE CHEST 1 VIEW COMPARISON:  Lymph 1624 and CT chest 01/08/2011. FINDINGS: Trachea is midline. Heart size stable. Lungs are clear. No pleural fluid. IMPRESSION: No acute findings. Electronically Signed   By: Leanna Battles M.D.   On: 07/02/2023 16:27    Procedures Procedures    Medications Ordered in ED Medications  nitroGLYCERIN (NITROSTAT) SL tablet 0.4 mg (0.4 mg Sublingual Given 07/02/23 1555)    ED Course/ Medical Decision Making/ A&P Clinical Course as of 07/02/23 2033  Sun Jul 02, 2023  1815 Cr mildly increased though seems approximately at baseline. Initial troponin negative. With high risk CP will be recommended repeat troponin. [VK]  [id number] Patient is now chest pain free, rpt troponin pending. [VK]  1953 Rpt troponin negative, patient remains CP free. He has cardiology f/u tomorrow and per cardiology APP note is ok to follow up tomorrow if troponins negative here.  [VK]    Clinical Course User Index [VK] Rexford Maus, DO                                 Medical Decision Making This patient presents to the ED with chief complaint(s) of chest pain with pertinent past medical  history of hypertension, diabetes, CKD, CHF, A-fib on Eliquis which further complicates the presenting complaint. The complaint involves an extensive differential diagnosis and also carries with it a high risk of complications and morbidity.    The differential diagnosis includes ACS, arrhythmia, anemia, pneumonia, pneumothorax, pulmonary edema, pleural effusion, low suspicion for PE as he has been compliant on his Eliquis and is not tachycardic, tachypneic or hypoxic here  Additional history obtained: Additional history obtained from family Records reviewed outpatient cardiology records  ED Course and Reassessment: On patient's arrival he is hemodynamically stable in no acute distress.  EKG on arrival showed normal sinus rhythm with PVCs and no acute ischemic changes.  The patient was given nitro in triage and states that his chest pain did improve from about a 9 out of 10 to a 7 out of 10, declined any additional pain control at this time.  Will of labs including troponin and will be closely reassessed.  Independent labs interpretation:  The following labs were independently interpreted: labs at baseline, no acute abnormality  Independent visualization of imaging: - I independently visualized the following imaging with scope of interpretation limited to determining acute life threatening conditions related to emergency care: CXR, which revealed no acute disease  Consultation: - Consulted or discussed management/test interpretation w/ external professional: N/A  Consideration for admission or further workup: Patient has no emergent conditions requiring admission or further work-up at this time and is stable for discharge home with primary care follow-up  Social Determinants of health: N/A    Amount and/or Complexity of Data Reviewed Labs: ordered. Radiology: ordered.  Risk Prescription drug management.          Final Clinical Impression(s) / ED Diagnoses Final diagnoses:   Nonspecific chest pain    Rx / DC Orders ED Discharge Orders  None         Rexford Maus, [place] 07/02/23 2033

## 2023-07-02 NOTE — ED Triage Notes (Signed)
 Episodes of chest pain since Thursday. Took nitroglycerin today and CP was relieved for about an hour. CP returning in triage. Central pain non-radiating. -N/-V/-D, -SOB.

## 2023-07-04 ENCOUNTER — Encounter (HOSPITAL_COMMUNITY): Payer: Self-pay

## 2023-07-04 ENCOUNTER — Telehealth: Payer: Self-pay | Admitting: Physician Assistant

## 2023-07-04 ENCOUNTER — Observation Stay (HOSPITAL_COMMUNITY)
Admission: AD | Admit: 2023-07-04 | Discharge: 2023-07-06 | Disposition: A | Discharge status: Home / Self Care | Source: Ambulatory Visit | Attending: Cardiology | Admitting: Cardiology

## 2023-07-04 ENCOUNTER — Encounter: Payer: Self-pay | Admitting: Physician Assistant

## 2023-07-04 ENCOUNTER — Other Ambulatory Visit: Payer: Self-pay

## 2023-07-04 ENCOUNTER — Encounter (HOSPITAL_COMMUNITY): Payer: Self-pay | Admitting: Cardiology

## 2023-07-04 ENCOUNTER — Ambulatory Visit: Attending: Physician Assistant | Admitting: Physician Assistant

## 2023-07-04 ENCOUNTER — Other Ambulatory Visit: Payer: Self-pay | Admitting: Physician Assistant

## 2023-07-04 VITALS — BP 132/76 | HR 61 | Ht 67.0 in | Wt 219.0 lb

## 2023-07-04 DIAGNOSIS — I2510 Atherosclerotic heart disease of native coronary artery without angina pectoris: Secondary | ICD-10-CM | POA: Insufficient documentation

## 2023-07-04 DIAGNOSIS — Z79899 Other long term (current) drug therapy: Secondary | ICD-10-CM | POA: Diagnosis not present

## 2023-07-04 DIAGNOSIS — I5042 Chronic combined systolic (congestive) and diastolic (congestive) heart failure: Secondary | ICD-10-CM | POA: Insufficient documentation

## 2023-07-04 DIAGNOSIS — Z8679 Personal history of other diseases of the circulatory system: Secondary | ICD-10-CM

## 2023-07-04 DIAGNOSIS — R079 Chest pain, unspecified: Secondary | ICD-10-CM | POA: Diagnosis not present

## 2023-07-04 DIAGNOSIS — I4892 Unspecified atrial flutter: Secondary | ICD-10-CM | POA: Insufficient documentation

## 2023-07-04 DIAGNOSIS — I130 Hypertensive heart and chronic kidney disease with heart failure and stage 1 through stage 4 chronic kidney disease, or unspecified chronic kidney disease: Secondary | ICD-10-CM | POA: Diagnosis not present

## 2023-07-04 DIAGNOSIS — E118 Type 2 diabetes mellitus with unspecified complications: Secondary | ICD-10-CM

## 2023-07-04 DIAGNOSIS — I152 Hypertension secondary to endocrine disorders: Secondary | ICD-10-CM | POA: Insufficient documentation

## 2023-07-04 DIAGNOSIS — E785 Hyperlipidemia, unspecified: Secondary | ICD-10-CM | POA: Insufficient documentation

## 2023-07-04 DIAGNOSIS — Z8546 Personal history of malignant neoplasm of prostate: Secondary | ICD-10-CM | POA: Insufficient documentation

## 2023-07-04 DIAGNOSIS — Z7984 Long term (current) use of oral hypoglycemic drugs: Secondary | ICD-10-CM | POA: Diagnosis not present

## 2023-07-04 DIAGNOSIS — I480 Paroxysmal atrial fibrillation: Secondary | ICD-10-CM | POA: Insufficient documentation

## 2023-07-04 DIAGNOSIS — I25110 Atherosclerotic heart disease of native coronary artery with unstable angina pectoris: Principal | ICD-10-CM | POA: Insufficient documentation

## 2023-07-04 DIAGNOSIS — Z7985 Long-term (current) use of injectable non-insulin antidiabetic drugs: Secondary | ICD-10-CM | POA: Insufficient documentation

## 2023-07-04 DIAGNOSIS — N183 Chronic kidney disease, stage 3 unspecified: Secondary | ICD-10-CM | POA: Insufficient documentation

## 2023-07-04 DIAGNOSIS — Z9889 Other specified postprocedural states: Secondary | ICD-10-CM

## 2023-07-04 DIAGNOSIS — I10 Essential (primary) hypertension: Secondary | ICD-10-CM | POA: Diagnosis present

## 2023-07-04 DIAGNOSIS — E1169 Type 2 diabetes mellitus with other specified complication: Secondary | ICD-10-CM | POA: Diagnosis present

## 2023-07-04 DIAGNOSIS — E1122 Type 2 diabetes mellitus with diabetic chronic kidney disease: Secondary | ICD-10-CM | POA: Diagnosis not present

## 2023-07-04 DIAGNOSIS — E039 Hypothyroidism, unspecified: Secondary | ICD-10-CM | POA: Diagnosis present

## 2023-07-04 DIAGNOSIS — Z7901 Long term (current) use of anticoagulants: Secondary | ICD-10-CM | POA: Diagnosis not present

## 2023-07-04 DIAGNOSIS — I200 Unstable angina: Secondary | ICD-10-CM | POA: Diagnosis not present

## 2023-07-04 DIAGNOSIS — E1159 Type 2 diabetes mellitus with other circulatory complications: Secondary | ICD-10-CM | POA: Insufficient documentation

## 2023-07-04 DIAGNOSIS — E119 Type 2 diabetes mellitus without complications: Secondary | ICD-10-CM | POA: Insufficient documentation

## 2023-07-04 LAB — CBC WITH DIFFERENTIAL/PLATELET
Abs Immature Granulocytes: 0.06 10*3/uL (ref 0.00–0.07)
Basophils Absolute: 0.1 10*3/uL (ref 0.0–0.1)
Basophils Relative: 1 %
Eosinophils Absolute: 0.3 10*3/uL (ref 0.0–0.5)
Eosinophils Relative: 5 %
HCT: 41.3 % (ref 39.0–52.0)
Hemoglobin: 13.7 g/dL (ref 13.0–17.0)
Immature Granulocytes: 1 %
Lymphocytes Relative: 26 %
Lymphs Abs: 1.7 10*3/uL (ref 0.7–4.0)
MCH: 30.1 pg (ref 26.0–34.0)
MCHC: 33.2 g/dL (ref 30.0–36.0)
MCV: 90.8 fL (ref 80.0–100.0)
Monocytes Absolute: 0.7 10*3/uL (ref 0.1–1.0)
Monocytes Relative: 11 %
Neutro Abs: 3.8 10*3/uL (ref 1.7–7.7)
Neutrophils Relative %: 56 %
Platelets: 197 10*3/uL (ref 150–400)
RBC: 4.55 MIL/uL (ref 4.22–5.81)
RDW: 15.1 % (ref 11.5–15.5)
WBC: 6.7 10*3/uL (ref 4.0–10.5)
nRBC: 0 % (ref 0.0–0.2)

## 2023-07-04 LAB — COMPREHENSIVE METABOLIC PANEL
ALT: 20 U/L (ref 0–44)
AST: 21 U/L (ref 15–41)
Albumin: 3.5 g/dL (ref 3.5–5.0)
Alkaline Phosphatase: 72 U/L (ref 38–126)
Anion gap: 6 (ref 5–15)
BUN: 23 mg/dL (ref 8–23)
CO2: 22 mmol/L (ref 22–32)
Calcium: 9.5 mg/dL (ref 8.9–10.3)
Chloride: 111 mmol/L (ref 98–111)
Creatinine, Ser: 1.3 mg/dL — ABNORMAL HIGH (ref 0.61–1.24)
GFR, Estimated: 58 mL/min — ABNORMAL LOW (ref >60)
Glucose, Bld: 147 mg/dL — ABNORMAL HIGH (ref 70–99)
Potassium: 3.9 mmol/L (ref 3.5–5.1)
Sodium: 139 mmol/L (ref 135–145)
Total Bilirubin: 0.8 mg/dL (ref 0.0–1.2)
Total Protein: 6.2 g/dL — ABNORMAL LOW (ref 6.5–8.1)

## 2023-07-04 LAB — TROPONIN I (HIGH SENSITIVITY)
Troponin I (High Sensitivity): 14 ng/L (ref <18)
Troponin I (High Sensitivity): 15 ng/L (ref <18)

## 2023-07-04 MED ORDER — MORPHINE SULFATE (PF) 2 MG/ML IV SOLN: 2.0000 mg | Freq: Once | INTRAVENOUS | Status: AC

## 2023-07-04 MED ORDER — DRONEDARONE HCL 400 MG PO TABS
400.0000 mg | ORAL_TABLET | Freq: Two times a day (BID) | ORAL | Status: DC
  Administered 2023-07-04 – 2023-07-06 (×4): 400 mg via ORAL
  Filled 2023-07-04 (×4): qty 1

## 2023-07-04 MED ORDER — CARVEDILOL 25 MG PO TABS
37.5000 mg | ORAL_TABLET | Freq: Two times a day (BID) | ORAL | Status: DC
  Administered 2023-07-04 – 2023-07-05 (×3): 37.5 mg via ORAL
  Filled 2023-07-04 (×3): qty 1

## 2023-07-04 MED ORDER — OXYCODONE-ACETAMINOPHEN 5-325 MG PO TABS
1.0000 | ORAL_TABLET | Freq: Four times a day (QID) | ORAL | Status: DC | PRN
Start: 2023-07-04 — End: 2023-07-06
  Administered 2023-07-05: 2 via ORAL
  Administered 2023-07-05 (×2): 1 via ORAL
  Administered 2023-07-06: 2 via ORAL
  Filled 2023-07-04 (×2): qty 2
  Filled 2023-07-04: qty 1
  Filled 2023-07-04: qty 2

## 2023-07-04 MED ORDER — ACETAMINOPHEN 325 MG PO TABS
650.0000 mg | ORAL_TABLET | ORAL | Status: DC | PRN
  Administered 2023-07-04 – 2023-07-05 (×3): 650 mg via ORAL
  Filled 2023-07-04 (×3): qty 2

## 2023-07-04 MED ORDER — LEVOTHYROXINE SODIUM 75 MCG PO TABS
175.0000 ug | ORAL_TABLET | Freq: Every day | ORAL | Status: DC
  Administered 2023-07-05 – 2023-07-06 (×2): 175 ug via ORAL
  Filled 2023-07-04 (×2): qty 1

## 2023-07-04 MED ORDER — ONDANSETRON HCL 4 MG/2ML IJ SOLN
4.0000 mg | Freq: Four times a day (QID) | INTRAMUSCULAR | Status: DC | PRN
  Administered 2023-07-05 – 2023-07-06 (×2): 4 mg via INTRAVENOUS
  Filled 2023-07-04 (×3): qty 2

## 2023-07-04 MED ORDER — NITROGLYCERIN 0.4 MG SL SUBL
0.4000 mg | SUBLINGUAL_TABLET | SUBLINGUAL | Status: DC | PRN
  Administered 2023-07-04 – 2023-07-05 (×4): 0.4 mg via SUBLINGUAL
  Filled 2023-07-04 (×3): qty 1

## 2023-07-04 MED ORDER — HEPARIN SODIUM (PORCINE) 5000 UNIT/ML IJ SOLN
5000.0000 [IU] | Freq: Three times a day (TID) | INTRAMUSCULAR | Status: DC
  Administered 2023-07-04 – 2023-07-05 (×3): 5000 [IU] via SUBCUTANEOUS
  Filled 2023-07-04 (×3): qty 1

## 2023-07-04 MED ORDER — NITROGLYCERIN 0.4 MG SL SUBL
SUBLINGUAL_TABLET | SUBLINGUAL | Status: AC
Start: 2023-07-04 — End: 2023-07-04
  Administered 2023-07-04: 0.4 mg via SUBLINGUAL
  Filled 2023-07-04: qty 1

## 2023-07-04 MED ORDER — ASPIRIN 81 MG PO CHEW
324.0000 mg | CHEWABLE_TABLET | ORAL | Status: AC
  Administered 2023-07-04: 324 mg via ORAL
  Filled 2023-07-04: qty 4

## 2023-07-04 MED ORDER — NITROGLYCERIN IN D5W 200-5 MCG/ML-% IV SOLN
INTRAVENOUS | Status: AC
  Filled 2023-07-04: qty 250

## 2023-07-04 MED ORDER — ROSUVASTATIN CALCIUM 5 MG PO TABS
10.0000 mg | ORAL_TABLET | Freq: Every day | ORAL | Status: DC
  Administered 2023-07-05: 10 mg via ORAL
  Filled 2023-07-04: qty 2

## 2023-07-04 MED ORDER — MORPHINE SULFATE (PF) 2 MG/ML IV SOLN
INTRAVENOUS | Status: AC
  Administered 2023-07-04: 2 mg via INTRAVENOUS
  Filled 2023-07-04: qty 1

## 2023-07-04 MED ORDER — PANTOPRAZOLE SODIUM 40 MG PO TBEC
40.0000 mg | DELAYED_RELEASE_TABLET | Freq: Every day | ORAL | Status: DC
  Administered 2023-07-05: 40 mg via ORAL
  Filled 2023-07-04: qty 1

## 2023-07-04 MED ORDER — ASPIRIN 81 MG PO TBEC
81.0000 mg | DELAYED_RELEASE_TABLET | Freq: Every day | ORAL | Status: DC
  Filled 2023-07-04: qty 1

## 2023-07-04 MED ORDER — ASPIRIN 300 MG RE SUPP
300.0000 mg | RECTAL | Status: AC
  Filled 2023-07-04: qty 1

## 2023-07-04 MED ORDER — LOSARTAN POTASSIUM 50 MG PO TABS
50.0000 mg | ORAL_TABLET | Freq: Every evening | ORAL | Status: DC
  Administered 2023-07-04 – 2023-07-05 (×2): 50 mg via ORAL
  Filled 2023-07-04 (×2): qty 1

## 2023-07-04 MED ORDER — ORAL CARE MOUTH RINSE: 15.0000 mL | OROMUCOSAL | Status: DC | PRN

## 2023-07-04 MED ORDER — CARVEDILOL 25 MG PO TABS: 25.0000 mg | ORAL_TABLET | Freq: Two times a day (BID) | ORAL | Status: DC

## 2023-07-04 NOTE — Telephone Encounter (Signed)
 Pt spouse called in stating they were told pt needs to be admitted into hospital but they asked how long would it take. Please advise.

## 2023-07-04 NOTE — Progress Notes (Signed)
 Arrived to [facility] 6E as a direct admit from Cardiology Office.  Placed on cardiac monitor and vital signs obtained.  Patient came into room complaining of 8/10 chest pain. SL nitroglycerin given as ordered.  Donnella Bi PA and P. Ross MD at bedside at this time while episode of chest pain occurring.   Multiple IV attempts by nursing staff, IV team consult placed due to urgent nature of clinical state.  After 2 doses of SL nitroglycerin, chest pain now rated at 5/10.  IV team RN at bedside to assist with IV access.     07/04/23 1522  Vitals  Temp 97.7 F (36.5 C)  Temp Source Oral  BP (!) 153/66  MAP (mmHg) 89  BP Location Left Arm  BP Method Automatic  Patient Position (if appropriate) Lying  Pulse Rate 64  Pulse Rate Source Monitor  ECG Heart Rate 63  Resp 19  MEWS COLOR  MEWS Score Color Green  Oxygen Therapy  SpO2 97 %  O2 Device Room Air  Pain Assessment  Pain Scale 0-10  Pain Score 8  Pain Location Chest  Pain Orientation Left  Pain Intervention(s) Medication (See eMAR) (Sublingual nitro given)  MEWS Score  MEWS Temp 0  MEWS Systolic 0  MEWS Pulse 0  MEWS RR 0  MEWS LOC 0  MEWS Score 0

## 2023-07-04 NOTE — Telephone Encounter (Signed)
 I have called bed control, they says they have a bed and waiting for it to be cleaned before calling the patient. It should be ready within the hour. I have called and spoke with wife, she is aware and thankful for the update.

## 2023-07-04 NOTE — Plan of Care (Signed)

## 2023-07-04 NOTE — Progress Notes (Signed)
 Followed up with staff, patient required morphine and chest pain resolved. Labs reviewed, Hs trop negative x1. EKG with resolved inferolateral TWI. No indication for escalation of care at this time.

## 2023-07-04 NOTE — H&P (Signed)
 Cardiology Admission History and Physical   Patient ID: [name] [name] MRN: [id number]; DOB: 1951-03-23   Admission date: (Not on file)  PCP:  Garlan Fillers, MD   [facility] Providers Cardiologist:  Chilton Si, MD        Chief Complaint:  chest pain  Patient Profile:   [name] [name] is a 73 y.o. male with atrial fibrillation s/p ablation x2 [facility], most recent 2016), HFpEF, HTN, HLD, DM II and prior prostate cancer  who is being seen 07/04/2023 for the evaluation of chest pain.  History of Present Illness:   [name] [name]  is a 73 y.o. male with PMH of atrial fibrillation s/p ablation x2 [facility], most recent 2016), HFpEF, HTN, HLD, DM II and prior prostate cancer.  Most recent ablation at [facility] was in 2016, this was complicated by cardiogenic shock required 4 days in the ICU and intubation.  He was seen in December 2021 for recurrent atrial flutter after surgery.  He underwent TEE DCCV in 03/2020.  He was started back on 2 [place].  He had a recurrent rate controlled atrial fibrillation in February 2022 triggered by UTI.  He underwent successful cardioversion in February 2022.  He was admitted with symptomatic anemia secondary to upper GI bleeding in 2023.  EGD revealed nonbleeding gastric ulcer which was treated with epi injection and 3 clips.  The Xarelto was held for 5 days postprocedure.  Xarelto was later switched to Eliquis in December 2023.  He was not interested in [place] procedure.  He was last seen by Dr. Duke Salvia in June 2024 at which time he was doing well and maintaining sinus rhythm.  Since the last visit, patient was admitted with acute bronchitis in November 2024.  Viral panel positive for rhinovirus.  She was treated with Solu-Medrol.  BNP only 161.  Although discharge note mention patient was in A-fib, however EKG was personally viewed by myself and showed sinus rhythm.  More recently, patient went to the emergency room on 07/02/2023 after waking up with  chest pain.  Symptoms was substernal and lasted about an hour before resolving by self.  He called cardiology service to schedule appointment, however by Sunday morning he had another episode of chest pain walking to the charge.  This prompted him to seek urgent medical attention in the emergency room.  He had a third episode of chest pain while in the emergency room.  Chest x-ray was normal.  Basic metabolic panel showed a creatinine of 1.38, no significant electrolyte abnormality.  Serial troponin 15--> 13.  Chest pain slightly worse with deep inspiration.   He presents today for evaluation of chest discomfort.  He has not had any more chest discomfort since Sunday.  However EKG obtained in the office today showed dramatic changes when compared to Sunday's EKG.  He is in sinus rhythm with significant T wave inversion in the inferolateral leads with minimal ST depression.  There was no ST elevation.  EKG was repeated which continue to show T wave inversion however no significant ST depression.  Patient was seen together with DOD Dr. Jens Som, we verified with the patient that he has not had any chest pain since Sunday.  His last dose of Eliquis was this morning around 7 AM.  Given recurrent chest discomfort, we recommend direct admission to cardiology service and plan for cardiac catheterization tomorrow after holding Eliquis.  Risk and benefit of the cardiac authorization has been explained to the patient who is agreeable  to proceed.     Past Medical History:  Diagnosis Date   Arthritis    OA   Atrial flutter (HCC)    a. s/p RFCA 8/13   Cancer (HCC) 2010   prostate   Chronic combined systolic and diastolic CHF (congestive heart failure) (HCC)    a. LVEF previously 35% felt to be due tachycardia;  b. 02/2013 Echo: EF 50-55%, no rwma, mildly dil LA/RA, mild to mod MR. C 11/2014 echo - ef 60-65%, unable to determine DD   CKD (chronic kidney disease), stage III (HCC)    hx of a/c renal failure during  episode of diverticulitis 9/13 in [place]   Diabetes mellitus (HCC)    TYPE 2    Diverticulitis    Diverticulosis    Dyslipidemia    Dysrhythmia    Erectile dysfunction    Gastric ulcer    s/p prior surgery   GERD (gastroesophageal reflux disease)    Headache    History of colonic diverticulitis    History of prostate cancer    Hypertension    Hypothyroid    Kidney stones    Microcytic anemia    Obesities, morbid (HCC)    Obstructive sleep apnea    noncompliant with CPAP   Osteoporosis    PAF (paroxysmal atrial fibrillation) (HCC)    a. failed DCCV and multiple anti-arrhythmic drugs (multaq/flecainide);   b. s/p  PVI isolation with ablation of AFib 8/13; c. repeat PVI 01/2013;  d. 02/2013 repeat DCCV->Amio load/xarelto.   Peptic ulcer disease 1994   Renal calculi    Sleep apnea 2015   has not had sleep apnea since heart intervention   Thrombocytopenia (HCC) 1992   idopathic-treated with danazol   Tubular adenoma of colon     Past Surgical History:  Procedure Laterality Date   ATRIAL FIBRILLATION ABLATION  12/06/11; 02/05/2013   Afib and atrial flutter ablation by Dr Johney Frame; repeat PVI by Dr Johney Frame 02/05/2013   ATRIAL FIBRILLATION ABLATION N/A 12/06/2011   Procedure: ATRIAL FIBRILLATION ABLATION;  Surgeon: Hillis Range, MD;  Location: [facility] CATH LAB;  Service: Cardiovascular;  Laterality: N/A;   ATRIAL FIBRILLATION ABLATION N/A 02/05/2013   Procedure: ATRIAL FIBRILLATION ABLATION;  Surgeon: Gardiner Rhyme, MD;  Location: [facility] CATH LAB;  Service: Cardiovascular;  Laterality: N/A;   benign stomach tumor removal  2000   CARDIOVERSION  08/25/2011   Procedure: CARDIOVERSION;  Surgeon: Corky Crafts, MD;  Location: [facility] ENDOSCOPY;  Service: Cardiovascular;  Laterality: N/A;   CARDIOVERSION N/A 03/05/2013   Procedure: CARDIOVERSION;  Surgeon: Lesleigh Noe, MD;  Location: [facility] OR;  Service: Cardiovascular;  Laterality: N/A;  BEDSIDE    CARDIOVERSION N/A 03/25/2020    Procedure: CARDIOVERSION;  Surgeon: Chilton Si, MD;  Location: [facility] ENDOSCOPY;  Service: Cardiovascular;  Laterality: N/A;   CARDIOVERSION N/A 06/02/2020   Procedure: CARDIOVERSION;  Surgeon: Chrystie Nose, MD;  Location: [facility] ENDOSCOPY;  Service: Cardiovascular;  Laterality: N/A;   CARPAL TUNNEL WITH CUBITAL TUNNEL Right 02/25/2020   Procedure: RIGHT CARPAL TUNNEL RELEASE, CUBITAL TUNNEL RELEASE IN SITU, RIGHT WRIST PROXIMAL ROW CARPECTOMY AND PROXIMAL CAPITATE HEAD REPLACEMENT/HEMIARTHROPLASTY WITH POSTERIOR INTEROSSOUS NERVE NEURECTOMY AND REPAIR;  Surgeon: Dominica Severin, MD;  Location: [facility] OR;  Service: Orthopedics;  Laterality: Right;  2.5 hrs Block with IV Sedation   convergent afib abaltion [facility] 02/26/15  02/26/2015   [facility] by Dr. Hurman Horn and Dr. Sindy Guadeloupe   ELECTROPHYSIOLOGIC STUDY N/A 11/24/2014   Procedure: Cardioversion;  Surgeon: Will Daphine Deutscher  Camnitz, MD;  Location: [facility] INVASIVE CV LAB;  Service: Cardiovascular;  Laterality: N/A;   ESOPHAGOGASTRODUODENOSCOPY N/A 04/09/2013   Procedure: ESOPHAGOGASTRODUODENOSCOPY (EGD) with possible Balloon Dilation;  Surgeon: Charolett Bumpers, MD;  Location: [facility] ENDOSCOPY;  Service: Endoscopy;  Laterality: N/A;   ESOPHAGOGASTRODUODENOSCOPY (EGD) WITH PROPOFOL N/A 01/24/2022   Procedure: ESOPHAGOGASTRODUODENOSCOPY (EGD) WITH PROPOFOL;  Surgeon: Sherrilyn Rist, MD;  Location: [facility] ENDOSCOPY;  Service: Gastroenterology;  Laterality: N/A;   HEMOSTASIS CLIP PLACEMENT  01/24/2022   Procedure: HEMOSTASIS CLIP PLACEMENT;  Surgeon: Sherrilyn Rist, MD;  Location: [facility] ENDOSCOPY;  Service: Gastroenterology;;   kidney stone removal     multiple   KNEE ARTHROSCOPY Bilateral 1979   LAPAROSCOPIC RETROPUBIC PROSTATECTOMY  02/2007   hx prostate cancer   ORIF ANKLE FRACTURE Right 10/2015   ORIF ANKLE FRACTURE Right 11/03/2015   Procedure: ORIF RIGHT LISFRANK FOOT;  Surgeon: Nadara Mustard, MD;  Location: [facility] OR;  Service: Orthopedics;  Laterality: Right;    SUBMUCOSAL INJECTION  01/24/2022   Procedure: SUBMUCOSAL INJECTION;  Surgeon: Sherrilyn Rist, MD;  Location: [facility] ENDOSCOPY;  Service: Gastroenterology;;   TEE WITHOUT CARDIOVERSION  08/25/2011   Procedure: TRANSESOPHAGEAL ECHOCARDIOGRAM (TEE);  Surgeon: Corky Crafts, MD;  Location: [facility] ENDOSCOPY;  Service: Cardiovascular;  Laterality: N/A;   TEE WITHOUT CARDIOVERSION  11/09/2011   Procedure: TRANSESOPHAGEAL ECHOCARDIOGRAM (TEE);  Surgeon: Corky Crafts, MD;  Location: [facility] ENDOSCOPY;  Service: Cardiovascular;  Laterality: N/A;  h/p in file drawer   TEE WITHOUT CARDIOVERSION N/A 02/05/2013   Procedure: TRANSESOPHAGEAL ECHOCARDIOGRAM (TEE);  Surgeon: Donato Schultz, MD;  Location: [facility] ENDOSCOPY;  Service: Cardiovascular;  Laterality: N/A;   TEE WITHOUT CARDIOVERSION N/A 03/25/2020   Procedure: TRANSESOPHAGEAL ECHOCARDIOGRAM (TEE);  Surgeon: Chilton Si, MD;  Location: [facility] ENDOSCOPY;  Service: Cardiovascular;  Laterality: N/A;   TOTAL THYROIDECTOMY  2009   benign nodules removed   UPPER GASTROINTESTINAL ENDOSCOPY     WRIST SURGERY Right 1977   with placement of lunate prosthesis     Medications Prior to Admission: Prior to Admission medications   Medication Sig Start Date End Date Taking? Authorizing Provider  acetaminophen (TYLENOL) 500 MG tablet Take 1,000 mg by mouth every 6 (six) hours as needed for moderate pain or headache.    [provider]  albuterol (VENTOLIN HFA) 108 (90 Base) MCG/ACT inhaler Inhale 2 puffs into the lungs every 6 (six) hours as needed for wheezing or shortness of breath. 02/27/23   Almon Hercules, MD  apixaban (ELIQUIS) 5 MG TABS tablet Take 1 tablet by mouth twice daily 05/24/23   Chilton Si, MD  carvedilol (COREG) 12.5 MG tablet TAKE 3 TABLETS TWICE A DAY 03/28/23   Chilton Si, MD  dronedarone (MULTAQ) 400 MG tablet Take 1 tablet (400 mg total) by mouth 2 (two) times daily with a meal. 03/24/22   Chilton Si, MD   Dulaglutide (TRULICITY) 1.5 MG/0.5ML SOPN Inject 1.5 mg into the skin once a week. 05/19/22     DULoxetine (CYMBALTA) 60 MG capsule Take 60 mg by mouth daily. 03/27/20   [provider]  FOLIC ACID PO Take 1,333 mcg by mouth daily.    [provider]  Glucosamine HCl 1500 MG TABS Take 1,500 mg by mouth in the morning and at bedtime.    [provider]  JARDIANCE 25 MG TABS tablet Take 25 mg by mouth daily. 06/12/19   [provider]  levothyroxine (SYNTHROID) 175 MCG tablet Take 175 mcg by mouth  daily. 07/01/19   [provider]  losartan (COZAAR) 50 MG tablet TAKE 1 TABLET EVERY EVENING 05/03/23   Chilton Si, MD  Meclizine HCl (BONINE PO) Take 1 tablet by mouth daily as needed (nausea).    [provider]  metFORMIN (GLUCOPHAGE-XR) 500 MG 24 hr tablet Take 1,000 mg by mouth 2 (two) times daily. 07/15/19   [provider]  Multiple Vitamin (MULTIVITAMIN WITH MINERALS) TABS tablet Take 1 tablet by mouth daily. One-A-Day for Men    [provider]  Multiple Vitamins-Minerals (PRESERVISION AREDS 2) CAPS Take 1 capsule by mouth 2 (two) times daily.    [provider]  omeprazole (PRILOSEC) 40 MG capsule Take 40 mg by mouth daily.    [provider]  polycarbophil (FIBERCON) 625 MG tablet Take 1,250 mg by mouth every evening.    [provider]  Probiotic Product (PROBIOTIC PO) Take 1 capsule by mouth at bedtime.    [provider]  rosuvastatin (CRESTOR) 10 MG tablet Take 1 tablet (10 mg total) by mouth daily. 03/15/22   Chilton Si, MD  vitamin B-12 (CYANOCOBALAMIN) 1000 MCG tablet Take 1,000 mcg by mouth daily.    [provider]  Vitamin D, Ergocalciferol, (DRISDOL) 1.25 MG (50000 UNIT) CAPS capsule Take 50,000 Units by mouth every 7 (seven) days. Tuesdays 01/04/22   [provider]     Allergies:    Allergies  Allergen Reactions   Bystolic [Nebivolol Hcl] Swelling  and Other (See Comments)    Bradycardia    Calcium Channel Blockers Swelling and Other (See Comments)    LE edema   Feraheme [Ferumoxytol] Shortness Of Breath, Nausea And Vomiting and Other (See Comments)    Chest pain   Phenergan [Promethazine Hcl] Itching and Other (See Comments)    Hallucination    Beta Adrenergic Blockers Swelling and Other (See Comments)    bradycardia   Cardizem [Diltiazem] Other (See Comments)    Foot swelling and increased his heart rate   Norvasc [Amlodipine] Other (See Comments)    Unknown reaction   Aspirin Other (See Comments)    Hx of ulcer    Nsaids Other (See Comments)    Hx ulcer   Prednisone Other (See Comments)    Hx of ulcer    Social History:   Social History   Socioeconomic History   Marital status: Married    Spouse name: Not on file   Number of children: Not on file   Years of education: Not on file   Highest education level: Not on file  Occupational History   Not on file  Tobacco Use   Smoking status: Never   Smokeless tobacco: Never  Vaping Use   Vaping status: Never Used  Substance and Sexual Activity   Alcohol use: No   Drug use: No   Sexual activity: Yes  Other Topics Concern   Not on file  Social History Narrative   Lives in [place].  Works as a Merchandiser, retail at [employer]: Not on [employer] Insecurity: No Food Insecurity (02/25/2023)   Hunger Vital Sign    Worried About Running Out of Food in the Last Year: Never true    Ran Out of Food in the Last Year: Never true  Transportation Needs: No Transportation Needs (02/25/2023)   PRAPARE - Administrator, Civil Service (Medical): No    Lack of Transportation (Non-Medical): No  Physical Activity: Not on file  Stress: Not on file  Social Connections: Not on file  Intimate Partner Violence: Not At Risk (02/25/2023)   Humiliation, Afraid, Rape, and Kick questionnaire    Fear of Current or  Ex-Partner: No    Emotionally Abused: No    Physically Abused: No    Sexually Abused: No    Family History:   The patient's family history includes Atrial fibrillation in his brother; Colon polyps in his brother, sister, and sister; Diabetes in his brother; Emphysema in his mother; Lung cancer in his father and sister; Throat cancer in his sister. There is no history of Colon cancer, Esophageal cancer, Rectal cancer, or Stomach cancer.    ROS:  Please see the history of present illness.  All other ROS reviewed and negative.     Physical Exam/Data:  There were no vitals filed for this visit. No intake or output data in the 24 hours ending 07/04/23 1325    07/04/2023   10:43 AM 07/02/2023    3:44 PM 02/27/2023    5:02 AM  Last 3 Weights  Weight (lbs) 219 lb 219 lb 215 lb 9.8 oz  Weight (kg) 99.338 kg 99.338 kg 97.8 kg     There is no height or weight on file to calculate BMI.  General:  Well nourished, well developed, in no acute distress HEENT: normal Neck: no JVD Vascular: No carotid bruits; Distal pulses 2+ bilaterally   Cardiac:  normal S1, S2; RRR; no murmur  Lungs:  clear to auscultation bilaterally, no wheezing, rhonchi or rales  Abd: soft, nontender, no hepatomegaly  Ext: no edema Musculoskeletal:  No deformities, BUE and BLE strength normal and equal Skin: warm and dry  Neuro:  CNs 2-12 intact, no focal abnormalities noted Psych:  Normal affect    EKG:  The ECG that was done in office and was personally reviewed and demonstrates NSR with minimal ST depression in the inferolateral leads and deep T wave inversion. The change is new when compare to 2 days ago  Relevant CV Studies:  Echo 12/04/2014 LV EF: 60% -   65%  Study Conclusions   - Left ventricle: The cavity size was normal. Wall thickness was    increased in a pattern of mild LVH. Systolic function was normal.    The estimated ejection fraction was in the range of 60% to 65%.    Indeterminant diastolic  function (tachycardic). Although no    diagnostic regional wall motion abnormality was identified, this    possibility cannot be completely excluded on the basis of this    study.  - Aortic valve: There was no stenosis.  - Aorta: Mildly dilated aortic root. Aortic root dimension: 38 mm    (ED).  - Mitral valve: There was no significant regurgitation.  - Left atrium: The atrium was mildly dilated.  - Right ventricle: The cavity size was normal. Systolic function    was normal.  - Pulmonary arteries: No complete TR doppler jet so unable to    estimate PA systolic pressure.   Impressions:   - The patient was tachycardic (sinus tachy versus atrial flutter).    Normal LV size with mild LV hypertrophy. EF 60-65%. Normal RV    size and systolic function. No significant valvular    abnormalities.   Laboratory Data:  High Sensitivity Troponin:   Recent Labs  Lab 07/02/23 1652 07/02/23 1902  TROPONINIHS 15 13      Chemistry Recent [employer]  07/02/23 1652  NA 143  K 3.7  CL 108  CO2 24  GLUCOSE 177*  BUN 21  CREATININE 1.38*  CALCIUM 8.9  GFRNONAA 54*  ANIONGAP 11    No results for input(s): "PROT", "ALBUMIN", "AST", "ALT", "ALKPHOS", "BILITOT" in the last 168 hours. Lipids No results for input(s): "CHOL", "TRIG", "HDL", "LABVLDL", "LDLCALC", "CHOLHDL" in the last 168 hours. Hematology Recent Labs  Lab 07/02/23 1652  WBC 6.5  RBC 4.66  HGB 13.8  HCT 41.7  MCV 89.5  MCH 29.6  MCHC 33.1  RDW 15.1  PLT 201   Thyroid No results for input(s): "TSH", "FREET4" in the last 168 hours. BNPNo results for input(s): "BNP", "PROBNP" in the last 168 hours.  DDimer No results for input(s): "DDIMER" in the last 168 hours.   Radiology/Studies:  No results found.   Assessment and Plan:   Chest pain           -He had an hour of chest pain last Thursday with that woke him up from sleep.  He had another hour of chest discomfort Sunday morning after walking to the church.  This  prompted him to go to the emergency room, EKG was normal, serial enzyme negative x 2.  He had another episode of chest pain while in the emergency room.            -He presented today for follow-up on his chest pain.  He has not had any further chest pain in the past 2 days, however EKG showed dramatic change with minimal ST depression and significant T wave inversion in the inferolateral leads.  Repeat EKG shows resolution of ST depression however T wave inversions persisted.             -The patient was seen with Dr. Olga Millers, DOD.  We recommended direct admission to [facility] and definitive evaluation via cardiac catheterization tomorrow.  Last dose of Eliquis was 7 AM this morning.             -Risk and benefit of procedure explained to the patient who display clear understanding and agree to proceed. Discussed with patient possible procedural risk include bleeding, vascular injury, renal injury, arrythmia, MI, stroke and loss of limb or life.   History of atrial fibrillation s/p ablation: On Eliquis and dronedarone, currently maintaining sinus rhythm.  Last dose of Eliquis was 7 AM this morning.  Continue to hold Eliquis until after cath.   Hypertension: Blood well-controlled   Hyperlipidemia: On rosuvastatin   DM2: Sliding scale insulin   Risk Assessment/Risk Scores:    TIMI Risk Score for Unstable Angina or Non-ST Elevation MI:   The patient's TIMI risk score is 3, which indicates a 13% risk of all cause mortality, new or recurrent myocardial infarction or need for urgent revascularization in the next 14 days.      Code Status: Full Code  Severity of Illness: The appropriate patient status for this patient is OBSERVATION. Observation status is judged to be reasonable and necessary in order to provide the required intensity of service to ensure the patient's safety. The patient's presenting symptoms, physical exam findings, and initial radiographic and laboratory data  in the context of their medical condition is felt to place them at decreased risk for further clinical deterioration. Furthermore, it is anticipated that the patient will be medically stable for discharge from the hospital within 2 midnights of admission.    For questions or updates, please contact [facility]  Please consult [email or web address] for contact info under     Signed, Azalee Course, PA  07/04/2023 1:25 PM

## 2023-07-04 NOTE — Telephone Encounter (Signed)
 Patient identification verified by 2 forms. Shade Flood, RN     Called and spoke to patient's wife [name] [name] states:  - patient was advised to go to hospital from OV today.  - She was told patient will be scheduled for cath today or tomorrow and the hospital would call when a bed was available.  - Patient states his chest pain is better since OV this morning.   Patient denies:  - New or worsening symptoms at time of call               Interventions/Plan: - Instructed patient and [name] [name] to wait until they here from the hospital regarding getting the patient admitted for CATH however, patient should go to ER sooner if chest pain gets worst or he develops new symptoms.    Reviewed ED warning signs/precautions  Patient/[name] [name] agrees with plan, no questions at this time

## 2023-07-04 NOTE — Progress Notes (Signed)
 Cardiology Office Note:  .   Date:  07/04/2023  ID:  [name] [name], DOB 06-01-1950, MRN [id number] PCP: Garlan Fillers, MD  [facility] Providers Cardiologist:  Chilton Si, MD     History of Present Illness: .   [name] [name] is a 74 y.o. male with PMH of atrial fibrillation s/p ablation x2 [facility], most recent 2016), HFpEF, HTN, HLD, DM II and prior prostate cancer.  Most recent ablation at [facility] was in 2016, this was complicated by cardiogenic shock required 4 days in the ICU and intubation.  He was seen in December 2021 for recurrent atrial flutter after surgery.  He underwent TEE DCCV in 03/2020.  He was started back on 2 [place].  He had a recurrent rate controlled atrial fibrillation in February 2022 triggered by UTI.  He underwent successful cardioversion in February 2022.  He was admitted with symptomatic anemia secondary to upper GI bleeding in 2023.  EGD revealed nonbleeding gastric ulcer which was treated with epi injection and 3 clips.  The Xarelto was held for 5 days postprocedure.  Xarelto was later switched to Eliquis in December 2023.  He was not interested in [place] procedure.  He was last seen by Dr. Duke Salvia in June 2024 at which time he was doing well and maintaining sinus rhythm.  Since the last visit, patient was admitted with acute bronchitis in November 2024.  Viral panel positive for rhinovirus.  She was treated with Solu-Medrol.  BNP only 161.  Although discharge note mention patient was in A-fib, however EKG was personally viewed by myself and showed sinus rhythm.  More recently, patient went to the emergency room on 07/02/2023 after waking up with chest pain.  Symptoms was substernal and lasted about an hour before resolving by self.  He called cardiology service to schedule appointment, however by Sunday morning he had another episode of chest pain walking to the charge.  This prompted him to seek urgent medical attention in the emergency room.  He had a third  episode of chest pain while in the emergency room.  Chest x-ray was normal.  Basic metabolic panel showed a creatinine of 1.38, no significant electrolyte abnormality.  Serial troponin 15--> 13.  Chest pain slightly worse with deep inspiration.  He presents today for evaluation of chest discomfort.  He has not had any more chest discomfort since Sunday.  However EKG obtained in the office today showed dramatic changes when compared to Sunday's EKG.  He is in sinus rhythm with significant T wave inversion in the inferolateral leads with minimal ST depression.  There was no ST elevation.  EKG was repeated which continue to show T wave inversion however no significant ST depression.  Patient was seen together with DOD Dr. Jens Som, we verified with the patient that he has not had any chest pain since Sunday.  His last dose of Eliquis was this morning around 7 AM.  Given recurrent chest discomfort, we recommend direct admission to cardiology service and plan for cardiac catheterization tomorrow after holding Eliquis.  Risk and benefit of the cardiac authorization has been explained to the patient who is agreeable to proceed.   ROS:   He denies palpitations, dyspnea, pnd, orthopnea, n, v, dizziness, syncope, edema, weight gain, or early satiety. All other systems reviewed and are otherwise negative except as noted above.   He had 3 episode of chest pain last week.  Studies Reviewed: .        Cardiac Studies &  Procedures   ______________________________________________________________________________________________   STRESS TESTS  NM MYOCAR MULTI W/SPECT W 01/10/2011  Narrative *RADIOLOGY REPORT*  Clinical Data:  Chest pain  MYOCARDIAL IMAGING WITH SPECT (REST AND PHARMACOLOGIC-STRESS) GATED LEFT VENTRICULAR WALL MOTION STUDY LEFT VENTRICULAR EJECTION FRACTION  Technique:  Standard myocardial SPECT imaging was performed after resting intravenous injection of 10 mCi Tc-47m  tetrofosmin. Subsequently, intravenous infusion of Lexiscan was performed under the supervision of the Cardiology staff.  At peak effect of the drug, 30 mCi Tc-43m tetrofosmin was injected intravenously and standard myocardial SPECT  imaging was performed.  Quantitative gated imaging was also performed to evaluate left ventricular wall motion, and estimate left ventricular ejection fraction.  Comparison:  None  Findings: SPECT imaging demonstrates no reversible or irreversible defects to suggest ischemia or infarct.  Quantitative gated analysis shows normal wall motion.  The resting left ventricular ejection fraction is 58% with end- diastolic volume of 122 ml and end-systolic volume of 51 ml.  IMPRESSION: No evidence of ischemia or infarct.  Ejection fraction 58%.  Original Report Authenticated By: Cyndie Chime, M.D.   ECHOCARDIOGRAM  ECHOCARDIOGRAM COMPLETE 11/24/2014  Narrative *[place]* *[facility]* [address] [place], [place] [address] [phone]  ------------------------------------------------------------------- Transthoracic Echocardiography  Patient:    [name] [name] MR #:       [id number] Study Date: 11/24/2014 Gender:     M Age:        74 Height:     172.7 cm Weight:     102.5 kg BSA:        2.26 m^2 Pt. Status: Room:       2W08C  SONOGRAPHER  Nolon Rod, RDCS ATTENDING    Zannie Cove [id number] ADMITTING    Ghimire, Shanker M ORDERING     Ghimire, Shanker M REFERRING    Ghimire, Shanker M PERFORMING   [facility], Inpatient  cc:  ------------------------------------------------------------------- LV EF: 60% -   65%  ------------------------------------------------------------------- Indications:      Atrial fibrillation - 427.31.  ------------------------------------------------------------------- History:   PMH:   Atrial fibrillation.  Atrial flutter.  Risk factors:  Hypertension. Diabetes  mellitus.  ------------------------------------------------------------------- Study Conclusions  - Left ventricle: The cavity size was normal. Wall thickness was increased in a pattern of mild LVH. Systolic function was normal. The estimated ejection fraction was in the range of 60% to 65%. Indeterminant diastolic function (tachycardic). Although no diagnostic regional wall motion abnormality was identified, this possibility cannot be completely excluded on the basis of this study. - Aortic valve: There was no stenosis. - Aorta: Mildly dilated aortic root. Aortic root dimension: 38 mm (ED). - Mitral valve: There was no significant regurgitation. - Left atrium: The atrium was mildly dilated. - Right ventricle: The cavity size was normal. Systolic function was normal. - Pulmonary arteries: No complete TR doppler jet so unable to estimate PA systolic pressure.  Impressions:  - The patient was tachycardic (sinus tachy versus atrial flutter). Normal LV size with mild LV hypertrophy. EF 60-65%. Normal RV size and systolic function. No significant valvular abnormalities.  Transthoracic echocardiography.  M-mode, complete 2D, spectral Doppler, and color Doppler.  Birthdate:  Patient birthdate: April 21, 1950.  Age:  Patient is 73 yr old.  Sex:  Gender: male. BMI: 34.4 kg/m^2.  Blood pressure:     120/105  Patient status: Inpatient.  Study date:  Study date: 11/24/2014. Study time: 10:03 AM.  -------------------------------------------------------------------  ------------------------------------------------------------------- Left ventricle:  The cavity size was normal. Wall thickness was increased in a  pattern of mild LVH. Systolic function was normal. The estimated ejection fraction was in the range of 60% to 65%. Indeterminant diastolic function (tachycardic). Although no diagnostic regional wall motion abnormality was identified, this possibility cannot be completely excluded on  the basis of this study.  ------------------------------------------------------------------- Aortic valve:   Trileaflet.  Doppler:   There was no stenosis. There was no regurgitation.  ------------------------------------------------------------------- Aorta:  Mildly dilated aortic root.  ------------------------------------------------------------------- Mitral valve:   Mildly calcified leaflets .  Doppler:   There was no evidence for stenosis.   There was no significant regurgitation.  ------------------------------------------------------------------- Left atrium:  The atrium was mildly dilated.  ------------------------------------------------------------------- Right ventricle:  The cavity size was normal. Systolic function was normal.  ------------------------------------------------------------------- Pulmonic valve:    Structurally normal valve.   Cusp separation was normal.  Doppler:  Transvalvular velocity was within the normal range. There was no regurgitation.  ------------------------------------------------------------------- Tricuspid valve:   Doppler:  There was no significant regurgitation.  ------------------------------------------------------------------- Pulmonary artery:   No complete TR doppler jet so unable to estimate PA systolic pressure.  ------------------------------------------------------------------- Right atrium:  The atrium was normal in size.  ------------------------------------------------------------------- Pericardium:  There was no pericardial effusion.  ------------------------------------------------------------------- Post procedure conclusions Ascending Aorta:  - Mildly dilated aortic root.  ------------------------------------------------------------------- Measurements  Left ventricle                           Value        Reference LV ID, ED, PLAX chordal        (L)       41.4  mm     43 - 52 LV ID, ES, PLAX chordal                   32.4  mm     23 - 38 LV fx shortening, PLAX chordal (L)       22    %      >=29 LV PW thickness, ED                      11.5  mm     --------- IVS/LV PW ratio, ED                      1.02         <=1.3  Ventricular septum                       Value        Reference IVS thickness, ED                        11.7  mm     ---------  LVOT                                     Value        Reference LVOT ID, S                               19    mm     --------- LVOT area  2.84  cm^2   ---------  Aorta                                    Value        Reference Aortic root ID, ED                       37    mm     ---------  Left atrium                              Value        Reference LA ID, A-P, ES                           41    mm     --------- LA ID/bsa, A-P                           1.82  cm/m^2 <=2.2 LA volume, S                             61    ml     --------- LA volume/bsa, S                         27    ml/m^2 --------- LA volume, ES, 1-p A4C                   69    ml     --------- LA volume/bsa, ES, 1-p A4C               30.6  ml/m^2 --------- LA volume, ES, 1-p A2C                   53    ml     --------- LA volume/bsa, ES, 1-p A2C               23.5  ml/m^2 ---------  Legend: (L)  and  (H)  mark values outside specified reference range.  ------------------------------------------------------------------- Prepared and Electronically Authenticated by  Marca Ancona, M.D. 2016-08-16T13:34:10   TEE  ECHO TEE 03/25/2020  Narrative TRANSESOPHOGEAL ECHO REPORT    Patient Name:   COLBEY WIRTANEN Date of Exam: 03/25/2020 Medical Rec #:  [id number]        Height:       68.0 in Accession #:    [id number]       Weight:       207.8 lb Date of Birth:  December 17, 1950        BSA:          2.077 m Patient Age:    69 years         BP:           141/104 mmHg Patient Gender: M                HR:           89 bpm. Exam Location:   Outpatient  Procedure: Cardiac Doppler, Color Doppler and Transesophageal Echo  Indications:    Atrial fibrilation  History:        Patient has prior history of  Echocardiogram examinations, most recent 11/24/2014.  Sonographer:    Margreta Journey Referring Phys: [phone] DONNA C CARROLL  PROCEDURE: After discussion of the risks and benefits of a TEE, an informed consent was obtained from the patient. The transesophogeal probe was passed without difficulty through the esophogus of the patient. Sedation performed by different physician. The patient was monitored while under deep sedation. Anesthestetic sedation was provided intravenously by Anesthesiology: 190mg  of Propofol. Image quality was adequate. The patient's vital signs; including heart rate, blood pressure, and oxygen saturation; remained stable throughout the procedure. The patient developed no complications during the procedure.  IMPRESSIONS   1. Left ventricular ejection fraction, by estimation, is 60 to 65%. The left ventricle has normal function. The left ventricle has no regional wall motion abnormalities. 2. Right ventricular systolic function is normal. The right ventricular size is normal. 3. No left atrial/left atrial appendage thrombus was detected. The LAA emptying velocity was 86 cm/s. 4. The mitral valve is normal in structure. Mild mitral valve regurgitation. No evidence of mitral stenosis. 5. The aortic valve is tricuspid. Aortic valve regurgitation is not visualized. No aortic stenosis is present. 6. Aortic dilatation noted. There is mild dilatation of the ascending aorta, measuring 38 mm. 7. The inferior vena cava is normal in size with greater than 50% respiratory variability, suggesting right atrial pressure of 3 mmHg.  Conclusion(s)/Recommendation(s): Normal biventricular function without evidence of hemodynamically significant valvular heart disease.  FINDINGS Left Ventricle: Left ventricular ejection  fraction, by estimation, is 60 to 65%. The left ventricle has normal function. The left ventricle has no regional wall motion abnormalities. The left ventricular internal cavity size was normal in size. There is no left ventricular hypertrophy.  Right Ventricle: The right ventricular size is normal. No increase in right ventricular wall thickness. Right ventricular systolic function is normal.  Left Atrium: Left atrial size was normal in size. No left atrial/left atrial appendage thrombus was detected. The LAA emptying velocity was 86 cm/s.  Right Atrium: Right atrial size was normal in size.  Pericardium: There is no evidence of pericardial effusion.  Mitral Valve: The mitral valve is normal in structure. Mild mitral valve regurgitation. No evidence of mitral valve stenosis.  Tricuspid Valve: The tricuspid valve is normal in structure. Tricuspid valve regurgitation is trivial. No evidence of tricuspid stenosis.  Aortic Valve: The aortic valve is tricuspid. Aortic valve regurgitation is not visualized. No aortic stenosis is present.  Pulmonic Valve: The pulmonic valve was normal in structure. Pulmonic valve regurgitation is not visualized. No evidence of pulmonic stenosis.  Aorta: Aortic dilatation noted. There is mild dilatation of the ascending aorta, measuring 38 mm.  Venous: The inferior vena cava is normal in size with greater than 50% respiratory variability, suggesting right atrial pressure of 3 mmHg.  IAS/Shunts: No atrial level shunt detected by color flow Doppler.  Chilton Si MD Electronically signed by Chilton Si MD Signature Date/Time: 03/25/2020/3:59:37 PM    Final        ______________________________________________________________________________________________      Risk Assessment/Calculations:    CHA2DS2-VASc Score = 3   This indicates a 3.2% annual risk of stroke. The patient's score is based upon: CHF History: 0 HTN History: 1 Diabetes  History: 1 Stroke History: 0 Vascular Disease History: 0 Age Score: 1 Gender Score: 0            Physical Exam:   VS:  BP 132/76   Pulse 61   Ht 5\' 7"  (1.702 m)   Wt  219 lb (99.3 kg)   SpO2 97%   BMI 34.30 kg/m    Wt Readings from Last 3 Encounters:  07/04/23 219 lb (99.3 kg)  07/02/23 219 lb (99.3 kg)  02/27/23 215 lb 9.8 oz (97.8 kg)    GEN: Well nourished, well developed in no acute distress NECK: No JVD; No carotid bruits CARDIAC: RRR, no murmurs, rubs, gallops RESPIRATORY:  Clear to auscultation without rales, wheezing or rhonchi  ABDOMEN: Soft, non-tender, non-distended EXTREMITIES:  No edema; No deformity   ASSESSMENT AND PLAN: .    Chest pain  -He had an hour of chest pain last Thursday with that woke him up from sleep.  He had another hour of chest discomfort Sunday morning after walking to the church.  This prompted him to go to the emergency room, EKG was normal, serial enzyme negative x 2.  He had another episode of chest pain while in the emergency room.  -He presented today for follow-up on his chest pain.  He has not had any further chest pain in the past 2 days, however EKG showed dramatic change with minimal ST depression and significant T wave inversion in the inferolateral leads.  Repeat EKG shows resolution of ST depression however T wave inversions persisted.  -The patient was seen with Dr. Olga Millers, DOD.  We recommended direct admission to [facility] and definitive evaluation via cardiac catheterization tomorrow.  Last dose of Eliquis was 7 AM this morning.  -Risk and benefit of procedure explained to the patient who display clear understanding and agree to proceed. Discussed with patient possible procedural risk include bleeding, vascular injury, renal injury, arrythmia, MI, stroke and loss of limb or life.  History of atrial fibrillation s/p ablation: On Eliquis and dronedarone, currently maintaining sinus rhythm.  Last dose of Eliquis was 7  AM this morning.  Continue to hold Eliquis until after cath.  Hypertension: Blood well-controlled  Hyperlipidemia: On rosuvastatin  DM2: Sliding scale insulin    Informed Consent   Shared Decision Making/Informed Consent The risks [stroke (1 in 1000), death (1 in 1000), kidney failure [usually temporary] (1 in 500), bleeding (1 in 200), allergic reaction [possibly serious] (1 in 200)], benefits (diagnostic support and management of coronary artery disease) and alternatives of a cardiac catheterization were discussed in detail with Mr. Capp and he is willing to proceed.     Dispo: Follow-up in 3 to 4 weeks.  Signed, Azalee Course, PA

## 2023-07-05 ENCOUNTER — Ambulatory Visit (HOSPITAL_COMMUNITY): Admission: RE | Admit: 2023-07-05 | Source: Home / Self Care | Admitting: Cardiology

## 2023-07-05 ENCOUNTER — Encounter (HOSPITAL_COMMUNITY)
Admission: AD | Disposition: A | Discharge status: Home / Self Care | Payer: Self-pay | Source: Ambulatory Visit | Attending: Cardiology

## 2023-07-05 ENCOUNTER — Observation Stay (HOSPITAL_BASED_OUTPATIENT_CLINIC_OR_DEPARTMENT_OTHER)

## 2023-07-05 ENCOUNTER — Observation Stay (HOSPITAL_COMMUNITY)

## 2023-07-05 DIAGNOSIS — Z79899 Other long term (current) drug therapy: Secondary | ICD-10-CM | POA: Diagnosis not present

## 2023-07-05 DIAGNOSIS — Z8546 Personal history of malignant neoplasm of prostate: Secondary | ICD-10-CM | POA: Diagnosis not present

## 2023-07-05 DIAGNOSIS — R079 Chest pain, unspecified: Secondary | ICD-10-CM

## 2023-07-05 DIAGNOSIS — I2510 Atherosclerotic heart disease of native coronary artery without angina pectoris: Secondary | ICD-10-CM

## 2023-07-05 DIAGNOSIS — N183 Chronic kidney disease, stage 3 unspecified: Secondary | ICD-10-CM | POA: Diagnosis not present

## 2023-07-05 DIAGNOSIS — N200 Calculus of kidney: Secondary | ICD-10-CM | POA: Diagnosis not present

## 2023-07-05 DIAGNOSIS — K8020 Calculus of gallbladder without cholecystitis without obstruction: Secondary | ICD-10-CM | POA: Diagnosis not present

## 2023-07-05 DIAGNOSIS — I200 Unstable angina: Secondary | ICD-10-CM | POA: Diagnosis not present

## 2023-07-05 DIAGNOSIS — E039 Hypothyroidism, unspecified: Secondary | ICD-10-CM | POA: Diagnosis not present

## 2023-07-05 DIAGNOSIS — I480 Paroxysmal atrial fibrillation: Secondary | ICD-10-CM | POA: Diagnosis not present

## 2023-07-05 DIAGNOSIS — Z7901 Long term (current) use of anticoagulants: Secondary | ICD-10-CM | POA: Diagnosis not present

## 2023-07-05 DIAGNOSIS — Z7984 Long term (current) use of oral hypoglycemic drugs: Secondary | ICD-10-CM | POA: Diagnosis not present

## 2023-07-05 DIAGNOSIS — E118 Type 2 diabetes mellitus with unspecified complications: Secondary | ICD-10-CM | POA: Diagnosis not present

## 2023-07-05 DIAGNOSIS — R590 Localized enlarged lymph nodes: Secondary | ICD-10-CM | POA: Diagnosis not present

## 2023-07-05 DIAGNOSIS — I25110 Atherosclerotic heart disease of native coronary artery with unstable angina pectoris: Secondary | ICD-10-CM | POA: Diagnosis not present

## 2023-07-05 DIAGNOSIS — K4090 Unilateral inguinal hernia, without obstruction or gangrene, not specified as recurrent: Secondary | ICD-10-CM | POA: Diagnosis not present

## 2023-07-05 DIAGNOSIS — I10 Essential (primary) hypertension: Secondary | ICD-10-CM | POA: Diagnosis not present

## 2023-07-05 DIAGNOSIS — I4892 Unspecified atrial flutter: Secondary | ICD-10-CM | POA: Diagnosis not present

## 2023-07-05 DIAGNOSIS — E1122 Type 2 diabetes mellitus with diabetic chronic kidney disease: Secondary | ICD-10-CM | POA: Diagnosis not present

## 2023-07-05 DIAGNOSIS — I130 Hypertensive heart and chronic kidney disease with heart failure and stage 1 through stage 4 chronic kidney disease, or unspecified chronic kidney disease: Secondary | ICD-10-CM | POA: Diagnosis not present

## 2023-07-05 DIAGNOSIS — E785 Hyperlipidemia, unspecified: Secondary | ICD-10-CM | POA: Diagnosis not present

## 2023-07-05 LAB — ECHOCARDIOGRAM COMPLETE
AR max vel: 2.28 cm2
AV Area VTI: 2.25 cm2
AV Area mean vel: 2.24 cm2
AV Mean grad: 3 mmHg
AV Peak grad: 6.3 mmHg
Ao pk vel: 1.25 m/s
Area-P 1/2: 3.46 cm2
Height: 67 in
S' Lateral: 3.6 cm
Weight: 3464 [oz_av]

## 2023-07-05 LAB — COMPREHENSIVE METABOLIC PANEL
ALT: 20 U/L (ref 0–44)
AST: 18 U/L (ref 15–41)
Albumin: 3.2 g/dL — ABNORMAL LOW (ref 3.5–5.0)
Alkaline Phosphatase: 58 U/L (ref 38–126)
Anion gap: 13 (ref 5–15)
BUN: 18 mg/dL (ref 8–23)
CO2: 20 mmol/L — ABNORMAL LOW (ref 22–32)
Calcium: 8.7 mg/dL — ABNORMAL LOW (ref 8.9–10.3)
Chloride: 110 mmol/L (ref 98–111)
Creatinine, Ser: 1.21 mg/dL (ref 0.61–1.24)
GFR, Estimated: >60 mL/min (ref >60)
Glucose, Bld: 98 mg/dL (ref 70–99)
Potassium: 3.9 mmol/L (ref 3.5–5.1)
Sodium: 143 mmol/L (ref 135–145)
Total Bilirubin: 0.9 mg/dL (ref 0.0–1.2)
Total Protein: 5.7 g/dL — ABNORMAL LOW (ref 6.5–8.1)

## 2023-07-05 LAB — BASIC METABOLIC PANEL
Anion gap: 7 (ref 5–15)
BUN: 20 mg/dL (ref 8–23)
CO2: 24 mmol/L (ref 22–32)
Calcium: 9 mg/dL (ref 8.9–10.3)
Chloride: 109 mmol/L (ref 98–111)
Creatinine, Ser: 1.28 mg/dL — ABNORMAL HIGH (ref 0.61–1.24)
GFR, Estimated: 59 mL/min — ABNORMAL LOW (ref >60)
Glucose, Bld: 155 mg/dL — ABNORMAL HIGH (ref 70–99)
Potassium: 3.6 mmol/L (ref 3.5–5.1)
Sodium: 140 mmol/L (ref 135–145)

## 2023-07-05 LAB — GLUCOSE, CAPILLARY
Glucose-Capillary: 118 mg/dL — ABNORMAL HIGH (ref 70–99)
Glucose-Capillary: 98 mg/dL (ref 70–99)
Glucose-Capillary: 88 mg/dL (ref 70–99)

## 2023-07-05 LAB — LIPASE, BLOOD: Lipase: 27 U/L (ref 11–51)

## 2023-07-05 SURGERY — LEFT HEART CATH AND CORONARY ANGIOGRAPHY
Anesthesia: LOCAL

## 2023-07-05 MED ORDER — ALUM & MAG HYDROXIDE-SIMETH 200-200-20 MG/5ML PO SUSP
30.0000 mL | Freq: Once | ORAL | Status: AC
  Administered 2023-07-05: 30 mL via ORAL
  Filled 2023-07-05: qty 30

## 2023-07-05 MED ORDER — INSULIN ASPART 100 UNIT/ML IJ SOLN: 0.0000 [IU] | Freq: Every day | INTRAMUSCULAR | Status: DC

## 2023-07-05 MED ORDER — VERAPAMIL HCL 2.5 MG/ML IV SOLN
INTRAVENOUS | Status: AC
  Filled 2023-07-05: qty 2

## 2023-07-05 MED ORDER — IOHEXOL 350 MG/ML SOLN
75.0000 mL | Freq: Once | INTRAVENOUS | Status: AC | PRN
  Administered 2023-07-05: 75 mL via INTRAVENOUS

## 2023-07-05 MED ORDER — LIDOCAINE HCL (PF) 1 % IJ SOLN
INTRAMUSCULAR | Status: DC | PRN
  Administered 2023-07-05: 3 mL
  Administered 2023-07-05: 12 mL

## 2023-07-05 MED ORDER — DICYCLOMINE HCL 10 MG/5ML PO SOLN
10.0000 mg | Freq: Once | ORAL | Status: AC
  Administered 2023-07-05: 10 mg via ORAL
  Filled 2023-07-05: qty 5

## 2023-07-05 MED ORDER — MIDAZOLAM HCL 2 MG/2ML IJ SOLN
INTRAMUSCULAR | Status: AC
  Filled 2023-07-05: qty 2

## 2023-07-05 MED ORDER — INSULIN ASPART 100 UNIT/ML IJ SOLN: 0.0000 [IU] | Freq: Three times a day (TID) | INTRAMUSCULAR | Status: DC

## 2023-07-05 MED ORDER — SODIUM CHLORIDE 0.9 % WEIGHT BASED INFUSION: 1.0000 mL/kg/h | INTRAVENOUS | Status: DC

## 2023-07-05 MED ORDER — MORPHINE SULFATE (PF) 2 MG/ML IV SOLN
2.0000 mg | Freq: Once | INTRAVENOUS | Status: AC
  Administered 2023-07-05: 2 mg via INTRAVENOUS
  Filled 2023-07-05: qty 1

## 2023-07-05 MED ORDER — MORPHINE SULFATE (PF) 2 MG/ML IV SOLN
2.0000 mg | INTRAVENOUS | Status: DC | PRN
  Administered 2023-07-05 – 2023-07-06 (×2): 2 mg via INTRAVENOUS
  Filled 2023-07-05 (×2): qty 1

## 2023-07-05 MED ORDER — SODIUM CHLORIDE 0.9 % WEIGHT BASED INFUSION: 3.0000 mL/kg/h | INTRAVENOUS | Status: DC

## 2023-07-05 MED ORDER — HYOSCYAMINE SULFATE 0.125 MG SL SUBL
0.2500 mg | SUBLINGUAL_TABLET | Freq: Once | SUBLINGUAL | Status: AC
  Administered 2023-07-05: 0.25 mg via SUBLINGUAL
  Filled 2023-07-05: qty 2

## 2023-07-05 MED ORDER — HEPARIN SODIUM (PORCINE) 1000 UNIT/ML IJ SOLN
INTRAMUSCULAR | Status: AC
  Filled 2023-07-05: qty 10

## 2023-07-05 MED ORDER — SODIUM CHLORIDE 0.9% FLUSH
3.0000 mL | INTRAVENOUS | Status: DC | PRN
  Administered 2023-07-05: 3 mL via INTRAVENOUS

## 2023-07-05 MED ORDER — FENTANYL CITRATE (PF) 100 MCG/2ML IJ SOLN
INTRAMUSCULAR | Status: AC
  Filled 2023-07-05: qty 2

## 2023-07-05 MED ORDER — MIDAZOLAM HCL 2 MG/2ML IJ SOLN
INTRAMUSCULAR | Status: DC | PRN
  Administered 2023-07-05: 1 mg via INTRAVENOUS
  Administered 2023-07-05: 2 mg via INTRAVENOUS

## 2023-07-05 MED ORDER — SODIUM CHLORIDE 0.9% FLUSH
3.0000 mL | Freq: Two times a day (BID) | INTRAVENOUS | Status: DC
  Administered 2023-07-05: 3 mL via INTRAVENOUS

## 2023-07-05 MED ORDER — LIDOCAINE HCL (PF) 1 % IJ SOLN
INTRAMUSCULAR | Status: AC
  Filled 2023-07-05: qty 30

## 2023-07-05 MED ORDER — FENTANYL CITRATE (PF) 100 MCG/2ML IJ SOLN
INTRAMUSCULAR | Status: DC | PRN
  Administered 2023-07-05 (×2): 25 ug via INTRAVENOUS

## 2023-07-05 MED ORDER — SODIUM CHLORIDE 0.9 % IV SOLN: 250.0000 mL | INTRAVENOUS | Status: DC | PRN

## 2023-07-05 MED ORDER — VERAPAMIL HCL 2.5 MG/ML IV SOLN
INTRAVENOUS | Status: DC | PRN
  Administered 2023-07-05: 10 mL via INTRA_ARTERIAL

## 2023-07-05 MED ORDER — HEPARIN (PORCINE) IN NACL 1000-0.9 UT/500ML-% IV SOLN
INTRAVENOUS | Status: DC | PRN
  Administered 2023-07-05 (×2): 500 mL

## 2023-07-05 MED ORDER — IOHEXOL 350 MG/ML SOLN
INTRAVENOUS | Status: DC | PRN
  Administered 2023-07-05: 40 mL

## 2023-07-05 MED ORDER — ASPIRIN 81 MG PO CHEW
81.0000 mg | CHEWABLE_TABLET | ORAL | Status: AC
  Administered 2023-07-05: 81 mg via ORAL
  Filled 2023-07-05: qty 1

## 2023-07-05 SURGICAL SUPPLY — 13 items
CATH INFINITI 5FR JL4 (CATHETERS) IMPLANT
CATH INFINITI JR4 5F (CATHETERS) IMPLANT
CLOSURE MYNX CONTROL 5F (Vascular Products) IMPLANT
DEVICE RAD COMP TR BAND LRG (VASCULAR PRODUCTS) IMPLANT
GLIDESHEATH SLEND SS 6F .021 (SHEATH) IMPLANT
GUIDEWIRE INQWIRE 1.5J.035X260 (WIRE) IMPLANT
INQWIRE 1.5J .035X260CM (WIRE) ×1 IMPLANT
KIT MICROPUNCTURE NIT STIFF (SHEATH) IMPLANT
PACK CARDIAC CATHETERIZATION (CUSTOM PROCEDURE TRAY) ×1 IMPLANT
SET ATX-X65L (MISCELLANEOUS) IMPLANT
SHEATH PINNACLE 5F 10CM (SHEATH) IMPLANT
SHEATH PROBE COVER 6X72 (BAG) IMPLANT
WIRE HI TORQ VERSACORE-J 145CM (WIRE) IMPLANT

## 2023-07-05 NOTE — Progress Notes (Signed)
   Notified that patient was having intense pain post-cath. Upon examination there was no bruising, hematoma or obvious deformities. His pain was primarily in his left-sided sternum down to mid-abdomen. His LHC showed minimal nonobstructive CAD. Patient has a history of diverticulitis and had CT chest done in 2017 that showed "soft tissue inflammation at the level of the inferior sternum and xiphoid process extending posteriorly to the chest wall". Due to reassuringly normal cardiac workup does not appear to be a cardiac issue at this time. Ordered CT chest/abdomen/pelvis with contrast, lipase, repeat CMP. Ordered morphine 2 mg Q4 hours for pain and GI cocktail per Dr. Flora Lipps. Will follow to see if this gives relief of this pain.   Olena Leatherwood, PA-C 07/05/2023 5:05 PM

## 2023-07-05 NOTE — Progress Notes (Signed)
 Echocardiogram 2D Echocardiogram has been performed.  [name] [name] 07/05/2023, 9:03 AM

## 2023-07-05 NOTE — H&P (View-Only) (Signed)
   Patient Name: [name] [name] Date of Encounter: 07/05/2023 [facility] Cardiologist: Chilton Si, MD   Interval Summary  .    Patient reports feeling good Slightly anxious about his cath today but ready  Was undergoing echocardiogram as I entered the room  Vital Signs .    Vitals:   07/04/23 2127 07/04/23 2334 07/05/23 0300 07/05/23 0707  BP: (!) 171/89 (!) 156/85 (!) 118/102 139/82  Pulse: 65 65 80 60  Resp:  18  13  Temp:  98.2 F (36.8 C) 98.2 F (36.8 C) 98 F (36.7 C)  TempSrc:  Oral Oral Oral  SpO2:  93%  98%  Weight:      Height:        Intake/Output Summary (Last 24 hours) at 07/05/2023 0851 Last data filed at 07/05/2023 0300 Gross per 24 hour  Intake --  Output 1300 ml  Net -1300 ml      07/04/2023    6:47 PM 07/04/2023   10:43 AM 07/02/2023    3:44 PM  Last 3 Weights  Weight (lbs) 216 lb 8 oz 219 lb 219 lb  Weight (kg) 98.204 kg 99.338 kg 99.338 kg      Telemetry/ECG    Sinus rhythm, HR 60-70s, some PVCs - Personally Reviewed  Physical Exam .   GEN: No acute distress.   Neck: No JVD Cardiac: RRR, no murmurs, rubs, or gallops.  Respiratory: Clear to auscultation bilaterally. GI: Soft, nontender, non-distended  MS: No edema  Assessment & Plan .    [name] [name] is a 73 y.o. male with atrial fibrillation s/p ablation x2 [facility], most recent 2016), HFpEF, HTN, HLD, DM II and prior prostate cancer  who is being seen 07/04/2023 for the evaluation of chest pain. He was sent as direct admit from our outpatient office and scheduled for cardiac cath on 07/05/2023  Chest pain Reported chest pain starting last Thursday that awoke him from sleep then another hour of chest discomfort Sunday morning after walking to church He went to the ED then, EKG was normal, troponin negative x 2 He experienced another episode of chest pain while in the ED Presented for follow up in our outpatient office, EKG in office showed dramatic changes, minimal ST  depression, significant T wave inversions in inferolateral leads Repeat EKG showed resolution of ST depression but TWI persisted  Follow up EKG in hospital, showed resolved TWI  He was then sent, direct admit, to [facility] for heart cath 3/26 Continue ASA 81 mg daily Scheduled for cardiac cath afternoon 3/26  NPO since midnight  Updated echocardiogram was being obtained, pending results   History of atrial fibrillation s/p ablation x2 Currently maintaining NSR Was on dronedarone and Eliquis  Last dose Eliquis 7 AM 3/25 Scheduled for cath afternoon 3/26 -- hold Eliquis until after cath Continue dronedarone 400 mg BID Restart Elqiuis after cath   Hypertension BP well controlled Continue losartan 50 mg daily Continue CoReg 37.5 mg BID  Hyperlipidemia Lipid panel from 02/2023: total 122, HDL 39, LDL 58, trigs 147 Continue Crestor 10 mg daily  Type 2 diabetes mellitus A1C from 02/2023 was 6.7 Continue with SSI while inpatient    Hypothyroidism Continue home levothyroxine   For questions or updates, please contact [facility] Please consult [email or web address] for contact info under        Signed, Olena Leatherwood, PA-C

## 2023-07-05 NOTE — Care Management Obs Status (Signed)
 MEDICARE OBSERVATION STATUS NOTIFICATION   Patient Details  Name: [name] [name] MRN: [id number] Date of Birth: 1950/11/05   Medicare Observation Status Notification Given:  Yes    Lawerance Sabal, RN 07/05/2023, 11:24 AM

## 2023-07-05 NOTE — Discharge Summary (Signed)
 Discharge Summary    Patient ID: [name] [name] MRN: [id number]; DOB: 02/08/1951  Admit date: 07/04/2023 Discharge date: 07/06/2023  PCP:  Garlan Fillers, MD   [facility] Providers Cardiologist:  Chilton Si, MD       Discharge Diagnoses    Principal Problem:   Unstable angina [facility]) Active Problems:   Primary hypertension   S/P ablation of atrial flutter-2013 and Oct 2014   Hypothyroidism   Hyperlipidemia associated with type 2 diabetes mellitus (HCC)   Type 2 diabetes mellitus with complication, without long-term current use of insulin (HCC)   Nonobstructive atherosclerosis of coronary artery  Diagnostic Studies/Procedures  Echocardiogram 07/05/2023 IMPRESSIONS   1. Left ventricular ejection fraction, by estimation, is 45 to 50%. Left  ventricular ejection fraction by 3D volume is 46 %. The left ventricle has  mildly decreased function. The left ventricle has no regional wall motion  abnormalities. The average left   ventricular global longitudinal strain is -13.2 %. The global  longitudinal strain is abnormal.   2. Right ventricular systolic function is normal. The right ventricular  size is mildly enlarged.   3. The mitral valve is normal in structure. No evidence of mitral valve  regurgitation. No evidence of mitral stenosis.   4. The aortic valve is normal in structure. Aortic valve regurgitation is  not visualized. No aortic stenosis is present.   5. The inferior vena cava is normal in size with greater than 50%  respiratory variability, suggesting right atrial pressure of 3 mmHg.   Cardiac catheterization 07/05/2023   Prox LAD to Mid LAD lesion is 45% stenosed.   LV end diastolic pressure is mildly elevated.   Nonobstructive disease in the mid LAD Mildly elevated LVEDP 19 mm Hg   Plan: consider other causes of chest pain.    History of Present Illness     [name] [name] is a 73 y.o. male with  atrial fibrillation s/p ablation x2 [facility],  most recent 2016), HFpEF, HTN, HLD, DM II and prior prostate cancer  who is being seen 07/04/2023 for the evaluation of chest pain. He was sent as direct admit from our outpatient office and scheduled for cardiac cath on 07/05/2023   Hospital Course     Patient presented as a direct admit from our outpatient office for a scheduled cardiac cath. The patient reported chest pain last Thursday that awoke him from sleep then another hour of chest discomfort Sunday morning after walking to church. He went to the ED then, EKG was normal, troponin negative x 2. He experienced another episode of chest pain while in the ED. Presented for follow up in our outpatient office, EKG in office showed dramatic changes, minimal ST depression, significant T wave inversions in inferolateral leads. Repeat EKG showed resolution of ST depression but TWI persisted. Follow up EKG in hospital, showed resolved TWI. He was then sent, direct admit, to [facility] for heart cath 3/26  He underwent LHC by Dr. [place] on 07/05/2023 that showed nonobstructive CAD in mid LAD (45% stenosed), mildly elevated LVEDP at 19 mmHg. Patient underwent femoral access with Mynx closure as they were unable to access from radial artery due to innominate tortuosity. Femoral and radial sites are healing well.   Patient spent the night after his cath due to severe pain primarily located at his left side of his sternum down to his mid abdomen.  Lipase was ordered which was normal, repeat CMP was relatively stable compared to prior this  admission.  CT chest/abdomen/pelvis with contrast was ordered and demonstrated no acute process in the chest, abdomen, pelvis, aortic atherosclerosis without evidence of aneurysm, nonobstructive left renal calculus, which we presume to be the source of the pain, cholelithiasis, hepatic steatosis, right inguinal hernia containing a portion of the urinary bladder, diverticulosis without diverticulitis, and coronary artery  calcifications.  Discussed the results of the CT with the patient this morning, he reports that he has an extensive history of kidney stones.  Stating that he has constantly being seen by urology for kidney stones and treatment.  He states that he already has a urologist that he sees relatively frequently, instructed him to follow-up with his urologist as an outpatient for both treatment of his kidney stone and to make his urologist aware of his right inguinal hernia.  The patient is not currently having any pain.  Discussed that he can do his normal routine of taking an NSAID and Flomax if needed for pain and to aid with passing of the kidney stone over the next couple of days. The patient reported that he already has these medications at home due to extensive history of prior kidney stones. He agrees to following up with either his urologist or PCP.   Dr. Flora Lipps saw and examined the patient in the morning and deemed he was medically stable for discharge.  Follow-up with outpatient cardiology has already been arranged.  The only medication change at discharge will be the addition of aspirin 81 mg daily and Tylenol 650 mg TID x 7 days per MD. Otherwise he will continue on all of his prior to admission medications.    Did the patient have an acute coronary syndrome (MI, NSTEMI, STEMI, etc) this admission?:  No                               Did the patient have a percutaneous coronary intervention (stent / angioplasty)?:  No.                              _____________  Physical Exam .   GEN: No acute distress.   Neck: No JVD Cardiac: RRR, no murmurs, rubs, or gallops.  Respiratory: Clear to auscultation bilaterally. GI: Soft, nontender, non-distended  MS: No edema  Discharge Vitals Blood pressure 134/79, pulse 61, temperature 97.9 F (36.6 C), temperature source Oral, resp. rate 16, height 5\' 7"  (1.702 m), weight 98.2 kg, SpO2 96%.  Filed Weights   07/04/23 1847  Weight: 98.2 kg   Labs &  Radiologic Studies    CBC Recent Labs    07/04/23 1613  WBC 6.7  NEUTROABS 3.8  HGB 13.7  HCT 41.3  MCV 90.8  PLT 197   Basic Metabolic Panel Recent Labs    60/45/40 0433 07/05/23 2027  NA 140 143  K 3.6 3.9  CL 109 110  CO2 24 20*  GLUCOSE 155* 98  BUN 20 18  CREATININE 1.28* 1.21  CALCIUM 9.0 8.7*   Liver Function Tests Recent Labs    07/04/23 1613 07/05/23 2027  AST 21 18  ALT 20 20  ALKPHOS 72 58  BILITOT 0.8 0.9  PROT 6.2* 5.7*  ALBUMIN 3.5 3.2*   Recent Labs    07/05/23 2027  LIPASE 27   High Sensitivity Troponin:   Recent Labs  Lab 07/02/23 1652 07/02/23 1902 07/04/23 1613 07/04/23 2103  TROPONINIHS  15 13 14 15     BNP Invalid input(s): "POCBNP" D-Dimer No results for input(s): "DDIMER" in the last 72 hours. Hemoglobin A1C Recent Labs    07/06/23 0505  HGBA1C 6.9*   Fasting Lipid Panel Recent Labs    07/06/23 0505  CHOL 123  HDL 34*  LDLCALC 55  TRIG 782*  CHOLHDL 3.6   Thyroid Function Tests Recent Labs    07/06/23 0505  TSH 1.565  _____________  CT CHEST ABDOMEN PELVIS W CONTRAST Result Date: 07/05/2023 CLINICAL DATA:  Aortic aneurysm suspected. EXAM: CT CHEST, ABDOMEN, AND PELVIS WITH CONTRAST TECHNIQUE: Multidetector CT imaging of the chest, abdomen and pelvis was performed following the standard protocol during bolus administration of intravenous contrast. RADIATION DOSE REDUCTION: This exam was performed according to the departmental dose-optimization program which includes automated exposure control, adjustment of the mA and/or kV according to patient size and/or use of iterative reconstruction technique. CONTRAST:  75mL OMNIPAQUE IOHEXOL 350 MG/ML SOLN COMPARISON:  02/25/2023, 10/31/2015. FINDINGS: CT CHEST FINDINGS Cardiovascular: Heart is normal in size and there is no pericardial effusion. A few scattered coronary artery calcifications are noted. There is atherosclerotic calcification of the aorta without evidence of  aneurysm. Pulmonary trunk is mildly distended suggesting underlying pulmonary artery hypertension. Mediastinum/Nodes: A nonspecific enlarged lymph node is noted in the pretracheal space measuring 1.2 cm. No hilar or axillary lymphadenopathy is seen. Thyroid gland is not seen and surgical clips are present in the thyroid bed. The trachea and esophagus are within normal limits. Lungs/Pleura: Atelectasis or scarring is present bilaterally. No consolidation, effusion, or pneumothorax is seen. Musculoskeletal: Degenerative changes are present in the thoracic spine. No acute osseous abnormality is seen. CT ABDOMEN PELVIS FINDINGS Hepatobiliary: No focal liver abnormality is seen. Fatty infiltration of the liver is noted. Stones are present within the gallbladder. No biliary ductal dilatation. Pancreas: Unremarkable. No pancreatic ductal dilatation or surrounding inflammatory changes. Spleen: Normal in size without focal abnormality. Adrenals/Urinary Tract: The adrenal glands are within normal limits. The kidneys enhance symmetrically. Renal cortical scarring is noted on the right. There is a cyst in the mid left kidney. Calculus is noted in the lower pole of the left kidney. Excreted contrast is seen at the renal pyramids. No hydroureteronephrosis bilaterally. Partially distended urinary bladder which herniates into a right inguinal hernia. Stomach/Bowel: Stomach is within normal limits. Appendix appears normal. No evidence of bowel wall thickening, distention, or inflammatory changes. No free air or pneumatosis. Scattered diverticula are present along the colon without evidence of diverticulitis. Vascular/Lymphatic: Aortic atherosclerosis without evidence of aneurysm. No abdominal or pelvic lymphadenopathy. Reproductive: Status post prostatectomy. Other: A trace amount ascites is noted in the pelvis. Right inguinal hernia containing a portion of the urinary bladder. Musculoskeletal: Degenerative changes are present in  the lumbar spine. 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 IMPRESSION: 1. No acute process in the chest, abdomen, or pelvis. 2. Aortic atherosclerosis without evidence of aneurysm. 3. Nonobstructive left renal calculus. 4. Cholelithiasis. 5. Hepatic steatosis. 6. Right inguinal hernia containing a portion of the urinary bladder. 7. Diverticulosis without diverticulitis. 8. Coronary artery calcifications. Electronically Signed   By: Thornell Sartorius M.D.   On: 07/05/2023 21:27   CARDIAC CATHETERIZATION Result Date: 07/05/2023   Prox LAD to Mid LAD lesion is 45% stenosed.   LV end diastolic pressure is mildly elevated. Nonobstructive disease in the mid LAD Mildly elevated LVEDP 19 mm Hg Plan: consider other causes of chest pain.   ECHOCARDIOGRAM COMPLETE Result Date:  07/05/2023    ECHOCARDIOGRAM REPORT   Patient Name:   [name] [name] Date of Exam: 07/05/2023 Medical Rec #:  [id number]        Height:       67.0 in Accession #:    [id number]       Weight:       216.5 lb Date of Birth:  Jan 11, 1951        BSA:          2.092 m Patient Age:    73 years         BP:           118/102 mmHg Patient Gender: M                HR:           58 bpm. Exam Location:  Inpatient Procedure: 2D Echo, 3D Echo, Cardiac Doppler, Color Doppler and Strain Analysis            (Both Spectral and Color Flow Doppler were utilized during            procedure). Indications:    Chest Pain  History:        Patient has prior history of Echocardiogram examinations, most                 recent 03/25/2020. Risk Factors:Hypertension, Diabetes,                 Dyslipidemia and Sleep Apnea.  Sonographer:    Karma Ganja Referring Phys: HAO MENG  Sonographer Comments: Global longitudinal strain was attempted. IMPRESSIONS  1. Left ventricular ejection fraction, by estimation, is 45 to 50%. Left ventricular ejection fraction by 3D volume is 46 %. The left ventricle has mildly decreased function. The left ventricle has no regional wall  motion abnormalities. The average left  ventricular global longitudinal strain is -13.2 %. The global longitudinal strain is abnormal.  2. Right ventricular systolic function is normal. The right ventricular size is mildly enlarged.  3. The mitral valve is normal in structure. No evidence of mitral valve regurgitation. No evidence of mitral stenosis.  4. The aortic valve is normal in structure. Aortic valve regurgitation is not visualized. No aortic stenosis is present.  5. The inferior vena cava is normal in size with greater than 50% respiratory variability, suggesting right atrial pressure of 3 mmHg. FINDINGS  Left Ventricle: Left ventricular ejection fraction, by estimation, is 45 to 50%. Left ventricular ejection fraction by 3D volume is 46 %. The left ventricle has mildly decreased function. The left ventricle has no regional wall motion abnormalities. The  average left ventricular global longitudinal strain is -13.2 %. Strain was performed and the global longitudinal strain is abnormal. The left ventricular internal cavity size was normal in size. There is no left ventricular hypertrophy. Right Ventricle: The right ventricular size is mildly enlarged. No increase in right ventricular wall thickness. Right ventricular systolic function is normal. Left Atrium: Left atrial size was normal in size. Right Atrium: Right atrial size was normal in size. Pericardium: There is no evidence of pericardial effusion. Mitral Valve: The mitral valve is normal in structure. No evidence of mitral valve regurgitation. No evidence of mitral valve stenosis. Tricuspid Valve: The tricuspid valve is normal in structure. Tricuspid valve regurgitation is not demonstrated. No evidence of tricuspid stenosis. Aortic Valve: The aortic valve is normal in structure. Aortic valve regurgitation is not visualized. No aortic stenosis is present. Aortic valve mean gradient  measures 3.0 mmHg. Aortic valve peak gradient measures 6.2 mmHg. Aortic  valve area, by VTI measures 2.25 cm. Pulmonic Valve: The pulmonic valve was normal in structure. Pulmonic valve regurgitation is not visualized. No evidence of pulmonic stenosis. Aorta: The aortic root is normal in size and structure. Venous: The inferior vena cava is normal in size with greater than 50% respiratory variability, suggesting right atrial pressure of 3 mmHg. IAS/Shunts: No atrial level shunt detected by color flow Doppler.  LEFT VENTRICLE PLAX 2D LVIDd:         5.00 cm         Diastology LVIDs:         3.60 cm         LV e' medial:    7.62 cm/s LV PW:         1.20 cm         LV E/e' medial:  13.6 LV IVS:        1.20 cm         LV e' lateral:   11.90 cm/s LVOT diam:     2.10 cm         LV E/e' lateral: 8.7 LV SV:         63 LV SV Index:   30              2D Longitudinal LVOT Area:     3.46 cm        Strain                                2D Strain GLS   -13.2 %                                Avg:                                 3D Volume EF                                LV 3D EF:    Left                                             ventricul                                             ar                                             ejection                                             fraction  by 3D                                             volume is                                             46 %.                                 3D Volume EF:                                3D EF:        46 %                                LV EDV:       176 ml                                LV ESV:       95 ml                                LV SV:        81 ml RIGHT VENTRICLE            IVC RV Basal diam:  4.50 cm    IVC diam: 2.20 cm RV S prime:     8.38 cm/s TAPSE (M-mode): 1.6 cm LEFT ATRIUM             Index        RIGHT ATRIUM           Index LA diam:        4.10 cm 1.96 cm/m   RA Area:     20.00 cm LA Vol (A2C):   88.9 ml 42.50 ml/m  RA Volume:   58.70 ml  28.07  ml/m LA Vol (A4C):   63.0 ml 30.12 ml/m LA Biplane Vol: 76.7 ml 36.67 ml/m  AORTIC VALVE AV Area (Vmax):    2.28 cm AV Area (Vmean):   2.24 cm AV Area (VTI):     2.25 cm AV Vmax:           125.00 cm/s AV Vmean:          82.700 cm/s AV VTI:            0.279 m AV Peak Grad:      6.2 mmHg AV Mean Grad:      3.0 mmHg LVOT Vmax:         82.30 cm/s LVOT Vmean:        53.500 cm/s LVOT VTI:          0.181 m LVOT/AV VTI ratio: 0.65  AORTA Ao Root diam: 4.20 cm Ao Asc diam:  3.40 cm MITRAL VALVE MV Area (PHT): 3.46 cm     SHUNTS MV Decel Time: 219 msec     Systemic VTI:  0.18 m MV E velocity:  104.00 cm/s  Systemic Diam: 2.10 cm MV A velocity: 45.10 cm/s MV E/A ratio:  2.31 Donato Schultz MD Electronically signed by Donato Schultz MD Signature Date/Time: 07/05/2023/12:06:34 PM    Final    DG Chest Portable 1 View Result Date: 07/02/2023 CLINICAL DATA:  Chest pain. EXAM: PORTABLE CHEST 1 VIEW COMPARISON:  Lymph 1624 and CT chest 01/08/2011. FINDINGS: Trachea is midline. Heart size stable. Lungs are clear. No pleural fluid. IMPRESSION: No acute findings. Electronically Signed   By: Leanna Battles M.D.   On: 07/02/2023 16:27   Disposition   Pt is being discharged home today in good condition per MD.  Follow-up Plans & Appointments   Future Appointments  Date Time Provider Department Center  07/26/2023  2:20 PM Azalee Course, [place] [facility] None   Instructed patient to follow-up with his urologist for his left renal stone, right inguinal hernia Discharge Instructions     Call MD for:  persistant dizziness or light-headedness   Complete by: As directed    Call MD for:  redness, tenderness, or signs of infection (pain, swelling, redness, odor or green/yellow discharge around incision site)   Complete by: As directed    Discharge instructions   Complete by: As directed    Radial Site Care Refer to this sheet in the next few weeks. These instructions provide you with information on caring for yourself after your  procedure. Your caregiver may also give you more specific instructions. Your treatment has been planned according to current medical practices, but problems sometimes occur. Call your caregiver if you have any problems or questions after your procedure.  HOME CARE INSTRUCTIONS You may shower the day after the procedure. Remove the bandage (dressing) and gently wash the site with plain soap and water. Gently pat the site dry.  Do not apply powder or lotion to the site.  Do not submerge the affected site in water for 3 to 5 days.  Inspect the site at least twice daily.  Do not flex or bend the affected arm for 24 hours.  No lifting over 5 pounds (2.3 kg) for 5 days after your procedure.  Do not drive home if you are discharged the same day of the procedure. Have someone else drive you.  You may drive 24 hours after the procedure unless otherwise instructed by your caregiver.   What to expect: Any bruising will usually fade within 1 to 2 weeks.  Blood that collects in the tissue (hematoma) may be painful to the touch. It should usually decrease in size and tenderness within 1 to 2 weeks.   SEEK IMMEDIATE MEDICAL CARE IF: You have unusual pain at the radial site.  You have redness, warmth, swelling, or pain at the radial site.  You have drainage (other than a small amount of blood on the dressing).  You have chills.  You have a fever or persistent symptoms for more than 72 hours.  You have a fever and your symptoms suddenly get worse.  Your arm becomes pale, cool, tingly, or numb.  You have heavy bleeding from the site. Hold pressure on the site.   Groin Site Care Refer to this sheet in the next few weeks. These instructions provide you with information on caring for yourself after your procedure. Your caregiver may also give you more specific instructions. Your treatment has been planned according to current medical practices, but problems sometimes occur. Call your caregiver if you have any  problems or questions after your procedure.  HOME  CARE INSTRUCTIONS You may shower 24 hours after the procedure. Remove the bandage (dressing) and gently wash the site with plain soap and water. Gently pat the site dry.  Do not apply powder or lotion to the site.  Do not sit in a bathtub, swimming pool, or whirlpool for 5 to 7 days.  No bending, squatting, or lifting anything over 10 pounds (4.5 kg) as directed by your caregiver.  Inspect the site at least twice daily.  Do not drive home if you are discharged the same day of the procedure. Have someone else drive you.  You may drive 24 hours after the procedure unless otherwise instructed by your caregiver.   What to expect: Any bruising will usually fade within 1 to 2 weeks.  Blood that collects in the tissue (hematoma) may be painful to the touch. It should usually decrease in size and tenderness within 1 to 2 weeks.   SEEK IMMEDIATE MEDICAL CARE IF: You have unusual pain at the groin site or down the affected leg.  You have redness, warmth, swelling, or pain at the groin site.  You have drainage (other than a small amount of blood on the dressing).  You have chills.  You have a fever or persistent symptoms for more than 72 hours.  You have a fever and your symptoms suddenly get worse.  Your leg becomes pale, cool, tingly, or numb.  You have heavy bleeding from the site. Hold pressure on the site.  Reach out to your urologist or PCP in regards to follow up for kidney stone vs. possible MSK pain      Discharge Medications   Allergies as of 07/06/2023       Reactions   Bystolic [nebivolol Hcl] Swelling, Other (See Comments)   Bradycardia   Calcium Channel Blockers Swelling, Other (See Comments)   LE edema   Feraheme [ferumoxytol] Shortness Of Breath, Nausea And Vomiting, Other (See Comments)   Chest pain   Phenergan [promethazine Hcl] Itching, Other (See Comments)   Hallucination   Beta Adrenergic Blockers Swelling, Other  (See Comments)   bradycardia   Cardizem [diltiazem] Other (See Comments)   Foot swelling and increased his heart rate   Norvasc [amlodipine] Other (See Comments)   Unknown reaction   Aspirin Other (See Comments)   Hx of ulcer   Nsaids Other (See Comments)   Hx ulcer   Prednisone Other (See Comments)   Hx of ulcer        Medication List     TAKE these medications    acetaminophen 500 MG tablet Commonly known as: TYLENOL Take 1,000 mg by mouth every 6 (six) hours as needed for moderate pain or headache. What changed: Another medication with the same name was added. Make sure you understand how and when to take each.   acetaminophen 325 MG tablet Commonly known as: TYLENOL Take 2 tablets (650 mg total) by mouth in the morning, at noon, and at bedtime for 7 days. What changed: You were already taking a medication with the same name, and this prescription was added. Make sure you understand how and when to take each.   albuterol 108 (90 Base) MCG/ACT inhaler Commonly known as: VENTOLIN HFA Inhale 2 puffs into the lungs every 6 (six) hours as needed for wheezing or shortness of breath.   aspirin EC 81 MG tablet Take 1 tablet (81 mg total) by mouth daily. Swallow whole.   carvedilol 12.5 MG tablet Commonly known as: COREG TAKE 3 TABLETS  TWICE A DAY   cyanocobalamin 1000 MCG tablet Commonly known as: VITAMIN B12 Take 1,000 mcg by mouth daily.   DULoxetine 60 MG capsule Commonly known as: CYMBALTA Take 60 mg by mouth daily.   Eliquis 5 MG Tabs tablet Generic drug: apixaban Take 1 tablet by mouth twice daily   esomeprazole 40 MG capsule Commonly known as: NEXIUM Take 40 mg by mouth daily.   folic acid 1 MG tablet Commonly known as: FOLVITE Take 1 mg by mouth daily.   Glucosamine HCl 1500 MG Tabs Take 1,500 mg by mouth 2 (two) times daily.   iron polysaccharides 150 MG capsule Commonly known as: NIFEREX Take 150 mg by mouth daily.   Jardiance 25 MG Tabs  tablet Generic drug: empagliflozin Take 25 mg by mouth daily.   levothyroxine 175 MCG tablet Commonly known as: SYNTHROID Take 175 mcg by mouth daily.   losartan 50 MG tablet Commonly known as: COZAAR TAKE 1 TABLET EVERY EVENING   metFORMIN 500 MG 24 hr tablet Commonly known as: GLUCOPHAGE-XR Take 1,000 mg by mouth 2 (two) times daily.   Multaq 400 MG tablet Generic drug: dronedarone Take 1 tablet (400 mg total) by mouth 2 (two) times daily with a meal.   multivitamin with minerals Tabs tablet Take 1 tablet by mouth daily. One-A-Day for Men   nitroGLYCERIN 0.4 MG SL tablet Commonly known as: NITROSTAT Place 0.4 mg under the tongue every 5 (five) minutes as needed for chest pain.   omeprazole 40 MG capsule Commonly known as: PRILOSEC Take 40 mg by mouth daily.   oxyCODONE-acetaminophen 5-325 MG tablet Commonly known as: PERCOCET/ROXICET Take 1-2 tablets by mouth every 6 (six) hours as needed for moderate pain (pain score 4-6).   PreserVision AREDS 2 Caps Take 1 capsule by mouth 2 (two) times daily.   PROBIOTIC PO Take 1 capsule by mouth at bedtime.   rosuvastatin 10 MG tablet Commonly known as: CRESTOR Take 1 tablet (10 mg total) by mouth daily.   Trulicity 1.5 MG/0.5ML Soaj Generic drug: Dulaglutide Inject 1.5 mg into the skin once a week.   Vitamin D (Ergocalciferol) 1.25 MG (50000 UNIT) Caps capsule Commonly known as: DRISDOL Take 50,000 Units by mouth every 30 (thirty) days.         Duration of Discharge Encounter: APP Time: 30 minutes   Signed, Olena Leatherwood, PA-C 07/06/2023, 8:34 AM

## 2023-07-05 NOTE — Progress Notes (Signed)
   Patient Name: [name] [name] Date of Encounter: 07/05/2023 [facility] Cardiologist: Chilton Si, MD   Interval Summary  .    Patient reports feeling good Slightly anxious about his cath today but ready  Was undergoing echocardiogram as I entered the room  Vital Signs .    Vitals:   07/04/23 2127 07/04/23 2334 07/05/23 0300 07/05/23 0707  BP: (!) 171/89 (!) 156/85 (!) 118/102 139/82  Pulse: 65 65 80 60  Resp:  18  13  Temp:  98.2 F (36.8 C) 98.2 F (36.8 C) 98 F (36.7 C)  TempSrc:  Oral Oral Oral  SpO2:  93%  98%  Weight:      Height:        Intake/Output Summary (Last 24 hours) at 07/05/2023 0851 Last data filed at 07/05/2023 0300 Gross per 24 hour  Intake --  Output 1300 ml  Net -1300 ml      07/04/2023    6:47 PM 07/04/2023   10:43 AM 07/02/2023    3:44 PM  Last 3 Weights  Weight (lbs) 216 lb 8 oz 219 lb 219 lb  Weight (kg) 98.204 kg 99.338 kg 99.338 kg      Telemetry/ECG    Sinus rhythm, HR 60-70s, some PVCs - Personally Reviewed  Physical Exam .   GEN: No acute distress.   Neck: No JVD Cardiac: RRR, no murmurs, rubs, or gallops.  Respiratory: Clear to auscultation bilaterally. GI: Soft, nontender, non-distended  MS: No edema  Assessment & Plan .    [name] [name] is a 73 y.o. male with atrial fibrillation s/p ablation x2 [facility], most recent 2016), HFpEF, HTN, HLD, DM II and prior prostate cancer  who is being seen 07/04/2023 for the evaluation of chest pain. He was sent as direct admit from our outpatient office and scheduled for cardiac cath on 07/05/2023  Chest pain Reported chest pain starting last Thursday that awoke him from sleep then another hour of chest discomfort Sunday morning after walking to church He went to the ED then, EKG was normal, troponin negative x 2 He experienced another episode of chest pain while in the ED Presented for follow up in our outpatient office, EKG in office showed dramatic changes, minimal ST  depression, significant T wave inversions in inferolateral leads Repeat EKG showed resolution of ST depression but TWI persisted  Follow up EKG in hospital, showed resolved TWI  He was then sent, direct admit, to [facility] for heart cath 3/26 Continue ASA 81 mg daily Scheduled for cardiac cath afternoon 3/26  NPO since midnight  Updated echocardiogram was being obtained, pending results   History of atrial fibrillation s/p ablation x2 Currently maintaining NSR Was on dronedarone and Eliquis  Last dose Eliquis 7 AM 3/25 Scheduled for cath afternoon 3/26 -- hold Eliquis until after cath Continue dronedarone 400 mg BID Restart Elqiuis after cath   Hypertension BP well controlled Continue losartan 50 mg daily Continue CoReg 37.5 mg BID  Hyperlipidemia Lipid panel from 02/2023: total 122, HDL 39, LDL 58, trigs 147 Continue Crestor 10 mg daily  Type 2 diabetes mellitus A1C from 02/2023 was 6.7 Continue with SSI while inpatient    Hypothyroidism Continue home levothyroxine   For questions or updates, please contact [facility] Please consult [email or web address] for contact info under        Signed, Olena Leatherwood, PA-C

## 2023-07-05 NOTE — Interval H&P Note (Signed)
 History and Physical Interval Note:  07/05/2023 2:37 PM  [name] [name]  has presented today for surgery, with the diagnosis of unstable angina.  The various methods of treatment have been discussed with the patient and family. After consideration of risks, benefits and other options for treatment, the patient has consented to  Procedure(s): LEFT HEART CATH AND CORONARY ANGIOGRAPHY (N/A) as a surgical intervention.  The patient's history has been reviewed, patient examined, no change in status, stable for surgery.  I have reviewed the patient's chart and labs.  Questions were answered to the patient's satisfaction.   Cath Lab Visit (complete for each Cath Lab visit)  Clinical Evaluation Leading to the Procedure:   ACS: Yes.    Non-ACS:    Anginal Classification: CCS IV  Anti-ischemic medical therapy: Maximal Therapy (2 or more classes of medications)  Non-Invasive Test Results: No non-invasive testing performed  Prior CABG: No previous CABG        Theron Arista [facility] 07/05/2023 2:37 PM

## 2023-07-05 NOTE — Progress Notes (Signed)
 Pt complaining of 10/10 pain that radiates from chest to left flank area. Pt crying in pain and grabbing area. Pt reports pain as "stabbing like a knife". Md notified and IV morphine ordered and given. Pt reported no relief and continued to scream out in pain. Md notified and on the way to bedside to assess. No new orders at this time. Will continue to assess.

## 2023-07-06 ENCOUNTER — Encounter (HOSPITAL_BASED_OUTPATIENT_CLINIC_OR_DEPARTMENT_OTHER): Payer: Self-pay | Admitting: Cardiovascular Disease

## 2023-07-06 ENCOUNTER — Encounter (HOSPITAL_COMMUNITY): Payer: Self-pay | Admitting: Cardiology

## 2023-07-06 ENCOUNTER — Other Ambulatory Visit (HOSPITAL_COMMUNITY): Payer: Self-pay

## 2023-07-06 DIAGNOSIS — E785 Hyperlipidemia, unspecified: Secondary | ICD-10-CM | POA: Diagnosis not present

## 2023-07-06 DIAGNOSIS — I480 Paroxysmal atrial fibrillation: Secondary | ICD-10-CM | POA: Diagnosis not present

## 2023-07-06 DIAGNOSIS — Z8679 Personal history of other diseases of the circulatory system: Secondary | ICD-10-CM

## 2023-07-06 DIAGNOSIS — Z8546 Personal history of malignant neoplasm of prostate: Secondary | ICD-10-CM | POA: Diagnosis not present

## 2023-07-06 DIAGNOSIS — E039 Hypothyroidism, unspecified: Secondary | ICD-10-CM | POA: Diagnosis not present

## 2023-07-06 DIAGNOSIS — I25110 Atherosclerotic heart disease of native coronary artery with unstable angina pectoris: Secondary | ICD-10-CM | POA: Diagnosis not present

## 2023-07-06 DIAGNOSIS — R079 Chest pain, unspecified: Secondary | ICD-10-CM

## 2023-07-06 DIAGNOSIS — I4892 Unspecified atrial flutter: Secondary | ICD-10-CM | POA: Diagnosis not present

## 2023-07-06 DIAGNOSIS — I200 Unstable angina: Secondary | ICD-10-CM | POA: Diagnosis not present

## 2023-07-06 DIAGNOSIS — Z9889 Other specified postprocedural states: Secondary | ICD-10-CM

## 2023-07-06 DIAGNOSIS — E118 Type 2 diabetes mellitus with unspecified complications: Secondary | ICD-10-CM | POA: Diagnosis not present

## 2023-07-06 LAB — TSH: TSH: 1.565 u[IU]/mL (ref 0.350–4.500)

## 2023-07-06 LAB — HEMOGLOBIN A1C
Hgb A1c MFr Bld: 6.9 % — ABNORMAL HIGH (ref 4.8–5.6)
Mean Plasma Glucose: 151.33 mg/dL

## 2023-07-06 LAB — LIPID PANEL
Cholesterol: 123 mg/dL (ref 0–200)
HDL: 34 mg/dL — ABNORMAL LOW (ref >40)
LDL Cholesterol: 55 mg/dL (ref 0–99)
Total CHOL/HDL Ratio: 3.6 ratio
Triglycerides: 168 mg/dL — ABNORMAL HIGH (ref <150)
VLDL: 34 mg/dL (ref 0–40)

## 2023-07-06 LAB — GLUCOSE, CAPILLARY: Glucose-Capillary: 116 mg/dL — ABNORMAL HIGH (ref 70–99)

## 2023-07-06 MED ORDER — ASPIRIN 81 MG PO TBEC
81.0000 mg | DELAYED_RELEASE_TABLET | Freq: Every day | ORAL | 3 refills | Status: DC
  Filled 2023-07-06: qty 90, 90d supply, fill #0

## 2023-07-06 MED ORDER — ACETAMINOPHEN 325 MG PO TABS
650.0000 mg | ORAL_TABLET | Freq: Three times a day (TID) | ORAL | 0 refills | Status: AC
  Filled 2023-07-06: qty 42, 7d supply, fill #0

## 2023-07-06 MED FILL — Heparin Sodium (Porcine) Inj 1000 Unit/ML: INTRAMUSCULAR | Qty: 10 | Status: AC

## 2023-07-06 NOTE — Plan of Care (Signed)
 Problem: Education: Goal: Knowledge of General Education information will improve Description: Including pain rating scale, medication(s)/side effects and non-pharmacologic comfort measures Outcome: Adequate for Discharge   Problem: Health Behavior/Discharge Planning: Goal: Ability to manage health-related needs will improve Outcome: Adequate for Discharge   Problem: Clinical Measurements: Goal: Ability to maintain clinical measurements within normal limits will improve Outcome: Adequate for Discharge Goal: Will remain free from infection Outcome: Adequate for Discharge Goal: Diagnostic test results will improve Outcome: Adequate for Discharge Goal: Respiratory complications will improve Outcome: Adequate for Discharge Goal: Cardiovascular complication will be avoided Outcome: Adequate for Discharge   Problem: Activity: Goal: Risk for activity intolerance will decrease Outcome: Adequate for Discharge   Problem: Nutrition: Goal: Adequate nutrition will be maintained Outcome: Adequate for Discharge   Problem: Coping: Goal: Level of anxiety will decrease Outcome: Adequate for Discharge   Problem: Elimination: Goal: Will not experience complications related to bowel motility Outcome: Adequate for Discharge Goal: Will not experience complications related to urinary retention Outcome: Adequate for Discharge   Problem: Pain Managment: Goal: General experience of comfort will improve and/or be controlled Outcome: Adequate for Discharge   Problem: Safety: Goal: Ability to remain free from injury will improve Outcome: Adequate for Discharge   Problem: Skin Integrity: Goal: Risk for impaired skin integrity will decrease Outcome: Adequate for Discharge   Problem: Education: Goal: Understanding of cardiac disease, CV risk reduction, and recovery process will improve Outcome: Adequate for Discharge Goal: Individualized Educational Video(s) Outcome: Adequate for Discharge    Problem: Activity: Goal: Ability to tolerate increased activity will improve Outcome: Adequate for Discharge   Problem: Cardiac: Goal: Ability to achieve and maintain adequate cardiovascular perfusion will improve Outcome: Adequate for Discharge   Problem: Health Behavior/Discharge Planning: Goal: Ability to safely manage health-related needs after discharge will improve Outcome: Adequate for Discharge   Problem: Education: Goal: Ability to describe self-care measures that may prevent or decrease complications (Diabetes Survival Skills Education) will improve Outcome: Adequate for Discharge Goal: Individualized Educational Video(s) Outcome: Adequate for Discharge   Problem: Coping: Goal: Ability to adjust to condition or change in health will improve Outcome: Adequate for Discharge   Problem: Fluid Volume: Goal: Ability to maintain a balanced intake and output will improve Outcome: Adequate for Discharge   Problem: Health Behavior/Discharge Planning: Goal: Ability to identify and utilize available resources and services will improve Outcome: Adequate for Discharge Goal: Ability to manage health-related needs will improve Outcome: Adequate for Discharge   Problem: Metabolic: Goal: Ability to maintain appropriate glucose levels will improve Outcome: Adequate for Discharge   Problem: Nutritional: Goal: Maintenance of adequate nutrition will improve Outcome: Adequate for Discharge Goal: Progress toward achieving an optimal weight will improve Outcome: Adequate for Discharge   Problem: Skin Integrity: Goal: Risk for impaired skin integrity will decrease Outcome: Adequate for Discharge   Problem: Tissue Perfusion: Goal: Adequacy of tissue perfusion will improve Outcome: Adequate for Discharge   Problem: Education: Goal: Understanding of CV disease, CV risk reduction, and recovery process will improve Outcome: Adequate for Discharge Goal: Individualized Educational  Video(s) Outcome: Adequate for Discharge   Problem: Activity: Goal: Ability to return to baseline activity level will improve Outcome: Adequate for Discharge   Problem: Cardiovascular: Goal: Ability to achieve and maintain adequate cardiovascular perfusion will improve Outcome: Adequate for Discharge Goal: Vascular access site(s) Level 0-1 will be maintained Outcome: Adequate for Discharge   Problem: Health Behavior/Discharge Planning: Goal: Ability to safely manage health-related needs after discharge will improve Outcome: Adequate for  Discharge

## 2023-07-06 NOTE — Plan of Care (Signed)
   Problem: Education: Goal: Knowledge of General Education information will improve Description Including pain rating scale, medication(s)/side effects and non-pharmacologic comfort measures Outcome: Progressing   Problem: Health Behavior/Discharge Planning: Goal: Ability to manage health-related needs will improve Outcome: Progressing

## 2023-07-07 LAB — LIPOPROTEIN A (LPA): Lipoprotein (a): 22.8 nmol/L (ref <75.0)

## 2023-07-24 DIAGNOSIS — N281 Cyst of kidney, acquired: Secondary | ICD-10-CM | POA: Diagnosis not present

## 2023-07-24 DIAGNOSIS — N200 Calculus of kidney: Secondary | ICD-10-CM | POA: Diagnosis not present

## 2023-07-26 ENCOUNTER — Ambulatory Visit: Attending: Physician Assistant | Admitting: Physician Assistant

## 2023-07-26 ENCOUNTER — Encounter: Payer: Self-pay | Admitting: Physician Assistant

## 2023-07-26 VITALS — BP 130/78 | HR 65 | Ht 67.0 in | Wt 221.0 lb

## 2023-07-26 DIAGNOSIS — I480 Paroxysmal atrial fibrillation: Secondary | ICD-10-CM | POA: Diagnosis not present

## 2023-07-26 DIAGNOSIS — I10 Essential (primary) hypertension: Secondary | ICD-10-CM | POA: Insufficient documentation

## 2023-07-26 DIAGNOSIS — I2510 Atherosclerotic heart disease of native coronary artery without angina pectoris: Secondary | ICD-10-CM | POA: Insufficient documentation

## 2023-07-26 DIAGNOSIS — E785 Hyperlipidemia, unspecified: Secondary | ICD-10-CM | POA: Insufficient documentation

## 2023-07-26 NOTE — Progress Notes (Signed)
 Cardiology Office Note:  .   Date:  07/26/2023  ID:  [name] [name], DOB 1950-06-11, MRN [id number] PCP: Bertha Broad, MD  [facility] Providers Cardiologist:  Maudine Sos, MD     History of Present Illness: .   [name] [name] is a 73 y.o. male with PMH of atrial fibrillation s/p ablation x2 [facility], most recent 2016), HFpEF, HTN, HLD, DM II and prior prostate cancer.  Most recent ablation at [facility] was in 2016, this was complicated by cardiogenic shock required 4 days in the ICU and intubation.  He was seen in December 2021 for recurrent atrial flutter after surgery.  He underwent TEE DCCV in 03/2020.  He was started back on dronedarone.  He had recurrent rate controlled atrial fibrillation in February 2022 triggered by UTI.  He underwent successful cardioversion in February 2022.  He was admitted with symptomatic anemia secondary to upper GI bleeding in 2023.  EGD revealed nonbleeding gastric ulcer which was treated with epi injection and 3 clips.  The Xarelto was held for 5 days postprocedure.  Xarelto was later switched to Eliquis in December 2023.  He was not interested in [place] procedure.  He was last seen by Dr. Theodis Fiscal in June 2024 at which time he was doing well and maintaining sinus rhythm.  Since the last visit, patient was admitted with acute bronchitis in November 2024.  Viral panel positive for rhinovirus.  He was treated with Solu-Medrol.  BNP only 161.  Although discharge note mention patient was in A-fib, however EKG was personally viewed by myself showed sinus rhythm.  More recently, patient went to the emergency room on 07/02/2023 after waking up with chest pain.  Symptoms was substernal and lasted about an hour before resolving by itself.  He called cardiology service to schedule appointment, however by Sunday morning he had another episode of chest pain walking to the garage.  This prompted him to seek urgent medical attention in the emergency room.  He had a third  episode of chest pain while in the emergency room.  Chest x-ray was normal.  Basic metabolic panel showed a creatinine of 1.38, no significant electrolyte abnormality.  Serial troponin 15--> 13.  Chest pain slightly worse with deep inspiration.   I last saw the patient on 07/04/2023, he had a traumatic T wave inversion in the inferolateral leads with minimal ST depression.  Given his symptoms and abnormal EKG finding, we ultimately decided to directly admit him to the hospital for cardiac catheterization.  Eliquis was held.  Echocardiogram obtained on 07/05/2023 showed EF 45 to 50%, no regional wall motion abnormality, no significant valve issue.  Echocardiogram was reviewed personally by Dr. Rolm Clos who felt the ejection fraction is closer to 50 to 55%.  Cardiac catheterization performed on the same day showed 45% proximal to mid LAD lesion, LVEDP mildly elevated at 19 mmHg.  Postprocedure, he had intense chest pain.  CT of chest abdomen pelvis showed no acute process, nonobstructive left renal calculus, cholelithiasis, hepatic steatosis, right inguinal hernia containing a portion of the urinary bladder, diverticulosis without diverticulitis.  He was given GI cocktail and morphine.  Patient presents today for follow-up.  He denies any chest pain or shortness of breath.  He was discharged on aspirin and Eliquis, at this time, given nonobstructive disease, I do not think he needs aspirin.  I will discontinue his aspirin.  He has no lower extremity edema, orthopnea or PND.  He overall, he has been doing well  and that he can follow-up with Dr. Theodis Fiscal in 9 months.   ROS:   He denies chest pain, palpitations, dyspnea, pnd, orthopnea, n, v, dizziness, syncope, edema, weight gain, or early satiety. All other systems reviewed and are otherwise negative except as noted above.    Studies Reviewed: .        Cardiac Studies & Procedures    ______________________________________________________________________________________________ CARDIAC CATHETERIZATION  CARDIAC CATHETERIZATION 07/05/2023  Conclusion   Prox LAD to Mid LAD lesion is 45% stenosed.   LV end diastolic pressure is mildly elevated.  Nonobstructive disease in the mid LAD Mildly elevated LVEDP 19 mm Hg  Plan: consider other causes of chest pain.  Findings Coronary Findings Diagnostic  Dominance: Right  Left Main Vessel was injected. Vessel is normal in caliber. Vessel is angiographically normal.  Left Anterior Descending Prox LAD to Mid LAD lesion is 45% stenosed.  Left Circumflex Vessel was injected. Vessel is normal in caliber. Vessel is angiographically normal.  Right Coronary Artery Vessel was injected. Vessel is normal in caliber. Vessel is angiographically normal.  Intervention  No interventions have been documented.   STRESS TESTS  NM MYOCAR MULTI W/SPECT W 01/10/2011  Narrative *RADIOLOGY REPORT*  Clinical Data:  Chest pain  MYOCARDIAL IMAGING WITH SPECT (REST AND PHARMACOLOGIC-STRESS) GATED LEFT VENTRICULAR WALL MOTION STUDY LEFT VENTRICULAR EJECTION FRACTION  Technique:  Standard myocardial SPECT imaging was performed after resting intravenous injection of 10 mCi Tc-67m tetrofosmin. Subsequently, intravenous infusion of Lexiscan was performed under the supervision of the Cardiology staff.  At peak effect of the drug, 30 mCi Tc-40m tetrofosmin was injected intravenously and standard myocardial SPECT  imaging was performed.  Quantitative gated imaging was also performed to evaluate left ventricular wall motion, and estimate left ventricular ejection fraction.  Comparison:  None  Findings: SPECT imaging demonstrates no reversible or irreversible defects to suggest ischemia or infarct.  Quantitative gated analysis shows normal wall motion.  The resting left ventricular ejection fraction is 58% with end- diastolic volume  of 122 ml and end-systolic volume of 51 ml.  IMPRESSION: No evidence of ischemia or infarct.  Ejection fraction 58%.  Original Report Authenticated By: KEVIN G. DOVER, M.D.   ECHOCARDIOGRAM  ECHOCARDIOGRAM COMPLETE 07/05/2023  Narrative ECHOCARDIOGRAM REPORT    Patient Name:   [name] [name] Date of Exam: 07/05/2023 Medical Rec #:  [id number]        Height:       67.0 in Accession #:    [id number]       Weight:       216.5 lb Date of Birth:  02-13-1951        BSA:          2.092 m Patient Age:    73 years         BP:           118/102 mmHg Patient Gender: M                HR:           58 bpm. Exam Location:  Inpatient  Procedure: 2D Echo, 3D Echo, Cardiac Doppler, Color Doppler and Strain Analysis (Both Spectral and Color Flow Doppler were utilized during procedure).  Indications:    Chest Pain  History:        Patient has prior history of Echocardiogram examinations, most recent 03/25/2020. Risk Factors:Hypertension, Diabetes, Dyslipidemia and Sleep Apnea.  Sonographer:    Reta Cassis Referring Phys: Marthann Abshier   Sonographer Comments: Global  longitudinal strain was attempted. IMPRESSIONS   1. Left ventricular ejection fraction, by estimation, is 45 to 50%. Left ventricular ejection fraction by 3D volume is 46 %. The left ventricle has mildly decreased function. The left ventricle has no regional wall motion abnormalities. The average left ventricular global longitudinal strain is -13.2 %. The global longitudinal strain is abnormal. 2. Right ventricular systolic function is normal. The right ventricular size is mildly enlarged. 3. The mitral valve is normal in structure. No evidence of mitral valve regurgitation. No evidence of mitral stenosis. 4. The aortic valve is normal in structure. Aortic valve regurgitation is not visualized. No aortic stenosis is present. 5. The inferior vena cava is normal in size with greater than 50% respiratory variability, suggesting right  atrial pressure of 3 mmHg.  FINDINGS Left Ventricle: Left ventricular ejection fraction, by estimation, is 45 to 50%. Left ventricular ejection fraction by 3D volume is 46 %. The left ventricle has mildly decreased function. The left ventricle has no regional wall motion abnormalities. The average left ventricular global longitudinal strain is -13.2 %. Strain was performed and the global longitudinal strain is abnormal. The left ventricular internal cavity size was normal in size. There is no left ventricular hypertrophy.  Right Ventricle: The right ventricular size is mildly enlarged. No increase in right ventricular wall thickness. Right ventricular systolic function is normal.  Left Atrium: Left atrial size was normal in size.  Right Atrium: Right atrial size was normal in size.  Pericardium: There is no evidence of pericardial effusion.  Mitral Valve: The mitral valve is normal in structure. No evidence of mitral valve regurgitation. No evidence of mitral valve stenosis.  Tricuspid Valve: The tricuspid valve is normal in structure. Tricuspid valve regurgitation is not demonstrated. No evidence of tricuspid stenosis.  Aortic Valve: The aortic valve is normal in structure. Aortic valve regurgitation is not visualized. No aortic stenosis is present. Aortic valve mean gradient measures 3.0 mmHg. Aortic valve peak gradient measures 6.2 mmHg. Aortic valve area, by VTI measures 2.25 cm.  Pulmonic Valve: The pulmonic valve was normal in structure. Pulmonic valve regurgitation is not visualized. No evidence of pulmonic stenosis.  Aorta: The aortic root is normal in size and structure.  Venous: The inferior vena cava is normal in size with greater than 50% respiratory variability, suggesting right atrial pressure of 3 mmHg.  IAS/Shunts: No atrial level shunt detected by color flow Doppler.   LEFT VENTRICLE PLAX 2D LVIDd:         5.00 cm         Diastology LVIDs:         3.60 cm         LV  e' medial:    7.62 cm/s LV PW:         1.20 cm         LV E/e' medial:  13.6 LV IVS:        1.20 cm         LV e' lateral:   11.90 cm/s LVOT diam:     2.10 cm         LV E/e' lateral: 8.7 LV SV:         63 LV SV Index:   30              2D Longitudinal LVOT Area:     3.46 cm        Strain 2D Strain GLS   -13.2 % Avg:  3D Volume EF  LV 3D EF:    Left ventricul ar ejection fraction by 3D volume is 46 %.  3D Volume EF: 3D EF:        46 % LV EDV:       176 ml LV ESV:       95 ml LV SV:        81 ml  RIGHT VENTRICLE            IVC RV Basal diam:  4.50 cm    IVC diam: 2.20 cm RV S prime:     8.38 cm/s TAPSE (M-mode): 1.6 cm  LEFT ATRIUM             Index        RIGHT ATRIUM           Index LA diam:        4.10 cm 1.96 cm/m   RA Area:     20.00 cm LA Vol (A2C):   88.9 ml 42.50 ml/m  RA Volume:   58.70 ml  28.07 ml/m LA Vol (A4C):   63.0 ml 30.12 ml/m LA Biplane Vol: 76.7 ml 36.67 ml/m AORTIC VALVE AV Area (Vmax):    2.28 cm AV Area (Vmean):   2.24 cm AV Area (VTI):     2.25 cm AV Vmax:           125.00 cm/s AV Vmean:          82.700 cm/s AV VTI:            0.279 m AV Peak Grad:      6.2 mmHg AV Mean Grad:      3.0 mmHg LVOT Vmax:         82.30 cm/s LVOT Vmean:        53.500 cm/s LVOT VTI:          0.181 m LVOT/AV VTI ratio: 0.65  AORTA Ao Root diam: 4.20 cm Ao Asc diam:  3.40 cm  MITRAL VALVE MV Area (PHT): 3.46 cm     SHUNTS MV Decel Time: 219 msec     Systemic VTI:  0.18 m MV E velocity: 104.00 cm/s  Systemic Diam: 2.10 cm MV A velocity: 45.10 cm/s MV E/A ratio:  2.31  Donato Schultz MD Electronically signed by Donato Schultz MD Signature Date/Time: 07/05/2023/12:06:34 PM    Final   TEE  ECHO TEE 03/25/2020  Narrative TRANSESOPHOGEAL ECHO REPORT    Patient Name:   GAR GLANCE Date of Exam: 03/25/2020 Medical Rec #:  [id number]        Height:       68.0 in Accession #:    [id number]       Weight:       207.8 lb Date of Birth:   04-28-1950        BSA:          2.077 m Patient Age:    69 years         BP:           141/104 mmHg Patient Gender: M                HR:           89 bpm. Exam Location:  Outpatient  Procedure: Cardiac Doppler, Color Doppler and Transesophageal Echo  Indications:    Atrial fibrilation  History:        Patient has prior history of Echocardiogram examinations, most recent 11/24/2014.  Sonographer:  Margreta Journey Referring Phys: [phone] DONNA C CARROLL  PROCEDURE: After discussion of the risks and benefits of a TEE, an informed consent was obtained from the patient. The transesophogeal probe was passed without difficulty through the esophogus of the patient. Sedation performed by different physician. The patient was monitored while under deep sedation. Anesthestetic sedation was provided intravenously by Anesthesiology: 190mg  of Propofol. Image quality was adequate. The patient's vital signs; including heart rate, blood pressure, and oxygen saturation; remained stable throughout the procedure. The patient developed no complications during the procedure.  IMPRESSIONS   1. Left ventricular ejection fraction, by estimation, is 60 to 65%. The left ventricle has normal function. The left ventricle has no regional wall motion abnormalities. 2. Right ventricular systolic function is normal. The right ventricular size is normal. 3. No left atrial/left atrial appendage thrombus was detected. The LAA emptying velocity was 86 cm/s. 4. The mitral valve is normal in structure. Mild mitral valve regurgitation. No evidence of mitral stenosis. 5. The aortic valve is tricuspid. Aortic valve regurgitation is not visualized. No aortic stenosis is present. 6. Aortic dilatation noted. There is mild dilatation of the ascending aorta, measuring 38 mm. 7. The inferior vena cava is normal in size with greater than 50% respiratory variability, suggesting right atrial pressure of 3  mmHg.  Conclusion(s)/Recommendation(s): Normal biventricular function without evidence of hemodynamically significant valvular heart disease.  FINDINGS Left Ventricle: Left ventricular ejection fraction, by estimation, is 60 to 65%. The left ventricle has normal function. The left ventricle has no regional wall motion abnormalities. The left ventricular internal cavity size was normal in size. There is no left ventricular hypertrophy.  Right Ventricle: The right ventricular size is normal. No increase in right ventricular wall thickness. Right ventricular systolic function is normal.  Left Atrium: Left atrial size was normal in size. No left atrial/left atrial appendage thrombus was detected. The LAA emptying velocity was 86 cm/s.  Right Atrium: Right atrial size was normal in size.  Pericardium: There is no evidence of pericardial effusion.  Mitral Valve: The mitral valve is normal in structure. Mild mitral valve regurgitation. No evidence of mitral valve stenosis.  Tricuspid Valve: The tricuspid valve is normal in structure. Tricuspid valve regurgitation is trivial. No evidence of tricuspid stenosis.  Aortic Valve: The aortic valve is tricuspid. Aortic valve regurgitation is not visualized. No aortic stenosis is present.  Pulmonic Valve: The pulmonic valve was normal in structure. Pulmonic valve regurgitation is not visualized. No evidence of pulmonic stenosis.  Aorta: Aortic dilatation noted. There is mild dilatation of the ascending aorta, measuring 38 mm.  Venous: The inferior vena cava is normal in size with greater than 50% respiratory variability, suggesting right atrial pressure of 3 mmHg.  IAS/Shunts: No atrial level shunt detected by color flow Doppler.  Chilton Si MD Electronically signed by Chilton Si MD Signature Date/Time: 03/25/2020/3:59:37 PM    Final         ______________________________________________________________________________________________      Risk Assessment/Calculations:    CHA2DS2-VASc Score = 3   This indicates a 3.2% annual risk of stroke. The patient's score is based upon: CHF History: 0 HTN History: 1 Diabetes History: 1 Stroke History: 0 Vascular Disease History: 0 Age Score: 1 Gender Score: 0            Physical Exam:   VS:  BP 130/78 (BP Location: Left Arm, Patient Position: Sitting, Cuff Size: Normal)   Pulse 65   Ht 5\' 7"  (1.702 m)   Wt  221 lb (100.2 kg)   SpO2 97%   BMI 34.61 kg/m    Wt Readings from Last 3 Encounters:  07/26/23 221 lb (100.2 kg)  07/04/23 216 lb 8 oz (98.2 kg)  07/04/23 219 lb (99.3 kg)    GEN: Well nourished, well developed in no acute distress NECK: No JVD; No carotid bruits CARDIAC: RRR, no murmurs, rubs, gallops RESPIRATORY:  Clear to auscultation without rales, wheezing or rhonchi  ABDOMEN: Soft, non-tender, non-distended EXTREMITIES:  No edema; No deformity   ASSESSMENT AND PLAN: .    CAD: Recent cardiac catheterization showed mild nonobstructive disease.  No further workup is recommended at this time.  Continue medical management  PAF: On Eliquis and Multaq.  Prior history of ablation x 2  Hypertension: Blood pressure stable  Hyperlipidemia: On rosuvastatin         Dispo: Follow-up with Dr. Theodis Fiscal in 67-month  Signed, Britney Newstrom, [place]

## 2023-07-26 NOTE — Patient Instructions (Signed)
 Medication Instructions:  DISCONTINUE ASPIRIN *If you need a refill on your cardiac medications before your next appointment, please call your pharmacy*  Lab Work: NO LABS If you have labs (blood work) drawn today and your tests are completely normal, you will receive your results only by: MyChart Message (if you have MyChart) OR A paper copy in the mail If you have any lab test that is abnormal or we need to change your treatment, we will call you to review the results.  Testing/Procedures: NO TESTING  Follow-Up: At [facility], you and your health needs are our priority.  As part of our continuing mission to provide you with exceptional heart care, our providers are all part of one team.  This team includes your primary Cardiologist (physician) and Advanced Practice Providers or APPs (Physician Assistants and Nurse Practitioners) who all work together to provide you with the care you need, when you need it.  Your next appointment:   9 month(s)  Provider:   Maudine Sos, MD   Other Instructions   1st Floor: - Lobby - Registration  - Pharmacy  - Lab - Cafe  2nd Floor: - PV Lab - Diagnostic Testing (echo, CT, nuclear med)  3rd Floor: - Vacant  4th Floor: - TCTS (cardiothoracic surgery) - AFib Clinic - Structural Heart Clinic - Vascular Surgery  - Vascular Ultrasound  5th Floor: - HeartCare Cardiology (general and EP) - Clinical Pharmacy for coumadin, hypertension, lipid, weight-loss medications, and med management appointments    Valet parking services will be available as well.

## 2023-08-02 NOTE — Progress Notes (Signed)
 Order(s) created erroneously. Erroneous order ID: [id number]  Order canceled by: CHART CORRECTION ANALYST NINE, IDENTITY  Order cancel date/time: 08/02/2023 7:15 AM

## 2023-08-14 ENCOUNTER — Encounter (HOSPITAL_BASED_OUTPATIENT_CLINIC_OR_DEPARTMENT_OTHER): Payer: Self-pay | Admitting: *Deleted

## 2023-08-28 DIAGNOSIS — I130 Hypertensive heart and chronic kidney disease with heart failure and stage 1 through stage 4 chronic kidney disease, or unspecified chronic kidney disease: Secondary | ICD-10-CM | POA: Diagnosis not present

## 2023-08-28 DIAGNOSIS — N182 Chronic kidney disease, stage 2 (mild): Secondary | ICD-10-CM | POA: Diagnosis not present

## 2023-08-28 DIAGNOSIS — G4733 Obstructive sleep apnea (adult) (pediatric): Secondary | ICD-10-CM | POA: Diagnosis not present

## 2023-08-28 DIAGNOSIS — I2510 Atherosclerotic heart disease of native coronary artery without angina pectoris: Secondary | ICD-10-CM | POA: Diagnosis not present

## 2023-08-28 DIAGNOSIS — Z9889 Other specified postprocedural states: Secondary | ICD-10-CM | POA: Diagnosis not present

## 2023-08-28 DIAGNOSIS — I5042 Chronic combined systolic (congestive) and diastolic (congestive) heart failure: Secondary | ICD-10-CM | POA: Diagnosis not present

## 2023-08-28 DIAGNOSIS — E1151 Type 2 diabetes mellitus with diabetic peripheral angiopathy without gangrene: Secondary | ICD-10-CM | POA: Diagnosis not present

## 2023-08-28 DIAGNOSIS — I739 Peripheral vascular disease, unspecified: Secondary | ICD-10-CM | POA: Diagnosis not present

## 2023-10-18 DIAGNOSIS — H0102A Squamous blepharitis right eye, upper and lower eyelids: Secondary | ICD-10-CM | POA: Diagnosis not present

## 2023-10-18 DIAGNOSIS — H0288B Meibomian gland dysfunction left eye, upper and lower eyelids: Secondary | ICD-10-CM | POA: Diagnosis not present

## 2023-10-18 DIAGNOSIS — E119 Type 2 diabetes mellitus without complications: Secondary | ICD-10-CM | POA: Diagnosis not present

## 2023-10-18 DIAGNOSIS — H43812 Vitreous degeneration, left eye: Secondary | ICD-10-CM | POA: Diagnosis not present

## 2023-10-18 DIAGNOSIS — H0102B Squamous blepharitis left eye, upper and lower eyelids: Secondary | ICD-10-CM | POA: Diagnosis not present

## 2023-10-18 DIAGNOSIS — H0288A Meibomian gland dysfunction right eye, upper and lower eyelids: Secondary | ICD-10-CM | POA: Diagnosis not present

## 2023-10-18 DIAGNOSIS — H2513 Age-related nuclear cataract, bilateral: Secondary | ICD-10-CM | POA: Diagnosis not present

## 2023-10-31 DIAGNOSIS — H2512 Age-related nuclear cataract, left eye: Secondary | ICD-10-CM | POA: Diagnosis not present

## 2023-11-01 ENCOUNTER — Encounter (HOSPITAL_BASED_OUTPATIENT_CLINIC_OR_DEPARTMENT_OTHER): Payer: Self-pay | Admitting: *Deleted

## 2023-11-05 ENCOUNTER — Other Ambulatory Visit (HOSPITAL_BASED_OUTPATIENT_CLINIC_OR_DEPARTMENT_OTHER): Payer: Self-pay | Admitting: Cardiovascular Disease

## 2023-11-09 DIAGNOSIS — H2511 Age-related nuclear cataract, right eye: Secondary | ICD-10-CM | POA: Diagnosis not present

## 2023-11-12 NOTE — Progress Notes (Unsigned)
 Ellouise Console, PA-C [address] [place], [place]  [address] Phone: [phone]   Primary Care Physician: Yolande Toribio MATSU, MD  Primary Gastroenterologist:  Ellouise Console, PA-C / Dr. Gordy Starch   Chief Complaint: Discussed repeat colonoscopy, history of colon polyps       HPI:   [name] [name] is a 73 y.o. male  08/2020 colonoscopy: 8 small tubular adenoma polyps removed (3 mm to 6 mm).  No dysplasia.  Pandiverticulosis.  Small internal hemorrhoids.  Good prep.  3-year repeat (due 08/2023).  PMH: Hypertension, hyperlipidemia, atrial fibrillation s/p ablation x 2 cardioversion 05/2020 on Xarelo, chronic diastolic CHF, TIA 2015, diabetes mellitus type 2, CKD stage III, sleep apnea uses a mouth guard, prostate cancer, GERD, diverticulitis and colon polyps.  Had post polypectomy bleed in 2023 after benign gastric polyp was removed.  Hx of IDA.  01/17/2022 EGD with hyperplastic gastric polyp removed and prophylactic clip placed.    Patient called the office 01/23/2022 after being back on Xarelto  for 4 days with soft black stools, nausea.  Patient was advised to stop Xarelto , continue PPI and presented to the ER.In the ER patient had hemoglobin of 8.1 compared to 11.62 weeks prior.    01/24/2022 EGD Dr. Legrand in the hospital for melena and anemia nonbleeding linear gastric ulcer prepyloric region at the polypectomy site in the 8 to 10 mm largest diameter, injected with epi, 3 clips placed.   06/2023 lab: Normal CBC, Hgb 13.7.  Current Outpatient Medications  Medication Sig Dispense Refill   acetaminophen  (TYLENOL ) 500 MG tablet Take 1,000 mg by mouth every 6 (six) hours as needed for moderate pain or headache.     albuterol  (VENTOLIN  HFA) 108 (90 Base) MCG/ACT inhaler Inhale 2 puffs into the lungs every 6 (six) hours as needed for wheezing or shortness of breath. 8 g 2   apixaban  (ELIQUIS ) 5 MG TABS tablet Take 1 tablet by mouth twice daily 180 tablet 1   carvedilol  (COREG ) 12.5  MG tablet TAKE 3 TABLETS TWICE A DAY 540 tablet 3   dronedarone  (MULTAQ ) 400 MG tablet Take 1 tablet (400 mg total) by mouth 2 (two) times daily with a meal. 180 tablet 3   Dulaglutide  (TRULICITY ) 1.5 MG/0.5ML SOPN Inject 1.5 mg into the skin once a week. 2 mL 11   DULoxetine  (CYMBALTA ) 60 MG capsule Take 60 mg by mouth daily.     esomeprazole (NEXIUM) 40 MG capsule Take 40 mg by mouth daily.     folic acid  (FOLVITE ) 1 MG tablet Take 1 mg by mouth daily.     Glucosamine HCl 1500 MG TABS Take 1,500 mg by mouth 2 (two) times daily.     iron polysaccharides (NIFEREX) 150 MG capsule Take 150 mg by mouth daily.     JARDIANCE  25 MG TABS tablet Take 25 mg by mouth daily.     levothyroxine  (SYNTHROID ) 175 MCG tablet Take 175 mcg by mouth daily.     losartan  (COZAAR ) 50 MG tablet TAKE 1 TABLET EVERY EVENING 90 tablet 1   metFORMIN  (GLUCOPHAGE -XR) 500 MG 24 hr tablet Take 1,000 mg by mouth 2 (two) times daily.     Multiple Vitamin (MULTIVITAMIN WITH MINERALS) TABS tablet Take 1 tablet by mouth daily. One-A-Day for Men     Multiple Vitamins-Minerals (PRESERVISION AREDS 2) CAPS Take 1 capsule by mouth 2 (two) times daily.     nitroGLYCERIN  (NITROSTAT ) 0.4 MG SL tablet Place 0.4 mg under the tongue  every 5 (five) minutes as needed for chest pain.     omeprazole (PRILOSEC) 40 MG capsule Take 40 mg by mouth daily.     oxyCODONE -acetaminophen  (PERCOCET/ROXICET) 5-325 MG tablet Take 1-2 tablets by mouth every 6 (six) hours as needed for moderate pain (pain score 4-6).     Probiotic Product (PROBIOTIC PO) Take 1 capsule by mouth at bedtime.     rosuvastatin  (CRESTOR ) 10 MG tablet Take 1 tablet (10 mg total) by mouth daily. 90 tablet 1   vitamin B-12 (CYANOCOBALAMIN ) 1000 MCG tablet Take 1,000 mcg by mouth daily.     Vitamin D , Ergocalciferol , (DRISDOL) 1.25 MG (50000 UNIT) CAPS capsule Take 50,000 Units by mouth every 30 (thirty) days.     No current facility-administered medications for this visit.     Allergies as of 11/14/2023 - Review Complete 07/26/2023  Allergen Reaction Noted   Bystolic [nebivolol hcl] Swelling and Other (See Comments) 08/25/2011   Calcium  channel blockers Swelling and Other (See Comments) 08/25/2011   Feraheme [ferumoxytol ] Shortness Of Breath, Nausea And Vomiting, and Other (See Comments) 02/08/2022   Phenergan [promethazine hcl] Itching and Other (See Comments) 08/25/2011   Beta adrenergic blockers Swelling and Other (See Comments) 05/13/2020   Cardizem  [diltiazem ] Other (See Comments) 03/23/2020   Norvasc [amlodipine] Other (See Comments) 05/13/2020   Aspirin  Other (See Comments) 08/25/2011   Nsaids Other (See Comments) 08/25/2011   Prednisone  Other (See Comments) 08/25/2011    Past Medical History:  Diagnosis Date   Arthritis    OA   Atrial flutter (HCC)    a. s/p RFCA 8/13   Cancer (HCC) 2010   prostate   Chronic combined systolic and diastolic CHF (congestive heart failure) (HCC)    a. LVEF previously 35% felt to be due tachycardia;  b. 02/2013 Echo: EF 50-55%, no rwma, mildly dil LA/RA, mild to mod MR. C 11/2014 echo - ef 60-65%, unable to determine DD   CKD (chronic kidney disease), stage III (HCC)    hx of a/c renal failure during episode of diverticulitis 9/13 in [place]   Diabetes mellitus (HCC)    TYPE 2    Diverticulitis    Diverticulosis    Dyslipidemia    Dysrhythmia    Erectile dysfunction    Gastric ulcer    s/p prior surgery   GERD (gastroesophageal reflux disease)    Headache    History of colonic diverticulitis    History of prostate cancer    Hypertension    Hypothyroid    Kidney stones    Microcytic anemia    Obesities, morbid (HCC)    Obstructive sleep apnea    noncompliant with CPAP   Osteoporosis    PAF (paroxysmal atrial fibrillation) (HCC)    a. failed DCCV and multiple anti-arrhythmic drugs (multaq /flecainide );   b. s/p  PVI isolation with ablation of AFib 8/13; c. repeat PVI 01/2013;  d. 02/2013 repeat  DCCV->Amio load/xarelto .   Peptic ulcer disease 1994   Renal calculi    Sleep apnea 2015   has not had sleep apnea since heart intervention   Thrombocytopenia (HCC) 1992   idopathic-treated with danazol   Tubular adenoma of colon     Past Surgical History:  Procedure Laterality Date   ATRIAL FIBRILLATION ABLATION  12/06/11; 02/05/2013   Afib and atrial flutter ablation by Dr Kelsie; repeat PVI by Dr Kelsie 02/05/2013   ATRIAL FIBRILLATION ABLATION N/A 12/06/2011   Procedure: ATRIAL FIBRILLATION ABLATION;  Surgeon: Lynwood Kelsie, MD;  Location:  [facility] CATH LAB;  Service: Cardiovascular;  Laterality: N/A;   ATRIAL FIBRILLATION ABLATION N/A 02/05/2013   Procedure: ATRIAL FIBRILLATION ABLATION;  Surgeon: Lynwood JONETTA Rakers, MD;  Location: [facility] CATH LAB;  Service: Cardiovascular;  Laterality: N/A;   benign stomach tumor removal  2000   CARDIOVERSION  08/25/2011   Procedure: CARDIOVERSION;  Surgeon: Candyce CANDIE Reek, MD;  Location: [facility] ENDOSCOPY;  Service: Cardiovascular;  Laterality: N/A;   CARDIOVERSION N/A 03/05/2013   Procedure: CARDIOVERSION;  Surgeon: Victory LELON Claudene DOUGLAS, MD;  Location: [facility] OR;  Service: Cardiovascular;  Laterality: N/A;  BEDSIDE    CARDIOVERSION N/A 03/25/2020   Procedure: CARDIOVERSION;  Surgeon: Raford Riggs, MD;  Location: [facility] ENDOSCOPY;  Service: Cardiovascular;  Laterality: N/A;   CARDIOVERSION N/A 06/02/2020   Procedure: CARDIOVERSION;  Surgeon: Mona Vinie BROCKS, MD;  Location: [facility] ENDOSCOPY;  Service: Cardiovascular;  Laterality: N/A;   CARPAL TUNNEL WITH CUBITAL TUNNEL Right 02/25/2020   Procedure: RIGHT CARPAL TUNNEL RELEASE, CUBITAL TUNNEL RELEASE IN SITU, RIGHT WRIST PROXIMAL ROW CARPECTOMY AND PROXIMAL CAPITATE HEAD REPLACEMENT/HEMIARTHROPLASTY WITH POSTERIOR INTEROSSOUS NERVE NEURECTOMY AND REPAIR;  Surgeon: Camella Fallow, MD;  Location: [facility] OR;  Service: Orthopedics;  Laterality: Right;  2.5 hrs Block with IV Sedation   convergent afib abaltion [facility] 02/26/15   02/26/2015   [facility] by Dr. Craig and Dr. Ranelle   ELECTROPHYSIOLOGIC STUDY N/A 11/24/2014   Procedure: Cardioversion;  Surgeon: Will Gladis Norton, MD;  Location: [facility] INVASIVE CV LAB;  Service: Cardiovascular;  Laterality: N/A;   ESOPHAGOGASTRODUODENOSCOPY N/A 04/09/2013   Procedure: ESOPHAGOGASTRODUODENOSCOPY (EGD) with possible Balloon Dilation;  Surgeon: Gladis MARLA Louder, MD;  Location: [facility] ENDOSCOPY;  Service: Endoscopy;  Laterality: N/A;   ESOPHAGOGASTRODUODENOSCOPY (EGD) WITH PROPOFOL  N/A 01/24/2022   Procedure: ESOPHAGOGASTRODUODENOSCOPY (EGD) WITH PROPOFOL ;  Surgeon: Legrand Victory LITTIE DOUGLAS, MD;  Location: [facility] ENDOSCOPY;  Service: Gastroenterology;  Laterality: N/A;   HEMOSTASIS CLIP PLACEMENT  01/24/2022   Procedure: HEMOSTASIS CLIP PLACEMENT;  Surgeon: Legrand Victory LITTIE DOUGLAS, MD;  Location: [facility] ENDOSCOPY;  Service: Gastroenterology;;   kidney stone removal     multiple   KNEE ARTHROSCOPY Bilateral 1979   LAPAROSCOPIC RETROPUBIC PROSTATECTOMY  02/2007   hx prostate cancer   LEFT HEART CATH AND CORONARY ANGIOGRAPHY N/A 07/05/2023   Procedure: LEFT HEART CATH AND CORONARY ANGIOGRAPHY;  Surgeon: Swazil[place], Peter M, MD;  Location: [facility] INVASIVE CV LAB;  Service: Cardiovascular;  Laterality: N/A;   ORIF ANKLE FRACTURE Right 10/2015   ORIF ANKLE FRACTURE Right 11/03/2015   Procedure: ORIF RIGHT LISFRANK FOOT;  Surgeon: Jerona LULLA Sage, MD;  Location: [facility] OR;  Service: Orthopedics;  Laterality: Right;   SUBMUCOSAL INJECTION  01/24/2022   Procedure: SUBMUCOSAL INJECTION;  Surgeon: Legrand Victory LITTIE DOUGLAS, MD;  Location: [facility] ENDOSCOPY;  Service: Gastroenterology;;   TEE WITHOUT CARDIOVERSION  08/25/2011   Procedure: TRANSESOPHAGEAL ECHOCARDIOGRAM (TEE);  Surgeon: Candyce CANDIE Reek, MD;  Location: [facility] ENDOSCOPY;  Service: Cardiovascular;  Laterality: N/A;   TEE WITHOUT CARDIOVERSION  11/09/2011   Procedure: TRANSESOPHAGEAL ECHOCARDIOGRAM (TEE);  Surgeon: Candyce CANDIE Reek, MD;  Location: [facility] ENDOSCOPY;  Service:  Cardiovascular;  Laterality: N/A;  h/p in file drawer   TEE WITHOUT CARDIOVERSION N/A 02/05/2013   Procedure: TRANSESOPHAGEAL ECHOCARDIOGRAM (TEE);  Surgeon: Oneil Parchment, MD;  Location: [facility] ENDOSCOPY;  Service: Cardiovascular;  Laterality: N/A;   TEE WITHOUT CARDIOVERSION N/A 03/25/2020   Procedure: TRANSESOPHAGEAL ECHOCARDIOGRAM (TEE);  Surgeon: Raford Riggs, MD;  Location: [facility] ENDOSCOPY;  Service: Cardiovascular;  Laterality: N/A;   TOTAL THYROIDECTOMY  2009  benign nodules removed   UPPER GASTROINTESTINAL ENDOSCOPY     WRIST SURGERY Right 1977   with placement of lunate prosthesis    Review of Systems:    All systems reviewed and negative except where noted in HPI.    Physical Exam:  There were no vitals taken for this visit. No LMP for male patient.  General: Well-nourished, well-developed in no acute distress.  Lungs: Clear to auscultation bilaterally. Non-labored. Heart: Regular rate and rhythm, no murmurs rubs or gallops.  Abdomen: Bowel sounds are normal; Abdomen is Soft; No hepatosplenomegaly, masses or hernias;  No Abdominal Tenderness; No guarding or rebound tenderness. Neuro: Alert and oriented x 3.  Grossly intact.  Psych: Alert and cooperative, normal mood and affect.   Imaging Studies: No results found.  Labs: CBC    Component Value Date/Time   WBC 6.7 07/04/2023 1613   RBC 4.55 07/04/2023 1613   HGB 13.7 07/04/2023 1613   HGB 12.2 (L) 03/18/2022 1020   HCT 41.3 07/04/2023 1613   HCT 39.5 03/18/2022 1020   PLT 197 07/04/2023 1613   PLT 206 03/18/2022 1020   MCV 90.8 07/04/2023 1613   MCV 87 03/18/2022 1020   MCH 30.1 07/04/2023 1613   MCHC 33.2 07/04/2023 1613   RDW 15.1 07/04/2023 1613   RDW 17.8 (H) 03/18/2022 1020   LYMPHSABS 1.7 07/04/2023 1613   LYMPHSABS 1.2 03/18/2022 1020   MONOABS 0.7 07/04/2023 1613   EOSABS 0.3 07/04/2023 1613   EOSABS 0.2 03/18/2022 1020   BASOSABS 0.1 07/04/2023 1613   BASOSABS 0.0 03/18/2022 1020    CMP      Component Value Date/Time   NA 143 07/05/2023 2027   NA 141 03/18/2022 1020   K 3.9 07/05/2023 2027   CL 110 07/05/2023 2027   CO2 20 (L) 07/05/2023 2027   GLUCOSE 98 07/05/2023 2027   BUN 18 07/05/2023 2027   BUN 17 03/18/2022 1020   CREATININE 1.21 07/05/2023 2027   CALCIUM  8.7 (L) 07/05/2023 2027   PROT 5.7 (L) 07/05/2023 2027   PROT 6.2 03/18/2022 1020   ALBUMIN  3.2 (L) 07/05/2023 2027   ALBUMIN  4.1 03/18/2022 1020   AST 18 07/05/2023 2027   ALT 20 07/05/2023 2027   ALKPHOS 58 07/05/2023 2027   BILITOT 0.9 07/05/2023 2027   BILITOT 0.4 03/18/2022 1020   GFRNONAA >60 07/05/2023 2027   GFRAA 68 05/02/2017 0949       Assessment and Plan:   [name] [name] is a 73 y.o. y/o male ***    Ellouise Console, PA-C  Follow up ***

## 2023-11-14 ENCOUNTER — Other Ambulatory Visit (INDEPENDENT_AMBULATORY_CARE_PROVIDER_SITE_OTHER)

## 2023-11-14 ENCOUNTER — Telehealth: Payer: Self-pay

## 2023-11-14 ENCOUNTER — Encounter: Payer: Self-pay | Admitting: Physician Assistant

## 2023-11-14 ENCOUNTER — Ambulatory Visit (INDEPENDENT_AMBULATORY_CARE_PROVIDER_SITE_OTHER): Admitting: Physician Assistant

## 2023-11-14 VITALS — BP 110/70 | HR 63 | Ht 67.0 in | Wt 219.0 lb

## 2023-11-14 DIAGNOSIS — Z860101 Personal history of adenomatous and serrated colon polyps: Secondary | ICD-10-CM

## 2023-11-14 DIAGNOSIS — D509 Iron deficiency anemia, unspecified: Secondary | ICD-10-CM

## 2023-11-14 DIAGNOSIS — Z862 Personal history of diseases of the blood and blood-forming organs and certain disorders involving the immune mechanism: Secondary | ICD-10-CM

## 2023-11-14 DIAGNOSIS — Z7901 Long term (current) use of anticoagulants: Secondary | ICD-10-CM | POA: Diagnosis not present

## 2023-11-14 DIAGNOSIS — Z860100 Personal history of colon polyps, unspecified: Secondary | ICD-10-CM

## 2023-11-14 DIAGNOSIS — I4891 Unspecified atrial fibrillation: Secondary | ICD-10-CM

## 2023-11-14 LAB — CBC
HCT: 41.7 % (ref 39.0–52.0)
Hemoglobin: 13.7 g/dL (ref 13.0–17.0)
MCHC: 32.9 g/dL (ref 30.0–36.0)
MCV: 89.7 fl (ref 78.0–100.0)
Platelets: 192 K/uL (ref 150.0–400.0)
RBC: 4.65 Mil/uL (ref 4.22–5.81)
RDW: 14.9 % (ref 11.5–15.5)
WBC: 6.9 K/uL (ref 4.0–10.5)

## 2023-11-14 MED ORDER — NA SULFATE-K SULFATE-MG SULF 17.5-3.13-1.6 GM/177ML PO SOLN: 1.0000 | Freq: Once | ORAL | 0 refills | Status: AC

## 2023-11-14 NOTE — Telephone Encounter (Signed)
 [facility] Pre-operative Risk Assessment     Request for surgical clearance:     Endoscopy Procedure  What type of surgery is being performed?     Colonoscopy   When is this surgery scheduled?     01/25/24  What type of clearance is required ?   Pharmacy  Are there any medications that need to be held prior to surgery and how long? Eliquis  2 days  Practice name and name of physician performing surgery?      [facility]  What is your office phone and fax number?      Phone- [phone]  Fax- [phone]  Anesthesia type (None, local, MAC, general) ?       MAC   Please route your response to Alethea Blocker, CMA

## 2023-11-14 NOTE — Patient Instructions (Signed)
 Your provider has requested that you go to the basement level for lab work before leaving today. Press B on the elevator. The lab is located at the first door on the left as you exit the elevator.  You have been scheduled for a Colonoscopy. Please follow written instructions given to you at your visit today.   If you use inhalers (even only as needed), please bring them with you on the day of your procedure.  DO NOT TAKE 7 DAYS PRIOR TO TEST- Trulicity  (dulaglutide ) Ozempic , Wegovy  (semaglutide ) Mounjaro (tirzepatide) Bydureon Bcise (exanatide extended release)  DO NOT TAKE 1 DAY PRIOR TO YOUR TEST Rybelsus  (semaglutide ) Adlyxin (lixisenatide) Victoza (liraglutide) Byetta (exanatide) ___________________________________________________________________________  Please follow up sooner if symptoms increase or worsen   Due to recent changes in healthcare laws, you may see the results of your imaging and laboratory studies on MyChart before your provider has had a chance to review them.  We understand that in some cases there may be results that are confusing or concerning to you. Not all laboratory results come back in the same time frame and the provider may be waiting for multiple results in order to interpret others.  Please give us  48 hours in order for your provider to thoroughly review all the results before contacting the office for clarification of your results.   Thank you for trusting me with your gastrointestinal care!   Ellouise Console, PA-C _______________________________________________________  If your blood pressure at your visit was 140/90 or greater, please contact your primary care physician to follow up on this.  _______________________________________________________  If you are age 39 or older, your body mass index should be between 23-30. Your Body mass index is 34.3 kg/m. If this is out of the aforementioned range listed, please consider follow up with your Primary  Care Provider.  If you are age 81 or younger, your body mass index should be between 19-25. Your Body mass index is 34.3 kg/m. If this is out of the aformentioned range listed, please consider follow up with your Primary Care Provider.   ________________________________________________________  The [facility] providers would like to encourage you to use MYCHART to communicate with providers for non-urgent requests or questions.  Due to long hold times on the telephone, sending your provider a message by [facility] may be a faster and more efficient way to get a response.  Please allow 48 business hours for a response.  Please remember that this is for non-urgent requests.  _______________________________________________________

## 2023-11-15 ENCOUNTER — Other Ambulatory Visit (HOSPITAL_BASED_OUTPATIENT_CLINIC_OR_DEPARTMENT_OTHER): Payer: Self-pay | Admitting: Cardiovascular Disease

## 2023-11-15 ENCOUNTER — Ambulatory Visit: Payer: Self-pay | Admitting: Physician Assistant

## 2023-11-15 DIAGNOSIS — I480 Paroxysmal atrial fibrillation: Secondary | ICD-10-CM

## 2023-11-15 LAB — IRON,TIBC AND FERRITIN PANEL
%SAT: 21 % (ref 20–48)
Ferritin: 32 ng/mL (ref 24–380)
Iron: 63 ug/dL (ref 50–180)
TIBC: 307 ug/dL (ref 250–425)

## 2023-11-15 NOTE — Telephone Encounter (Signed)
 Prescription refill request for Eliquis  received. Indication: AF Last office visit:07/26/23  H Meng PA Scr: 1.21 on 07/05/23  Epic Age: 73 Weight: 100.2kg  Based on above findings Eliquis  5mg  twice daily is the appropriate dose.  Refill approved.

## 2023-11-16 DIAGNOSIS — H2511 Age-related nuclear cataract, right eye: Secondary | ICD-10-CM | POA: Diagnosis not present

## 2023-11-16 NOTE — Telephone Encounter (Signed)
 Patient with diagnosis of afib on Eliquis  for anticoagulation.    Procedure:  Colonoscopy  Date of procedure: 01/25/2024   CHA2DS2-VASc Score = 3   This indicates a 3.2% annual risk of stroke. The patient's score is based upon: CHF History: 0 HTN History: 1 Diabetes History: 1 Stroke History: 0 Vascular Disease History: 0 Age Score: 1 Gender Score: 0  CrCl 61 mL/min Platelet count 192    Per office protocol, patient can hold Eliquis  for 2 days prior to procedure.   Patient will not need bridging with Lovenox (enoxaparin) around procedure.  **This guidance is not considered finalized until pre-operative APP has relayed final recommendations.**

## 2023-11-17 NOTE — Telephone Encounter (Signed)
Patient made aware and sent mychart message

## 2023-11-17 NOTE — Telephone Encounter (Signed)
     Primary Cardiologist: Annabella Scarce, MD  Clinical pharmacist have provided the following recommendations for ,[name] [name] :  Patient with diagnosis of afib on Eliquis  for anticoagulation.     Procedure:  Colonoscopy  Date of procedure: 01/25/2024     CHA2DS2-VASc Score = 3   This indicates a 3.2% annual risk of stroke. The patient's score is based upon: CHF History: 0 HTN History: 1 Diabetes History: 1 Stroke History: 0 Vascular Disease History: 0 Age Score: 1 Gender Score: 0   CrCl 61 mL/min Platelet count 192       Per office protocol, patient can hold Eliquis  for 2 days prior to procedure.   Patient will not need bridging with Lovenox (enoxaparin) around procedure.    I will route this recommendation to the requesting party via Epic fax function and remove from pre-op pool.  Please call with questions.  [name] [name]. [name] Bartko NP-C     11/17/2023, 9:41 AM [facility] [address] Office [phone] Fax [phone]

## 2023-11-19 DIAGNOSIS — H43391 Other vitreous opacities, right eye: Secondary | ICD-10-CM | POA: Diagnosis not present

## 2023-11-21 DIAGNOSIS — N182 Chronic kidney disease, stage 2 (mild): Secondary | ICD-10-CM | POA: Diagnosis not present

## 2023-11-21 DIAGNOSIS — I130 Hypertensive heart and chronic kidney disease with heart failure and stage 1 through stage 4 chronic kidney disease, or unspecified chronic kidney disease: Secondary | ICD-10-CM | POA: Diagnosis not present

## 2023-11-21 DIAGNOSIS — I2510 Atherosclerotic heart disease of native coronary artery without angina pectoris: Secondary | ICD-10-CM | POA: Diagnosis not present

## 2023-11-21 DIAGNOSIS — E1129 Type 2 diabetes mellitus with other diabetic kidney complication: Secondary | ICD-10-CM | POA: Diagnosis not present

## 2024-01-08 DIAGNOSIS — L0292 Furuncle, unspecified: Secondary | ICD-10-CM | POA: Diagnosis not present

## 2024-01-08 DIAGNOSIS — R600 Localized edema: Secondary | ICD-10-CM | POA: Diagnosis not present

## 2024-01-08 DIAGNOSIS — B999 Unspecified infectious disease: Secondary | ICD-10-CM | POA: Diagnosis not present

## 2024-01-14 DIAGNOSIS — Z23 Encounter for immunization: Secondary | ICD-10-CM | POA: Diagnosis not present

## 2024-01-17 ENCOUNTER — Telehealth (HOSPITAL_BASED_OUTPATIENT_CLINIC_OR_DEPARTMENT_OTHER): Payer: Self-pay | Admitting: *Deleted

## 2024-01-17 NOTE — Telephone Encounter (Signed)
 Received shipment of Multaq  from patient assistance, mychart sent to patient to inform

## 2024-01-22 ENCOUNTER — Telehealth: Payer: Self-pay | Admitting: Internal Medicine

## 2024-01-22 NOTE — Telephone Encounter (Signed)
 Pts wife wanted to know if ok for pt to continue taking his probiotic prior to colon. Discussed with her that was fine to continue taking.

## 2024-01-22 NOTE — Telephone Encounter (Signed)
 Inbound call from patients wife stating they would like to speak to nurse in regards to medication patient needs to withhold for upcoming procedure on 01/25/24 Requesting a call back  Please advise  Thank you

## 2024-01-25 ENCOUNTER — Ambulatory Visit (AMBULATORY_SURGERY_CENTER): Admitting: Internal Medicine

## 2024-01-25 ENCOUNTER — Encounter: Payer: Self-pay | Admitting: Internal Medicine

## 2024-01-25 VITALS — BP 166/85 | HR 57 | Temp 98.1°F | Resp 14 | Ht 67.0 in | Wt 219.0 lb

## 2024-01-25 DIAGNOSIS — Z860100 Personal history of colon polyps, unspecified: Secondary | ICD-10-CM | POA: Diagnosis not present

## 2024-01-25 DIAGNOSIS — Z860101 Personal history of adenomatous and serrated colon polyps: Secondary | ICD-10-CM

## 2024-01-25 DIAGNOSIS — K5730 Diverticulosis of large intestine without perforation or abscess without bleeding: Secondary | ICD-10-CM | POA: Diagnosis not present

## 2024-01-25 DIAGNOSIS — E119 Type 2 diabetes mellitus without complications: Secondary | ICD-10-CM | POA: Diagnosis not present

## 2024-01-25 DIAGNOSIS — K648 Other hemorrhoids: Secondary | ICD-10-CM | POA: Diagnosis not present

## 2024-01-25 DIAGNOSIS — D120 Benign neoplasm of cecum: Secondary | ICD-10-CM | POA: Diagnosis not present

## 2024-01-25 DIAGNOSIS — D123 Benign neoplasm of transverse colon: Secondary | ICD-10-CM

## 2024-01-25 DIAGNOSIS — Z1211 Encounter for screening for malignant neoplasm of colon: Secondary | ICD-10-CM | POA: Diagnosis not present

## 2024-01-25 DIAGNOSIS — D122 Benign neoplasm of ascending colon: Secondary | ICD-10-CM | POA: Diagnosis not present

## 2024-01-25 DIAGNOSIS — G4733 Obstructive sleep apnea (adult) (pediatric): Secondary | ICD-10-CM | POA: Diagnosis not present

## 2024-01-25 MED ORDER — SODIUM CHLORIDE 0.9 % IV SOLN: 500.0000 mL | Freq: Once | INTRAVENOUS | Status: DC

## 2024-01-25 NOTE — Op Note (Signed)
 [facility] Patient Name: [name] [name] Procedure Date: 01/25/2024 7:25 AM MRN: [id number] Endoscopist: Gordy CHRISTELLA Starch , MD, [id number] Age: 73 Referring MD:  Date of Birth: 1951-01-03 Gender: Male Account #: [id number] Procedure:                Colonoscopy Indications:              High risk colon cancer surveillance: Personal                            history of multiple adenomas, Last colonoscopy: May                            2022 (TA x 8) Medicines:                Monitored Anesthesia Care Procedure:                Pre-Anesthesia Assessment:                           - Prior to the procedure, a History and Physical                            was performed, and patient medications and                            allergies were reviewed. The patient's tolerance of                            previous anesthesia was also reviewed. The risks                            and benefits of the procedure and the sedation                            options and risks were discussed with the patient.                            All questions were answered, and informed consent                            was obtained. Prior Anticoagulants: The patient has                            taken Eliquis  (apixaban ), last dose was 2 days                            prior to procedure. ASA Grade Assessment: III - A                            patient with severe systemic disease. After                            reviewing the risks and benefits, the patient was  deemed in satisfactory condition to undergo the                            procedure.                           After obtaining informed consent, the colonoscope                            was passed under direct vision. Throughout the                            procedure, the patient's blood pressure, pulse, and                            oxygen saturations were monitored continuously. The                             Olympus Scope SN: [id number] was introduced through                            the anus and advanced to the cecum, identified by                            appendiceal orifice and ileocecal valve. The                            colonoscopy was performed without difficulty. The                            patient tolerated the procedure well. The quality                            of the bowel preparation was good. The ileocecal                            valve, appendiceal orifice, and rectum were                            photographed. Scope In: 8:05:00 AM Scope Out: 8:25:18 AM Scope Withdrawal Time: 0 hours 15 minutes 56 seconds  Total Procedure Duration: 0 hours 20 minutes 18 seconds  Findings:                 The digital rectal exam was normal.                           A 3 mm polyp was found in the cecum. The polyp was                            sessile. The polyp was removed with a cold snare.                            Resection and retrieval were complete.  A 3 mm polyp was found in the ascending colon. The                            polyp was sessile. The polyp was removed with a                            cold snare. Resection and retrieval were complete.                           Three sessile polyps were found in the transverse                            colon. The polyps were 3 to 4 mm in size. These                            polyps were removed with a cold snare. Resection                            and retrieval were complete.                           Multiple large-mouthed, medium-mouthed and                            small-mouthed diverticula were found in the sigmoid                            colon, descending colon and transverse colon.                           Internal hemorrhoids were found during                            retroflexion. The hemorrhoids were small. Complications:            No immediate complications. Estimated Blood  Loss:     Estimated blood loss: none. Impression:               - One 3 mm polyp in the cecum, removed with a cold                            snare. Resected and retrieved.                           - One 3 mm polyp in the ascending colon, removed                            with a cold snare. Resected and retrieved.                           - Three 3 to 4 mm polyps in the transverse colon,                            removed with  a cold snare. Resected and retrieved.                           - Moderate to severe diverticulosis in the sigmoid                            colon, in the descending colon and in the                            transverse colon.                           - Small internal hemorrhoids. Recommendation:           - Patient has a contact number available for                            emergencies. The signs and symptoms of potential                            delayed complications were discussed with the                            patient. Return to normal activities tomorrow.                            Written discharge instructions were provided to the                            patient.                           - Resume previous diet.                           - Continue present medications.                           - Await pathology results.                           - Repeat colonoscopy is recommended for                            surveillance. The colonoscopy date will be                            determined after pathology results from today's                            exam become available for review.                           - Resume Eliquis  (apixaban ) at prior dose today.                            Refer to managing physician  for further adjustment                            of therapy. Gordy CHRISTELLA Starch, MD 01/25/2024 8:30:35 AM This report has been signed electronically.

## 2024-01-25 NOTE — Progress Notes (Signed)
 Called to room to assist during endoscopic procedure.  Patient ID and intended procedure confirmed with present staff. Received instructions for my participation in the procedure from the performing physician.

## 2024-01-25 NOTE — Patient Instructions (Signed)
 Handouts provided on polyps, diverticulosis and hemorrhoids.  Resume previous diet.  Continue present medications.  Resume Eliquis  (apixaban ) at prior dose today.  Await pathology results.  Repeat colonoscopy is recommended for surveillance. The colonoscopy date will be determined after pathology results from today's exam become available for review.   YOU HAD AN ENDOSCOPIC PROCEDURE TODAY AT THE [facility]:   Refer to the procedure report that was given to you for any specific questions about what was found during the examination.  If the procedure report does not answer your questions, please call your gastroenterologist to clarify.  If you requested that your care partner not be given the details of your procedure findings, then the procedure report has been included in a sealed envelope for you to review at your convenience later.  YOU SHOULD EXPECT: Some feelings of bloating in the abdomen. Passage of more gas than usual.  Walking can help get rid of the air that was put into your GI tract during the procedure and reduce the bloating. If you had a lower endoscopy (such as a colonoscopy or flexible sigmoidoscopy) you may notice spotting of blood in your stool or on the toilet paper. If you underwent a bowel prep for your procedure, you may not have a normal bowel movement for a few days.  Please Note:  You might notice some irritation and congestion in your nose or some drainage.  This is from the oxygen used during your procedure.  There is no need for concern and it should clear up in a day or so.  SYMPTOMS TO REPORT IMMEDIATELY:  Following lower endoscopy (colonoscopy or flexible sigmoidoscopy):  Excessive amounts of blood in the stool  Significant tenderness or worsening of abdominal pains  Swelling of the abdomen that is new, acute  Fever of 100F or higher  For urgent or emergent issues, a gastroenterologist can be reached at any hour by calling [phone]. Do not  use MyChart messaging for urgent concerns.    DIET:  We do recommend a small meal at first, but then you may proceed to your regular diet.  Drink plenty of fluids but you should avoid alcoholic beverages for 24 hours.  ACTIVITY:  You should plan to take it easy for the rest of today and you should NOT DRIVE or use heavy machinery until tomorrow (because of the sedation medicines used during the test).    FOLLOW UP: Our staff will call the number listed on your records the next business day following your procedure.  We will call around 7:15- 8:00 am to check on you and address any questions or concerns that you may have regarding the information given to you following your procedure. If we do not reach you, we will leave a message.     If any biopsies were taken you will be contacted by phone or by letter within the next 1-3 weeks.  Please call us  at [phone] if you have not heard about the biopsies in 3 weeks.    SIGNATURES/CONFIDENTIALITY: You and/or your care partner have signed paperwork which will be entered into your electronic medical record.  These signatures attest to the fact that that the information above on your After Visit Summary has been reviewed and is understood.  Full responsibility of the confidentiality of this discharge information lies with you and/or your care-partner.

## 2024-01-25 NOTE — Progress Notes (Signed)
 I have reviewed the patient's medical history in detail and updated the computerized patient record.

## 2024-01-25 NOTE — Progress Notes (Signed)
 Vss nad trans to pacu

## 2024-01-25 NOTE — Progress Notes (Signed)
 GASTROENTEROLOGY PROCEDURE H&P NOTE   Primary Care Physician: Yolande Toribio MATSU, MD    Reason for Procedure:  History of multiple colon polyps  Plan:    Colonoscopy  Patient is appropriate for endoscopic procedure(s) in the ambulatory ([facility]) setting.  The nature of the procedure, as well as the risks, benefits, and alternatives were carefully and thoroughly reviewed with the patient. Ample time for discussion and questions allowed. The patient understood, was satisfied, and agreed to proceed.     HPI: [name] [name] is a 73 y.o. male who presents for surveillance colonoscopy.  Medical history as below.  Tolerated the prep.  No recent chest pain or shortness of breath.  No abdominal pain today.  Eliquis  on hold x 2 days  Past Medical History:  Diagnosis Date   Arthritis    OA   Atrial flutter (HCC)    a. s/p RFCA 8/13   Cancer (HCC) 2010   prostate   Chronic combined systolic and diastolic CHF (congestive heart failure) (HCC)    a. LVEF previously 35% felt to be due tachycardia;  b. 02/2013 Echo: EF 50-55%, no rwma, mildly dil LA/RA, mild to mod MR. C 11/2014 echo - ef 60-65%, unable to determine DD   CKD (chronic kidney disease), stage III (HCC)    hx of a/c renal failure during episode of diverticulitis 9/13 in [place], [place]   Diabetes mellitus (HCC)    TYPE 2    Diverticulitis    Diverticulosis    Dyslipidemia    Dysrhythmia    Erectile dysfunction    Gastric ulcer    s/p prior surgery   GERD (gastroesophageal reflux disease)    Headache    History of colonic diverticulitis    History of prostate cancer    Hypertension    Hypothyroid    Kidney stones    Microcytic anemia    Obesities, morbid (HCC)    Obstructive sleep apnea    noncompliant with CPAP   Osteoporosis    PAF (paroxysmal atrial fibrillation) (HCC)    a. failed DCCV and multiple anti-arrhythmic drugs (multaq /flecainide );   b. s/p  PVI isolation with ablation of AFib 8/13; c. repeat PVI 01/2013;   d. 02/2013 repeat DCCV->Amio load/xarelto .   Peptic ulcer disease 1994   Renal calculi    Sleep apnea 2015   has not had sleep apnea since heart intervention   Thrombocytopenia 1992   idopathic-treated with danazol   Tubular adenoma of colon     Past Surgical History:  Procedure Laterality Date   ATRIAL FIBRILLATION ABLATION  12/06/11; 02/05/2013   Afib and atrial flutter ablation by Dr Kelsie; repeat PVI by Dr Kelsie 02/05/2013   ATRIAL FIBRILLATION ABLATION N/A 12/06/2011   Procedure: ATRIAL FIBRILLATION ABLATION;  Surgeon: Lynwood Kelsie, MD;  Location: [facility] CATH LAB;  Service: Cardiovascular;  Laterality: N/A;   ATRIAL FIBRILLATION ABLATION N/A 02/05/2013   Procedure: ATRIAL FIBRILLATION ABLATION;  Surgeon: Lynwood JONETTA Kelsie, MD;  Location: [facility] CATH LAB;  Service: Cardiovascular;  Laterality: N/A;   benign stomach tumor removal  2000   CARDIOVERSION  08/25/2011   Procedure: CARDIOVERSION;  Surgeon: Candyce CANDIE Reek, MD;  Location: [facility] ENDOSCOPY;  Service: Cardiovascular;  Laterality: N/A;   CARDIOVERSION N/A 03/05/2013   Procedure: CARDIOVERSION;  Surgeon: Victory LELON Claudene DOUGLAS, MD;  Location: [facility] OR;  Service: Cardiovascular;  Laterality: N/A;  BEDSIDE    CARDIOVERSION N/A 03/25/2020   Procedure: CARDIOVERSION;  Surgeon: Raford Riggs, MD;  Location: [facility] ENDOSCOPY;  Service: Cardiovascular;  Laterality: N/A;   CARDIOVERSION N/A 06/02/2020   Procedure: CARDIOVERSION;  Surgeon: Mona Vinie BROCKS, MD;  Location: [facility] ENDOSCOPY;  Service: Cardiovascular;  Laterality: N/A;   CARPAL TUNNEL WITH CUBITAL TUNNEL Right 02/25/2020   Procedure: RIGHT CARPAL TUNNEL RELEASE, CUBITAL TUNNEL RELEASE IN SITU, RIGHT WRIST PROXIMAL ROW CARPECTOMY AND PROXIMAL CAPITATE HEAD REPLACEMENT/HEMIARTHROPLASTY WITH POSTERIOR INTEROSSOUS NERVE NEURECTOMY AND REPAIR;  Surgeon: Camella Fallow, MD;  Location: [facility] OR;  Service: Orthopedics;  Laterality: Right;  2.5 hrs Block with IV Sedation   convergent afib abaltion [facility]  02/26/15  02/26/2015   [facility] by Dr. Craig and Dr. Ranelle   ELECTROPHYSIOLOGIC STUDY N/A 11/24/2014   Procedure: Cardioversion;  Surgeon: Will Gladis Norton, MD;  Location: [facility] INVASIVE CV LAB;  Service: Cardiovascular;  Laterality: N/A;   ESOPHAGOGASTRODUODENOSCOPY N/A 04/09/2013   Procedure: ESOPHAGOGASTRODUODENOSCOPY (EGD) with possible Balloon Dilation;  Surgeon: Gladis MARLA Louder, MD;  Location: [facility] ENDOSCOPY;  Service: Endoscopy;  Laterality: N/A;   ESOPHAGOGASTRODUODENOSCOPY (EGD) WITH PROPOFOL  N/A 01/24/2022   Procedure: ESOPHAGOGASTRODUODENOSCOPY (EGD) WITH PROPOFOL ;  Surgeon: Legrand Victory LITTIE DOUGLAS, MD;  Location: [facility] ENDOSCOPY;  Service: Gastroenterology;  Laterality: N/A;   HEMOSTASIS CLIP PLACEMENT  01/24/2022   Procedure: HEMOSTASIS CLIP PLACEMENT;  Surgeon: Legrand Victory LITTIE DOUGLAS, MD;  Location: [facility] ENDOSCOPY;  Service: Gastroenterology;;   kidney stone removal     multiple   KNEE ARTHROSCOPY Bilateral 1979   LAPAROSCOPIC RETROPUBIC PROSTATECTOMY  02/2007   hx prostate cancer   LEFT HEART CATH AND CORONARY ANGIOGRAPHY N/A 07/05/2023   Procedure: LEFT HEART CATH AND CORONARY ANGIOGRAPHY;  Surgeon: Swaziland, Peter M, MD;  Location: [facility] INVASIVE CV LAB;  Service: Cardiovascular;  Laterality: N/A;   ORIF ANKLE FRACTURE Right 10/2015   ORIF ANKLE FRACTURE Right 11/03/2015   Procedure: ORIF RIGHT LISFRANK FOOT;  Surgeon: Jerona LULLA Sage, MD;  Location: [facility] OR;  Service: Orthopedics;  Laterality: Right;   SUBMUCOSAL INJECTION  01/24/2022   Procedure: SUBMUCOSAL INJECTION;  Surgeon: Legrand Victory LITTIE DOUGLAS, MD;  Location: [facility] ENDOSCOPY;  Service: Gastroenterology;;   TEE WITHOUT CARDIOVERSION  08/25/2011   Procedure: TRANSESOPHAGEAL ECHOCARDIOGRAM (TEE);  Surgeon: Candyce CANDIE Reek, MD;  Location: [facility] ENDOSCOPY;  Service: Cardiovascular;  Laterality: N/A;   TEE WITHOUT CARDIOVERSION  11/09/2011   Procedure: TRANSESOPHAGEAL ECHOCARDIOGRAM (TEE);  Surgeon: Candyce CANDIE Reek, MD;  Location: [facility] ENDOSCOPY;   Service: Cardiovascular;  Laterality: N/A;  h/p in file drawer   TEE WITHOUT CARDIOVERSION N/A 02/05/2013   Procedure: TRANSESOPHAGEAL ECHOCARDIOGRAM (TEE);  Surgeon: Oneil Parchment, MD;  Location: [facility] ENDOSCOPY;  Service: Cardiovascular;  Laterality: N/A;   TEE WITHOUT CARDIOVERSION N/A 03/25/2020   Procedure: TRANSESOPHAGEAL ECHOCARDIOGRAM (TEE);  Surgeon: Raford Riggs, MD;  Location: [facility] ENDOSCOPY;  Service: Cardiovascular;  Laterality: N/A;   TOTAL THYROIDECTOMY  2009   benign nodules removed   UPPER GASTROINTESTINAL ENDOSCOPY     WRIST SURGERY Right 1977   with placement of lunate prosthesis    Prior to Admission medications   Medication Sig Start Date End Date Taking? Authorizing Provider  acetaminophen  (TYLENOL ) 500 MG tablet Take 1,000 mg by mouth every 6 (six) hours as needed for moderate pain or headache.   Yes [provider]  carvedilol  (COREG ) 12.5 MG tablet TAKE 3 TABLETS TWICE A DAY 03/28/23  Yes Raford Riggs, MD  dronedarone  (MULTAQ ) 400 MG tablet Take 1 tablet (400 mg total) by mouth 2 (two) times daily with a meal. 03/24/22  Yes Raford Riggs, MD  DULoxetine  (CYMBALTA ) 60 MG  capsule Take 60 mg by mouth daily. 03/27/20  Yes [provider]  esomeprazole (NEXIUM) 40 MG capsule Take 40 mg by mouth daily. 06/16/22  Yes [provider]  JARDIANCE  25 MG TABS tablet Take 25 mg by mouth daily. 06/12/19  Yes [provider]  levothyroxine  (SYNTHROID ) 175 MCG tablet Take 175 mcg by mouth daily. 07/01/19  Yes [provider]  losartan  (COZAAR ) 50 MG tablet TAKE 1 TABLET EVERY EVENING 11/07/23  Yes Raford Riggs, MD  metFORMIN  (GLUCOPHAGE -XR) 500 MG 24 hr tablet Take 1,000 mg by mouth 2 (two) times daily. 07/15/19  Yes [provider]  omeprazole (PRILOSEC) 40 MG capsule Take 40 mg by mouth daily.   Yes [provider]  Probiotic Product (PROBIOTIC PO) Take 1 capsule by mouth at bedtime.   Yes [provider]   rosuvastatin  (CRESTOR ) 10 MG tablet Take 1 tablet (10 mg total) by mouth daily. 03/15/22  Yes Raford Riggs, MD  albuterol  (VENTOLIN  HFA) 108 [phone] Base) MCG/ACT inhaler Inhale 2 puffs into the lungs every 6 (six) hours as needed for wheezing or shortness of breath. 02/27/23   Gonfa, Taye T, MD  apixaban  (ELIQUIS ) 5 MG TABS tablet Take 1 tablet by mouth twice daily 11/15/23   Raford Riggs, MD  Dulaglutide  (TRULICITY ) 1.5 MG/0.5ML SOPN Inject 1.5 mg into the skin once a week. 05/19/22     folic acid  (FOLVITE ) 1 MG tablet Take 1 mg by mouth daily.    [provider]  Glucosamine HCl 1500 MG TABS Take 1,500 mg by mouth 2 (two) times daily.    [provider]  iron polysaccharides (NIFEREX) 150 MG capsule Take 150 mg by mouth daily.    [provider]  Multiple Vitamin (MULTIVITAMIN WITH MINERALS) TABS tablet Take 1 tablet by mouth daily. One-A-Day for Men    [provider]  Multiple Vitamins-Minerals (PRESERVISION AREDS 2) CAPS Take 1 capsule by mouth 2 (two) times daily.    [provider]  nitroGLYCERIN  (NITROSTAT ) 0.4 MG SL tablet Place 0.4 mg under the tongue every 5 (five) minutes as needed for chest pain.    [provider]  oxyCODONE -acetaminophen  (PERCOCET/ROXICET) 5-325 MG tablet Take 1-2 tablets by mouth every 6 (six) hours as needed for moderate pain (pain score 4-6). 04/14/23   [provider]  vitamin B-12 (CYANOCOBALAMIN ) 1000 MCG tablet Take 1,000 mcg by mouth daily.    [provider]  Vitamin D , Ergocalciferol , (DRISDOL) 1.25 MG (50000 UNIT) CAPS capsule Take 50,000 Units by mouth every 30 (thirty) days. 01/04/22   [provider]    Current Outpatient Medications  Medication Sig Dispense Refill   acetaminophen  (TYLENOL ) 500 MG tablet Take 1,000 mg by mouth every 6 (six) hours as needed for moderate pain or headache.     carvedilol  (COREG ) 12.5 MG tablet TAKE 3 TABLETS TWICE A DAY 540 tablet 3    dronedarone  (MULTAQ ) 400 MG tablet Take 1 tablet (400 mg total) by mouth 2 (two) times daily with a meal. 180 tablet 3   DULoxetine  (CYMBALTA ) 60 MG capsule Take 60 mg by mouth daily.     esomeprazole (NEXIUM) 40 MG capsule Take 40 mg by mouth daily.     JARDIANCE  25 MG TABS tablet Take 25 mg by mouth daily.     levothyroxine  (SYNTHROID ) 175 MCG tablet Take 175 mcg by mouth daily.     losartan  (COZAAR ) 50 MG tablet TAKE 1 TABLET EVERY EVENING 90 tablet 1   metFORMIN  (  GLUCOPHAGE -XR) 500 MG 24 hr tablet Take 1,000 mg by mouth 2 (two) times daily.     omeprazole (PRILOSEC) 40 MG capsule Take 40 mg by mouth daily.     Probiotic Product (PROBIOTIC PO) Take 1 capsule by mouth at bedtime.     rosuvastatin  (CRESTOR ) 10 MG tablet Take 1 tablet (10 mg total) by mouth daily. 90 tablet 1   albuterol  (VENTOLIN  HFA) 108 (90 Base) MCG/ACT inhaler Inhale 2 puffs into the lungs every 6 (six) hours as needed for wheezing or shortness of breath. 8 g 2   apixaban  (ELIQUIS ) 5 MG TABS tablet Take 1 tablet by mouth twice daily 180 tablet 1   Dulaglutide  (TRULICITY ) 1.5 MG/0.5ML SOPN Inject 1.5 mg into the skin once a week. 2 mL 11   folic acid  (FOLVITE ) 1 MG tablet Take 1 mg by mouth daily.     Glucosamine HCl 1500 MG TABS Take 1,500 mg by mouth 2 (two) times daily.     iron polysaccharides (NIFEREX) 150 MG capsule Take 150 mg by mouth daily.     Multiple Vitamin (MULTIVITAMIN WITH MINERALS) TABS tablet Take 1 tablet by mouth daily. One-A-Day for Men     Multiple Vitamins-Minerals (PRESERVISION AREDS 2) CAPS Take 1 capsule by mouth 2 (two) times daily.     nitroGLYCERIN  (NITROSTAT ) 0.4 MG SL tablet Place 0.4 mg under the tongue every 5 (five) minutes as needed for chest pain.     oxyCODONE -acetaminophen  (PERCOCET/ROXICET) 5-325 MG tablet Take 1-2 tablets by mouth every 6 (six) hours as needed for moderate pain (pain score 4-6).     vitamin B-12 (CYANOCOBALAMIN ) 1000 MCG tablet Take 1,000 mcg by mouth daily.      Vitamin D , Ergocalciferol , (DRISDOL) 1.25 MG (50000 UNIT) CAPS capsule Take 50,000 Units by mouth every 30 (thirty) days.     Current Facility-Administered Medications  Medication Dose Route Frequency Provider Last Rate Last Admin   0.9 %  sodium chloride  infusion  500 mL Intravenous Once Alleyne Lac, Gordy HERO, MD        Allergies as of 01/25/2024 - Review Complete 01/25/2024  Allergen Reaction Noted   Beta adrenergic blockers Swelling and Other (See Comments) 05/13/2020   Bystolic [nebivolol hcl] Swelling and Other (See Comments) 08/25/2011   Calcium  channel blockers Swelling and Other (See Comments) 08/25/2011   Cardizem  [diltiazem ] Other (See Comments) 03/23/2020   Feraheme [ferumoxytol ] Shortness Of Breath, Nausea And Vomiting, and Other (See Comments) 02/08/2022   Norvasc [amlodipine] Other (See Comments) 05/13/2020   Phenergan [promethazine hcl] Itching and Other (See Comments) 08/25/2011   Aspirin  Other (See Comments) 08/25/2011   Nsaids Other (See Comments) 08/25/2011   Prednisone  Other (See Comments) 08/25/2011    Family History  Problem Relation Age of Onset   Emphysema Mother    Lung cancer Father    Diabetes Brother    Atrial fibrillation Brother    Colon polyps Brother    Throat cancer Sister    Colon polyps Sister    Lung cancer Sister    Colon polyps Sister    Colon cancer Neg Hx    Esophageal cancer Neg Hx    Rectal cancer Neg Hx    Stomach cancer Neg Hx     Social History   Socioeconomic History   Marital status: Married    Spouse name: Not on file   Number of children: Not on file   Years of education: Not on file   Highest education level: Not on file  Occupational  History   Not on file  Tobacco Use   Smoking status: Never   Smokeless tobacco: Never  Vaping Use   Vaping status: Never Used  Substance and Sexual Activity   Alcohol  use: No   Drug use: No   Sexual activity: Yes  Other Topics Concern   Not on file  Social History Narrative   Lives in  [place].  Works as a Merchandiser, retail at [employer]: Not on [employer] Insecurity: No Food Insecurity (07/04/2023)   Hunger Vital Sign    Worried About Running Out of Food in the Last Year: Never true    Ran Out of Food in the Last Year: Never true  Transportation Needs: No Transportation Needs (07/04/2023)   PRAPARE - Administrator, Civil Service (Medical): No    Lack of Transportation (Non-Medical): No  Physical Activity: Not on file  Stress: Not on file  Social Connections: Socially Integrated (07/04/2023)   Social Connection and Isolation Panel    Frequency of Communication with Friends and Family: Twice a week    Frequency of Social Gatherings with Friends and Family: More than three times a week    Attends Religious Services: More than 4 times per year    Active Member of [employer] or Organizations: Yes    Attends Banker Meetings: 1 to 4 times per year    Marital Status: Married  Catering manager Violence: Not At Risk (07/04/2023)   Humiliation, Afraid, Rape, and Kick questionnaire    Fear of Current or Ex-Partner: No    Emotionally Abused: No    Physically Abused: No    Sexually Abused: No    Physical Exam: Vital signs in last 24 hours: @BP  (!) 168/108   Pulse 66   Temp 98.1 F (36.7 C) (Temporal)   Ht 5' 7 (1.702 m)   Wt 219 lb (99.3 kg)   SpO2 99%   BMI 34.30 kg/m  GEN: NAD EYE: Sclerae anicteric ENT: MMM CV: Non-tachycardic Pulm: CTA b/l GI: Soft, NT/ND NEURO:  Alert & Oriented x 3   Gordy Starch, MD [facility]  01/25/2024 7:57 AM

## 2024-01-26 ENCOUNTER — Telehealth: Payer: Self-pay

## 2024-01-26 NOTE — Telephone Encounter (Signed)
  Follow up Call-     01/25/2024    7:13 AM 01/17/2022    9:59 AM  Call back number  Post procedure Call Back phone  # [phone]  Permission to leave phone message Yes Yes     Patient questions:  Do you have a fever, pain , or abdominal swelling? No. Pain Score  0 *  Have you tolerated food without any problems? Yes.    Have you been able to return to your normal activities? Yes.    Do you have any questions about your discharge instructions: Diet   No. Medications  No. Follow up visit  No.  Do you have questions or concerns about your Care? No.  Actions: * If pain score is 4 or above: No action needed, pain <4.

## 2024-01-29 ENCOUNTER — Ambulatory Visit: Payer: Self-pay | Admitting: Internal Medicine

## 2024-01-29 LAB — SURGICAL PATHOLOGY

## 2024-02-27 DIAGNOSIS — D509 Iron deficiency anemia, unspecified: Secondary | ICD-10-CM | POA: Diagnosis not present

## 2024-02-27 DIAGNOSIS — Z1212 Encounter for screening for malignant neoplasm of rectum: Secondary | ICD-10-CM | POA: Diagnosis not present

## 2024-03-05 DIAGNOSIS — I10 Essential (primary) hypertension: Secondary | ICD-10-CM | POA: Diagnosis not present

## 2024-03-05 DIAGNOSIS — R82998 Other abnormal findings in urine: Secondary | ICD-10-CM | POA: Diagnosis not present

## 2024-03-05 DIAGNOSIS — E1129 Type 2 diabetes mellitus with other diabetic kidney complication: Secondary | ICD-10-CM | POA: Diagnosis not present

## 2024-03-06 DIAGNOSIS — Z23 Encounter for immunization: Secondary | ICD-10-CM | POA: Diagnosis not present

## 2024-03-11 IMAGING — CT CT RENAL STONE PROTOCOL
2 of 4 series · 16 of 46 positions shown, 18 images · non-contrast
Comparison: CT stone 07/26/2021

CLINICAL DATA: Flank pain.



[Series 2: axial st · axial · 0.89mm/px · z∈[-401,-21]mm · 13 of 88 slices shown, 15 images]
[im 6/88  soft-tissue]
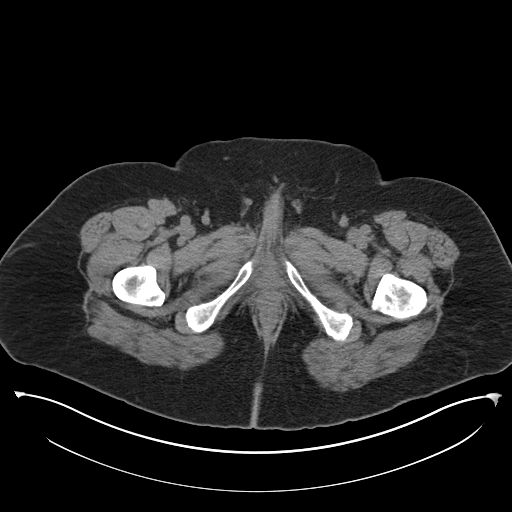
[im 6/88  bone]
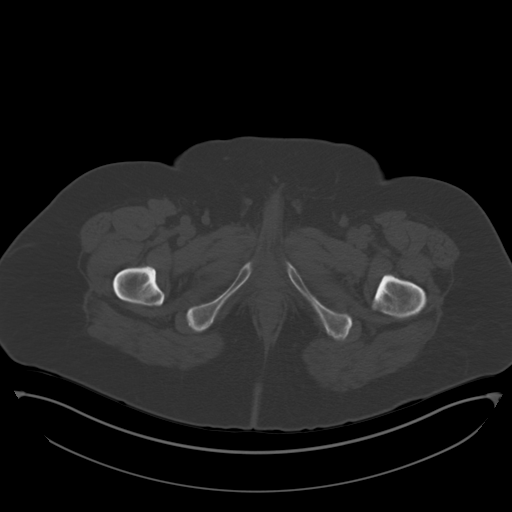
[im 12/88  soft-tissue]
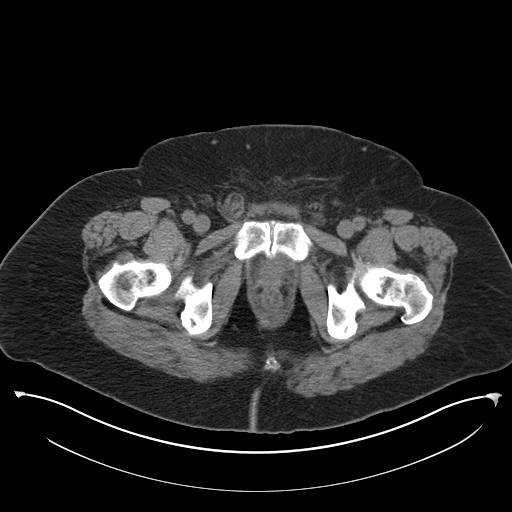
[im 18/88  soft-tissue]
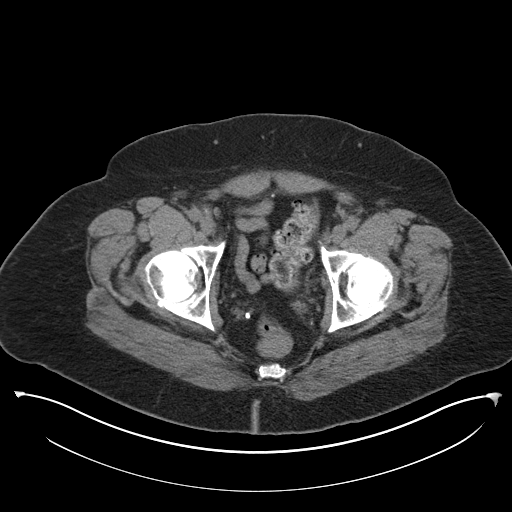
[im 24/88  soft-tissue]
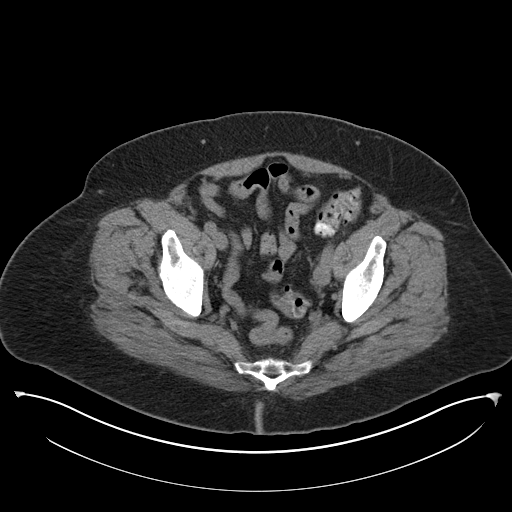
[im 30/88  soft-tissue]
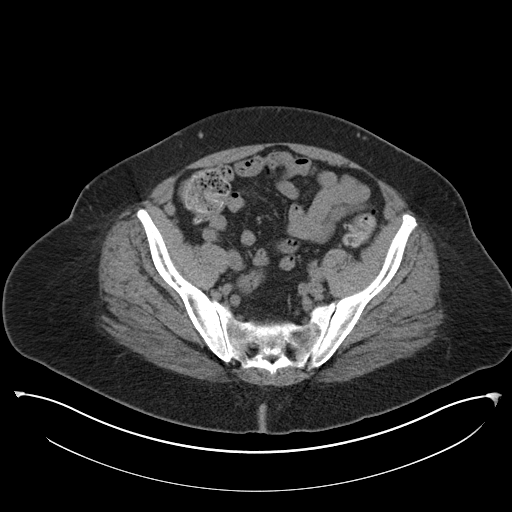
[im 35/88  soft-tissue]
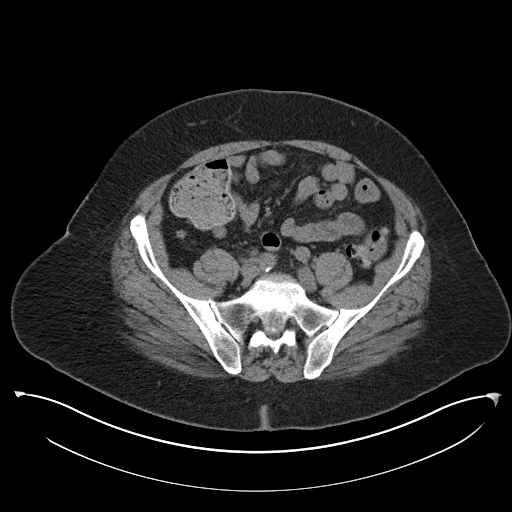
[im 47/88  soft-tissue]
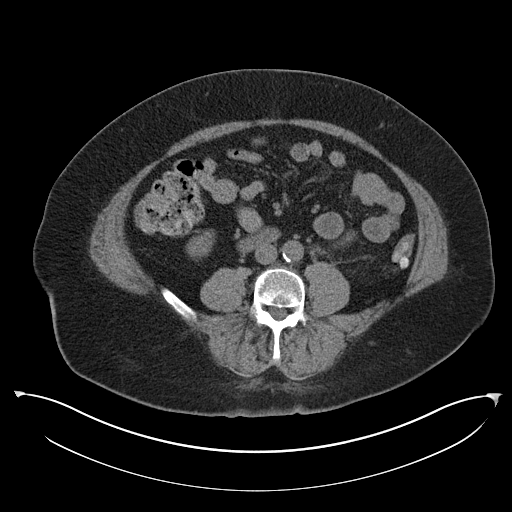
[im 53/88  soft-tissue]
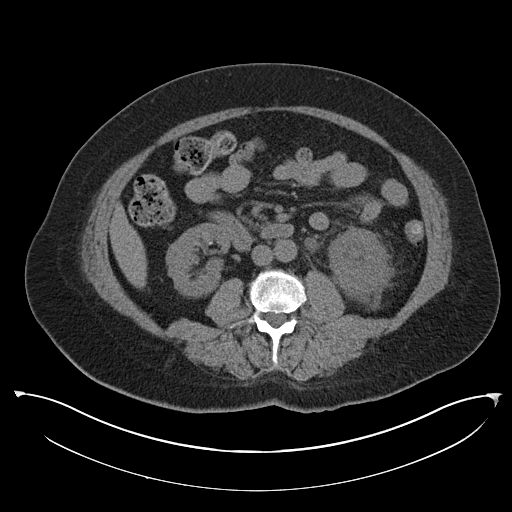
[im 59/88  soft-tissue]
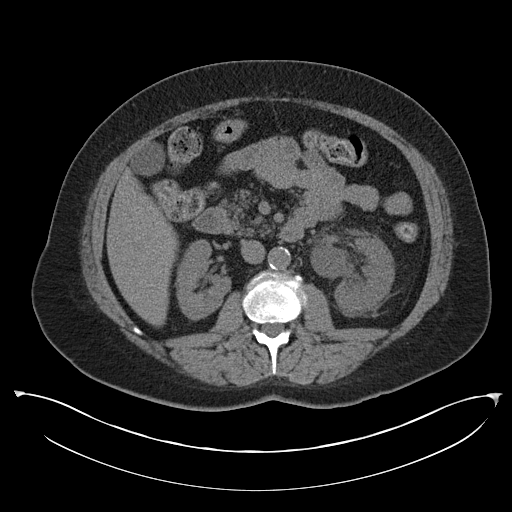
[im 59/88  bone]
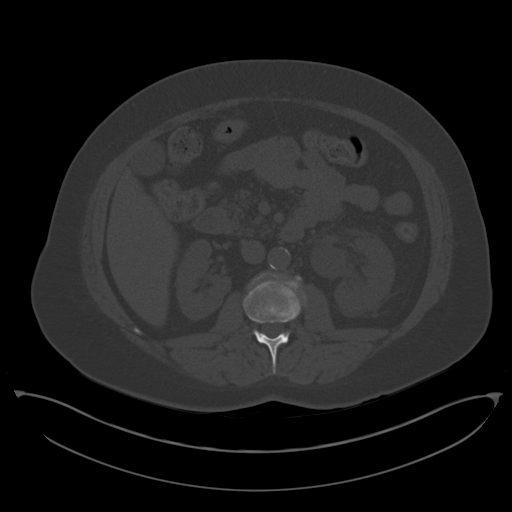
[im 64/88  soft-tissue]
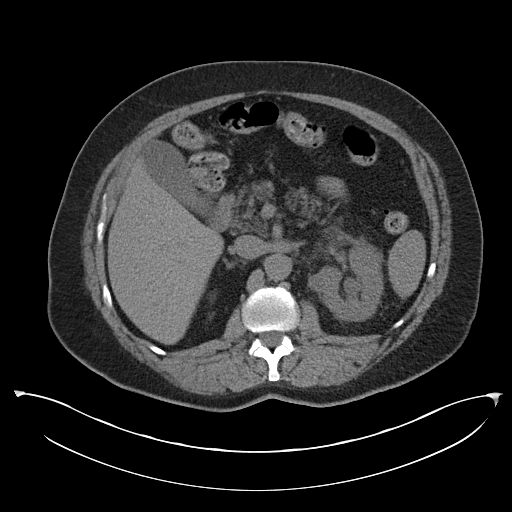
[im 70/88  soft-tissue]
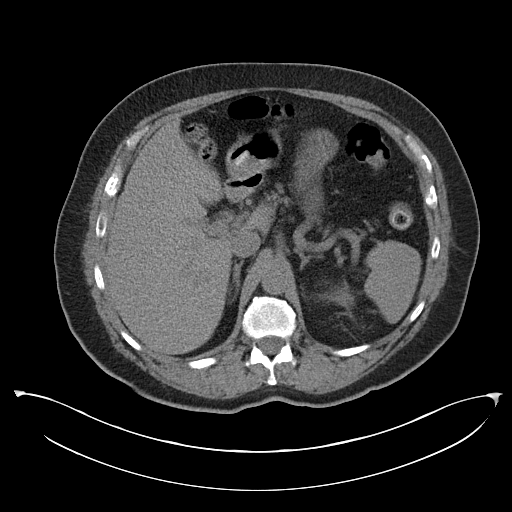
[im 76/88  soft-tissue]
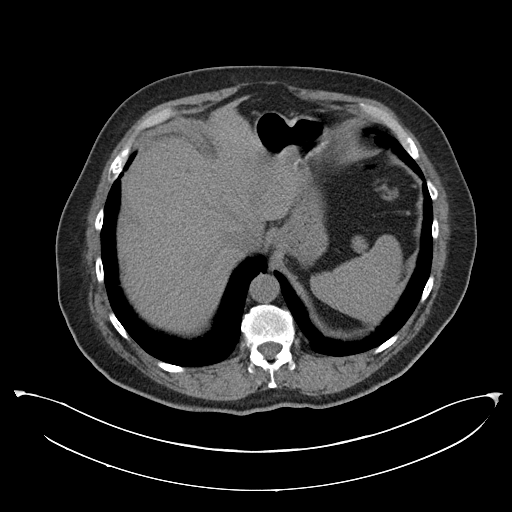
[im 82/88  soft-tissue]
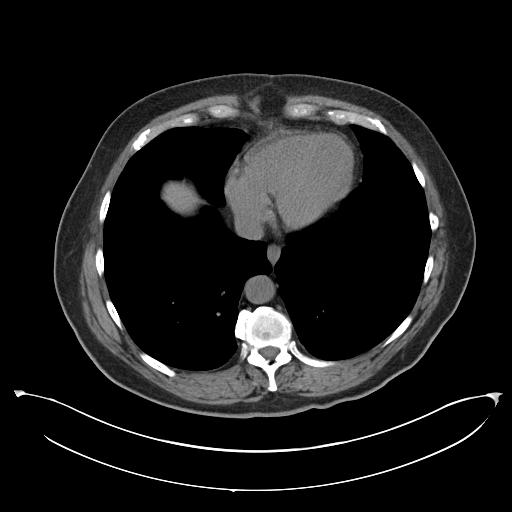

[Series 5: coronal · coronal · 0.84mm/px · 3 of 169 slices shown]
[im 57/169  soft-tissue]
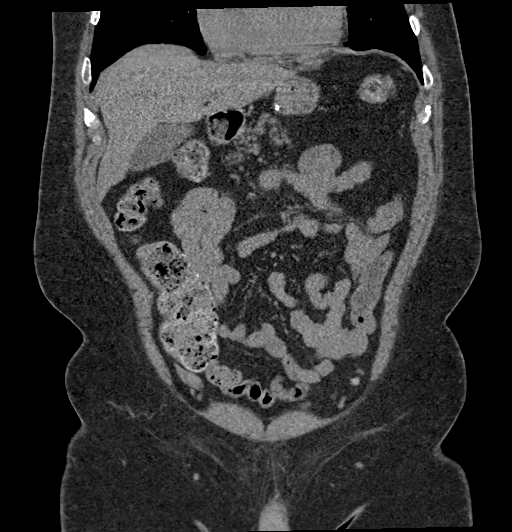
[im 75/169  soft-tissue]
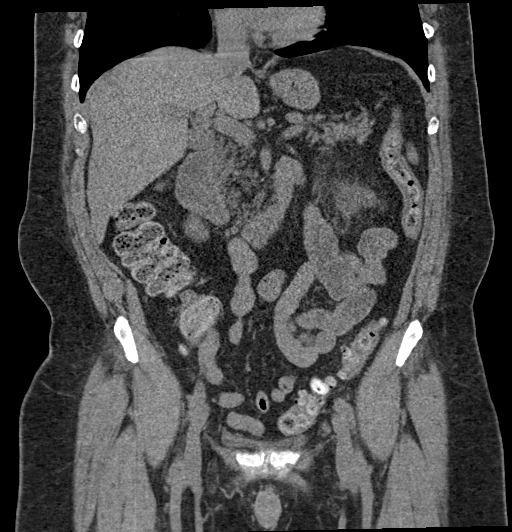
[im 94/169  soft-tissue]
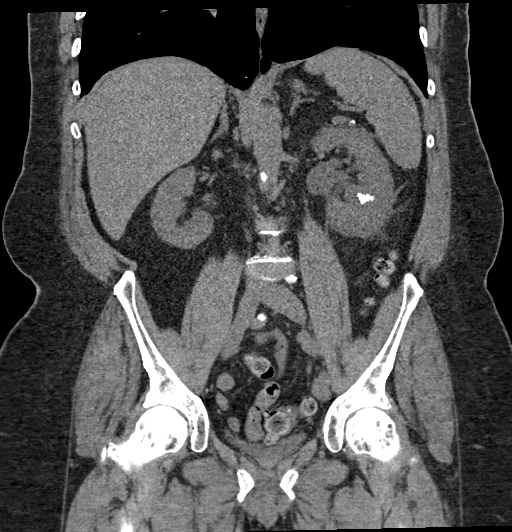

[16 of 46 positions shown; findings below may reference images not displayed]

FINDINGS: Lower chest: No acute abnormality.

Hepatobiliary: Small gallstones are present. There is no biliary
ductal dilatation. Liver is within normal limits.

Pancreas: Unremarkable. No pancreatic ductal dilatation or
surrounding inflammatory changes.

Spleen: Normal in size without focal abnormality.

Adrenals/Urinary Tract: There is a 3 mm calculus in the proximal
left ureter. There is moderate left-sided hydronephrosis and
perinephric fat stranding. There are additional bilateral
nonobstructing renal calculi, left greater than right. Adrenal gland
is within normal limits. The bladder is decompressed.

Stomach/Bowel: Stomach is within normal limits. Appendix appears
normal. No evidence of bowel wall thickening, distention, or
inflammatory changes. There is sigmoid and descending colon
diverticulosis without evidence for acute diverticulitis.

Vascular/Lymphatic: Aortic atherosclerosis. No enlarged abdominal or
pelvic lymph nodes.

Reproductive: Prostate gland is small or absent.

Other: Small right inguinal hernia containing a portion of the
bladder. No ascites or free air.

Musculoskeletal: No acute fracture. There are sclerotic changes in
the bilateral femoral heads, unchanged which may represent avascular
necrosis. There is no evidence for femoral head collapse.
IMPRESSION: 1. 3 mm calculus in the proximal left ureter with moderate
obstructive uropathy.
2. Additional nonobstructing bilateral renal calculi.
3. Sigmoid colon diverticulosis.
4. Right inguinal hernia containing a small portion of the bladder.
5. Cholelithiasis.
6. Stable chronic findings of bilateral femoral head avascular
necrosis.

## 2024-03-14 MED ORDER — MULTAQ 400 MG PO TABS: 400.0000 mg | ORAL_TABLET | Freq: Two times a day (BID) | ORAL | 3 refills | Status: AC

## 2024-03-14 NOTE — Telephone Encounter (Signed)
 Forms placed with Dr Skeeter papers to be signed

## 2024-03-14 NOTE — Addendum Note (Signed)
 Addended by: FREDIRICK BEAU B on: 03/14/2024 06:13 PM   Modules accepted: Orders

## 2024-03-18 DIAGNOSIS — Z85820 Personal history of malignant melanoma of skin: Secondary | ICD-10-CM | POA: Diagnosis not present

## 2024-03-18 DIAGNOSIS — D225 Melanocytic nevi of trunk: Secondary | ICD-10-CM | POA: Diagnosis not present

## 2024-03-18 DIAGNOSIS — Z85828 Personal history of other malignant neoplasm of skin: Secondary | ICD-10-CM | POA: Diagnosis not present

## 2024-03-18 DIAGNOSIS — L905 Scar conditions and fibrosis of skin: Secondary | ICD-10-CM | POA: Diagnosis not present

## 2024-03-18 DIAGNOSIS — D2261 Melanocytic nevi of right upper limb, including shoulder: Secondary | ICD-10-CM | POA: Diagnosis not present

## 2024-03-18 DIAGNOSIS — D1801 Hemangioma of skin and subcutaneous tissue: Secondary | ICD-10-CM | POA: Diagnosis not present

## 2024-03-18 NOTE — Telephone Encounter (Signed)
 Forms signed and mychart message sent to patient  His portion of forms at front to be reviewed/completed and signed

## 2024-03-20 ENCOUNTER — Encounter (HOSPITAL_BASED_OUTPATIENT_CLINIC_OR_DEPARTMENT_OTHER): Payer: Self-pay | Admitting: Cardiovascular Disease

## 2024-03-22 ENCOUNTER — Other Ambulatory Visit (HOSPITAL_BASED_OUTPATIENT_CLINIC_OR_DEPARTMENT_OTHER): Payer: Self-pay | Admitting: Cardiovascular Disease

## 2024-05-06 ENCOUNTER — Other Ambulatory Visit (HOSPITAL_BASED_OUTPATIENT_CLINIC_OR_DEPARTMENT_OTHER): Payer: Self-pay | Admitting: Cardiovascular Disease

## 2024-05-14 ENCOUNTER — Other Ambulatory Visit (HOSPITAL_BASED_OUTPATIENT_CLINIC_OR_DEPARTMENT_OTHER): Payer: Self-pay | Admitting: Cardiovascular Disease

## 2024-05-14 DIAGNOSIS — I480 Paroxysmal atrial fibrillation: Secondary | ICD-10-CM

## 2024-05-14 NOTE — Telephone Encounter (Signed)
 Pt last saw Scot Ford, [place] on 07/26/23, last labs 07/05/23 Creat 1.28, age 74, weight 99.3kg, based on specified criteria pt is on appropriate dosage of Eliquis  5mg  BID for afib.  Will refill rx.

## 2024-05-15 ENCOUNTER — Ambulatory Visit: Admitting: Physician Assistant

## 2024-09-03 ENCOUNTER — Ambulatory Visit (HOSPITAL_BASED_OUTPATIENT_CLINIC_OR_DEPARTMENT_OTHER): Admitting: Cardiovascular Disease
